# Patient Record
Sex: Female | Born: 1945
Health system: Southern US, Community
[De-identification: ages and names within clinical notes are randomized; demographics above are authoritative.]

## PROBLEM LIST (undated history)

## (undated) DIAGNOSIS — M75120 Complete rotator cuff tear or rupture of unspecified shoulder, not specified as traumatic: Secondary | ICD-10-CM

## (undated) DIAGNOSIS — G589 Mononeuropathy, unspecified: Secondary | ICD-10-CM

## (undated) DIAGNOSIS — G8929 Other chronic pain: Secondary | ICD-10-CM

## (undated) DIAGNOSIS — C801 Malignant (primary) neoplasm, unspecified: Secondary | ICD-10-CM

## (undated) DIAGNOSIS — M5030 Other cervical disc degeneration, unspecified cervical region: Secondary | ICD-10-CM

## (undated) DIAGNOSIS — M48062 Spinal stenosis, lumbar region with neurogenic claudication: Secondary | ICD-10-CM

## (undated) DIAGNOSIS — M533 Sacrococcygeal disorders, not elsewhere classified: Secondary | ICD-10-CM

## (undated) DIAGNOSIS — Z87440 Personal history of urinary (tract) infections: Secondary | ICD-10-CM

## (undated) DIAGNOSIS — J45909 Unspecified asthma, uncomplicated: Secondary | ICD-10-CM

## (undated) DIAGNOSIS — F329 Major depressive disorder, single episode, unspecified: Secondary | ICD-10-CM

## (undated) DIAGNOSIS — M47816 Spondylosis without myelopathy or radiculopathy, lumbar region: Secondary | ICD-10-CM

## (undated) DIAGNOSIS — M5136 Other intervertebral disc degeneration, lumbar region: Secondary | ICD-10-CM

## (undated) DIAGNOSIS — M19019 Primary osteoarthritis, unspecified shoulder: Secondary | ICD-10-CM

## (undated) DIAGNOSIS — R51 Headache: Secondary | ICD-10-CM

## (undated) DIAGNOSIS — F419 Anxiety disorder, unspecified: Secondary | ICD-10-CM

## (undated) DIAGNOSIS — M4810 Ankylosing hyperostosis [Forestier], site unspecified: Secondary | ICD-10-CM

## (undated) DIAGNOSIS — M79605 Pain in left leg: Secondary | ICD-10-CM

## (undated) DIAGNOSIS — M25511 Pain in right shoulder: Secondary | ICD-10-CM

## (undated) DIAGNOSIS — M5126 Other intervertebral disc displacement, lumbar region: Secondary | ICD-10-CM

## (undated) DIAGNOSIS — R0600 Dyspnea, unspecified: Secondary | ICD-10-CM

## (undated) DIAGNOSIS — K219 Gastro-esophageal reflux disease without esophagitis: Secondary | ICD-10-CM

## (undated) DIAGNOSIS — C3490 Malignant neoplasm of unspecified part of unspecified bronchus or lung: Secondary | ICD-10-CM

## (undated) DIAGNOSIS — F32A Depression, unspecified: Secondary | ICD-10-CM

## (undated) DIAGNOSIS — M1990 Unspecified osteoarthritis, unspecified site: Secondary | ICD-10-CM

## (undated) DIAGNOSIS — M79603 Pain in arm, unspecified: Secondary | ICD-10-CM

## (undated) DIAGNOSIS — M542 Cervicalgia: Secondary | ICD-10-CM

## (undated) DIAGNOSIS — M79604 Pain in right leg: Secondary | ICD-10-CM

## (undated) DIAGNOSIS — M19011 Primary osteoarthritis, right shoulder: Secondary | ICD-10-CM

## (undated) DIAGNOSIS — C189 Malignant neoplasm of colon, unspecified: Secondary | ICD-10-CM

## (undated) DIAGNOSIS — I10 Essential (primary) hypertension: Secondary | ICD-10-CM

## (undated) DIAGNOSIS — M4310 Spondylolisthesis, site unspecified: Secondary | ICD-10-CM

## (undated) DIAGNOSIS — R519 Headache, unspecified: Secondary | ICD-10-CM

## (undated) DIAGNOSIS — M4802 Spinal stenosis, cervical region: Secondary | ICD-10-CM

## (undated) HISTORY — PX: APPENDECTOMY: SHX54

## (undated) HISTORY — PX: ABDOMINAL HYSTERECTOMY: SHX81

## (undated) HISTORY — DX: Spinal stenosis, lumbar region with neurogenic claudication: M48.062

## (undated) HISTORY — DX: Pain in left leg: M79.605

## (undated) HISTORY — DX: Other cervical disc degeneration, unspecified cervical region: M50.30

## (undated) HISTORY — DX: Spondylosis without myelopathy or radiculopathy, lumbar region: M47.816

## (undated) HISTORY — DX: Other intervertebral disc displacement, lumbar region: M51.26

## (undated) HISTORY — PX: REDUCTION MAMMAPLASTY: SUR839

## (undated) HISTORY — DX: Spondylolisthesis, site unspecified: M43.10

## (undated) HISTORY — DX: Other intervertebral disc degeneration, lumbar region: M51.36

## (undated) HISTORY — DX: Pain in right leg: M79.604

## (undated) HISTORY — DX: Ankylosing hyperostosis (forestier), site unspecified: M48.10

## (undated) HISTORY — DX: Mononeuropathy, unspecified: G58.9

## (undated) HISTORY — DX: Sacrococcygeal disorders, not elsewhere classified: M53.3

## (undated) HISTORY — DX: Malignant neoplasm of colon, unspecified: C18.9

## (undated) HISTORY — DX: Complete rotator cuff tear or rupture of unspecified shoulder, not specified as traumatic: M75.120

## (undated) HISTORY — DX: Primary osteoarthritis, right shoulder: M19.011

## (undated) HISTORY — DX: Spinal stenosis, cervical region: M48.02

## (undated) HISTORY — DX: Pain in right shoulder: M25.511

## (undated) HISTORY — DX: Primary osteoarthritis, unspecified shoulder: M19.019

## (undated) HISTORY — DX: Pain in arm, unspecified: M79.603

## (undated) HISTORY — DX: Cervicalgia: M54.2

## (undated) HISTORY — PX: COLOSTOMY: SHX63

## (undated) HISTORY — PX: DILATION AND CURETTAGE OF UTERUS: SHX78

## (undated) HISTORY — PX: BREAST REDUCTION SURGERY: SHX8

## (undated) HISTORY — DX: Other chronic pain: G89.29

---
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
## 2000-11-12 ENCOUNTER — Ambulatory Visit (HOSPITAL_BASED_OUTPATIENT_CLINIC_OR_DEPARTMENT_OTHER): Admission: RE | Admit: 2000-11-12 | Discharge: 2000-11-12 | Payer: Self-pay | Admitting: Orthopedic Surgery

## 2001-01-10 ENCOUNTER — Ambulatory Visit (HOSPITAL_BASED_OUTPATIENT_CLINIC_OR_DEPARTMENT_OTHER): Admission: RE | Admit: 2001-01-10 | Discharge: 2001-01-10 | Payer: Self-pay | Admitting: Orthopedic Surgery

## 2003-09-03 ENCOUNTER — Other Ambulatory Visit: Payer: Self-pay

## 2004-10-20 ENCOUNTER — Emergency Department: Payer: Self-pay | Admitting: Emergency Medicine

## 2004-12-21 ENCOUNTER — Ambulatory Visit: Payer: Self-pay | Admitting: Internal Medicine

## 2005-07-14 ENCOUNTER — Other Ambulatory Visit: Payer: Self-pay

## 2005-07-14 ENCOUNTER — Emergency Department: Payer: Self-pay | Admitting: General Practice

## 2005-08-01 ENCOUNTER — Other Ambulatory Visit: Payer: Self-pay

## 2005-08-01 ENCOUNTER — Emergency Department: Payer: Self-pay | Admitting: Emergency Medicine

## 2005-12-07 ENCOUNTER — Ambulatory Visit: Payer: Self-pay | Admitting: Internal Medicine

## 2006-02-28 ENCOUNTER — Ambulatory Visit: Payer: Self-pay | Admitting: Pain Medicine

## 2006-03-26 ENCOUNTER — Other Ambulatory Visit: Payer: Self-pay

## 2006-03-26 ENCOUNTER — Emergency Department: Payer: Self-pay | Admitting: Internal Medicine

## 2006-03-26 ENCOUNTER — Ambulatory Visit: Payer: Self-pay | Admitting: Pain Medicine

## 2006-04-02 ENCOUNTER — Ambulatory Visit: Payer: Self-pay | Admitting: Pain Medicine

## 2006-06-02 ENCOUNTER — Emergency Department: Payer: Self-pay | Admitting: Emergency Medicine

## 2006-11-10 ENCOUNTER — Emergency Department: Payer: Self-pay | Admitting: General Practice

## 2007-02-06 ENCOUNTER — Ambulatory Visit: Payer: Self-pay | Admitting: Internal Medicine

## 2007-03-02 ENCOUNTER — Emergency Department: Payer: Self-pay | Admitting: Emergency Medicine

## 2007-03-02 ENCOUNTER — Other Ambulatory Visit: Payer: Self-pay

## 2007-05-22 ENCOUNTER — Other Ambulatory Visit: Payer: Self-pay

## 2007-05-22 ENCOUNTER — Emergency Department: Payer: Self-pay | Admitting: Emergency Medicine

## 2007-12-09 ENCOUNTER — Emergency Department: Payer: Self-pay | Admitting: Internal Medicine

## 2008-01-18 ENCOUNTER — Other Ambulatory Visit: Payer: Self-pay

## 2008-01-18 ENCOUNTER — Emergency Department: Payer: Self-pay | Admitting: Emergency Medicine

## 2008-02-12 ENCOUNTER — Ambulatory Visit: Payer: Self-pay | Admitting: Internal Medicine

## 2008-04-15 ENCOUNTER — Emergency Department: Payer: Self-pay | Admitting: Unknown Physician Specialty

## 2008-06-13 ENCOUNTER — Emergency Department: Payer: Self-pay | Admitting: Emergency Medicine

## 2008-06-15 ENCOUNTER — Emergency Department: Payer: Self-pay | Admitting: Emergency Medicine

## 2008-10-21 ENCOUNTER — Ambulatory Visit: Payer: Self-pay | Admitting: Internal Medicine

## 2008-11-08 ENCOUNTER — Ambulatory Visit: Payer: Self-pay | Admitting: Gastroenterology

## 2008-11-18 ENCOUNTER — Ambulatory Visit: Payer: Self-pay | Admitting: Gastroenterology

## 2009-02-15 ENCOUNTER — Ambulatory Visit: Payer: Self-pay | Admitting: Internal Medicine

## 2009-03-13 ENCOUNTER — Inpatient Hospital Stay: Payer: Self-pay | Admitting: Internal Medicine

## 2009-04-12 ENCOUNTER — Ambulatory Visit: Payer: Self-pay | Admitting: Surgery

## 2009-04-15 ENCOUNTER — Ambulatory Visit: Payer: Self-pay | Admitting: Surgery

## 2009-12-19 ENCOUNTER — Emergency Department: Payer: Self-pay | Admitting: Emergency Medicine

## 2010-02-27 ENCOUNTER — Ambulatory Visit: Payer: Self-pay | Admitting: Internal Medicine

## 2011-03-01 ENCOUNTER — Ambulatory Visit: Payer: Self-pay | Admitting: Internal Medicine

## 2011-08-04 ENCOUNTER — Emergency Department: Payer: Self-pay | Admitting: *Deleted

## 2012-03-03 ENCOUNTER — Ambulatory Visit: Payer: Self-pay | Admitting: Internal Medicine

## 2013-03-04 ENCOUNTER — Ambulatory Visit: Payer: Self-pay | Admitting: Internal Medicine

## 2014-06-03 ENCOUNTER — Ambulatory Visit: Payer: Self-pay | Admitting: Internal Medicine

## 2015-04-01 DIAGNOSIS — H538 Other visual disturbances: Secondary | ICD-10-CM | POA: Insufficient documentation

## 2015-04-01 DIAGNOSIS — R51 Headache: Secondary | ICD-10-CM | POA: Insufficient documentation

## 2015-04-01 DIAGNOSIS — I10 Essential (primary) hypertension: Secondary | ICD-10-CM | POA: Diagnosis present

## 2015-04-01 NOTE — ED Notes (Signed)
Pt states has blurred vision, headache, hypertension for a month. Pt states has also had intermittent chest pain mid sternally "off and on for awhile".

## 2015-04-02 ENCOUNTER — Other Ambulatory Visit: Payer: Self-pay

## 2015-04-02 ENCOUNTER — Emergency Department
Admission: EM | Admit: 2015-04-02 | Discharge: 2015-04-02 | Disposition: A | Discharge status: Home / Self Care | Payer: Medicare HMO | Attending: Emergency Medicine | Admitting: Emergency Medicine

## 2015-04-02 ENCOUNTER — Emergency Department: Payer: Medicare HMO

## 2015-04-02 DIAGNOSIS — I10 Essential (primary) hypertension: Secondary | ICD-10-CM

## 2015-04-02 LAB — CBC
HCT: 37.4 % (ref 35.0–47.0)
HEMOGLOBIN: 12.1 g/dL (ref 12.0–16.0)
MCH: 28 pg (ref 26.0–34.0)
MCHC: 32.5 g/dL (ref 32.0–36.0)
MCV: 86.2 fL (ref 80.0–100.0)
Platelets: 306 10*3/uL (ref 150–440)
RBC: 4.33 MIL/uL (ref 3.80–5.20)
RDW: 14.2 % (ref 11.5–14.5)
WBC: 10.8 10*3/uL (ref 3.6–11.0)

## 2015-04-02 LAB — BASIC METABOLIC PANEL
Anion gap: 8 (ref 5–15)
BUN: 16 mg/dL (ref 6–20)
CALCIUM: 9.4 mg/dL (ref 8.9–10.3)
CHLORIDE: 104 mmol/L (ref 101–111)
CO2: 28 mmol/L (ref 22–32)
Creatinine, Ser: 0.75 mg/dL (ref 0.44–1.00)
GFR calc Af Amer: >60 mL/min (ref >60)
GFR calc non Af Amer: >60 mL/min (ref >60)
Glucose, Bld: 142 mg/dL — ABNORMAL HIGH (ref 65–99)
POTASSIUM: 4.1 mmol/L (ref 3.5–5.1)
Sodium: 140 mmol/L (ref 135–145)

## 2015-04-02 LAB — TROPONIN I

## 2015-04-02 MED ORDER — CLONIDINE HCL 0.1 MG PO TABS
ORAL_TABLET | ORAL | Status: AC
  Administered 2015-04-02: 0.1 mg via ORAL
  Filled 2015-04-02: qty 1

## 2015-04-02 MED ORDER — CLONIDINE HCL 0.1 MG PO TABS
0.1000 mg | ORAL_TABLET | Freq: Once | ORAL | Status: AC
  Administered 2015-04-02: 0.1 mg via ORAL

## 2015-04-02 MED ORDER — KETOROLAC TROMETHAMINE 10 MG PO TABS
10.0000 mg | ORAL_TABLET | Freq: Once | ORAL | Status: AC
  Administered 2015-04-02: 10 mg via ORAL

## 2015-04-02 MED ORDER — KETOROLAC TROMETHAMINE 10 MG PO TABS
ORAL_TABLET | ORAL | Status: AC
  Administered 2015-04-02: 10 mg via ORAL
  Filled 2015-04-02: qty 1

## 2015-04-02 NOTE — ED Notes (Signed)
Pt in today for B/P check. Pt seen yesterday at PCP for same and no medications given. Pt denies any current symptoms.

## 2015-04-02 NOTE — ED Notes (Signed)
Report to [place], rn.

## 2015-04-02 NOTE — Discharge Instructions (Signed)

## 2015-04-02 NOTE — ED Provider Notes (Signed)
[facility] Emergency Department Provider Note  ____________________________________________  Time seen: 2:45 AM  I have reviewed the triage vital signs and the nursing notes.   HISTORY  Chief Complaint Hypertension      HPI Gloria Allen is a 69 y.o. female presents with blurred vision headache and hypertension for one month area patient states that she saw her primary care physician Dr. Brynda Greathouse yesterday for the same.     Past medical history Hypertension  There are no active problems to display for this patient.      Allergies No known drug allergies  Social History Tobacco use none  Review of Systems  Constitutional: Negative for fever. Eyes: Negative for visual changes. ENT: Negative for sore throat. Cardiovascular: Negative for chest pain. Respiratory: Negative for shortness of breath. Gastrointestinal: Negative for abdominal pain, vomiting and diarrhea. Genitourinary: Negative for dysuria. Musculoskeletal: Negative for back pain. Skin: Negative for rash. Neurological: Negative for headaches, focal weakness or numbness.   10-point ROS otherwise negative.  ____________________________________________   PHYSICAL EXAM:  VITAL SIGNS: ED Triage Vitals  Enc Vitals Group     BP 04/01/15 2355 217/88 mmHg     Pulse Rate 04/01/15 2355 75     Resp 04/01/15 2355 18     Temp 04/01/15 2355 98.6 F (37 C)     Temp Source 04/01/15 2355 Oral     SpO2 04/01/15 2355 96 %     Weight 04/01/15 2355 162 lb (73.483 kg)     Height 04/01/15 2355 '5\' 4"'$  (1.626 m)     Head Cir --      Peak Flow --      Pain Score 04/01/15 2356 8     Pain Loc --      Pain Edu? --      Excl. in [place]? --      Constitutional: Alert and oriented. Well appearing and in no distress. Eyes: Conjunctivae are normal. PERRL. Normal extraocular movements. ENT   Head: Normocephalic and atraumatic.   Nose: No congestion/rhinnorhea.   Mouth/Throat: Mucous  membranes are moist.   Neck: No stridor. Cardiovascular: Normal rate, regular rhythm. Normal and symmetric distal pulses are present in all extremities. No murmurs, rubs, or gallops. Respiratory: Normal respiratory effort without tachypnea nor retractions. Breath sounds are clear and equal bilaterally. No wheezes/rales/rhonchi. Gastrointestinal: Soft and nontender. No distention. There is no CVA tenderness. Genitourinary: deferred Musculoskeletal: Nontender with normal range of motion in all extremities. No joint effusions.  No lower extremity tenderness nor edema. Neurologic:  Normal speech and language. No gross focal neurologic deficits are appreciated. Speech is normal.  Skin:  Skin is warm, dry and intact. No rash noted. Psychiatric: Mood and affect are normal. Speech and behavior are normal. Patient exhibits appropriate insight and judgment.  ____________________________________________    LABS (pertinent positives/negatives)  Labs Reviewed  BASIC METABOLIC PANEL - Abnormal; Notable for the following:    Glucose, Bld 142 (*)    All other components within normal limits  CBC  TROPONIN I     ____________________________________________   EKG   Date: 04/02/2015  Rate: 78  Rhythm: normal sinus rhythm  QRS Axis: normal  Intervals: normal  ST/T Wave abnormalities: normal  Conduction Disutrbances: none  Narrative Interpretation: unremarkable      ____________________________________________    RADIOLOGY  CT scan of the head revealed  IMPRESSION: 1. No acute intracranial process. 2. Mild chronic small vessel ischemic disease.  INITIAL IMPRESSION / ASSESSMENT AND PLAN /  ED COURSE  Pertinent labs & imaging results that were available during my care of the patient were reviewed by me and considered in my medical decision making (see chart for details).  History of physical exam consistent with uncontrolled hypertension as such patient received clonidine 0.1  mg. With improvement in blood pressure patient will be discharged home with outpatient follow-up with Dr. Brynda Greathouse ____________________________________________   FINAL CLINICAL IMPRESSION(S) / ED DIAGNOSES  Final diagnoses:  Uncontrolled hypertension      Gregor Hams, MD 04/02/15 [phone]

## 2015-06-13 ENCOUNTER — Encounter: Payer: Self-pay | Admitting: Emergency Medicine

## 2015-06-13 ENCOUNTER — Emergency Department
Admission: EM | Admit: 2015-06-13 | Discharge: 2015-06-13 | Disposition: A | Discharge status: Home / Self Care | Payer: Medicare HMO | Attending: Emergency Medicine | Admitting: Emergency Medicine

## 2015-06-13 ENCOUNTER — Emergency Department: Payer: Medicare HMO

## 2015-06-13 DIAGNOSIS — W19XXXA Unspecified fall, initial encounter: Secondary | ICD-10-CM

## 2015-06-13 DIAGNOSIS — T148XXA Other injury of unspecified body region, initial encounter: Secondary | ICD-10-CM

## 2015-06-13 DIAGNOSIS — S6992XA Unspecified injury of left wrist, hand and finger(s), initial encounter: Secondary | ICD-10-CM | POA: Diagnosis present

## 2015-06-13 DIAGNOSIS — S8992XA Unspecified injury of left lower leg, initial encounter: Secondary | ICD-10-CM | POA: Insufficient documentation

## 2015-06-13 DIAGNOSIS — Y9389 Activity, other specified: Secondary | ICD-10-CM | POA: Diagnosis not present

## 2015-06-13 DIAGNOSIS — Y92009 Unspecified place in unspecified non-institutional (private) residence as the place of occurrence of the external cause: Secondary | ICD-10-CM | POA: Diagnosis not present

## 2015-06-13 DIAGNOSIS — W010XXA Fall on same level from slipping, tripping and stumbling without subsequent striking against object, initial encounter: Secondary | ICD-10-CM | POA: Insufficient documentation

## 2015-06-13 DIAGNOSIS — S3992XA Unspecified injury of lower back, initial encounter: Secondary | ICD-10-CM | POA: Diagnosis not present

## 2015-06-13 DIAGNOSIS — S59912A Unspecified injury of left forearm, initial encounter: Secondary | ICD-10-CM | POA: Insufficient documentation

## 2015-06-13 DIAGNOSIS — T148 Other injury of unspecified body region: Secondary | ICD-10-CM | POA: Diagnosis not present

## 2015-06-13 DIAGNOSIS — Y998 Other external cause status: Secondary | ICD-10-CM | POA: Diagnosis not present

## 2015-06-13 HISTORY — DX: Essential (primary) hypertension: I10

## 2015-06-13 MED ORDER — TRAMADOL HCL 50 MG PO TABS: 50.0000 mg | ORAL_TABLET | Freq: Four times a day (QID) | ORAL | Status: DC | PRN

## 2015-06-13 MED ORDER — ACETAMINOPHEN 500 MG PO TABS
1000.0000 mg | ORAL_TABLET | Freq: Once | ORAL | Status: DC
Start: 2015-06-13 — End: 2015-06-13

## 2015-06-13 NOTE — ED Provider Notes (Signed)
[facility] Emergency Department Provider Note     Time seen: ----------------------------------------- 1:59 PM on 06/13/2015 -----------------------------------------    I have reviewed the triage vital signs and the nursing notes.   HISTORY  Chief Complaint Fall    HPI Gloria Allen is a 69 y.o. female who presents ER after she fell at home. Patient complaining of pain in the left wrist, low back and left knee. Patient is able to ambulate, states she just tripped and fell. Fall happened just prior to arrival.   No past medical history on file.  There are no active problems to display for this patient.   No past surgical history on file.  Allergies Review of patient's allergies indicates no known allergies.  Social History Social History  Substance Use Topics  . Smoking status: Not on file  . Smokeless tobacco: Not on file  . Alcohol Use: Not on file    Review of Systems Musculoskeletal: Positive for left knee pain, low back pain, left wrist pain Skin: Negative for rash. Neurological: Negative for headaches, focal weakness or numbness.   ____________________________________________   PHYSICAL EXAM:  VITAL SIGNS: ED Triage Vitals  Enc Vitals Group     BP 06/13/15 1358 162/70 mmHg     Pulse Rate 06/13/15 1358 65     Resp 06/13/15 1358 18     Temp 06/13/15 1358 98.5 F (36.9 C)     Temp Source 06/13/15 1358 Oral     SpO2 06/13/15 1358 98 %     Weight 06/13/15 1358 162 lb (73.483 kg)     Height 06/13/15 1358 '5\' 4"'$  (1.626 m)     Head Cir --      Peak Flow --      Pain Score 06/13/15 1358 8     Pain Loc --      Pain Edu? --      Excl. in [place]? --     Constitutional: Alert and oriented. Well appearing and in no distress. Eyes: Conjunctivae are normal. PERRL. Normal extraocular movements. ENT   Head: Normocephalic and atraumatic.   Nose: No congestion/rhinnorhea.   Mouth/Throat: Mucous membranes are moist.    Neck: No stridor. Cardiovascular: Normal rate, regular rhythm. Normal and symmetric distal pulses are present in all extremities. No murmurs, rubs, or gallops. Respiratory: Normal respiratory effort without tachypnea nor retractions. Breath sounds are clear and equal bilaterally. No wheezes/rales/rhonchi. Gastrointestinal: Soft and nontender. No distention. No abdominal bruits.  Musculoskeletal: Mild pain with range of motion left wrist and forearm, left knee. There is focal tenderness in the lumbar sacral spine. Neurologic:  Normal speech and language. No gross focal neurologic deficits are appreciated. Speech is normal. No gait instability. Skin:  Skin is warm, dry and intact. No rash noted. Psychiatric: Mood and affect are normal. Speech and behavior are normal. Patient exhibits appropriate insight and judgment.  ____________________________________________  ED COURSE:  Pertinent labs & imaging results that were available during my care of the patient were reviewed by me and considered in my medical decision making (see chart for details). Patient is in no acute distress, will check basic x-rays. Fall appears to be mechanical ____________________________________________   RADIOLOGY Images were viewed by me  Left knee, left forearm, lumbosacral spine. IMPRESSION: 1. No fracture or acute finding. 2. Degenerative changes as detailed.  IMPRESSION: Normal plain film examination of the left knee. No fracture or Dislocation.  IMPRESSION:  negative ____________________________________________  FINAL ASSESSMENT AND PLAN  Fall, contusion, muscle  strain  Plan: Patient with labs and imaging as dictated above. Patient without acute bony fracture. He is stable for outpatient follow-up with Motrin or Tylenol as needed for pain   Earleen Newport, MD   Earleen Newport, MD 06/13/15 [phone]

## 2015-06-13 NOTE — Discharge Instructions (Signed)
Contusion A contusion is a deep bruise. Contusions are the result of an injury that caused bleeding under the skin. The contusion may turn blue, purple, or yellow. Minor injuries will give you a painless contusion, but more severe contusions may stay painful and swollen for a few weeks.  CAUSES  A contusion is usually caused by a blow, trauma, or direct force to an area of the body. SYMPTOMS   Swelling and redness of the injured area.  Bruising of the injured area.  Tenderness and soreness of the injured area.  Pain. DIAGNOSIS  The diagnosis can be made by taking a history and physical exam. An X-ray, CT scan, or MRI may be needed to determine if there were any associated injuries, such as fractures. TREATMENT  Specific treatment will depend on what area of the body was injured. In general, the best treatment for a contusion is resting, icing, elevating, and applying cold compresses to the injured area. Over-the-counter medicines may also be recommended for pain control. Ask your caregiver what the best treatment is for your contusion. HOME CARE INSTRUCTIONS   Put ice on the injured area.  Put ice in a plastic bag.  Place a towel between your skin and the bag.  Leave the ice on for 15-20 minutes, 3-4 times a day, or as directed by your health care provider.  Only take over-the-counter or prescription medicines for pain, discomfort, or fever as directed by your caregiver. Your caregiver may recommend avoiding anti-inflammatory medicines (aspirin, ibuprofen, and naproxen) for 48 hours because these medicines may increase bruising.  Rest the injured area.  If possible, elevate the injured area to reduce swelling. SEEK IMMEDIATE MEDICAL CARE IF:   You have increased bruising or swelling.  You have pain that is getting worse.  Your swelling or pain is not relieved with medicines. MAKE SURE YOU:   Understand these instructions.  Will watch your condition.  Will get help right  away if you are not doing well or get worse. Document Released: 06/27/2005 Document Revised: 09/22/2013 Document Reviewed: 07/23/2011 [facility] Patient Information 2015 [place], [place]. This information is not intended to replace advice given to you by your health care provider. Make sure you discuss any questions you have with your health care provider.  Fall Prevention and Home Safety Falls cause injuries and can affect all age groups. It is possible to use preventive measures to significantly decrease the likelihood of falls. There are many simple measures which can make your home safer and prevent falls. OUTDOORS  Repair cracks and edges of walkways and driveways.  Remove high doorway thresholds.  Trim shrubbery on the main path into your home.  Have good outside lighting.  Clear walkways of tools, rocks, debris, and clutter.  Check that handrails are not broken and are securely fastened. Both sides of steps should have handrails.  Have leaves, snow, and ice cleared regularly.  Use sand or salt on walkways during winter months.  In the garage, clean up grease or oil spills. BATHROOM  Install night lights.  Install grab bars by the toilet and in the tub and shower.  Use non-skid mats or decals in the tub or shower.  Place a plastic non-slip stool in the shower to sit on, if needed.  Keep floors dry and clean up all water on the floor immediately.  Remove soap buildup in the tub or shower on a regular basis.  Secure bath mats with non-slip, double-sided rug tape.  Remove throw rugs and tripping  hazards from the floors. BEDROOMS  Install night lights.  Make sure a bedside light is easy to reach.  Do not use oversized bedding.  Keep a telephone by your bedside.  Have a firm chair with side arms to use for getting dressed.  Remove throw rugs and tripping hazards from the floor. KITCHEN  Keep handles on pots and pans turned toward the center of the stove. Use  back burners when possible.  Clean up spills quickly and allow time for drying.  Avoid walking on wet floors.  Avoid hot utensils and knives.  Position shelves so they are not too high or low.  Place commonly used objects within easy reach.  If necessary, use a sturdy step stool with a grab bar when reaching.  Keep electrical cables out of the way.  Do not use floor polish or wax that makes floors slippery. If you must use wax, use non-skid floor wax.  Remove throw rugs and tripping hazards from the floor. STAIRWAYS  Never leave objects on stairs.  Place handrails on both sides of stairways and use them. Fix any loose handrails. Make sure handrails on both sides of the stairways are as long as the stairs.  Check carpeting to make sure it is firmly attached along stairs. Make repairs to worn or loose carpet promptly.  Avoid placing throw rugs at the top or bottom of stairways, or properly secure the rug with carpet tape to prevent slippage. Get rid of throw rugs, if possible.  Have an electrician put in a light switch at the top and bottom of the stairs. OTHER FALL PREVENTION TIPS  Wear low-heel or rubber-soled shoes that are supportive and fit well. Wear closed toe shoes.  When using a stepladder, make sure it is fully opened and both spreaders are firmly locked. Do not climb a closed stepladder.  Add color or contrast paint or tape to grab bars and handrails in your home. Place contrasting color strips on first and last steps.  Learn and use mobility aids as needed. Install an electrical emergency response system.  Turn on lights to avoid dark areas. Replace light bulbs that burn out immediately. Get light switches that glow.  Arrange furniture to create clear pathways. Keep furniture in the same place.  Firmly attach carpet with non-skid or double-sided tape.  Eliminate uneven floor surfaces.  Select a carpet pattern that does not visually hide the edge of  steps.  Be aware of all pets. OTHER HOME SAFETY TIPS  Set the water temperature for 120 F (48.8 C).  Keep emergency numbers on or near the telephone.  Keep smoke detectors on every level of the home and near sleeping areas. Document Released: 09/07/2002 Document Revised: 03/18/2012 Document Reviewed: 12/07/2011 [facility] Patient Information 2015 [place], [place]. This information is not intended to replace advice given to you by your health care provider. Make sure you discuss any questions you have with your health care provider.  Muscle Strain A muscle strain (pulled muscle) happens when a muscle is stretched beyond normal length. It happens when a sudden, violent force stretches your muscle too far. Usually, a few of the fibers in your muscle are torn. Muscle strain is common in athletes. Recovery usually takes 1-2 weeks. Complete healing takes 5-6 weeks.  HOME CARE   Follow the PRICE method of treatment to help your injury get better. Do this the first 2-3 days after the injury:  Protect. Protect the muscle to keep it from getting injured again.  Rest. Limit your activity and rest the injured body part.  Ice. Put ice in a plastic bag. Place a towel between your skin and the bag. Then, apply the ice and leave it on from 15-20 minutes each hour. After the third day, switch to moist heat packs.  Compression. Use a splint or elastic bandage on the injured area for comfort. Do not put it on too tightly.  Elevate. Keep the injured body part above the level of your heart.  Only take medicine as told by your doctor.  Warm up before doing exercise to prevent future muscle strains. GET HELP IF:   You have more pain or puffiness (swelling) in the injured area.  You feel numbness, tingling, or notice a loss of strength in the injured area. MAKE SURE YOU:   Understand these instructions.  Will watch your condition.  Will get help right away if you are not doing well or get  worse. Document Released: 06/26/2008 Document Revised: 07/08/2013 Document Reviewed: 04/16/2013 [facility] Patient Information 2015 [place], [place]. This information is not intended to replace advice given to you by your health care provider. Make sure you discuss any questions you have with your health care provider.
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
## 2015-07-19 ENCOUNTER — Emergency Department
Admission: EM | Admit: 2015-07-19 | Discharge: 2015-07-19 | Disposition: A | Discharge status: Home / Self Care | Payer: Medicare HMO | Attending: Emergency Medicine | Admitting: Emergency Medicine

## 2015-07-19 ENCOUNTER — Emergency Department: Payer: Medicare HMO

## 2015-07-19 DIAGNOSIS — S79911A Unspecified injury of right hip, initial encounter: Secondary | ICD-10-CM | POA: Insufficient documentation

## 2015-07-19 DIAGNOSIS — Y998 Other external cause status: Secondary | ICD-10-CM | POA: Diagnosis not present

## 2015-07-19 DIAGNOSIS — S39012A Strain of muscle, fascia and tendon of lower back, initial encounter: Secondary | ICD-10-CM | POA: Insufficient documentation

## 2015-07-19 DIAGNOSIS — Y9389 Activity, other specified: Secondary | ICD-10-CM | POA: Diagnosis not present

## 2015-07-19 DIAGNOSIS — S161XXA Strain of muscle, fascia and tendon at neck level, initial encounter: Secondary | ICD-10-CM | POA: Insufficient documentation

## 2015-07-19 DIAGNOSIS — S199XXA Unspecified injury of neck, initial encounter: Secondary | ICD-10-CM | POA: Diagnosis present

## 2015-07-19 DIAGNOSIS — I10 Essential (primary) hypertension: Secondary | ICD-10-CM | POA: Insufficient documentation

## 2015-07-19 DIAGNOSIS — Y92410 Unspecified street and highway as the place of occurrence of the external cause: Secondary | ICD-10-CM | POA: Insufficient documentation

## 2015-07-19 DIAGNOSIS — Z87891 Personal history of nicotine dependence: Secondary | ICD-10-CM | POA: Insufficient documentation

## 2015-07-19 DIAGNOSIS — Z79899 Other long term (current) drug therapy: Secondary | ICD-10-CM | POA: Insufficient documentation

## 2015-07-19 HISTORY — DX: Anxiety disorder, unspecified: F41.9

## 2015-07-19 MED ORDER — DIAZEPAM 5 MG PO TABS
5.0000 mg | ORAL_TABLET | Freq: Once | ORAL | Status: AC
  Administered 2015-07-19: 5 mg via ORAL
  Filled 2015-07-19: qty 1

## 2015-07-19 MED ORDER — TRAMADOL HCL 50 MG PO TABS: 50.0000 mg | ORAL_TABLET | Freq: Four times a day (QID) | ORAL | Status: AC | PRN

## 2015-07-19 MED ORDER — KETOROLAC TROMETHAMINE 60 MG/2ML IM SOLN
30.0000 mg | Freq: Once | INTRAMUSCULAR | Status: AC
  Administered 2015-07-19: 30 mg via INTRAMUSCULAR
  Filled 2015-07-19: qty 2

## 2015-07-19 NOTE — Discharge Instructions (Signed)
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
## 2015-07-19 NOTE — ED Notes (Signed)
AAOx3.  Skin warm and dry.  Moving all extremities equally and strong. Ambulates with easy and steady gait.
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
## 2016-02-16 ENCOUNTER — Emergency Department
Admission: EM | Admit: 2016-02-16 | Discharge: 2016-02-16 | Disposition: A | Discharge status: Home / Self Care | Payer: Medicare HMO | Attending: Emergency Medicine | Admitting: Emergency Medicine

## 2016-02-16 ENCOUNTER — Emergency Department: Payer: Medicare HMO

## 2016-02-16 DIAGNOSIS — M25511 Pain in right shoulder: Secondary | ICD-10-CM | POA: Diagnosis not present

## 2016-02-16 DIAGNOSIS — F419 Anxiety disorder, unspecified: Secondary | ICD-10-CM | POA: Diagnosis present

## 2016-02-16 DIAGNOSIS — Z87891 Personal history of nicotine dependence: Secondary | ICD-10-CM | POA: Diagnosis not present

## 2016-02-16 DIAGNOSIS — Z79899 Other long term (current) drug therapy: Secondary | ICD-10-CM | POA: Diagnosis not present

## 2016-02-16 DIAGNOSIS — I10 Essential (primary) hypertension: Secondary | ICD-10-CM | POA: Diagnosis not present

## 2016-02-16 MED ORDER — LORAZEPAM 1 MG PO TABS
1.0000 mg | ORAL_TABLET | Freq: Once | ORAL | Status: AC
  Administered 2016-02-16: 1 mg via ORAL
  Filled 2016-02-16: qty 1

## 2016-02-16 MED ORDER — OXYCODONE-ACETAMINOPHEN 5-325 MG PO TABS
1.0000 | ORAL_TABLET | Freq: Once | ORAL | Status: AC
  Administered 2016-02-16: 1 via ORAL
  Filled 2016-02-16: qty 1

## 2016-02-16 NOTE — ED Notes (Signed)
BEHAVIORAL HEALTH ROUNDING Patient sleeping: No. Patient alert and oriented: yes Behavior appropriate: Yes.  ; If no, describe:  Nutrition and fluids offered: yes Toileting and hygiene offered: Yes  Sitter present: q15 minute observations and security  monitoring Law enforcement present: Yes  ODS  

## 2016-02-16 NOTE — Discharge Instructions (Signed)
Joint Pain Joint pain, which is also called arthralgia, can be caused by many things. Joint pain often goes away when you follow your health care provider's instructions for relieving pain at home. However, joint pain can also be caused by conditions that require further treatment. Common causes of joint pain include:  Bruising in the area of the joint.  Overuse of the joint.  Wear and tear on the joints that occur with aging (osteoarthritis).  Various other forms of arthritis.  A buildup of a crystal form of uric acid in the joint (gout).  Infections of the joint (septic arthritis) or of the bone (osteomyelitis). Your health care provider may recommend medicine to help with the pain. If your joint pain continues, additional tests may be needed to diagnose your condition. HOME CARE INSTRUCTIONS Watch your condition for any changes. Follow these instructions as directed to lessen the pain that you are feeling.  Take medicines only as directed by your health care provider.  Rest the affected area for as long as your health care provider says that you should. If directed to do so, raise the painful joint above the level of your heart while you are sitting or lying down.  Do not do things that cause or worsen pain.  If directed, apply ice to the painful area:  Put ice in a plastic bag.  Place a towel between your skin and the bag.  Leave the ice on for 20 minutes, 2-3 times per day.  Wear an elastic bandage, splint, or sling as directed by your health care provider. Loosen the elastic bandage or splint if your fingers or toes become numb and tingle, or if they turn cold and blue.  Begin exercising or stretching the affected area as directed by your health care provider. Ask your health care provider what types of exercise are safe for you.  Keep all follow-up visits as directed by your health care provider. This is important. SEEK MEDICAL CARE IF:  Your pain increases, and medicine  does not help.  Your joint pain does not improve within 3 days.  You have increased bruising or swelling.  You have a fever.  You lose 10 lb (4.5 kg) or more without trying. SEEK IMMEDIATE MEDICAL CARE IF:  You are not able to move the joint.  Your fingers or toes become numb or they turn cold and blue.   This information is not intended to replace advice given to you by your health care provider. Make sure you discuss any questions you have with your health care provider.   Document Released: 09/17/2005 Document Revised: 10/08/2014 Document Reviewed: 06/29/2014 Elsevier Interactive Patient Education 2016 [employer]  Panic Attacks Panic attacks are sudden, short feelings of great fear or discomfort. You may have them for no reason when you are relaxed, when you are uneasy (anxious), or when you are sleeping.  HOME CARE  Take all your medicines as told.  Check with your doctor before starting new medicines.  Keep all doctor visits. GET HELP IF:  You are not able to take your medicines as told.  Your symptoms do not get better.  Your symptoms get worse. GET HELP RIGHT AWAY IF:  Your attacks seem different than your normal attacks.  You have thoughts about hurting yourself or others.  You take panic attack medicine and you have a side effect. MAKE SURE YOU:  Understand these instructions.  Will watch your condition.  Will get help right away if you are not  doing well or get worse.   This information is not intended to replace advice given to you by your health care provider. Make sure you discuss any questions you have with your health care provider.   Document Released: 10/20/2010 Document Revised: 07/08/2013 Document Reviewed: 05/01/2013 Elsevier Interactive Patient Education [employer].

## 2016-02-16 NOTE — ED Notes (Signed)
Pt arrives here from home with reports of a panic attack  EMS reports that she was hyperventilating with respirations around 60 when they arrived  Pt alert and oriented upon arrival here  She has calmed down and is talking in complete sentences

## 2016-02-16 NOTE — ED Notes (Signed)
Pt to be discharged to home - awaiting family to return to lobby so that they can drive her home

## 2016-02-16 NOTE — ED Notes (Signed)

## 2016-02-16 NOTE — ED Notes (Signed)
Lunch provided  - she has been talking with the pt in room 23  NAD observed  Anxiety level decreased

## 2016-02-16 NOTE — ED Notes (Signed)
Pt given warm blanket. Pt stated she does not want to hear security talking they are getting on her nerves. She wants her daughter to come back and I explained that the daughter would not be allowed back at this time. Pt could speak with RN about that.

## 2016-02-16 NOTE — ED Provider Notes (Signed)
[facility] Emergency Department Provider Note  Time seen: 10:25 AM  I have reviewed the triage vital signs and the nursing notes.   HISTORY  Chief Complaint Panic Attack    HPI Gloria Allen is a 70 y.o. female with a past medical history of hypertension, anxiety presents to the emergency department after a panic attack. According to the patient she had a bad dream that one of her friends had passed away. She awoke with a very panic sensation, hyperventilating, took her Klonopin but was unable to control her anxiety so she called EMS to be brought to the emergency department. Upon arrival the patient states she is feeling much better but still feels anxious. Patient's only medical complaint is right shoulder pain which is been ongoing for months but she has been unable to get in with her doctor for x-rays. Patient denies any recent falls or trauma. Patient denies any chest pain at any time. States the shortness of breath has resolved. States she gets panic attacks approximately once per week, but they are usually less severe and do not last as long. Patient sees her doctor who prescribes her Klonopin for anxiety, once a month.     Past Medical History  Diagnosis Date  . Hypertension   . Anxiety     There are no active problems to display for this patient.   Past Surgical History  Procedure Laterality Date  . Abdominal hysterectomy      Current Outpatient Rx  Name  Route  Sig  Dispense  Refill  . cloNIDine (CATAPRES) 0.2 MG tablet   Oral   Take 0.2 mg by mouth 2 (two) times daily.         . cyclobenzaprine (FLEXERIL) 10 MG tablet   Oral   Take 10 mg by mouth 3 (three) times daily as needed for muscle spasms.         . hydrALAZINE (APRESOLINE) 10 MG tablet   Oral   Take 10 mg by mouth 3 (three) times daily.         . magnesium oxide (MAG-OX) 400 MG tablet   Oral   Take 400 mg by mouth daily.         . mirtazapine (REMERON) 15 MG  tablet   Oral   Take 15 mg by mouth at bedtime.         . montelukast (SINGULAIR) 10 MG tablet   Oral   Take 10 mg by mouth at bedtime.         . nabumetone (RELAFEN) 750 MG tablet   Oral   Take 750 mg by mouth daily.         Marland Kitchen omeprazole (PRILOSEC OTC) 20 MG tablet   Oral   Take 20 mg by mouth daily.         . sertraline (ZOLOFT) 50 MG tablet   Oral   Take 50 mg by mouth daily.         Marland Kitchen terazosin (HYTRIN) 5 MG capsule   Oral   Take 5 mg by mouth at bedtime.         . traMADol (ULTRAM) 50 MG tablet   Oral   Take 1 tablet (50 mg total) by mouth every 6 (six) hours as needed.   12 tablet   0     Allergies Review of patient's allergies indicates no known allergies.  No family history on file.  Social History Social History  Substance Use Topics  .  Smoking status: Former Research scientist (life sciences)  . Smokeless tobacco: Not on file  . Alcohol Use: No    Review of Systems Constitutional: Negative for fever.Positive for anxiety which is improving per patient. Cardiovascular: Negative for chest pain. RespiratoryPositive for shortness of breath which is now resolved. Gastrointestinal: Negative for abdominal pain Musculoskeletal: Negative for back pain. Skin: Negative for rash. Neurological: Negative for headache 10-point ROS otherwise negative.  ____________________________________________   PHYSICAL EXAM:  VITAL SIGNS: ED Triage Vitals  Enc Vitals Group     BP 02/16/16 1005 190/91 mmHg     Pulse Rate 02/16/16 1005 90     Resp 02/16/16 1005 16     Temp 02/16/16 1005 98 F (36.7 C)     Temp Source 02/16/16 1005 Oral     SpO2 02/16/16 1005 98 %     Weight --      Height --      Head Cir --      Peak Flow --      Pain Score 02/16/16 1019 8     Pain Loc --      Pain Edu? --      Excl. in [place]? --    Constitutional: Alert and oriented. Well appearing and in no distress. Eyes: Normal exam ENT   Head: Normocephalic and atraumatic.   Mouth/Throat: Mucous  membranes are moist. Cardiovascular: Normal rate, regular rhythm. No murmur Respiratory: Normal respiratory effort without tachypnea nor retractions. Breath sounds are clear  Gastrointestinal: Soft and nontender. No distention.   Musculoskeletal: Mild to moderate tenderness palpation of the right shoulder with mild pain elicited during passive and active range of motion. Neurovascularly intact distally. Neurologic:  Normal speech and language. No gross focal neurologic deficits  Skin:  Skin is warm, dry and intact.  Psychiatric: Mildly anxious appearing. Patient is cooperative.  ____________________________________________   RADIOLOGY  X-ray shows no acute abnormality  ____________________________________________   INITIAL IMPRESSION / ASSESSMENT AND PLAN / ED COURSE  Pertinent labs & imaging results that were available during my care of the patient were reviewed by me and considered in my medical decision making (see chart for details).  The patient presents to the emergency department after likely panic attack. Patient states she is feeling better but still feels anxious. We'll dose 1 mg of Ativan orally. Patient does have moderate right shoulder tenderness palpation, good range of motion but with pain. Neurovascular intact distally. We will obtain x-rays in the emergency department. Patient admits she is feeling much better. We will monitor closely in the emergency department. I have offered the patient to speak to psychiatry, she states she strongly wishes not to speak to psychiatry and will follow up with her doctor.  X-ray negative. I have refer the patient orthopedics for further workup. I dosed the patient pain medication 1 in the emergency department. She states she is out of her home pain medications, I told the patient she would need to discuss this with the prescribing physician, she is agreeable.  ____________________________________________   FINAL CLINICAL IMPRESSION(S)  / ED DIAGNOSES  Right shoulder pain Anxiety   Harvest Dark, MD 02/16/16 1247

## 2016-04-07 ENCOUNTER — Emergency Department: Payer: No Typology Code available for payment source

## 2016-04-07 ENCOUNTER — Emergency Department
Admission: EM | Admit: 2016-04-07 | Discharge: 2016-04-07 | Disposition: A | Discharge status: Home / Self Care | Payer: No Typology Code available for payment source | Attending: Student | Admitting: Student

## 2016-04-07 ENCOUNTER — Encounter: Payer: Self-pay | Admitting: Emergency Medicine

## 2016-04-07 DIAGNOSIS — I10 Essential (primary) hypertension: Secondary | ICD-10-CM | POA: Diagnosis not present

## 2016-04-07 DIAGNOSIS — S20219A Contusion of unspecified front wall of thorax, initial encounter: Secondary | ICD-10-CM | POA: Diagnosis not present

## 2016-04-07 DIAGNOSIS — R51 Headache: Secondary | ICD-10-CM | POA: Insufficient documentation

## 2016-04-07 DIAGNOSIS — Z87891 Personal history of nicotine dependence: Secondary | ICD-10-CM | POA: Diagnosis not present

## 2016-04-07 DIAGNOSIS — Y999 Unspecified external cause status: Secondary | ICD-10-CM | POA: Insufficient documentation

## 2016-04-07 DIAGNOSIS — Y92410 Unspecified street and highway as the place of occurrence of the external cause: Secondary | ICD-10-CM | POA: Insufficient documentation

## 2016-04-07 DIAGNOSIS — Y9389 Activity, other specified: Secondary | ICD-10-CM | POA: Diagnosis not present

## 2016-04-07 DIAGNOSIS — Z79899 Other long term (current) drug therapy: Secondary | ICD-10-CM | POA: Insufficient documentation

## 2016-04-07 DIAGNOSIS — S63602A Unspecified sprain of left thumb, initial encounter: Secondary | ICD-10-CM | POA: Diagnosis not present

## 2016-04-07 DIAGNOSIS — S299XXA Unspecified injury of thorax, initial encounter: Secondary | ICD-10-CM | POA: Diagnosis present

## 2016-04-07 MED ORDER — TRAMADOL HCL 50 MG PO TABS: 50.0000 mg | ORAL_TABLET | Freq: Two times a day (BID) | ORAL | Status: DC | PRN

## 2016-04-07 MED ORDER — OXYCODONE-ACETAMINOPHEN 5-325 MG PO TABS
1.0000 | ORAL_TABLET | Freq: Once | ORAL | Status: AC
  Administered 2016-04-07: 1 via ORAL
  Filled 2016-04-07: qty 1

## 2016-04-07 MED ORDER — METHOCARBAMOL 500 MG PO TABS
500.0000 mg | ORAL_TABLET | Freq: Once | ORAL | Status: AC
  Administered 2016-04-07: 500 mg via ORAL
  Filled 2016-04-07: qty 1
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
## 2016-04-07 NOTE — Discharge Instructions (Signed)
Chest Contusion A contusion is a deep bruise. Bruises happen when an injury causes bleeding under the skin. Signs of bruising include pain, puffiness (swelling), and discolored skin. The bruise may turn blue, purple, or yellow.  HOME CARE  Put ice on the injured area.  Put ice in a plastic bag.  Place a towel between the skin and the bag.  Leave the ice on for 15-20 minutes at a time, 03-04 times a day for the first 48 hours.  Only take medicine as told by your doctor.  Rest.  Take deep breaths (deep-breathing exercises) as told by your doctor.  Stop smoking if you smoke.  Do not lift objects over 5 pounds (2.3 kilograms) for 3 days or longer if told by your doctor. GET HELP RIGHT AWAY IF:   You have more bruising or puffiness.  You have pain that gets worse.  You have trouble breathing.  You are dizzy, weak, or pass out (faint).  You have blood in your pee (urine) or poop (stool).  You cough up or throw up (vomit) blood.  Your puffiness or pain is not helped with medicines. MAKE SURE YOU:   Understand these instructions.  Will watch your condition.  Will get help right away if you are not doing well or get worse.   This information is not intended to replace advice given to you by your health care provider. Make sure you discuss any questions you have with your health care provider.   Document Released: 03/05/2008 Document Revised: 06/11/2012 Document Reviewed: 03/10/2012 Elsevier Interactive Patient Education [employer].
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
## 2016-04-07 NOTE — ED Notes (Signed)
Patient transported to CT 

## 2016-07-26 ENCOUNTER — Emergency Department
Admission: EM | Admit: 2016-07-26 | Discharge: 2016-07-26 | Disposition: A | Discharge status: Home / Self Care | Payer: Medicare HMO | Attending: Emergency Medicine | Admitting: Emergency Medicine

## 2016-07-26 ENCOUNTER — Emergency Department: Payer: Medicare HMO

## 2016-07-26 DIAGNOSIS — Z79899 Other long term (current) drug therapy: Secondary | ICD-10-CM | POA: Insufficient documentation

## 2016-07-26 DIAGNOSIS — I10 Essential (primary) hypertension: Secondary | ICD-10-CM | POA: Insufficient documentation

## 2016-07-26 DIAGNOSIS — Z87891 Personal history of nicotine dependence: Secondary | ICD-10-CM | POA: Diagnosis not present

## 2016-07-26 DIAGNOSIS — R0602 Shortness of breath: Secondary | ICD-10-CM | POA: Diagnosis not present

## 2016-07-26 LAB — CBC
HCT: 37.1 % (ref 35.0–47.0)
Hemoglobin: 12.3 g/dL (ref 12.0–16.0)
MCH: 28.3 pg (ref 26.0–34.0)
MCHC: 33.3 g/dL (ref 32.0–36.0)
MCV: 84.9 fL (ref 80.0–100.0)
Platelets: 250 10*3/uL (ref 150–440)
RBC: 4.36 MIL/uL (ref 3.80–5.20)
RDW: 14 % (ref 11.5–14.5)
WBC: 7.1 10*3/uL (ref 3.6–11.0)

## 2016-07-26 LAB — BASIC METABOLIC PANEL
Anion gap: 9 (ref 5–15)
BUN: 13 mg/dL (ref 6–20)
CALCIUM: 9.2 mg/dL (ref 8.9–10.3)
CO2: 26 mmol/L (ref 22–32)
CREATININE: 0.7 mg/dL (ref 0.44–1.00)
Chloride: 104 mmol/L (ref 101–111)
GFR calc Af Amer: >60 mL/min (ref >60)
GLUCOSE: 178 mg/dL — AB (ref 65–99)
Potassium: 3.9 mmol/L (ref 3.5–5.1)
Sodium: 139 mmol/L (ref 135–145)

## 2016-07-26 LAB — TROPONIN I

## 2016-07-26 MED ORDER — CLONIDINE HCL 0.1 MG PO TABS
0.2000 mg | ORAL_TABLET | Freq: Once | ORAL | Status: AC
  Administered 2016-07-26: 0.2 mg via ORAL

## 2016-07-26 MED ORDER — CLONIDINE HCL 0.1 MG PO TABS
ORAL_TABLET | ORAL | Status: AC
  Filled 2016-07-26: qty 2

## 2016-07-26 MED ORDER — ALBUTEROL SULFATE HFA 108 (90 BASE) MCG/ACT IN AERS: 2.0000 | INHALATION_SPRAY | Freq: Four times a day (QID) | RESPIRATORY_TRACT | 2 refills | Status: DC | PRN

## 2016-07-26 MED ORDER — PREDNISONE 20 MG PO TABS: 40.0000 mg | ORAL_TABLET | Freq: Every day | ORAL | 0 refills | Status: DC

## 2016-07-26 NOTE — ED Provider Notes (Signed)
Time Seen: Approximately1501 I have reviewed the triage notes  Chief Complaint: Shortness of Breath and Toxic Inhalation (mold?)   History of Present Illness: Gloria Allen is a 70 y.o. female who presents with symptoms over the past month of some occasional shortness of breath and some burning in her chest mostly at night. She states that she's been exposed to mold isn't concerned that she's had some "" lung damage "". She also presents bradycardic without any near syncope or lightheadedness. Third complaint is some low back pain which also apparently is been occurring for a significant time. Patient has not been in contact with her primary physician. She does not actively smoke. She denies any chest pain at this time and has no cardiovascular history other than hypertension. Pain or swelling or any pulmonary emboli risk factors.   Past Medical History:  Diagnosis Date  . Anxiety   . Hypertension     There are no active problems to display for this patient.   Past Surgical History:  Procedure Laterality Date  . ABDOMINAL HYSTERECTOMY      Past Surgical History:  Procedure Laterality Date  . ABDOMINAL HYSTERECTOMY      Current Outpatient Rx  . Order #: [id number] Class: Print  . Order #: [id number] Class: Historical Med  . Order #: [id number] Class: Historical Med  . Order #: [id number] Class: Historical Med  . Order #: [id number] Class: Historical Med  . Order #: [id number] Class: Historical Med  . Order #: [id number] Class: Historical Med  . Order #: [id number] Class: Historical Med  . Order #: [id number] Class: Historical Med  . Order #: [id number] Class: Print  . Order #: [id number] Class: Historical Med  . Order #: [id number] Class: Historical Med  . Order #: [id number] Class: Print    Allergies:  Review of patient's allergies indicates no known allergies.  Family History: No family history on file.  Social History: Social History  Substance Use Topics  . Smoking status: Former  Research scientist (life sciences)  . Smokeless tobacco: Never Used  . Alcohol use No     Review of Systems:   10 point review of systems was performed and was otherwise negative:  Constitutional: No fever Eyes: No visual disturbances ENT: No sore throat, ear pain Cardiac: No chest pain Respiratory: No shortness of breath, wheezing, or stridor Abdomen: No abdominal pain, no vomiting, No diarrhea Endocrine: No weight loss, No night sweats Extremities: No peripheral edema, cyanosis Skin: No rashes, easy bruising Neurologic: No focal weakness, trouble with speech or swollowing Urologic: No dysuria, Hematuria, or urinary frequency   Physical Exam:  ED Triage Vitals  Enc Vitals Group     BP 07/26/16 1136 (!) 159/52     Pulse Rate 07/26/16 1136 (!) 50     Resp 07/26/16 1136 20     Temp 07/26/16 1136 97.9 F (36.6 C)     Temp Source 07/26/16 1136 Oral     SpO2 07/26/16 1136 100 %     Weight 07/26/16 1137 153 lb (69.4 kg)     Height 07/26/16 1137 '5\' 4"'$  (1.626 m)     Head Circumference --      Peak Flow --      Pain Score 07/26/16 1146 8     Pain Loc --      Pain Edu? --      Excl. in [place]? --     General: Awake , Alert , and Oriented times 3; GCS 15 Respiratorydistress Head: Normal cephalic , atraumatic Eyes: Pupils equal , round, reactive to light Nose/Throat:  No nasal drainage, patent upper airway without erythema or exudate.  Neck: Supple, Full range of motion, No anterior adenopathy or palpable thyroid masses Lungs: Clear to ascultation without wheezes , rhonchi, or rales Heart: Bradycardic with a regular rhythm without murmurs , gallops , or rubs Abdomen: Soft, non tender without rebound, guarding , or rigidity; bowel sounds positive and symmetric in all 4 quadrants. No organomegaly .        Extremities: 2 plus symmetric pulses. No edema, clubbing or cyanosis Neurologic: normal ambulation, Motor symmetric without deficits, sensory intact Skin: warm, dry, no rashes   Labs:   All laboratory  work was reviewed including any pertinent negatives or positives listed below:  Labs Reviewed  BASIC METABOLIC PANEL - Abnormal; Notable for the following:       Result Value   Glucose, Bld 178 (*)    All other components within normal limits  CBC  TROPONIN I  Laboratory work was reviewed and showed no clinically significant abnormalities. Glucose is slightly elevated.  EKG:  ED ECG REPORT I, Daymon Larsen, the attending physician, personally viewed and interpreted this ECG.  Date: 07/26/2016 EKG Time: 1136 Rate: *49 Rhythm: normal sinus rhythm QRS Axis: normal Intervals: normal ST/T Wave abnormalities: normal Conduction Disturbances: none Narrative Interpretation: unremarkable Poor R-wave progression in the anterior leads without any acute ischemic changes   Radiology: * "Dg Chest 2 View  Result Date: 07/26/2016 CLINICAL DATA:  Dizziness.  Slow heart rate. EXAM: CHEST  2 VIEW COMPARISON:  04/07/2016 . FINDINGS: Mediastinum and hilar structures are normal. Stable cardiomegaly. Normal pulmonary vascularity. Mild left base pleural parenchymal thickening consistent with scarring. No acute infiltrate. No pleural effusion or pneumothorax. IMPRESSION: 1.  Mild cardiomegaly, no change.  No pulmonary venous congestion. 2. Mild right base pleural parenchymal thickening no acute abnormality identified. Consistent scarring. Electronically Signed   By: Marcello Moores  Register   On: 07/26/2016 12:47  "  I personally reviewed the radiologic studies     ED Course:  Patient's stay here was uneventful she does have some persistent bradycardic she seems to be asymptomatic at this time. Review of her medications does not show any beta blockers for rate reducing medication she'll need to follow-up with her primary physician with possible cardiology referral. Her to have a block or any signs of ischemia that's acute or has had elevated troponin with these symptoms occurring over the past month.  Symptoms seem respiratory in nature without any indications of a pulmonary embolism. Chest x-ray does not show any community-acquired pneumonia. I felt I would treat her for her main concern which seemed to be some mold exposure. She states she is working on getting a new place to live. Contact her primary physician as soon as possible for further outpatient treatment and evaluation. Felt there was no reason to hospitalize her at this point and advised her to follow up as directed. Her respiratory symptoms I prescribed her beta agonist inhaler which I felt would help with her bradycardia and also prednisone to help with the allergic exposure. Clinical Course     Assessment:  Shortness of breath   Final Clinical Impression:   Final diagnoses:  SOB (shortness of breath)     Plan: * Outpatient " New Prescriptions   ALBUTEROL (PROVENTIL HFA;VENTOLIN HFA) 108 (90 BASE) MCG/ACT INHALER    Inhale 2 puffs into the lungs every 6 (six) hours as needed for wheezing or shortness of breath.   PREDNISONE (DELTASONE) 20 MG TABLET  Take 2 tablets (40 mg total) by mouth daily.  " Patient was advised to return immediately if condition worsens. Patient was advised to follow up with their primary care physician or other specialized physicians involved in their outpatient care. The patient and/or family member/power of attorney had laboratory results reviewed at the bedside. All questions and concerns were addressed and appropriate discharge instructions were distributed by the nursing staff.             Daymon Larsen, MD 07/26/16 [phone]

## 2016-07-26 NOTE — Discharge Instructions (Signed)
Please return immediately if condition worsens. Please contact her primary physician or the physician you were given for referral. If you have any specialist physicians involved in her treatment and plan please also contact them. Thank you for using [facility]. ° °

## 2016-07-26 NOTE — ED Notes (Signed)
MD notified of HR.

## 2016-07-26 NOTE — ED Triage Notes (Signed)
Pt states she has been exposed to mold in her home.. Pt c/o SOB, burning in her chest, cough, congestion, "sick at night".Marland Kitchen

## 2016-10-19 ENCOUNTER — Encounter: Payer: Self-pay | Admitting: Emergency Medicine

## 2016-10-19 ENCOUNTER — Emergency Department: Payer: Medicare HMO

## 2016-10-19 ENCOUNTER — Emergency Department
Admission: EM | Admit: 2016-10-19 | Discharge: 2016-10-19 | Disposition: A | Discharge status: Home / Self Care | Payer: Medicare HMO | Attending: Emergency Medicine | Admitting: Emergency Medicine

## 2016-10-19 DIAGNOSIS — Z79899 Other long term (current) drug therapy: Secondary | ICD-10-CM | POA: Insufficient documentation

## 2016-10-19 DIAGNOSIS — I10 Essential (primary) hypertension: Secondary | ICD-10-CM | POA: Insufficient documentation

## 2016-10-19 DIAGNOSIS — R05 Cough: Secondary | ICD-10-CM | POA: Insufficient documentation

## 2016-10-19 DIAGNOSIS — Z87891 Personal history of nicotine dependence: Secondary | ICD-10-CM | POA: Diagnosis not present

## 2016-10-19 DIAGNOSIS — R059 Cough, unspecified: Secondary | ICD-10-CM

## 2016-10-19 LAB — URINALYSIS, ROUTINE W REFLEX MICROSCOPIC
BILIRUBIN URINE: NEGATIVE
Glucose, UA: NEGATIVE mg/dL
Hgb urine dipstick: NEGATIVE
KETONES UR: NEGATIVE mg/dL
Nitrite: NEGATIVE
Protein, ur: NEGATIVE mg/dL
SPECIFIC GRAVITY, URINE: 1.017 (ref 1.005–1.030)
pH: 5 (ref 5.0–8.0)

## 2016-10-19 MED ORDER — CLONAZEPAM 0.5 MG PO TABS
0.5000 mg | ORAL_TABLET | Freq: Once | ORAL | Status: AC
  Administered 2016-10-19: 0.5 mg via ORAL
  Filled 2016-10-19: qty 1

## 2016-10-19 MED ORDER — IPRATROPIUM-ALBUTEROL 0.5-2.5 (3) MG/3ML IN SOLN
3.0000 mL | Freq: Once | RESPIRATORY_TRACT | Status: AC
  Administered 2016-10-19: 3 mL via RESPIRATORY_TRACT
  Filled 2016-10-19: qty 3

## 2016-10-19 MED ORDER — BENZONATATE 100 MG PO CAPS: 100.0000 mg | ORAL_CAPSULE | Freq: Three times a day (TID) | ORAL | 0 refills | Status: DC | PRN

## 2016-10-19 MED ORDER — SULFAMETHOXAZOLE-TRIMETHOPRIM 800-160 MG PO TABS: 1.0000 | ORAL_TABLET | Freq: Two times a day (BID) | ORAL | 0 refills | Status: DC

## 2016-10-19 NOTE — ED Provider Notes (Signed)
[facility] Emergency Department Provider Note ____________________________________________  Time seen: 1857  I have reviewed the triage vital signs and the nursing notes.  HISTORY  Chief Complaint  Cough  HPI Gloria Allen is a 71 y.o. female presents to the ED for evaluation of productive cough for the last 2 days. Patient has been seen here several times for similar complaints of exposure in her home to black mold. She has been in the same house for 8 years. She reports today a black speckled sputum today. She denies fevers, chills, sweats, or nausea. She has been seen by her PCP, Dr. Brynda Greathouse for the same. She has not seen pulmonology or had her landlord test the air quality in the home.   Past Medical History:  Diagnosis Date  . Anxiety   . Hypertension     There are no active problems to display for this patient.  Past Surgical History:  Procedure Laterality Date  . ABDOMINAL HYSTERECTOMY      Prior to Admission medications   Medication Sig Start Date End Date Taking? Authorizing Provider  albuterol (PROVENTIL HFA;VENTOLIN HFA) 108 (90 Base) MCG/ACT inhaler Inhale 2 puffs into the lungs every 6 (six) hours as needed for wheezing or shortness of breath. 07/26/16   Daymon Larsen, MD  benzonatate (TESSALON PERLES) 100 MG capsule Take 1 capsule (100 mg total) by mouth 3 (three) times daily as needed for cough (Take 1-2 per dose). 10/19/16   Flordia Kassem V Bacon Arneta Mahmood, PA-C  cloNIDine (CATAPRES) 0.2 MG tablet Take 0.2 mg by mouth 2 (two) times daily.    Historical Provider, MD  cyclobenzaprine (FLEXERIL) 10 MG tablet Take 10 mg by mouth 3 (three) times daily as needed for muscle spasms.    Historical Provider, MD  hydrALAZINE (APRESOLINE) 10 MG tablet Take 10 mg by mouth 3 (three) times daily.    Historical Provider, MD  magnesium oxide (MAG-OX) 400 MG tablet Take 400 mg by mouth daily.    Historical Provider, MD  mirtazapine (REMERON) 15 MG tablet Take 15 mg by  mouth at bedtime.    Historical Provider, MD  montelukast (SINGULAIR) 10 MG tablet Take 10 mg by mouth at bedtime.    Historical Provider, MD  nabumetone (RELAFEN) 750 MG tablet Take 750 mg by mouth daily.    Historical Provider, MD  omeprazole (PRILOSEC OTC) 20 MG tablet Take 20 mg by mouth daily.    Historical Provider, MD  predniSONE (DELTASONE) 20 MG tablet Take 2 tablets (40 mg total) by mouth daily. 07/26/16   Daymon Larsen, MD  sertraline (ZOLOFT) 50 MG tablet Take 50 mg by mouth daily.    Historical Provider, MD  terazosin (HYTRIN) 5 MG capsule Take 5 mg by mouth at bedtime.    Historical Provider, MD  traMADol (ULTRAM) 50 MG tablet Take 1 tablet (50 mg total) by mouth every 12 (twelve) hours as needed for moderate pain. 04/07/16   Sable Feil, PA-C    Allergies Patient has no known allergies.  No family history on file.  Social History Social History  Substance Use Topics  . Smoking status: Former Research scientist (life sciences)  . Smokeless tobacco: Never Used  . Alcohol use No    Review of Systems  Constitutional: Negative for fever. Eyes: Negative for visual changes. ENT: Negative for sore throat. Cardiovascular: Negative for chest pain. Respiratory: Negative for shortness of breath. Reports cough as above.  Gastrointestinal: Negative for abdominal pain, vomiting and diarrhea. Musculoskeletal: Negative for back  pain. Skin: Negative for rash. Neurological: Negative for headaches, focal weakness or numbness. ____________________________________________  PHYSICAL EXAM:  VITAL SIGNS: ED Triage Vitals  Enc Vitals Group     BP 10/19/16 1721 (!) 177/88     Pulse Rate 10/19/16 1721 (!) 104     Resp 10/19/16 1721 16     Temp 10/19/16 1721 98.3 F (36.8 C)     Temp src --      SpO2 10/19/16 1721 100 %     Weight 10/19/16 1720 150 lb (68 kg)     Height 10/19/16 1720 '5\' 5"'$  (1.651 m)     Head Circumference --      Peak Flow --      Pain Score 10/19/16 1720 0     Pain Loc --      Pain  Edu? --      Excl. in [place]? --    Constitutional: Alert and oriented. Well appearing and in no distress. Head: Normocephalic and atraumatic. Eyes: Conjunctivae are normal. PERRL. Normal extraocular movements Ears: Canals clear. TMs intact bilaterally. Nose: No congestion/rhinorrhea/epistaxis. Mouth/Throat: Mucous membranes are moist. Upper full denture in place. Uvula midline, no oropharyngeal lesions.  Neck: Supple. No thyromegaly. Hematological/Lymphatic/Immunological: No cervical lymphadenopathy. Cardiovascular: Normal rate, regular rhythm. Normal distal pulses. Respiratory: Normal respiratory effort. No wheezes/rales/rhonchi. Gastrointestinal: Soft and nontender. No distention. Musculoskeletal: Nontender with normal range of motion in all extremities.  Neurologic:  Normal gait without ataxia. Normal speech and language. No gross focal neurologic deficits are appreciated. Skin:  Skin is warm, dry and intact. No rash noted. Psychiatric: Mood and affect are normal. Patient exhibits appropriate insight and judgment. ____________________________________________   LABS (pertinent positives/negatives) Labs Reviewed  URINALYSIS, ROUTINE W REFLEX MICROSCOPIC - Abnormal; Notable for the following:       Result Value   Color, Urine YELLOW (*)    APPearance CLEAR (*)    Leukocytes, UA MODERATE (*)    Bacteria, UA RARE (*)    Squamous Epithelial / LPF 0-5 (*)    All other components within normal limits  ___________________________________________   RADIOLOGY  CXR  IMPRESSION: 1. Small linear atelectasis at the left lateral lung base 2. No acute infiltrate 3. Stable mild cardiomegaly without overt failure ____________________________________________  PROCEDURES  DuoNeb x 1 Clonazepam 0.5 mg PO ____________________________________________  INITIAL IMPRESSION / ASSESSMENT AND PLAN / ED COURSE  Patient reports improvement in symptoms following and reassured by her normal CXR.  Patient with multiple visits for complaints of exposure black mold in the residence. She is found to have a normal chest x-ray and otherwise normal exam. Patient was discharged with a prescription for an albuterol inhaler for bronchospasm. She will follow with Dr. Brynda Greathouse as scheduled for routine of pain management and further follow-up care. She should consider evaluation by pulmonology at Dr. Jerene Dilling referral.  ____________________________________________  FINAL CLINICAL IMPRESSION(S) / ED DIAGNOSES  Final diagnoses:  Cough      Melvenia Needles, PA-C 10/19/16 2150    Dannielle Karvonen [place], PA-C 10/19/16 2152    Nena Polio, MD 10/22/16 0126

## 2016-10-19 NOTE — ED Notes (Signed)
Patient reports she was exposed to black mold at her home. Pt c/o productive cough, SOB, anorexia, fatigue, N/V. Patient reports her emesis appeared to be black mold, as does the sputum from her cough. Patient states: "I can't even stand to smell myself. I smell like black mold. Even my urine smells like black mold".   Patient reports similar occurrence last year that was treated with steroids.

## 2016-10-19 NOTE — Discharge Instructions (Signed)
Take the cough medicine as directed. Use the albuterol inhaler as needed. Follow-up with Dr. Brynda Greathouse next week for routine care.

## 2016-10-19 NOTE — ED Notes (Signed)
Reviewed prescription with pt. Pt verbalized understanding

## 2016-10-19 NOTE — ED Triage Notes (Signed)
Patient states she has been exposed to mold and for two days has been coughing productively, black sputum.  Seen through ED in the past for same.

## 2016-10-19 NOTE — ED Notes (Signed)
ED Provider at bedside. ED provider informed patient and RN that she will right patient RX for UTI.

## 2016-10-21 LAB — URINE CULTURE

## 2016-12-31 ENCOUNTER — Emergency Department
Admission: EM | Admit: 2016-12-31 | Discharge: 2016-12-31 | Disposition: A | Discharge status: Home / Self Care | Payer: Medicare HMO | Attending: Emergency Medicine | Admitting: Emergency Medicine

## 2016-12-31 ENCOUNTER — Encounter: Payer: Self-pay | Admitting: Emergency Medicine

## 2016-12-31 DIAGNOSIS — I10 Essential (primary) hypertension: Secondary | ICD-10-CM | POA: Diagnosis not present

## 2016-12-31 DIAGNOSIS — K0889 Other specified disorders of teeth and supporting structures: Secondary | ICD-10-CM | POA: Insufficient documentation

## 2016-12-31 DIAGNOSIS — R109 Unspecified abdominal pain: Secondary | ICD-10-CM | POA: Insufficient documentation

## 2016-12-31 DIAGNOSIS — Z79899 Other long term (current) drug therapy: Secondary | ICD-10-CM | POA: Diagnosis not present

## 2016-12-31 DIAGNOSIS — R51 Headache: Secondary | ICD-10-CM | POA: Insufficient documentation

## 2016-12-31 DIAGNOSIS — M546 Pain in thoracic spine: Secondary | ICD-10-CM | POA: Diagnosis not present

## 2016-12-31 DIAGNOSIS — M79662 Pain in left lower leg: Secondary | ICD-10-CM | POA: Diagnosis not present

## 2016-12-31 DIAGNOSIS — Z87891 Personal history of nicotine dependence: Secondary | ICD-10-CM | POA: Diagnosis not present

## 2016-12-31 DIAGNOSIS — R519 Headache, unspecified: Secondary | ICD-10-CM

## 2016-12-31 LAB — CBC
HEMATOCRIT: 37.9 % (ref 35.0–47.0)
HEMOGLOBIN: 12.4 g/dL (ref 12.0–16.0)
MCH: 28.3 pg (ref 26.0–34.0)
MCHC: 32.7 g/dL (ref 32.0–36.0)
MCV: 86.3 fL (ref 80.0–100.0)
Platelets: 334 10*3/uL (ref 150–440)
RBC: 4.39 MIL/uL (ref 3.80–5.20)
RDW: 14.8 % — ABNORMAL HIGH (ref 11.5–14.5)
WBC: 10.2 10*3/uL (ref 3.6–11.0)

## 2016-12-31 LAB — COMPREHENSIVE METABOLIC PANEL
ALBUMIN: 4.3 g/dL (ref 3.5–5.0)
ALT: 21 U/L (ref 14–54)
ANION GAP: 9 (ref 5–15)
AST: 30 U/L (ref 15–41)
Alkaline Phosphatase: 124 U/L (ref 38–126)
BILIRUBIN TOTAL: 0.4 mg/dL (ref 0.3–1.2)
BUN: 18 mg/dL (ref 6–20)
CHLORIDE: 106 mmol/L (ref 101–111)
CO2: 24 mmol/L (ref 22–32)
Calcium: 9.3 mg/dL (ref 8.9–10.3)
Creatinine, Ser: 0.51 mg/dL (ref 0.44–1.00)
GFR calc Af Amer: >60 mL/min (ref >60)
GFR calc non Af Amer: >60 mL/min (ref >60)
GLUCOSE: 265 mg/dL — AB (ref 65–99)
POTASSIUM: 3.8 mmol/L (ref 3.5–5.1)
Sodium: 139 mmol/L (ref 135–145)
TOTAL PROTEIN: 8 g/dL (ref 6.5–8.1)

## 2016-12-31 LAB — URINALYSIS, COMPLETE (UACMP) WITH MICROSCOPIC
BACTERIA UA: NONE SEEN
Bilirubin Urine: NEGATIVE
Glucose, UA: 150 mg/dL — AB
HGB URINE DIPSTICK: NEGATIVE
Ketones, ur: NEGATIVE mg/dL
LEUKOCYTES UA: NEGATIVE
NITRITE: NEGATIVE
PROTEIN: NEGATIVE mg/dL
Specific Gravity, Urine: 1.013 (ref 1.005–1.030)
Squamous Epithelial / LPF: NONE SEEN
WBC UA: NONE SEEN WBC/hpf (ref 0–5)
pH: 6 (ref 5.0–8.0)

## 2016-12-31 LAB — LIPASE, BLOOD: LIPASE: 11 U/L (ref 11–51)

## 2016-12-31 MED ORDER — ACETAMINOPHEN 500 MG PO TABS: 1000.0000 mg | ORAL_TABLET | Freq: Once | ORAL | Status: DC

## 2016-12-31 MED ORDER — ACETAMINOPHEN 325 MG PO TABS
650.0000 mg | ORAL_TABLET | Freq: Once | ORAL | Status: AC
  Administered 2016-12-31: 650 mg via ORAL
  Filled 2016-12-31: qty 2
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
## 2016-12-31 NOTE — ED Notes (Signed)
Pt. States "I have been here a couple times for mold".  Pt. States mold in house.  Pt. States "I am scared to stay at house"  Pt. States pain to lt. Side of body.  Pt. States she is taking percocet for pain.  Pt. States pain to tooth.  Pt. States "breathing in mold has caused pain to tooth".

## 2016-12-31 NOTE — ED Notes (Signed)
Spoke with Mel Almond from social work. Reports she will be down to speak with patient as soon as possible. Patient made aware.

## 2016-12-31 NOTE — Clinical Social Work Note (Signed)
Clinical Social Work Assessment  Patient Details  Name: Gloria Allen MRN: [id number] Date of Birth: 1946/09/19  Date of referral:  12/31/16               Reason for consult:  [employer], Housing Concerns/Homelessness                Permission sought to share information with:    Permission granted to share information::     Name::        Agency::     Relationship::     Contact Information:     Housing/Transportation Living arrangements for the past 2 months:  Single Family Home Source of Information:  Patient Patient Interpreter Needed:  None Criminal Activity/Legal Involvement Pertinent to Current Situation/Hospitalization:  No - Comment as needed Significant Relationships:  Adult Children Lives with:  Self Do you feel safe going back to the place where you live?    Need for family participation in patient care:  Yes (Comment)  Care giving concerns:  Patient lives alone in [place] in a house through the [employer].    Social Worker assessment / plan:  Holiday representative (CSW) received consult to provide patient with housing resources. Per RN patient will not be admitted to [facility] and will D/C from the ED today. CSW met with patient to provide resources. Upon CSW entering the room patient appeared angry and stated "what took you so long." CSW provided emotional support. Patient was alert and oriented X4 and was sitting up on the side of the bed. Patient reported that she believes she has mold in her house that is making her sick. Patient reported that she has called the [employer] and they sent an Agricultural consultant out that didn't find anything. Patient reported that she has no friends or family that she can stay with. Patient reported that she does not go to church and does not have a lot of support. Patient reported that she does have adult children however she would never reach out to them for help because she does not approve of their lifestyle. Patient  reported that she drives and is independent with her ADL's. Patient reported that she receives food stamps and has access to food and medication. 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 CSW explained that the only emergency housing would be [facility]. Patient stated that she is not going to the shelter "where crack heads stay." CSW gave patient a list of [facility] resources. CSW explained that patient can call DSS to see if they can assist. CSW also explained that patient can make a complaint about the [employer] to [place]. Patient reported that [facility] Human [facility] has been to the house before and did not follow up on anything. Patient reported that [employer] has gave her a list of other housing options however she stated that can't put a deposit down and pay her rent. CSW continued to provide emotional support. Patient reported that she would drive herself home today. RN aware of above. Please reconsult if future social work needs arise. CSW signing off.   Employment status:  Disabled (Comment on whether or not currently receiving Disability) Insurance information:  Managed Medicare PT Recommendations:  Not assessed at this time Information / Referral to community resources:  Other (Comment Required) [facility] )  Patient/Family's Response to care:  Patient was frustrated with her care at [facility]  and stated that the doctor asked why she came here because this is a place for sick people.   Patient/Family's Understanding of and Emotional Response to Diagnosis, Current Treatment, and Prognosis: Patietn appeared angry and refused to stay at [facility].   Emotional Assessment Appearance:  Appears stated age Attitude/Demeanor/Rapport:  Angry, Hostile Affect (typically observed):  Frustrated Orientation:  Oriented to Self, Oriented to Place, Oriented to  Time,  Oriented to Situation Alcohol / Substance use:  Not Applicable Psych involvement (Current and /or in the community):  No (Comment)  Discharge Needs  Concerns to be addressed:  No discharge needs identified Readmission within the last 30 days:  No Current discharge risk:  None Barriers to Discharge:  No Barriers Identified   Daylan Juhnke, Veronia Beets, LCSW 12/31/2016, 11:08 AM

## 2016-12-31 NOTE — ED Triage Notes (Signed)
Pt presents to triage with c/o mold, abdominal pain and burning upon urination as well as multiple other medical complaints. Pt has clear lung sounds bilaterally. Pt reports that she has lived in the same place for nine years and the mold has taken her bottom teeth, caused her to have headaches, and caused her to possibly have a urinary tract infection. Pt states that "I can't even say when this all started". Pt is in NAD at this time.

## 2016-12-31 NOTE — Discharge Instructions (Signed)
Please seek medical attention for any high fevers, chest pain, shortness of breath, change in behavior, persistent vomiting, bloody stool or any other new or concerning symptoms.  

## 2016-12-31 NOTE — ED Notes (Signed)
Pt states that "when I pee, it smells like that mold, when I poop, it smells like that mold".

## 2017-01-15 DIAGNOSIS — M19011 Primary osteoarthritis, right shoulder: Secondary | ICD-10-CM | POA: Insufficient documentation

## 2017-01-15 DIAGNOSIS — M19019 Primary osteoarthritis, unspecified shoulder: Secondary | ICD-10-CM

## 2017-01-15 HISTORY — DX: Primary osteoarthritis, unspecified shoulder: M19.019

## 2017-01-15 HISTORY — DX: Primary osteoarthritis, right shoulder: M19.011

## 2017-01-23 ENCOUNTER — Other Ambulatory Visit: Payer: Self-pay | Admitting: Physician Assistant

## 2017-01-23 DIAGNOSIS — M19011 Primary osteoarthritis, right shoulder: Secondary | ICD-10-CM

## 2017-01-31 ENCOUNTER — Emergency Department
Admission: EM | Admit: 2017-01-31 | Discharge: 2017-01-31 | Disposition: A | Discharge status: Home / Self Care | Payer: Medicare Other | Attending: Emergency Medicine | Admitting: Emergency Medicine

## 2017-01-31 ENCOUNTER — Emergency Department: Payer: Medicare Other

## 2017-01-31 DIAGNOSIS — I10 Essential (primary) hypertension: Secondary | ICD-10-CM | POA: Insufficient documentation

## 2017-01-31 DIAGNOSIS — Y9389 Activity, other specified: Secondary | ICD-10-CM | POA: Diagnosis not present

## 2017-01-31 DIAGNOSIS — S161XXA Strain of muscle, fascia and tendon at neck level, initial encounter: Secondary | ICD-10-CM | POA: Diagnosis not present

## 2017-01-31 DIAGNOSIS — G9389 Other specified disorders of brain: Secondary | ICD-10-CM | POA: Insufficient documentation

## 2017-01-31 DIAGNOSIS — Y92410 Unspecified street and highway as the place of occurrence of the external cause: Secondary | ICD-10-CM | POA: Diagnosis not present

## 2017-01-31 DIAGNOSIS — Y999 Unspecified external cause status: Secondary | ICD-10-CM | POA: Diagnosis not present

## 2017-01-31 DIAGNOSIS — Z79899 Other long term (current) drug therapy: Secondary | ICD-10-CM | POA: Diagnosis not present

## 2017-01-31 DIAGNOSIS — S3992XA Unspecified injury of lower back, initial encounter: Secondary | ICD-10-CM | POA: Diagnosis present

## 2017-01-31 DIAGNOSIS — S39012A Strain of muscle, fascia and tendon of lower back, initial encounter: Secondary | ICD-10-CM | POA: Diagnosis not present

## 2017-01-31 DIAGNOSIS — Z87891 Personal history of nicotine dependence: Secondary | ICD-10-CM | POA: Insufficient documentation

## 2017-01-31 LAB — URINALYSIS, COMPLETE (UACMP) WITH MICROSCOPIC
BACTERIA UA: NONE SEEN
BILIRUBIN URINE: NEGATIVE
Glucose, UA: 50 mg/dL — AB
Hgb urine dipstick: NEGATIVE
Ketones, ur: NEGATIVE mg/dL
Nitrite: NEGATIVE
PH: 5 (ref 5.0–8.0)
PROTEIN: 30 mg/dL — AB
RBC / HPF: NONE SEEN RBC/hpf (ref 0–5)
Specific Gravity, Urine: 1.013 (ref 1.005–1.030)

## 2017-01-31 MED ORDER — IBUPROFEN 400 MG PO TABS
400.0000 mg | ORAL_TABLET | Freq: Once | ORAL | Status: AC
  Administered 2017-01-31: 400 mg via ORAL

## 2017-01-31 MED ORDER — ACETAMINOPHEN 500 MG PO TABS
1000.0000 mg | ORAL_TABLET | Freq: Once | ORAL | Status: AC
  Administered 2017-01-31: 1000 mg via ORAL
  Filled 2017-01-31: qty 2

## 2017-01-31 MED ORDER — IBUPROFEN 400 MG PO TABS
ORAL_TABLET | ORAL | Status: AC
  Filled 2017-01-31: qty 1

## 2017-01-31 MED ORDER — CYCLOBENZAPRINE HCL 5 MG PO TABS: 5.0000 mg | ORAL_TABLET | Freq: Three times a day (TID) | ORAL | 0 refills | Status: DC | PRN

## 2017-01-31 MED ORDER — OXYCODONE HCL 5 MG PO TABS
5.0000 mg | ORAL_TABLET | Freq: Once | ORAL | Status: AC
  Administered 2017-01-31: 5 mg via ORAL
  Filled 2017-01-31: qty 1

## 2017-01-31 NOTE — ED Notes (Signed)
Removed c-collar per Dr. Alfred Levins verbal order

## 2017-01-31 NOTE — ED Notes (Signed)
Pt in scans via stretcher

## 2017-01-31 NOTE — ED Notes (Signed)
Pt given gown to change into for xray
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
## 2017-01-31 NOTE — ED Notes (Signed)
Pt. Verbalizes understanding of d/c instructions, prescriptions, and follow-up. VS stable.Pt. In NAD at time of d/c and denies further concerns regarding this visit. Pt. Stable at the time of departure from the unit, departing unit by the safest and most appropriate manner per that pt condition and limitations. Pt advised to return to the ED at any time for emergent concerns, or for new/worsening symptoms.   
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
## 2017-02-03 LAB — URINE CULTURE

## 2017-02-04 NOTE — Progress Notes (Signed)
ED Culture report from 01/31/2017 was reviewed by this pharmacist. PCP Dr. Brynda Greathouse was contacted - he will see the patient in his office this week and treat as needed - he asks that the ED take no further action regarding this culture result.   Dylin Breeden A. Hutchins, [place].D., BCPS Clinical Pharmacist 02/04/2017 15:20

## 2017-02-05 ENCOUNTER — Ambulatory Visit
Admission: RE | Admit: 2017-02-05 | Discharge: 2017-02-05 | Disposition: A | Discharge status: Home / Self Care | Payer: Medicare Other | Source: Ambulatory Visit | Attending: Physician Assistant | Admitting: Physician Assistant

## 2017-02-05 DIAGNOSIS — M75121 Complete rotator cuff tear or rupture of right shoulder, not specified as traumatic: Secondary | ICD-10-CM | POA: Diagnosis present

## 2017-02-05 DIAGNOSIS — M19011 Primary osteoarthritis, right shoulder: Secondary | ICD-10-CM | POA: Diagnosis present

## 2017-02-05 DIAGNOSIS — M6250 Muscle wasting and atrophy, not elsewhere classified, unspecified site: Secondary | ICD-10-CM | POA: Diagnosis not present

## 2017-03-04 ENCOUNTER — Other Ambulatory Visit: Payer: Self-pay | Admitting: Orthopedic Surgery

## 2017-03-13 MED ORDER — SILVER NITRATE-POT NITRATE 75-25 % EX MISC
CUTANEOUS | Status: AC
  Filled 2017-03-13: qty 1

## 2017-03-19 ENCOUNTER — Inpatient Hospital Stay
Admission: RE | Admit: 2017-03-19 | Discharge: 2017-03-19 | Disposition: A | Discharge status: Home / Self Care | Payer: Medicare Other | Source: Ambulatory Visit

## 2017-03-19 NOTE — Pre-Procedure Instructions (Signed)
ED EKG note from December 31, 2016  EKG  I, Nance Pear, attending physician, personally viewed and interpreted this EKG  EKG Time: 0345 Rate: 101 Rhythm: sinus tachycardia Axis: left axis deviation Intervals: qtc 453 QRS: narrow, q waves V1 ST changes: no st elevation Impression: abnormal ekg   ____________________________________________    RADIOLOGY  None  ____________________________________________   PROCEDURES  Procedures  ____________________________________________   INITIAL IMPRESSION / ASSESSMENT AND PLAN / ED COURSE  Pertinent labs & imaging results that were available during my care of the patient were reviewed by me and considered in my medical decision making (see chart for details).  Patient presented to the emergency department today with multiple medical complaints of chronic nature. Blood work and urine without any concerning findings. EKG without concerning findings. I do not feel that any other emergent workup is required at this time. Patient is requesting to speak to social work for housing information.

## 2017-03-21 ENCOUNTER — Encounter
Admission: RE | Admit: 2017-03-21 | Discharge: 2017-03-21 | Disposition: A | Discharge status: Home / Self Care | Payer: Medicare Other | Source: Ambulatory Visit | Attending: Orthopedic Surgery | Admitting: Orthopedic Surgery

## 2017-03-21 DIAGNOSIS — Z01818 Encounter for other preprocedural examination: Secondary | ICD-10-CM | POA: Insufficient documentation

## 2017-03-21 DIAGNOSIS — M75121 Complete rotator cuff tear or rupture of right shoulder, not specified as traumatic: Secondary | ICD-10-CM | POA: Diagnosis not present

## 2017-03-21 HISTORY — DX: Headache, unspecified: R51.9

## 2017-03-21 HISTORY — DX: Depression, unspecified: F32.A

## 2017-03-21 HISTORY — DX: Headache: R51

## 2017-03-21 HISTORY — DX: Unspecified osteoarthritis, unspecified site: M19.90

## 2017-03-21 HISTORY — DX: Personal history of urinary (tract) infections: Z87.440

## 2017-03-21 HISTORY — DX: Gastro-esophageal reflux disease without esophagitis: K21.9

## 2017-03-21 HISTORY — DX: Major depressive disorder, single episode, unspecified: F32.9

## 2017-03-21 HISTORY — DX: Dyspnea, unspecified: R06.00

## 2017-03-21 LAB — BASIC METABOLIC PANEL
ANION GAP: 9 (ref 5–15)
BUN: 13 mg/dL (ref 6–20)
CO2: 27 mmol/L (ref 22–32)
CREATININE: 0.68 mg/dL (ref 0.44–1.00)
Calcium: 9.9 mg/dL (ref 8.9–10.3)
Chloride: 104 mmol/L (ref 101–111)
GFR calc Af Amer: >60 mL/min (ref >60)
GLUCOSE: 116 mg/dL — AB (ref 65–99)
Potassium: 3.8 mmol/L (ref 3.5–5.1)
Sodium: 140 mmol/L (ref 135–145)

## 2017-03-21 LAB — CBC WITH DIFFERENTIAL/PLATELET
BASOS ABS: 0 10*3/uL (ref 0–0.1)
BASOS PCT: 0 %
Eosinophils Absolute: 0 10*3/uL (ref 0–0.7)
Eosinophils Relative: 0 %
HCT: 40.7 % (ref 35.0–47.0)
Hemoglobin: 13.4 g/dL (ref 12.0–16.0)
Lymphocytes Relative: 10 %
Lymphs Abs: 1.3 10*3/uL (ref 1.0–3.6)
MCH: 27.8 pg (ref 26.0–34.0)
MCHC: 32.9 g/dL (ref 32.0–36.0)
MCV: 84.7 fL (ref 80.0–100.0)
MONO ABS: 0.3 10*3/uL (ref 0.2–0.9)
MONOS PCT: 2 %
NEUTROS ABS: 11.3 10*3/uL — AB (ref 1.4–6.5)
Neutrophils Relative %: 88 %
PLATELETS: 382 10*3/uL (ref 150–440)
RBC: 4.8 MIL/uL (ref 3.80–5.20)
RDW: 14.4 % (ref 11.5–14.5)
WBC: 13 10*3/uL — ABNORMAL HIGH (ref 3.6–11.0)

## 2017-03-21 LAB — PROTIME-INR
INR: 1.06
Prothrombin Time: 13.8 seconds (ref 11.4–15.2)

## 2017-03-21 LAB — APTT: APTT: 28 s (ref 24–36)

## 2017-03-21 NOTE — Patient Instructions (Signed)
Your procedure is scheduled on: March 26, [address] [facility]) Report to Same Day Surgery 2nd floor medical mall ([place] Entrance-take elevator on left to 2nd floor.  Check in with surgery information desk.) To find out your arrival time please call [phone] between 1PM - 3PM on March 25, 2017 (MONDAY)     Remember: Instructions that are not followed completely may result in serious medical risk, up to and including death, or upon the discretion of your surgeon and anesthesiologist your surgery may need to be rescheduled.    _x___ 1. Do not eat food or drink liquids after midnight. No gum chewing or hard candies                               __x__ 2. No Alcohol for 24 hours before or after surgery.   __x__3. No Smoking for 24 prior to surgery.   ____  4. Bring all medications with you on the day of surgery if instructed.    __x__ 5. Notify your doctor if there is any change in your medical condition     (cold, fever, infections).     Do not wear jewelry, make-up, hairpins, clips or nail polish.  Do not wear lotions, powders, or perfumes.   Do not shave 48 hours prior to surgery. Men may shave face and neck.  Do not bring valuables to the hospital.    [facility] is not responsible for any belongings or valuables.               Contacts, dentures or bridgework may not be worn into surgery.  Leave your suitcase in the car. After surgery it may be brought to your room.  For patients admitted to the hospital, discharge time is determined by your treatment team                     Patients discharged the day of surgery will not be allowed to drive home.  You will need someone to drive you home and stay with you the night of your procedure.    Please read over the following fact sheets that you were given:   [facility] Preparing for Surgery and or MRSA Information   _x___ Take the following medications with a sip of water the morning of surgery :  1.AMLODIPINE  2 CLONAZEPAM  3.  CLONIDINE  4. OMEPRAZOLE (OMEPRAZOLE AT BEDTIME ON JUNE  25 )  6.  ____Fleets enema or Magnesium Citrate as directed.   _x___ Use CHG Soap or sage wipes as directed on instruction sheet   __x__ Use inhalers on the day of surgery and bring to hospital day of surgery (USE ALBUTEROL INHALER THE MORNING OF SURGERY AND BRING WITH YOU TO HOSPITAL)  ____ Stop Metformin and Janumet 2 days prior to surgery.    ____ Take 1/2 of usual insulin dose the night before surgery and none on the morning surgery        _x___ Follow recommendations from Cardiologist, Pulmonologist or PCP regarding          stopping Aspirin, Coumadin, Plavix ,Eliquis, Effient, or Pradaxa, and Pletal.  X____Stop Anti-inflammatories such as Advil, Aleve, Ibuprofen, Motrin, Naproxen, Naprosyn, Goodies powders or aspirin products. OK to take Tylenol    _x___ Stop supplements until after surgery.  But may continue Vitamin D, Vitamin B,and multivitamin       ____ Bring C-Pap to the hospital.

## 2017-03-25 MED ORDER — CEFAZOLIN SODIUM-DEXTROSE 2-4 GM/100ML-% IV SOLN
2.0000 g | INTRAVENOUS | Status: AC
  Administered 2017-03-26: 2 g via INTRAVENOUS

## 2017-03-26 ENCOUNTER — Ambulatory Visit: Payer: Medicare Other | Admitting: Anesthesiology

## 2017-03-26 ENCOUNTER — Encounter
Admission: RE | Disposition: A | Discharge status: Home / Self Care | Payer: Self-pay | Source: Ambulatory Visit | Attending: Orthopedic Surgery

## 2017-03-26 ENCOUNTER — Observation Stay
Admission: RE | Admit: 2017-03-26 | Discharge: 2017-03-27 | Disposition: A | Discharge status: Home / Self Care | Payer: Medicare Other | Source: Ambulatory Visit | Attending: Orthopedic Surgery | Admitting: Orthopedic Surgery

## 2017-03-26 DIAGNOSIS — Z791 Long term (current) use of non-steroidal anti-inflammatories (NSAID): Secondary | ICD-10-CM | POA: Insufficient documentation

## 2017-03-26 DIAGNOSIS — I10 Essential (primary) hypertension: Secondary | ICD-10-CM | POA: Diagnosis not present

## 2017-03-26 DIAGNOSIS — Z9889 Other specified postprocedural states: Secondary | ICD-10-CM

## 2017-03-26 DIAGNOSIS — F329 Major depressive disorder, single episode, unspecified: Secondary | ICD-10-CM | POA: Insufficient documentation

## 2017-03-26 DIAGNOSIS — M75121 Complete rotator cuff tear or rupture of right shoulder, not specified as traumatic: Principal | ICD-10-CM | POA: Insufficient documentation

## 2017-03-26 DIAGNOSIS — Z79891 Long term (current) use of opiate analgesic: Secondary | ICD-10-CM | POA: Diagnosis not present

## 2017-03-26 DIAGNOSIS — Z79899 Other long term (current) drug therapy: Secondary | ICD-10-CM | POA: Insufficient documentation

## 2017-03-26 DIAGNOSIS — K219 Gastro-esophageal reflux disease without esophagitis: Secondary | ICD-10-CM | POA: Insufficient documentation

## 2017-03-26 DIAGNOSIS — F419 Anxiety disorder, unspecified: Secondary | ICD-10-CM | POA: Insufficient documentation

## 2017-03-26 DIAGNOSIS — M19011 Primary osteoarthritis, right shoulder: Secondary | ICD-10-CM | POA: Insufficient documentation

## 2017-03-26 DIAGNOSIS — J45909 Unspecified asthma, uncomplicated: Secondary | ICD-10-CM | POA: Diagnosis not present

## 2017-03-26 DIAGNOSIS — Z87891 Personal history of nicotine dependence: Secondary | ICD-10-CM | POA: Insufficient documentation

## 2017-03-26 DIAGNOSIS — M25811 Other specified joint disorders, right shoulder: Secondary | ICD-10-CM | POA: Diagnosis not present

## 2017-03-26 HISTORY — PX: SHOULDER ARTHROSCOPY WITH OPEN ROTATOR CUFF REPAIR AND DISTAL CLAVICLE ACROMINECTOMY: SHX5683

## 2017-03-26 SURGERY — SHOULDER ARTHROSCOPY WITH OPEN ROTATOR CUFF REPAIR AND DISTAL CLAVICLE ACROMINECTOMY
Anesthesia: General | Laterality: Right

## 2017-03-26 MED ORDER — FENTANYL CITRATE (PF) 100 MCG/2ML IJ SOLN
INTRAMUSCULAR | Status: AC
  Administered 2017-03-26: 50 ug via INTRAVENOUS
  Filled 2017-03-26: qty 2

## 2017-03-26 MED ORDER — ROCURONIUM BROMIDE 100 MG/10ML IV SOLN
INTRAVENOUS | Status: DC | PRN
  Administered 2017-03-26 (×2): 10 mg via INTRAVENOUS
  Administered 2017-03-26: 35 mg via INTRAVENOUS
  Administered 2017-03-26: 5 mg via INTRAVENOUS

## 2017-03-26 MED ORDER — ONDANSETRON HCL 4 MG/2ML IJ SOLN: 4.0000 mg | Freq: Four times a day (QID) | INTRAMUSCULAR | Status: DC | PRN

## 2017-03-26 MED ORDER — SUGAMMADEX SODIUM 200 MG/2ML IV SOLN
INTRAVENOUS | Status: DC | PRN
  Administered 2017-03-26: 150 mg via INTRAVENOUS

## 2017-03-26 MED ORDER — EPHEDRINE SULFATE 50 MG/ML IJ SOLN
INTRAMUSCULAR | Status: AC
Start: 2017-03-26 — End: ?
  Filled 2017-03-26: qty 1

## 2017-03-26 MED ORDER — LIDOCAINE HCL (PF) 1 % IJ SOLN
INTRAMUSCULAR | Status: DC | PRN
  Administered 2017-03-26: 10 mL

## 2017-03-26 MED ORDER — ALBUTEROL SULFATE (2.5 MG/3ML) 0.083% IN NEBU: 2.5000 mg | INHALATION_SOLUTION | Freq: Four times a day (QID) | RESPIRATORY_TRACT | Status: DC | PRN

## 2017-03-26 MED ORDER — NEOMYCIN-POLYMYXIN B GU 40-200000 IR SOLN
Status: AC
  Filled 2017-03-26: qty 2

## 2017-03-26 MED ORDER — HYDROMORPHONE HCL 1 MG/ML IJ SOLN: 0.5000 mg | INTRAMUSCULAR | Status: DC | PRN

## 2017-03-26 MED ORDER — METHOCARBAMOL 500 MG PO TABS: 500.0000 mg | ORAL_TABLET | Freq: Four times a day (QID) | ORAL | Status: DC | PRN

## 2017-03-26 MED ORDER — TRAZODONE HCL 50 MG PO TABS
50.0000 mg | ORAL_TABLET | Freq: Every evening | ORAL | Status: DC | PRN
  Administered 2017-03-27: 50 mg via ORAL
  Filled 2017-03-26: qty 1

## 2017-03-26 MED ORDER — PANTOPRAZOLE SODIUM 40 MG PO TBEC
40.0000 mg | DELAYED_RELEASE_TABLET | Freq: Every day | ORAL | Status: DC
  Administered 2017-03-27: 40 mg via ORAL
  Filled 2017-03-26: qty 1

## 2017-03-26 MED ORDER — DIPHENHYDRAMINE HCL 12.5 MG/5ML PO ELIX: 12.5000 mg | ORAL_SOLUTION | ORAL | Status: DC | PRN

## 2017-03-26 MED ORDER — POLYETHYLENE GLYCOL 3350 17 G PO PACK: 17.0000 g | PACK | Freq: Every day | ORAL | Status: DC | PRN

## 2017-03-26 MED ORDER — ONDANSETRON HCL 4 MG/2ML IJ SOLN
INTRAMUSCULAR | Status: DC | PRN
  Administered 2017-03-26: 4 mg via INTRAVENOUS

## 2017-03-26 MED ORDER — ACETAMINOPHEN 10 MG/ML IV SOLN
INTRAVENOUS | Status: AC
Start: 2017-03-26 — End: ?
  Filled 2017-03-26: qty 100

## 2017-03-26 MED ORDER — BUPIVACAINE HCL (PF) 0.25 % IJ SOLN
INTRAMUSCULAR | Status: AC
  Filled 2017-03-26: qty 30

## 2017-03-26 MED ORDER — LIDOCAINE HCL (PF) 1 % IJ SOLN
INTRAMUSCULAR | Status: AC
  Filled 2017-03-26: qty 5

## 2017-03-26 MED ORDER — SUCCINYLCHOLINE CHLORIDE 20 MG/ML IJ SOLN
INTRAMUSCULAR | Status: DC | PRN
  Administered 2017-03-26: 100 mg via INTRAVENOUS

## 2017-03-26 MED ORDER — EPINEPHRINE 30 MG/30ML IJ SOLN
INTRAMUSCULAR | Status: AC
  Filled 2017-03-26: qty 1

## 2017-03-26 MED ORDER — MIDAZOLAM HCL 2 MG/2ML IJ SOLN
INTRAMUSCULAR | Status: AC
  Administered 2017-03-26: 1 mg via INTRAVENOUS
  Filled 2017-03-26: qty 2

## 2017-03-26 MED ORDER — ALUM & MAG HYDROXIDE-SIMETH 200-200-20 MG/5ML PO SUSP: 30.0000 mL | ORAL | Status: DC | PRN

## 2017-03-26 MED ORDER — CLONAZEPAM 0.5 MG PO TABS
1.0000 mg | ORAL_TABLET | Freq: Two times a day (BID) | ORAL | Status: DC
  Administered 2017-03-26 – 2017-03-27 (×2): 1 mg via ORAL
  Filled 2017-03-26 (×2): qty 2

## 2017-03-26 MED ORDER — ONDANSETRON HCL 4 MG PO TABS: 4.0000 mg | ORAL_TABLET | Freq: Four times a day (QID) | ORAL | Status: DC | PRN

## 2017-03-26 MED ORDER — EPHEDRINE SULFATE 50 MG/ML IJ SOLN
INTRAMUSCULAR | Status: DC | PRN
  Administered 2017-03-26: 5 mg via INTRAVENOUS
  Administered 2017-03-26: 10 mg via INTRAVENOUS
  Administered 2017-03-26 (×3): 5 mg via INTRAVENOUS

## 2017-03-26 MED ORDER — SUGAMMADEX SODIUM 200 MG/2ML IV SOLN
INTRAVENOUS | Status: AC
Start: 2017-03-26 — End: ?
  Filled 2017-03-26: qty 2

## 2017-03-26 MED ORDER — NEOMYCIN-POLYMYXIN B GU 40-200000 IR SOLN
Status: DC | PRN
  Administered 2017-03-26: 2 mL

## 2017-03-26 MED ORDER — OXYCODONE HCL 5 MG PO TABS
5.0000 mg | ORAL_TABLET | ORAL | Status: DC | PRN
  Administered 2017-03-26 (×2): 5 mg via ORAL
  Administered 2017-03-26 – 2017-03-27 (×5): 10 mg via ORAL
  Filled 2017-03-26: qty 2
  Filled 2017-03-26: qty 1
  Filled 2017-03-26 (×2): qty 2
  Filled 2017-03-26: qty 1
  Filled 2017-03-26 (×2): qty 2

## 2017-03-26 MED ORDER — SENNA 8.6 MG PO TABS
1.0000 | ORAL_TABLET | Freq: Two times a day (BID) | ORAL | Status: DC
  Administered 2017-03-26 – 2017-03-27 (×2): 8.6 mg via ORAL
  Filled 2017-03-26 (×2): qty 1

## 2017-03-26 MED ORDER — MIDAZOLAM HCL 2 MG/2ML IJ SOLN
1.0000 mg | Freq: Once | INTRAMUSCULAR | Status: AC
  Administered 2017-03-26: 1 mg via INTRAVENOUS

## 2017-03-26 MED ORDER — DEXAMETHASONE SODIUM PHOSPHATE 10 MG/ML IJ SOLN
INTRAMUSCULAR | Status: AC
  Filled 2017-03-26: qty 1

## 2017-03-26 MED ORDER — ALBUTEROL SULFATE HFA 108 (90 BASE) MCG/ACT IN AERS: 2.0000 | INHALATION_SPRAY | Freq: Four times a day (QID) | RESPIRATORY_TRACT | Status: DC | PRN

## 2017-03-26 MED ORDER — BISACODYL 10 MG RE SUPP: 10.0000 mg | Freq: Every day | RECTAL | Status: DC | PRN

## 2017-03-26 MED ORDER — DOCUSATE SODIUM 100 MG PO CAPS
100.0000 mg | ORAL_CAPSULE | Freq: Two times a day (BID) | ORAL | Status: DC
  Administered 2017-03-26 – 2017-03-27 (×2): 100 mg via ORAL
  Filled 2017-03-26 (×2): qty 1

## 2017-03-26 MED ORDER — EPINEPHRINE PF 1 MG/ML IJ SOLN
INTRAMUSCULAR | Status: DC | PRN
  Administered 2017-03-26: 1 mg

## 2017-03-26 MED ORDER — PHENYLEPHRINE HCL 10 MG/ML IJ SOLN
INTRAMUSCULAR | Status: DC | PRN
  Administered 2017-03-26 (×4): 100 ug via INTRAVENOUS
  Administered 2017-03-26: 50 ug via INTRAVENOUS

## 2017-03-26 MED ORDER — FENTANYL CITRATE (PF) 100 MCG/2ML IJ SOLN
INTRAMUSCULAR | Status: AC
  Administered 2017-03-26: 25 ug via INTRAVENOUS
  Filled 2017-03-26: qty 2

## 2017-03-26 MED ORDER — ROPIVACAINE HCL 5 MG/ML IJ SOLN
INTRAMUSCULAR | Status: AC
  Filled 2017-03-26: qty 30

## 2017-03-26 MED ORDER — ACETAMINOPHEN 650 MG RE SUPP: 650.0000 mg | Freq: Four times a day (QID) | RECTAL | Status: DC | PRN

## 2017-03-26 MED ORDER — CHLORHEXIDINE GLUCONATE CLOTH 2 % EX PADS: 6.0000 | MEDICATED_PAD | Freq: Once | CUTANEOUS | Status: DC

## 2017-03-26 MED ORDER — SUCCINYLCHOLINE CHLORIDE 20 MG/ML IJ SOLN
INTRAMUSCULAR | Status: AC
  Filled 2017-03-26: qty 1

## 2017-03-26 MED ORDER — FENTANYL CITRATE (PF) 100 MCG/2ML IJ SOLN
INTRAMUSCULAR | Status: AC
  Filled 2017-03-26: qty 2

## 2017-03-26 MED ORDER — ROCURONIUM BROMIDE 50 MG/5ML IV SOLN
INTRAVENOUS | Status: AC
  Filled 2017-03-26: qty 1

## 2017-03-26 MED ORDER — SODIUM CHLORIDE 0.9 % IJ SOLN
INTRAMUSCULAR | Status: AC
  Filled 2017-03-26: qty 10

## 2017-03-26 MED ORDER — TERAZOSIN HCL 5 MG PO CAPS
5.0000 mg | ORAL_CAPSULE | Freq: Every day | ORAL | Status: DC
  Administered 2017-03-26: 5 mg via ORAL
  Filled 2017-03-26 (×2): qty 1

## 2017-03-26 MED ORDER — ACETAMINOPHEN 325 MG PO TABS: 650.0000 mg | ORAL_TABLET | Freq: Four times a day (QID) | ORAL | Status: DC | PRN

## 2017-03-26 MED ORDER — PROMETHAZINE HCL 25 MG/ML IJ SOLN
INTRAMUSCULAR | Status: AC
Start: 2017-03-26 — End: 2017-03-26
  Administered 2017-03-26: 6.25 mg via INTRAVENOUS
  Filled 2017-03-26: qty 1

## 2017-03-26 MED ORDER — SODIUM CHLORIDE 0.9 % IV SOLN
INTRAVENOUS | Status: DC
  Administered 2017-03-26 – 2017-03-27 (×2): via INTRAVENOUS

## 2017-03-26 MED ORDER — ROPIVACAINE HCL 5 MG/ML IJ SOLN
INTRAMUSCULAR | Status: DC | PRN
  Administered 2017-03-26: 10 mL via PERINEURAL
  Administered 2017-03-26: 20 mL via PERINEURAL

## 2017-03-26 MED ORDER — CEFAZOLIN SODIUM-DEXTROSE 1-4 GM/50ML-% IV SOLN
1.0000 g | Freq: Four times a day (QID) | INTRAVENOUS | Status: AC
  Administered 2017-03-26 – 2017-03-27 (×3): 1 g via INTRAVENOUS
  Filled 2017-03-26 (×4): qty 50

## 2017-03-26 MED ORDER — MAGNESIUM CITRATE PO SOLN
1.0000 | Freq: Once | ORAL | Status: DC | PRN
  Filled 2017-03-26: qty 296

## 2017-03-26 MED ORDER — PROPOFOL 10 MG/ML IV BOLUS
INTRAVENOUS | Status: DC | PRN
  Administered 2017-03-26: 140 mg via INTRAVENOUS

## 2017-03-26 MED ORDER — PROMETHAZINE HCL 25 MG/ML IJ SOLN
6.2500 mg | Freq: Once | INTRAMUSCULAR | Status: AC
  Administered 2017-03-26: 6.25 mg via INTRAVENOUS

## 2017-03-26 MED ORDER — PHENOL 1.4 % MT LIQD
1.0000 | OROMUCOSAL | Status: DC | PRN
  Administered 2017-03-26: 1 via OROMUCOSAL
  Filled 2017-03-26 (×2): qty 177

## 2017-03-26 MED ORDER — LIDOCAINE HCL (PF) 1 % IJ SOLN
INTRAMUSCULAR | Status: AC
  Filled 2017-03-26: qty 30

## 2017-03-26 MED ORDER — PROPOFOL 10 MG/ML IV BOLUS
INTRAVENOUS | Status: AC
  Filled 2017-03-26: qty 20

## 2017-03-26 MED ORDER — ONDANSETRON HCL 4 MG/2ML IJ SOLN
INTRAMUSCULAR | Status: AC
  Filled 2017-03-26: qty 2

## 2017-03-26 MED ORDER — AMLODIPINE BESYLATE 10 MG PO TABS
10.0000 mg | ORAL_TABLET | Freq: Every day | ORAL | Status: DC
  Administered 2017-03-27: 10 mg via ORAL
  Filled 2017-03-26: qty 1

## 2017-03-26 MED ORDER — LACTATED RINGERS IV SOLN
INTRAVENOUS | Status: DC
  Administered 2017-03-26: 1000 mL via INTRAVENOUS
  Administered 2017-03-26: 12:00:00 via INTRAVENOUS

## 2017-03-26 MED ORDER — METHOCARBAMOL 1000 MG/10ML IJ SOLN
500.0000 mg | Freq: Four times a day (QID) | INTRAVENOUS | Status: DC | PRN
  Filled 2017-03-26: qty 5

## 2017-03-26 MED ORDER — OXYBUTYNIN CHLORIDE 5 MG PO TABS
5.0000 mg | ORAL_TABLET | Freq: Three times a day (TID) | ORAL | Status: DC
  Administered 2017-03-26 – 2017-03-27 (×3): 5 mg via ORAL
  Filled 2017-03-26 (×3): qty 1

## 2017-03-26 MED ORDER — LIDOCAINE HCL (CARDIAC) 20 MG/ML IV SOLN
INTRAVENOUS | Status: DC | PRN
  Administered 2017-03-26: 60 mg via INTRAVENOUS

## 2017-03-26 MED ORDER — BUPIVACAINE HCL 0.25 % IJ SOLN
INTRAMUSCULAR | Status: DC | PRN
  Administered 2017-03-26: 30 mL

## 2017-03-26 MED ORDER — MONTELUKAST SODIUM 10 MG PO TABS: 10.0000 mg | ORAL_TABLET | Freq: Every evening | ORAL | Status: DC | PRN

## 2017-03-26 MED ORDER — OXYCODONE HCL 5 MG/5ML PO SOLN: 5.0000 mg | Freq: Once | ORAL | Status: DC | PRN

## 2017-03-26 MED ORDER — MIDAZOLAM HCL 2 MG/2ML IJ SOLN
INTRAMUSCULAR | Status: AC
  Filled 2017-03-26: qty 2

## 2017-03-26 MED ORDER — LIDOCAINE HCL (PF) 1 % IJ SOLN
INTRAMUSCULAR | Status: DC | PRN
  Administered 2017-03-26: 1 mL via INTRADERMAL

## 2017-03-26 MED ORDER — ACETAMINOPHEN 10 MG/ML IV SOLN
INTRAVENOUS | Status: DC | PRN
  Administered 2017-03-26: 1000 mg via INTRAVENOUS

## 2017-03-26 MED ORDER — FENTANYL CITRATE (PF) 100 MCG/2ML IJ SOLN
INTRAMUSCULAR | Status: DC | PRN
  Administered 2017-03-26: 25 ug via INTRAVENOUS
  Administered 2017-03-26: 50 ug via INTRAVENOUS
  Administered 2017-03-26: 25 ug via INTRAVENOUS

## 2017-03-26 MED ORDER — SODIUM CHLORIDE 0.9 % IV SOLN
INTRAVENOUS | Status: DC | PRN
  Administered 2017-03-26: 20 ug/min via INTRAVENOUS

## 2017-03-26 MED ORDER — DEXAMETHASONE SODIUM PHOSPHATE 10 MG/ML IJ SOLN
INTRAMUSCULAR | Status: DC | PRN
  Administered 2017-03-26: 10 mg via INTRAVENOUS

## 2017-03-26 MED ORDER — FENTANYL CITRATE (PF) 100 MCG/2ML IJ SOLN
25.0000 ug | INTRAMUSCULAR | Status: DC | PRN
  Administered 2017-03-26: 25 ug via INTRAVENOUS

## 2017-03-26 MED ORDER — LIDOCAINE HCL (PF) 2 % IJ SOLN
INTRAMUSCULAR | Status: AC
  Filled 2017-03-26: qty 2

## 2017-03-26 MED ORDER — CLONIDINE HCL 0.1 MG PO TABS
0.2000 mg | ORAL_TABLET | Freq: Two times a day (BID) | ORAL | Status: DC
  Administered 2017-03-26 – 2017-03-27 (×2): 0.2 mg via ORAL
  Filled 2017-03-26 (×2): qty 2

## 2017-03-26 MED ORDER — ZOLPIDEM TARTRATE 5 MG PO TABS: 5.0000 mg | ORAL_TABLET | Freq: Every evening | ORAL | Status: DC | PRN

## 2017-03-26 MED ORDER — MIDAZOLAM HCL 2 MG/2ML IJ SOLN
INTRAMUSCULAR | Status: DC | PRN
  Administered 2017-03-26: 1 mg via INTRAVENOUS

## 2017-03-26 MED ORDER — MENTHOL 3 MG MT LOZG
1.0000 | LOZENGE | OROMUCOSAL | Status: DC | PRN
  Filled 2017-03-26: qty 9

## 2017-03-26 MED ORDER — OXYCODONE HCL 5 MG PO TABS: 5.0000 mg | ORAL_TABLET | Freq: Once | ORAL | Status: DC | PRN

## 2017-03-26 MED ORDER — FENTANYL CITRATE (PF) 100 MCG/2ML IJ SOLN
50.0000 ug | Freq: Once | INTRAMUSCULAR | Status: AC
  Administered 2017-03-26: 50 ug via INTRAVENOUS

## 2017-03-26 MED ORDER — SERTRALINE HCL 50 MG PO TABS
50.0000 mg | ORAL_TABLET | Freq: Every day | ORAL | Status: DC
  Administered 2017-03-26: 50 mg via ORAL
  Filled 2017-03-26: qty 1

## 2017-03-26 SURGICAL SUPPLY — 70 items
ADAPTER IRRIG TUBE 2 SPIKE SOL (ADAPTER) ×6 IMPLANT
ADPR TBG 2 SPK PMP STRL ASCP (ADAPTER) ×2
ANCH SUT Q-FX 2.8 (Anchor) ×2 IMPLANT
ANCHOR ALL-SUT Q-FIX 2.8 (Anchor) ×8 IMPLANT
BUR RADIUS 4.0X18.5 (BURR) ×3 IMPLANT
BUR RADIUS 5.5 (BURR) ×3 IMPLANT
CANISTER SUCT LVC 12 LTR MEDI- (MISCELLANEOUS) ×1 IMPLANT
CANNULA 5.75X7 CRYSTAL CLEAR (CANNULA) ×6 IMPLANT
CANNULA PARTIAL THREAD 2X7 (CANNULA) ×3 IMPLANT
CANNULA TWIST IN 8.25X9CM (CANNULA) ×6 IMPLANT
CLOSURE WOUND 1/2 X4 (GAUZE/BANDAGES/DRESSINGS) ×1
CONNECTOR PERFECT PASSER (CONNECTOR) ×4 IMPLANT
COOLER POLAR GLACIER W/PUMP (MISCELLANEOUS) ×3 IMPLANT
CRADLE LAMINECT ARM (MISCELLANEOUS) ×6 IMPLANT
DEVICE SUCT BLK HOLE OR FLOOR (MISCELLANEOUS) ×4 IMPLANT
DRAPE IMP U-DRAPE 54X76 (DRAPES) ×6 IMPLANT
DRAPE INCISE IOBAN 66X45 STRL (DRAPES) ×3 IMPLANT
DRAPE SHEET LG 3/4 BI-LAMINATE (DRAPES) ×3 IMPLANT
DRAPE U-SHAPE 47X51 STRL (DRAPES) ×3 IMPLANT
DURAPREP 26ML APPLICATOR (WOUND CARE) ×9 IMPLANT
ELECT REM PT RETURN 9FT ADLT (ELECTROSURGICAL) ×3
ELECTRODE REM PT RTRN 9FT ADLT (ELECTROSURGICAL) ×1 IMPLANT
GAUZE PETRO XEROFOAM 1X8 (MISCELLANEOUS) ×3 IMPLANT
GAUZE SPONGE 4X4 12PLY STRL (GAUZE/BANDAGES/DRESSINGS) ×3 IMPLANT
GLOVE BIOGEL PI IND STRL 9 (GLOVE) ×1 IMPLANT
GLOVE BIOGEL PI INDICATOR 9 (GLOVE) ×2
GLOVE SURG 9.0 ORTHO LTXF (GLOVE) ×9 IMPLANT
GOWN STRL REUS TWL 2XL XL LVL4 (GOWN DISPOSABLE) ×3 IMPLANT
GOWN STRL REUS W/ TWL LRG LVL3 (GOWN DISPOSABLE) ×1 IMPLANT
GOWN STRL REUS W/TWL LRG LVL3 (GOWN DISPOSABLE) ×3
IV LACTATED RINGER IRRG 3000ML (IV SOLUTION) ×33
IV LR IRRIG 3000ML ARTHROMATIC (IV SOLUTION) ×8 IMPLANT
KIT RM TURNOVER STRD PROC AR (KITS) ×3 IMPLANT
KIT STABILIZATION SHOULDER (MISCELLANEOUS) ×3 IMPLANT
KIT SUTURE 2.8 Q-FIX DISP (MISCELLANEOUS) ×3 IMPLANT
KIT SUTURETAK 3.0 INSERT PERC (KITS) IMPLANT
MANIFOLD NEPTUNE II (INSTRUMENTS) ×5 IMPLANT
MASK FACE SPIDER DISP (MASK) ×3 IMPLANT
MAT BLUE FLOOR 46X72 FLO (MISCELLANEOUS) ×7 IMPLANT
NDL SAFETY 18GX1.5 (NEEDLE) ×3 IMPLANT
NDL SAFETY 22GX1.5 (NEEDLE) ×3 IMPLANT
NS IRRIG 500ML POUR BTL (IV SOLUTION) ×3 IMPLANT
PACK ARTHROSCOPY SHOULDER (MISCELLANEOUS) ×3 IMPLANT
PAD WRAPON POLAR SHDR XLG (MISCELLANEOUS) ×1 IMPLANT
PASSER SUT CAPTURE FIRST (SUTURE) ×3 IMPLANT
SET TUBE SUCT SHAVER OUTFL 24K (TUBING) ×3 IMPLANT
SET TUBE TIP INTRA-ARTICULAR (MISCELLANEOUS) ×3 IMPLANT
STAPLER SKIN PROX 35W (STAPLE) ×2 IMPLANT
STRIP CLOSURE SKIN 1/2X4 (GAUZE/BANDAGES/DRESSINGS) ×2 IMPLANT
SUT ETHILON 4-0 (SUTURE) ×3
SUT ETHILON 4-0 FS2 18XMFL BLK (SUTURE) ×1
SUT KNTLS 2.8 MAGNUM (Anchor) ×10 IMPLANT
SUT LASSO 90 DEG SD STR (SUTURE) IMPLANT
SUT MNCRL 4-0 (SUTURE) ×3
SUT MNCRL 4-0 27XMFL (SUTURE) ×1
SUT PDS AB 0 CT1 27 (SUTURE) ×3 IMPLANT
SUT PERFECTPASSER WHITE CART (SUTURE) ×6 IMPLANT
SUT SMART STITCH CARTRIDGE (SUTURE) ×8 IMPLANT
SUT VIC AB 0 CT1 36 (SUTURE) ×5 IMPLANT
SUT VIC AB 2-0 CT2 27 (SUTURE) ×3 IMPLANT
SUTURE ETHLN 4-0 FS2 18XMF BLK (SUTURE) ×1 IMPLANT
SUTURE MAGNUM WIRE 2X48 BLK (SUTURE) ×6 IMPLANT
SUTURE MNCRL 4-0 27XMF (SUTURE) ×1 IMPLANT
SYRINGE 10CC LL (SYRINGE) ×3 IMPLANT
TAPE MICROFOAM 4IN (TAPE) ×3 IMPLANT
TUBING ARTHRO INFLOW-ONLY STRL (TUBING) ×3 IMPLANT
TUBING CONNECTING 10 (TUBING) ×2 IMPLANT
TUBING CONNECTING 10' (TUBING) ×1
WAND HAND CNTRL MULTIVAC 90 (MISCELLANEOUS) ×3 IMPLANT
WRAPON POLAR PAD SHDR XLG (MISCELLANEOUS) ×3

## 2017-03-26 NOTE — Op Note (Signed)
03/26/2017  11:13 AM  PATIENT:  Gloria Allen  71 y.o. female  PRE-OPERATIVE DIAGNOSIS:  Full thickness rotator cuff tear of the right shoulder with subacromial impingement and acromioclavicular joint arthrosis  POST-OPERATIVE DIAGNOSIS:  Same  PROCEDURE:  Procedure(s): right shoulder arthroscopy, arthroscopic subacromial decompression, distal clavicle excision, mini open rotator cuff repair (Right)  SURGEON:  Surgeon(s) and Role:    Thornton Park, MD - Primary  ANESTHESIA:   local, general and paracervical block   PREOPERATIVE INDICATIONS:  KYLANI WIRES is a  71 y.o. female with a diagnosis of full thickness tear of the right rotator cuff who failed conservative treatment and elected for surgical management.    The risks benefits and alternatives were discussed with the patient preoperatively including but not limited to the risks of infection, bleeding, nerve injury, persistent pain or weakness, failure of the hardware, re-tear of the rotator cuff and the need for further surgery. Medical risks include DVT and pulmonary embolism, myocardial infarction, stroke, pneumonia, respiratory failure and death. Patient understood these risks and wished to proceed.  OPERATIVE IMPLANTS: ArthroCare Magnum 2 anchors x 3 & Smith and Nephew Q Fix anchors x 2  OPERATIVE FINDINGS: Full-thickness rotator cuff tear involving the supra and infraspinatus. There is extensive fraying of the supraspinatus tendon. Patient had fraying of the labrum but no overt tear.  No focal chondral lesions in the glenohumeral joint. Patient had advanced degenerative changes in the acromioclavicular joint as well as large subacromial spurs.  OPERATIVE PROCEDURE: The patient was met in the preoperative area. The right shoulder was signed with the word yes and my initials according the hospital's correct site of surgery protocol.   History and physical was updated.   Patient underwent an interscalene block by the anesthesia  service in the preoperative area.  Patient was brought to the operating room where she underwent general endotracheal intubation.  The patient was placed in a beachchair position.  A spider arm positioner was used for this case. Examination under anesthesia revealed no limitation of motion or instability with load shift testing. The patient had a negative sulcus sign.  Patient was prepped and draped in a sterile fashion. A timeout was performed to verify the patient's name, date of birth, medical record number, correct site of surgery and correct procedure to be performed there was also used to verify the patient received antibiotics that all appropriate instruments, implants and radiographs studies were available in the room. Once all in attendance were in agreement case began.  Bony landmarks were drawn out with a surgical marker along with proposed arthroscopy incisions. These were pre-injected with 1% lidocaine plain. An 11 blade was used to establish a posterior portal through which the arthroscope was placed in the glenohumeral joint. A full diagnostic examination of the shoulder was performed.  Findings are listed above.  The arthroscope was then placed in the subacromial space.  Extensive bursitis was encountered and debrided using a 4-0 resector shaver blade and a 90 ArthroCare wand from a lateral portal which was established under direct visualization using an 18-gauge spinal needle. A subacromial decompression was also performed using a 5.5 mm resector shaver blade from the lateral portal.  Three Perfect Pass sutures were placed in the rotator cuff tear. An arthroscopic elevator was used to mobilize the rotator cuff. All arthroscopic instruments were then removed and the mini-open portion of the procedure began.  A saber-type incision was made along the lateral border of the acromion. The deltoid muscle  was identified and split in line with its fibers which allowed visualization of the rotator  cuff. The Perfect Pass sutures previously placed in the  rotator cuff were brought out through the deltoid split.  A 5.5 mm resector shaver blade was then used to debride the greater tuberosity of all torn fibers of the rotator cuff.  The infraspinatus tendon was released from the greater tuberosity and 2 additional perfect Pass sutures were placed in the lateral border of the infraspinatus.  Two [employer] anchor was placed into the humeral head under the infraspinatus, one medial and one lateral. The suture limbs of the Q Fix anchor were passed through the infraspinatus using a First Pass suture passer.   Three Perfect Pass sutures in were then anchored to the anterior portion of the greater tuberosity with three Magnum 2 anchors. These anchors were tensioned to allow for advancement of the infraspinatus to the anterior portion of the greater tuberosity. A side-to-side suture was placed medially to eliminate the small gap in the repair. Arthroscopic images of the repair were taken with the arthroscope both externally and from inside the glenohumeral joint.  All incisions were copiously irrigated. The deltoid fascia was repaired using a 0 Vicryl suture.  The subcutaneous tissue of all incisions were closed with a 2-0 Vicryl. Skin closure for the arthroscopic incisions was performed with 4-0 nylon. The skin edges of the saber incision was approximated with a running 4-0 undyed Monocryl.  0.25% Marcaine plain was then injected into the subacromial space for postoperative pain control. A dry sterile dressing was applied.  The patient was placed in an abduction sling and a Polar Care was applied to the shoulder.  All sharp and it instrument counts were correct at the conclusion of the case. I was scrubbed and present for the entire case.

## 2017-03-26 NOTE — Progress Notes (Signed)
Phenergan 6.25 given for nausea

## 2017-03-26 NOTE — Progress Notes (Signed)
[facility] responded to an OR for prayer and met with pt. Pt was lying on bed eating food. Pt talked about her health struggles, complained about treatment she received from a particular dc in this hospital, talked about her disabled son and dysfunctional daughter, her problem with mold, and other personal matters. Pt appeared to be lonely, down, anxious, which is an indicated she does not have a good support system. Pt feels helpless but states she is dependent on God. Pt requested prayers, which [facility] provided with emphatic listening and presence. Note: Chaplain recommends a pastoral care follow up with this pt as needed.

## 2017-03-26 NOTE — Anesthesia Procedure Notes (Signed)
Anesthesia Regional Block: Interscalene brachial plexus block   Pre-Anesthetic Checklist: ,, timeout performed, Correct Patient, Correct Site, Correct Laterality, Correct Procedure, Correct Position, site marked, Risks and benefits discussed,  Surgical consent,  Pre-op evaluation,  At surgeon's request and post-op pain management  Laterality: Upper and Right  Prep: chloraprep       Needles:  Injection technique: Single-shot  Needle Type: Stimiplex     Needle Length: 5cm  Needle Gauge: 22     Additional Needles:   Procedures: ultrasound guided,,,,,,,,  Narrative:  Start time: 03/26/2017 7:18 AM End time: 03/26/2017 7:21 AM Injection made incrementally with aspirations every 5 mL.  Performed by: Personally  Anesthesiologist: Katy Fitch K  Additional Notes: Patient endorses baseline weakness and numbness in right shoulder, arm and hand.  Functioning IV was confirmed and monitors were applied.  A 51mm 22ga Stimuplex needle was used. Sterile prep,hand hygiene and sterile gloves were used.  Minimal sedation used for procedure.  No paresthesia endorsed by patient during the procedure.  Negative aspiration and negative test dose prior to incremental administration of local anesthetic. The patient tolerated the procedure well with no immediate complications.

## 2017-03-26 NOTE — Evaluation (Signed)
Physical Therapy Evaluation Patient Details Name: Gloria Allen MRN: [id number] DOB: 08-20-1946 Today's Date: 03/26/2017   History of Present Illness  71 y/o female s/p R total shoulder replacement 03/26/17.  Clinical Impression  Pt still feeling a little "groggy" from surgery but eager to try and work with PT and do a little activity. Educated pt that she will be limited with what she can do with the R shoulder and that she will very likely need assist with dressing, bathing, etc for a while and pt is not sure how much her grand-daughter will be able to help her.  She also needed assist to get to sitting even with bed rail use, however once up she did well with mobility and was able to do all she needed w/o issue.  She walked ~150 ft w/o AD (slow, steady gait) and negotiated up/down 4 steps w/o direct assist and minimal cuing only.     Follow Up Recommendations DC plan and follow up therapy as arranged by surgeon    Equipment Recommendations       Recommendations for Other Services       Precautions / Restrictions Precautions Precautions: Shoulder Type of Shoulder Precautions: total shoulder Shoulder Interventions: Shoulder sling/immobilizer;At all times Precaution Booklet Issued: No Restrictions Weight Bearing Restrictions: Yes RUE Weight Bearing: Non weight bearing      Mobility  Bed Mobility Overal bed mobility: Needs Assistance Bed Mobility: Supine to Sit     Supine to sit: Min assist     General bed mobility comments: Pt able to scoot hips to EOB and get LEs around, but even with L hand using rail struggles to get to sitting and did need direct assist to get upright  Transfers Overall transfer level: Modified independent Equipment used: None             General transfer comment: Pt able to rise to standing w/o issue, no hesitation/unsteadiness  Ambulation/Gait Ambulation/Gait assistance: Modified independent (Device/Increase time) Ambulation Distance (Feet):  150 Feet Assistive device: None       General Gait Details: Pt with slow but consistent cadence and no LOBs. Pt showed good confidence, she did have some minimal fatigue with the effort.   Stairs Stairs: Yes Stairs assistance: Modified independent (Device/Increase time) Stair Management: One rail Left Number of Stairs: 3 General stair comments: Pt with step-to strategy, needed only minimal cuing.  Good overall safety.  Wheelchair Mobility    Modified Rankin (Stroke Patients Only)       Balance                                             Pertinent Vitals/Pain Pain Assessment: 0-10 Pain Score: 6  Pain Location: R shoulder    Home Living Family/patient expects to be discharged to:: Private residence Living Arrangements: Other relatives (grand daughter) Available Help at Discharge: Family;Friend(s);Available PRN/intermittently Type of Home: House Home Access: Stairs to enter Entrance Stairs-Rails: Left Entrance Stairs-Number of Steps: 3   Home Equipment: None      Prior Function Level of Independence: Independent         Comments: Pt drives, runs errands, etc      Hand Dominance        Extremity/Trunk Assessment   Upper Extremity Assessment Upper Extremity Assessment:  (R shoulder NT, hand still numb, very minimal hand/wrist mvt)    Lower Extremity  Assessment Lower Extremity Assessment: Overall WFL for tasks assessed       Communication   Communication: No difficulties  Cognition Arousal/Alertness: Awake/alert Behavior During Therapy: WFL for tasks assessed/performed Overall Cognitive Status: Within Functional Limits for tasks assessed                                        General Comments      Exercises     Assessment/Plan    PT Assessment Patient needs continued PT services  PT Problem List Decreased strength;Decreased range of motion;Decreased activity tolerance;Decreased balance;Decreased  mobility;Decreased safety awareness;Decreased knowledge of precautions;Pain       PT Treatment Interventions Gait training;Stair training;Functional mobility training;Therapeutic activities;Balance training;Therapeutic exercise;Neuromuscular re-education;Patient/family education    PT Goals (Current goals can be found in the Care Plan section)  Acute Rehab PT Goals Patient Stated Goal: go home PT Goal Formulation: With patient Time For Goal Achievement: 04/09/17 Potential to Achieve Goals: Good    Frequency BID   Barriers to discharge        Co-evaluation               AM-PAC PT "6 Clicks" Daily Activity  Outcome Measure Difficulty turning over in bed (including adjusting bedclothes, sheets and blankets)?: A Lot Difficulty moving from lying on back to sitting on the side of the bed? : Total Difficulty sitting down on and standing up from a chair with arms (e.g., wheelchair, bedside commode, etc,.)?: None Help needed moving to and from a bed to chair (including a wheelchair)?: None Help needed walking in hospital room?: None Help needed climbing 3-5 steps with a railing? : None 6 Click Score: 19    End of Session Equipment Utilized During Treatment: Gait belt Activity Tolerance: Patient tolerated treatment well Patient left: with chair alarm set;with call bell/phone within reach Nurse Communication: Mobility status PT Visit Diagnosis: Muscle weakness (generalized) (M62.81);Difficulty in walking, not elsewhere classified (R26.2)    Time: 1914-7829 PT Time Calculation (min) (ACUTE ONLY): 31 min   Charges:   PT Evaluation $PT Eval Low Complexity: 1 Procedure PT Treatments $Gait Training: 8-22 mins   PT G Codes:        Kreg Shropshire, DPT 03/26/2017, 5:06 PM

## 2017-03-26 NOTE — Anesthesia Post-op Follow-up Note (Cosign Needed)
Anesthesia QCDR form completed.        

## 2017-03-26 NOTE — Care Management Obs Status (Signed)
[facility] NOTIFICATION   Patient Details  Name: VANNIE HILGERT MRN: [id number] Date of Birth: 06-26-1946   Medicare Observation Status Notification Given:  Yes    Jolly Mango, RN 03/26/2017, 3:16 PM

## 2017-03-26 NOTE — Progress Notes (Signed)
Another dose of phenergan given for nausea   No vomiting

## 2017-03-26 NOTE — Progress Notes (Signed)
  Subjective:  POST-OP CHECK:  Status post mini open rotator cuff repair for the right shoulder. Patient reports right shoulder pain as mild to moderate.  Patient is up out of bed to a chair.  Objective:   VITALS:   Vitals:   03/26/17 1312 03/26/17 1520 03/26/17 1610 03/26/17 1731  BP: (!) 133/56 (!) 136/53 (!) 140/56 (!) 137/58  Pulse: 67 69 71 70  Resp: 16 16 18 16   Temp:  97.9 F (36.6 C) 98.3 F (36.8 C) 98.1 F (36.7 C)  TempSrc:   Oral Oral  SpO2: 100% 98% 100% 100%  Weight:      Height:        PHYSICAL EXAM: Right upper extremity: Patient still has diminished sensation and paralysis in the right hand from her interscalene block.  Patient's right arm and is in an abduction sling. Her Polar Care is over the right shoulder. Her dressing is clean dry and intact.   LABS  No results found for this or any previous visit (from the past 24 hour(s)).  No results found.  Assessment/Plan: Day of Surgery   Active Problems:   S/P right rotator cuff repair  Patient remains in the hospital for postop pain control, neurovascular monitoring and IV antibiotics.  Patient evaluated by physical therapy this afternoon. She was able to get up out of bed to a chair. Patient will likely be discharged home tomorrow.  She lives alone and has concerns about being discharged to home by herself. She may require services upon discharge. A consult to care management has been placed.    Thornton Park , MD 03/26/2017, 6:47 PM

## 2017-03-26 NOTE — Anesthesia Postprocedure Evaluation (Signed)
Anesthesia Post Note  Patient: Gloria Allen  Procedure(s) Performed: Procedure(s) (LRB): right shoulder arthroscopy, arthroscopic subacromial decompression, distal clavicle excision, mini open rotator cuff repair (Right)  Patient location during evaluation: PACU Anesthesia Type: General Level of consciousness: awake and alert and oriented Pain management: pain level controlled Vital Signs Assessment: post-procedure vital signs reviewed and stable Respiratory status: spontaneous breathing, nonlabored ventilation and respiratory function stable Cardiovascular status: blood pressure returned to baseline and stable Postop Assessment: no signs of nausea or vomiting Anesthetic complications: no     Last Vitals:  Vitals:   03/26/17 1205 03/26/17 1211  BP:  136/60  Pulse: 71 72  Resp: 16 (!) 21  Temp: 36.4 C     Last Pain:  Vitals:   03/26/17 1224  TempSrc:   PainSc: 8                  Makaiyah Schweiger

## 2017-03-26 NOTE — Anesthesia Procedure Notes (Signed)
Procedure Name: Intubation Date/Time: 03/26/2017 7:46 AM Performed by: Hedda Slade Pre-anesthesia Checklist: Patient identified, Patient being monitored, Timeout performed, Emergency Drugs available and Suction available Patient Re-evaluated:Patient Re-evaluated prior to inductionOxygen Delivery Method: Circle system utilized Preoxygenation: Pre-oxygenation with 100% oxygen Intubation Type: IV induction Ventilation: Mask ventilation without difficulty Laryngoscope Size: Miller and 2 Grade View: Grade II Tube type: Oral Tube size: 7.0 mm Number of attempts: 2 Airway Equipment and Method: Stylet Placement Confirmation: ETT inserted through vocal cords under direct vision,  positive ETCO2 and breath sounds checked- equal and bilateral Secured at: 21 cm Tube secured with: Tape Dental Injury: Teeth and Oropharynx as per pre-operative assessment

## 2017-03-26 NOTE — Transfer of Care (Signed)
Immediate Anesthesia Transfer of Care Note  Patient: Gloria Allen  Procedure(s) Performed: Procedure(s): right shoulder arthroscopy, arthroscopic subacromial decompression, distal clavicle excision, mini open rotator cuff repair (Right)  Patient Location: PACU  Anesthesia Type:General  Level of Consciousness: sedated  Airway & Oxygen Therapy: Patient Spontanous Breathing and Patient connected to face mask oxygen  Post-op Assessment: Report given to RN and Post -op Vital signs reviewed and stable  Post vital signs: Reviewed and stable  Last Vitals:  Vitals:   03/26/17 0726 03/26/17 1101  BP: 124/85 (!) 178/72  Pulse: (!) 55 84  Resp: 14 12  Temp:  36.5 C    Last Pain:  Vitals:   03/26/17 1101  TempSrc: Tympanic  PainSc:          Complications: No apparent anesthesia complications

## 2017-03-26 NOTE — Anesthesia Preprocedure Evaluation (Signed)
Anesthesia Evaluation  Patient identified by MRN, date of birth, ID band Patient awake    Reviewed: Allergy & Precautions, H&P , NPO status , Patient's Chart, lab work & pertinent test results  History of Anesthesia Complications Negative for: history of anesthetic complications  Airway Mallampati: III  TM Distance: >3 FB Neck ROM: limited    Dental  (+) Poor Dentition, Chipped, Missing, Upper Dentures   Pulmonary asthma , former smoker,           Cardiovascular Exercise Tolerance: Good hypertension, (-) angina(-) Past MI      Neuro/Psych  Headaches, PSYCHIATRIC DISORDERS Anxiety Depression    GI/Hepatic Neg liver ROS, GERD  Controlled,  Endo/Other  negative endocrine ROS  Renal/GU      Musculoskeletal  (+) Arthritis ,   Abdominal   Peds  Hematology negative hematology ROS (+)   Anesthesia Other Findings Past Medical History: No date: Anxiety No date: Arthritis No date: Depression No date: Dyspnea     Comment: with exertion No date: GERD (gastroesophageal reflux disease) No date: Headache No date: Hx: UTI (urinary tract infection) No date: Hypertension  Past Surgical History: No date: ABDOMINAL HYSTERECTOMY No date: APPENDECTOMY No date: BREAST REDUCTION SURGERY Bilateral No date: DILATION AND CURETTAGE OF UTERUS  BMI    Body Mass Index:  26.63 kg/m      Reproductive/Obstetrics negative OB ROS                             Anesthesia Physical Anesthesia Plan  ASA: III  Anesthesia Plan: General ETT   Post-op Pain Management:  Regional for Post-op pain   Induction: Intravenous  PONV Risk Score and Plan: 3 and Ondansetron, Dexamethasone, Propofol and Midazolam  Airway Management Planned: Oral ETT  Additional Equipment:   Intra-op Plan:   Post-operative Plan: Extubation in OR  Informed Consent: I have reviewed the patients History and Physical, chart, labs and  discussed the procedure including the risks, benefits and alternatives for the proposed anesthesia with the patient or authorized representative who has indicated his/her understanding and acceptance.   Dental Advisory Given  Plan Discussed with: Anesthesiologist, CRNA and Surgeon  Anesthesia Plan Comments: (Patient consented for risks of anesthesia including but not limited to:  - adverse reactions to medications - damage to teeth, lips or other oral mucosa - sore throat or hoarseness - Damage to heart, brain, lungs or loss of life  Patient voiced understanding.)        Anesthesia Quick Evaluation

## 2017-03-26 NOTE — H&P (Signed)
The patient has been re-examined, and the chart reviewed, and there have been no interval changes to the documented history and physical.    The risks, benefits, and alternatives have been discussed at length, and the patient is willing to proceed.   

## 2017-03-26 NOTE — Progress Notes (Signed)
ADMISSION NOTE:  Pt admitted to room 146 from PACU. Alert and oriented X4. Immobilizer in place. Skin assessment completed with Threasa Beards RN. Pt oriented to room and call bell. Bed in lowest position call bell in reach and bed alarm on.

## 2017-03-27 ENCOUNTER — Encounter: Payer: Self-pay | Admitting: Orthopedic Surgery

## 2017-03-27 DIAGNOSIS — M75121 Complete rotator cuff tear or rupture of right shoulder, not specified as traumatic: Secondary | ICD-10-CM | POA: Diagnosis not present

## 2017-03-27 LAB — CBC
HEMATOCRIT: 35.2 % (ref 35.0–47.0)
Hemoglobin: 11.6 g/dL — ABNORMAL LOW (ref 12.0–16.0)
MCH: 27.6 pg (ref 26.0–34.0)
MCHC: 32.9 g/dL (ref 32.0–36.0)
MCV: 83.7 fL (ref 80.0–100.0)
Platelets: 310 10*3/uL (ref 150–440)
RBC: 4.21 MIL/uL (ref 3.80–5.20)
RDW: 14.2 % (ref 11.5–14.5)
WBC: 17.1 10*3/uL — ABNORMAL HIGH (ref 3.6–11.0)

## 2017-03-27 LAB — BASIC METABOLIC PANEL
Anion gap: 9 (ref 5–15)
BUN: 13 mg/dL (ref 6–20)
CALCIUM: 9.2 mg/dL (ref 8.9–10.3)
CHLORIDE: 107 mmol/L (ref 101–111)
CO2: 23 mmol/L (ref 22–32)
CREATININE: 0.74 mg/dL (ref 0.44–1.00)
GFR calc Af Amer: >60 mL/min (ref >60)
GFR calc non Af Amer: >60 mL/min (ref >60)
Glucose, Bld: 141 mg/dL — ABNORMAL HIGH (ref 65–99)
Potassium: 4.1 mmol/L (ref 3.5–5.1)
SODIUM: 139 mmol/L (ref 135–145)

## 2017-03-27 MED ORDER — OXYCODONE HCL 5 MG PO TABS: 5.0000 mg | ORAL_TABLET | ORAL | 0 refills | Status: DC | PRN

## 2017-03-27 NOTE — Care Management Note (Addendum)
Case Management Note  Patient Details  Name: Gloria Allen MRN: [id number] Date of Birth: [birth year]  Subjective/Objective:  Discharging today. Met with patient at bedside to discuss home health PT. She lives alone. She has a son that assists her with transpiration. She is concerned that she does not have a recliner to sleep in. Explained to her that insurance will not cover a recliner or lift chair. Offered suggestions like the hospice store or good will. Offered to set her up with home health to assist in options for sleeping sitting up and out of bed as MD has recommended. She has no agency preference. Referral to [facility] for [facility]. She declined a [facility]. No DME needed. PCP is  Dr. Brynda Greathouse.                   Action/Plan: [facility] for HHPT.   Expected Discharge Date:                  Expected Discharge Plan:  [facility]  In-House Referral:     Discharge planning Services  CM Consult  Post Acute Care Choice:  Home Health Choice offered to:  Patient  DME Arranged:    DME Agency:     HH Arranged:  PT, OT, SN [place] Agency:  [facility]  Status of Service:  Completed, signed off  If discussed at [facility] of Stay Meetings, dates discussed:    Additional Comments:  Jolly Mango, RN 03/27/2017, 9:43 AM

## 2017-03-27 NOTE — Discharge Summary (Signed)
Physician Discharge Summary  Patient ID: Gloria Allen MRN: [id number] DOB/AGE: 1946-02-24 71 y.o.  Admit date: 03/26/2017 Discharge date: 03/27/2017  Admission Diagnoses:  M75.121 Complete rotatr-cuff tear/ruptr of r shoulder, not trauma <principal problem not specified>  Discharge Diagnoses:  M75.121 Complete rotatr-cuff tear/ruptr of r shoulder, not trauma Active Problems:   S/P right rotator cuff repair   Past Medical History:  Diagnosis Date  . Anxiety   . Arthritis   . Depression   . Dyspnea    with exertion  . GERD (gastroesophageal reflux disease)   . Headache   . Hx: UTI (urinary tract infection)   . Hypertension     Surgeries: Procedure(s): right shoulder arthroscopy, arthroscopic subacromial decompression, distal clavicle excision, mini open rotator cuff repair on 03/26/2017   Consultants (if any):   Discharged Condition: Improved  Hospital Course: Gloria Allen is an 71 y.o. female who was admitted 03/26/2017 with a diagnosis of  M75.121 Complete rotatr-cuff tear/ruptr of r shoulder, not trauma <principal problem not specified> and went to the operating room on 03/26/2017 and underwent An uncomplicated repair to the right rotator cuff.  She was admitted postoperatively for pain control, IV antibiotics and neurovascular monitoring.    She was given perioperative antibiotics:  Anti-infectives    Start     Dose/Rate Route Frequency Ordered Stop   03/26/17 1400  ceFAZolin (ANCEF) IVPB 1 g/50 mL premix     1 g 100 mL/hr over 30 Minutes Intravenous Every 6 hours 03/26/17 1223 03/27/17 0134   03/26/17 0600  ceFAZolin (ANCEF) IVPB 2g/100 mL premix     2 g 200 mL/hr over 30 Minutes Intravenous On call to O.R. 03/25/17 2138 03/26/17 0805    .  She was given sequential compression devices, early ambulation forDVT prophylaxis.  She benefited maximally from the hospital stay and there were no complications.    Recent vital signs:  Vitals:   03/27/17 0425 03/27/17  0729  BP: (!) 156/58 (!) 164/58  Pulse:  70  Resp:  16  Temp:  97.7 F (36.5 C)    Recent laboratory studies:  Lab Results  Component Value Date   HGB 11.6 (L) 03/27/2017   HGB 13.4 03/21/2017   HGB 12.4 12/31/2016   Lab Results  Component Value Date   WBC 17.1 (H) 03/27/2017   PLT 310 03/27/2017   Lab Results  Component Value Date   INR 1.06 03/21/2017   Lab Results  Component Value Date   NA 139 03/27/2017   K 4.1 03/27/2017   CL 107 03/27/2017   CO2 23 03/27/2017   BUN 13 03/27/2017   CREATININE 0.74 03/27/2017   GLUCOSE 141 (H) 03/27/2017    Discharge Medications:   Allergies as of 03/27/2017   No Known Allergies     Medication List    STOP taking these medications   traMADol 50 MG tablet Commonly known as:  ULTRAM     TAKE these medications   albuterol 108 (90 Base) MCG/ACT inhaler Commonly known as:  PROVENTIL HFA;VENTOLIN HFA Inhale 2 puffs into the lungs every 6 (six) hours as needed for wheezing or shortness of breath.   amLODipine 10 MG tablet Commonly known as:  NORVASC Take 10 mg by mouth daily.   clonazePAM 1 MG tablet Commonly known as:  KLONOPIN Take 1 mg by mouth 2 (two) times daily.   cloNIDine 0.2 MG tablet Commonly known as:  CATAPRES Take 0.2 mg by mouth 2 (two) times daily.  montelukast 10 MG tablet Commonly known as:  SINGULAIR Take 10 mg by mouth at bedtime as needed.   omeprazole 40 MG capsule Commonly known as:  PRILOSEC Take 40 mg by mouth daily.   oxybutynin 5 MG tablet Commonly known as:  DITROPAN Take 5 mg by mouth 3 (three) times daily.   oxyCODONE 5 MG immediate release tablet Commonly known as:  Oxy IR/ROXICODONE Take 1-2 tablets (5-10 mg total) by mouth every 4 (four) hours as needed for breakthrough pain.   sertraline 50 MG tablet Commonly known as:  ZOLOFT Take 50 mg by mouth at bedtime.   terazosin 5 MG capsule Commonly known as:  HYTRIN Take 5 mg by mouth at bedtime.   traZODone 50 MG  tablet Commonly known as:  DESYREL Take 50 mg by mouth at bedtime as needed for sleep.   triamcinolone cream 0.1 % Commonly known as:  KENALOG Apply 1 application topically 2 (two) times daily as needed (itching).       Diagnostic Studies: No results found.  Disposition: 01-Home or Self Care  Discharge Instructions    Call MD / Call 911    Complete by:  As directed    If you experience chest pain or shortness of breath, CALL 911 and be transported to the hospital emergency room.  If you develope a fever above 101 F, pus (white drainage) or increased drainage or redness at the wound, or calf pain, call your surgeon's office.   Constipation Prevention    Complete by:  As directed    Drink plenty of fluids.  Prune juice may be helpful.  You may use a stool softener, such as Colace (over the counter) 100 mg twice a day.  Use MiraLax (over the counter) for constipation as needed.   Diet - low sodium heart healthy    Complete by:  As directed    Discharge instructions    Complete by:  As directed    Patient will be set up for home health PT. Patient will be given oxycodone for pain control. She is to continue wearing the sling at all times. She may discontinue her dressing in 3 days and may begin to shower.   Patient must not lift up her right arm away from her body for any reason.  Patient's follow up appointment with Dr. Phineas Real is July 3 @ 4:15 pm.   Driving restrictions    Complete by:  As directed    No driving for 4 weeks   Increase activity slowly as tolerated    Complete by:  As directed    Lifting restrictions    Complete by:  As directed    No lifting for 12-16 weeks         Signed: [place] ,MD 03/27/2017, 12:51 PM

## 2017-03-27 NOTE — Progress Notes (Signed)
   03/26/17 1600  PT Time Calculation  PT Start Time (ACUTE ONLY) 1613  PT Stop Time (ACUTE ONLY) 1644  PT Time Calculation (min) (ACUTE ONLY) 31 min  PT G-Codes **NOT FOR INPATIENT CLASS**  Functional Assessment Tool Used AM-PAC 6 Clicks Basic Mobility  Functional Limitation Mobility: Walking and moving around  Mobility: Walking and Moving Around Current Status (Y4314) CJ  Mobility: Walking and Moving Around Goal Status (C7670) CI  PT General Charges  $$ ACUTE PT VISIT 1 Procedure  PT Evaluation  $PT Eval Low Complexity 1 Procedure  PT Treatments  $Gait Training 8-22 mins   Late entry g-codes added after review of initial documentation/evaluation by Wayne Both, PT.  Reyes Ivan. Owens Shark, PT, DPT, NCS 03/27/17, 8:10 AM [phone]

## 2017-03-27 NOTE — Progress Notes (Signed)
Pt is alert and oriented. Medicated for pain through the night. Iv infusing without difficulty. Eating and drinking without difficulty. Pt was able to sleep some in between care. UP to bathroom with assistance and voiding without difficulty. Surgical dressing dry and intact. Tolerating polar care without difficulty.

## 2017-03-27 NOTE — Progress Notes (Signed)
Patient is alert and oriented and able to verbalize needs. No complaints of pain at this time. Vital signs stable. PIV removed. Discharge instructions gone over with patient at this time. Printed AVS and rx for Oxycodone given to patient. Patient verbalizes understanding of all discharge instructions and follow up care. No concerns voiced. Patient called family member for ride home. [place] set up per RNCM.   Bethann Punches, RN

## 2017-03-27 NOTE — Evaluation (Signed)
Occupational Therapy Evaluation Patient Details Name: Gloria Allen MRN: [id number] DOB: [birth year] Today's Date: 03/27/2017    History of Present Illness 71 y/o female s/p R total shoulder replacement 03/26/17.   Clinical Impression    Patient was evaluated this date by OT, she lives alone but currently has her 69 year old grand daughter living with her. Patient was independent prior to admission including driving and was retired. She was not using any AD for ambulating at home and does not have any AD at home.  Patient has orders for RUE immobilized and non WB, which is her dominant hand. Patient presents with decreased strength, decreased ability to perform self care tasks and will need further instructions for managing daily self care tasks with use of one hand only. Patient will benefit from skilled OT care to address these limitations and improve independence in daily tasks to return home with family. Patient would benefit from continued OT [facility] services.    Follow Up Recommendations  Home health OT    Equipment Recommendations  Other (comment) (rec reacher, elastic shoe laces and sock aid)    Recommendations for Other Services       Precautions / Restrictions Precautions Precautions: Shoulder Type of Shoulder Precautions: total shoulder Shoulder Interventions: Shoulder sling/immobilizer;At all times Precaution Booklet Issued: No Restrictions Weight Bearing Restrictions: Yes RUE Weight Bearing: Non weight bearing      Mobility Bed Mobility               General bed mobility comments: Received sitting upright at EOB and left sitting at EOB with staff  Transfers Overall transfer level: Modified independent Equipment used: None             General transfer comment: No issues with transfers identified. Good statiblity in standing. She does require slight increase in time to scoot forward to EOB    Balance                                            ADL either performed or assessed with clinical judgement   ADL Overall ADL's : Needs assistance/impaired Eating/Feeding: Set up;Minimal assistance   Grooming: Wash/dry hands;Wash/dry face;Oral care;Applying deodorant;Brushing hair;Set up;Minimal assistance;Standing Grooming Details (indicate cue type and reason): standing at sink Upper Body Bathing: Set up;Minimal assistance;Standing   Lower Body Bathing: Minimal assistance;Set up Lower Body Bathing Details (indicate cue type and reason): sponge bathing standing at sink with CGA assist Upper Body Dressing : Moderate assistance;Set up;Standing Upper Body Dressing Details (indicate cue type and reason): secondary to non WB in RUE which is dominant side and assist for sling and ice pack Lower Body Dressing: Set up;Moderate assistance;Sit to/from stand Lower Body Dressing Details (indicate cue type and reason): assist for donning socks and underwear over feet and able to pull up after this with supervision only             Functional mobility during ADLs: Supervision/safety General ADL Comments: pt able to complete sit to stand and standing at sink for sponge bathing and grooming iwth supervision and assist to hold RUE up with sling unbuckled but attached.     Vision Patient Visual Report: No change from baseline       Perception     Praxis      Pertinent Vitals/Pain Pain Assessment: 0-10 Pain Score: 8  Pain Location: R shoulder Pain Descriptors /  Indicators: Discomfort;Sore Pain Intervention(s): Limited activity within patient's tolerance;Monitored during session;Premedicated before session;Repositioned;Ice applied     Hand Dominance Right   Extremity/Trunk Assessment Upper Extremity Assessment Upper Extremity Assessment: RUE deficits/detail RUE Deficits / Details: In sling with ice pack in place which was not plugged in and was plugged in at end of session after completing ADLs--non WB RUE and full AROm in R  wrist and hand with mild tingling but no numbness RUE: Unable to fully assess due to immobilization RUE Coordination: decreased fine motor;decreased gross motor   Lower Extremity Assessment Lower Extremity Assessment: Defer to PT evaluation       Communication Communication Communication: No difficulties   Cognition Arousal/Alertness: Awake/alert Behavior During Therapy: WFL for tasks assessed/performed Overall Cognitive Status: Within Functional Limits for tasks assessed                                     General Comments       Exercises Exercises: General Upper Extremity;Other exercises General Exercises - Upper Extremity Elbow Flexion: PROM;Right;10 reps;Seated Elbow Extension: PROM;Right;10 reps;Seated Wrist Flexion: AROM;Right;10 reps;Seated Wrist Extension: AROM;Right;10 reps;Seated Other Exercises Other Exercises: Extensive education with patient about how to don/doff her R shoudler immobilizer as well as the polar care attachement. Adjusted the shoulder sling to improve support and pt reports significant increase in comfort after adjusting. Educated about using ball on sling for grip strengthening.    Shoulder Instructions      Home Living Family/patient expects to be discharged to:: Private residence Living Arrangements: Other relatives (grand daughter currently lives with her who is 58) Available Help at Discharge: Family;Friend(s);Available PRN/intermittently Type of Home: House Home Access: Stairs to enter [employer] of Steps: 3 Entrance Stairs-Rails: Left Home Layout: One level     Bathroom Shower/Tub: Tub/shower unit;Curtain   Bathroom Toilet: Handicapped height Bathroom Accessibility: Yes How Accessible: Accessible via walker Home Equipment: Shower seat;Toilet riser          Prior Functioning/Environment Level of Independence: Independent        Comments: Pt drives, runs errands, etc         OT Problem List:  Decreased strength;Decreased range of motion;Decreased activity tolerance;Pain;Impaired UE functional use      OT Treatment/Interventions: Self-care/ADL training;DME and/or AE instruction;Therapeutic activities;Patient/family education    OT Goals(Current goals can be found in the care plan section) Acute Rehab OT Goals Patient Stated Goal: go home OT Goal Formulation: With patient Time For Goal Achievement: 04/10/17 Potential to Achieve Goals: Good ADL Goals Pt Will Perform Upper Body Bathing: with min assist;sitting (following precautions for R shoulder) Pt Will Perform Upper Body Dressing: with min assist;sitting;with set-up (incl sling and ice pack following precautions for R shoulder) Pt Will Perform Lower Body Dressing: with min guard assist;sit to/from stand (using L non dominant hand only)  OT Frequency: Min 1X/week   Barriers to D/C:            Co-evaluation              AM-PAC PT "6 Clicks" Daily Activity     Outcome Measure Help from another person eating meals?: A Little Help from another person taking care of personal grooming?: A Little Help from another person toileting, which includes using toliet, bedpan, or urinal?: A Little Help from another person bathing (including washing, rinsing, drying)?: A Lot Help from another person to put on and taking off regular  upper body clothing?: A Lot Help from another person to put on and taking off regular lower body clothing?: A Little 6 Click Score: 16   End of Session    Activity Tolerance: Patient tolerated treatment well Patient left: in bed;with call bell/phone within reach;Other (comment) (pt refused bed alarm and was sitting EOB with bedside table in front of her and  [place] notified)  OT Visit Diagnosis: Muscle weakness (generalized) (M62.81);History of falling (Z91.81)                Time: 8325-4982 OT Time Calculation (min): 50 min Charges:  OT General Charges $OT Visit: 1 Procedure OT  Evaluation $OT Eval Low Complexity: 1 Procedure OT Treatments $Self Care/Home Management : 38-52 mins G-Codes: OT G-codes **NOT FOR INPATIENT CLASS** Functional Assessment Tool Used: AM-PAC 6 Clicks Daily Activity;Clinical judgement Functional Limitation: Self care Self Care Current Status (M4158): At least 40 percent but less than 60 percent impaired, limited or restricted Self Care Goal Status (X0940): At least 20 percent but less than 40 percent impaired, limited or restricted   Chrys Racer, OTR/L [place] 03/27/17, 1:27 PM

## 2017-03-27 NOTE — Progress Notes (Signed)
  Subjective:  POD #1 Patient reports right shoulder pain as mild to moderate.    Objective:   VITALS:   Vitals:   03/27/17 0100 03/27/17 0410 03/27/17 0425 03/27/17 0729  BP: (!) 160/74 (!) 194/66 (!) 156/58 (!) 164/58  Pulse:  (!) 54  70  Resp:  18  16  Temp:  97.7 F (36.5 C)  97.7 F (36.5 C)  TempSrc:  Oral  Oral  SpO2:  97%  100%  Weight:      Height:        PHYSICAL EXAM: Right upper extremity: Patient is wearing her abduction sling. She has a palpable radial pulse. Her dressing is clean dry and intact. She is a Polar Care overlying the right shoulder. She can flex and extend all 5 digits in the right hand. She has full wrist range of motion. She is intact sensation to light touch throughout all 5 digits and her finger is well-perfused. Patient can demonstrate deltoid contraction but had mild paresthesias over the lateral right arm.   LABS  Results for orders placed or performed during the hospital encounter of 03/26/17 (from the past 24 hour(s))  CBC     Status: Abnormal   Collection Time: 03/27/17  5:39 AM  Result Value Ref Range   WBC 17.1 (H) 3.6 - 11.0 K/uL   RBC 4.21 3.80 - 5.20 MIL/uL   Hemoglobin 11.6 (L) 12.0 - 16.0 g/dL   HCT 35.2 35.0 - 47.0 %   MCV 83.7 80.0 - 100.0 fL   MCH 27.6 26.0 - 34.0 pg   MCHC 32.9 32.0 - 36.0 g/dL   RDW 14.2 11.5 - 14.5 %   Platelets 310 150 - 440 K/uL  Basic metabolic panel     Status: Abnormal   Collection Time: 03/27/17  5:39 AM  Result Value Ref Range   Sodium 139 135 - 145 mmol/L   Potassium 4.1 3.5 - 5.1 mmol/L   Chloride 107 101 - 111 mmol/L   CO2 23 22 - 32 mmol/L   Glucose, Bld 141 (H) 65 - 99 mg/dL   BUN 13 6 - 20 mg/dL   Creatinine, Ser 0.74 0.44 - 1.00 mg/dL   Calcium 9.2 8.9 - 10.3 mg/dL   GFR calc non Af Amer >60 >60 mL/min   GFR calc Af Amer >60 >60 mL/min   Anion gap 9 5 - 15    No results found.  Assessment/Plan: 1 Day Post-Op   Active Problems:   S/P right rotator cuff repair  Patient is  making progress postop. She was evaluated by physical therapy will be set up for home health PT. Patient will be given oxycodone for pain control. She is to continue wearing the sling at all times. She may discontinue her dressing in 3 days and may begin to shower.   Patient must not lift up her right arm away from her body for any reason.  Patient's follow up appointment is   July 3 @ 4:15 pm.    [place] , MD 03/27/2017, 12:41 PM

## 2017-03-27 NOTE — Care Management (Signed)
Case discussed with Dr. Cindi Carbon. He wants patient to have home health PT. She will not need Lovenox. It is anticipated patient will discharge today

## 2017-03-27 NOTE — Progress Notes (Signed)
[place] made follow up with pt. Pt was sitting on the bed at the time of this visit. Pt states she will be discharged today. Pt talked about impersonal relationship, transcendent relationship, talked about about her church, family loss, and others issues that [place] observes might be stressors in pt's life. Pt appeared not to be pleased that she will be discharged this afternoon. Pt requested prayers for her family and all the concerns she mentioned to [facility]. [place] offered prayers and ministry of presence.

## 2017-03-27 NOTE — Progress Notes (Signed)
Physical Therapy Treatment Patient Details Name: Gloria Allen MRN: [id number] DOB: 1945/11/19 Today's Date: 03/27/2017    History of Present Illness 72 y/o female s/p R total shoulder replacement 03/26/17.    PT Comments    Pt continues to demonstrates safe transfers and ambulation. She is able to complete stair training again with good safety/stability using single rail and step-to pattern. Pt educated and performed PROM of R elbow, wrist, and hand. Educated pt how to don/doff sling and polar care attachment. She is able to ambulate a full lap around RN station but does demonstrate mild stagger with horizontal head turns. She is easily able to self correct. VSS throughout ambulation and pt denies DOE. Progressed to single point cane and pt reports added stability and security with cane. Encouraged use at home in LUE with ambulation for added safety and security especially with head turns when walking. Pt is safe to discharge home when medically stable. Home health vs outpatient PT per surgeon recommendation/protocol. Pt will benefit from skilled PT services to address deficits in strength, balance, and mobility in order to return to full function at home.     Follow Up Recommendations  DC plan and follow up therapy as arranged by surgeon     Equipment Recommendations  Other (comment) (Encouraged pt to get a single point cane at discharge)    Recommendations for Other Services       Precautions / Restrictions Precautions Precautions: Shoulder Type of Shoulder Precautions: total shoulder Shoulder Interventions: Shoulder sling/immobilizer;At all times Precaution Booklet Issued: No Restrictions Weight Bearing Restrictions: Yes RUE Weight Bearing: Non weight bearing    Mobility  Bed Mobility               General bed mobility comments: Received sitting upright at EOB and left sitting at EOB with staff  Transfers Overall transfer level: Modified independent Equipment used:  None             General transfer comment: No issues with transfers identified. Good statiblity in standing. She does require slight increase in time to scoot forward to EOB  Ambulation/Gait Ambulation/Gait assistance: Modified independent (Device/Increase time) Ambulation Distance (Feet): 250 Feet Assistive device: None (progressed to straight cane) Gait Pattern/deviations: WFL(Within Functional Limits) Gait velocity: WFL for limited community mobility   General Gait Details: Gait speed is decreased but consistence cadence. Mild stagger with horizontal head turns but easily able to self correct. VSS throughout ambulation and pt denies DOE. Progressed to single point cane and pt reports added stability and security with cane. Encouraged use at home in LUE with ambulation for added safety and security especially with head turns when walking   Stairs Stairs: Yes   Stair Management: One rail Left Number of Stairs: 4 General stair comments: Pt with step-to strategy, needed only minimal cuing.  Good safety. Educated about how to descend sideways in order to utilized R rail at home during descent.   Wheelchair Mobility    Modified Rankin (Stroke Patients Only)       Balance                                            Cognition Arousal/Alertness: Awake/alert Behavior During Therapy: WFL for tasks assessed/performed Overall Cognitive Status: Within Functional Limits for tasks assessed  Exercises General Exercises - Upper Extremity Elbow Flexion: PROM;Right;10 reps;Seated Elbow Extension: PROM;Right;10 reps;Seated Wrist Flexion: AROM;Right;10 reps;Seated Wrist Extension: AROM;Right;10 reps;Seated Other Exercises Other Exercises: Extensive education with patient about how to don/doff her R shoudler immobilizer as well as the polar care attachement. Adjusted the shoulder sling to improve support and pt  reports significant increase in comfort after adjusting. Educated about using ball on sling for grip strengthening.     General Comments        Pertinent Vitals/Pain Pain Assessment: 0-10 Pain Score: 9  Pain Location: R shoulder Pain Descriptors / Indicators: Sore Pain Intervention(s): Monitored during session;Premedicated before session    Home Living                      Prior Function            PT Goals (current goals can now be found in the care plan section) Acute Rehab PT Goals Patient Stated Goal: go home PT Goal Formulation: With patient Time For Goal Achievement: 04/09/17 Potential to Achieve Goals: Good Progress towards PT goals: Progressing toward goals    Frequency    BID      PT Plan Current plan remains appropriate    Co-evaluation              AM-PAC PT "6 Clicks" Daily Activity  Outcome Measure  Difficulty turning over in bed (including adjusting bedclothes, sheets and blankets)?: A Lot Difficulty moving from lying on back to sitting on the side of the bed? : Total Difficulty sitting down on and standing up from a chair with arms (e.g., wheelchair, bedside commode, etc,.)?: None Help needed moving to and from a bed to chair (including a wheelchair)?: None Help needed walking in hospital room?: None Help needed climbing 3-5 steps with a railing? : None 6 Click Score: 19    End of Session Equipment Utilized During Treatment: Gait belt Activity Tolerance: Patient tolerated treatment well Patient left: with call bell/phone within reach;with bed alarm set;in bed;Other (comment);with nursing/sitter in room (sitting upright at EOB with staff)   PT Visit Diagnosis: Muscle weakness (generalized) (M62.81);Difficulty in walking, not elsewhere classified (R26.2);Pain Pain - Right/Left: Right Pain - part of body: Shoulder     Time: 0831-0900 PT Time Calculation (min) (ACUTE ONLY): 29 min  Charges:  $Gait Training: 8-22  mins $Therapeutic Exercise: 8-22 mins                    G Codes:  Functional Assessment Tool Used: AM-PAC 6 Clicks Basic Mobility Functional Limitation: Mobility: Walking and moving around Mobility: Walking and Moving Around Current Status ([id number]): At least 20 percent but less than 40 percent impaired, limited or restricted Mobility: Walking and Moving Around Goal Status [phone]): At least 1 percent but less than 20 percent impaired, limited or restricted    Gloria Allen PT, DPT     Gloria Allen 03/27/2017, 10:52 AM

## 2017-05-01 DIAGNOSIS — M25611 Stiffness of right shoulder, not elsewhere classified: Secondary | ICD-10-CM | POA: Diagnosis not present

## 2017-05-01 DIAGNOSIS — M25511 Pain in right shoulder: Secondary | ICD-10-CM | POA: Diagnosis not present

## 2017-05-03 DIAGNOSIS — M25611 Stiffness of right shoulder, not elsewhere classified: Secondary | ICD-10-CM | POA: Diagnosis not present

## 2017-05-03 DIAGNOSIS — M25511 Pain in right shoulder: Secondary | ICD-10-CM | POA: Diagnosis not present

## 2017-05-08 DIAGNOSIS — M25611 Stiffness of right shoulder, not elsewhere classified: Secondary | ICD-10-CM | POA: Diagnosis not present

## 2017-05-08 DIAGNOSIS — M25511 Pain in right shoulder: Secondary | ICD-10-CM | POA: Diagnosis not present

## 2017-05-09 DIAGNOSIS — M5416 Radiculopathy, lumbar region: Secondary | ICD-10-CM | POA: Diagnosis not present

## 2017-05-09 DIAGNOSIS — M5412 Radiculopathy, cervical region: Secondary | ICD-10-CM | POA: Diagnosis not present

## 2017-05-09 DIAGNOSIS — M75121 Complete rotator cuff tear or rupture of right shoulder, not specified as traumatic: Secondary | ICD-10-CM | POA: Diagnosis not present

## 2017-05-10 DIAGNOSIS — M25611 Stiffness of right shoulder, not elsewhere classified: Secondary | ICD-10-CM | POA: Diagnosis not present

## 2017-05-10 DIAGNOSIS — M25511 Pain in right shoulder: Secondary | ICD-10-CM | POA: Diagnosis not present

## 2017-05-15 DIAGNOSIS — M25511 Pain in right shoulder: Secondary | ICD-10-CM | POA: Diagnosis not present

## 2017-05-15 DIAGNOSIS — M25611 Stiffness of right shoulder, not elsewhere classified: Secondary | ICD-10-CM | POA: Diagnosis not present

## 2017-05-17 DIAGNOSIS — M25611 Stiffness of right shoulder, not elsewhere classified: Secondary | ICD-10-CM | POA: Diagnosis not present

## 2017-05-17 DIAGNOSIS — M25511 Pain in right shoulder: Secondary | ICD-10-CM | POA: Diagnosis not present

## 2017-05-21 DIAGNOSIS — M25611 Stiffness of right shoulder, not elsewhere classified: Secondary | ICD-10-CM | POA: Diagnosis not present

## 2017-05-21 DIAGNOSIS — M25511 Pain in right shoulder: Secondary | ICD-10-CM | POA: Diagnosis not present

## 2017-05-22 DIAGNOSIS — M545 Low back pain: Secondary | ICD-10-CM | POA: Diagnosis not present

## 2017-05-22 DIAGNOSIS — S39012S Strain of muscle, fascia and tendon of lower back, sequela: Secondary | ICD-10-CM | POA: Diagnosis not present

## 2017-05-23 DIAGNOSIS — M25511 Pain in right shoulder: Secondary | ICD-10-CM | POA: Diagnosis not present

## 2017-05-23 DIAGNOSIS — M545 Low back pain: Secondary | ICD-10-CM | POA: Diagnosis not present

## 2017-05-23 DIAGNOSIS — M25611 Stiffness of right shoulder, not elsewhere classified: Secondary | ICD-10-CM | POA: Diagnosis not present

## 2017-05-30 DIAGNOSIS — M25511 Pain in right shoulder: Secondary | ICD-10-CM | POA: Diagnosis not present

## 2017-05-30 DIAGNOSIS — M25611 Stiffness of right shoulder, not elsewhere classified: Secondary | ICD-10-CM | POA: Diagnosis not present

## 2017-05-30 DIAGNOSIS — M545 Low back pain: Secondary | ICD-10-CM | POA: Diagnosis not present

## 2017-06-04 DIAGNOSIS — M25611 Stiffness of right shoulder, not elsewhere classified: Secondary | ICD-10-CM | POA: Diagnosis not present

## 2017-06-04 DIAGNOSIS — M545 Low back pain: Secondary | ICD-10-CM | POA: Diagnosis not present

## 2017-06-04 DIAGNOSIS — M25511 Pain in right shoulder: Secondary | ICD-10-CM | POA: Diagnosis not present

## 2017-06-06 ENCOUNTER — Encounter: Payer: Self-pay | Admitting: Family Medicine

## 2017-06-06 ENCOUNTER — Ambulatory Visit (INDEPENDENT_AMBULATORY_CARE_PROVIDER_SITE_OTHER): Payer: Medicare Other | Admitting: Family Medicine

## 2017-06-06 VITALS — BP 130/72 | HR 61 | Temp 98.8°F | Ht 65.3 in | Wt 160.0 lb

## 2017-06-06 DIAGNOSIS — K219 Gastro-esophageal reflux disease without esophagitis: Secondary | ICD-10-CM

## 2017-06-06 DIAGNOSIS — F419 Anxiety disorder, unspecified: Secondary | ICD-10-CM

## 2017-06-06 DIAGNOSIS — Z13220 Encounter for screening for lipoid disorders: Secondary | ICD-10-CM

## 2017-06-06 DIAGNOSIS — I10 Essential (primary) hypertension: Secondary | ICD-10-CM

## 2017-06-06 DIAGNOSIS — F339 Major depressive disorder, recurrent, unspecified: Secondary | ICD-10-CM | POA: Diagnosis not present

## 2017-06-06 DIAGNOSIS — I129 Hypertensive chronic kidney disease with stage 1 through stage 4 chronic kidney disease, or unspecified chronic kidney disease: Secondary | ICD-10-CM | POA: Insufficient documentation

## 2017-06-06 DIAGNOSIS — M545 Low back pain, unspecified: Secondary | ICD-10-CM

## 2017-06-06 DIAGNOSIS — G8929 Other chronic pain: Secondary | ICD-10-CM | POA: Insufficient documentation

## 2017-06-06 DIAGNOSIS — N390 Urinary tract infection, site not specified: Secondary | ICD-10-CM | POA: Diagnosis not present

## 2017-06-06 DIAGNOSIS — M5441 Lumbago with sciatica, right side: Secondary | ICD-10-CM

## 2017-06-06 DIAGNOSIS — Z114 Encounter for screening for human immunodeficiency virus [HIV]: Secondary | ICD-10-CM

## 2017-06-06 DIAGNOSIS — M5442 Lumbago with sciatica, left side: Secondary | ICD-10-CM

## 2017-06-06 DIAGNOSIS — Z1159 Encounter for screening for other viral diseases: Secondary | ICD-10-CM | POA: Diagnosis not present

## 2017-06-06 DIAGNOSIS — F32A Depression, unspecified: Secondary | ICD-10-CM | POA: Insufficient documentation

## 2017-06-06 DIAGNOSIS — F329 Major depressive disorder, single episode, unspecified: Secondary | ICD-10-CM | POA: Insufficient documentation

## 2017-06-06 DIAGNOSIS — M25611 Stiffness of right shoulder, not elsewhere classified: Secondary | ICD-10-CM | POA: Diagnosis not present

## 2017-06-06 DIAGNOSIS — M25511 Pain in right shoulder: Secondary | ICD-10-CM | POA: Diagnosis not present

## 2017-06-06 MED ORDER — SERTRALINE HCL 100 MG PO TABS: 100.0000 mg | ORAL_TABLET | Freq: Every day | ORAL | 3 refills | Status: DC

## 2017-06-06 NOTE — Assessment & Plan Note (Signed)
Not under good control. Will increase her zoloft to 100mg  and recheck in 1 month.

## 2017-06-06 NOTE — Assessment & Plan Note (Signed)
Chronic. Will obtain records from previous providers. Referral to pain center made today. Call with any concerns.

## 2017-06-06 NOTE — Assessment & Plan Note (Signed)
Under good control today. Continue current regimen. Continue to monitor. Call with any concerns.  

## 2017-06-06 NOTE — Progress Notes (Signed)
BP 130/72 (BP Location: Left Arm, Patient Position: Sitting, Cuff Size: Normal)   Pulse 61   Temp 98.8 F (37.1 C)   Ht 5' 5.3" (1.659 m)   Wt 160 lb (72.6 kg)   SpO2 98%   BMI 26.38 kg/m    Subjective:    Patient ID: Gloria Allen, female    DOB: 10-Feb-1946, 71 y.o.   MRN: [id number]  HPI: Gloria Allen is a 70 y.o. female, has been following with a disability doctor rather than a regular primary care doctor. Last saw him about 2-3 months ago.  Chief Complaint  Patient presents with  . Establish Care   Has been following for low back pain that has been happening for several years.   HYPERTENSION Hypertension status: stable  Satisfied with current treatment? yes Duration of hypertension: chronic BP monitoring frequency:  not checking BP medication side effects:  no Medication compliance: excellent compliance Previous BP meds: clonidine, terazosin  Aspirin: no Recurrent headaches: no Visual changes: no Palpitations: no Dyspnea: no Chest pain: no Lower extremity edema: no Dizzy/lightheaded: no  DEPRESSION/ANXIETY- has been getting worse. Her son went to prison. Has been on sertraline for about 9 months to a year, she notes that it hasn't been helping. Has been taking it every day.  Mood status: uncontrolled Satisfied with current treatment?: no Symptom severity: severe  Duration of current treatment : months Side effects: no Medication compliance: good compliance Psychotherapy/counseling: yes in the past- [facility], didn't really help Previous psychiatric medications: sertraline, klonopin, remeron, hydroxyzine Depressed mood: yes Anxious mood: yes Anhedonia: yes Significant weight loss or gain: no Insomnia: yes hard to fall asleep Fatigue: yes Feelings of worthlessness or guilt: yes Impaired concentration/indecisiveness: yes Suicidal ideations: no Hopelessness: yes Crying spells: yes Depression screen PHQ 2/9 06/06/2017  Decreased Interest 3  Down,  Depressed, Hopeless 3  PHQ - 2 Score 6  Altered sleeping 3  Tired, decreased energy 3  Change in appetite 3  Feeling bad or failure about yourself  3  Trouble concentrating 3  Moving slowly or fidgety/restless 3  Suicidal thoughts 0  PHQ-9 Score 24     Active Ambulatory Problems    Diagnosis Date Noted  . S/P right rotator cuff repair 03/26/2017  . Anxiety   . Depression   . GERD (gastroesophageal reflux disease)   . Hypertension   . Chronic bilateral low back pain without sciatica 06/06/2017   Resolved Ambulatory Problems    Diagnosis Date Noted  . No Resolved Ambulatory Problems   Past Medical History:  Diagnosis Date  . Anxiety   . Arthritis   . Depression   . Dyspnea   . GERD (gastroesophageal reflux disease)   . Headache   . Hx: UTI (urinary tract infection)   . Hypertension    Past Surgical History:  Procedure Laterality Date  . ABDOMINAL HYSTERECTOMY    . APPENDECTOMY    . BREAST REDUCTION SURGERY Bilateral   . DILATION AND CURETTAGE OF UTERUS    . SHOULDER ARTHROSCOPY WITH OPEN ROTATOR CUFF REPAIR AND DISTAL CLAVICLE ACROMINECTOMY Right 03/26/2017   Procedure: right shoulder arthroscopy, arthroscopic subacromial decompression, distal clavicle excision, mini open rotator cuff repair;  Surgeon: Thornton Park, MD;  Location: [facility] ORS;  Service: Orthopedics;  Laterality: Right;   Outpatient Encounter Prescriptions as of 06/06/2017  Medication Sig  . HYDROcodone-acetaminophen (NORCO/VICODIN) 5-325 MG tablet Take 1 tablet by mouth every 6 (six) hours as needed for moderate pain.  Marland Kitchen albuterol (PROVENTIL  HFA;VENTOLIN HFA) 108 (90 Base) MCG/ACT inhaler Inhale 2 puffs into the lungs every 6 (six) hours as needed for wheezing or shortness of breath.  . cloNIDine (CATAPRES) 0.3 MG tablet   . hydrOXYzine (ATARAX/VISTARIL) 10 MG tablet take 4 tablet by mouth four times a day  . Magnesium Oxide 400 (240 Mg) MG TABS Take 1 tablet by mouth 2 (two) times daily.  .  mirtazapine (REMERON) 15 MG tablet   . montelukast (SINGULAIR) 10 MG tablet Take 10 mg by mouth at bedtime as needed.   Marland Kitchen omeprazole (PRILOSEC) 40 MG capsule Take 40 mg by mouth daily.  Marland Kitchen oxybutynin (DITROPAN) 5 MG tablet Take 5 mg by mouth 3 (three) times daily.  Marland Kitchen oxyCODONE-acetaminophen (PERCOCET/ROXICET) 5-325 MG tablet Take 1 tablet by mouth every 4 (four) hours as needed for severe pain.  . potassium chloride SA (K-DUR,KLOR-CON) 20 MEQ tablet potassium chloride ER 20 mEq tablet,extended release(part/cryst)  . sertraline (ZOLOFT) 100 MG tablet Take 1 tablet (100 mg total) by mouth at bedtime.  Marland Kitchen terazosin (HYTRIN) 5 MG capsule Take 5 mg by mouth at bedtime.  . traMADol (ULTRAM) 50 MG tablet tramadol 50 mg tablet  take 1 tablet by mouth every 6 hours if needed for pain  . triamcinolone cream (KENALOG) 0.1 % Apply 1 application topically 2 (two) times daily as needed (itching).  . [DISCONTINUED] amLODipine (NORVASC) 10 MG tablet Take 10 mg by mouth daily.  . [DISCONTINUED] clonazePAM (KLONOPIN) 1 MG tablet Take 1 mg by mouth 2 (two) times daily.  . [DISCONTINUED] cloNIDine (CATAPRES) 0.2 MG tablet Take 0.2 mg by mouth 2 (two) times daily.  . [DISCONTINUED] oxyCODONE (OXY IR/ROXICODONE) 5 MG immediate release tablet Take 1-2 tablets (5-10 mg total) by mouth every 4 (four) hours as needed for breakthrough pain.  . [DISCONTINUED] sertraline (ZOLOFT) 50 MG tablet Take 50 mg by mouth at bedtime.   . [DISCONTINUED] traZODone (DESYREL) 50 MG tablet Take 50 mg by mouth at bedtime as needed for sleep.   No facility-administered encounter medications on file as of 06/06/2017.    No Known Allergies  Family History  Problem Relation Age of Onset  . Hypertension Mother    Social History   Social History  . Marital status: Divorced    Spouse name: N/A  . Number of children: N/A  . Years of education: N/A   Occupational History  . Not on file.   Social History Main Topics  . Smoking status:  Former Smoker    Packs/day: 1.00    Types: Cigarettes  . Smokeless tobacco: Never Used  . Alcohol use No  . Drug use: No  . Sexual activity: No   Other Topics Concern  . Not on file   Social History Narrative  . No narrative on file   Review of Systems  Constitutional: Positive for fatigue. Negative for activity change, appetite change, chills, diaphoresis, fever and unexpected weight change.  Respiratory: Negative.   Cardiovascular: Negative.   Musculoskeletal: Positive for arthralgias, back pain and myalgias. Negative for gait problem, joint swelling, neck pain and neck stiffness.  Neurological: Negative.   Psychiatric/Behavioral: Negative.     Per HPI unless specifically indicated above     Objective:    BP 130/72 (BP Location: Left Arm, Patient Position: Sitting, Cuff Size: Normal)   Pulse 61   Temp 98.8 F (37.1 C)   Ht 5' 5.3" (1.659 m)   Wt 160 lb (72.6 kg)   SpO2 98%   BMI 26.38  kg/m   Wt Readings from Last 3 Encounters:  06/06/17 160 lb (72.6 kg)  03/26/17 160 lb (72.6 kg)  03/21/17 160 lb (72.6 kg)    Physical Exam  Constitutional: She is oriented to person, place, and time. She appears well-developed and well-nourished. No distress.  HENT:  Head: Normocephalic and atraumatic.  Right Ear: Hearing normal.  Left Ear: Hearing normal.  Nose: Nose normal.  Eyes: Conjunctivae and lids are normal. Right eye exhibits no discharge. Left eye exhibits no discharge. No scleral icterus.  Cardiovascular: Normal rate, regular rhythm, normal heart sounds and intact distal pulses.  Exam reveals no gallop and no friction rub.   No murmur heard. Pulmonary/Chest: Effort normal and breath sounds normal. No respiratory distress. She has no wheezes. She has no rales. She exhibits no tenderness.  Musculoskeletal: Normal range of motion.  Neurological: She is alert and oriented to person, place, and time.  Skin: Skin is warm, dry and intact. No rash noted. She is not  diaphoretic. No erythema. No pallor.  Psychiatric: She has a normal mood and affect. Her speech is normal and behavior is normal. Judgment and thought content normal. Cognition and memory are normal.  Nursing note and vitals reviewed.   Results for orders placed or performed during the hospital encounter of 03/26/17  CBC  Result Value Ref Range   WBC 17.1 (H) 3.6 - 11.0 K/uL   RBC 4.21 3.80 - 5.20 MIL/uL   Hemoglobin 11.6 (L) 12.0 - 16.0 g/dL   HCT 35.2 35.0 - 47.0 %   MCV 83.7 80.0 - 100.0 fL   MCH 27.6 26.0 - 34.0 pg   MCHC 32.9 32.0 - 36.0 g/dL   RDW 14.2 11.5 - 14.5 %   Platelets 310 150 - 440 K/uL  Basic metabolic panel  Result Value Ref Range   Sodium 139 135 - 145 mmol/L   Potassium 4.1 3.5 - 5.1 mmol/L   Chloride 107 101 - 111 mmol/L   CO2 23 22 - 32 mmol/L   Glucose, Bld 141 (H) 65 - 99 mg/dL   BUN 13 6 - 20 mg/dL   Creatinine, Ser 0.74 0.44 - 1.00 mg/dL   Calcium 9.2 8.9 - 10.3 mg/dL   GFR calc non Af Amer >60 >60 mL/min   GFR calc Af Amer >60 >60 mL/min   Anion gap 9 5 - 15      Assessment & Plan:   Problem List Items Addressed This Visit      Cardiovascular and Mediastinum   Hypertension    Under good control today. Continue current regimen. Continue to monitor. Call with any concerns.       Relevant Medications   cloNIDine (CATAPRES) 0.3 MG tablet   Other Relevant Orders   Comprehensive metabolic panel   Microalbumin, Urine Waived   TSH   UA/M w/rflx Culture, Routine     Digestive   GERD (gastroesophageal reflux disease)    Under good control today. Continue current regimen. Continue to monitor. Call with any concerns.       Relevant Orders   CBC with Differential/Platelet   Comprehensive metabolic panel   UA/M w/rflx Culture, Routine     Other   Anxiety    Not under good control. Will increase her zoloft to 100mg  and recheck in 1 month.       Relevant Medications   hydrOXYzine (ATARAX/VISTARIL) 10 MG tablet   mirtazapine (REMERON) 15 MG  tablet   sertraline (ZOLOFT) 100 MG tablet  Other Relevant Orders   Comprehensive metabolic panel   TSH   UA/M w/rflx Culture, Routine   Depression - Primary    Not under good control. Will increase her zoloft to 100mg  and recheck in 1 month.       Relevant Medications   hydrOXYzine (ATARAX/VISTARIL) 10 MG tablet   mirtazapine (REMERON) 15 MG tablet   sertraline (ZOLOFT) 100 MG tablet   Other Relevant Orders   Comprehensive metabolic panel   TSH   UA/M w/rflx Culture, Routine   Chronic bilateral low back pain without sciatica    Chronic. Will obtain records from previous providers. Referral to pain center made today. Call with any concerns.       Relevant Medications   traMADol (ULTRAM) 50 MG tablet   oxyCODONE-acetaminophen (PERCOCET/ROXICET) 5-325 MG tablet   HYDROcodone-acetaminophen (NORCO/VICODIN) 5-325 MG tablet   Other Relevant Orders   Ambulatory referral to Pain Clinic   Comprehensive metabolic panel   UA/M w/rflx Culture, Routine    Other Visit Diagnoses    Screening for cholesterol level       Labs drawn today. Await results.    Relevant Orders   Lipid Panel w/o Chol/HDL Ratio   Screening for HIV without presence of risk factors       Labs drawn today. Await results.    Relevant Orders   HIV antibody   Encounter for hepatitis C screening test for low risk patient       Labs drawn today. Await results.    Relevant Orders   Hepatitis C Antibody       Follow up plan: Return 2-3 weeks, for Records release please.

## 2017-06-07 ENCOUNTER — Encounter: Payer: Self-pay | Admitting: Family Medicine

## 2017-06-07 LAB — CBC WITH DIFFERENTIAL/PLATELET
BASOS: 1 %
Basophils Absolute: 0.1 10*3/uL (ref 0.0–0.2)
EOS (ABSOLUTE): 0.3 10*3/uL (ref 0.0–0.4)
Eos: 4 %
HEMATOCRIT: 36.5 % (ref 34.0–46.6)
Hemoglobin: 12.1 g/dL (ref 11.1–15.9)
IMMATURE GRANULOCYTES: 0 %
Immature Grans (Abs): 0 10*3/uL (ref 0.0–0.1)
LYMPHS ABS: 2.6 10*3/uL (ref 0.7–3.1)
Lymphs: 30 %
MCH: 27.6 pg (ref 26.6–33.0)
MCHC: 33.2 g/dL (ref 31.5–35.7)
MCV: 83 fL (ref 79–97)
MONOS ABS: 0.4 10*3/uL (ref 0.1–0.9)
Monocytes: 5 %
NEUTROS PCT: 60 %
Neutrophils Absolute: 5.3 10*3/uL (ref 1.4–7.0)
PLATELETS: 329 10*3/uL (ref 150–379)
RBC: 4.39 x10E6/uL (ref 3.77–5.28)
RDW: 14.1 % (ref 12.3–15.4)
WBC: 8.7 10*3/uL (ref 3.4–10.8)

## 2017-06-07 LAB — COMPREHENSIVE METABOLIC PANEL
A/G RATIO: 1.6 (ref 1.2–2.2)
ALK PHOS: 147 IU/L — AB (ref 39–117)
ALT: 8 IU/L (ref 0–32)
AST: 16 IU/L (ref 0–40)
Albumin: 4.2 g/dL (ref 3.6–4.8)
BUN/Creatinine Ratio: 15 (ref 12–28)
BUN: 12 mg/dL (ref 8–27)
CALCIUM: 9.6 mg/dL (ref 8.7–10.3)
CHLORIDE: 98 mmol/L (ref 96–106)
CO2: 23 mmol/L (ref 20–29)
Creatinine, Ser: 0.81 mg/dL (ref 0.57–1.00)
GFR calc Af Amer: 86 mL/min/{1.73_m2} (ref >59)
GFR, EST NON AFRICAN AMERICAN: 74 mL/min/{1.73_m2} (ref >59)
GLOBULIN, TOTAL: 2.7 g/dL (ref 1.5–4.5)
Glucose: 98 mg/dL (ref 65–99)
POTASSIUM: 3.8 mmol/L (ref 3.5–5.2)
SODIUM: 139 mmol/L (ref 134–144)
Total Protein: 6.9 g/dL (ref 6.0–8.5)

## 2017-06-07 LAB — LIPID PANEL W/O CHOL/HDL RATIO
CHOLESTEROL TOTAL: 169 mg/dL (ref 100–199)
HDL: 49 mg/dL (ref >39)
LDL Calculated: 84 mg/dL (ref 0–99)
TRIGLYCERIDES: 182 mg/dL — AB (ref 0–149)
VLDL Cholesterol Cal: 36 mg/dL (ref 5–40)

## 2017-06-07 LAB — TSH: TSH: 0.917 u[IU]/mL (ref 0.450–4.500)

## 2017-06-07 LAB — HEPATITIS C ANTIBODY: Hep C Virus Ab: <0.1 s/co ratio (ref 0.0–0.9)

## 2017-06-07 LAB — HIV ANTIBODY (ROUTINE TESTING W REFLEX): HIV Screen 4th Generation wRfx: NONREACTIVE

## 2017-06-09 LAB — UA/M W/RFLX CULTURE, ROUTINE
BILIRUBIN UA: NEGATIVE
Glucose, UA: NEGATIVE
KETONES UA: NEGATIVE
NITRITE UA: NEGATIVE
Protein, UA: NEGATIVE
RBC UA: NEGATIVE
Specific Gravity, UA: <1.005 — ABNORMAL LOW (ref 1.005–1.030)
UUROB: 0.2 mg/dL (ref 0.2–1.0)
pH, UA: 5.5 (ref 5.0–7.5)

## 2017-06-09 LAB — URINE CULTURE, REFLEX

## 2017-06-09 LAB — MICROALBUMIN, URINE WAIVED
CREATININE, URINE WAIVED: 100 mg/dL (ref 10–300)
Microalb, Ur Waived: 10 mg/L (ref 0–19)
Microalb/Creat Ratio: <30 mg/g (ref <30)

## 2017-06-09 LAB — MICROSCOPIC EXAMINATION
Bacteria, UA: NONE SEEN
RBC, UA: NONE SEEN /hpf (ref 0–?)

## 2017-06-10 ENCOUNTER — Telehealth: Payer: Self-pay | Admitting: Family Medicine

## 2017-06-10 ENCOUNTER — Telehealth: Payer: Self-pay

## 2017-06-10 MED ORDER — NITROFURANTOIN MONOHYD MACRO 100 MG PO CAPS: 100.0000 mg | ORAL_CAPSULE | Freq: Two times a day (BID) | ORAL | 0 refills | Status: DC

## 2017-06-10 NOTE — Telephone Encounter (Signed)
Patient notified, she does not want to go to the allergy doctor right now.

## 2017-06-10 NOTE — Telephone Encounter (Signed)
Patient would like to know if there is a way that you can check for mold in her body.  She states that the pharmacy can smell it when she goes to pick up her medication.

## 2017-06-10 NOTE — Telephone Encounter (Signed)
Please let her know that her labs came back nice and normal except her urine. It looks like she has a UTI! So I've sent some medicine to her pharmacy and this should get her feeling better. Thanks!

## 2017-06-10 NOTE — Telephone Encounter (Signed)
Patient notified

## 2017-06-10 NOTE — Telephone Encounter (Signed)
There is no way to check for mold in her body- but we can send her to the allergist to see if she is allergic to mold. Let me know if she'd like me to put in the referral.

## 2017-06-11 ENCOUNTER — Other Ambulatory Visit: Payer: Self-pay | Admitting: Nurse Practitioner

## 2017-06-11 ENCOUNTER — Ambulatory Visit
Admission: RE | Admit: 2017-06-11 | Discharge: 2017-06-11 | Disposition: A | Discharge status: Home / Self Care | Payer: Medicare Other | Source: Ambulatory Visit | Attending: Nurse Practitioner | Admitting: Nurse Practitioner

## 2017-06-11 ENCOUNTER — Encounter: Payer: Self-pay | Admitting: Nurse Practitioner

## 2017-06-11 ENCOUNTER — Ambulatory Visit (HOSPITAL_BASED_OUTPATIENT_CLINIC_OR_DEPARTMENT_OTHER): Payer: Medicare Other | Admitting: Nurse Practitioner

## 2017-06-11 VITALS — BP 151/74 | HR 55 | Temp 98.2°F | Ht 65.0 in | Wt 160.0 lb

## 2017-06-11 DIAGNOSIS — Z79899 Other long term (current) drug therapy: Secondary | ICD-10-CM | POA: Diagnosis not present

## 2017-06-11 DIAGNOSIS — M545 Low back pain, unspecified: Secondary | ICD-10-CM

## 2017-06-11 DIAGNOSIS — M47816 Spondylosis without myelopathy or radiculopathy, lumbar region: Secondary | ICD-10-CM | POA: Diagnosis not present

## 2017-06-11 DIAGNOSIS — M25562 Pain in left knee: Secondary | ICD-10-CM | POA: Insufficient documentation

## 2017-06-11 DIAGNOSIS — G894 Chronic pain syndrome: Secondary | ICD-10-CM | POA: Insufficient documentation

## 2017-06-11 DIAGNOSIS — M533 Sacrococcygeal disorders, not elsewhere classified: Secondary | ICD-10-CM | POA: Insufficient documentation

## 2017-06-11 DIAGNOSIS — G8929 Other chronic pain: Secondary | ICD-10-CM

## 2017-06-11 DIAGNOSIS — Z7982 Long term (current) use of aspirin: Secondary | ICD-10-CM | POA: Insufficient documentation

## 2017-06-11 DIAGNOSIS — Z9889 Other specified postprocedural states: Secondary | ICD-10-CM | POA: Insufficient documentation

## 2017-06-11 DIAGNOSIS — M79604 Pain in right leg: Secondary | ICD-10-CM

## 2017-06-11 DIAGNOSIS — Z79891 Long term (current) use of opiate analgesic: Secondary | ICD-10-CM | POA: Insufficient documentation

## 2017-06-11 DIAGNOSIS — M79602 Pain in left arm: Secondary | ICD-10-CM

## 2017-06-11 DIAGNOSIS — Z792 Long term (current) use of antibiotics: Secondary | ICD-10-CM | POA: Diagnosis not present

## 2017-06-11 DIAGNOSIS — M4316 Spondylolisthesis, lumbar region: Secondary | ICD-10-CM | POA: Diagnosis not present

## 2017-06-11 DIAGNOSIS — M79605 Pain in left leg: Secondary | ICD-10-CM

## 2017-06-11 DIAGNOSIS — Z992 Dependence on renal dialysis: Secondary | ICD-10-CM | POA: Insufficient documentation

## 2017-06-11 DIAGNOSIS — M79603 Pain in arm, unspecified: Secondary | ICD-10-CM

## 2017-06-11 DIAGNOSIS — Z90710 Acquired absence of both cervix and uterus: Secondary | ICD-10-CM | POA: Diagnosis not present

## 2017-06-11 DIAGNOSIS — M542 Cervicalgia: Secondary | ICD-10-CM

## 2017-06-11 DIAGNOSIS — K219 Gastro-esophageal reflux disease without esophagitis: Secondary | ICD-10-CM | POA: Diagnosis not present

## 2017-06-11 DIAGNOSIS — M79601 Pain in right arm: Secondary | ICD-10-CM | POA: Insufficient documentation

## 2017-06-11 DIAGNOSIS — M25511 Pain in right shoulder: Secondary | ICD-10-CM

## 2017-06-11 DIAGNOSIS — F329 Major depressive disorder, single episode, unspecified: Secondary | ICD-10-CM | POA: Diagnosis not present

## 2017-06-11 DIAGNOSIS — Z87891 Personal history of nicotine dependence: Secondary | ICD-10-CM | POA: Diagnosis not present

## 2017-06-11 DIAGNOSIS — F1190 Opioid use, unspecified, uncomplicated: Secondary | ICD-10-CM | POA: Insufficient documentation

## 2017-06-11 DIAGNOSIS — I10 Essential (primary) hypertension: Secondary | ICD-10-CM | POA: Diagnosis not present

## 2017-06-11 HISTORY — DX: Cervicalgia: M54.2

## 2017-06-11 HISTORY — DX: Pain in arm, unspecified: M79.603

## 2017-06-11 HISTORY — DX: Pain in right leg: M79.604

## 2017-06-11 HISTORY — DX: Other chronic pain: G89.29

## 2017-06-11 HISTORY — DX: Pain in right shoulder: M25.511

## 2017-06-11 NOTE — Patient Instructions (Addendum)
____________________________________________________________________________________________  Appointment Policy Summary  It is our goal and responsibility to provide the medical community with assistance in the evaluation and management of patients with chronic pain. Unfortunately our resources are limited. Because we do not have an unlimited amount of time, or available appointments, we are required to closely monitor and manage their use. The following rules exist to maximize their use:  Patient's responsibilities: 1. Punctuality:  At what time should I arrive? You should be physically present in our office 30 minutes before your scheduled appointment. Your scheduled appointment is with your assigned healthcare provider. However, it takes 5-10 minutes to be "checked-in", and another 15 minutes for the nurses to do the admission. If you arrive to our office at the time you were given for your appointment, you will end up being at least 20-25 minutes late to your appointment with the provider. 2. Tardiness:  What happens if I arrive only a few minutes after my scheduled appointment time? You will need to reschedule your appointment. The cutoff is your appointment time. This is why it is so important that you arrive at least 30 minutes before that appointment. If you have an appointment scheduled for 10:00 AM and you arrive at 10:01, you will be required to reschedule your appointment.  3. Plan ahead:  Always assume that you will encounter traffic on your way in. Plan for it. If you are dependent on a driver, make sure they understand these rules and the need to arrive early. 4. Other appointments and responsibilities:  Avoid scheduling any other appointments before or after your pain clinic appointments.  5. Be prepared:  Write down everything that you need to discuss with your healthcare provider and give this information to the admitting nurse. Write down the medications that you will need  refilled. Bring your pills and bottles (even the empty ones), to all of your appointments, except for those where a procedure is scheduled. 6. No children or pets:  Find someone to take care of them. It is not appropriate to bring them in. 7. Scheduling changes:  We request "advanced notification" of any changes or cancellations. 8. Advanced notification:  Defined as a time period of more than 24 hours prior to the originally scheduled appointment. This allows for the appointment to be offered to other patients. 9. Rescheduling:  When a visit is rescheduled, it will require the cancellation of the original appointment. For this reason they both fall within the category of "Cancellations".  10. Cancellations:  They require advanced notification. Any cancellation less than 24 hours before the  appointment will be recorded as a "No Show". 11. No Show:  Defined as an unkept appointment where the patient failed to notify or declare to the practice their intention or inability to keep the appointment.  Corrective process for repeat offenders:  1. Tardiness: Three (3) episodes of rescheduling due to late arrivals will be recorded as one (1) "No Show". 2. Cancellation or reschedule: Three (3) cancellations or rescheduling will be recorded as one (1) "No Show". 3. "No Shows": Three (3) "No Shows" within a 12 month period will result in discharge from the practice.  ____________________________________________________________________________________________  ____________________________________________________________________________________________  Pain Scale  Introduction: The pain score used by this practice is the Verbal Numerical Rating Scale (VNRS-11). This is an 11-point scale. It is for adults and children 10 years or older. There are significant differences in how the pain score is reported, used, and applied. Forget everything you learned in the past and  learn this scoring  system.  General Information: The scale should reflect your current level of pain. Unless you are specifically asked for the level of your worst pain, or your average pain. If you are asked for one of these two, then it should be understood that it is over the past 24 hours.  Basic Activities of Daily Living (ADL): Personal hygiene, dressing, eating, transferring, and using restroom.  Instructions: Most patients tend to report their level of pain as a combination of two factors, their physical pain and their psychosocial pain. This last one is also known as "suffering" and it is reflection of how physical pain affects you socially and psychologically. From now on, report them separately. From this point on, when asked to report your pain level, report only your physical pain. Use the following table for reference.  Pain Clinic Pain Levels (0-5/10)  Pain Level Score  Description  No Pain 0   Mild pain 1 Nagging, annoying, but does not interfere with basic activities of daily living (ADL). Patients are able to eat, bathe, get dressed, toileting (being able to get on and off the toilet and perform personal hygiene functions), transfer (move in and out of bed or a chair without assistance), and maintain continence (able to control bladder and bowel functions). Blood pressure and heart rate are unaffected. A normal heart rate for a healthy adult ranges from 60 to 100 bpm (beats per minute).   Mild to moderate pain 2 Noticeable and distracting. Impossible to hide from other people. More frequent flare-ups. Still possible to adapt and function close to normal. It can be very annoying and may have occasional stronger flare-ups. With discipline, patients may get used to it and adapt.   Moderate pain 3 Interferes significantly with activities of daily living (ADL). It becomes difficult to feed, bathe, get dressed, get on and off the toilet or to perform personal hygiene functions. Difficult to get in and out of  bed or a chair without assistance. Very distracting. With effort, it can be ignored when deeply involved in activities.   Moderately severe pain 4 Impossible to ignore for more than a few minutes. With effort, patients may still be able to manage work or participate in some social activities. Very difficult to concentrate. Signs of autonomic nervous system discharge are evident: dilated pupils (mydriasis); mild sweating (diaphoresis); sleep interference. Heart rate becomes elevated (>115 bpm). Diastolic blood pressure (lower number) rises above 100 mmHg. Patients find relief in laying down and not moving.   Severe pain 5 Intense and extremely unpleasant. Associated with frowning face and frequent crying. Pain overwhelms the senses.  Ability to do any activity or maintain social relationships becomes significantly limited. Conversation becomes difficult. Pacing back and forth is common, as getting into a comfortable position is nearly impossible. Pain wakes you up from deep sleep. Physical signs will be obvious: pupillary dilation; increased sweating; goosebumps; brisk reflexes; cold, clammy hands and feet; nausea, vomiting or dry heaves; loss of appetite; significant sleep disturbance with inability to fall asleep or to remain asleep. When persistent, significant weight loss is observed due to the complete loss of appetite and sleep deprivation.  Blood pressure and heart rate becomes significantly elevated. Caution: If elevated blood pressure triggers a pounding headache associated with blurred vision, then the patient should immediately seek attention at an urgent or emergency care unit, as these may be signs of an impending stroke.    Emergency Department Pain Levels (6-10/10)  Emergency Room Pain 6  Severely limiting. Requires emergency care and should not be seen or managed at an outpatient pain management facility. Communication becomes difficult and requires great effort. Assistance to reach the  emergency department may be required. Facial flushing and profuse sweating along with potentially dangerous increases in heart rate and blood pressure will be evident.   Distressing pain 7 Self-care is very difficult. Assistance is required to transport, or use restroom. Assistance to reach the emergency department will be required. Tasks requiring coordination, such as bathing and getting dressed become very difficult.   Disabling pain 8 Self-care is no longer possible. At this level, pain is disabling. The individual is unable to do even the most "basic" activities such as walking, eating, bathing, dressing, transferring to a bed, or toileting. Fine motor skills are lost. It is difficult to think clearly.   Incapacitating pain 9 Pain becomes incapacitating. Thought processing is no longer possible. Difficult to remember your own name. Control of movement and coordination are lost.   The worst pain imaginable 10 At this level, most patients pass out from pain. When this level is reached, collapse of the autonomic nervous system occurs, leading to a sudden drop in blood pressure and heart rate. This in turn results in a temporary and dramatic drop in blood flow to the brain, leading to a loss of consciousness. Fainting is one of the body's self defense mechanisms. Passing out puts the brain in a calmed state and causes it to shut down for a while, in order to begin the healing process.    Summary: 1. Refer to this scale when providing [place] with your pain level. 2. Be accurate and careful when reporting your pain level. This will help with your care. 3. Over-reporting your pain level will lead to loss of credibility. 4. Even a level of 1/10 means that there is pain and will be treated at our facility. 5. High, inaccurate reporting will be documented as "Symptom Exaggeration", leading to loss of credibility and suspicions of possible secondary gains such as obtaining more narcotics, or wanting to appear  disabled, for fraudulent reasons. 6. Only pain levels of 5 or below will be seen at our facility. 7. Pain levels of 6 and above will be sent to the Emergency Department and the appointment cancelled. ____________________________________________________________________________________________   BMI Assessment: Estimated body mass index is 26.63 kg/m as calculated from the following:   Height as of this encounter: 5\' 5"  (1.651 m).   Weight as of this encounter: 160 lb (72.6 kg).  BMI interpretation table: BMI level Category Range association with higher incidence of chronic pain  <18 kg/m2 Underweight   18.5-24.9 kg/m2 Ideal body weight   25-29.9 kg/m2 Overweight Increased incidence by 20%  30-34.9 kg/m2 Obese (Class I) Increased incidence by 68%  35-39.9 kg/m2 Severe obesity (Class II) Increased incidence by 136%  >40 kg/m2 Extreme obesity (Class III) Increased incidence by 254%   BMI Readings from Last 4 Encounters:  06/11/17 26.63 kg/m  06/06/17 26.38 kg/m  03/26/17 26.63 kg/m  03/21/17 26.63 kg/m   Wt Readings from Last 4 Encounters:  06/11/17 160 lb (72.6 kg)  06/06/17 160 lb (72.6 kg)  03/26/17 160 lb (72.6 kg)  03/21/17 160 lb (72.6 kg)

## 2017-06-11 NOTE — Progress Notes (Signed)
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 [id number] Bone Pathology Markers Lab Results  Component Value Date   ALKPHOS 147 (H) 06/06/2017   CALCIUM 9.6 06/06/2017   Coagulation Parameters Lab Results  Component Value Date   INR 1.06 03/21/2017   LABPROT 13.8 03/21/2017   APTT 28 03/21/2017   PLT 329 06/06/2017   Cardiovascular Markers Lab Results  Component Value Date   HGB 12.1 06/06/2017   HCT 36.5 06/06/2017   Note: Lab results reviewed.  PFSH  Drug: Ms. Lininger  reports that she does not use drugs. Alcohol:  reports that she does not drink alcohol. Tobacco:  reports that she has quit smoking. Her smoking use included Cigarettes. She smoked 1.00 pack per day. She has never used smokeless tobacco. Medical:  has a past medical history of Anxiety; Arthritis; Depression; Dyspnea; GERD (gastroesophageal reflux disease); Headache; UTI (urinary tract infection); and  Hypertension. Family: family history includes Hypertension in her mother.  Past Surgical History:  Procedure Laterality Date  . ABDOMINAL HYSTERECTOMY    . APPENDECTOMY    . BREAST REDUCTION SURGERY Bilateral   . DILATION AND CURETTAGE OF UTERUS    . SHOULDER ARTHROSCOPY WITH OPEN ROTATOR CUFF REPAIR AND DISTAL CLAVICLE ACROMINECTOMY Right 03/26/2017   Procedure: right shoulder arthroscopy, arthroscopic subacromial decompression, distal clavicle excision, mini open rotator cuff repair;  Surgeon: Thornton Park, MD;  Location: [facility];  Service: Orthopedics;  Laterality: Right;   Active Ambulatory Problems    Diagnosis Date Noted  . S/P right rotator cuff repair 03/26/2017  . Anxiety   . Depression   . GERD (gastroesophageal reflux disease)   . Hypertension   . Chronic bilateral low back pain without sciatica  (Secondary Area of Pain) (R>L) 06/06/2017  . Inflammation of joint of shoulder region 01/15/2017  . Long term current use of opiate analgesic 06/11/2017  . Long term prescription opiate use 06/11/2017  . Opiate use 06/11/2017  .  Chronic pain syndrome 06/11/2017  . Chronic neck pain (Primary Area of Pain) (R>L) 06/11/2017  . Chronic right shoulder pain (Tertiary Area of Pain) 06/11/2017  . Chronic pain of lower extremity (Fourth Area of Pain) (R>L) 06/11/2017  . Chronic upper extremity pain  (Bilateral)(R>L) 06/11/2017  . Chronic sacroiliac joint pain 06/11/2017   Resolved Ambulatory Problems    Diagnosis Date Noted  . No Resolved Ambulatory Problems   Past Medical History:  Diagnosis Date  . Anxiety   . Arthritis   . Depression   . Dyspnea   . GERD (gastroesophageal reflux disease)   . Headache   . Hx: UTI (urinary tract infection)   . Hypertension    Constitutional Exam  General appearance: Well nourished, well developed, and well hydrated. In no apparent acute distress Vitals:   06/11/17 1021  BP: (!) 151/74  Pulse: (!) 55  Temp: 98.2 F (36.8 C)  SpO2:  98%  Weight: 160 lb (72.6 kg)  Height: 5' 5" (1.651 m)   BMI Assessment: Estimated body mass index is 26.63 kg/m as calculated from the following:   Height as of this encounter: 5' 5" (1.651 m).   Weight as of this encounter: 160 lb (72.6 kg).  BMI interpretation table: BMI level Category Range association with higher incidence of chronic pain  <18 kg/m2 Underweight   18.5-24.9 kg/m2 Ideal body weight   25-29.9 kg/m2 Overweight Increased incidence by 20%  30-34.9 kg/m2 Obese (Class I) Increased incidence by 68%  35-39.9 kg/m2 Severe obesity (Class II) Increased incidence by 136%  >40 kg/m2 Extreme obesity (Class III) Increased incidence by 254%   BMI Readings from Last 4 Encounters:  06/11/17 26.63 kg/m  06/06/17 26.38 kg/m  03/26/17 26.63 kg/m  03/21/17 26.63 kg/m   Wt Readings from Last 4 Encounters:  06/11/17 160 lb (72.6 kg)  06/06/17 160 lb (72.6 kg)  03/26/17 160 lb (72.6 kg)  03/21/17 160 lb (72.6 kg)  Psych/Mental status: Alert, oriented x 3 (person, place, & time)       Eyes: PERLA Respiratory: No evidence of acute respiratory distress  Cervical Spine Exam  Inspection: No masses, redness, or swelling Alignment: Symmetrical Functional ROM: Unrestricted ROM      Stability: No instability detected Muscle strength & Tone: Functionally intact Sensory: Unimpaired Palpation: Complains of area being tender to palpation Cervical compression test for cervical facet disease  Upper Extremity (UE) Exam    Side: Right upper extremity  Side: Left upper extremity  Inspection: well-healed scar from previous surgery  Inspection: No masses, redness, swelling, or asymmetry. No contractures  Functional ROM: Pain restricted ROM          Functional ROM: Unrestricted ROM          Muscle strength & Tone: Normal strength (5/5)  Muscle strength & Tone: Normal strength (5/5)  Sensory: Unimpaired  Sensory: Unimpaired  Palpation: No palpable anomalies              Palpation: No  palpable anomalies              Specialized Test(s): Deferred         Specialized Test(s): Deferred          Thoracic Spine Exam  Inspection: No masses, redness, or swelling Alignment: Symmetrical Functional ROM: Unrestricted ROM Stability: No instability detected Sensory: Unimpaired Muscle strength & Tone: Non-tender  Lumbar Spine Exam  Inspection: No masses, redness, or swelling Alignment: Symmetrical Functional ROM: Unrestricted ROM  Stability: No instability detected Muscle strength & Tone: Functionally intact Sensory: Unimpaired Palpation: Complains of area being tender to palpation       Provocative Tests: Lumbar Hyperextension and rotation test: Positive bilaterally for facet joint pain. Patrick's Maneuver: Positive for bilateral S-I arthralgia              Gait & Posture Assessment  Ambulation: Unassisted Gait: Relatively normal for age and body habitus Posture: WNL   Lower Extremity Exam    Side: Right lower extremity  Side: Left lower extremity  Inspection: No masses, redness, swelling, or asymmetry. No contractures  Inspection: No masses, redness, swelling, or asymmetry. No contractures  Functional ROM: leg lifts positive less than 45          Functional ROM: Unrestricted ROM          Muscle strength & Tone: Able to Toe-walk & Heel-walk without problems  Muscle strength & Tone: Able to Toe-walk & Heel-walk without problems  Sensory: Unimpaired  Sensory: Unimpaired  Palpation: No palpable anomalies  Palpation: No palpable anomalies   Assessment  Primary Diagnosis & Pertinent Problem List: The primary encounter diagnosis was Chronic neck pain. Diagnoses of Chronic bilateral low back pain without sciatica  (Secondary Area of Pain) (R>L), Chronic right shoulder pain, Chronic pain of both lower extremities, Chronic pain of both upper extremities, Chronic pain syndrome, Long term current use of opiate analgesic, and Chronic sacroiliac joint pain were also pertinent to  this visit.  Visit Diagnosis: 1. Chronic neck pain   2. Chronic bilateral low back pain without sciatica  (Secondary Area of Pain) (R>L)   3. Chronic right shoulder pain   4. Chronic pain of both lower extremities   5. Chronic pain of both upper extremities   6. Chronic pain syndrome   7. Long term current use of opiate analgesic   8. Chronic sacroiliac joint pain    Plan of Care  Initial treatment plan:  Please be advised that as per protocol, today's visit has been an evaluation only. We have not taken over the patient's controlled substance management.  Problem-specific plan: No problem-specific Assessment & Plan notes found for this encounter.  Ordered Lab-work, Procedure(s), Referral(s), & Consult(s): Orders Placed This Encounter  Procedures  . DG Lumb Spine Flex&Ext Only  . DG Si Joints  . Compliance Drug Analysis, Ur  . Comprehensive metabolic panel  . C-reactive protein  . Sedimentation rate  . Magnesium  . 25-Hydroxyvitamin D Lcms D2+D3  . Vitamin B12  . Ambulatory referral to Psychology   Pharmacotherapy: Medications ordered:  No orders of the defined types were placed in this encounter.  Medications administered during this visit: Ms. Camposano had no medications administered during this visit.   Pharmacotherapy under consideration:  Opioid Analgesics: The patient was informed that there is no guarantee that she would be a candidate for opioid analgesics. The decision will be made following CDC guidelines. This decision will be based on the results of diagnostic studies, as well as Ms. Alberg's risk profile.  Membrane stabilizer: To be determined at a later time Muscle relaxant: To be determined at a later time NSAID: To be determined at a later time Other analgesic(s): To be determined at a later time   Interventional therapies under consideration: Ms. Lesmeister was informed that there is no guarantee that she would be a candidate for interventional therapies. The  decision will be based on the results of diagnostic studies, as well as Ms. Hardigree's risk profile.  Possible procedure(s): Possible diagnostic bilateral cervical epidural steroid injection Possible diagnostic bilateral cervical facet block Bilateral cervical radiofrequency ablation Possible diagnostic Right suprascapular nerve block Right suprascapular radiofrequency ablation Possible diagnostic bilateral lumbar epidural steroid injection Possible diagnostic bilateral lumbar facet block Bilateral lumbar radiofrequency ablation   Provider-requested follow-up: Return for 2nd Visit, w/ Dr. Dossie Arbour, after [facility] eval.  Future Appointments Date Time Provider [place]  06/20/2017 10:45 AM Gloria Roys, DO [facility] None    Primary Care Physician: Gloria Roys, DO Location: [facility] Note by:  Date: 06/11/2017; Time: 3:51 PM  Pain Score Disclaimer: We use the NRS-11 scale. This is a self-reported, subjective measurement of pain severity with only modest accuracy. It is used primarily to identify changes within a particular patient. It must be understood that outpatient pain scales are significantly less accurate that those used for research, where they can be applied under ideal controlled circumstances with minimal exposure to variables. In reality, the score is likely to be a combination of pain intensity and pain affect, where pain affect describes the degree of emotional arousal or changes in action readiness caused by the sensory experience of pain. Factors such as social and work situation, setting, emotional state, anxiety levels, expectation, and prior pain experience may influence pain perception and show large inter-individual differences that may also be affected by time variables.  Patient instructions provided during this appointment: Patient Instructions     ____________________________________________________________________________________________  Appointment Policy Summary  It is our goal and responsibility to provide the medical community with assistance in the evaluation and management of patients with chronic pain. Unfortunately our resources are limited. Because we do not have an unlimited amount of time, or available appointments, we are required to closely monitor and manage their use. The following rules exist to maximize their use:  Patient's responsibilities: 1. Punctuality:  At what time should I arrive? You should be physically present in our office 30 minutes before your scheduled appointment. Your scheduled appointment is with your assigned healthcare provider. However, it takes 5-10 minutes to be "checked-in", and another 15 minutes for the nurses to do the admission. If you arrive to our office at the time you were given for your appointment, you will end up being at least 20-25 minutes late to your appointment with the provider. 2. Tardiness:  What happens if I arrive only a few minutes after my scheduled appointment time? You will need to reschedule your appointment. The cutoff is your appointment time. This is why it is so important that you arrive at least 30 minutes before that appointment. If you have an appointment scheduled for 10:00 AM and you arrive at 10:01, you will be required to reschedule your appointment.  3. Plan ahead:  Always assume that you will encounter traffic on your way in. Plan for it. If you are dependent on a driver, make sure they understand these rules and the need to arrive early. 4. Other appointments and responsibilities:  Avoid scheduling any other appointments before or after your pain clinic appointments.  5. Be prepared:  Write down everything that you need to discuss with your healthcare provider and give this information to the admitting nurse. Write down the medications that you will need  refilled. Bring your pills and bottles (even the empty ones), to all of your appointments, except for those where a procedure is scheduled. 6. No children or pets:  Find someone to take care of them. It is not appropriate to  bring them in. 7. Scheduling changes:  We request "advanced notification" of any changes or cancellations. 8. Advanced notification:  Defined as a time period of more than 24 hours prior to the originally scheduled appointment. This allows for the appointment to be offered to other patients. 9. Rescheduling:  When a visit is rescheduled, it will require the cancellation of the original appointment. For this reason they both fall within the category of "Cancellations".  10. Cancellations:  They require advanced notification. Any cancellation less than 24 hours before the  appointment will be recorded as a "No Show". 11. No Show:  Defined as an unkept appointment where the patient failed to notify or declare to the practice their intention or inability to keep the appointment.  Corrective process for repeat offenders:  1. Tardiness: Three (3) episodes of rescheduling due to late arrivals will be recorded as one (1) "No Show". 2. Cancellation or reschedule: Three (3) cancellations or rescheduling will be recorded as one (1) "No Show". 3. "No Shows": Three (3) "No Shows" within a 12 month period will result in discharge from the practice.  ____________________________________________________________________________________________  ____________________________________________________________________________________________  Pain Scale  Introduction: The pain score used by this practice is the Verbal Numerical Rating Scale (VNRS-11). This is an 11-point scale. It is for adults and children 10 years or older. There are significant differences in how the pain score is reported, used, and applied. Forget everything you learned in the past and learn this scoring  system.  General Information: The scale should reflect your current level of pain. Unless you are specifically asked for the level of your worst pain, or your average pain. If you are asked for one of these two, then it should be understood that it is over the past 24 hours.  Basic Activities of Daily Living (ADL): Personal hygiene, dressing, eating, transferring, and using restroom.  Instructions: Most patients tend to report their level of pain as a combination of two factors, their physical pain and their psychosocial pain. This last one is also known as "suffering" and it is reflection of how physical pain affects you socially and psychologically. From now on, report them separately. From this point on, when asked to report your pain level, report only your physical pain. Use the following table for reference.  Pain Clinic Pain Levels (0-5/10)  Pain Level Score  Description  No Pain 0   Mild pain 1 Nagging, annoying, but does not interfere with basic activities of daily living (ADL). Patients are able to eat, bathe, get dressed, toileting (being able to get on and off the toilet and perform personal hygiene functions), transfer (move in and out of bed or a chair without assistance), and maintain continence (able to control bladder and bowel functions). Blood pressure and heart rate are unaffected. A normal heart rate for a healthy adult ranges from 60 to 100 bpm (beats per minute).   Mild to moderate pain 2 Noticeable and distracting. Impossible to hide from other people. More frequent flare-ups. Still possible to adapt and function close to normal. It can be very annoying and may have occasional stronger flare-ups. With discipline, patients may get used to it and adapt.   Moderate pain 3 Interferes significantly with activities of daily living (ADL). It becomes difficult to feed, bathe, get dressed, get on and off the toilet or to perform personal hygiene functions. Difficult to get in and out of  bed or a chair without assistance. Very distracting. With effort, it can be ignored  when deeply involved in activities.   Moderately severe pain 4 Impossible to ignore for more than a few minutes. With effort, patients may still be able to manage work or participate in some social activities. Very difficult to concentrate. Signs of autonomic nervous system discharge are evident: dilated pupils (mydriasis); mild sweating (diaphoresis); sleep interference. Heart rate becomes elevated (>115 bpm). Diastolic blood pressure (lower number) rises above 100 mmHg. Patients find relief in laying down and not moving.   Severe pain 5 Intense and extremely unpleasant. Associated with frowning face and frequent crying. Pain overwhelms the senses.  Ability to do any activity or maintain social relationships becomes significantly limited. Conversation becomes difficult. Pacing back and forth is common, as getting into a comfortable position is nearly impossible. Pain wakes you up from deep sleep. Physical signs will be obvious: pupillary dilation; increased sweating; goosebumps; brisk reflexes; cold, clammy hands and feet; nausea, vomiting or dry heaves; loss of appetite; significant sleep disturbance with inability to fall asleep or to remain asleep. When persistent, significant weight loss is observed due to the complete loss of appetite and sleep deprivation.  Blood pressure and heart rate becomes significantly elevated. Caution: If elevated blood pressure triggers a pounding headache associated with blurred vision, then the patient should immediately seek attention at an urgent or emergency care unit, as these may be signs of an impending stroke.    Emergency Department Pain Levels (6-10/10)  Emergency Room Pain 6 Severely limiting. Requires emergency care and should not be seen or managed at an outpatient pain management facility. Communication becomes difficult and requires great effort. Assistance to reach the  emergency department may be required. Facial flushing and profuse sweating along with potentially dangerous increases in heart rate and blood pressure will be evident.   Distressing pain 7 Self-care is very difficult. Assistance is required to transport, or use restroom. Assistance to reach the emergency department will be required. Tasks requiring coordination, such as bathing and getting dressed become very difficult.   Disabling pain 8 Self-care is no longer possible. At this level, pain is disabling. The individual is unable to do even the most "basic" activities such as walking, eating, bathing, dressing, transferring to a bed, or toileting. Fine motor skills are lost. It is difficult to think clearly.   Incapacitating pain 9 Pain becomes incapacitating. Thought processing is no longer possible. Difficult to remember your own name. Control of movement and coordination are lost.   The worst pain imaginable 10 At this level, most patients pass out from pain. When this level is reached, collapse of the autonomic nervous system occurs, leading to a sudden drop in blood pressure and heart rate. This in turn results in a temporary and dramatic drop in blood flow to the brain, leading to a loss of consciousness. Fainting is one of the body's self defense mechanisms. Passing out puts the brain in a calmed state and causes it to shut down for a while, in order to begin the healing process.    Summary: 1. Refer to this scale when providing [place] with your pain level. 2. Be accurate and careful when reporting your pain level. This will help with your care. 3. Over-reporting your pain level will lead to loss of credibility. 4. Even a level of 1/10 means that there is pain and will be treated at our facility. 5. High, inaccurate reporting will be documented as "Symptom Exaggeration", leading to loss of credibility and suspicions of possible secondary gains such as obtaining more  narcotics, or wanting to appear  disabled, for fraudulent reasons. 6. Only pain levels of 5 or below will be seen at our facility. 7. Pain levels of 6 and above will be sent to the Emergency Department and the appointment cancelled. ____________________________________________________________________________________________   BMI Assessment: Estimated body mass index is 26.63 kg/m as calculated from the following:   Height as of this encounter: 5' 5" (1.651 m).   Weight as of this encounter: 160 lb (72.6 kg).  BMI interpretation table: BMI level Category Range association with higher incidence of chronic pain  <18 kg/m2 Underweight   18.5-24.9 kg/m2 Ideal body weight   25-29.9 kg/m2 Overweight Increased incidence by 20%  30-34.9 kg/m2 Obese (Class I) Increased incidence by 68%  35-39.9 kg/m2 Severe obesity (Class II) Increased incidence by 136%  >40 kg/m2 Extreme obesity (Class III) Increased incidence by 254%   BMI Readings from Last 4 Encounters:  06/11/17 26.63 kg/m  06/06/17 26.38 kg/m  03/26/17 26.63 kg/m  03/21/17 26.63 kg/m   Wt Readings from Last 4 Encounters:  06/11/17 160 lb (72.6 kg)  06/06/17 160 lb (72.6 kg)  03/26/17 160 lb (72.6 kg)  03/21/17 160 lb (72.6 kg)

## 2017-06-11 NOTE — Progress Notes (Signed)
Safety precautions to be maintained throughout the outpatient stay will include: orient to surroundings, keep bed in low position, maintain call bell within reach at all times, provide assistance with transfer out of bed and ambulation.  

## 2017-06-12 DIAGNOSIS — M75120 Complete rotator cuff tear or rupture of unspecified shoulder, not specified as traumatic: Secondary | ICD-10-CM

## 2017-06-12 HISTORY — DX: Complete rotator cuff tear or rupture of unspecified shoulder, not specified as traumatic: M75.120

## 2017-06-12 LAB — VITAMIN B12: Vitamin B-12: 751 pg/mL (ref 232–1245)

## 2017-06-12 NOTE — Progress Notes (Signed)
Results were reviewed and found to be: significantly abnormal  No acute injury or pathology identified  Review would suggest interventional pain management techniques may be of benefit

## 2017-06-13 DIAGNOSIS — M450 Ankylosing spondylitis of multiple sites in spine: Secondary | ICD-10-CM | POA: Diagnosis not present

## 2017-06-15 LAB — COMPREHENSIVE METABOLIC PANEL
ALT: 9 IU/L (ref 0–32)
AST: 18 IU/L (ref 0–40)
Albumin/Globulin Ratio: 1.7 (ref 1.2–2.2)
Albumin: 4.3 g/dL (ref 3.6–4.8)
Alkaline Phosphatase: 147 IU/L — ABNORMAL HIGH (ref 39–117)
BUN/Creatinine Ratio: 13 (ref 12–28)
BUN: 11 mg/dL (ref 8–27)
Bilirubin Total: <0.2 mg/dL (ref 0.0–1.2)
CALCIUM: 9.6 mg/dL (ref 8.7–10.3)
CO2: 25 mmol/L (ref 20–29)
CREATININE: 0.82 mg/dL (ref 0.57–1.00)
Chloride: 102 mmol/L (ref 96–106)
GFR calc Af Amer: 84 mL/min/{1.73_m2} (ref >59)
GFR, EST NON AFRICAN AMERICAN: 73 mL/min/{1.73_m2} (ref >59)
GLOBULIN, TOTAL: 2.5 g/dL (ref 1.5–4.5)
Glucose: 74 mg/dL (ref 65–99)
Potassium: 4.3 mmol/L (ref 3.5–5.2)
Sodium: 144 mmol/L (ref 134–144)
Total Protein: 6.8 g/dL (ref 6.0–8.5)

## 2017-06-15 LAB — C-REACTIVE PROTEIN: CRP: 8.5 mg/L — ABNORMAL HIGH (ref 0.0–4.9)

## 2017-06-15 LAB — 25-HYDROXYVITAMIN D LCMS D2+D3
25-HYDROXY, VITAMIN D-3: 24 ng/mL
25-HYDROXY, VITAMIN D: 24 ng/mL — AB

## 2017-06-15 LAB — COMPLIANCE DRUG ANALYSIS, UR

## 2017-06-15 LAB — SEDIMENTATION RATE: SED RATE: 22 mm/h (ref 0–40)

## 2017-06-15 LAB — MAGNESIUM: Magnesium: 2 mg/dL (ref 1.6–2.3)

## 2017-06-17 NOTE — Progress Notes (Signed)
BNZO-UNLISTED NOTE: This forensic urine drug screen (UDS) test was conducted using a state-of-the-art ultra high performance liquid chromatography and mass spectrometry system (UPLC/MS-MS), the most sophisticated and accurate method available. UPLC/MS-MS is 1,000 times more precise and accurate than standard gas chromatography and mass spectrometry (GC/MS). This system can analyze 26 drug categories and 180 drug compounds.  An unreported benzodiazepine was detected in the sample. CDC reports have identified the combination of opioids and benzodiazepines (Valium, Ativan, Xanax, Librium, Tranxene, Klonopin, Dalmane, Halcion,Restoril, etc.) to be associated in a significant number of reported drug-to-drug interactions leading to accidental overdosing leading to respiratory failure and death.

## 2017-06-18 DIAGNOSIS — M25611 Stiffness of right shoulder, not elsewhere classified: Secondary | ICD-10-CM | POA: Diagnosis not present

## 2017-06-18 DIAGNOSIS — M25511 Pain in right shoulder: Secondary | ICD-10-CM | POA: Diagnosis not present

## 2017-06-20 ENCOUNTER — Ambulatory Visit: Payer: Medicare Other | Admitting: Family Medicine

## 2017-06-20 ENCOUNTER — Encounter: Payer: Self-pay | Admitting: Family Medicine

## 2017-06-20 ENCOUNTER — Telehealth: Payer: Self-pay | Admitting: Family Medicine

## 2017-06-20 ENCOUNTER — Ambulatory Visit (INDEPENDENT_AMBULATORY_CARE_PROVIDER_SITE_OTHER): Payer: Medicare Other

## 2017-06-20 VITALS — BP 228/86 | HR 65 | Temp 98.7°F | Resp 16 | Ht 65.0 in | Wt 164.9 lb

## 2017-06-20 VITALS — BP 210/104 | HR 65 | Temp 98.7°F | Wt 164.6 lb

## 2017-06-20 DIAGNOSIS — Z1231 Encounter for screening mammogram for malignant neoplasm of breast: Secondary | ICD-10-CM

## 2017-06-20 DIAGNOSIS — J45909 Unspecified asthma, uncomplicated: Secondary | ICD-10-CM | POA: Insufficient documentation

## 2017-06-20 DIAGNOSIS — I10 Essential (primary) hypertension: Secondary | ICD-10-CM | POA: Diagnosis not present

## 2017-06-20 DIAGNOSIS — Z77120 Contact with and (suspected) exposure to mold (toxic): Secondary | ICD-10-CM

## 2017-06-20 DIAGNOSIS — G589 Mononeuropathy, unspecified: Secondary | ICD-10-CM

## 2017-06-20 DIAGNOSIS — F339 Major depressive disorder, recurrent, unspecified: Secondary | ICD-10-CM | POA: Diagnosis not present

## 2017-06-20 DIAGNOSIS — F4310 Post-traumatic stress disorder, unspecified: Secondary | ICD-10-CM | POA: Insufficient documentation

## 2017-06-20 DIAGNOSIS — M47812 Spondylosis without myelopathy or radiculopathy, cervical region: Secondary | ICD-10-CM

## 2017-06-20 DIAGNOSIS — Z1211 Encounter for screening for malignant neoplasm of colon: Secondary | ICD-10-CM | POA: Diagnosis not present

## 2017-06-20 DIAGNOSIS — J4540 Moderate persistent asthma, uncomplicated: Secondary | ICD-10-CM

## 2017-06-20 DIAGNOSIS — Z0000 Encounter for general adult medical examination without abnormal findings: Secondary | ICD-10-CM | POA: Diagnosis not present

## 2017-06-20 DIAGNOSIS — G894 Chronic pain syndrome: Secondary | ICD-10-CM

## 2017-06-20 DIAGNOSIS — Z13820 Encounter for screening for osteoporosis: Secondary | ICD-10-CM | POA: Diagnosis not present

## 2017-06-20 HISTORY — DX: Mononeuropathy, unspecified: G58.9

## 2017-06-20 LAB — COLOGUARD

## 2017-06-20 MED ORDER — AMLODIPINE BESYLATE 10 MG PO TABS: 10.0000 mg | ORAL_TABLET | Freq: Every day | ORAL | 3 refills | Status: DC

## 2017-06-20 MED ORDER — LOSARTAN POTASSIUM 100 MG PO TABS: 100.0000 mg | ORAL_TABLET | Freq: Every day | ORAL | 3 refills | Status: DC

## 2017-06-20 NOTE — Assessment & Plan Note (Signed)
Has seen pain management. Waiting on psychology appointment. They have not taken over any pain medication at this time.

## 2017-06-20 NOTE — Assessment & Plan Note (Signed)
No better. Still crying a lot. Has only been on the higher dose of sertaline for 2 weeks- will recheck 2 weeks and see how she's doing.

## 2017-06-20 NOTE — Telephone Encounter (Signed)
Records reviewed from previous provider. These records have been reviewed and abstracted and to be scanned into her chart.   04/02/17- patient was confronted by previous PCP for obtaining narcotics from several providers, including her dentist. This had been discussed previously. She was informed that her previous provider would not be providing any more narcotics. She was given tramadol #124 with 3 refills and Klonopin 180 with 1 refill at that appointment.    Given her history of violating a controlled substance agreement, we will not provide any chronic pain medication. She has already seen pain management and is awaiting an appointment with the pain psychologist.    On review of records- PATIENT HAS ANGIOEDEMA FROM ACE-inhibitors and ARBs, she did not remember this and stated that she had no allergies. Pharmacy to be called and losartan cancelled. Will send through amlodipine 10 instead. Attempted to call patient to tell her not to take the losartan. Her voicemail box was not set up.   Caryl-Lyn, would you please call [facility] and cancel her losartan and make sure they know she gets angio-edema from ACEs and ARBs? Thanks!

## 2017-06-20 NOTE — Telephone Encounter (Signed)
Spoke with Wells Guiles the pharmacists at [facility] and let her know to cancel the Losartan for the patient. I let her know the patient gets angio edema from ACE inhibitors and from Angiotension Receptor Blockers. She is aware and will update the patient's record and cancel the medication.

## 2017-06-20 NOTE — Patient Instructions (Addendum)
Gloria Allen , Thank you for taking time to come for your Medicare Wellness Visit. I appreciate your ongoing commitment to your health goals. Please review the following plan we discussed and let me know if I can assist you in the future.   Screening recommendations/referrals: Colonoscopy: due- cologuard ordered Mammogram: due- Please call [phone] to schedule your mammogram.  Bone Density: due - Please call [phone] to schedule Recommended yearly ophthalmology/optometry visit for glaucoma screening and checkup Recommended yearly dental visit for hygiene and checkup  Vaccinations: Influenza vaccine: due now-declined Pneumococcal vaccine: up to date Tdap vaccine: due now, check with your insurance company for coverage Shingles vaccine: due now, check with your insurance company for coverage  Advanced directives: Advance directive discussed with you today. I have provided a copy for you to complete at home and have notarized. Once this is complete please bring a copy in to our office so we can scan it into your chart.  Conditions/risks identified: none  Next appointment: Follow up in one year for your annual wellness exam.    Preventive Care 65 Years and Older, Female Preventive care refers to lifestyle choices and visits with your health care provider that can promote health and wellness. What does preventive care include?  A yearly physical exam. This is also called an annual well check.  Dental exams once or twice a year.  Routine eye exams. Ask your health care provider how often you should have your eyes checked.  Personal lifestyle choices, including:  Daily care of your teeth and gums.  Regular physical activity.  Eating a healthy diet.  Avoiding tobacco and drug use.  Limiting alcohol use.  Practicing safe sex.  Taking low-dose aspirin every day.  Taking vitamin and mineral supplements as recommended by your health care provider. What happens during an  annual well check? The services and screenings done by your health care provider during your annual well check will depend on your age, overall health, lifestyle risk factors, and family history of disease. Counseling  Your health care provider may ask you questions about your:  Alcohol use.  Tobacco use.  Drug use.  Emotional well-being.  Home and relationship well-being.  Sexual activity.  Eating habits.  History of falls.  Memory and ability to understand (cognition).  Work and work Statistician.  Reproductive health. Screening  You may have the following tests or measurements:  Height, weight, and BMI.  Blood pressure.  Lipid and cholesterol levels. These may be checked every 5 years, or more frequently if you are over 68 years old.  Skin check.  Lung cancer screening. You may have this screening every year starting at age 42 if you have a 30-pack-year history of smoking and currently smoke or have quit within the past 15 years.  Fecal occult blood test (FOBT) of the stool. You may have this test every year starting at age 74.  Flexible sigmoidoscopy or colonoscopy. You may have a sigmoidoscopy every 5 years or a colonoscopy every 10 years starting at age 9.  Hepatitis C blood test.  Hepatitis B blood test.  Sexually transmitted disease (STD) testing.  Diabetes screening. This is done by checking your blood sugar (glucose) after you have not eaten for a while (fasting). You may have this done every 1-3 years.  Bone density scan. This is done to screen for osteoporosis. You may have this done starting at age 42.  Mammogram. This may be done every 1-2 years. Talk to your health care provider about  how often you should have regular mammograms. Talk with your health care provider about your test results, treatment options, and if necessary, the need for more tests. Vaccines  Your health care provider may recommend certain vaccines, such as:  Influenza  vaccine. This is recommended every year.  Tetanus, diphtheria, and acellular pertussis (Tdap, Td) vaccine. You may need a Td booster every 10 years.  Zoster vaccine. You may need this after age 45.  Pneumococcal 13-valent conjugate (PCV13) vaccine. One dose is recommended after age 72.  Pneumococcal polysaccharide (PPSV23) vaccine. One dose is recommended after age 62. Talk to your health care provider about which screenings and vaccines you need and how often you need them. This information is not intended to replace advice given to you by your health care provider. Make sure you discuss any questions you have with your health care provider. Document Released: 10/14/2015 Document Revised: 06/06/2016 Document Reviewed: 07/19/2015 Elsevier Interactive Patient Education  2017 [place] Prevention in the Home Falls can cause injuries. They can happen to people of all ages. There are many things you can do to make your home safe and to help prevent falls. What can I do on the outside of my home?  Regularly fix the edges of walkways and driveways and fix any cracks.  Remove anything that might make you trip as you walk through a door, such as a raised step or threshold.  Trim any bushes or trees on the path to your home.  Use bright outdoor lighting.  Clear any walking paths of anything that might make someone trip, such as rocks or tools.  Regularly check to see if handrails are loose or broken. Make sure that both sides of any steps have handrails.  Any raised decks and porches should have guardrails on the edges.  Have any leaves, snow, or ice cleared regularly.  Use sand or salt on walking paths during winter.  Clean up any spills in your garage right away. This includes oil or grease spills. What can I do in the bathroom?  Use night lights.  Install grab bars by the toilet and in the tub and shower. Do not use towel bars as grab bars.  Use non-skid mats or decals  in the tub or shower.  If you need to sit down in the shower, use a plastic, non-slip stool.  Keep the floor dry. Clean up any water that spills on the floor as soon as it happens.  Remove soap buildup in the tub or shower regularly.  Attach bath mats securely with double-sided non-slip rug tape.  Do not have throw rugs and other things on the floor that can make you trip. What can I do in the bedroom?  Use night lights.  Make sure that you have a light by your bed that is easy to reach.  Do not use any sheets or blankets that are too big for your bed. They should not hang down onto the floor.  Have a firm chair that has side arms. You can use this for support while you get dressed.  Do not have throw rugs and other things on the floor that can make you trip. What can I do in the kitchen?  Clean up any spills right away.  Avoid walking on wet floors.  Keep items that you use a lot in easy-to-reach places.  If you need to reach something above you, use a strong step stool that has a grab bar.  Keep  electrical cords out of the way.  Do not use floor polish or wax that makes floors slippery. If you must use wax, use non-skid floor wax.  Do not have throw rugs and other things on the floor that can make you trip. What can I do with my stairs?  Do not leave any items on the stairs.  Make sure that there are handrails on both sides of the stairs and use them. Fix handrails that are broken or loose. Make sure that handrails are as long as the stairways.  Check any carpeting to make sure that it is firmly attached to the stairs. Fix any carpet that is loose or worn.  Avoid having throw rugs at the top or bottom of the stairs. If you do have throw rugs, attach them to the floor with carpet tape.  Make sure that you have a light switch at the top of the stairs and the bottom of the stairs. If you do not have them, ask someone to add them for you. What else can I do to help  prevent falls?  Wear shoes that:  Do not have high heels.  Have rubber bottoms.  Are comfortable and fit you well.  Are closed at the toe. Do not wear sandals.  If you use a stepladder:  Make sure that it is fully opened. Do not climb a closed stepladder.  Make sure that both sides of the stepladder are locked into place.  Ask someone to hold it for you, if possible.  Clearly mark and make sure that you can see:  Any grab bars or handrails.  First and last steps.  Where the edge of each step is.  Use tools that help you move around (mobility aids) if they are needed. These include:  Canes.  Walkers.  Scooters.  Crutches.  Turn on the lights when you go into a dark area. Replace any light bulbs as soon as they burn out.  Set up your furniture so you have a clear path. Avoid moving your furniture around.  If any of your floors are uneven, fix them.  If there are any pets around you, be aware of where they are.  Review your medicines with your doctor. Some medicines can make you feel dizzy. This can increase your chance of falling. Ask your doctor what other things that you can do to help prevent falls. This information is not intended to replace advice given to you by your health care provider. Make sure you discuss any questions you have with your health care provider. Document Released: 07/14/2009 Document Revised: 02/23/2016 Document Reviewed: 10/22/2014 Elsevier Interactive Patient Education  2017 [employer].

## 2017-06-20 NOTE — Progress Notes (Signed)
BP (!) 210/104   Pulse 65   Temp 98.7 F (37.1 C)   Wt 164 lb 9.6 oz (74.7 kg)   SpO2 97%   BMI 27.39 kg/m    Subjective:    Patient ID: Gloria Allen, female    DOB: [birth year], 71 y.o.   MRN: [id number]  HPI: Gloria Allen is a 71 y.o. female  Chief Complaint  Patient presents with  . Follow-up   HYPERTENSION Hypertension status: uncontrolled  Satisfied with current treatment? no Duration of hypertension: chronic BP monitoring frequency:  not checking BP range:  BP medication side effects:  no Medication compliance: good compliance Previous BP meds: clonidine, metoprolol Aspirin: no Recurrent headaches: yes Visual changes: no Palpitations: no Dyspnea: no Chest pain: no Lower extremity edema: no Dizzy/lightheaded: no   DEPRESSION Mood status: uncontrolled Satisfied with current treatment?: no Symptom severity: moderate  Duration of current treatment : 2 weeks Side effects: no Medication compliance: good compliance Psychotherapy/counseling: no  Previous psychiatric medications: zoloft, klonopin, hydroxyzine Depressed mood: yes Anxious mood: yes Anhedonia: no Significant weight loss or gain: no Insomnia: no  Fatigue: yes Feelings of worthlessness or guilt: yes Impaired concentration/indecisiveness: yes Suicidal ideations: no Hopelessness: yes Crying spells: yes Depression screen [facility] 2/9 06/20/2017 06/11/2017 06/06/2017  Decreased Interest 0 3 3  Down, Depressed, Hopeless 3 3 3   PHQ - 2 Score 3 6 6   Altered sleeping 2 3 3   Tired, decreased energy 3 3 3   Change in appetite 3 3 3   Feeling bad or failure about yourself  1 3 3   Trouble concentrating 0 3 3  Moving slowly or fidgety/restless 0 2 3  Suicidal thoughts 0 0 0  PHQ-9 Score 12 23 24   Difficult doing work/chores Somewhat difficult Not difficult at all -     Relevant past medical, surgical, family and social history reviewed and updated as indicated. Interim medical history since our last visit  reviewed. Allergies and medications reviewed and updated.  Review of Systems  Constitutional: Negative.   Respiratory: Negative.   Cardiovascular: Negative.   Psychiatric/Behavioral: Positive for dysphoric mood. Negative for agitation, behavioral problems, confusion, decreased concentration, hallucinations, self-injury, sleep disturbance and suicidal ideas. The patient is nervous/anxious. The patient is not hyperactive.     Per HPI unless specifically indicated above     Objective:    BP (!) 210/104   Pulse 65   Temp 98.7 F (37.1 C)   Wt 164 lb 9.6 oz (74.7 kg)   SpO2 97%   BMI 27.39 kg/m   Wt Readings from Last 3 Encounters:  06/20/17 164 lb 9.6 oz (74.7 kg)  06/20/17 164 lb 14.4 oz (74.8 kg)  06/11/17 160 lb (72.6 kg)    Physical Exam  Constitutional: She is oriented to person, place, and time. She appears well-developed and well-nourished. No distress.  HENT:  Head: Normocephalic and atraumatic.  Right Ear: Hearing normal.  Left Ear: Hearing normal.  Nose: Nose normal.  Eyes: Conjunctivae and lids are normal. Right eye exhibits no discharge. Left eye exhibits no discharge. No scleral icterus.  Cardiovascular: Normal rate, regular rhythm, normal heart sounds and intact distal pulses.  Exam reveals no gallop and no friction rub.   No murmur heard. Pulmonary/Chest: Effort normal and breath sounds normal. No respiratory distress. She has no wheezes. She has no rales. She exhibits no tenderness.  Musculoskeletal: Normal range of motion.  Neurological: She is alert and oriented to person, place, and time.  Skin: Skin is  warm, dry and intact. No rash noted. She is not diaphoretic. No erythema. No pallor.  Psychiatric: She has a normal mood and affect. Her speech is normal and behavior is normal. Judgment and thought content normal. Cognition and memory are normal.  Nursing note and vitals reviewed.   Results for orders placed or performed in visit on 06/11/17  Vitamin B12    Result Value Ref Range   Vitamin B-12 751 232 - 1,245 pg/mL      Assessment & Plan:   Problem List Items Addressed This Visit      Cardiovascular and Mediastinum   Hypertension - Primary    Under poor control. Will add 100mg  of losartan and recheck 2 weeks.       Relevant Medications   losartan (COZAAR) 100 MG tablet     Other   Depression    No better. Still crying a lot. Has only been on the higher dose of sertaline for 2 weeks- will recheck 2 weeks and see how she's doing.       Chronic pain syndrome    Has seen pain management. Waiting on psychology appointment. They have not taken over any pain medication at this time.           Follow up plan: Return 2 weeks, for follow up mood and BP.

## 2017-06-20 NOTE — Addendum Note (Signed)
Addended by: Valerie Roys on: 06/20/2017 02:52 PM   Modules accepted: Orders

## 2017-06-20 NOTE — Assessment & Plan Note (Signed)
Under poor control. Will add 100mg  of losartan and recheck 2 weeks.

## 2017-06-20 NOTE — Progress Notes (Signed)
Subjective:   Gloria Allen is a 71 y.o. female who presents for Medicare Annual (Subsequent) preventive examination.  Review of Systems:  Cardiac Risk Factors include: hypertension;advanced age (>37mn, >>69women);smoking/ tobacco exposure;sedentary lifestyle     Objective:     Vitals: BP (!) 228/86 (BP Location: Right Arm, Cuff Size: Normal)   Pulse 65   Temp 98.7 F (37.1 C)   Resp 16   Ht '5\' 5"'$  (1.651 m)   Wt 164 lb 14.4 oz (74.8 kg)   BMI 27.44 kg/m   Body mass index is 27.44 kg/m.   Tobacco History  Smoking Status  . Former Smoker  . Packs/day: 1.00  . Types: Cigarettes  Smokeless Tobacco  . Never Used    Comment: "quit years ago"     Counseling given: Not Answered   Past Medical History:  Diagnosis Date  . Anxiety   . Arthritis   . Depression   . Dyspnea    with exertion  . GERD (gastroesophageal reflux disease)   . Headache   . Hx: UTI (urinary tract infection)   . Hypertension    Past Surgical History:  Procedure Laterality Date  . ABDOMINAL HYSTERECTOMY    . APPENDECTOMY    . BREAST REDUCTION SURGERY Bilateral   . DILATION AND CURETTAGE OF UTERUS    . SHOULDER ARTHROSCOPY WITH OPEN ROTATOR CUFF REPAIR AND DISTAL CLAVICLE ACROMINECTOMY Right 03/26/2017   Procedure: right shoulder arthroscopy, arthroscopic subacromial decompression, distal clavicle excision, mini open rotator cuff repair;  Surgeon: KThornton Park MD;  Location: [facility] ORS;  Service: Orthopedics;  Laterality: Right;   Family History  Problem Relation Age of Onset  . Hypertension Mother    History  Sexual Activity  . Sexual activity: No    Outpatient Encounter Prescriptions as of 06/20/2017  Medication Sig  . albuterol (PROVENTIL HFA;VENTOLIN HFA) 108 (90 Base) MCG/ACT inhaler Inhale 2 puffs into the lungs every 6 (six) hours as needed for wheezing or shortness of breath.  . clonazePAM (KLONOPIN) 0.5 MG tablet Take 0.5 mg by mouth 2 (two) times daily as needed for anxiety.    . cloNIDine (CATAPRES) 0.3 MG tablet   . cyclobenzaprine (FLEXERIL) 5 MG tablet Take 5 mg by mouth 3 (three) times daily as needed for muscle spasms.  . hydrOXYzine (ATARAX/VISTARIL) 10 MG tablet take 4 tablet by mouth four times a day  . losartan (COZAAR) 100 MG tablet Take 1 tablet (100 mg total) by mouth daily.  . Magnesium Oxide 400 (240 Mg) MG TABS Take 1 tablet by mouth 2 (two) times daily.  . metoprolol succinate (TOPROL-XL) 100 MG 24 hr tablet Take 100 mg by mouth daily. Take with or immediately following a meal.  . mirtazapine (REMERON) 15 MG tablet   . montelukast (SINGULAIR) 10 MG tablet Take 10 mg by mouth at bedtime as needed.   . montelukast (SINGULAIR) 10 MG tablet Take 10 mg by mouth at bedtime.  .Marland Kitchenomeprazole (PRILOSEC) 40 MG capsule Take 40 mg by mouth daily.  . orphenadrine (NORFLEX) 100 MG tablet Take 100 mg by mouth as needed for muscle spasms.  .Marland Kitchenoxybutynin (DITROPAN) 5 MG tablet Take 5 mg by mouth 3 (three) times daily.  .Marland KitchenoxyCODONE-acetaminophen (PERCOCET/ROXICET) 5-325 MG tablet Take 1 tablet by mouth every 4 (four) hours as needed for severe pain.  . potassium chloride SA (K-DUR,KLOR-CON) 20 MEQ tablet potassium chloride ER 20 mEq tablet,extended release(part/cryst)  . sertraline (ZOLOFT) 100 MG tablet Take 1 tablet (100  mg total) by mouth at bedtime.  Marland Kitchen terazosin (HYTRIN) 5 MG capsule Take 5 mg by mouth at bedtime.  . traMADol (ULTRAM) 50 MG tablet tramadol 50 mg tablet  take 1 tablet by mouth every 6 hours if needed for pain  . triamcinolone cream (KENALOG) 0.1 % Apply 1 application topically 2 (two) times daily as needed (itching).   No facility-administered encounter medications on file as of 06/20/2017.     Activities of Daily Living In your present state of health, do you have any difficulty performing the following activities: 06/20/2017 03/26/2017  Hearing? N N  Vision? N N  Difficulty concentrating or making decisions? N N  Walking or climbing stairs? Y Y   Dressing or bathing? N N  Doing errands, shopping? N N  Preparing Food and eating ? N -  Using the Toilet? N -  In the past six months, have you accidently leaked urine? Y -  Comment pads -  Do you have problems with loss of bowel control? N -  Managing your Medications? N -  Managing your Finances? N -  Housekeeping or managing your Housekeeping? N -  Some recent data might be hidden    Patient Care Team: Valerie Roys, DO as PCP - General (Family Medicine)    Assessment:     Exercise Activities and Dietary recommendations Current Exercise Habits: Home exercise routine (PT), Time (Minutes): 60, Frequency (Times/Week): 2, Weekly Exercise (Minutes/Week): 120, Intensity: Mild, Exercise limited by: None identified  Goals    None     Fall Risk Fall Risk  06/20/2017 06/11/2017 06/06/2017  Falls in the past year? No Yes Yes  Number falls in past yr: 2 or more 2 or more 2 or more  Injury with Fall? Yes Yes Yes  Risk Factor Category  High Fall Risk High Fall Risk -  Risk for fall due to : Impaired balance/gait;History of fall(s) Impaired balance/gait -  Follow up Falls prevention discussed Education provided;Falls prevention discussed -   Depression Screen PHQ 2/9 Scores 06/20/2017 06/11/2017 06/06/2017  PHQ - 2 Score '3 6 6  '$ PHQ- 9 Score '12 23 24  '$ Exception Documentation - Medical reason -     Cognitive Function     6CIT Screen 06/20/2017  What Year? 0 points  What month? 0 points  What time? 0 points  Count back from 20 0 points  Months in reverse 0 points  Repeat phrase 0 points  Total Score 0    Immunization History  Administered Date(s) Administered  . MMR 05/20/2000  . Pneumococcal Conjugate-13 03/29/2015  . Pneumococcal Polysaccharide-23 03/30/2016   Screening Tests Health Maintenance  Topic Date Due  . MAMMOGRAM  08/01/2017 (Originally 06/01/1998)  . DEXA SCAN  08/01/2017 (Originally 06/01/2013)  . COLONOSCOPY  08/01/2017 (Originally 06/01/1998)  . INFLUENZA  VACCINE  12/29/2017 (Originally 05/01/2017)  . TETANUS/TDAP  06/20/2018 (Originally 06/02/1967)  . Hepatitis C Screening  Completed  . PNA vac Low Risk Adult  Completed      Plan:     I have personally reviewed and addressed the Medicare Annual Wellness questionnaire and have noted the following in the patient's chart:  A. Medical and social history B. Use of alcohol, tobacco or illicit drugs  C. Current medications and supplements D. Functional ability and status E.  Nutritional status F.  Physical activity G. Advance directives H. List of other physicians I.  Hospitalizations, surgeries, and ER visits in previous 12 months J.  Vitals K. Screenings such  as hearing and vision if needed, cognitive and depression L. Referrals and appointments   In addition, I have reviewed and discussed with patient certain preventive protocols, quality metrics, and best practice recommendations. A written personalized care plan for preventive services as well as general preventive health recommendations were provided to patient.   Signed,  Tyler Aas, LPN Nurse Health Advisor   MD Recommendations: BP rechecked and discussed with Dr.Johnson before patient was sent home.

## 2017-06-21 ENCOUNTER — Other Ambulatory Visit: Payer: Self-pay | Admitting: Orthopedic Surgery

## 2017-06-21 DIAGNOSIS — M450 Ankylosing spondylitis of multiple sites in spine: Secondary | ICD-10-CM

## 2017-06-26 DIAGNOSIS — M545 Low back pain: Secondary | ICD-10-CM | POA: Diagnosis not present

## 2017-06-26 DIAGNOSIS — M25511 Pain in right shoulder: Secondary | ICD-10-CM | POA: Diagnosis not present

## 2017-06-26 DIAGNOSIS — M25611 Stiffness of right shoulder, not elsewhere classified: Secondary | ICD-10-CM | POA: Diagnosis not present

## 2017-06-27 DIAGNOSIS — M25511 Pain in right shoulder: Secondary | ICD-10-CM | POA: Diagnosis not present

## 2017-06-27 DIAGNOSIS — M75121 Complete rotator cuff tear or rupture of right shoulder, not specified as traumatic: Secondary | ICD-10-CM | POA: Diagnosis not present

## 2017-06-27 DIAGNOSIS — M7551 Bursitis of right shoulder: Secondary | ICD-10-CM | POA: Diagnosis not present

## 2017-06-28 ENCOUNTER — Ambulatory Visit
Admission: RE | Admit: 2017-06-28 | Discharge: 2017-06-28 | Disposition: A | Discharge status: Home / Self Care | Payer: Medicare Other | Source: Ambulatory Visit | Attending: Orthopedic Surgery | Admitting: Orthopedic Surgery

## 2017-06-28 DIAGNOSIS — M4802 Spinal stenosis, cervical region: Secondary | ICD-10-CM | POA: Insufficient documentation

## 2017-06-28 DIAGNOSIS — M48061 Spinal stenosis, lumbar region without neurogenic claudication: Secondary | ICD-10-CM | POA: Diagnosis not present

## 2017-06-28 DIAGNOSIS — M450 Ankylosing spondylitis of multiple sites in spine: Secondary | ICD-10-CM | POA: Insufficient documentation

## 2017-06-28 DIAGNOSIS — M2578 Osteophyte, vertebrae: Secondary | ICD-10-CM | POA: Diagnosis not present

## 2017-07-02 DIAGNOSIS — M25511 Pain in right shoulder: Secondary | ICD-10-CM | POA: Diagnosis not present

## 2017-07-02 DIAGNOSIS — M545 Low back pain: Secondary | ICD-10-CM | POA: Diagnosis not present

## 2017-07-05 ENCOUNTER — Ambulatory Visit
Admission: RE | Admit: 2017-07-05 | Discharge: 2017-07-05 | Disposition: A | Discharge status: Home / Self Care | Payer: Medicare Other | Source: Ambulatory Visit | Attending: Nurse Practitioner | Admitting: Nurse Practitioner

## 2017-07-05 ENCOUNTER — Encounter: Payer: Self-pay | Admitting: Family Medicine

## 2017-07-05 ENCOUNTER — Ambulatory Visit (INDEPENDENT_AMBULATORY_CARE_PROVIDER_SITE_OTHER): Payer: Medicare Other | Admitting: Family Medicine

## 2017-07-05 VITALS — BP 184/79 | HR 67 | Temp 98.9°F | Wt 162.5 lb

## 2017-07-05 DIAGNOSIS — M8588 Other specified disorders of bone density and structure, other site: Secondary | ICD-10-CM | POA: Diagnosis not present

## 2017-07-05 DIAGNOSIS — F339 Major depressive disorder, recurrent, unspecified: Secondary | ICD-10-CM

## 2017-07-05 DIAGNOSIS — G8929 Other chronic pain: Secondary | ICD-10-CM | POA: Diagnosis not present

## 2017-07-05 DIAGNOSIS — M533 Sacrococcygeal disorders, not elsewhere classified: Principal | ICD-10-CM

## 2017-07-05 DIAGNOSIS — M545 Low back pain: Secondary | ICD-10-CM | POA: Diagnosis not present

## 2017-07-05 DIAGNOSIS — I10 Essential (primary) hypertension: Secondary | ICD-10-CM

## 2017-07-05 DIAGNOSIS — M25511 Pain in right shoulder: Secondary | ICD-10-CM | POA: Diagnosis not present

## 2017-07-05 DIAGNOSIS — G894 Chronic pain syndrome: Secondary | ICD-10-CM | POA: Diagnosis not present

## 2017-07-05 MED ORDER — TRAZODONE HCL 50 MG PO TABS: 25.0000 mg | ORAL_TABLET | Freq: Every evening | ORAL | 3 refills | Status: DC | PRN

## 2017-07-05 MED ORDER — SERTRALINE HCL 100 MG PO TABS: 200.0000 mg | ORAL_TABLET | Freq: Every day | ORAL | 3 refills | Status: DC

## 2017-07-05 MED ORDER — OMEPRAZOLE 40 MG PO CPDR: 40.0000 mg | DELAYED_RELEASE_CAPSULE | Freq: Every day | ORAL | 1 refills | Status: DC

## 2017-07-05 MED ORDER — HYDRALAZINE HCL 25 MG PO TABS: 25.0000 mg | ORAL_TABLET | Freq: Three times a day (TID) | ORAL | 3 refills | Status: DC

## 2017-07-05 NOTE — Patient Instructions (Addendum)
Go to [facility] do not need an appointment- just walk in.  [address] [place], [place], [address] [phone]  Go to [facility] at the hospital Get your x-ray  Then you can see pain management to discuss medicine/treatment [place] Address: [address] [place], [place], [place] [address]  Phone: [phone]

## 2017-07-05 NOTE — Assessment & Plan Note (Signed)
Not under good control. Will keep metoprolol, clonidine and amlodipine. Will add hydralazine. Recheck 2 weeks.

## 2017-07-05 NOTE — Progress Notes (Signed)
BP (!) 184/79 (BP Location: Left Arm, Patient Position: Sitting, Cuff Size: Normal)   Pulse 67   Temp 98.9 F (37.2 C)   Wt 162 lb 8 oz (73.7 kg)   SpO2 97%   BMI 27.04 kg/m    Subjective:    Patient ID: Gloria Allen, female    DOB: 1946-02-21, 71 y.o.   MRN: [id number]  HPI: JAYDY FITZHENRY is a 71 y.o. female  Chief Complaint  Patient presents with  . Hypertension  . Depression   HYPERTENSION Hypertension status: uncontrolled  Satisfied with current treatment? yes Duration of hypertension: chronic BP monitoring frequency:  not checking BP range:  BP medication side effects:  no Medication compliance: good compliance Previous BP meds: metoprolol, amlodipine, clonidine Aspirin: no Recurrent headaches: no Visual changes: no Palpitations: no Dyspnea: no Chest pain: no Lower extremity edema: no Dizzy/lightheaded: no  ANXIETY/STRESS Duration:better Anxious mood: yes  Excessive worrying: yes Irritability: yes  Sweating: no Nausea: no Palpitations:no Hyperventilation: no Panic attacks: no Agoraphobia: no  Obscessions/compulsions: no Depressed mood: yes Depression screen [facility] 2/9 07/05/2017 06/20/2017 06/11/2017 06/06/2017  Decreased Interest 0 0 3 3  Down, Depressed, Hopeless 1 3 3 3   PHQ - 2 Score 1 3 6 6   Altered sleeping 2 2 3 3   Tired, decreased energy 3 3 3 3   Change in appetite 2 3 3 3   Feeling bad or failure about yourself  0 1 3 3   Trouble concentrating 0 0 3 3  Moving slowly or fidgety/restless 1 0 2 3  Suicidal thoughts 0 0 0 0  PHQ-9 Score 9 12 23 24   Difficult doing work/chores Somewhat difficult Somewhat difficult Not difficult at all -   Anhedonia: no Weight changes: no Insomnia: yes hard to fall asleep  Hypersomnia: no Fatigue/loss of energy: yes Feelings of worthlessness: yes Feelings of guilt: yes Impaired concentration/indecisiveness: yes Suicidal ideations: no  Crying spells: yes Recent Stressors/Life Changes: yes  Still in a lot of  pain. Hasn't been back to the pain clinic yet. Waiting to hear from them. Saw them 1x, has not gone to see the psychologist. Has not had her x-ray done. Otherwise doing OK. Very sore today from PT.  Relevant past medical, surgical, family and social history reviewed and updated as indicated. Interim medical history since our last visit reviewed. Allergies and medications reviewed and updated.  Review of Systems  Constitutional: Negative.   Respiratory: Negative.   Cardiovascular: Negative.   Musculoskeletal: Positive for arthralgias, back pain and myalgias. Negative for gait problem, joint swelling, neck pain and neck stiffness.  Neurological: Negative.   Psychiatric/Behavioral: Negative.     Per HPI unless specifically indicated above     Objective:    BP (!) 184/79 (BP Location: Left Arm, Patient Position: Sitting, Cuff Size: Normal)   Pulse 67   Temp 98.9 F (37.2 C)   Wt 162 lb 8 oz (73.7 kg)   SpO2 97%   BMI 27.04 kg/m   Wt Readings from Last 3 Encounters:  07/05/17 162 lb 8 oz (73.7 kg)  06/20/17 164 lb 9.6 oz (74.7 kg)  06/20/17 164 lb 14.4 oz (74.8 kg)    Physical Exam  Constitutional: She is oriented to person, place, and time. She appears well-developed and well-nourished. No distress.  HENT:  Head: Normocephalic and atraumatic.  Right Ear: Hearing normal.  Left Ear: Hearing normal.  Nose: Nose normal.  Eyes: Conjunctivae and lids are normal. Right eye exhibits no discharge. Left eye  exhibits no discharge. No scleral icterus.  Cardiovascular: Normal rate, regular rhythm, normal heart sounds and intact distal pulses.  Exam reveals no gallop and no friction rub.   No murmur heard. Pulmonary/Chest: Effort normal and breath sounds normal. No respiratory distress. She has no wheezes. She has no rales. She exhibits no tenderness.  Musculoskeletal: Normal range of motion.  Neurological: She is alert and oriented to person, place, and time.  Skin: Skin is warm, dry and  intact. No rash noted. She is not diaphoretic. No erythema. No pallor.  Psychiatric: She has a normal mood and affect. Her speech is normal and behavior is normal. Judgment and thought content normal. Cognition and memory are normal.  Nursing note and vitals reviewed.   Results for orders placed or performed in visit on 06/11/17  Vitamin B12  Result Value Ref Range   Vitamin B-12 751 232 - 1,245 pg/mL      Assessment & Plan:   Problem List Items Addressed This Visit      Cardiovascular and Mediastinum   Hypertension - Primary    Not under good control. Will keep metoprolol, clonidine and amlodipine. Will add hydralazine. Recheck 2 weeks.       Relevant Medications   hydrALAZINE (APRESOLINE) 25 MG tablet     Other   Depression    Slightly better on PHQ9s, will increase zoloft to 200mg  daily and recheck 1 month.       Relevant Medications   sertraline (ZOLOFT) 100 MG tablet   traZODone (DESYREL) 50 MG tablet   Chronic pain syndrome    Information about the pain psychologist given today. Needs to see them and get x-ray then can get back into pain clinic. Continue to follow with orthopedics for her shoulder. Continue PT. Call with any concerns.           Follow up plan: Return in about 2 weeks (around 07/19/2017) for follow up blood pressure.

## 2017-07-05 NOTE — Assessment & Plan Note (Signed)
Information about the pain psychologist given today. Needs to see them and get x-ray then can get back into pain clinic. Continue to follow with orthopedics for her shoulder. Continue PT. Call with any concerns.

## 2017-07-05 NOTE — Assessment & Plan Note (Signed)
Slightly better on PHQ9s, will increase zoloft to 200mg  daily and recheck 1 month.

## 2017-07-08 NOTE — Progress Notes (Signed)
Results were reviewed and found to be: Osteopenia  No acute injury or pathology identified

## 2017-07-09 ENCOUNTER — Telehealth: Payer: Self-pay | Admitting: Family Medicine

## 2017-07-10 ENCOUNTER — Ambulatory Visit: Payer: Medicare Other | Admitting: Licensed Clinical Social Worker

## 2017-07-10 NOTE — Telephone Encounter (Signed)
ERROR

## 2017-07-12 ENCOUNTER — Ambulatory Visit (INDEPENDENT_AMBULATORY_CARE_PROVIDER_SITE_OTHER): Payer: Medicare Other | Admitting: Licensed Clinical Social Worker

## 2017-07-12 DIAGNOSIS — F331 Major depressive disorder, recurrent, moderate: Secondary | ICD-10-CM | POA: Diagnosis not present
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
## 2017-07-17 ENCOUNTER — Telehealth: Payer: Self-pay | Admitting: Family Medicine

## 2017-07-17 NOTE — Telephone Encounter (Signed)
[facility] requesting a fax back or verbal orders to refill prescription of patient's medication for omega 3; lidocaine ointment; and mometasone nasal spray.  Please Advise.  Thank you

## 2017-07-18 NOTE — Telephone Encounter (Signed)
Tried to call patient to make sure she requested these, no answer, will try again.

## 2017-07-18 NOTE — Telephone Encounter (Signed)
Dr.Johnson, is these medications something the patient would benefit from? If so I will call and get them to fax another copy to [place].

## 2017-07-18 NOTE — Telephone Encounter (Signed)
If she requested this, OK to give verbal/get new fax

## 2017-07-19 ENCOUNTER — Encounter: Payer: Self-pay | Admitting: Family Medicine

## 2017-07-19 ENCOUNTER — Ambulatory Visit (INDEPENDENT_AMBULATORY_CARE_PROVIDER_SITE_OTHER): Payer: Medicare Other | Admitting: Family Medicine

## 2017-07-19 VITALS — BP 162/70 | HR 60 | Temp 98.5°F | Wt 166.4 lb

## 2017-07-19 DIAGNOSIS — I10 Essential (primary) hypertension: Secondary | ICD-10-CM

## 2017-07-19 MED ORDER — HYDRALAZINE HCL 50 MG PO TABS: 50.0000 mg | ORAL_TABLET | Freq: Three times a day (TID) | ORAL | 3 refills | Status: DC

## 2017-07-19 NOTE — Telephone Encounter (Signed)
Patient seen in for office visit 07/19/2017.

## 2017-07-19 NOTE — Telephone Encounter (Signed)
Will wait to see if we receive another fax since we are unable to reach the patient.

## 2017-07-19 NOTE — Patient Instructions (Signed)
Take 2 of your hydralazine (50mg  3x a day) and I'll see you in 2 weeks to check your blood pressure and your mood  Call the Pain clinic for an appointment.

## 2017-07-19 NOTE — Assessment & Plan Note (Signed)
Not under good control, better on recheck. Will increase hydralazine to 50mg  TID and recheck in 2 weeks. If still not doing better, may need to see nephrology. Continue metoprolol, amlodipine and clonidine. Angioedema to ace inhibitors.

## 2017-07-19 NOTE — Telephone Encounter (Signed)
Tried to call patient, no answer,unable to leave message will try again.

## 2017-07-19 NOTE — Progress Notes (Signed)
   BP (!) 162/70 (BP Location: Left Arm, Cuff Size: Normal)   Pulse 60   Temp 98.5 F (36.9 C)   Wt 166 lb 6 oz (75.5 kg)   SpO2 98%   BMI 27.69 kg/m    Subjective:    Patient ID: Gloria Allen, female    DOB: 1946/05/16, 71 y.o.   MRN: [id number]  HPI: Gloria Allen is a 71 y.o. female  Chief Complaint  Patient presents with  . Hypertension   HYPERTENSION Hypertension status: uncontrolled  Satisfied with current treatment? no Duration of hypertension: chronic BP monitoring frequency:  not checking BP medication side effects:  no Medication compliance: fair compliance Previous BP meds: metoprolol, hydralazine, amlodipine, clonidine Aspirin: no Recurrent headaches: yes Visual changes: no Palpitations: no Dyspnea: yes Chest pain: no Lower extremity edema: no Dizzy/lightheaded: no   Relevant past medical, surgical, family and social history reviewed and updated as indicated. Interim medical history since our last visit reviewed. Allergies and medications reviewed and updated.  Review of Systems  Constitutional: Negative.   Respiratory: Negative.   Cardiovascular: Negative.   Psychiatric/Behavioral: Negative.     Per HPI unless specifically indicated above     Objective:    BP (!) 162/70 (BP Location: Left Arm, Cuff Size: Normal)   Pulse 60   Temp 98.5 F (36.9 C)   Wt 166 lb 6 oz (75.5 kg)   SpO2 98%   BMI 27.69 kg/m   Wt Readings from Last 3 Encounters:  07/19/17 166 lb 6 oz (75.5 kg)  07/05/17 162 lb 8 oz (73.7 kg)  06/20/17 164 lb 9.6 oz (74.7 kg)    Physical Exam  Constitutional: She is oriented to person, place, and time. She appears well-developed and well-nourished. No distress.  HENT:  Head: Normocephalic and atraumatic.  Right Ear: Hearing normal.  Left Ear: Hearing normal.  Nose: Nose normal.  Eyes: Conjunctivae and lids are normal. Right eye exhibits no discharge. Left eye exhibits no discharge. No scleral icterus.  Cardiovascular:  Normal rate, regular rhythm, normal heart sounds and intact distal pulses.  Exam reveals no gallop and no friction rub.   No murmur heard. Pulmonary/Chest: Effort normal and breath sounds normal. No respiratory distress. She has no wheezes. She has no rales. She exhibits no tenderness.  Musculoskeletal: Normal range of motion.  Neurological: She is alert and oriented to person, place, and time.  Skin: Skin is dry and intact. No rash noted. She is not diaphoretic. No erythema. No pallor.  Psychiatric: She has a normal mood and affect. Her speech is normal and behavior is normal. Judgment and thought content normal. Cognition and memory are normal.    Results for orders placed or performed in visit on 06/11/17  Vitamin B12  Result Value Ref Range   Vitamin B-12 751 232 - 1,245 pg/mL      Assessment & Plan:   Problem List Items Addressed This Visit      Cardiovascular and Mediastinum   Hypertension - Primary    Not under good control, better on recheck. Will increase hydralazine to 50mg  TID and recheck in 2 weeks. If still not doing better, may need to see nephrology. Continue metoprolol, amlodipine and clonidine. Angioedema to ace inhibitors.       Relevant Orders   Basic metabolic panel       Follow up plan: Return 2 weeks, for BP and mood.

## 2017-07-20 LAB — BASIC METABOLIC PANEL
BUN/Creatinine Ratio: 23 (ref 12–28)
BUN: 16 mg/dL (ref 8–27)
CALCIUM: 9.1 mg/dL (ref 8.7–10.3)
CO2: 24 mmol/L (ref 20–29)
Chloride: 106 mmol/L (ref 96–106)
Creatinine, Ser: 0.7 mg/dL (ref 0.57–1.00)
GFR calc Af Amer: 102 mL/min/{1.73_m2} (ref >59)
GFR, EST NON AFRICAN AMERICAN: 89 mL/min/{1.73_m2} (ref >59)
GLUCOSE: 95 mg/dL (ref 65–99)
Potassium: 4 mmol/L (ref 3.5–5.2)
SODIUM: 146 mmol/L — AB (ref 134–144)

## 2017-07-22 ENCOUNTER — Encounter: Payer: Self-pay | Admitting: Family Medicine

## 2017-07-24 DIAGNOSIS — M25511 Pain in right shoulder: Secondary | ICD-10-CM | POA: Diagnosis not present

## 2017-07-25 DIAGNOSIS — M5030 Other cervical disc degeneration, unspecified cervical region: Secondary | ICD-10-CM | POA: Diagnosis not present

## 2017-07-30 ENCOUNTER — Telehealth: Payer: Self-pay | Admitting: Family Medicine

## 2017-07-30 DIAGNOSIS — M5136 Other intervertebral disc degeneration, lumbar region: Secondary | ICD-10-CM

## 2017-07-30 DIAGNOSIS — M4310 Spondylolisthesis, site unspecified: Secondary | ICD-10-CM

## 2017-07-30 DIAGNOSIS — Z789 Other specified health status: Secondary | ICD-10-CM | POA: Insufficient documentation

## 2017-07-30 DIAGNOSIS — M51369 Other intervertebral disc degeneration, lumbar region without mention of lumbar back pain or lower extremity pain: Secondary | ICD-10-CM

## 2017-07-30 DIAGNOSIS — M48062 Spinal stenosis, lumbar region with neurogenic claudication: Secondary | ICD-10-CM

## 2017-07-30 DIAGNOSIS — M4712 Other spondylosis with myelopathy, cervical region: Secondary | ICD-10-CM | POA: Insufficient documentation

## 2017-07-30 DIAGNOSIS — M4802 Spinal stenosis, cervical region: Secondary | ICD-10-CM | POA: Insufficient documentation

## 2017-07-30 DIAGNOSIS — G9589 Other specified diseases of spinal cord: Secondary | ICD-10-CM | POA: Insufficient documentation

## 2017-07-30 DIAGNOSIS — M4722 Other spondylosis with radiculopathy, cervical region: Secondary | ICD-10-CM

## 2017-07-30 DIAGNOSIS — M5126 Other intervertebral disc displacement, lumbar region: Secondary | ICD-10-CM

## 2017-07-30 DIAGNOSIS — M8588 Other specified disorders of bone density and structure, other site: Secondary | ICD-10-CM | POA: Insufficient documentation

## 2017-07-30 DIAGNOSIS — M5030 Other cervical disc degeneration, unspecified cervical region: Secondary | ICD-10-CM | POA: Insufficient documentation

## 2017-07-30 DIAGNOSIS — Z79899 Other long term (current) drug therapy: Secondary | ICD-10-CM | POA: Insufficient documentation

## 2017-07-30 DIAGNOSIS — M48061 Spinal stenosis, lumbar region without neurogenic claudication: Secondary | ICD-10-CM | POA: Insufficient documentation

## 2017-07-30 DIAGNOSIS — M4810 Ankylosing hyperostosis [Forestier], site unspecified: Secondary | ICD-10-CM | POA: Insufficient documentation

## 2017-07-30 DIAGNOSIS — M47816 Spondylosis without myelopathy or radiculopathy, lumbar region: Secondary | ICD-10-CM | POA: Insufficient documentation

## 2017-07-30 DIAGNOSIS — M899 Disorder of bone, unspecified: Secondary | ICD-10-CM | POA: Insufficient documentation

## 2017-07-30 HISTORY — DX: Spinal stenosis, lumbar region with neurogenic claudication: M48.062

## 2017-07-30 HISTORY — DX: Spondylolisthesis, site unspecified: M43.10

## 2017-07-30 HISTORY — DX: Other intervertebral disc degeneration, lumbar region without mention of lumbar back pain or lower extremity pain: M51.369

## 2017-07-30 HISTORY — DX: Other intervertebral disc degeneration, lumbar region: M51.36

## 2017-07-30 HISTORY — DX: Other intervertebral disc displacement, lumbar region: M51.26

## 2017-07-30 HISTORY — DX: Other cervical disc degeneration, unspecified cervical region: M50.30

## 2017-07-30 HISTORY — DX: Spinal stenosis, cervical region: M48.02

## 2017-07-30 HISTORY — DX: Ankylosing hyperostosis (forestier), site unspecified: M48.10

## 2017-07-30 HISTORY — DX: Spondylosis without myelopathy or radiculopathy, lumbar region: M47.816
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
## 2017-07-30 NOTE — Telephone Encounter (Signed)
She needs to see the allergist. There isn't anything I can do about mold. She missed her appointment last time with the allergist and I'm pretty sure we got her another one scheduled.

## 2017-07-30 NOTE — Telephone Encounter (Signed)
Copied from [place] #[id number]. Topic: General - Other >> Jul 29, 2017  4:23 PM Patrice Paradise wrote: Reason for CRM: Pt would like for the Dr. Wynetta Emery to give her a call. Pt stated that she spoke to her the other day about having mold and it's causing her hurt.

## 2017-07-30 NOTE — Telephone Encounter (Signed)
Patient notified, allergist appointment tomorrow.

## 2017-07-31 ENCOUNTER — Encounter: Payer: Self-pay | Admitting: Pain Medicine

## 2017-07-31 ENCOUNTER — Ambulatory Visit: Payer: Medicare Other | Attending: Pain Medicine | Admitting: Pain Medicine

## 2017-07-31 VITALS — BP 188/64 | HR 56 | Temp 98.6°F | Resp 16 | Ht 65.0 in | Wt 160.0 lb

## 2017-07-31 DIAGNOSIS — M5137 Other intervertebral disc degeneration, lumbosacral region: Secondary | ICD-10-CM | POA: Insufficient documentation

## 2017-07-31 DIAGNOSIS — M5136 Other intervertebral disc degeneration, lumbar region: Secondary | ICD-10-CM

## 2017-07-31 DIAGNOSIS — M9981 Other biomechanical lesions of cervical region: Secondary | ICD-10-CM | POA: Diagnosis not present

## 2017-07-31 DIAGNOSIS — M75121 Complete rotator cuff tear or rupture of right shoulder, not specified as traumatic: Secondary | ICD-10-CM

## 2017-07-31 DIAGNOSIS — M19011 Primary osteoarthritis, right shoulder: Secondary | ICD-10-CM

## 2017-07-31 DIAGNOSIS — M4810 Ankylosing hyperostosis [Forestier], site unspecified: Secondary | ICD-10-CM

## 2017-07-31 DIAGNOSIS — Z789 Other specified health status: Secondary | ICD-10-CM

## 2017-07-31 DIAGNOSIS — G9589 Other specified diseases of spinal cord: Secondary | ICD-10-CM

## 2017-07-31 DIAGNOSIS — M47816 Spondylosis without myelopathy or radiculopathy, lumbar region: Secondary | ICD-10-CM

## 2017-07-31 DIAGNOSIS — M4316 Spondylolisthesis, lumbar region: Secondary | ICD-10-CM | POA: Diagnosis not present

## 2017-07-31 DIAGNOSIS — M4712 Other spondylosis with myelopathy, cervical region: Secondary | ICD-10-CM | POA: Diagnosis not present

## 2017-07-31 DIAGNOSIS — Z79891 Long term (current) use of opiate analgesic: Secondary | ICD-10-CM | POA: Diagnosis not present

## 2017-07-31 DIAGNOSIS — M79605 Pain in left leg: Secondary | ICD-10-CM

## 2017-07-31 DIAGNOSIS — M25511 Pain in right shoulder: Secondary | ICD-10-CM | POA: Diagnosis not present

## 2017-07-31 DIAGNOSIS — M4722 Other spondylosis with radiculopathy, cervical region: Secondary | ICD-10-CM | POA: Diagnosis not present

## 2017-07-31 DIAGNOSIS — M9983 Other biomechanical lesions of lumbar region: Secondary | ICD-10-CM

## 2017-07-31 DIAGNOSIS — M5126 Other intervertebral disc displacement, lumbar region: Secondary | ICD-10-CM

## 2017-07-31 DIAGNOSIS — M5106 Intervertebral disc disorders with myelopathy, lumbar region: Secondary | ICD-10-CM | POA: Diagnosis not present

## 2017-07-31 DIAGNOSIS — M48062 Spinal stenosis, lumbar region with neurogenic claudication: Secondary | ICD-10-CM

## 2017-07-31 DIAGNOSIS — M533 Sacrococcygeal disorders, not elsewhere classified: Secondary | ICD-10-CM

## 2017-07-31 DIAGNOSIS — M542 Cervicalgia: Secondary | ICD-10-CM | POA: Diagnosis not present

## 2017-07-31 DIAGNOSIS — M4310 Spondylolisthesis, site unspecified: Secondary | ICD-10-CM

## 2017-07-31 DIAGNOSIS — M48061 Spinal stenosis, lumbar region without neurogenic claudication: Secondary | ICD-10-CM

## 2017-07-31 DIAGNOSIS — G589 Mononeuropathy, unspecified: Secondary | ICD-10-CM | POA: Diagnosis not present

## 2017-07-31 DIAGNOSIS — M4802 Spinal stenosis, cervical region: Secondary | ICD-10-CM

## 2017-07-31 DIAGNOSIS — M5441 Lumbago with sciatica, right side: Secondary | ICD-10-CM

## 2017-07-31 DIAGNOSIS — M4807 Spinal stenosis, lumbosacral region: Secondary | ICD-10-CM | POA: Diagnosis not present

## 2017-07-31 DIAGNOSIS — M5442 Lumbago with sciatica, left side: Secondary | ICD-10-CM | POA: Diagnosis not present

## 2017-07-31 DIAGNOSIS — M8588 Other specified disorders of bone density and structure, other site: Secondary | ICD-10-CM

## 2017-07-31 DIAGNOSIS — G894 Chronic pain syndrome: Secondary | ICD-10-CM | POA: Diagnosis not present

## 2017-07-31 DIAGNOSIS — Z5181 Encounter for therapeutic drug level monitoring: Secondary | ICD-10-CM | POA: Diagnosis not present

## 2017-07-31 DIAGNOSIS — M899 Disorder of bone, unspecified: Secondary | ICD-10-CM

## 2017-07-31 DIAGNOSIS — G8929 Other chronic pain: Secondary | ICD-10-CM

## 2017-07-31 DIAGNOSIS — Z79899 Other long term (current) drug therapy: Secondary | ICD-10-CM

## 2017-07-31 DIAGNOSIS — F1190 Opioid use, unspecified, uncomplicated: Secondary | ICD-10-CM

## 2017-07-31 DIAGNOSIS — M79604 Pain in right leg: Secondary | ICD-10-CM | POA: Diagnosis not present

## 2017-07-31 DIAGNOSIS — M51369 Other intervertebral disc degeneration, lumbar region without mention of lumbar back pain or lower extremity pain: Secondary | ICD-10-CM

## 2017-07-31 NOTE — Patient Instructions (Signed)

## 2017-07-31 NOTE — Progress Notes (Signed)
Nursing Pain Medication Assessment:  Safety precautions to be maintained throughout the outpatient stay will include: orient to surroundings, keep bed in low position, maintain call bell within reach at all times, provide assistance with transfer out of bed and ambulation.FYI: gets tramadol from Dr Brynda Greathouse  Medication Inspection Compliance: Pill count conducted under aseptic conditions, in front of the patient. Neither the pills nor the bottle was removed from the patient's sight at any time. Once count was completed pills were immediately returned to the patient in their original bottle.  Medication: Tramadol (Ultram) Pill/Patch Count: 86 of 124 pills remain Pill/Patch Appearance: Markings consistent with prescribed medication Bottle Appearance: Standard pharmacy container. Clearly labeled. Filled Date: 10/13 / 2018 Last Medication intake:  Yesterday

## 2017-08-02 ENCOUNTER — Telehealth: Payer: Self-pay | Admitting: Family Medicine

## 2017-08-02 ENCOUNTER — Telehealth: Payer: Self-pay

## 2017-08-02 NOTE — Telephone Encounter (Signed)
Pt asking if prescription was received for pain medication faxed by the pain clinic provider. Santiago Glad, Animal nutritionist at [employer] contacted and conference cal initiated with the pt so that concerns could be addressed.

## 2017-08-02 NOTE — Telephone Encounter (Signed)
Patient called and left a message requesting to speak to [place] regarding pain medication.

## 2017-08-02 NOTE — Telephone Encounter (Signed)
Called patient, no answer, unable to leave a message.

## 2017-08-02 NOTE — Telephone Encounter (Signed)
Call transferred from [facility] and appt was scheduled per Dr Wynetta Emery.

## 2017-08-02 NOTE — Telephone Encounter (Signed)
Copied from [facility] #[id number]. Topic: Quick Communication - See Telephone Encounter >> Aug 02, 2017  4:04 PM Cleaster Corin, NT wrote: CRM for notification. See Telephone encounter for:  08/02/17. calling back about pain med. Would like to talk with a nurse

## 2017-08-02 NOTE — Telephone Encounter (Signed)
If patient calls back- will need an appointment.

## 2017-08-05 ENCOUNTER — Ambulatory Visit (INDEPENDENT_AMBULATORY_CARE_PROVIDER_SITE_OTHER): Payer: Medicare Other | Admitting: Family Medicine

## 2017-08-05 ENCOUNTER — Encounter: Payer: Self-pay | Admitting: Family Medicine

## 2017-08-05 VITALS — BP 126/50 | HR 85 | Wt 167.0 lb

## 2017-08-05 DIAGNOSIS — F339 Major depressive disorder, recurrent, unspecified: Secondary | ICD-10-CM | POA: Diagnosis not present

## 2017-08-05 DIAGNOSIS — Z789 Other specified health status: Secondary | ICD-10-CM | POA: Diagnosis not present

## 2017-08-05 DIAGNOSIS — I10 Essential (primary) hypertension: Secondary | ICD-10-CM

## 2017-08-05 DIAGNOSIS — G894 Chronic pain syndrome: Secondary | ICD-10-CM | POA: Diagnosis not present

## 2017-08-05 DIAGNOSIS — Z77120 Contact with and (suspected) exposure to mold (toxic): Secondary | ICD-10-CM | POA: Insufficient documentation

## 2017-08-05 DIAGNOSIS — R300 Dysuria: Secondary | ICD-10-CM | POA: Diagnosis not present

## 2017-08-05 DIAGNOSIS — F419 Anxiety disorder, unspecified: Secondary | ICD-10-CM | POA: Diagnosis not present

## 2017-08-05 LAB — UA/M W/RFLX CULTURE, ROUTINE
BILIRUBIN UA: NEGATIVE
Glucose, UA: NEGATIVE
Ketones, UA: NEGATIVE
Leukocytes, UA: NEGATIVE
Nitrite, UA: NEGATIVE
PH UA: 5.5 (ref 5.0–7.5)
Protein, UA: NEGATIVE
RBC UA: NEGATIVE
SPEC GRAV UA: 1.015 (ref 1.005–1.030)
UUROB: 0.2 mg/dL (ref 0.2–1.0)

## 2017-08-05 MED ORDER — ARIPIPRAZOLE 5 MG PO TABS: 5.0000 mg | ORAL_TABLET | Freq: Every day | ORAL | 3 refills | Status: DC

## 2017-08-05 NOTE — Assessment & Plan Note (Addendum)
Still very concerned about the prospect of mold in her home. Advised her again to look for other options in terms of living situations and to speak to her land lord. Reminded patient of her appointment with the allergist on 11/12. Will get home health to send social worker out to see if there is anything they can do. Referral to [facility] also placed to help with her living situation.

## 2017-08-05 NOTE — Progress Notes (Signed)
BP (!) 126/50 (BP Location: Left Arm, Cuff Size: Normal)   Pulse 85   Wt 167 lb (75.8 kg)   SpO2 99%   BMI 27.79 kg/m    Subjective:    Patient ID: Gloria Allen, female    DOB: [birth year], 71 y.o.   MRN: [id number]  HPI: Gloria Allen is a 71 y.o. female history limited today as patient is a very poor historian.   Chief Complaint  Patient presents with  . Follow-up  . Hypertension    Still high.   . Mood   Still very concerned about the prospect of mold in her home. Advised her again to look for other options in terms of living situations and to speak to her land lord. Reminded patient of her appointment with the allergist on 11/12. She is upset, states that no one is helping her with the mold. That it is making her feel sick. She thinks it is also making her urine burn when she pees. This has been going on for months.   HYPERTENSION Hypertension status: better  Satisfied with current treatment? no Duration of hypertension: chronic BP monitoring frequency:  not checking BP medication side effects:  no Medication compliance: good compliance Previous BP meds: metoprolol, hydralazine, amlodipine, clonidine Aspirin: no Recurrent headaches: yes Visual changes: no Palpitations: no Dyspnea: yes Chest pain: no Lower extremity edema: no Dizzy/lightheaded: no  DEPRESSION Mood status: worse Satisfied with current treatment?: yes Symptom severity: moderate  Duration of current treatment : months Side effects: no Medication compliance: fair compliance Psychotherapy/counseling: no  Previous psychiatric medications: sertraline, klonopin Depressed mood: yes Anxious mood: yes Anhedonia: no Significant weight loss or gain: no Insomnia: no  Fatigue: yes Feelings of worthlessness or guilt: yes Impaired concentration/indecisiveness: yes Suicidal ideations: no Hopelessness: no Crying spells: yes Depression screen [facility] 2/9 08/05/2017 07/31/2017 07/05/2017 06/20/2017 06/11/2017    Decreased Interest 0 0 0 0 3  Down, Depressed, Hopeless 2 0 1 3 3   PHQ - 2 Score 2 0 1 3 6   Altered sleeping 3 - 2 2 3   Tired, decreased energy 3 - 3 3 3   Change in appetite 2 - 2 3 3   Feeling bad or failure about yourself  0 - 0 1 3  Trouble concentrating 3 - 0 0 3  Moving slowly or fidgety/restless 2 - 1 0 2  Suicidal thoughts 0 - 0 0 0  PHQ-9 Score 15 - 9 12 23   Difficult doing work/chores - - Somewhat difficult Somewhat difficult Not difficult at all   GAD 7 : Generalized Anxiety Score 08/05/2017  Nervous, Anxious, on Edge 3  Control/stop worrying 2  Worry too much - different things 3  Trouble relaxing 2  Restless 2  Easily annoyed or irritable 1  Afraid - awful might happen 3  Total GAD 7 Score 16  Anxiety Difficulty Somewhat difficult    CHRONIC PAIN- patient does not want any interventional pain management. Pain management declined to take over her opiate pain management. They advised her that she should be off all benzodiazepines prior to taking any opiates  Present dose:  Morphine equivalents Pain control status: uncontrolled Duration: chronic Location: wide spread- especially back, neck and shoulder Quality: throbbing, aching Current Pain Level: 8/10 Previous Pain Level: 8/10 Breakthrough pain: yes Benefit from narcotic medications: yes What Activities task can be accomplished with current medication? Able to get around Interested in weaning off narcotics:no   Stool softners/OTC fiber: no  Previous pain specialty evaluation:  yes Non-narcotic analgesic meds: no Narcotic contract: no   Relevant past medical, surgical, family and social history reviewed and updated as indicated. Interim medical history since our last visit reviewed. Allergies and medications reviewed and updated.  Review of Systems  Constitutional: Negative.   Respiratory: Negative.   Cardiovascular: Negative.   Musculoskeletal: Positive for arthralgias, back pain, myalgias and neck pain.  Negative for gait problem and joint swelling.  Neurological: Negative.   Psychiatric/Behavioral: Positive for dysphoric mood. Negative for agitation, behavioral problems, confusion, decreased concentration, hallucinations and self-injury. The patient is nervous/anxious. The patient is not hyperactive.     Per HPI unless specifically indicated above     Objective:    BP (!) 126/50 (BP Location: Left Arm, Cuff Size: Normal)   Pulse 85   Wt 167 lb (75.8 kg)   SpO2 99%   BMI 27.79 kg/m   Wt Readings from Last 3 Encounters:  08/05/17 167 lb (75.8 kg)  07/31/17 160 lb (72.6 kg)  07/19/17 166 lb 6 oz (75.5 kg)    Physical Exam  Constitutional: She is oriented to person, place, and time. She appears well-developed and well-nourished. No distress.  HENT:  Head: Normocephalic and atraumatic.  Right Ear: Hearing normal.  Left Ear: Hearing normal.  Nose: Nose normal.  Eyes: Conjunctivae and lids are normal. Right eye exhibits no discharge. Left eye exhibits no discharge. No scleral icterus.  Cardiovascular: Normal rate, regular rhythm, normal heart sounds and intact distal pulses. Exam reveals no gallop and no friction rub.  No murmur heard. Pulmonary/Chest: Effort normal and breath sounds normal. No respiratory distress. She has no wheezes. She has no rales. She exhibits no tenderness.  Musculoskeletal: Normal range of motion.  Neurological: She is alert and oriented to person, place, and time.  Skin: Skin is warm, dry and intact. No rash noted. She is not diaphoretic. No erythema. No pallor.  Psychiatric: She has a normal mood and affect. Her speech is normal and behavior is normal. Judgment and thought content normal. Cognition and memory are normal.  Nursing note and vitals reviewed.   Results for orders placed or performed in visit on 85/63/14  Basic metabolic panel  Result Value Ref Range   Glucose 95 65 - 99 mg/dL   BUN 16 8 - 27 mg/dL   Creatinine, Ser 0.70 0.57 - 1.00 mg/dL     GFR calc non Af Amer 89 >59 mL/min/1.73   GFR calc Af Amer 102 >59 mL/min/1.73   BUN/Creatinine Ratio 23 12 - 28   Sodium 146 (H) 134 - 144 mmol/L   Potassium 4.0 3.5 - 5.2 mmol/L   Chloride 106 96 - 106 mmol/L   CO2 24 20 - 29 mmol/L   Calcium 9.1 8.7 - 10.3 mg/dL      Assessment & Plan:   Problem List Items Addressed This Visit      Cardiovascular and Mediastinum   Hypertension    Better on recheck. Unsure what medications she is actually taking- clonidine not in the bag of medicine she brought today. Will get home health in to check on what medications she is actually taking. Await their input.         Other   Chronic pain syndrome - Primary (Chronic)    PMP reviewed. Several doctors prescribing pain medication. Has been getting her tramadol from her former PCP with refills left over. Has been getting oxycodone from her orthopedist. Had been getting clonazepam from her former PCP. No Rx on file  for oxazepam. Patient violated her last controlled substance agreement with her PCP, which is why she can't get her Rxs from them and switched here. Informed patient again that we do not do long term pain management in this office and will refer her to a different pain management doctor. She does not want to see another pain management doctor. Advised her that her other option, should she decide to continue her pain management here, would be to wean off her tramadol. She is aware. Patient left the office prior to finishing this conversation.       Anxiety    Not under good control. Patient left the office prior to discussing her depression. Patient is at max dose of her sertraline. Not comfortable giving her wellbutrin with her labile blood pressure. Called patient after she left as she may be a good candidate for abilify. She agreed to try it. Follow up 1 month.       Depression    Not under good control. Patient left the office prior to discussing her depression. Patient is at max dose of  her sertraline. Not comfortable giving her wellbutrin with her labile blood pressure. Called patient after she left as she may be a good candidate for abilify. She agreed to try it. Follow up 1 month.       Suspected exposure to mold    Still very concerned about the prospect of mold in her home. Advised her again to look for other options in terms of living situations and to speak to her land lord. Reminded patient of her appointment with the allergist on 11/12. Will get home health to send social worker out to see if there is anything they can do. Referral to [facility] also placed to help with her living situation.       Relevant Orders   Ambulatory referral to [facility]   Poor historian    Patient brings in a bag of medicines today- 1/2 of her medications on her list are not in this bag, including her clonidine. Very unclear what medications she is taking and where she is getting them from. Will get home health out to get a list of what medications she is taking on a daily basis and help with med management.       Relevant Orders   Ambulatory referral to [facility]    Other Visit Diagnoses    Dysuria       Concern about her urine burning as coming from mold exposure. UA clear today. See discussion under mold exposure.    Relevant Orders   UA/M w/rflx Culture, Routine       Follow up plan: Return in about 4 weeks (around 09/02/2017) for follow up on mood should patient decide to continue with this practice. Marland Kitchen

## 2017-08-05 NOTE — Assessment & Plan Note (Addendum)
Patient brings in a bag of medicines today- 1/2 of her medications on her list are not in this bag, including her clonidine. Very unclear what medications she is taking and where she is getting them from. Will get home health out to get a list of what medications she is taking on a daily basis and help with med management.

## 2017-08-05 NOTE — Assessment & Plan Note (Addendum)
Not under good control. Patient left the office prior to discussing her depression. Patient is at max dose of her sertraline. Not comfortable giving her wellbutrin with her labile blood pressure. Called patient after she left as she may be a good candidate for abilify. She agreed to try it. Follow up 1 month.

## 2017-08-05 NOTE — Patient Instructions (Addendum)
APPOINTMENT WITH THE ALLERGIST Mon 08/12/17 08:45 AM  Allen, Gloria Lee, [id number]  [place], [place] [id number]   Stop the cyclobenzaprine and just continue on the orphenadrine for your muscle spasms.

## 2017-08-05 NOTE — Assessment & Plan Note (Addendum)
PMP reviewed. Several doctors prescribing pain medication. Has been getting her tramadol from her former PCP with refills left over. Has been getting oxycodone from her orthopedist. Had been getting clonazepam from her former PCP. No Rx on file for oxazepam. Patient violated her last controlled substance agreement with her PCP, which is why she can't get her Rxs from them and switched here. Informed patient again that we do not do long term pain management in this office and will refer her to a different pain management doctor. She does not want to see another pain management doctor. Advised her that her other option, should she decide to continue her pain management here, would be to wean off her tramadol. She is aware. Patient left the office prior to finishing this conversation.

## 2017-08-05 NOTE — Assessment & Plan Note (Signed)
Better on recheck. Unsure what medications she is actually taking- clonidine not in the bag of medicine she brought today. Will get home health in to check on what medications she is actually taking. Await their input.

## 2017-08-08 ENCOUNTER — Other Ambulatory Visit: Payer: Self-pay | Admitting: Family Medicine

## 2017-08-08 ENCOUNTER — Telehealth: Payer: Self-pay

## 2017-08-08 DIAGNOSIS — I10 Essential (primary) hypertension: Secondary | ICD-10-CM | POA: Diagnosis not present

## 2017-08-08 DIAGNOSIS — G894 Chronic pain syndrome: Secondary | ICD-10-CM | POA: Diagnosis not present

## 2017-08-08 NOTE — Telephone Encounter (Signed)
Pt   Is  Requesting   A  Refill of  Ditropan     Please  Review

## 2017-08-08 NOTE — Telephone Encounter (Signed)
Noted  

## 2017-08-08 NOTE — Telephone Encounter (Signed)
Received a voicemail form Pamala Hurry at [facility]  She went out to see the patient, her BP was 168 80 today.  She states that the patient does not have 3 of her blood pressure medications, she spoke with the pharmacy and they are going to refill 2 of the 3 but the Metoprolol was filled in September for a 90 day supply, so they can not refill it at this time. Nurse states that she honestly does not know what the patient is actually taking.   Nurse spoke with patient about medication adherence, patient states that she  Does not have trouble remembering to take the medication, she just doesn't  take it because she feels like it is too much medication to be taking.   They are scheduled to see the patient 3 times next week, and are planning on getting her a pill box to help with medication adherence.

## 2017-08-09 DIAGNOSIS — I10 Essential (primary) hypertension: Secondary | ICD-10-CM | POA: Diagnosis not present

## 2017-08-09 DIAGNOSIS — G894 Chronic pain syndrome: Secondary | ICD-10-CM | POA: Diagnosis not present

## 2017-08-09 NOTE — Telephone Encounter (Signed)
ERROR

## 2017-08-12 DIAGNOSIS — K219 Gastro-esophageal reflux disease without esophagitis: Secondary | ICD-10-CM | POA: Diagnosis not present

## 2017-08-12 DIAGNOSIS — J301 Allergic rhinitis due to pollen: Secondary | ICD-10-CM | POA: Diagnosis not present

## 2017-08-12 DIAGNOSIS — R05 Cough: Secondary | ICD-10-CM | POA: Diagnosis not present

## 2017-08-13 DIAGNOSIS — G894 Chronic pain syndrome: Secondary | ICD-10-CM | POA: Diagnosis not present

## 2017-08-13 DIAGNOSIS — I10 Essential (primary) hypertension: Secondary | ICD-10-CM | POA: Diagnosis not present

## 2017-08-14 ENCOUNTER — Telehealth: Payer: Self-pay | Admitting: Family Medicine

## 2017-08-14 DIAGNOSIS — G894 Chronic pain syndrome: Secondary | ICD-10-CM | POA: Diagnosis not present

## 2017-08-14 DIAGNOSIS — I10 Essential (primary) hypertension: Secondary | ICD-10-CM | POA: Diagnosis not present

## 2017-08-14 NOTE — Telephone Encounter (Signed)
Copied from [facility] #[id number]. Topic: Quick Communication - See Telephone Encounter >> Aug 14, 2017  2:23 PM Robina Ade, Helene Kelp D wrote: CRM for notification. See Telephone encounter for: 08/14/17. Tiffany with [facility] called to let Dr. Wynetta Emery know that patient is complaining about general pain and her tramadol is ineffective. She would like to talk to [facility] or provider about this. Her number is [phone]

## 2017-08-15 NOTE — Telephone Encounter (Signed)
Agree with below. Needs appointment.

## 2017-08-15 NOTE — Telephone Encounter (Signed)
Called and spoke to [place]. She states that the patient has been complaining of generalized abdominal and leg pain. Some arm and back pain as well. She states that the patient has tramadol but it is not working. Dr. Wynetta Emery over heard the conversation and said that she referred this patient to pain management. Tiffany stated that the patient went to them but they did not give her anything. Dr. Wynetta Emery said that the patient would need an appointment and Tiffany stated that she would let Ms. Kilpatrick know. I asked for her to have the patient call us to schedule an appointment. Will route to Dr. Wynetta Emery so she can sign off since she spoke verbally.

## 2017-08-16 ENCOUNTER — Ambulatory Visit (INDEPENDENT_AMBULATORY_CARE_PROVIDER_SITE_OTHER): Payer: Medicare Other | Admitting: Family Medicine

## 2017-08-16 ENCOUNTER — Other Ambulatory Visit: Payer: Self-pay | Admitting: Family Medicine

## 2017-08-16 ENCOUNTER — Encounter: Payer: Self-pay | Admitting: Family Medicine

## 2017-08-16 VITALS — BP 172/82 | HR 109 | Temp 98.1°F | Wt 169.0 lb

## 2017-08-16 DIAGNOSIS — F339 Major depressive disorder, recurrent, unspecified: Secondary | ICD-10-CM

## 2017-08-16 DIAGNOSIS — G894 Chronic pain syndrome: Secondary | ICD-10-CM | POA: Diagnosis not present

## 2017-08-16 DIAGNOSIS — Z748 Other problems related to care provider dependency: Secondary | ICD-10-CM | POA: Diagnosis not present

## 2017-08-16 DIAGNOSIS — K219 Gastro-esophageal reflux disease without esophagitis: Secondary | ICD-10-CM | POA: Diagnosis not present

## 2017-08-16 MED ORDER — ORPHENADRINE CITRATE ER 100 MG PO TB12: 100.0000 mg | ORAL_TABLET | Freq: Two times a day (BID) | ORAL | 6 refills | Status: DC | PRN

## 2017-08-16 MED ORDER — MONTELUKAST SODIUM 10 MG PO TABS: 10.0000 mg | ORAL_TABLET | Freq: Every evening | ORAL | 1 refills | Status: DC | PRN

## 2017-08-16 MED ORDER — ALBUTEROL SULFATE HFA 108 (90 BASE) MCG/ACT IN AERS: 2.0000 | INHALATION_SPRAY | Freq: Four times a day (QID) | RESPIRATORY_TRACT | 3 refills | Status: DC | PRN

## 2017-08-16 MED ORDER — OMEPRAZOLE 40 MG PO CPDR: 40.0000 mg | DELAYED_RELEASE_CAPSULE | Freq: Two times a day (BID) | ORAL | 1 refills | Status: DC

## 2017-08-16 NOTE — Patient Instructions (Signed)
orphenadrine (NORFLEX) 100 MG tablet- muscle relaxer Start taking your omeprazole 2x a day for the next month You will be getting a call from the pain doctor and the psychiatrist You will also be getting a call for transportation to help get to those appointmentst

## 2017-08-16 NOTE — Progress Notes (Signed)
BP (!) 172/82 (BP Location: Left Arm, Patient Position: Sitting, Cuff Size: Normal)   Pulse (!) 109   Temp 98.1 F (36.7 C)   Wt 169 lb (76.7 kg)   SpO2 97%   BMI 28.12 kg/m    Subjective:    Patient ID: Gloria Allen, female    DOB: [birth year], 71 y.o.   MRN: [id number]  HPI: Gloria Allen is a 71 y.o. female  Chief Complaint  Patient presents with  . Back Pain  . Neck Pain   CHRONIC PAIN- patient does not want any interventional pain management. Pain management declined to take over her opiate pain management. They advised her that she should be off all benzodiazepines prior to taking any opiates. Discussed last visit that we will not do chronic pain management with her, and can wean her off her chronic opiates or refer her to another pain management- at that point she did not want to see pain manangement, but left before the conversation could be completed. Today, this was discussed further and she does not want to come off her medicine and would like to see another pain doctor, but will need some help with transportation.  Pain control status: uncontrolled Duration: chronic Location: widespread, especially back, neck and shoulder Quality: throbbing and aching Current Pain Level: 8/10 Previous Pain Level: 8/10 Breakthrough pain: yes Benefit from narcotic medications: yes What Activities task can be accomplished with current medication? Able to get around Interested in weaning off narcotics:no   Stool softners/OTC fiber: no  Previous pain specialty evaluation: yes Non-narcotic analgesic meds: no Narcotic contract: no  ANXIETY/STRESS- no better on the abilify, now having a lot of panic attacks, feeling terrible Duration:worse Anxious mood: yes  Excessive worrying: yes Irritability: yes  Sweating: no Nausea: no Palpitations:no Hyperventilation: no Panic attacks: yes Agoraphobia: no  Obscessions/compulsions: no Depressed mood: yes Depression screen [facility] 2/9 08/05/2017  07/31/2017 07/05/2017 06/20/2017 06/11/2017  Decreased Interest 0 0 0 0 3  Down, Depressed, Hopeless 2 0 1 3 3   PHQ - 2 Score 2 0 1 3 6   Altered sleeping 3 - 2 2 3   Tired, decreased energy 3 - 3 3 3   Change in appetite 2 - 2 3 3   Feeling bad or failure about yourself  0 - 0 1 3  Trouble concentrating 3 - 0 0 3  Moving slowly or fidgety/restless 2 - 1 0 2  Suicidal thoughts 0 - 0 0 0  PHQ-9 Score 15 - 9 12 23   Difficult doing work/chores - - Somewhat difficult Somewhat difficult Not difficult at all   Anhedonia: no Weight changes: no Insomnia: yes hard to fall asleep  Hypersomnia: no Fatigue/loss of energy: yes Feelings of worthlessness: yes Feelings of guilt: yes Impaired concentration/indecisiveness: yes Suicidal ideations: no  Crying spells: yes Recent Stressors/Life Changes: no  Note from ENT reviewed, they think that much of her cough may be related to LPR. She denies any reflux symptoms  Relevant past medical, surgical, family and social history reviewed and updated as indicated. Interim medical history since our last visit reviewed. Allergies and medications reviewed and updated.  Review of Systems  Constitutional: Negative.   Respiratory: Negative.   Cardiovascular: Negative.   Musculoskeletal: Positive for arthralgias, back pain, myalgias, neck pain and neck stiffness. Negative for gait problem and joint swelling.  Psychiatric/Behavioral: Negative.     Per HPI unless specifically indicated above     Objective:    BP (!) 172/82 (BP Location: Left  Arm, Patient Position: Sitting, Cuff Size: Normal)   Pulse (!) 109   Temp 98.1 F (36.7 C)   Wt 169 lb (76.7 kg)   SpO2 97%   BMI 28.12 kg/m   Wt Readings from Last 3 Encounters:  08/16/17 169 lb (76.7 kg)  08/05/17 167 lb (75.8 kg)  07/31/17 160 lb (72.6 kg)    Physical Exam  Constitutional: She is oriented to person, place, and time. She appears well-developed and well-nourished. No distress.  HENT:  Head:  Normocephalic and atraumatic.  Right Ear: Hearing normal.  Left Ear: Hearing normal.  Nose: Nose normal.  Eyes: Conjunctivae and lids are normal. Right eye exhibits no discharge. Left eye exhibits no discharge. No scleral icterus.  Cardiovascular: Normal rate, regular rhythm, normal heart sounds and intact distal pulses. Exam reveals no gallop and no friction rub.  No murmur heard. Pulmonary/Chest: Effort normal and breath sounds normal. No respiratory distress. She has no wheezes. She has no rales. She exhibits no tenderness.  Musculoskeletal: She exhibits tenderness. She exhibits no edema or deformity.  Neurological: She is alert and oriented to person, place, and time.  Skin: Skin is warm, dry and intact. No rash noted. She is not diaphoretic. No erythema. No pallor.  Psychiatric: She has a normal mood and affect. Her speech is normal and behavior is normal. Judgment and thought content normal. Cognition and memory are normal.  Nursing note and vitals reviewed.   Results for orders placed or performed in visit on 08/05/17  UA/M w/rflx Culture, Routine  Result Value Ref Range   Specific Gravity, UA 1.015 1.005 - 1.030   pH, UA 5.5 5.0 - 7.5   Color, UA Yellow Yellow   Appearance Ur Turbid (A) Clear   Leukocytes, UA Negative Negative   Protein, UA Negative Negative/Trace   Glucose, UA Negative Negative   Ketones, UA Negative Negative   RBC, UA Negative Negative   Bilirubin, UA Negative Negative   Urobilinogen, Ur 0.2 0.2 - 1.0 mg/dL   Nitrite, UA Negative Negative      Assessment & Plan:   Problem List Items Addressed This Visit      Respiratory   Laryngopharyngeal reflux (LPR) - Primary    Will increase her omeprazole to BID x 1 month, if no better, will refer to GI.        Other   Chronic pain syndrome (Chronic)    PMP reviewed last visit. Several doctors prescribing pain medication. Has been getting her tramadol from her former PCP with refills left over. Has been  getting oxycodone from her orthopedist. Had been getting clonazepam from her former PCP. No Rx on file for oxazepam. Patient violated her last controlled substance agreement with her PCP, which is why she can't get her Rxs from them and switched here. Informed patient again that we do not do long term pain management in this office and will refer her to a different pain management doctor. Today, she decided that she would like to see pain management. Referral to [facility] made today.       Relevant Orders   Ambulatory referral to Pain Clinic   Depression    Worse on the abilify. Referral to psychiatry made today.      Relevant Orders   Ambulatory referral to Psychiatry    Other Visit Diagnoses    Assistance needed with transportation       REferral to [facility] made today.   Relevant Orders   Ambulatory referral to [facility]       Follow up plan: Return in about 4 weeks (around 09/13/2017) for Follow up belly pain.

## 2017-08-16 NOTE — Assessment & Plan Note (Signed)
Will increase her omeprazole to BID x 1 month, if no better, will refer to GI.

## 2017-08-16 NOTE — Assessment & Plan Note (Signed)
PMP reviewed last visit. Several doctors prescribing pain medication. Has been getting her tramadol from her former PCP with refills left over. Has been getting oxycodone from her orthopedist. Had been getting clonazepam from her former PCP. No Rx on file for oxazepam. Patient violated her last controlled substance agreement with her PCP, which is why she can't get her Rxs from them and switched here. Informed patient again that we do not do long term pain management in this office and will refer her to a different pain management doctor. Today, she decided that she would like to see pain management. Referral to [facility] made today.

## 2017-08-16 NOTE — Assessment & Plan Note (Signed)
Worse on the abilify. Referral to psychiatry made today.

## 2017-08-20 DIAGNOSIS — G894 Chronic pain syndrome: Secondary | ICD-10-CM | POA: Diagnosis not present

## 2017-08-20 DIAGNOSIS — I10 Essential (primary) hypertension: Secondary | ICD-10-CM | POA: Diagnosis not present

## 2017-08-21 DIAGNOSIS — G894 Chronic pain syndrome: Secondary | ICD-10-CM | POA: Diagnosis not present

## 2017-08-21 DIAGNOSIS — I10 Essential (primary) hypertension: Secondary | ICD-10-CM | POA: Diagnosis not present

## 2017-08-23 DIAGNOSIS — G894 Chronic pain syndrome: Secondary | ICD-10-CM | POA: Diagnosis not present

## 2017-08-23 DIAGNOSIS — I10 Essential (primary) hypertension: Secondary | ICD-10-CM | POA: Diagnosis not present

## 2017-08-27 ENCOUNTER — Other Ambulatory Visit: Payer: Self-pay | Admitting: Family Medicine

## 2017-08-27 DIAGNOSIS — I10 Essential (primary) hypertension: Secondary | ICD-10-CM | POA: Diagnosis not present

## 2017-08-27 DIAGNOSIS — G894 Chronic pain syndrome: Secondary | ICD-10-CM | POA: Diagnosis not present

## 2017-08-27 MED ORDER — CYCLOBENZAPRINE HCL 5 MG PO TABS: 5.0000 mg | ORAL_TABLET | Freq: Every evening | ORAL | 1 refills | Status: DC | PRN

## 2017-08-29 ENCOUNTER — Telehealth: Payer: Self-pay | Admitting: Family Medicine

## 2017-08-29 DIAGNOSIS — G894 Chronic pain syndrome: Secondary | ICD-10-CM | POA: Diagnosis not present

## 2017-08-29 DIAGNOSIS — I10 Essential (primary) hypertension: Secondary | ICD-10-CM | POA: Diagnosis not present

## 2017-08-29 NOTE — Telephone Encounter (Signed)
I would recommend that he call her orthopedist. She sees Dr. Joselyn Arrow for djd of her neck- which may be causing her shoulder pain or Dr. Mack Guise who did her shoulder surgery. If she can't get in with either of those, I would strongly recommend she be seen.

## 2017-08-30 NOTE — Telephone Encounter (Signed)
Patient states that she has went and see those providers, and they all tell her to follow up with her primary care doctor.  I explained that you would need her to see a specialist, that I didn't think that you could increase her Tramadol, but she was insisting that I let you know.

## 2017-08-30 NOTE — Telephone Encounter (Signed)
Patient notified

## 2017-08-30 NOTE — Telephone Encounter (Signed)
Copied from [place] [phone]. Topic: Inquiry >> Aug 30, 2017  9:57 AM Conception Chancy, NT wrote: Reason for CRM: Gloria Allen is calling from [facility] and states Dr. Trula Ore said based on history the pt is not a candidate.  [place] did not leave number just wanted me to pass this message on.

## 2017-08-30 NOTE — Telephone Encounter (Signed)
Just got a message that [facility] will not prescribe her chronic pain medicine. We can refer to her elsewhere, she can come in and we can wean her off her chronic pain medication or she is welcome to try to find a different PCP who will prescribe her chronic pain medication. That is up to her.

## 2017-08-30 NOTE — Telephone Encounter (Signed)
She has a referral to the pain clinic in [place]. If this has become a chronic pain issue, she will need to follow up with them. I have told her already that I'm not going to prescribe her chronic pain medication.

## 2017-09-02 ENCOUNTER — Telehealth: Payer: Self-pay | Admitting: Family Medicine

## 2017-09-02 NOTE — Telephone Encounter (Signed)
Copied from [place] [id number]. Topic: Inquiry >> Sep 02, 2017  9:57 AM Oliver Pila B wrote: Reason for CRM: Fraser Din from [employer] called asked about the fax document of current medications for the Pt, they will be her main pharmacy and are making sure that you received the request for her currents meds, contact Pat @ [phone] or [phone], then the fax is [phone]  >> Sep 02, 2017  3:25 PM Reel, Rexene Edison, CMA wrote: Dr. Wynetta Emery, I think this is in your paperwork.

## 2017-09-02 NOTE — Telephone Encounter (Signed)
Tiff- I did this Tuesday. It's not in my papers now.

## 2017-09-03 ENCOUNTER — Other Ambulatory Visit: Payer: Self-pay | Admitting: Family Medicine

## 2017-09-03 DIAGNOSIS — I10 Essential (primary) hypertension: Secondary | ICD-10-CM | POA: Diagnosis not present

## 2017-09-03 DIAGNOSIS — G894 Chronic pain syndrome: Secondary | ICD-10-CM | POA: Diagnosis not present

## 2017-09-03 NOTE — Telephone Encounter (Signed)
Gloria Allen from [facility] called in to follow up on medication Rx request form. Judson Roch says that form or copy/list of medications, dosage and directions for medication is needed. Pt is new to their pharmacy so they need information to update system.    CB: [id number]

## 2017-09-03 NOTE — Telephone Encounter (Signed)
All set- bringing it to you now.

## 2017-09-04 ENCOUNTER — Ambulatory Visit: Payer: Self-pay | Admitting: Family Medicine

## 2017-09-06 ENCOUNTER — Ambulatory Visit: Payer: Self-pay | Admitting: Family Medicine

## 2017-09-06 DIAGNOSIS — G894 Chronic pain syndrome: Secondary | ICD-10-CM | POA: Diagnosis not present

## 2017-09-06 DIAGNOSIS — I10 Essential (primary) hypertension: Secondary | ICD-10-CM | POA: Diagnosis not present

## 2017-09-12 ENCOUNTER — Telehealth: Payer: Self-pay

## 2017-09-12 DIAGNOSIS — G894 Chronic pain syndrome: Secondary | ICD-10-CM | POA: Diagnosis not present

## 2017-09-12 DIAGNOSIS — I10 Essential (primary) hypertension: Secondary | ICD-10-CM | POA: Diagnosis not present

## 2017-09-12 NOTE — Telephone Encounter (Signed)
Copied from [place]. Topic: Referral - Request >> Sep 12, 2017  2:18 PM Scherrie Gerlach wrote: Reason for CRM: tiffany with [facility] home health is with the pt. No one has concerning pain management referral. Tiffany states she has spoke with [facility] advised they could see the pt. Would like you to send new referral to them. Their phone number is [phone]  >> Sep 12, 2017  3:07 PM Synthia Innocent wrote: Jonelle Sidle returning call

## 2017-09-12 NOTE — Telephone Encounter (Signed)
Spoke with Gloria Allen, let her know that the patient has already been seen by [facility] pain clinic.

## 2017-09-12 NOTE — Telephone Encounter (Signed)
I think this should go to you, she is not a [employer] patient.

## 2017-09-17 ENCOUNTER — Telehealth: Payer: Self-pay | Admitting: Family Medicine

## 2017-09-17 DIAGNOSIS — I10 Essential (primary) hypertension: Secondary | ICD-10-CM | POA: Diagnosis not present

## 2017-09-17 DIAGNOSIS — G894 Chronic pain syndrome: Secondary | ICD-10-CM | POA: Diagnosis not present

## 2017-09-17 NOTE — Telephone Encounter (Signed)
If patient wants these, not pharmacy, I can send them in.

## 2017-09-17 NOTE — Telephone Encounter (Signed)
Could not find on medication list. Please advise.

## 2017-09-17 NOTE — Telephone Encounter (Signed)
Copied from [place]. Topic: Quick Communication - Rx Refill/Question >> Sep 17, 2017  9:46 AM Arletha Grippe wrote: Has the patient contacted their pharmacy? Yes.     (Agent: If no, request that the patient contact the pharmacy for the refill.)   Preferred Pharmacy (with phone number or street name): [facility] called - folika-t , and hydrocortison valeratee cream - pharmacy would like to know we received that faxes.  Advised of turn around time of 48-72 hrs  ] Cb number is [phone]   Agent: Please be advised that RX refills may take up to 3 business days. We ask that you follow-up with your pharmacy.

## 2017-09-17 NOTE — Telephone Encounter (Signed)
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

## 2017-09-20 ENCOUNTER — Ambulatory Visit: Payer: Medicare Other | Admitting: Family Medicine

## 2017-09-26 DIAGNOSIS — G894 Chronic pain syndrome: Secondary | ICD-10-CM | POA: Diagnosis not present

## 2017-09-26 DIAGNOSIS — I10 Essential (primary) hypertension: Secondary | ICD-10-CM | POA: Diagnosis not present

## 2017-09-27 ENCOUNTER — Telehealth: Payer: Self-pay | Admitting: Family Medicine

## 2017-09-27 ENCOUNTER — Telehealth: Payer: Self-pay

## 2017-09-27 NOTE — Telephone Encounter (Signed)
This has already been signed on paper and faxed in.

## 2017-09-27 NOTE — Telephone Encounter (Signed)
Verbal given 

## 2017-09-27 NOTE — Telephone Encounter (Signed)
I have already sent a message in regards to a fax to Dr. Wynetta Emery. Routing to her as FYI. One of the telephone encounters can be closed.

## 2017-09-27 NOTE — Telephone Encounter (Signed)
Copied from [place] [id number]. Topic: Quick Communication - See Telephone Encounter >> Sep 27, 2017  9:18 AM Ether Griffins B wrote: CRM for notification. See Telephone encounter for:  Wanting a verbal order or to fax back other order for hydrocortisone cream 0.2% call back number [phone] 09/27/17.

## 2017-09-27 NOTE — Telephone Encounter (Signed)
Received a fax from [place]. Request for Hydrocortisone Valerate Cream 0.2%. Quanity: 180grams Days Supply: 90 Sig: Appy a thin film 2 to 3 times daily to affected areas.   This medication is not in patient's current medication list.

## 2017-09-27 NOTE — Telephone Encounter (Signed)
I have already signed a paper and we faxed this back in. OK to give verbal order if need be

## 2017-10-02 DIAGNOSIS — M5030 Other cervical disc degeneration, unspecified cervical region: Secondary | ICD-10-CM | POA: Diagnosis not present

## 2017-10-07 ENCOUNTER — Other Ambulatory Visit: Payer: Self-pay | Admitting: Family Medicine

## 2017-10-09 DIAGNOSIS — M5030 Other cervical disc degeneration, unspecified cervical region: Secondary | ICD-10-CM | POA: Diagnosis not present

## 2017-10-09 DIAGNOSIS — Z79891 Long term (current) use of opiate analgesic: Secondary | ICD-10-CM | POA: Diagnosis not present

## 2017-10-18 ENCOUNTER — Other Ambulatory Visit: Payer: Self-pay | Admitting: Family Medicine

## 2017-10-31 ENCOUNTER — Other Ambulatory Visit: Payer: Self-pay | Admitting: Family Medicine

## 2017-11-03 ENCOUNTER — Other Ambulatory Visit: Payer: Self-pay | Admitting: Family Medicine

## 2017-11-04 ENCOUNTER — Other Ambulatory Visit: Payer: Self-pay | Admitting: Family Medicine

## 2017-11-05 NOTE — Telephone Encounter (Signed)
*  clnazepam refill Last OV: 08/16/17 Last Refill:08/25/17 Pharmacy: [facility]

## 2017-11-05 NOTE — Telephone Encounter (Signed)
I have never filled this medication.

## 2017-11-06 DIAGNOSIS — M47892 Other spondylosis, cervical region: Secondary | ICD-10-CM | POA: Diagnosis not present

## 2017-11-06 DIAGNOSIS — M5030 Other cervical disc degeneration, unspecified cervical region: Secondary | ICD-10-CM | POA: Diagnosis not present

## 2017-11-11 ENCOUNTER — Other Ambulatory Visit: Payer: Self-pay | Admitting: Family Medicine

## 2017-11-12 ENCOUNTER — Other Ambulatory Visit: Payer: Self-pay | Admitting: Family Medicine

## 2017-11-12 DIAGNOSIS — I10 Essential (primary) hypertension: Secondary | ICD-10-CM | POA: Diagnosis not present

## 2017-11-12 NOTE — Telephone Encounter (Signed)
Flexeril refill request  Controlled substance Last OV 08/16/17 [place] [place] at [address]

## 2017-11-28 ENCOUNTER — Telehealth: Payer: Self-pay | Admitting: Family Medicine

## 2017-11-28 NOTE — Telephone Encounter (Signed)
I have never written that Rx.

## 2017-11-28 NOTE — Telephone Encounter (Signed)
Copied from [place]. Topic: Quick Communication - See Telephone Encounter >> Nov 28, 2017  9:25 AM Ivar Drape wrote: CRM for notification. See Telephone encounter for:  11/28/17. Patty w/[facility] [phone] expressed that a fax was sent on 11/21/17 for Desoximetasone .05% for skin care prescription. She expressed that they can take a verbal order or Fax it to: [phone]

## 2017-11-28 NOTE — Telephone Encounter (Signed)
Patient notified

## 2017-12-04 DIAGNOSIS — H35013 Changes in retinal vascular appearance, bilateral: Secondary | ICD-10-CM | POA: Diagnosis not present

## 2017-12-04 DIAGNOSIS — H04123 Dry eye syndrome of bilateral lacrimal glands: Secondary | ICD-10-CM | POA: Diagnosis not present

## 2017-12-04 DIAGNOSIS — H26069 Combined forms of infantile and juvenile cataract, unspecified eye: Secondary | ICD-10-CM | POA: Diagnosis not present

## 2017-12-06 ENCOUNTER — Other Ambulatory Visit: Payer: Self-pay | Admitting: Family Medicine

## 2017-12-09 NOTE — Telephone Encounter (Signed)
Needs a follow up

## 2017-12-19 ENCOUNTER — Encounter: Payer: Self-pay | Admitting: Family Medicine

## 2018-01-06 ENCOUNTER — Other Ambulatory Visit: Payer: Self-pay | Admitting: Family Medicine

## 2018-01-06 NOTE — Telephone Encounter (Signed)
Spoke with pt and she scheduled for tomorrow. She stated that her nerves were getting the best of her and she could barely stand herself. I explained that she hadn't been seen in 6 months and she would have to be seen since she no showed her last appts. She said she would be here tomorrow unless something came up. And I just let her know again that unless she kept her appt she would not be able to get a refill.

## 2018-01-06 NOTE — Telephone Encounter (Signed)
Needs appointment

## 2018-01-07 ENCOUNTER — Ambulatory Visit: Payer: Medicare Other | Admitting: Family Medicine

## 2018-01-14 ENCOUNTER — Encounter: Payer: Self-pay | Admitting: Family Medicine

## 2018-01-14 DIAGNOSIS — R5383 Other fatigue: Secondary | ICD-10-CM | POA: Diagnosis not present

## 2018-01-14 DIAGNOSIS — R079 Chest pain, unspecified: Secondary | ICD-10-CM | POA: Diagnosis not present

## 2018-01-14 DIAGNOSIS — I10 Essential (primary) hypertension: Secondary | ICD-10-CM | POA: Diagnosis not present

## 2018-01-14 DIAGNOSIS — Z7689 Persons encountering health services in other specified circumstances: Secondary | ICD-10-CM | POA: Diagnosis not present

## 2018-01-14 DIAGNOSIS — R609 Edema, unspecified: Secondary | ICD-10-CM | POA: Diagnosis not present

## 2018-01-14 DIAGNOSIS — R05 Cough: Secondary | ICD-10-CM | POA: Diagnosis not present

## 2018-01-15 ENCOUNTER — Other Ambulatory Visit: Payer: Self-pay

## 2018-01-15 ENCOUNTER — Emergency Department
Admission: EM | Admit: 2018-01-15 | Discharge: 2018-01-15 | Disposition: A | Discharge status: Home / Self Care | Payer: Medicare Other | Attending: Student in an Organized Health Care Education/Training Program | Admitting: Student in an Organized Health Care Education/Training Program

## 2018-01-15 ENCOUNTER — Encounter: Payer: Self-pay | Admitting: *Deleted

## 2018-01-15 ENCOUNTER — Emergency Department: Payer: Medicare Other

## 2018-01-15 DIAGNOSIS — R600 Localized edema: Secondary | ICD-10-CM | POA: Diagnosis not present

## 2018-01-15 DIAGNOSIS — R05 Cough: Secondary | ICD-10-CM | POA: Diagnosis not present

## 2018-01-15 DIAGNOSIS — R0602 Shortness of breath: Secondary | ICD-10-CM | POA: Diagnosis not present

## 2018-01-15 DIAGNOSIS — Z87891 Personal history of nicotine dependence: Secondary | ICD-10-CM | POA: Insufficient documentation

## 2018-01-15 DIAGNOSIS — R0789 Other chest pain: Secondary | ICD-10-CM | POA: Diagnosis not present

## 2018-01-15 DIAGNOSIS — I5020 Unspecified systolic (congestive) heart failure: Secondary | ICD-10-CM | POA: Diagnosis not present

## 2018-01-15 DIAGNOSIS — R079 Chest pain, unspecified: Secondary | ICD-10-CM | POA: Diagnosis not present

## 2018-01-15 DIAGNOSIS — Z79899 Other long term (current) drug therapy: Secondary | ICD-10-CM | POA: Insufficient documentation

## 2018-01-15 DIAGNOSIS — I10 Essential (primary) hypertension: Secondary | ICD-10-CM

## 2018-01-15 DIAGNOSIS — R609 Edema, unspecified: Secondary | ICD-10-CM | POA: Diagnosis not present

## 2018-01-15 LAB — CBC WITH DIFFERENTIAL/PLATELET
BASOS ABS: 0.1 10*3/uL (ref 0–0.1)
Basophils Relative: 1 %
Eosinophils Absolute: 0.3 10*3/uL (ref 0–0.7)
Eosinophils Relative: 3 %
HEMATOCRIT: 36.2 % (ref 35.0–47.0)
Hemoglobin: 12 g/dL (ref 12.0–16.0)
Lymphocytes Relative: 23 %
Lymphs Abs: 2.1 10*3/uL (ref 1.0–3.6)
MCH: 28.2 pg (ref 26.0–34.0)
MCHC: 33.1 g/dL (ref 32.0–36.0)
MCV: 85.1 fL (ref 80.0–100.0)
MONO ABS: 0.5 10*3/uL (ref 0.2–0.9)
Monocytes Relative: 5 %
NEUTROS ABS: 6.4 10*3/uL (ref 1.4–6.5)
Neutrophils Relative %: 68 %
Platelets: 310 10*3/uL (ref 150–440)
RBC: 4.25 MIL/uL (ref 3.80–5.20)
RDW: 14.6 % — AB (ref 11.5–14.5)
WBC: 9.4 10*3/uL (ref 3.6–11.0)

## 2018-01-15 LAB — COMPREHENSIVE METABOLIC PANEL
ALT: 12 U/L — ABNORMAL LOW (ref 14–54)
ANION GAP: 7 (ref 5–15)
AST: 22 U/L (ref 15–41)
Albumin: 4 g/dL (ref 3.5–5.0)
Alkaline Phosphatase: 116 U/L (ref 38–126)
BILIRUBIN TOTAL: 0.5 mg/dL (ref 0.3–1.2)
BUN: 8 mg/dL (ref 6–20)
CO2: 26 mmol/L (ref 22–32)
Calcium: 8.8 mg/dL — ABNORMAL LOW (ref 8.9–10.3)
Chloride: 109 mmol/L (ref 101–111)
Creatinine, Ser: 0.67 mg/dL (ref 0.44–1.00)
GFR calc Af Amer: >60 mL/min (ref >60)
GFR calc non Af Amer: >60 mL/min (ref >60)
GLUCOSE: 117 mg/dL — AB (ref 65–99)
POTASSIUM: 3 mmol/L — AB (ref 3.5–5.1)
SODIUM: 142 mmol/L (ref 135–145)
Total Protein: 7.3 g/dL (ref 6.5–8.1)

## 2018-01-15 LAB — TROPONIN I: Troponin I: <0.03 ng/mL (ref <0.03)

## 2018-01-15 LAB — BRAIN NATRIURETIC PEPTIDE: B Natriuretic Peptide: 158 pg/mL — ABNORMAL HIGH (ref 0.0–100.0)

## 2018-01-15 MED ORDER — ONDANSETRON HCL 4 MG/2ML IJ SOLN
4.0000 mg | Freq: Once | INTRAMUSCULAR | Status: AC
  Administered 2018-01-15: 4 mg via INTRAVENOUS
  Filled 2018-01-15: qty 2

## 2018-01-15 MED ORDER — AMLODIPINE BESYLATE 5 MG PO TABS
10.0000 mg | ORAL_TABLET | Freq: Once | ORAL | Status: AC
  Administered 2018-01-15: 10 mg via ORAL
  Filled 2018-01-15: qty 2

## 2018-01-15 MED ORDER — HYDRALAZINE HCL 50 MG PO TABS
50.0000 mg | ORAL_TABLET | Freq: Once | ORAL | Status: AC
  Administered 2018-01-15: 50 mg via ORAL
  Filled 2018-01-15: qty 1

## 2018-01-15 MED ORDER — ACETAMINOPHEN 500 MG PO TABS
ORAL_TABLET | ORAL | Status: AC
  Administered 2018-01-15: 1000 mg via ORAL
  Filled 2018-01-15: qty 2

## 2018-01-15 MED ORDER — CLONIDINE HCL 0.1 MG PO TABS
0.3000 mg | ORAL_TABLET | Freq: Once | ORAL | Status: AC
  Administered 2018-01-15: 0.3 mg via ORAL
  Filled 2018-01-15: qty 3

## 2018-01-15 MED ORDER — FUROSEMIDE 10 MG/ML IJ SOLN
60.0000 mg | Freq: Once | INTRAMUSCULAR | Status: AC
  Administered 2018-01-15: 60 mg via INTRAVENOUS
  Filled 2018-01-15: qty 8

## 2018-01-15 MED ORDER — MORPHINE SULFATE (PF) 4 MG/ML IV SOLN
4.0000 mg | INTRAVENOUS | Status: DC | PRN
  Administered 2018-01-15: 4 mg via INTRAVENOUS
  Filled 2018-01-15: qty 1

## 2018-01-15 MED ORDER — ACETAMINOPHEN 500 MG PO TABS
1000.0000 mg | ORAL_TABLET | Freq: Once | ORAL | Status: AC
  Administered 2018-01-15: 1000 mg via ORAL

## 2018-01-15 NOTE — ED Provider Notes (Signed)
[facility] Emergency Department Provider Note    First MD Initiated Contact with Patient 01/15/18 1514     (approximate)  I have reviewed the triage vital signs and the nursing notes.   HISTORY  Chief Complaint Chest Pain and Leg Swelling    HPI Gloria Allen is a 72 y.o. female history of anxiety hypertension any dyspnea with exertion presents to the ER from [facility] due to chest tightness and leg swelling.  States these symptoms have been ongoing for the past couple of days but started getting worse this morning.  Denies any cough.  No pain radiating through to her back.  Does describe chest pressure.  Lately not on any home oxygen.  Denies any previous heart disease.  No nausea or vomiting.  Past Medical History:  Diagnosis Date  . Anxiety   . Arthritis   . Depression   . Dyspnea    with exertion  . Full thickness rotator cuff tear 06/12/2017  . GERD (gastroesophageal reflux disease)   . Headache   . Hx: UTI (urinary tract infection)   . Hypertension    Family History  Problem Relation Age of Onset  . Hypertension Mother    Past Surgical History:  Procedure Laterality Date  . ABDOMINAL HYSTERECTOMY    . APPENDECTOMY    . BREAST REDUCTION SURGERY Bilateral   . DILATION AND CURETTAGE OF UTERUS    . SHOULDER ARTHROSCOPY WITH OPEN ROTATOR CUFF REPAIR AND DISTAL CLAVICLE ACROMINECTOMY Right 03/26/2017   Procedure: right shoulder arthroscopy, arthroscopic subacromial decompression, distal clavicle excision, mini open rotator cuff repair;  Surgeon: Thornton Park, MD;  Location: [facility] ORS;  Service: Orthopedics;  Laterality: Right;   Patient Active Problem List   Diagnosis Date Noted  . Laryngopharyngeal reflux (LPR) 08/16/2017  . Suspected exposure to mold 08/05/2017  . Poor historian 08/05/2017  . DISH (diffuse idiopathic skeletal hyperostosis) 07/30/2017  . Cervical central spinal stenosis (C3-C7) (worse at C4-5) 07/30/2017  .  Cervical cord myelomalacia ([place]) 07/30/2017  . Cervical foraminal stenosis (C4-5 and C5-6) (Bilateral) (L>R) 07/30/2017  . DDD (degenerative disc disease), cervical 07/30/2017  . Cervical spondylosis with myelopathy and radiculopathy 07/30/2017  . DDD (degenerative disc disease), lumbar 07/30/2017  . Lumbar facet arthropathy (Bilateral) 07/30/2017  . Lumbar facet syndrome (Bilateral) (R>L) 07/30/2017  . Lumbar central spinal stenosis (L4-5) 07/30/2017  . Lumbar foraminal stenosis (Bilateral: L4-5) (Left: L5-S1) 07/30/2017  . Lumbar disc protrusion (Left: L5-S1) (Right: L1-2) 07/30/2017  . Disorder of skeletal system 07/30/2017  . Grade 1 Anterolisthesis of L4 over L5 07/30/2017  . Osteopenia of spine 07/30/2017  . Entrapment syndrome 06/20/2017  . PTSD (post-traumatic stress disorder) 06/20/2017  . Cervical arthritis 06/20/2017  . Bronchial asthma 06/20/2017  . Long term current use of opiate analgesic 06/11/2017  . Chronic pain syndrome 06/11/2017  . Chronic neck pain (Primary Area of Pain) (Bilateral) (R>L) 06/11/2017  . Chronic shoulder pain [facility] Area of Pain) (Right) 06/11/2017  . Chronic lower extremity pain (Fourth Area of Pain) (Bilateral) (R>L) 06/11/2017  . Chronic upper extremity pain (Fifth Area of Pain) (Bilateral) (R>L) 06/11/2017  . Chronic sacroiliac joint pain (Bilateral) (R>L) 06/11/2017  . Chronic low back pain (Secondary Area of Pain) (Bilateral) (R>L) 06/06/2017  . Anxiety   . Depression   . GERD (gastroesophageal reflux disease)   . Hypertension   . History of rotator cuff repair (Right) 03/26/2017  . Inflammation of joint of shoulder region 01/15/2017  . Osteoarthritis  of shoulder (Right) 01/15/2017      Prior to Admission medications   Medication Sig Start Date End Date Taking? Authorizing Provider  albuterol (PROVENTIL HFA;VENTOLIN HFA) 108 (90 Base) MCG/ACT inhaler Inhale 2 puffs every 6 (six) hours as needed into the lungs for wheezing or shortness  of breath. 08/16/17  Yes Johnson, Megan P, DO  amLODipine (NORVASC) 10 MG tablet Take 1 tablet (10 mg total) by mouth daily. 06/20/17  Yes Johnson, Megan P, DO  ANORO ELLIPTA 62.5-25 MCG/INH AEPB Inhale 1 puff into the lungs daily. 12/02/17  Yes [provider]  ARIPiprazole (ABILIFY) 5 MG tablet Take 1 tablet (5 mg total) daily by mouth. 08/05/17  Yes Johnson, Megan P, DO  clonazePAM (KLONOPIN) 0.5 MG tablet Take 0.5 mg by mouth 3 (three) times daily as needed for anxiety.    Yes [provider]  cloNIDine (CATAPRES) 0.3 MG tablet take 1 tablet by mouth twice a day for high blood pressure Patient taking differently: Take 1 tablet (0.3MG ) twice daily for blood pressure 11/04/17  Yes Johnson, Megan P, DO  cyclobenzaprine (FLEXERIL) 5 MG tablet TAKE 1 TABLET BY MOUTH AT BEDTIME AS NEEDED 11/12/17  Yes Johnson, Megan P, DO  furosemide (LASIX) 20 MG tablet Take 1 tablet by mouth daily. 01/14/18  Yes [provider]  hydrALAZINE (APRESOLINE) 50 MG tablet Take 1 tablet (50 mg total) by mouth 3 (three) times daily. 07/19/17  Yes Johnson, Megan P, DO  lidocaine (XYLOCAINE) 5 % ointment Apply 1 application topically as directed. 12/14/17  Yes [provider]  lovastatin (MEVACOR) 40 MG tablet Take 1 tablet by mouth at bedtime. 12/28/17  Yes [provider]  metoprolol succinate (TOPROL-XL) 100 MG 24 hr tablet Take 100 mg by mouth daily. Take with or immediately following a meal.   Yes [provider]  mirtazapine (REMERON) 15 MG tablet take 1 tablet by mouth at bedtime if needed for APPETITIE 10/18/17  Yes Johnson, Megan P, DO  mometasone (NASONEX) 50 MCG/ACT nasal spray Place 1 spray into the nose daily. 12/20/17  Yes [provider]  montelukast (SINGULAIR) 10 MG tablet Take 1 tablet (10 mg total) at bedtime as needed by mouth. 08/16/17  Yes Johnson, Megan P, DO  omega-3 acid ethyl esters (LOVAZA) 1 g capsule Take 1 capsule by mouth 3 (three) times daily.  12/23/17  Yes [provider]  omeprazole (PRILOSEC) 40 MG capsule Take 1 capsule (40 mg total) 2 (two) times daily by mouth. 08/16/17  Yes Johnson, Megan P, DO  sertraline (ZOLOFT) 100 MG tablet Take 2 tablets (200 mg total) by mouth at bedtime. 07/05/17  Yes Johnson, Megan P, DO  SYMBICORT 160-4.5 MCG/ACT inhaler Inhale 2 puffs into the lungs 2 (two) times daily. 01/14/18  Yes [provider]  terazosin (HYTRIN) 5 MG capsule Take 5 mg by mouth at bedtime.   Yes [provider]  traZODone (DESYREL) 50 MG tablet Take 0.5-1 tablets (25-50 mg total) by mouth at bedtime as needed for sleep. 07/05/17  Yes Johnson, Megan P, DO  oxybutynin (DITROPAN) 5 MG tablet take 1 tablet by mouth twice a day for bladder LEAKAGE Patient not taking: Reported on 01/15/2018 08/08/17   [place] Liter P, DO    Allergies Ace inhibitors and Angiotensin receptor blockers    Social History Social History   Tobacco Use  . Smoking status: Former Smoker    Packs/day: 1.00    Types: Cigarettes  . Smokeless tobacco: Never Used  .  Tobacco comment: "quit years ago"  Substance Use Topics  . Alcohol use: No  . Drug use: No    Review of Systems Patient denies headaches, rhinorrhea, blurry vision, numbness, shortness of breath, chest pain, edema, cough, abdominal pain, nausea, vomiting, diarrhea, dysuria, fevers, rashes or hallucinations unless otherwise stated above in HPI. ____________________________________________   PHYSICAL EXAM:  VITAL SIGNS: Vitals:   01/15/18 2000 01/15/18 2030  BP: (!) 192/65 (!) 176/66  Pulse:  70  Resp:  16  Temp:    SpO2:  99%    Constitutional: Alert and oriented. Well appearing and in no acute distress. Eyes: Conjunctivae are normal.  Head: Atraumatic. Nose: No congestion/rhinnorhea. Mouth/Throat: Mucous membranes are moist.   Neck: No stridor. Painless ROM.  Cardiovascular: Normal rate, regular rhythm. Grossly normal heart sounds.  Good peripheral  circulation. Respiratory: Normal respiratory effort.  No retractions. Lungs CTAB. Gastrointestinal: Soft and nontender. No distention. No abdominal bruits. No CVA tenderness. Genitourinary:  Musculoskeletal: No lower extremity tenderness.  1+ BLE.  No joint effusions. Neurologic:  Normal speech and language. No gross focal neurologic deficits are appreciated. No facial droop Skin:  Skin is warm, dry and intact. No rash noted. Psychiatric: Mood and affect are normal. Speech and behavior are normal.  ____________________________________________   LABS (all labs ordered are listed, but only abnormal results are displayed)  Results for orders placed or performed during the hospital encounter of 01/15/18 (from the past 24 hour(s))  CBC with Differential/Platelet     Status: Abnormal   Collection Time: 01/15/18  3:32 PM  Result Value Ref Range   WBC 9.4 3.6 - 11.0 K/uL   RBC 4.25 3.80 - 5.20 MIL/uL   Hemoglobin 12.0 12.0 - 16.0 g/dL   HCT 36.2 35.0 - 47.0 %   MCV 85.1 80.0 - 100.0 fL   MCH 28.2 26.0 - 34.0 pg   MCHC 33.1 32.0 - 36.0 g/dL   RDW 14.6 (H) 11.5 - 14.5 %   Platelets 310 150 - 440 K/uL   Neutrophils Relative % 68 %   Neutro Abs 6.4 1.4 - 6.5 K/uL   Lymphocytes Relative 23 %   Lymphs Abs 2.1 1.0 - 3.6 K/uL   Monocytes Relative 5 %   Monocytes Absolute 0.5 0.2 - 0.9 K/uL   Eosinophils Relative 3 %   Eosinophils Absolute 0.3 0 - 0.7 K/uL   Basophils Relative 1 %   Basophils Absolute 0.1 0 - 0.1 K/uL  Comprehensive metabolic panel     Status: Abnormal   Collection Time: 01/15/18  3:32 PM  Result Value Ref Range   Sodium 142 135 - 145 mmol/L   Potassium 3.0 (L) 3.5 - 5.1 mmol/L   Chloride 109 101 - 111 mmol/L   CO2 26 22 - 32 mmol/L   Glucose, Bld 117 (H) 65 - 99 mg/dL   BUN 8 6 - 20 mg/dL   Creatinine, Ser 0.67 0.44 - 1.00 mg/dL   Calcium 8.8 (L) 8.9 - 10.3 mg/dL   Total Protein 7.3 6.5 - 8.1 g/dL   Albumin 4.0 3.5 - 5.0 g/dL   AST 22 15 - 41 U/L   ALT 12 (L) 14 -  54 U/L   Alkaline Phosphatase 116 38 - 126 U/L   Total Bilirubin 0.5 0.3 - 1.2 mg/dL   GFR calc non Af Amer >60 >60 mL/min   GFR calc Af Amer >60 >60 mL/min   Anion gap 7 5 - 15  Troponin I  Status: None   Collection Time: 01/15/18  3:32 PM  Result Value Ref Range   Troponin I <0.03 <0.03 ng/mL  Brain natriuretic peptide     Status: Abnormal   Collection Time: 01/15/18  3:32 PM  Result Value Ref Range   B Natriuretic Peptide 158.0 (H) 0.0 - 100.0 pg/mL  Troponin I     Status: None   Collection Time: 01/15/18  5:43 PM  Result Value Ref Range   Troponin I <0.03 <0.03 ng/mL   ____________________________________________  EKG My review and personal interpretation at Time: 15:19   Indication: chest pain  Rate: 50  Rhythm: sinus Axis: left  Other: *normal intervals, no stemi, nonspecific st abn____________________________________________  RADIOLOGY  I personally reviewed all radiographic images ordered to evaluate for the above acute complaints and reviewed radiology reports and findings.  These findings were personally discussed with the patient.  Please see medical record for radiology report.  ____________________________________________   PROCEDURES  Procedure(s) performed:  Procedures    Critical Care performed: no ____________________________________________   INITIAL IMPRESSION / ASSESSMENT AND PLAN / ED COURSE  Pertinent labs & imaging results that were available during my care of the patient were reviewed by me and considered in my medical decision making (see chart for details).  DDX: chf, copd, acs, dissection, pna  Gloria Allen is a 72 y.o. who presents to the ED with symptoms as described above.  Patient with markedly elevated blood pressure but no hypoxia.  Will control blood pressure and keep patient on cardiac monitor.  Blood work will be sent for the above differential.  Abdominal exam soft and benign.  No evidence of anasarca.  EKG shows no evidence  of acute ischemia and initial troponin is negative both will further risk stratify with repeat enzyme.  Clinical Course as of Jan 15 2049  Wed Jan 15, 2018  1945 Patient reassessed.  She is able to ambulate with no hypoxia.  Blood pressure improving with restarting her home medications.  And I did recommend hospitalization for further blood pressure control and echo.  Patient is having adequate diuresis and states she would prefer to have this worked up as an outpatient.  She has no hypoxia or evidence of flash pulmonary edema no evidence of ACS do believe it is reasonable to have her follow-up with her PCP and cardiology.  Have discussed with the patient and available family all diagnostics and treatments performed thus far and all questions were answered to the best of my ability. The patient demonstrates understanding and agreement with plan.    [PR]  2025 Spoke with Dr. Ubaldo Glassing who kindly agrees to see patient tomorrow morning at 10:00.  Patient again able to ambulate with no distress.  No chest pain and no hypoxia.  Have discussed with the patient and available family all diagnostics and treatments performed thus far and all questions were answered to the best of my ability. The patient demonstrates understanding and agreement with plan.    [PR]    Clinical Course User Index [PR] Merlyn Lot, MD     As part of my medical decision making, I reviewed the following data within the [place] notes reviewed and incorporated, Labs reviewed, notes from prior ED visits and [place] Controlled Substance Database   ____________________________________________   FINAL CLINICAL IMPRESSION(S) / ED DIAGNOSES  Final diagnoses:  Peripheral edema  Hypertension, unspecified type      NEW MEDICATIONS STARTED DURING THIS VISIT:  New Prescriptions  No medications on file     Note:  This document was prepared using Dragon voice recognition software and may include  unintentional dictation errors.    Merlyn Lot, MD 01/15/18 2050

## 2018-01-15 NOTE — ED Notes (Addendum)
Pt able to ambulate without difficulty. Pt reports increased SOB while ambulating but states it has improved since medication was given. Oxygenation remained in the high to mid 90s. Decreased edema in feet.

## 2018-01-15 NOTE — ED Triage Notes (Signed)
Pt to ED from [facility] Urgent care rep[orting recurrent chest tightness with bilateral leg swelling and a reportedly enlarged heart after having had an xray performed. PT has hx of HTN but denies CHF. Intermittent SOB. PT is normally on RA but reports the 2L [place] with EMS helped to decrease her SOB.   Urgent care administered 2 nitro without relief of CP.

## 2018-01-16 DIAGNOSIS — R0602 Shortness of breath: Secondary | ICD-10-CM | POA: Diagnosis not present

## 2018-01-20 ENCOUNTER — Other Ambulatory Visit: Payer: Self-pay | Admitting: Family Medicine

## 2018-01-26 DIAGNOSIS — R0602 Shortness of breath: Secondary | ICD-10-CM | POA: Insufficient documentation

## 2018-02-05 DIAGNOSIS — R0602 Shortness of breath: Secondary | ICD-10-CM | POA: Diagnosis not present

## 2018-02-11 ENCOUNTER — Other Ambulatory Visit: Payer: Self-pay | Admitting: Family Medicine

## 2018-02-14 DIAGNOSIS — R0789 Other chest pain: Secondary | ICD-10-CM | POA: Diagnosis not present

## 2018-02-14 DIAGNOSIS — R0602 Shortness of breath: Secondary | ICD-10-CM | POA: Diagnosis not present

## 2018-02-27 DIAGNOSIS — R0789 Other chest pain: Secondary | ICD-10-CM | POA: Diagnosis not present

## 2018-03-04 ENCOUNTER — Other Ambulatory Visit: Payer: Self-pay | Admitting: Cardiology

## 2018-03-04 DIAGNOSIS — R0789 Other chest pain: Secondary | ICD-10-CM | POA: Insufficient documentation

## 2018-03-04 DIAGNOSIS — R270 Ataxia, unspecified: Secondary | ICD-10-CM | POA: Insufficient documentation

## 2018-03-04 DIAGNOSIS — R0602 Shortness of breath: Secondary | ICD-10-CM | POA: Diagnosis not present

## 2018-03-12 ENCOUNTER — Other Ambulatory Visit: Payer: Self-pay | Admitting: Family Medicine

## 2018-03-13 NOTE — Telephone Encounter (Signed)
hydralazine refill Last Refill:07/19/17 # 90 tab 3 RF Last OV: 07/19/17 PCP: Park Liter DO Pharmacy:[facility] [address] [place]  sertraline refill Last Refill:07/05/17 # 60 tab 3 RF Last OV: 08/05/17    trazodone refill Last Refill:07/05/17 # 30 3 RF Last OV: 07/05/17   Omeprazole refill Last Refill:08/16/17 # 60 cap 1 RF Last OV: 08/16/17   Aripiprazole refill Last Refill:08/05/17 # 30 tab 3 RF Last OV: 08/16/17   Proair refill Last Refill:08/16/17 # 1 inhaler 3 RF Last OV: no OV found   mirtazapine refill Last Refill:10/18/17 # 180 tab No RF Last OV: 06/06/17    hydroxyzine refill Last Refill:start date 05/06/17 end date 01/15/18 - not on med list Last OV: No OV found that addresses med   Metoprolol refill Last Refill:No start date / not and active med Last OV: 07/19/17  Triamcinolone cream refill Last Refill:completed course 01/15/18 Last OV: Historical provider   Anoro Ellipta refill Last Refill:12/02/17- No refill listed, no end date  Last OV: Historical med   terazosin refill Last Refill:no start date/ end date  Last OV: historical med

## 2018-03-17 ENCOUNTER — Encounter: Payer: Self-pay | Admitting: Family Medicine

## 2018-03-17 NOTE — Telephone Encounter (Signed)
Letter printed to be mailed.

## 2018-03-31 ENCOUNTER — Ambulatory Visit: Payer: Medicare Other

## 2018-04-02 ENCOUNTER — Telehealth: Payer: Self-pay | Admitting: Family Medicine

## 2018-04-02 MED ORDER — AMLODIPINE BESYLATE 10 MG PO TABS: 10.0000 mg | ORAL_TABLET | Freq: Every day | ORAL | 3 refills | Status: DC

## 2018-04-02 MED ORDER — MIRTAZAPINE 15 MG PO TABS: ORAL_TABLET | ORAL | 0 refills | Status: DC

## 2018-04-02 MED ORDER — ALBUTEROL SULFATE HFA 108 (90 BASE) MCG/ACT IN AERS: 2.0000 | INHALATION_SPRAY | Freq: Four times a day (QID) | RESPIRATORY_TRACT | 3 refills | Status: DC | PRN

## 2018-04-02 MED ORDER — OMEPRAZOLE 40 MG PO CPDR: 40.0000 mg | DELAYED_RELEASE_CAPSULE | Freq: Two times a day (BID) | ORAL | 1 refills | Status: DC

## 2018-04-02 MED ORDER — TRAZODONE HCL 50 MG PO TABS: 25.0000 mg | ORAL_TABLET | Freq: Every evening | ORAL | 3 refills | Status: DC | PRN

## 2018-04-02 MED ORDER — SERTRALINE HCL 100 MG PO TABS: 200.0000 mg | ORAL_TABLET | Freq: Every day | ORAL | 3 refills | Status: DC

## 2018-04-02 MED ORDER — MONTELUKAST SODIUM 10 MG PO TABS: 10.0000 mg | ORAL_TABLET | Freq: Every evening | ORAL | 1 refills | Status: DC | PRN

## 2018-04-02 NOTE — Telephone Encounter (Signed)
I am filling the ones' Dr. Wynetta Emery has filled in the past, but not controlled substances or those by a historical provider.

## 2018-04-02 NOTE — Telephone Encounter (Signed)
Attempted to call patient to discussed needed medications- voice mail is not set up. Call sent for provider review- not sure what medications she would want to extend. Rx refill request: medications prescribed by provider?  LOV: 08/16/17  PCP: [place]: verified

## 2018-04-02 NOTE — Telephone Encounter (Signed)
Copied from [place] [id number]. Topic: Quick Communication - See Telephone Encounter >> Apr 02, 2018  9:29 AM Ivar Drape wrote: CRM for notification. See Telephone encounter for: 04/02/18. The first available appointment for the patient to come into the office for Meds Refill visit is on 04/22/18.  She stated she will run out of her medication before that date.  She would like refills until her appt.  She doesn't know which medication she needs refills on.

## 2018-04-22 ENCOUNTER — Encounter: Payer: Self-pay | Admitting: Family Medicine

## 2018-04-22 ENCOUNTER — Ambulatory Visit (INDEPENDENT_AMBULATORY_CARE_PROVIDER_SITE_OTHER): Payer: Medicare Other | Admitting: Family Medicine

## 2018-04-22 ENCOUNTER — Other Ambulatory Visit: Payer: Self-pay

## 2018-04-22 VITALS — BP 216/81 | HR 60 | Temp 98.6°F | Ht 65.0 in | Wt 162.0 lb

## 2018-04-22 DIAGNOSIS — K219 Gastro-esophageal reflux disease without esophagitis: Secondary | ICD-10-CM | POA: Diagnosis not present

## 2018-04-22 DIAGNOSIS — N3000 Acute cystitis without hematuria: Secondary | ICD-10-CM

## 2018-04-22 DIAGNOSIS — R8281 Pyuria: Secondary | ICD-10-CM

## 2018-04-22 DIAGNOSIS — R7309 Other abnormal glucose: Secondary | ICD-10-CM | POA: Diagnosis not present

## 2018-04-22 DIAGNOSIS — N390 Urinary tract infection, site not specified: Secondary | ICD-10-CM | POA: Diagnosis not present

## 2018-04-22 DIAGNOSIS — R7301 Impaired fasting glucose: Secondary | ICD-10-CM | POA: Insufficient documentation

## 2018-04-22 DIAGNOSIS — L239 Allergic contact dermatitis, unspecified cause: Secondary | ICD-10-CM

## 2018-04-22 DIAGNOSIS — I10 Essential (primary) hypertension: Secondary | ICD-10-CM

## 2018-04-22 DIAGNOSIS — M47812 Spondylosis without myelopathy or radiculopathy, cervical region: Secondary | ICD-10-CM

## 2018-04-22 DIAGNOSIS — J4540 Moderate persistent asthma, uncomplicated: Secondary | ICD-10-CM | POA: Diagnosis not present

## 2018-04-22 DIAGNOSIS — E559 Vitamin D deficiency, unspecified: Secondary | ICD-10-CM | POA: Diagnosis not present

## 2018-04-22 DIAGNOSIS — F332 Major depressive disorder, recurrent severe without psychotic features: Secondary | ICD-10-CM

## 2018-04-22 DIAGNOSIS — F339 Major depressive disorder, recurrent, unspecified: Secondary | ICD-10-CM | POA: Diagnosis not present

## 2018-04-22 DIAGNOSIS — G894 Chronic pain syndrome: Secondary | ICD-10-CM

## 2018-04-22 DIAGNOSIS — M48061 Spinal stenosis, lumbar region without neurogenic claudication: Secondary | ICD-10-CM

## 2018-04-22 DIAGNOSIS — R270 Ataxia, unspecified: Secondary | ICD-10-CM

## 2018-04-22 DIAGNOSIS — F419 Anxiety disorder, unspecified: Secondary | ICD-10-CM

## 2018-04-22 DIAGNOSIS — M9983 Other biomechanical lesions of lumbar region: Secondary | ICD-10-CM

## 2018-04-22 LAB — UA/M W/RFLX CULTURE, ROUTINE
BILIRUBIN UA: NEGATIVE
Glucose, UA: NEGATIVE
Ketones, UA: NEGATIVE
NITRITE UA: NEGATIVE
RBC, UA: NEGATIVE
SPEC GRAV UA: 1.025 (ref 1.005–1.030)
Urobilinogen, Ur: 1 mg/dL (ref 0.2–1.0)
pH, UA: 5.5 (ref 5.0–7.5)

## 2018-04-22 LAB — MICROALBUMIN, URINE WAIVED
Creatinine, Urine Waived: 200 mg/dL (ref 10–300)
MICROALB, UR WAIVED: 80 mg/L — AB (ref 0–19)

## 2018-04-22 LAB — MICROSCOPIC EXAMINATION: WBC, UA: >30 /hpf — AB (ref 0–5)

## 2018-04-22 LAB — BAYER DCA HB A1C WAIVED: HB A1C (BAYER DCA - WAIVED): 6.4 % (ref <7.0)

## 2018-04-22 MED ORDER — TRIAMCINOLONE ACETONIDE 0.1 % EX CREA: TOPICAL_CREAM | Freq: Two times a day (BID) | CUTANEOUS | 3 refills | Status: DC

## 2018-04-22 MED ORDER — NITROFURANTOIN MONOHYD MACRO 100 MG PO CAPS: 100.0000 mg | ORAL_CAPSULE | Freq: Two times a day (BID) | ORAL | 0 refills | Status: DC

## 2018-04-22 MED ORDER — HYDRALAZINE HCL 100 MG PO TABS: 100.0000 mg | ORAL_TABLET | Freq: Three times a day (TID) | ORAL | 1 refills | Status: DC

## 2018-04-22 MED ORDER — TRAZODONE HCL 50 MG PO TABS: 25.0000 mg | ORAL_TABLET | Freq: Every evening | ORAL | 3 refills | Status: DC | PRN

## 2018-04-22 NOTE — Progress Notes (Signed)
BP (!) 216/81   Pulse 60   Temp 98.6 F (37 C) (Oral)   Ht 5\' 5"  (1.651 m)   Wt 162 lb (73.5 kg)   SpO2 98%   BMI 26.96 kg/m    Subjective:    Patient ID: Gloria Allen, female    DOB: 04-Jun-1946, 72 y.o.   MRN: [id number]  HPI: Gloria Allen is a 72 y.o. female  Chief Complaint  Patient presents with  . Hypertension    pt states she is concerned about 2 medications are making her sick but pt would not remember med names  . Medication Refill    trazodone  . Back Pain    pt states she needs something for back and legs pain  . Rash    right arm   Patient is a very poor historian. Has not been seen in 8 months. Has not been seeing another provider. Has been seeing her cardiologist.   Patient presents today for follow up after being lost to follow up for 8 months. She has been following with cardiology for CP and SOB and had a myoview that was concerning for inferior ischemia. She was scheduled for a cardiac catherization and no-showed for it. She was also supposed to have an MRI of her brain for ataxia. She also no-showed for it.   Started on chlorthalidone and got a rash on it, so she stopped it. Had swelling in her legs on amlodipine, so stopped it.   HYPERTENSION- has been taking her blood pressure medicine.  Hypertension status: uncontrolled  Satisfied with current treatment? no Duration of hypertension: chronic BP monitoring frequency:  not checking BP medication side effects:  yes Medication compliance: poor compliance Aspirin: unknown Recurrent headaches: yes Visual changes: yes Palpitations: no Dyspnea: yes Chest pain: yes Lower extremity edema: yes Dizzy/lightheaded: yes  HYPERLIPIDEMIA Hyperlipidemia status: poor compliance Satisfied with current treatment?  yes Side effects:  no Medication compliance: poor compliance Past cholesterol meds: lovastatin Supplements: none Aspirin:  no The ASCVD Risk score Mikey Bussing DC Jr., et al., 2013) failed to calculate  for the following reasons:   The valid systolic blood pressure range is 90 to 200 mmHg Chest pain:  no Coronary artery disease:  yes  PMP reviewed- has been getting Klonopin monthly from a provider in [place]. Call made to Olen Pel, NP- she was working with Dr. Mauricia Area prior to his retiring. She has not seen Izora Gala in several months. Has refills on the klonopin, but has not written new Rxs.   Relevant past medical, surgical, family and social history reviewed and updated as indicated. Interim medical history since our last visit reviewed. Allergies and medications reviewed and updated.  Review of Systems  Constitutional: Positive for fatigue and fever. Negative for activity change, appetite change, chills, diaphoresis and unexpected weight change.  HENT: Negative.   Eyes: Negative.   Respiratory: Negative.   Cardiovascular: Negative.   Musculoskeletal: Positive for back pain and myalgias. Negative for arthralgias, gait problem, joint swelling, neck pain and neck stiffness.  Skin: Negative.   Neurological: Negative.   Psychiatric/Behavioral: Positive for agitation and confusion. Negative for behavioral problems, decreased concentration, dysphoric mood, hallucinations, self-injury, sleep disturbance and suicidal ideas. The patient is not nervous/anxious and is not hyperactive.     Per HPI unless specifically indicated above     Objective:    BP (!) 216/81   Pulse 60   Temp 98.6 F (37 C) (Oral)   Ht 5\' 5"  (1.651 m)  Wt 162 lb (73.5 kg)   SpO2 98%   BMI 26.96 kg/m   Wt Readings from Last 3 Encounters:  04/22/18 162 lb (73.5 kg)  01/15/18 167 lb (75.8 kg)  08/16/17 169 lb (76.7 kg)    Physical Exam  Constitutional: She is oriented to person, place, and time. She appears well-developed and well-nourished. No distress.  HENT:  Head: Normocephalic and atraumatic.  Right Ear: Hearing normal.  Left Ear: Hearing normal.  Nose: Nose normal.  Eyes: Conjunctivae and lids are  normal. Right eye exhibits no discharge. Left eye exhibits no discharge. No scleral icterus.  Cardiovascular: Normal rate, regular rhythm, normal heart sounds and intact distal pulses. Exam reveals no gallop and no friction rub.  No murmur heard. Pulmonary/Chest: Effort normal and breath sounds normal. No stridor. No respiratory distress. She has no wheezes. She has no rales. She exhibits no tenderness.  Musculoskeletal: Normal range of motion.  Neurological: She is alert and oriented to person, place, and time.  Skin: Skin is warm, dry and intact. Capillary refill takes less than 2 seconds. No rash noted. She is not diaphoretic. No erythema. No pallor.  Psychiatric: She has a normal mood and affect. Her speech is normal and behavior is normal. Judgment and thought content normal. Cognition and memory are normal.  Nursing note and vitals reviewed.   Results for orders placed or performed in visit on 04/22/18  Urine Culture  Result Value Ref Range   Urine Culture, Routine Final report (A)    Organism ID, Bacteria Escherichia coli (A)    Antimicrobial Susceptibility Comment   Microscopic Examination  Result Value Ref Range   WBC, UA >30 (A) 0 - 5 /hpf   RBC, UA 0-2 0 - 2 /hpf   Epithelial Cells (non renal) 0-10 0 - 10 /hpf   Renal Epithel, UA 0-10 (A) None seen /hpf   Bacteria, UA Moderate (A) None seen/Few  Bayer DCA Hb A1c Waived  Result Value Ref Range   HB A1C (BAYER DCA - WAIVED) 6.4 <7.0 %  CBC with Differential/Platelet  Result Value Ref Range   WBC 10.1 3.4 - 10.8 x10E3/uL   RBC 5.14 3.77 - 5.28 x10E6/uL   Hemoglobin 13.4 11.1 - 15.9 g/dL   Hematocrit 42.9 34.0 - 46.6 %   MCV 84 79 - 97 fL   MCH 26.1 (L) 26.6 - 33.0 pg   MCHC 31.2 (L) 31.5 - 35.7 g/dL   RDW 13.5 12.3 - 15.4 %   Platelets 352 150 - 450 x10E3/uL   Neutrophils 71 Not Estab. %   Lymphs 22 Not Estab. %   Monocytes 4 Not Estab. %   Eos 2 Not Estab. %   Basos 1 Not Estab. %   Neutrophils Absolute 7.1 (H) 1.4  - 7.0 x10E3/uL   Lymphocytes Absolute 2.2 0.7 - 3.1 x10E3/uL   Monocytes Absolute 0.4 0.1 - 0.9 x10E3/uL   EOS (ABSOLUTE) 0.2 0.0 - 0.4 x10E3/uL   Basophils Absolute 0.1 0.0 - 0.2 x10E3/uL   Immature Granulocytes 0 Not Estab. %   Immature Grans (Abs) 0.0 0.0 - 0.1 x10E3/uL  Comprehensive metabolic panel  Result Value Ref Range   Glucose 135 (H) 65 - 99 mg/dL   BUN 11 8 - 27 mg/dL   Creatinine, Ser 0.94 0.57 - 1.00 mg/dL   GFR calc non Af Amer 62 >59 mL/min/1.73   GFR calc Af Amer 72 >59 mL/min/1.73   BUN/Creatinine Ratio 12 12 - 28   Sodium  143 134 - 144 mmol/L   Potassium 3.2 (L) 3.5 - 5.2 mmol/L   Chloride 97 96 - 106 mmol/L   CO2 29 20 - 29 mmol/L   Calcium 10.5 (H) 8.7 - 10.3 mg/dL   Total Protein 7.5 6.0 - 8.5 g/dL   Albumin 4.4 3.6 - 4.8 g/dL   Globulin, Total 3.1 1.5 - 4.5 g/dL   Albumin/Globulin Ratio 1.4 1.2 - 2.2   Bilirubin Total <0.2 0.0 - 1.2 mg/dL   Alkaline Phosphatase 149 (H) 39 - 117 IU/L   AST 21 0 - 40 IU/L   ALT 12 0 - 32 IU/L  Lipid Panel w/o Chol/HDL Ratio  Result Value Ref Range   Cholesterol, Total 159 100 - 199 mg/dL   Triglycerides 277 (H) 0 - 149 mg/dL   HDL 58 >39 mg/dL   VLDL Cholesterol Cal 55 (H) 5 - 40 mg/dL   LDL Calculated 46 0 - 99 mg/dL  Microalbumin, Urine Waived  Result Value Ref Range   Microalb, Ur Waived 80 (H) 0 - 19 mg/L   Creatinine, Urine Waived 200 10 - 300 mg/dL   Microalb/Creat Ratio 30-300 (H) <30 mg/g  TSH  Result Value Ref Range   TSH 0.865 0.450 - 4.500 uIU/mL  UA/M w/rflx Culture, Routine  Result Value Ref Range   Specific Gravity, UA 1.025 1.005 - 1.030   pH, UA 5.5 5.0 - 7.5   Color, UA Orange Yellow   Appearance Ur Turbid (A) Clear   Leukocytes, UA 1+ (A) Negative   Protein, UA 1+ (A) Negative/Trace   Glucose, UA Negative Negative   Ketones, UA Negative Negative   RBC, UA Negative Negative   Bilirubin, UA Negative Negative   Urobilinogen, Ur 1.0 0.2 - 1.0 mg/dL   Nitrite, UA Negative Negative    Microscopic Examination See below:   VITAMIN D 25 Hydroxy (Vit-D Deficiency, Fractures)  Result Value Ref Range   Vit D, 25-Hydroxy 24.9 (L) 30.0 - 100.0 ng/mL      Assessment & Plan:   Problem List Items Addressed This Visit      Respiratory   Bronchial asthma    Stable on current regimen. Continue current regimen. Continue to monitor.       Relevant Medications   SYMBICORT 160-4.5 MCG/ACT inhaler   Other Relevant Orders   Bayer DCA Hb A1c Waived (Completed)   CBC with Differential/Platelet (Completed)   Comprehensive metabolic panel (Completed)   Lipid Panel w/o Chol/HDL Ratio (Completed)   Microalbumin, Urine Waived (Completed)   TSH (Completed)   UA/M w/rflx Culture, Routine (Completed)   VITAMIN D 25 Hydroxy (Vit-D Deficiency, Fractures) (Completed)   Laryngopharyngeal reflux (LPR)    Unclear if she has been taking her medicine. Restart it and recheck in 1 week.       Relevant Orders   Bayer DCA Hb A1c Waived (Completed)   CBC with Differential/Platelet (Completed)   Comprehensive metabolic panel (Completed)   Lipid Panel w/o Chol/HDL Ratio (Completed)   Microalbumin, Urine Waived (Completed)   TSH (Completed)   UA/M w/rflx Culture, Routine (Completed)   VITAMIN D 25 Hydroxy (Vit-D Deficiency, Fractures) (Completed)     Digestive   GERD (gastroesophageal reflux disease)    Unclear if she has been taking her medicine. Restart it and recheck in 1 week.        Relevant Medications   omeprazole (PRILOSEC) 40 MG capsule     Endocrine   IFG (impaired fasting glucose)    A1c  6.4. Continue diet and exercise. Recheck 6 months.         Musculoskeletal and Integument   Lumbar foraminal stenosis (Bilateral: L4-5) (Left: L5-S1) (Chronic)    Discussed with patient again that we cannot treat her chronic pain. Offered referral to pain management again. She is not sure if she wants to do it or not. Will monitor.       Cervical arthritis    Discussed with patient again  that we cannot treat her chronic pain. Offered referral to pain management again. She is not sure if she wants to do it or not. Will monitor.         Genitourinary   Benign hypertensive renal disease - Primary    Under very poor control. Will increase her hydralazine to 100mg  and recheck 1 week. Call with any concerns.         Other   Chronic pain syndrome (Chronic)    Discussed with patient again that we cannot treat her chronic pain. Offered referral to pain management again. She is not sure if she wants to do it or not. Will monitor.       Relevant Orders   Bayer DCA Hb A1c Waived (Completed)   CBC with Differential/Platelet (Completed)   Comprehensive metabolic panel (Completed)   Lipid Panel w/o Chol/HDL Ratio (Completed)   Microalbumin, Urine Waived (Completed)   TSH (Completed)   UA/M w/rflx Culture, Routine (Completed)   VITAMIN D 25 Hydroxy (Vit-D Deficiency, Fractures) (Completed)   Anxiety    Has been getting klonpin from previous provider. We discussed that we will not be able to give her klonopin. She can follow up with psychiatry if she feels like she needs it. Discussion with previous provider, who notes that she has not seen her in several months as well.       Relevant Medications   hydrOXYzine (ATARAX/VISTARIL) 10 MG tablet   mirtazapine (REMERON) 15 MG tablet   sertraline (ZOLOFT) 100 MG tablet   traZODone (DESYREL) 50 MG tablet   Other Relevant Orders   Bayer DCA Hb A1c Waived (Completed)   CBC with Differential/Platelet (Completed)   Comprehensive metabolic panel (Completed)   Lipid Panel w/o Chol/HDL Ratio (Completed)   Microalbumin, Urine Waived (Completed)   TSH (Completed)   UA/M w/rflx Culture, Routine (Completed)   VITAMIN D 25 Hydroxy (Vit-D Deficiency, Fractures) (Completed)   Ambulatory referral to Psychiatry   Depression    Stable. Continue current regimen. Continue to monitor. Call with any concerns.      Relevant Medications    hydrOXYzine (ATARAX/VISTARIL) 10 MG tablet   mirtazapine (REMERON) 15 MG tablet   sertraline (ZOLOFT) 100 MG tablet   traZODone (DESYREL) 50 MG tablet   Other Relevant Orders   Bayer DCA Hb A1c Waived (Completed)   CBC with Differential/Platelet (Completed)   Comprehensive metabolic panel (Completed)   Lipid Panel w/o Chol/HDL Ratio (Completed)   Microalbumin, Urine Waived (Completed)   TSH (Completed)   UA/M w/rflx Culture, Routine (Completed)   VITAMIN D 25 Hydroxy (Vit-D Deficiency, Fractures) (Completed)   Ambulatory referral to Psychiatry   Ataxia   Relevant Orders   Bayer DCA Hb A1c Waived (Completed)   CBC with Differential/Platelet (Completed)   Comprehensive metabolic panel (Completed)   Lipid Panel w/o Chol/HDL Ratio (Completed)   Microalbumin, Urine Waived (Completed)   TSH (Completed)   UA/M w/rflx Culture, Routine (Completed)   VITAMIN D 25 Hydroxy (Vit-D Deficiency, Fractures) (Completed)    Other Visit Diagnoses  Pyuria       + UTI   Relevant Orders   Urine Culture (Completed)   Acute cystitis without hematuria       Will treat with nitrofurantoin. Call with any concerns.        Follow up plan: Return in about 1 week (around 04/29/2018) for follow up BP.   Greater than 1 hour spent in coordination of care and counseling today.

## 2018-04-23 ENCOUNTER — Telehealth: Payer: Self-pay | Admitting: Family Medicine

## 2018-04-23 LAB — COMPREHENSIVE METABOLIC PANEL
ALT: 12 IU/L (ref 0–32)
AST: 21 IU/L (ref 0–40)
Albumin/Globulin Ratio: 1.4 (ref 1.2–2.2)
Albumin: 4.4 g/dL (ref 3.6–4.8)
Alkaline Phosphatase: 149 IU/L — ABNORMAL HIGH (ref 39–117)
BUN/Creatinine Ratio: 12 (ref 12–28)
BUN: 11 mg/dL (ref 8–27)
Bilirubin Total: <0.2 mg/dL (ref 0.0–1.2)
CO2: 29 mmol/L (ref 20–29)
Calcium: 10.5 mg/dL — ABNORMAL HIGH (ref 8.7–10.3)
Chloride: 97 mmol/L (ref 96–106)
Creatinine, Ser: 0.94 mg/dL (ref 0.57–1.00)
GFR calc non Af Amer: 62 mL/min/{1.73_m2} (ref >59)
GFR, EST AFRICAN AMERICAN: 72 mL/min/{1.73_m2} (ref >59)
Globulin, Total: 3.1 g/dL (ref 1.5–4.5)
Glucose: 135 mg/dL — ABNORMAL HIGH (ref 65–99)
POTASSIUM: 3.2 mmol/L — AB (ref 3.5–5.2)
Sodium: 143 mmol/L (ref 134–144)
Total Protein: 7.5 g/dL (ref 6.0–8.5)

## 2018-04-23 LAB — CBC WITH DIFFERENTIAL/PLATELET
Basophils Absolute: 0.1 10*3/uL (ref 0.0–0.2)
Basos: 1 %
EOS (ABSOLUTE): 0.2 10*3/uL (ref 0.0–0.4)
EOS: 2 %
HEMATOCRIT: 42.9 % (ref 34.0–46.6)
Hemoglobin: 13.4 g/dL (ref 11.1–15.9)
IMMATURE GRANULOCYTES: 0 %
Immature Grans (Abs): 0 10*3/uL (ref 0.0–0.1)
LYMPHS: 22 %
Lymphocytes Absolute: 2.2 10*3/uL (ref 0.7–3.1)
MCH: 26.1 pg — ABNORMAL LOW (ref 26.6–33.0)
MCHC: 31.2 g/dL — ABNORMAL LOW (ref 31.5–35.7)
MCV: 84 fL (ref 79–97)
Monocytes Absolute: 0.4 10*3/uL (ref 0.1–0.9)
Monocytes: 4 %
NEUTROS PCT: 71 %
Neutrophils Absolute: 7.1 10*3/uL — ABNORMAL HIGH (ref 1.4–7.0)
Platelets: 352 10*3/uL (ref 150–450)
RBC: 5.14 x10E6/uL (ref 3.77–5.28)
RDW: 13.5 % (ref 12.3–15.4)
WBC: 10.1 10*3/uL (ref 3.4–10.8)

## 2018-04-23 LAB — LIPID PANEL W/O CHOL/HDL RATIO
Cholesterol, Total: 159 mg/dL (ref 100–199)
HDL: 58 mg/dL (ref >39)
LDL CALC: 46 mg/dL (ref 0–99)
Triglycerides: 277 mg/dL — ABNORMAL HIGH (ref 0–149)
VLDL CHOLESTEROL CAL: 55 mg/dL — AB (ref 5–40)

## 2018-04-23 LAB — TSH: TSH: 0.865 u[IU]/mL (ref 0.450–4.500)

## 2018-04-23 LAB — VITAMIN D 25 HYDROXY (VIT D DEFICIENCY, FRACTURES): Vit D, 25-Hydroxy: 24.9 ng/mL — ABNORMAL LOW (ref 30.0–100.0)

## 2018-04-23 NOTE — Telephone Encounter (Signed)
Please let her know that her labs came back normal. Thanks!

## 2018-04-24 ENCOUNTER — Other Ambulatory Visit: Payer: Self-pay

## 2018-04-24 ENCOUNTER — Encounter: Payer: Self-pay | Admitting: Family Medicine

## 2018-04-24 ENCOUNTER — Other Ambulatory Visit: Payer: Self-pay | Admitting: Family Medicine

## 2018-04-24 LAB — URINE CULTURE

## 2018-04-24 MED ORDER — METOPROLOL SUCCINATE ER 100 MG PO TB24: 100.0000 mg | ORAL_TABLET | Freq: Every day | ORAL | 0 refills | Status: DC

## 2018-04-24 MED ORDER — TERAZOSIN HCL 5 MG PO CAPS: 5.0000 mg | ORAL_CAPSULE | Freq: Every day | ORAL | 1 refills | Status: DC

## 2018-04-24 MED ORDER — HYDROXYZINE HCL 10 MG PO TABS: 10.0000 mg | ORAL_TABLET | Freq: Three times a day (TID) | ORAL | 3 refills | Status: DC | PRN

## 2018-04-24 MED ORDER — LOVASTATIN 40 MG PO TABS: 40.0000 mg | ORAL_TABLET | Freq: Every day | ORAL | 1 refills | Status: DC

## 2018-04-24 MED ORDER — SYMBICORT 160-4.5 MCG/ACT IN AERO: 2.0000 | INHALATION_SPRAY | Freq: Two times a day (BID) | RESPIRATORY_TRACT | 1 refills | Status: DC

## 2018-04-24 MED ORDER — TRAZODONE HCL 50 MG PO TABS: 25.0000 mg | ORAL_TABLET | Freq: Every evening | ORAL | 3 refills | Status: DC | PRN

## 2018-04-24 MED ORDER — MIRTAZAPINE 15 MG PO TABS: ORAL_TABLET | ORAL | 0 refills | Status: DC

## 2018-04-24 MED ORDER — CLONIDINE HCL 0.3 MG PO TABS: 0.3000 mg | ORAL_TABLET | Freq: Three times a day (TID) | ORAL | 0 refills | Status: DC

## 2018-04-24 MED ORDER — ARIPIPRAZOLE 5 MG PO TABS: 5.0000 mg | ORAL_TABLET | Freq: Every day | ORAL | 3 refills | Status: DC

## 2018-04-24 MED ORDER — OMEPRAZOLE 40 MG PO CPDR: 40.0000 mg | DELAYED_RELEASE_CAPSULE | Freq: Two times a day (BID) | ORAL | 2 refills | Status: DC

## 2018-04-24 MED ORDER — SERTRALINE HCL 100 MG PO TABS: 200.0000 mg | ORAL_TABLET | Freq: Every day | ORAL | 3 refills | Status: DC

## 2018-04-24 NOTE — Assessment & Plan Note (Signed)
Discussed with patient again that we cannot treat her chronic pain. Offered referral to pain management again. She is not sure if she wants to do it or not. Will monitor.

## 2018-04-24 NOTE — Telephone Encounter (Signed)
Patient notified

## 2018-04-24 NOTE — Assessment & Plan Note (Signed)
Unclear if she has been taking her medicine. Restart it and recheck in 1 week.

## 2018-04-24 NOTE — Assessment & Plan Note (Signed)
Stable on current regimen. Continue current regimen. Continue to monitor.

## 2018-04-24 NOTE — Assessment & Plan Note (Signed)
A1c 6.4. Continue diet and exercise. Recheck 6 months.

## 2018-04-24 NOTE — Assessment & Plan Note (Addendum)
Unclear if she has been taking her medicine. Restart it and recheck in 1 week.

## 2018-04-24 NOTE — Patient Outreach (Signed)
[facility]) Care Management  04/24/2018  Gloria Allen 10-09-1945 [id number]   Medication Adherence call to Gloria Allen patient did not answer. [facility] said patient pick up Lovastatin 40 mg on 04/24/18. Gloria Allen is showing past due  Under [place].   [facility] Direct Dial [phone]  Fax [phone] [email or web address]

## 2018-04-24 NOTE — Assessment & Plan Note (Signed)
Stable. Continue current regimen. Continue to monitor. Call with any concerns.  

## 2018-04-24 NOTE — Assessment & Plan Note (Signed)
Under very poor control. Will increase her hydralazine to 100mg  and recheck 1 week. Call with any concerns.

## 2018-04-24 NOTE — Assessment & Plan Note (Signed)
Has been getting klonpin from previous provider. We discussed that we will not be able to give her klonopin. She can follow up with psychiatry if she feels like she needs it. Discussion with previous provider, who notes that she has not seen her in several months as well.

## 2018-04-25 ENCOUNTER — Other Ambulatory Visit: Payer: Self-pay | Admitting: Family Medicine

## 2018-05-01 ENCOUNTER — Ambulatory Visit: Payer: Medicare Other | Admitting: Family Medicine

## 2018-05-05 ENCOUNTER — Encounter: Payer: Self-pay | Admitting: Family Medicine

## 2018-05-05 ENCOUNTER — Ambulatory Visit (INDEPENDENT_AMBULATORY_CARE_PROVIDER_SITE_OTHER): Payer: Medicare Other | Admitting: Family Medicine

## 2018-05-05 VITALS — BP 170/80 | HR 64 | Temp 98.4°F | Ht 65.0 in | Wt 172.1 lb

## 2018-05-05 DIAGNOSIS — R600 Localized edema: Secondary | ICD-10-CM | POA: Diagnosis not present

## 2018-05-05 DIAGNOSIS — M79605 Pain in left leg: Secondary | ICD-10-CM

## 2018-05-05 DIAGNOSIS — R42 Dizziness and giddiness: Secondary | ICD-10-CM

## 2018-05-05 DIAGNOSIS — F419 Anxiety disorder, unspecified: Secondary | ICD-10-CM

## 2018-05-05 DIAGNOSIS — F332 Major depressive disorder, recurrent severe without psychotic features: Secondary | ICD-10-CM

## 2018-05-05 DIAGNOSIS — M79604 Pain in right leg: Secondary | ICD-10-CM

## 2018-05-05 DIAGNOSIS — I129 Hypertensive chronic kidney disease with stage 1 through stage 4 chronic kidney disease, or unspecified chronic kidney disease: Secondary | ICD-10-CM

## 2018-05-05 MED ORDER — GABAPENTIN 300 MG PO CAPS: ORAL_CAPSULE | ORAL | 0 refills | Status: DC

## 2018-05-05 MED ORDER — MECLIZINE HCL 12.5 MG PO TABS: 12.5000 mg | ORAL_TABLET | Freq: Three times a day (TID) | ORAL | 0 refills | Status: DC | PRN

## 2018-05-05 MED ORDER — ARIPIPRAZOLE 10 MG PO TABS: 10.0000 mg | ORAL_TABLET | Freq: Every day | ORAL | 1 refills | Status: DC

## 2018-05-05 MED ORDER — FLUTICASONE FUROATE-VILANTEROL 200-25 MCG/INH IN AEPB: 1.0000 | INHALATION_SPRAY | Freq: Every day | RESPIRATORY_TRACT | 6 refills | Status: DC

## 2018-05-05 NOTE — Patient Instructions (Addendum)
INCREASE ABILIFY TO 10 MG DAILY  START MECLIZINE AS NEEDED FOR DIZZINESS  THE REFERRAL FOLKS WILL BE IN TOUCH ABOUT A PSYCHIATRY APPOINTMENT  AVOID SALT IN YOUR DIET AS MUCH AS POSSIBLE, INCLUDING BEVERAGES  COMPRESSION STOCKINGS EVERY DAY  HOLD MELOXICAM FOR NOW DUE TO BLOOD PRESSURES  START GABAPENTIN FOR LEG PAINS

## 2018-05-05 NOTE — Progress Notes (Signed)
BP (!) 170/80   Pulse 64   Temp 98.4 F (36.9 C) (Oral)   Ht 5\' 5"  (1.651 m)   Wt 172 lb 1.6 oz (78.1 kg)   SpO2 100%   BMI 28.64 kg/m    Subjective:    Patient ID: Gloria Allen, female    DOB: 1946-09-23, 72 y.o.   MRN: [id number]  HPI: Gloria Allen is a 72 y.o. female  Chief Complaint  Patient presents with  . Hypertension  . Depression  . Medication Management    pt states she is unsure of what medications she is supposed to be taking   Pt here today for BP follow up as well as some anxiety concerns. She also states she has no idea what's what with her medications and needs help reconciling them and learning what she needs and doesn't need.   Unsure if she's taking the correct medications for her BP or what she's currently taking in general. Readings still quite high. Pt denies visual changes, headaches, syncope.   States her anxiety is worse than ever and she really needs a refill on her klonopin. Currently on trazodone, zoloft, remeron, and abilify for moods/anxiety/sleep but states it's all poorly controlled. Unsure if she's taking them all daily. Had klonopin from a previous provider which worked well for her. Has been told by her PCP Dr. Wynetta Emery that she would need to see Psychiatry but pt declined referral in the past.   Having recurring dizzy spells that have been identified in the past as vertigo episodes. Did well on meclizine in the past. No syncope or recent falls.   Continues to have some mild LE swelling in both legs. Not painful. Does not try anything OTC for this. No orthopnea, DOE, CP.   Having continuous discomfort from her arthritis. Takes meloxicam daily with some relief.   Depression screen [facility] 2/9 05/05/2018 04/22/2018 08/05/2017  Decreased Interest 3 3 0  Down, Depressed, Hopeless 3 3 2   PHQ - 2 Score 6 6 2   Altered sleeping - 3 3  Tired, decreased energy 3 2 3   Change in appetite 2 3 2   Feeling bad or failure about yourself  3 2 0  Trouble  concentrating 3 1 3   Moving slowly or fidgety/restless 3 2 2   Suicidal thoughts 1 0 0  PHQ-9 Score - 19 15  Difficult doing work/chores - Very difficult -   GAD 7 : Generalized Anxiety Score 05/05/2018 04/22/2018 08/05/2017  Nervous, Anxious, on Edge 3 3 3   Control/stop worrying 3 3 2   Worry too much - different things 3 3 3   Trouble relaxing 2 3 2   Restless 3 2 2   Easily annoyed or irritable 2 3 1   Afraid - awful might happen 3 3 3   Total GAD 7 Score 19 20 16   Anxiety Difficulty Somewhat difficult Somewhat difficult Somewhat difficult     Past Medical History:  Diagnosis Date  . Anxiety   . Arthritis   . Cervical central spinal stenosis (C3-C7) (worse at C4-5) 07/30/2017  . Cervical foraminal stenosis (C4-5 and C5-6) (Bilateral) (L>R) 07/30/2017  . Chronic lower extremity pain (Fourth Area of Pain) (Bilateral) (R>L) 06/11/2017  . Chronic neck pain (Primary Area of Pain) (Bilateral) (R>L) 06/11/2017  . Chronic sacroiliac joint pain (Bilateral) (R>L) 06/11/2017  . Chronic shoulder pain [facility] Area of Pain) (Right) 06/11/2017  . Chronic upper extremity pain (Fifth Area of Pain) (Bilateral) (R>L) 06/11/2017  . DDD (degenerative disc disease), cervical 07/30/2017  .  DDD (degenerative disc disease), lumbar 07/30/2017  . Depression   . DISH (diffuse idiopathic skeletal hyperostosis) 07/30/2017  . Dyspnea    with exertion  . Entrapment syndrome 06/20/2017   2002 on R and 2010 on L  . Full thickness rotator cuff tear 06/12/2017  . GERD (gastroesophageal reflux disease)   . Grade 1 Anterolisthesis of L4 over L5 07/30/2017  . Headache   . Hx: UTI (urinary tract infection)   . Hypertension   . Inflammation of joint of shoulder region 01/15/2017  . Lumbar central spinal stenosis (L4-5) 07/30/2017  . Lumbar disc protrusion (Left: L5-S1) (Right: L1-2) 07/30/2017   L5-S1 left foraminal protrusion with L5 impingement. L1-2 right paracentral protrusion without impingement.  . Lumbar facet  arthropathy (Bilateral) 07/30/2017  . Lumbar facet syndrome (Bilateral) (R>L) 07/30/2017  . Osteoarthritis of shoulder (Right) 01/15/2017   Social History   Socioeconomic History  . Marital status: Divorced    Spouse name: Not on file  . Number of children: Not on file  . Years of education: Not on file  . Highest education level: Not on file  Occupational History  . Not on file  Social Needs  . Financial resource strain: Not on file  . Food insecurity:    Worry: Not on file    Inability: Not on file  . Transportation needs:    Medical: Not on file    Non-medical: Not on file  Tobacco Use  . Smoking status: Former Smoker    Packs/day: 1.00    Types: Cigarettes  . Smokeless tobacco: Never Used  . Tobacco comment: "quit years ago"  Substance and Sexual Activity  . Alcohol use: No  . Drug use: No  . Sexual activity: Never  Lifestyle  . Physical activity:    Days per week: Not on file    Minutes per session: Not on file  . Stress: Not on file  Relationships  . Social connections:    Talks on phone: Not on file    Gets together: Not on file    Attends religious service: Not on file    Active member of club or organization: Not on file    Attends meetings of clubs or organizations: Not on file    Relationship status: Not on file  . Intimate partner violence:    Fear of current or ex partner: Not on file    Emotionally abused: Not on file    Physically abused: Not on file    Forced sexual activity: Not on file  Other Topics Concern  . Not on file  Social History Narrative  . Not on file    Relevant past medical, surgical, family and social history reviewed and updated as indicated. Interim medical history since our last visit reviewed. Allergies and medications reviewed and updated.  Review of Systems  Per HPI unless specifically indicated above     Objective:    BP (!) 170/80   Pulse 64   Temp 98.4 F (36.9 C) (Oral)   Ht 5\' 5"  (1.651 m)   Wt 172 lb 1.6  oz (78.1 kg)   SpO2 100%   BMI 28.64 kg/m   Wt Readings from Last 3 Encounters:  05/05/18 172 lb 1.6 oz (78.1 kg)  04/22/18 162 lb (73.5 kg)  01/15/18 167 lb (75.8 kg)    Physical Exam  Constitutional: She appears well-developed and well-nourished. No distress.  HENT:  Head: Atraumatic.  Eyes: Conjunctivae and EOM are normal.  Neck: Normal range  of motion. Neck supple.  Cardiovascular: Normal rate and regular rhythm.  Pulmonary/Chest: Effort normal and breath sounds normal.  Musculoskeletal: Normal range of motion. She exhibits edema (trace edema, symmetric b/l). She exhibits no tenderness.  Neurological: She is alert.  Skin: Skin is warm and dry.  Psychiatric: She has a normal mood and affect.  Nursing note and vitals reviewed.   Results for orders placed or performed in visit on 04/22/18  Urine Culture  Result Value Ref Range   Urine Culture, Routine Final report (A)    Organism ID, Bacteria Escherichia coli (A)    Antimicrobial Susceptibility Comment   Microscopic Examination  Result Value Ref Range   WBC, UA >30 (A) 0 - 5 /hpf   RBC, UA 0-2 0 - 2 /hpf   Epithelial Cells (non renal) 0-10 0 - 10 /hpf   Renal Epithel, UA 0-10 (A) None seen /hpf   Bacteria, UA Moderate (A) None seen/Few  Bayer DCA Hb A1c Waived  Result Value Ref Range   HB A1C (BAYER DCA - WAIVED) 6.4 <7.0 %  CBC with Differential/Platelet  Result Value Ref Range   WBC 10.1 3.4 - 10.8 x10E3/uL   RBC 5.14 3.77 - 5.28 x10E6/uL   Hemoglobin 13.4 11.1 - 15.9 g/dL   Hematocrit 42.9 34.0 - 46.6 %   MCV 84 79 - 97 fL   MCH 26.1 (L) 26.6 - 33.0 pg   MCHC 31.2 (L) 31.5 - 35.7 g/dL   RDW 13.5 12.3 - 15.4 %   Platelets 352 150 - 450 x10E3/uL   Neutrophils 71 Not Estab. %   Lymphs 22 Not Estab. %   Monocytes 4 Not Estab. %   Eos 2 Not Estab. %   Basos 1 Not Estab. %   Neutrophils Absolute 7.1 (H) 1.4 - 7.0 x10E3/uL   Lymphocytes Absolute 2.2 0.7 - 3.1 x10E3/uL   Monocytes Absolute 0.4 0.1 - 0.9 x10E3/uL     EOS (ABSOLUTE) 0.2 0.0 - 0.4 x10E3/uL   Basophils Absolute 0.1 0.0 - 0.2 x10E3/uL   Immature Granulocytes 0 Not Estab. %   Immature Grans (Abs) 0.0 0.0 - 0.1 x10E3/uL  Comprehensive metabolic panel  Result Value Ref Range   Glucose 135 (H) 65 - 99 mg/dL   BUN 11 8 - 27 mg/dL   Creatinine, Ser 0.94 0.57 - 1.00 mg/dL   GFR calc non Af Amer 62 >59 mL/min/1.73   GFR calc Af Amer 72 >59 mL/min/1.73   BUN/Creatinine Ratio 12 12 - 28   Sodium 143 134 - 144 mmol/L   Potassium 3.2 (L) 3.5 - 5.2 mmol/L   Chloride 97 96 - 106 mmol/L   CO2 29 20 - 29 mmol/L   Calcium 10.5 (H) 8.7 - 10.3 mg/dL   Total Protein 7.5 6.0 - 8.5 g/dL   Albumin 4.4 3.6 - 4.8 g/dL   Globulin, Total 3.1 1.5 - 4.5 g/dL   Albumin/Globulin Ratio 1.4 1.2 - 2.2   Bilirubin Total <0.2 0.0 - 1.2 mg/dL   Alkaline Phosphatase 149 (H) 39 - 117 IU/L   AST 21 0 - 40 IU/L   ALT 12 0 - 32 IU/L  Lipid Panel w/o Chol/HDL Ratio  Result Value Ref Range   Cholesterol, Total 159 100 - 199 mg/dL   Triglycerides 277 (H) 0 - 149 mg/dL   HDL 58 >39 mg/dL   VLDL Cholesterol Cal 55 (H) 5 - 40 mg/dL   LDL Calculated 46 0 - 99 mg/dL  Microalbumin, Urine  Waived  Result Value Ref Range   Microalb, Ur Waived 80 (H) 0 - 19 mg/L   Creatinine, Urine Waived 200 10 - 300 mg/dL   Microalb/Creat Ratio 30-300 (H) <30 mg/g  TSH  Result Value Ref Range   TSH 0.865 0.450 - 4.500 uIU/mL  UA/M w/rflx Culture, Routine  Result Value Ref Range   Specific Gravity, UA 1.025 1.005 - 1.030   pH, UA 5.5 5.0 - 7.5   Color, UA Orange Yellow   Appearance Ur Turbid (A) Clear   Leukocytes, UA 1+ (A) Negative   Protein, UA 1+ (A) Negative/Trace   Glucose, UA Negative Negative   Ketones, UA Negative Negative   RBC, UA Negative Negative   Bilirubin, UA Negative Negative   Urobilinogen, Ur 1.0 0.2 - 1.0 mg/dL   Nitrite, UA Negative Negative   Microscopic Examination See below:   VITAMIN D 25 Hydroxy (Vit-D Deficiency, Fractures)  Result Value Ref Range    Vit D, 25-Hydroxy 24.9 (L) 30.0 - 100.0 ng/mL      Assessment & Plan:   Problem List Items Addressed This Visit      Genitourinary   Benign hypertensive renal disease    Potential compliance barrier causing continued uncontrolled BPs. More than 25 min spent today marking bottles and educating about what her medicines are for and how/when to take them. Pt acknowleges understanding about what her BP medications are at this time and will work on taking appropriately. DASH diet, exercise. Will also d/c meloxicam for now until under better control. Recheck in 2 weeks.         Other   Anxiety - Primary    Pt agreeable to psychiatry referral today for management of her uncontrolled anxiety. Discussed per PCP that we will not manage a BZD for her. Continue current regimen.       Relevant Orders   Ambulatory referral to Psychiatry   Depression    Not under good control. Will increase abilify to 10 mg and continue other medications. Psychiatry referral pending for further management.        Other Visit Diagnoses    Dizziness       Probable vertigo, will restart meclizine and monitor closely for benefit. Unclear if significantly elevated BPs are playing a role at this time. Work on control   Leg edema       Hx of allergy to loop diuretic, will hold off trying another type. Salt restriction, compression hose, elevation, increased exercise recommended   Pain in both lower extremities       Temporary hold of meloxicam until BPs improve. Will start gabapentin and monitor closely for relief. Sedation precautions reviewed    Greater than 1 hour spent today in person counseling patient on medication education, compliance, and complaints.    Follow up plan: Return in about 2 weeks (around 05/19/2018) for BP, leg pains, mood f/u.

## 2018-05-15 NOTE — Assessment & Plan Note (Signed)
Not under good control. Will increase abilify to 10 mg and continue other medications. Psychiatry referral pending for further management.

## 2018-05-15 NOTE — Assessment & Plan Note (Signed)
Pt agreeable to psychiatry referral today for management of her uncontrolled anxiety. Discussed per PCP that we will not manage a BZD for her. Continue current regimen.

## 2018-05-15 NOTE — Assessment & Plan Note (Addendum)
Potential compliance barrier causing continued uncontrolled BPs. More than 25 min spent today marking bottles and educating about what her medicines are for and how/when to take them. Pt acknowleges understanding about what her BP medications are at this time and will work on taking appropriately. DASH diet, exercise. Will also d/c meloxicam for now until under better control. Recheck in 2 weeks.

## 2018-05-19 ENCOUNTER — Encounter: Payer: Self-pay | Admitting: Family Medicine

## 2018-05-19 ENCOUNTER — Ambulatory Visit (INDEPENDENT_AMBULATORY_CARE_PROVIDER_SITE_OTHER): Payer: Medicare Other | Admitting: Family Medicine

## 2018-05-19 ENCOUNTER — Other Ambulatory Visit: Payer: Self-pay

## 2018-05-19 VITALS — BP 213/81 | HR 52 | Temp 98.1°F | Ht 65.0 in | Wt 170.1 lb

## 2018-05-19 DIAGNOSIS — Z77120 Contact with and (suspected) exposure to mold (toxic): Secondary | ICD-10-CM | POA: Diagnosis not present

## 2018-05-19 DIAGNOSIS — F419 Anxiety disorder, unspecified: Secondary | ICD-10-CM

## 2018-05-19 DIAGNOSIS — R300 Dysuria: Secondary | ICD-10-CM | POA: Diagnosis not present

## 2018-05-19 DIAGNOSIS — I129 Hypertensive chronic kidney disease with stage 1 through stage 4 chronic kidney disease, or unspecified chronic kidney disease: Secondary | ICD-10-CM

## 2018-05-19 LAB — UA/M W/RFLX CULTURE, ROUTINE
Bilirubin, UA: NEGATIVE
GLUCOSE, UA: NEGATIVE
Ketones, UA: NEGATIVE
LEUKOCYTES UA: NEGATIVE
Nitrite, UA: NEGATIVE
PROTEIN UA: NEGATIVE
RBC, UA: NEGATIVE
SPEC GRAV UA: 1.015 (ref 1.005–1.030)
Urobilinogen, Ur: 0.2 mg/dL (ref 0.2–1.0)
pH, UA: 5.5 (ref 5.0–7.5)

## 2018-05-19 MED ORDER — METHOCARBAMOL 500 MG PO TABS: 500.0000 mg | ORAL_TABLET | Freq: Every evening | ORAL | 0 refills | Status: DC | PRN

## 2018-05-19 MED ORDER — CLONIDINE HCL 0.3 MG PO TABS: 0.6000 mg | ORAL_TABLET | Freq: Two times a day (BID) | ORAL | 2 refills | Status: DC

## 2018-05-19 NOTE — Patient Instructions (Signed)
To reschedule your MRI brain - [facility] [phone]    [place] to schedule mammogram [phone]

## 2018-05-19 NOTE — Progress Notes (Signed)
BP (!) 213/81 (BP Location: Left Arm, Cuff Size: Large)   Pulse (!) 52   Temp 98.1 F (36.7 C) (Oral)   Ht 5\' 5"  (1.651 m)   Wt 170 lb 1.6 oz (77.2 kg)   SpO2 100%   BMI 28.31 kg/m    Subjective:    Patient ID: [name], female    DOB: [birth year], 72 y.o.   MRN: [id number]  HPI: NYESHIA MYSLIWIEC is a 72 y.o. female  Chief Complaint  Patient presents with  . Mold exposure  . Hypertension   Here today to discuss ongoing chest tightness and productive cough since living in a home recently that she suspects mold exposure from. Has been referred to Allergist in the past year but declined the appointment. Denies fevers, chills, SOB. Taking singulair and breo inhaler.   BPs still significantly elevated. Questionable medication compliance. Does have HAs chronically with occasional dizziness, but no visual changes. Missed an MRI brain that was scheduled recently, plans to reschedule.   Still feeling like her anxiety is poorly controlled and wanting to get back on klonopin. Abilify was increased two weeks ago, has not noticed much of a difference there.  Past Medical History:  Diagnosis Date  . Anxiety   . Arthritis   . Cervical central spinal stenosis (C3-C7) (worse at C4-5) 07/30/2017  . Cervical foraminal stenosis (C4-5 and C5-6) (Bilateral) (L>R) 07/30/2017  . Chronic lower extremity pain (Fourth Area of Pain) (Bilateral) (R>L) 06/11/2017  . Chronic neck pain (Primary Area of Pain) (Bilateral) (R>L) 06/11/2017  . Chronic sacroiliac joint pain (Bilateral) (R>L) 06/11/2017  . Chronic shoulder pain [facility] Area of Pain) (Right) 06/11/2017  . Chronic upper extremity pain (Fifth Area of Pain) (Bilateral) (R>L) 06/11/2017  . DDD (degenerative disc disease), cervical 07/30/2017  . DDD (degenerative disc disease), lumbar 07/30/2017  . Depression   . DISH (diffuse idiopathic skeletal hyperostosis) 07/30/2017  . Dyspnea    with exertion  . Entrapment syndrome 06/20/2017   2002 on R and  2010 on L  . Full thickness rotator cuff tear 06/12/2017  . GERD (gastroesophageal reflux disease)   . Grade 1 Anterolisthesis of L4 over L5 07/30/2017  . Headache   . Hx: UTI (urinary tract infection)   . Hypertension   . Inflammation of joint of shoulder region 01/15/2017  . Lumbar central spinal stenosis (L4-5) 07/30/2017  . Lumbar disc protrusion (Left: L5-S1) (Right: L1-2) 07/30/2017   L5-S1 left foraminal protrusion with L5 impingement. L1-2 right paracentral protrusion without impingement.  . Lumbar facet arthropathy (Bilateral) 07/30/2017  . Lumbar facet syndrome (Bilateral) (R>L) 07/30/2017  . Osteoarthritis of shoulder (Right) 01/15/2017   Social History   Socioeconomic History  . Marital status: Divorced    Spouse name: Not on file  . Number of children: Not on file  . Years of education: Not on file  . Highest education level: Not on file  Occupational History  . Not on file  Social Needs  . Financial resource strain: Not on file  . Food insecurity:    Worry: Not on file    Inability: Not on file  . Transportation needs:    Medical: Not on file    Non-medical: Not on file  Tobacco Use  . Smoking status: Former Smoker    Packs/day: 1.00    Types: Cigarettes  . Smokeless tobacco: Never Used  . Tobacco comment: "quit years ago"  Substance and Sexual Activity  . Alcohol use: No  .  Drug use: No  . Sexual activity: Never  Lifestyle  . Physical activity:    Days per week: Not on file    Minutes per session: Not on file  . Stress: Not on file  Relationships  . Social connections:    Talks on phone: Not on file    Gets together: Not on file    Attends religious service: Not on file    Active member of club or organization: Not on file    Attends meetings of clubs or organizations: Not on file    Relationship status: Not on file  . Intimate partner violence:    Fear of current or ex partner: Not on file    Emotionally abused: Not on file    Physically abused:  Not on file    Forced sexual activity: Not on file  Other Topics Concern  . Not on file  Social History Narrative  . Not on file    Relevant past medical, surgical, family and social history reviewed and updated as indicated. Interim medical history since our last visit reviewed. Allergies and medications reviewed and updated.  Review of Systems  Per HPI unless specifically indicated above     Objective:    BP (!) 213/81 (BP Location: Left Arm, Cuff Size: Large)   Pulse (!) 52   Temp 98.1 F (36.7 C) (Oral)   Ht 5\' 5"  (1.651 m)   Wt 170 lb 1.6 oz (77.2 kg)   SpO2 100%   BMI 28.31 kg/m   Wt Readings from Last 3 Encounters:  05/19/18 170 lb 1.6 oz (77.2 kg)  05/05/18 172 lb 1.6 oz (78.1 kg)  04/22/18 162 lb (73.5 kg)    Physical Exam  Constitutional: She is oriented to person, place, and time. She appears well-developed and well-nourished. No distress.  HENT:  Head: Atraumatic.  Eyes: Conjunctivae and EOM are normal.  Neck: Normal range of motion. Neck supple.  Cardiovascular: Normal rate and regular rhythm.  Pulmonary/Chest: Effort normal and breath sounds normal. No respiratory distress. She has no wheezes.  Musculoskeletal: Normal range of motion.  Neurological: She is alert and oriented to person, place, and time.  Skin: Skin is warm and dry.  Psychiatric: She has a normal mood and affect. Her behavior is normal.  Nursing note and vitals reviewed.   Results for orders placed or performed in visit on 05/19/18  UA/M w/rflx Culture, Routine  Result Value Ref Range   Specific Gravity, UA 1.015 1.005 - 1.030   pH, UA 5.5 5.0 - 7.5   Color, UA Yellow Yellow   Appearance Ur Hazy (A) Clear   Leukocytes, UA Negative Negative   Protein, UA Negative Negative/Trace   Glucose, UA Negative Negative   Ketones, UA Negative Negative   RBC, UA Negative Negative   Bilirubin, UA Negative Negative   Urobilinogen, Ur 0.2 0.2 - 1.0 mg/dL   Nitrite, UA Negative Negative        Assessment & Plan:   Problem List Items Addressed This Visit      Genitourinary   Benign hypertensive renal disease    Still quite elevated, reviewed her medication regimen again and discussed importance of compliance. Will increase clonidine to 0.6 mg BID and continue other medications as before. DASH diet, exercise. Follow up in 2 weeks        Other   Anxiety    Psychiatry referral pending to discuss restarting klonopin. Continue current regimen otherwise       Other  Visit Diagnoses    Mold exposure    -  Primary   Allergist referral re-placed. Continue singulair and breo. Exam benign today. May add flonase BID and zyrtec.    Relevant Orders   Ambulatory referral to ENT   Dysuria       Noted toward end of visit. U/A WNL, benign exam. Push fluids, cranberry tablets. F/u if worsening or not improving   Relevant Orders   UA/M w/rflx Culture, Routine (Completed)       Follow up plan: Return in about 2 weeks (around 06/02/2018) for BP with Dr. Wynetta Emery.

## 2018-05-21 NOTE — Assessment & Plan Note (Signed)
Psychiatry referral pending to discuss restarting klonopin. Continue current regimen otherwise

## 2018-05-21 NOTE — Assessment & Plan Note (Signed)
Still quite elevated, reviewed her medication regimen again and discussed importance of compliance. Will increase clonidine to 0.6 mg BID and continue other medications as before. DASH diet, exercise. Follow up in 2 weeks

## 2018-05-27 ENCOUNTER — Other Ambulatory Visit: Payer: Self-pay

## 2018-05-27 NOTE — Patient Outreach (Signed)
[facility]) Care Management  05/27/2018  Gloria Allen 1946-08-20 [id number]   Medication Adherence call to Gloria Allen left a message for patient to call back patient is due on Lovastatin 40 mg. Gloria Allen is showing past due under [facility] ins.   [place] Management Direct Dial [phone]  Fax [phone] [email or web address]

## 2018-06-16 ENCOUNTER — Ambulatory Visit: Payer: Medicare Other | Admitting: Family Medicine

## 2018-06-16 DIAGNOSIS — Z1159 Encounter for screening for other viral diseases: Secondary | ICD-10-CM | POA: Diagnosis not present

## 2018-06-16 DIAGNOSIS — M545 Low back pain: Secondary | ICD-10-CM | POA: Diagnosis not present

## 2018-06-16 DIAGNOSIS — R748 Abnormal levels of other serum enzymes: Secondary | ICD-10-CM | POA: Diagnosis not present

## 2018-06-16 DIAGNOSIS — Z803 Family history of malignant neoplasm of breast: Secondary | ICD-10-CM | POA: Diagnosis not present

## 2018-06-16 DIAGNOSIS — R7309 Other abnormal glucose: Secondary | ICD-10-CM | POA: Diagnosis not present

## 2018-06-16 DIAGNOSIS — I10 Essential (primary) hypertension: Secondary | ICD-10-CM | POA: Diagnosis not present

## 2018-06-17 ENCOUNTER — Ambulatory Visit: Payer: Medicare Other | Admitting: Family Medicine

## 2018-06-19 ENCOUNTER — Encounter: Payer: Self-pay | Admitting: Family Medicine

## 2018-06-20 ENCOUNTER — Other Ambulatory Visit: Payer: Self-pay | Admitting: Family Medicine

## 2018-06-23 ENCOUNTER — Ambulatory Visit: Payer: Medicare Other

## 2018-06-26 ENCOUNTER — Ambulatory Visit (INDEPENDENT_AMBULATORY_CARE_PROVIDER_SITE_OTHER): Payer: Medicare Other

## 2018-06-26 VITALS — BP 188/79 | HR 52 | Temp 98.1°F | Resp 16 | Ht 64.5 in | Wt 171.0 lb

## 2018-06-26 DIAGNOSIS — Z1211 Encounter for screening for malignant neoplasm of colon: Secondary | ICD-10-CM | POA: Diagnosis not present

## 2018-06-26 DIAGNOSIS — Z0000 Encounter for general adult medical examination without abnormal findings: Secondary | ICD-10-CM | POA: Diagnosis not present

## 2018-06-26 NOTE — Patient Instructions (Signed)
Gloria Allen , Thank you for taking time to come for your Medicare Wellness Visit. I appreciate your ongoing commitment to your health goals. Please review the following plan we discussed and let me know if I can assist you in the future.   Screening recommendations/referrals: Colonoscopy: someone will call you to schedule this. Mammogram: Please call [phone] to schedule your mammogram.  Bone Density: Please call [phone] to schedule same time as mammogram Recommended yearly ophthalmology/optometry visit for glaucoma screening and checkup Recommended yearly dental visit for hygiene and checkup  Vaccinations: Influenza vaccine: declined  Pneumococcal vaccine: completed series Tdap vaccine: due, check with your insurance company for coverage  Shingles vaccine: shingrix eligible, check with your insurance company for coverage   Advanced directives: Advance directive discussed with you today. Even though you declined this today please call our office should you change your mind and we can give you the proper paperwork for you to fill out.  Conditions/risks identified: Recommend drinking at least 6-8 glasses of water a day   Next appointment: Follow up with Dr.Johnson in 1-2 weeks, follow up in one year for your annual wellness exam.    Preventive Care 65 Years and Older, Female Preventive care refers to lifestyle choices and visits with your health care provider that can promote health and wellness. What does preventive care include?  A yearly physical exam. This is also called an annual well check.  Dental exams once or twice a year.  Routine eye exams. Ask your health care provider how often you should have your eyes checked.  Personal lifestyle choices, including:  Daily care of your teeth and gums.  Regular physical activity.  Eating a healthy diet.  Avoiding tobacco and drug use.  Limiting alcohol use.  Practicing safe sex.  Taking low-dose aspirin every  day.  Taking vitamin and mineral supplements as recommended by your health care provider. What happens during an annual well check? The services and screenings done by your health care provider during your annual well check will depend on your age, overall health, lifestyle risk factors, and family history of disease. Counseling  Your health care provider may ask you questions about your:  Alcohol use.  Tobacco use.  Drug use.  Emotional well-being.  Home and relationship well-being.  Sexual activity.  Eating habits.  History of falls.  Memory and ability to understand (cognition).  Work and work Statistician.  Reproductive health. Screening  You may have the following tests or measurements:  Height, weight, and BMI.  Blood pressure.  Lipid and cholesterol levels. These may be checked every 5 years, or more frequently if you are over 67 years old.  Skin check.  Lung cancer screening. You may have this screening every year starting at age 58 if you have a 30-pack-year history of smoking and currently smoke or have quit within the past 15 years.  Fecal occult blood test (FOBT) of the stool. You may have this test every year starting at age 25.  Flexible sigmoidoscopy or colonoscopy. You may have a sigmoidoscopy every 5 years or a colonoscopy every 10 years starting at age 58.  Hepatitis C blood test.  Hepatitis B blood test.  Sexually transmitted disease (STD) testing.  Diabetes screening. This is done by checking your blood sugar (glucose) after you have not eaten for a while (fasting). You may have this done every 1-3 years.  Bone density scan. This is done to screen for osteoporosis. You may have this done starting at age 30.  Mammogram. This may be done every 1-2 years. Talk to your health care provider about how often you should have regular mammograms. Talk with your health care provider about your test results, treatment options, and if necessary, the need  for more tests. Vaccines  Your health care provider may recommend certain vaccines, such as:  Influenza vaccine. This is recommended every year.  Tetanus, diphtheria, and acellular pertussis (Tdap, Td) vaccine. You may need a Td booster every 10 years.  Zoster vaccine. You may need this after age 6.  Pneumococcal 13-valent conjugate (PCV13) vaccine. One dose is recommended after age 43.  Pneumococcal polysaccharide (PPSV23) vaccine. One dose is recommended after age 41. Talk to your health care provider about which screenings and vaccines you need and how often you need them. This information is not intended to replace advice given to you by your health care provider. Make sure you discuss any questions you have with your health care provider. Document Released: 10/14/2015 Document Revised: 06/06/2016 Document Reviewed: 07/19/2015 Elsevier Interactive Patient Education  2017 [employer] Prevention in the Home Falls can cause injuries. They can happen to people of all ages. There are many things you can do to make your home safe and to help prevent falls. What can I do on the outside of my home?  Regularly fix the edges of walkways and driveways and fix any cracks.  Remove anything that might make you trip as you walk through a door, such as a raised step or threshold.  Trim any bushes or trees on the path to your home.  Use bright outdoor lighting.  Clear any walking paths of anything that might make someone trip, such as rocks or tools.  Regularly check to see if handrails are loose or broken. Make sure that both sides of any steps have handrails.  Any raised decks and porches should have guardrails on the edges.  Have any leaves, snow, or ice cleared regularly.  Use sand or salt on walking paths during winter.  Clean up any spills in your garage right away. This includes oil or grease spills. What can I do in the bathroom?  Use night lights.  Install grab bars  by the toilet and in the tub and shower. Do not use towel bars as grab bars.  Use non-skid mats or decals in the tub or shower.  If you need to sit down in the shower, use a plastic, non-slip stool.  Keep the floor dry. Clean up any water that spills on the floor as soon as it happens.  Remove soap buildup in the tub or shower regularly.  Attach bath mats securely with double-sided non-slip rug tape.  Do not have throw rugs and other things on the floor that can make you trip. What can I do in the bedroom?  Use night lights.  Make sure that you have a light by your bed that is easy to reach.  Do not use any sheets or blankets that are too big for your bed. They should not hang down onto the floor.  Have a firm chair that has side arms. You can use this for support while you get dressed.  Do not have throw rugs and other things on the floor that can make you trip. What can I do in the kitchen?  Clean up any spills right away.  Avoid walking on wet floors.  Keep items that you use a lot in easy-to-reach places.  If you need to reach  something above you, use a strong step stool that has a grab bar.  Keep electrical cords out of the way.  Do not use floor polish or wax that makes floors slippery. If you must use wax, use non-skid floor wax.  Do not have throw rugs and other things on the floor that can make you trip. What can I do with my stairs?  Do not leave any items on the stairs.  Make sure that there are handrails on both sides of the stairs and use them. Fix handrails that are broken or loose. Make sure that handrails are as long as the stairways.  Check any carpeting to make sure that it is firmly attached to the stairs. Fix any carpet that is loose or worn.  Avoid having throw rugs at the top or bottom of the stairs. If you do have throw rugs, attach them to the floor with carpet tape.  Make sure that you have a light switch at the top of the stairs and the  bottom of the stairs. If you do not have them, ask someone to add them for you. What else can I do to help prevent falls?  Wear shoes that:  Do not have high heels.  Have rubber bottoms.  Are comfortable and fit you well.  Are closed at the toe. Do not wear sandals.  If you use a stepladder:  Make sure that it is fully opened. Do not climb a closed stepladder.  Make sure that both sides of the stepladder are locked into place.  Ask someone to hold it for you, if possible.  Clearly mark and make sure that you can see:  Any grab bars or handrails.  First and last steps.  Where the edge of each step is.  Use tools that help you move around (mobility aids) if they are needed. These include:  Canes.  Walkers.  Scooters.  Crutches.  Turn on the lights when you go into a dark area. Replace any light bulbs as soon as they burn out.  Set up your furniture so you have a clear path. Avoid moving your furniture around.  If any of your floors are uneven, fix them.  If there are any pets around you, be aware of where they are.  Review your medicines with your doctor. Some medicines can make you feel dizzy. This can increase your chance of falling. Ask your doctor what other things that you can do to help prevent falls. This information is not intended to replace advice given to you by your health care provider. Make sure you discuss any questions you have with your health care provider. Document Released: 07/14/2009 Document Revised: 02/23/2016 Document Reviewed: 10/22/2014 Elsevier Interactive Patient Education  2017 [employer].

## 2018-06-26 NOTE — Progress Notes (Signed)
Subjective:   Gloria Allen is a 72 y.o. female who presents for Medicare Annual (Subsequent) preventive examination.  Review of Systems:  Cardiac Risk Factors include: hypertension;advanced age (>48mn, >>12women);dyslipidemia;obesity (BMI >30kg/m2)     Objective:     Vitals: BP (!) 188/79 (BP Location: Left Arm, Patient Position: Sitting)   Pulse (!) 52   Temp 98.1 F (36.7 C) (Temporal)   Resp 16   Ht 5' 4.5" (1.638 m)   Wt 171 lb (77.6 kg)   BMI 28.90 kg/m   Body mass index is 28.9 kg/m.  Advanced Directives 06/26/2018 07/31/2017 06/20/2017 06/11/2017 03/26/2017 03/26/2017 03/21/2017  Does Patient Have a Medical Advance Directive? _0  No No  Would patient like information on creating a medical advance directive? No - Patient declined - Yes (MAU/Ambulatory/Procedural Areas - Information given) Yes (Inpatient - patient defers creating a medical advance directive at this time) No - Patient declined - No - Patient declined    Tobacco Social History   Tobacco Use  Smoking Status Former Smoker  . Packs/day: 1.00  . Types: Cigarettes  Smokeless Tobacco Never Used  Tobacco Comment   quit over 30 years ago      Counseling given: Not Answered Comment: quit over 30 years ago    Clinical Intake:  Pre-visit preparation completed: Yes  Pain : 0-10 Pain Score: 8  Pain Type: Chronic pain Pain Location: Back Pain Orientation: Lower Pain Descriptors / Indicators: Aching Pain Onset: More than a month ago Pain Frequency: Constant Pain Relieving Factors: BC powder  Pain Relieving Factors: BC powder  Nutritional Status: BMI 25 -29 Overweight Nutritional Risks: None Diabetes: No  How often do you need to have someone help you when you read instructions, pamphlets, or other written materials from your doctor or pharmacy?: 1 - Never What is the last grade level you completed in school?: 11th grade   Interpreter Needed?: No  Information entered by :: Tuwana Kapaun,LPN   Past Medical History:  Diagnosis Date  . Anxiety   . Arthritis   . Cervical central spinal stenosis (C3-C7) (worse at C4-5) 07/30/2017  . Cervical foraminal stenosis (C4-5 and C5-6) (Bilateral) (L>R) 07/30/2017  . Chronic lower extremity pain (Fourth Area of Pain) (Bilateral) (R>L) 06/11/2017  . Chronic neck pain (Primary Area of Pain) (Bilateral) (R>L) 06/11/2017  . Chronic sacroiliac joint pain (Bilateral) (R>L) 06/11/2017  . [facility] (Right) 06/11/2017  . Chronic upper extremity pain (Fifth Area of Pain) (Bilateral) (R>L) 06/11/2017  . DDD (degenerative disc disease), cervical 07/30/2017  . DDD (degenerative disc disease), lumbar 07/30/2017  . Depression   . DISH (diffuse idiopathic skeletal hyperostosis) 07/30/2017  . Dyspnea    with exertion  . Entrapment syndrome 06/20/2017   2002 on R and 2010 on L  . Full thickness rotator cuff tear 06/12/2017  . GERD (gastroesophageal reflux disease)   . Grade 1 Anterolisthesis of L4 over L5 07/30/2017  . Headache   . Hx: UTI (urinary tract infection)   . Hypertension   . Inflammation of joint of shoulder region 01/15/2017  . Lumbar central spinal stenosis (L4-5) 07/30/2017  . Lumbar disc protrusion (Left: L5-S1) (Right: L1-2) 07/30/2017   L5-S1 left foraminal protrusion with L5 impingement. L1-2 right paracentral protrusion without impingement.  . Lumbar facet arthropathy (Bilateral) 07/30/2017  . Lumbar facet syndrome (Bilateral) (R>L) 07/30/2017  . Osteoarthritis of shoulder (Right) 01/15/2017   Past Surgical History:  Procedure Laterality  Date  . ABDOMINAL HYSTERECTOMY    . APPENDECTOMY    . BREAST REDUCTION SURGERY Bilateral   . DILATION AND CURETTAGE OF UTERUS    . SHOULDER ARTHROSCOPY WITH OPEN ROTATOR CUFF REPAIR AND DISTAL CLAVICLE ACROMINECTOMY Right 03/26/2017   Procedure: right shoulder arthroscopy, arthroscopic subacromial decompression, distal clavicle excision, mini open rotator  cuff repair;  Surgeon: Thornton Park, MD;  Location: [facility] ORS;  Service: Orthopedics;  Laterality: Right;   Family History  Problem Relation Age of Onset  . Hypertension Mother    Social History   Socioeconomic History  . Marital status: Divorced    Spouse name: Not on file  . Number of children: Not on file  . Years of education: Not on file  . Highest education level: 11th grade  Occupational History  . Not on file  Social Needs  . Financial resource strain: Somewhat hard  . Food insecurity:    Worry: Sometimes true    Inability: Never true  . Transportation needs:    Medical: Yes    Non-medical: No  Tobacco Use  . Smoking status: Former Smoker    Packs/day: 1.00    Types: Cigarettes  . Smokeless tobacco: Never Used  . Tobacco comment: quit over 30 years ago   Substance and Sexual Activity  . Alcohol use: No  . Drug use: No  . Sexual activity: Never  Lifestyle  . Physical activity:    Days per week: 0 days    Minutes per session: 0 min  . Stress: Not at all  Relationships  . Social connections:    Talks on phone: Never    Gets together: Never    Attends religious service: More than 4 times per year    Active member of club or organization: No    Attends meetings of clubs or organizations: Never    Relationship status: Divorced  Other Topics Concern  . Not on file  Social History Narrative  . Not on file    Outpatient Encounter Medications as of 06/26/2018  Medication Sig  . albuterol (PROVENTIL HFA;VENTOLIN HFA) 108 (90 Base) MCG/ACT inhaler Inhale 2 puffs into the lungs every 6 (six) hours as needed for wheezing or shortness of breath.  Jearl Klinefelter ELLIPTA 62.5-25 MCG/INH AEPB INHALE 1 PUFF BY MOUTH DAILY  . ARIPiprazole (ABILIFY) 5 MG tablet TAKE 1 TABLET BY MOUTH DAILY  . cloNIDine (CATAPRES) 0.3 MG tablet Take 2 tablets (0.6 mg total) by mouth 2 (two) times daily.  . hydrALAZINE (APRESOLINE) 100 MG tablet Take 1 tablet (100 mg total) by mouth 3 (three)  times daily.  . hydrOXYzine (ATARAX/VISTARIL) 10 MG tablet TAKE 1 TABLET BY MOUTH THREE TIMES A DAY AS NEEDED  . lovastatin (MEVACOR) 40 MG tablet Take 1 tablet (40 mg total) by mouth at bedtime.  . methocarbamol (ROBAXIN) 500 MG tablet Take 1 tablet (500 mg total) by mouth at bedtime as needed for muscle spasms.  . metoprolol succinate (TOPROL-XL) 100 MG 24 hr tablet TAKE 1 TABLET BY MOUTH DAILY  . mirtazapine (REMERON) 15 MG tablet take 1 tablet by mouth at bedtime for APPETITIE  . montelukast (SINGULAIR) 10 MG tablet Take 1 tablet (10 mg total) by mouth at bedtime as needed.  Marland Kitchen omeprazole (PRILOSEC) 40 MG capsule Take 1 capsule (40 mg total) by mouth 2 (two) times daily.  Marland Kitchen oxybutynin (DITROPAN) 5 MG tablet take 1 tablet by mouth twice a day for bladder LEAKAGE  . sertraline (ZOLOFT) 100 MG tablet Take  2 tablets (200 mg total) by mouth at bedtime.  Marland Kitchen terazosin (HYTRIN) 5 MG capsule TAKE 1 CAPSULE BY MOUTH ONCE DAILY AT BEDTIME  . traZODone (DESYREL) 50 MG tablet Take 0.5-1 tablets (25-50 mg total) by mouth at bedtime as needed for sleep.  Marland Kitchen triamcinolone cream (KENALOG) 0.1 % APPLY TOPICALLY TWICE DAILY AS NEEDED FOR ITCHING  . ARIPiprazole (ABILIFY) 10 MG tablet Take 1 tablet (10 mg total) by mouth daily. (Patient not taking: Reported on 06/26/2018)  . clonazePAM (KLONOPIN) 0.5 MG tablet Take 0.5 mg by mouth 3 (three) times daily as needed for anxiety.   . hydrALAZINE (APRESOLINE) 50 MG tablet TAKE 1 TABLET BY MOUTH THREE TIMES DAILY (Patient not taking: Reported on 06/26/2018)  . nitrofurantoin (MACRODANTIN) 100 MG capsule Take 100 mg by mouth 2 (two) times daily.   No facility-administered encounter medications on file as of 06/26/2018.     Activities of Daily Living In your present state of health, do you have any difficulty performing the following activities: 06/26/2018 04/22/2018  Hearing? Y N  Comment no hearing aids  -  Vision? Y Y  Difficulty concentrating or making decisions? Tempie Donning    Comment comes back to her when she forgets -  Walking or climbing stairs? Y Y  Comment due to pain  -  Dressing or bathing? N Y  Doing errands, shopping? Y Y  Comment when dizzy  -  Preparing Food and eating ? N -  Using the Toilet? N -  In the past six months, have you accidently leaked urine? Y -  Comment wears pads for protection  -  Do you have problems with loss of bowel control? N -  Managing your Medications? N -  Managing your Finances? N -  Housekeeping or managing your Housekeeping? N -  Some recent data might be hidden    Patient Care Team: Valerie Roys, DO as PCP - General (Family Medicine)    Assessment:   This is a routine wellness examination for Viola.  Exercise Activities and Dietary recommendations Current Exercise Habits: The patient does not participate in regular exercise at present, Exercise limited by: None identified  Goals    . DIET - INCREASE WATER INTAKE     Recommend drinking at least 6-8 glasses of water a day        Fall Risk Fall Risk  06/26/2018 05/05/2018 08/05/2017 07/31/2017 06/20/2017  Falls in the past year? Yes Yes Yes Yes No  Number falls in past yr: 1 2 or more 2 or more 2 or more 2 or more  Injury with Fall? No No Yes Yes Yes  Risk Factor Category  - - High Fall Risk High Fall Risk High Fall Risk  Risk for fall due to : Impaired balance/gait - - History of fall(s) Impaired balance/gait;History of fall(s)  Risk for fall due to: Comment - - - went to ER -  Follow up Falls evaluation completed;Falls prevention discussed - - Falls evaluation completed Falls prevention discussed   Is the patient's home free of loose throw rugs in walkways, pet beds, electrical cords, etc?   yes      Grab bars in the bathroom? yes      Handrails on the stairs?   no stairs      Adequate lighting?   yes  Timed Get Up and Go performed: Completed in 8 seconds with no use of assistive devices, steady gait. No intervention needed at this time.  Depression Screen PHQ 2/9 Scores 06/26/2018 05/05/2018 04/22/2018 08/05/2017  PHQ - 2 Score _0 PHQ- 9 Score 11 - 19 15  Exception Documentation - - - -     Cognitive Function     6CIT Screen 06/26/2018 06/20/2017  What Year? 0 points 0 points  What month? 0 points 0 points  What time? 0 points 0 points  Count back from 20 0 points 0 points  Months in reverse 0 points 0 points  Repeat phrase 0 points 0 points  Total Score 0 0    Immunization History  Administered Date(s) Administered  . MMR 05/20/2000  . Pneumococcal Conjugate-13 03/29/2015  . Pneumococcal Polysaccharide-23 03/30/2016    Influenza: declined Tetanus: Discussed need for TD/TDAP vaccine, patient verbalized understanding that this is not covered as a preventative with there insurance and to call the office if she develops any new skin injuries, ie: cuts, scrapes, bug bites, or open wounds. Pneumococcal: completed series   Qualifies for Shingles Vaccine?  Yes, discussed shingrix vaccine   Cancer Screenings: Lung: Low Dose CT Chest recommended if Age 23-80 years, 30 pack-year currently smoking OR have quit w/in 15years. Patient does not qualify. Breast: Up to date on Mammogram? No  ordered Up to date of Bone Density/Dexa? No ordered Colorectal: referral generated to GI   Additional Screenings:  Hepatitis C Screening:  Completed 06/06/2017     Plan:    I have personally reviewed and addressed the Medicare Annual Wellness questionnaire and have noted the following in the patient's chart:  A. Medical and social history B. Use of alcohol, tobacco or illicit drugs  C. Current medications and supplements D. Functional ability and status E.  Nutritional status F.  Physical activity G. Advance directives H. List of other physicians I.  Hospitalizations, surgeries, and ER visits in previous 12 months J.  [place] such as hearing and vision if needed, cognitive and depression L. Referrals and  appointments   In addition, I have reviewed and discussed with patient certain preventive protocols, quality metrics, and best practice recommendations. A written personalized care plan for preventive services as well as general preventive health recommendations were provided to patient.   Signed,  Tyler Aas, LPN Nurse Health Advisor   Nurse Notes: PHQ-9 score is 11, advised patient a referral was generated for her to go to psychiatry. pt states she doesn't feel she needs to go to psychiatrist but is still adamant about having klonopin refilled, advised to make a follow up appt with Dr.Johnson to discuss phq-9 and her anxiety concerns.  Had lengthy conversation with patient discussing importance of medication compliance and coming to her follow up appointments.  Transportation discussion: patient states she does have a car and can drive but sometimes feels she is too dizzy to drive herself.  Discussed with patient if she needs transportation assistance to contact office within 48 hours prior to try and arrange assistance. A referral was also generated to connected care to inform her of possible transportation arrangements when needed.  Patient verbalized all understanding and denies any further questions or concerns at this time.   Patient left without scheduling follow up appt as discussed, front office will call patient to schedule a follow up with Dr.Johnson.

## 2018-07-17 DIAGNOSIS — Z1239 Encounter for other screening for malignant neoplasm of breast: Secondary | ICD-10-CM | POA: Diagnosis not present

## 2018-07-17 DIAGNOSIS — R7309 Other abnormal glucose: Secondary | ICD-10-CM | POA: Diagnosis not present

## 2018-07-17 DIAGNOSIS — R748 Abnormal levels of other serum enzymes: Secondary | ICD-10-CM | POA: Diagnosis not present

## 2018-07-17 DIAGNOSIS — Z1211 Encounter for screening for malignant neoplasm of colon: Secondary | ICD-10-CM | POA: Diagnosis not present

## 2018-07-18 ENCOUNTER — Other Ambulatory Visit: Payer: Self-pay | Admitting: Obstetrics and Gynecology

## 2018-07-18 DIAGNOSIS — Z1231 Encounter for screening mammogram for malignant neoplasm of breast: Secondary | ICD-10-CM

## 2018-08-13 ENCOUNTER — Ambulatory Visit
Admission: RE | Admit: 2018-08-13 | Discharge: 2018-08-13 | Disposition: A | Discharge status: Home / Self Care | Payer: Medicare Other | Source: Ambulatory Visit | Attending: Obstetrics and Gynecology | Admitting: Obstetrics and Gynecology

## 2018-08-13 DIAGNOSIS — Z1231 Encounter for screening mammogram for malignant neoplasm of breast: Secondary | ICD-10-CM | POA: Insufficient documentation

## 2018-08-15 ENCOUNTER — Telehealth: Payer: Self-pay | Admitting: Family Medicine

## 2018-08-15 NOTE — Telephone Encounter (Signed)
I have not seen patient since July- was supposed to have close follow up. Has not been seen. Needs follow up so we can figure out what's going on with her.

## 2018-08-15 NOTE — Telephone Encounter (Signed)
Copied from [place] [id number]. Topic: Quick Communication - See Telephone Encounter >> Aug 15, 2018  9:05 AM Conception Chancy, NT wrote: CRM for notification. See Telephone encounter for: 08/15/18.  [facility] is calling and is needing to clarify which inhaler the patient is supposed to be on. They states they have prescriptions for Symbicort, Anoro, and Brio inhalers. Please contact.   Cb# [phone]

## 2018-08-18 ENCOUNTER — Encounter: Payer: Self-pay | Admitting: Family Medicine

## 2018-08-18 DIAGNOSIS — R7309 Other abnormal glucose: Secondary | ICD-10-CM | POA: Diagnosis not present

## 2018-08-18 DIAGNOSIS — R0600 Dyspnea, unspecified: Secondary | ICD-10-CM | POA: Diagnosis not present

## 2018-08-18 DIAGNOSIS — I10 Essential (primary) hypertension: Secondary | ICD-10-CM | POA: Diagnosis not present

## 2018-08-18 DIAGNOSIS — Z1211 Encounter for screening for malignant neoplasm of colon: Secondary | ICD-10-CM | POA: Diagnosis not present

## 2018-08-18 NOTE — Telephone Encounter (Signed)
Called pt, no answer. Will mail letter to send to pt.

## 2018-08-20 DIAGNOSIS — R0789 Other chest pain: Secondary | ICD-10-CM | POA: Diagnosis not present

## 2018-08-20 DIAGNOSIS — R0602 Shortness of breath: Secondary | ICD-10-CM | POA: Diagnosis not present

## 2018-08-20 DIAGNOSIS — R270 Ataxia, unspecified: Secondary | ICD-10-CM | POA: Diagnosis not present

## 2018-08-20 NOTE — Telephone Encounter (Signed)
Called Pt and pharmacy advised per Dr. Wynetta Emery pt needs to been seen. Pt states that she would have to call back and make an appointment.

## 2018-08-20 NOTE — Telephone Encounter (Signed)
LVM for pt to call back.

## 2018-08-20 NOTE — Telephone Encounter (Signed)
Terry with [facility] called back and would like to know exactly which inhalers the patient needs to be using. She said that they call patient's once a month to see what they need filled. She said that the patient approved for her to fill all three of the inhalers. She is just trying to clarify which ones exactly is she using. She will be faxing something over as well

## 2018-08-21 ENCOUNTER — Encounter: Payer: Self-pay | Admitting: Gastroenterology

## 2018-08-21 ENCOUNTER — Ambulatory Visit (INDEPENDENT_AMBULATORY_CARE_PROVIDER_SITE_OTHER): Payer: Medicare Other | Admitting: Gastroenterology

## 2018-08-21 VITALS — BP 136/58 | HR 47 | Ht 65.0 in | Wt 163.8 lb

## 2018-08-21 DIAGNOSIS — R748 Abnormal levels of other serum enzymes: Secondary | ICD-10-CM

## 2018-08-22 ENCOUNTER — Other Ambulatory Visit: Payer: Self-pay

## 2018-08-22 ENCOUNTER — Other Ambulatory Visit: Payer: Self-pay | Admitting: Family Medicine

## 2018-08-22 DIAGNOSIS — Z1211 Encounter for screening for malignant neoplasm of colon: Secondary | ICD-10-CM

## 2018-08-22 DIAGNOSIS — K921 Melena: Secondary | ICD-10-CM

## 2018-08-22 NOTE — Progress Notes (Addendum)
Gloria Allen [address]  [place]  [place], [place] [address]  Main: [phone]  Fax: [phone]   Gastroenterology Consultation  Referring Provider:     Gwynne Edinger, MD Primary Care Physician:  Valerie Roys, DO Primary Gastroenterologist:  Dr. Vonda Allen Reason for Consultation:     Colorectal cancer screening, elevated alk phos        HPI:    Chief Complaint  Patient presents with  . New Patient (Initial Visit)    referred by N. Wouk, MD for colon cancer screening (pt preferred colonguard test); elevated hgb; and increase alkaline phosphates    Gloria Allen is a 72 y.o. y/o female referred for consultation & management  by Dr. Wynetta Emery, Megan P, DO.  Patient denies any blood in stool.  Denies any family history of colon cancer.  Reports a history of colonoscopy over 10 years ago that we do not have the results of.  Patient does not remember the results either.  No blood in stool.  No nausea or vomiting.  No abdominal pain.  No weight loss.  CBC shows normal hemoglobin at 13.4 in July 2019.  MCV normal at 84. CMP shows elevated alk phos to 149 in July 2019 labs and it was normal in April 2019.  Patient denies any pruritus.  No fatigue.  Patient also reports taking BC powder frequently.  Reports noting dark brown stool intermittently.  Denies any black stool.  Past Medical History:  Diagnosis Date  . Anxiety   . Arthritis   . Cervical central spinal stenosis (C3-C7) (worse at C4-5) 07/30/2017  . Cervical foraminal stenosis (C4-5 and C5-6) (Bilateral) (L>R) 07/30/2017  . Chronic lower extremity pain (Fourth Area of Pain) (Bilateral) (R>L) 06/11/2017  . Chronic neck pain (Primary Area of Pain) (Bilateral) (R>L) 06/11/2017  . Chronic sacroiliac joint pain (Bilateral) (R>L) 06/11/2017  . Chronic shoulder pain [facility] Area [facility] Pain) (Right) 06/11/2017  . Chronic upper extremity pain (Fifth Area of Pain) (Bilateral) (R>L) 06/11/2017  . DDD  (degenerative disc disease), cervical 07/30/2017  . DDD (degenerative disc disease), lumbar 07/30/2017  . Depression   . DISH (diffuse idiopathic skeletal hyperostosis) 07/30/2017  . Dyspnea    with exertion  . Entrapment syndrome 06/20/2017   2002 on R and 2010 on L  . Full thickness rotator cuff tear 06/12/2017  . GERD (gastroesophageal reflux disease)   . Grade 1 Anterolisthesis of L4 over L5 07/30/2017  . Headache   . Hx: UTI (urinary tract infection)   . Hypertension   . Inflammation of joint of shoulder region 01/15/2017  . Lumbar central spinal stenosis (L4-5) 07/30/2017  . Lumbar disc protrusion (Left: L5-S1) (Right: L1-2) 07/30/2017   L5-S1 left foraminal protrusion with L5 impingement. L1-2 right paracentral protrusion without impingement.  . Lumbar facet arthropathy (Bilateral) 07/30/2017  . Lumbar facet syndrome (Bilateral) (R>L) 07/30/2017  . Osteoarthritis of shoulder (Right) 01/15/2017    Past Surgical History:  Procedure Laterality Date  . ABDOMINAL HYSTERECTOMY    . APPENDECTOMY    . BREAST REDUCTION SURGERY Bilateral   . DILATION AND CURETTAGE OF UTERUS    . REDUCTION MAMMAPLASTY    . SHOULDER ARTHROSCOPY WITH OPEN ROTATOR CUFF REPAIR AND DISTAL CLAVICLE ACROMINECTOMY Right 03/26/2017   Procedure: right shoulder arthroscopy, arthroscopic subacromial decompression, distal clavicle excision, mini open rotator cuff repair;  Surgeon: Thornton Park, MD;  Location: [facility] ORS;  Service: Orthopedics;  Laterality: Right;    Prior to Admission medications  Medication Sig Start Date End Date Taking? Authorizing Provider  albuterol (PROVENTIL HFA;VENTOLIN HFA) 108 (90 Base) MCG/ACT inhaler Inhale 2 puffs into the lungs every 6 (six) hours as needed for wheezing or shortness of breath. 04/02/18  Yes Kathrine Haddock, NP  ANORO ELLIPTA 62.5-25 MCG/INH AEPB INHALE 1 PUFF BY MOUTH DAILY 06/22/18  Yes Johnson, Megan P, DO  clonazePAM (KLONOPIN) 0.5 MG tablet Take 0.5 mg by mouth 3  (three) times daily as needed for anxiety.    Yes [provider]  hydrALAZINE (APRESOLINE) 100 MG tablet Take 1 tablet (100 mg total) by mouth 3 (three) times daily. 04/22/18  Yes Johnson, Megan P, DO  hydrOXYzine (ATARAX/VISTARIL) 10 MG tablet TAKE 1 TABLET BY MOUTH THREE TIMES A DAY AS NEEDED 06/22/18  Yes Johnson, Megan P, DO  lovastatin (MEVACOR) 40 MG tablet Take 1 tablet (40 mg total) by mouth at bedtime. 04/24/18  Yes Johnson, Megan P, DO  metoprolol succinate (TOPROL-XL) 100 MG 24 hr tablet TAKE 1 TABLET BY MOUTH DAILY 06/22/18  Yes Johnson, Megan P, DO  mirtazapine (REMERON) 15 MG tablet take 1 tablet by mouth at bedtime for APPETITIE 04/24/18  Yes Johnson, Megan P, DO  montelukast (SINGULAIR) 10 MG tablet Take 1 tablet (10 mg total) by mouth at bedtime as needed. 04/02/18  Yes Kathrine Haddock, NP  nitrofurantoin (MACRODANTIN) 100 MG capsule Take 100 mg by mouth 2 (two) times daily.   Yes [provider]  omeprazole (PRILOSEC) 40 MG capsule Take 1 capsule (40 mg total) by mouth 2 (two) times daily. 04/24/18  Yes Johnson, Megan P, DO  oxybutynin (DITROPAN) 5 MG tablet take 1 tablet by mouth twice a day for bladder LEAKAGE 08/08/17  Yes Johnson, Megan P, DO  sertraline (ZOLOFT) 100 MG tablet Take 2 tablets (200 mg total) by mouth at bedtime. 04/24/18  Yes Johnson, Megan P, DO  traZODone (DESYREL) 50 MG tablet Take 0.5-1 tablets (25-50 mg total) by mouth at bedtime as needed for sleep. 04/24/18  Yes Johnson, Megan P, DO  triamcinolone cream (KENALOG) 0.1 % APPLY TOPICALLY TWICE DAILY AS NEEDED FOR ITCHING 06/22/18  Yes Johnson, Megan P, DO  ARIPiprazole (ABILIFY) 10 MG tablet Take 1 tablet (10 mg total) by mouth daily. Patient not taking: Reported on 06/26/2018 05/05/18   [place], PA-C  ARIPiprazole (ABILIFY) 5 MG tablet TAKE 1 TABLET BY MOUTH DAILY Patient not taking: Reported on 08/21/2018 06/22/18   Park Liter P, DO  cloNIDine (CATAPRES) 0.3 MG tablet Take 2 tablets  (0.6 mg total) by mouth 2 (two) times daily. Patient not taking: Reported on 08/21/2018 05/19/18   Volney American, PA-C  hydrALAZINE (APRESOLINE) 50 MG tablet TAKE 1 TABLET BY MOUTH THREE TIMES DAILY Patient not taking: Reported on 06/26/2018 06/22/18   Park Liter P, DO  methocarbamol (ROBAXIN) 500 MG tablet Take 1 tablet (500 mg total) by mouth at bedtime as needed for muscle spasms. Patient not taking: Reported on 08/21/2018 05/19/18   Volney American, PA-C  terazosin (HYTRIN) 5 MG capsule TAKE 1 CAPSULE BY MOUTH ONCE DAILY AT BEDTIME Patient not taking: Reported on 08/21/2018 06/22/18   Valerie Roys, DO    Family History  Problem Relation Age of Onset  . Hypertension Mother   . Breast cancer Mother 76  . Breast cancer Sister 49     Social History   Tobacco Use  . Smoking status: Former Smoker    Packs/day: 1.00    Types: Cigarettes  .  Smokeless tobacco: Never Used  . Tobacco comment: quit over 30 years ago   Substance Use Topics  . Alcohol use: No  . Drug use: No    Allergies as of 08/21/2018 - Review Complete 08/21/2018  Allergen Reaction Noted  . Ace inhibitors Swelling 06/20/2017  . Angiotensin receptor blockers Swelling 06/20/2017  . Chlorthalidone Rash 04/22/2018  . Flexeril [cyclobenzaprine] Rash 04/22/2018    Review of Systems:    All systems reviewed and negative except where noted in HPI.   Physical Exam:  BP (!) 136/58   Pulse (!) 47   Ht '5\' 5"'$  (1.651 m)   Wt 163 lb 12.8 oz (74.3 kg)   BMI 27.26 kg/m  No LMP recorded. Patient has had a hysterectomy. Psych:  Alert and cooperative. Normal mood and affect. General:   Alert,  Well-developed, well-nourished, pleasant and cooperative in NAD Head:  Normocephalic and atraumatic. Eyes:  Sclera clear, no icterus.   Conjunctiva pink. Ears:  Normal auditory acuity. Nose:  No deformity, discharge, or lesions. Mouth:  No deformity or lesions,oropharynx pink & moist. Neck:  Supple; no masses or  thyromegaly. Abdomen:  Normal bowel sounds.  No bruits.  Soft, non-tender and non-distended without masses, hepatosplenomegaly or hernias noted.  No guarding or rebound tenderness.    Msk:  Symmetrical without gross deformities. Good, equal movement & strength bilaterally. Pulses:  Normal pulses noted. Extremities:  No clubbing or edema.  No cyanosis. Neurologic:  Alert and oriented x3;  grossly normal neurologically. Skin:  Intact without significant lesions or rashes. No jaundice. Lymph Nodes:  No significant cervical adenopathy. Psych:  Alert and cooperative. Normal mood and affect.   Labs: CBC    Component Value Date/Time   WBC 7.6 08/21/2018 1443   WBC 9.4 01/15/2018 1532   RBC 4.50 08/21/2018 1443   RBC 4.25 01/15/2018 1532   HGB 12.0 08/21/2018 1443   HCT 36.5 08/21/2018 1443   PLT 362 08/21/2018 1443   MCV 81 08/21/2018 1443   MCH 26.7 08/21/2018 1443   MCH 28.2 01/15/2018 1532   MCHC 32.9 08/21/2018 1443   MCHC 33.1 01/15/2018 1532   RDW 13.6 08/21/2018 1443   LYMPHSABS 2.2 04/22/2018 1559   MONOABS 0.5 01/15/2018 1532   EOSABS 0.2 04/22/2018 1559   BASOSABS 0.1 04/22/2018 1559   CMP     Component Value Date/Time   NA 141 08/21/2018 1443   K 4.1 08/21/2018 1443   CL 103 08/21/2018 1443   CO2 22 08/21/2018 1443   GLUCOSE 77 08/21/2018 1443   GLUCOSE 117 (H) 01/15/2018 1532   BUN 11 08/21/2018 1443   CREATININE 0.71 08/21/2018 1443   CALCIUM 9.3 08/21/2018 1443   PROT 6.6 08/21/2018 1443   ALBUMIN 4.2 08/21/2018 1443   AST 12 08/21/2018 1443   ALT 10 08/21/2018 1443   ALKPHOS 124 (H) 08/21/2018 1443   BILITOT <0.2 08/21/2018 1443   GFRNONAA 87 08/21/2018 1443   GFRAA 100 08/21/2018 1443    Imaging Studies: Mm 3d Screen Breast Bilateral  Result Date: 08/14/2018 CLINICAL DATA:  Screening. EXAM: DIGITAL SCREENING BILATERAL MAMMOGRAM WITH TOMO AND CAD COMPARISON:  Previous exam(s). ACR Breast Density Category b: There are scattered areas of fibroglandular  density. FINDINGS: There are no findings suspicious for malignancy. Images were processed with CAD. IMPRESSION: No mammographic evidence of malignancy. A result letter of this screening mammogram will be mailed directly to the patient. RECOMMENDATION: Screening mammogram in one year. (Code:SM-B-01Y) BI-RADS CATEGORY  1: Negative. Electronically  Signed   By: Kristopher Oppenheim M.D.   On: 08/14/2018 12:33    Assessment and Plan:   VORA CLOVER is a 72 y.o. y/o female has been referred for colorectal cancer screening and elevated alk phos  Colorectal cancer screening Patient reports history of a colonoscopy over 10 years ago and does not know the results She is agreeable to proceeding with colorectal cancer screening However, she states that she recently did a stool test at her primary care provider office and does not know the results There are no such records about the stool test in her chart. We will try to contact Dr. Park Liter in this regard to see what was done  Elevated alk phos No clinical evidence of liver dysfunction Repeat alk phos done today, shows improvement from 149 to 124.  Rest of her liver enzymes are normal. GGT is completely normal, and therefore elevation in alk phos is not due to the liver.  Primary care provider can look for other causes such as the bone with bone scan Hepatitis A and B and C serology negative Antimitochondrial and smooth muscle antibody pending CBC shows a normal hemoglobin and normal MCV  Frequent BC powder use Patient reports dark brown stool, states sometimes it is darker, but does not appear to be tarry We have discussed the risks of BC powder use and NSAIDs causing gastric ulcers We discussed conservative management versus evaluation with EGD to rule out any underlying gastric ulcers and patient would like to proceed with EGD along with her colonoscopy as well Avoid NSAIDs We will not start PPI as patient is otherwise asymptomatic.  If EGD  indicates PPI can be started at that time.  I have discussed alternative options, risks & benefits,  which include, but are not limited to, bleeding, infection, perforation,respiratory complication & drug reaction.  The patient agrees with this plan & written consent will be obtained.     Dr Gloria Allen  Speech recognition software was used to dictate the above note.

## 2018-08-24 LAB — COMPREHENSIVE METABOLIC PANEL
ALBUMIN: 4.2 g/dL (ref 3.5–4.8)
ALK PHOS: 124 IU/L — AB (ref 39–117)
ALT: 10 IU/L (ref 0–32)
AST: 12 IU/L (ref 0–40)
Albumin/Globulin Ratio: 1.8 (ref 1.2–2.2)
BUN / CREAT RATIO: 15 (ref 12–28)
BUN: 11 mg/dL (ref 8–27)
CO2: 22 mmol/L (ref 20–29)
CREATININE: 0.71 mg/dL (ref 0.57–1.00)
Calcium: 9.3 mg/dL (ref 8.7–10.3)
Chloride: 103 mmol/L (ref 96–106)
GFR calc Af Amer: 100 mL/min/{1.73_m2} (ref >59)
GFR calc non Af Amer: 87 mL/min/{1.73_m2} (ref >59)
GLUCOSE: 77 mg/dL (ref 65–99)
Globulin, Total: 2.4 g/dL (ref 1.5–4.5)
Potassium: 4.1 mmol/L (ref 3.5–5.2)
Sodium: 141 mmol/L (ref 134–144)
Total Protein: 6.6 g/dL (ref 6.0–8.5)

## 2018-08-24 LAB — CBC
HEMOGLOBIN: 12 g/dL (ref 11.1–15.9)
Hematocrit: 36.5 % (ref 34.0–46.6)
MCH: 26.7 pg (ref 26.6–33.0)
MCHC: 32.9 g/dL (ref 31.5–35.7)
MCV: 81 fL (ref 79–97)
PLATELETS: 362 10*3/uL (ref 150–450)
RBC: 4.5 x10E6/uL (ref 3.77–5.28)
RDW: 13.6 % (ref 12.3–15.4)
WBC: 7.6 10*3/uL (ref 3.4–10.8)

## 2018-08-24 LAB — GAMMA GT: GGT: 16 IU/L (ref 0–60)

## 2018-08-24 LAB — MITOCHONDRIAL/SMOOTH MUSCLE AB PNL: SMOOTH MUSCLE AB: 4 U (ref 0–19)

## 2018-08-24 LAB — HEPATITIS B CORE ANTIBODY, TOTAL: Hep B Core Total Ab: NEGATIVE

## 2018-08-24 LAB — HEPATITIS A ANTIBODY, TOTAL: HEP A TOTAL AB: NEGATIVE

## 2018-08-24 LAB — HCV COMMENT:

## 2018-08-24 LAB — HEPATITIS B SURFACE ANTIGEN: Hepatitis B Surface Ag: NEGATIVE

## 2018-08-24 LAB — HEPATITIS C ANTIBODY (REFLEX): HCV Ab: <0.1 s/co ratio (ref 0.0–0.9)

## 2018-08-24 LAB — HEPATITIS B SURFACE ANTIBODY,QUALITATIVE: Hep B Surface Ab, Qual: NONREACTIVE

## 2018-08-24 LAB — HEPATITIS A ANTIBODY, IGM: HEP A IGM: NEGATIVE

## 2018-08-25 ENCOUNTER — Telehealth: Payer: Self-pay | Admitting: Family Medicine

## 2018-08-25 NOTE — Telephone Encounter (Signed)
Please make sure she is not going to send the replacement sample for cologuard back in as she is going to have her colonoscopy

## 2018-08-25 NOTE — Telephone Encounter (Signed)
Patient notified

## 2018-08-25 NOTE — Telephone Encounter (Signed)
Requested Prescriptions  Pending Prescriptions Disp Refills  . oxybutynin (DITROPAN) 5 MG tablet [Pharmacy Med Name: OXYBUTYNIN 5MG  TAB] 180 tablet     Sig: TAKE 1 TABLET BY MOUTH TWICE DAILY     Urology:  Bladder Agents Passed - 08/22/2018 10:20 PM      Passed - Valid encounter within last 12 months    Recent Outpatient Visits          3 months ago Mold exposure   [facility] Volney American, [place]   3 months ago [place], [place], [place]   4 months ago Essential hypertension   [place], [place], DO   1 year ago Laryngopharyngeal reflux (LPR)   [place], Megan P, DO   1 year ago Chronic pain syndrome   [facility] Valerie Roys, DO      Future Appointments            In 1 month Tahiliani, Lennette Bihari, MD [place]         . hydrALAZINE (APRESOLINE) 50 MG tablet [Pharmacy Med Name: HYDRALAZINE 50MG  TAB] 90 tablet 0    Sig: TAKE 1 TABLET BY MOUTH THREE TIMES DAILY     Cardiovascular:  Vasodilators Passed - 08/22/2018 10:20 PM      Passed - HCT in normal range and within 360 days    Hematocrit  Date Value Ref Range Status  08/21/2018 36.5 34.0 - 46.6 % Final         Passed - HGB in normal range and within 360 days    Hemoglobin  Date Value Ref Range Status  08/21/2018 12.0 11.1 - 15.9 g/dL Final         Passed - RBC in normal range and within 360 days    RBC  Date Value Ref Range Status  08/21/2018 4.50 3.77 - 5.28 x10E6/uL Final  01/15/2018 4.25 3.80 - 5.20 MIL/uL Final         Passed - WBC in normal range and within 360 days    WBC  Date Value Ref Range Status  08/21/2018 7.6 3.4 - 10.8 x10E3/uL Final  01/15/2018 9.4 3.6 - 11.0 K/uL Final         Passed - PLT in normal range and within 360 days    Platelets  Date Value Ref Range Status  08/21/2018 362 150 - 450 x10E3/uL Final         Passed - Last BP in normal range   BP Readings from Last 1 Encounters:  08/16/17 (!) 172/82         Passed - Valid encounter within last 12 months    Recent Outpatient Visits          3 months ago Mold exposure   [place], [place], [place]   3 months ago [place], [place], [place]   4 months ago Essential hypertension   [place], [place], DO   1 year ago Laryngopharyngeal reflux (LPR)   [facility] [place], Megan P, DO   1 year ago Chronic pain syndrome   [facility] [place], [place], DO      Future Appointments            In 1 month Tahiliani, Lennette Bihari, MD [place]         . ANORO ELLIPTA 62.5-25 MCG/INH  AEPB [Pharmacy Med Name: ANORO ELLIPTA 53.5MCG-25MCG] 60 each 0    Sig: INHALE 1 PUFF BY MOUTH DAILY     Pulmonology:  Combination Products Passed - 08/22/2018 10:20 PM      Passed - Valid encounter within last 12 months    Recent Outpatient Visits          3 months ago Mold exposure   [facility] Volney American, [place]   3 months ago [place], [place], [place]   4 months ago Essential hypertension   [place], [place], DO   1 year ago Laryngopharyngeal reflux (LPR)   [place], DO   1 year ago Chronic pain syndrome   [facility] Valerie Roys, DO      Future Appointments            In 1 month Tahiliani, Lennette Bihari, MD [place]

## 2018-08-25 NOTE — Telephone Encounter (Signed)
-----  Message from Virgel Manifold, MD sent at 08/25/2018 10:33 AM EST ----- Which you be able to ask your office staff to call her and ask her not to do the replacement sample then, as we are planning on her screening colonoscopy anyway so Cologuard is not needed.  Thank you.  ----- Message ----- From: Valerie Roys, DO Sent: 08/22/2018   1:01 PM EST To: Virgel Manifold, MD  Hi Dr. Bonna Gains,  We did order a cologuard on her in May 2018, and she sent the sample back- but was alerted that it could not be processed and she did not send in a replacement. I'm not sure if she still has the box at her home for the replacement sample. I do not think they could have billed her for that as they did not get a result. I hope that helps.   ----- Message ----- From: Virgel Manifold, MD Sent: 08/22/2018  12:02 PM EST To: Valerie Roys, DO  Hello,  Thank you for referring this patient to me.  1.  In regard to her elevated alk phos, I repeated the lab and her GGT is completely normal.  Therefore the elevation in alk phos is not likely to be due to her liver.  May be related to bone as that is the other source of alk phos.  You can consider bone scan in this setting.  2.  She was also referred to me for colorectal cancer screening, which she is agreeable to a colonoscopy.  However, she states she recently had a stool test done at her primary care provider office.  We do not have any such results of the stool test.  In addition if the stool test was a colorectal cancer screening stool test such as a Cologuard or fecal occult blood test, this would affect the way the insurance will cover her procedure, as far as if it would be a diagnostic or preventative procedure.  Could you see what stool test she had done?  And I do not know if her insurance will cover both the stool test and the colonoscopy as preventative measures, and I do not know why both the referral and a stool test we have been  ordered at the same time.   We have her scheduled for a screening colonoscopy at this time, but depending on what stool test was ordered at your office, her insurance may not cover this completely.

## 2018-08-27 ENCOUNTER — Other Ambulatory Visit: Payer: Self-pay | Admitting: Obstetrics and Gynecology

## 2018-08-27 DIAGNOSIS — R0600 Dyspnea, unspecified: Secondary | ICD-10-CM

## 2018-09-01 DIAGNOSIS — R748 Abnormal levels of other serum enzymes: Secondary | ICD-10-CM | POA: Diagnosis not present

## 2018-09-01 DIAGNOSIS — R7309 Other abnormal glucose: Secondary | ICD-10-CM | POA: Diagnosis not present

## 2018-09-01 DIAGNOSIS — R0600 Dyspnea, unspecified: Secondary | ICD-10-CM | POA: Diagnosis not present

## 2018-09-01 DIAGNOSIS — R195 Other fecal abnormalities: Secondary | ICD-10-CM | POA: Diagnosis not present

## 2018-09-04 ENCOUNTER — Ambulatory Visit: Payer: Medicare Other | Attending: Obstetrics and Gynecology

## 2018-09-04 DIAGNOSIS — R0600 Dyspnea, unspecified: Secondary | ICD-10-CM | POA: Diagnosis not present

## 2018-09-04 MED ORDER — ALBUTEROL SULFATE (2.5 MG/3ML) 0.083% IN NEBU
2.5000 mg | INHALATION_SOLUTION | Freq: Once | RESPIRATORY_TRACT | Status: AC
  Administered 2018-09-04: 2.5 mg via RESPIRATORY_TRACT
  Filled 2018-09-04: qty 3

## 2018-10-08 ENCOUNTER — Encounter: Payer: Self-pay | Admitting: *Deleted

## 2018-10-09 ENCOUNTER — Ambulatory Visit: Payer: Medicare Other | Admitting: Gastroenterology

## 2018-10-09 ENCOUNTER — Telehealth: Payer: Self-pay

## 2018-10-09 NOTE — Telephone Encounter (Signed)
Pt comes by office to due to call stating to come of which an office appt was scheduled but not needed at this time. Was scheduled for a Colonoscopy/EGD today and did receive a call to came at 8:30 of which pt thought was here at office but was to be hospital more than likely. Pt states she has no memory of receiving the paperwork at her office visit. Patient rescheduled to 11/05/18 at [facility]. PREP instructions printed and went over these instructions by MO.

## 2018-11-01 DIAGNOSIS — C3490 Malignant neoplasm of unspecified part of unspecified bronchus or lung: Secondary | ICD-10-CM

## 2018-11-01 DIAGNOSIS — C189 Malignant neoplasm of colon, unspecified: Secondary | ICD-10-CM

## 2018-11-01 HISTORY — DX: Malignant neoplasm of colon, unspecified: C18.9

## 2018-11-01 HISTORY — DX: Malignant neoplasm of unspecified part of unspecified bronchus or lung: C34.90

## 2018-11-04 ENCOUNTER — Encounter: Payer: Self-pay | Admitting: Emergency Medicine

## 2018-11-05 ENCOUNTER — Ambulatory Visit: Payer: Medicare Other | Admitting: Certified Registered"

## 2018-11-05 ENCOUNTER — Encounter
Admission: RE | Disposition: A | Discharge status: Home / Self Care | Payer: Self-pay | Source: Home / Self Care | Attending: Gastroenterology

## 2018-11-05 ENCOUNTER — Other Ambulatory Visit: Payer: Self-pay

## 2018-11-05 ENCOUNTER — Ambulatory Visit
Admission: RE | Admit: 2018-11-05 | Discharge: 2018-11-05 | Disposition: A | Discharge status: Home / Self Care | Payer: Medicare Other | Attending: Gastroenterology | Admitting: Gastroenterology

## 2018-11-05 DIAGNOSIS — Z791 Long term (current) use of non-steroidal anti-inflammatories (NSAID): Secondary | ICD-10-CM | POA: Insufficient documentation

## 2018-11-05 DIAGNOSIS — D490 Neoplasm of unspecified behavior of digestive system: Secondary | ICD-10-CM | POA: Diagnosis not present

## 2018-11-05 DIAGNOSIS — K6289 Other specified diseases of anus and rectum: Secondary | ICD-10-CM | POA: Diagnosis not present

## 2018-11-05 DIAGNOSIS — K2270 Barrett's esophagus without dysplasia: Secondary | ICD-10-CM | POA: Insufficient documentation

## 2018-11-05 DIAGNOSIS — F419 Anxiety disorder, unspecified: Secondary | ICD-10-CM | POA: Insufficient documentation

## 2018-11-05 DIAGNOSIS — R21 Rash and other nonspecific skin eruption: Secondary | ICD-10-CM

## 2018-11-05 DIAGNOSIS — Z888 Allergy status to other drugs, medicaments and biological substances status: Secondary | ICD-10-CM | POA: Insufficient documentation

## 2018-11-05 DIAGNOSIS — K2289 Other specified disease of esophagus: Secondary | ICD-10-CM

## 2018-11-05 DIAGNOSIS — K921 Melena: Secondary | ICD-10-CM | POA: Diagnosis not present

## 2018-11-05 DIAGNOSIS — M48061 Spinal stenosis, lumbar region without neurogenic claudication: Secondary | ICD-10-CM | POA: Insufficient documentation

## 2018-11-05 DIAGNOSIS — G8929 Other chronic pain: Secondary | ICD-10-CM | POA: Insufficient documentation

## 2018-11-05 DIAGNOSIS — I10 Essential (primary) hypertension: Secondary | ICD-10-CM | POA: Diagnosis not present

## 2018-11-05 DIAGNOSIS — C187 Malignant neoplasm of sigmoid colon: Secondary | ICD-10-CM | POA: Insufficient documentation

## 2018-11-05 DIAGNOSIS — K3189 Other diseases of stomach and duodenum: Secondary | ICD-10-CM | POA: Diagnosis not present

## 2018-11-05 DIAGNOSIS — I129 Hypertensive chronic kidney disease with stage 1 through stage 4 chronic kidney disease, or unspecified chronic kidney disease: Secondary | ICD-10-CM | POA: Diagnosis not present

## 2018-11-05 DIAGNOSIS — F329 Major depressive disorder, single episode, unspecified: Secondary | ICD-10-CM | POA: Insufficient documentation

## 2018-11-05 DIAGNOSIS — K219 Gastro-esophageal reflux disease without esophagitis: Secondary | ICD-10-CM | POA: Insufficient documentation

## 2018-11-05 DIAGNOSIS — K6389 Other specified diseases of intestine: Secondary | ICD-10-CM

## 2018-11-05 DIAGNOSIS — Z1211 Encounter for screening for malignant neoplasm of colon: Secondary | ICD-10-CM | POA: Diagnosis not present

## 2018-11-05 DIAGNOSIS — K319 Disease of stomach and duodenum, unspecified: Secondary | ICD-10-CM | POA: Diagnosis not present

## 2018-11-05 DIAGNOSIS — K228 Other specified diseases of esophagus: Secondary | ICD-10-CM | POA: Diagnosis not present

## 2018-11-05 DIAGNOSIS — Z7951 Long term (current) use of inhaled steroids: Secondary | ICD-10-CM | POA: Insufficient documentation

## 2018-11-05 DIAGNOSIS — J45909 Unspecified asthma, uncomplicated: Secondary | ICD-10-CM | POA: Diagnosis not present

## 2018-11-05 DIAGNOSIS — Z79899 Other long term (current) drug therapy: Secondary | ICD-10-CM | POA: Insufficient documentation

## 2018-11-05 DIAGNOSIS — Z87891 Personal history of nicotine dependence: Secondary | ICD-10-CM | POA: Insufficient documentation

## 2018-11-05 DIAGNOSIS — N189 Chronic kidney disease, unspecified: Secondary | ICD-10-CM | POA: Diagnosis not present

## 2018-11-05 HISTORY — PX: COLONOSCOPY WITH PROPOFOL: SHX5780

## 2018-11-05 HISTORY — PX: ESOPHAGOGASTRODUODENOSCOPY (EGD) WITH PROPOFOL: SHX5813

## 2018-11-05 LAB — KOH PREP: Special Requests: NORMAL

## 2018-11-05 SURGERY — COLONOSCOPY WITH PROPOFOL
Anesthesia: General

## 2018-11-05 MED ORDER — ACETAMINOPHEN 500 MG PO TABS
ORAL_TABLET | ORAL | Status: AC
  Filled 2018-11-05: qty 1

## 2018-11-05 MED ORDER — FENTANYL CITRATE (PF) 100 MCG/2ML IJ SOLN
INTRAMUSCULAR | Status: DC | PRN
  Administered 2018-11-05: 25 ug via INTRAVENOUS

## 2018-11-05 MED ORDER — LABETALOL HCL 5 MG/ML IV SOLN
10.0000 mg | INTRAVENOUS | Status: DC | PRN
  Administered 2018-11-05 (×2): 10 mg via INTRAVENOUS
  Filled 2018-11-05 (×3): qty 4

## 2018-11-05 MED ORDER — EPHEDRINE SULFATE 50 MG/ML IJ SOLN
INTRAMUSCULAR | Status: AC
  Filled 2018-11-05: qty 1

## 2018-11-05 MED ORDER — GLYCOPYRROLATE 0.2 MG/ML IJ SOLN
INTRAMUSCULAR | Status: AC
  Filled 2018-11-05: qty 1

## 2018-11-05 MED ORDER — FENTANYL CITRATE (PF) 100 MCG/2ML IJ SOLN
INTRAMUSCULAR | Status: AC
  Filled 2018-11-05: qty 2

## 2018-11-05 MED ORDER — SODIUM CHLORIDE 0.9 % IV SOLN
INTRAVENOUS | Status: DC
  Administered 2018-11-05: 08:00:00 via INTRAVENOUS

## 2018-11-05 MED ORDER — GLYCOPYRROLATE 0.2 MG/ML IJ SOLN
INTRAMUSCULAR | Status: DC | PRN
  Administered 2018-11-05 (×2): 0.1 mg via INTRAVENOUS

## 2018-11-05 MED ORDER — ACETAMINOPHEN 500 MG PO TABS
1000.0000 mg | ORAL_TABLET | Freq: Four times a day (QID) | ORAL | Status: DC | PRN
  Administered 2018-11-05: 1000 mg via ORAL

## 2018-11-05 MED ORDER — EPHEDRINE SULFATE 50 MG/ML IJ SOLN
INTRAMUSCULAR | Status: DC | PRN
  Administered 2018-11-05 (×2): 5 mg via INTRAVENOUS

## 2018-11-05 MED ORDER — LIDOCAINE HCL (PF) 2 % IJ SOLN
INTRAMUSCULAR | Status: AC
  Filled 2018-11-05: qty 10

## 2018-11-05 MED ORDER — PROPOFOL 500 MG/50ML IV EMUL
INTRAVENOUS | Status: AC
  Filled 2018-11-05: qty 50

## 2018-11-05 MED ORDER — LABETALOL HCL 5 MG/ML IV SOLN
INTRAVENOUS | Status: AC
  Filled 2018-11-05: qty 4

## 2018-11-05 MED ORDER — PROPOFOL 500 MG/50ML IV EMUL
INTRAVENOUS | Status: DC | PRN
  Administered 2018-11-05: 100 ug/kg/min via INTRAVENOUS

## 2018-11-05 NOTE — Anesthesia Post-op Follow-up Note (Signed)
Anesthesia QCDR form completed.        

## 2018-11-05 NOTE — Op Note (Signed)
[facility] Patient Name: Gloria Allen Procedure Date: 11/05/2018 7:05 AM MRN: [id number] Account #: [id number] Date of Birth: 10-28-1945 Admit Type: Outpatient Age: 73 Room: [facility] ENDO ROOM 4 Gender: Female Note Status: Finalized Procedure:            Upper GI endoscopy Indications:          Melena, NSAID use Providers:            Angenette Daily B. Bonna Gains MD, MD Medicines:            Monitored Anesthesia Care Complications:        No immediate complications. Procedure:            Pre-Anesthesia Assessment:                       - Prior to the procedure, a History and Physical was                        performed, and patient medications, allergies and                        sensitivities were reviewed. The patient's tolerance of                        previous anesthesia was reviewed.                       - The risks and benefits of the procedure and the                        sedation options and risks were discussed with the                        patient. All questions were answered and informed                        consent was obtained.                       - Patient identification and proposed procedure were                        verified prior to the procedure by the physician, the                        nurse, the anesthesiologist, the anesthetist and the                        technician. The procedure was verified in the procedure                        room.                       - ASA Grade Assessment: II - A patient with mild                        systemic disease.                       After obtaining informed consent, the endoscope was  passed under direct vision. Throughout the procedure,                        the patient's blood pressure, pulse, and oxygen                        saturations were monitored continuously. The Endoscope                        was introduced through the mouth, and advanced to the                         second part of duodenum. The upper GI endoscopy was                        accomplished with ease. The patient tolerated the                        procedure well. Findings:      Scattered islands of salmon-colored mucosa were present. No other       visible abnormalities were present. The maximum longitudinal extent of       these esophageal mucosal changes was 0.5 cm in length. Mucosa was       biopsied with a cold forceps for histology in a targeted manner and in 4       quadrants. One specimen bottle was sent to pathology.      White nummular lesions were noted in the mid esophagus. Brushings for       KOH prep were obtained.      Patchy mildly erythematous mucosa without bleeding was found in the       gastric antrum. Biopsies were taken with a cold forceps for histology.       Biopsies were obtained in the gastric body, at the incisura and in the       gastric antrum with cold forceps for histology.      The duodenal bulb, second portion of the duodenum and examined duodenum       were normal. Impression:           - Salmon-colored mucosa suspicious for Barrett's                        esophagus. Biopsied.                       - White nummular lesions in esophageal mucosa.                        Brushings performed.                       - Erythematous mucosa in the antrum. Biopsied.                       - Normal duodenal bulb, second portion of the duodenum                        and examined duodenum.                       - Biopsies were obtained in the gastric body, at the  incisura and in the gastric antrum. Recommendation:       - Await pathology results.                       - Discharge patient to home (with escort).                       - Advance diet as tolerated.                       - Continue present medications.                       - Patient has a contact number available for                        emergencies. The  signs and symptoms of potential                        delayed complications were discussed with the patient.                        Return to normal activities tomorrow. Written discharge                        instructions were provided to the patient.                       - Discharge patient to home (with escort).                       - The findings and recommendations were discussed with                        the patient.                       - The findings and recommendations were discussed with                        the patient's family. Procedure Code(s):    --- Professional ---                       [id number], Esophagogastroduodenoscopy, flexible, transoral;                        with biopsy, single or multiple Diagnosis Code(s):    --- Professional ---                       K22.8, Other specified diseases of esophagus                       K31.89, Other diseases of stomach and duodenum                       K92.1, Melena (includes Hematochezia) CPT copyright 2018 American Medical Association. All rights reserved. The codes documented in this report are preliminary and upon coder review may  be revised to meet current compliance requirements.  Vonda Antigua, MD Margretta Sidle B. Bonna Gains MD, MD 11/05/2018 8:38:37 AM This report has been signed electronically. Number of Addenda: 0 Note Initiated On: 11/05/2018 7:05 AM  Estimated Blood Loss: Estimated blood loss: none.      [facility]

## 2018-11-05 NOTE — H&P (Signed)
Vonda Antigua, MD [address], [place], [place], [place], [address] [address], [place], [place], [place], [address] Phone: [phone]  Fax: [phone]  Primary Care Physician:  [facility] [facility]   Pre-Procedure History & Physical: HPI:  Gloria Allen is a 73 y.o. female is here for a colonoscopy and EGD.   Past Medical History:  Diagnosis Date  . Anxiety   . Arthritis   . Cervical central spinal stenosis (C3-C7) (worse at C4-5) 07/30/2017  . Cervical foraminal stenosis (C4-5 and C5-6) (Bilateral) (L>R) 07/30/2017  . Chronic lower extremity pain (Fourth Area of Pain) (Bilateral) (R>L) 06/11/2017  . Chronic neck pain (Primary Area of Pain) (Bilateral) (R>L) 06/11/2017  . Chronic sacroiliac joint pain (Bilateral) (R>L) 06/11/2017  . Chronic shoulder pain [facility] Area of Pain) (Right) 06/11/2017  . Chronic upper extremity pain (Fifth Area of Pain) (Bilateral) (R>L) 06/11/2017  . DDD (degenerative disc disease), cervical 07/30/2017  . DDD (degenerative disc disease), lumbar 07/30/2017  . Depression   . DISH (diffuse idiopathic skeletal hyperostosis) 07/30/2017  . Dyspnea    with exertion  . Entrapment syndrome 06/20/2017   2002 on R and 2010 on L  . Full thickness rotator cuff tear 06/12/2017  . GERD (gastroesophageal reflux disease)   . Grade 1 Anterolisthesis of L4 over L5 07/30/2017  . Headache   . Hx: UTI (urinary tract infection)   . Hypertension   . Inflammation of joint of shoulder region 01/15/2017  . Lumbar central spinal stenosis (L4-5) 07/30/2017  . Lumbar disc protrusion (Left: L5-S1) (Right: L1-2) 07/30/2017   L5-S1 left foraminal protrusion with L5 impingement. L1-2 right paracentral protrusion without impingement.  . Lumbar facet arthropathy (Bilateral) 07/30/2017  . Lumbar facet syndrome (Bilateral) (R>L) 07/30/2017  . Osteoarthritis of shoulder (Right) 01/15/2017    Past Surgical History:  Procedure Laterality Date  . ABDOMINAL  HYSTERECTOMY    . APPENDECTOMY    . BREAST REDUCTION SURGERY Bilateral   . DILATION AND CURETTAGE OF UTERUS    . REDUCTION MAMMAPLASTY    . SHOULDER ARTHROSCOPY WITH OPEN ROTATOR CUFF REPAIR AND DISTAL CLAVICLE ACROMINECTOMY Right 03/26/2017   Procedure: right shoulder arthroscopy, arthroscopic subacromial decompression, distal clavicle excision, mini open rotator cuff repair;  Surgeon: Thornton Park, MD;  Location: [facility] ORS;  Service: Orthopedics;  Laterality: Right;    Prior to Admission medications   Medication Sig Start Date End Date Taking? Authorizing Provider  albuterol (PROVENTIL HFA;VENTOLIN HFA) 108 (90 Base) MCG/ACT inhaler Inhale 2 puffs into the lungs every 6 (six) hours as needed for wheezing or shortness of breath. 04/02/18  Yes Kathrine Haddock, NP  ANORO ELLIPTA 62.5-25 MCG/INH AEPB INHALE 1 PUFF BY MOUTH DAILY 08/25/18  Yes Johnson, Megan P, DO  ARIPiprazole (ABILIFY) 10 MG tablet Take 1 tablet (10 mg total) by mouth daily. 05/05/18  Yes Volney American, PA-C  clonazePAM (KLONOPIN) 0.5 MG tablet Take 0.5 mg by mouth 3 (three) times daily as needed for anxiety.    Yes [provider]  cloNIDine (CATAPRES) 0.3 MG tablet Take 2 tablets (0.6 mg total) by mouth 2 (two) times daily. 05/19/18  Yes Volney American, PA-C  hydrALAZINE (APRESOLINE) 100 MG tablet Take 1 tablet (100 mg total) by mouth 3 (three) times daily. 04/22/18  Yes Johnson, Megan P, DO  hydrOXYzine (ATARAX/VISTARIL) 10 MG tablet TAKE 1 TABLET BY MOUTH THREE TIMES A DAY AS NEEDED 06/22/18  Yes Johnson, Megan P, DO  lovastatin (MEVACOR) 40 MG tablet Take 1  tablet (40 mg total) by mouth at bedtime. 04/24/18  Yes Johnson, Megan P, DO  methocarbamol (ROBAXIN) 500 MG tablet Take 1 tablet (500 mg total) by mouth at bedtime as needed for muscle spasms. 05/19/18  Yes Volney American, PA-C  metoprolol succinate (TOPROL-XL) 100 MG 24 hr tablet TAKE 1 TABLET BY MOUTH DAILY 06/22/18  Yes Johnson, Megan P, DO   mirtazapine (REMERON) 15 MG tablet take 1 tablet by mouth at bedtime for APPETITIE 04/24/18  Yes Johnson, Megan P, DO  montelukast (SINGULAIR) 10 MG tablet Take 1 tablet (10 mg total) by mouth at bedtime as needed. 04/02/18  Yes Kathrine Haddock, NP  nitrofurantoin (MACRODANTIN) 100 MG capsule Take 100 mg by mouth 2 (two) times daily.   Yes [provider]  omeprazole (PRILOSEC) 40 MG capsule Take 1 capsule (40 mg total) by mouth 2 (two) times daily. 04/24/18  Yes Johnson, Megan P, DO  oxybutynin (DITROPAN) 5 MG tablet TAKE 1 TABLET BY MOUTH TWICE DAILY 08/25/18  Yes Johnson, Megan P, DO  sertraline (ZOLOFT) 100 MG tablet Take 2 tablets (200 mg total) by mouth at bedtime. 04/24/18  Yes Johnson, Megan P, DO  traZODone (DESYREL) 50 MG tablet Take 0.5-1 tablets (25-50 mg total) by mouth at bedtime as needed for sleep. 04/24/18  Yes Johnson, Megan P, DO  triamcinolone cream (KENALOG) 0.1 % APPLY TOPICALLY TWICE DAILY AS NEEDED FOR ITCHING 06/22/18  Yes Johnson, Megan P, DO  ARIPiprazole (ABILIFY) 5 MG tablet TAKE 1 TABLET BY MOUTH DAILY Patient not taking: Reported on 08/21/2018 06/22/18   Park Liter P, DO  hydrALAZINE (APRESOLINE) 50 MG tablet TAKE 1 TABLET BY MOUTH THREE TIMES DAILY Patient not taking: Reported on 11/05/2018 08/25/18   Park Liter P, DO  terazosin (HYTRIN) 5 MG capsule TAKE 1 CAPSULE BY MOUTH ONCE DAILY AT BEDTIME Patient not taking: Reported on 08/21/2018 06/22/18   Park Liter P, DO    Allergies as of 08/22/2018 - Review Complete 08/21/2018  Allergen Reaction Noted  . Ace inhibitors Swelling 06/20/2017  . Angiotensin receptor blockers Swelling 06/20/2017  . Chlorthalidone Rash 04/22/2018  . Flexeril [cyclobenzaprine] Rash 04/22/2018    Family History  Problem Relation Age of Onset  . Hypertension Mother   . Breast cancer Mother 60  . Breast cancer Sister 70    Social History   Socioeconomic History  . Marital status: Divorced    Spouse name: Not on file  .  Number of children: Not on file  . Years of education: Not on file  . Highest education level: 11th grade  Occupational History  . Not on file  Social Needs  . Financial resource strain: Somewhat hard  . Food insecurity:    Worry: Sometimes true    Inability: Never true  . Transportation needs:    Medical: Yes    Non-medical: No  Tobacco Use  . Smoking status: Former Smoker    Packs/day: 1.00    Types: Cigarettes    Last attempt to quit: 2010    Years since quitting: 10.1  . Smokeless tobacco: Never Used  . Tobacco comment: quit over 30 years ago   Substance and Sexual Activity  . Alcohol use: No  . Drug use: No  . Sexual activity: Never  Lifestyle  . Physical activity:    Days per week: 0 days    Minutes per session: 0 min  . Stress: Not at all  Relationships  . Social connections:    Talks on phone:  Never    Gets together: Never    Attends religious service: More than 4 times per year    Active member of club or organization: No    Attends meetings of clubs or organizations: Never    Relationship status: Divorced  . Intimate partner violence:    Fear of current or ex partner: No    Emotionally abused: No    Physically abused: No    Forced sexual activity: No  Other Topics Concern  . Not on file  Social History Narrative  . Not on file    Review of Systems: See HPI, otherwise negative ROS  Physical Exam: BP (!) 194/63   Pulse (!) 47   Temp (!) 96.3 F (35.7 C) (Tympanic)   Resp 14   Ht 5\' 5"  (1.651 m)   Wt 72.1 kg   SpO2 100%   BMI 26.46 kg/m  General:   Alert,  pleasant and cooperative in NAD Head:  Normocephalic and atraumatic. Neck:  Supple; no masses or thyromegaly. Lungs:  Clear throughout to auscultation, normal respiratory effort.    Heart:  +S1, +S2, Regular rate and rhythm, No edema. Abdomen:  Soft, nontender and nondistended. Normal bowel sounds, without guarding, and without rebound.   Neurologic:  Alert and  oriented x4;  grossly  normal neurologically.  Impression/Plan: Gloria Allen is here for a colonoscopy to be performed for average risk screening and EGD for dark stool in the setting of NSAIDs. Pt reports drinking entire prep and having no solid stool output this AM.   Risks, benefits, limitations, and alternatives regarding the procedures have been reviewed with the patient.  Questions have been answered.  All parties agreeable.   Virgel Manifold, MD  11/05/2018, 8:16 AM

## 2018-11-05 NOTE — Transfer of Care (Signed)
Immediate Anesthesia Transfer of Care Note  Patient: Gloria Allen  Procedure(s) Performed: COLONOSCOPY WITH PROPOFOL (N/A ) ESOPHAGOGASTRODUODENOSCOPY (EGD) WITH PROPOFOL (N/A )  Patient Location: Endoscopy Unit  Anesthesia Type:General  Level of Consciousness: awake and drowsy  Airway & Oxygen Therapy: Patient Spontanous Breathing and Patient connected to nasal cannula oxygen  Post-op Assessment: Report given to RN and Post -op Vital signs reviewed and stable  Post vital signs: stable  Last Vitals:  Vitals Value Taken Time  BP 163/49 11/05/2018  8:57 AM  Temp 36.1 C 11/05/2018  8:56 AM  Pulse 57 11/05/2018  8:58 AM  Resp 18 11/05/2018  8:58 AM  SpO2 99 % 11/05/2018  8:58 AM  Vitals shown include unvalidated device data.  Last Pain:  Vitals:   11/05/18 0856  TempSrc: Tympanic  PainSc:          Complications: No apparent anesthesia complications

## 2018-11-05 NOTE — Progress Notes (Signed)
Dr. Ola Spurr treated hypertension in endo recovery. Headache much better. Pt. Agrees to take home blood pressure medicine upon arriving at home. Alert, oriented MAEX4

## 2018-11-05 NOTE — Anesthesia Preprocedure Evaluation (Addendum)
Anesthesia Evaluation  Patient identified by MRN, date of birth, ID band Patient awake    Reviewed: Allergy & Precautions, H&P , NPO status , Patient's Chart, lab work & pertinent test results  Airway Mallampati: III       Dental  (+) Upper Dentures, Lower Dentures   Pulmonary shortness of breath, asthma , former smoker,           Cardiovascular hypertension,   02/28/18 Pharmacological stress test: small perfusion abnormality of mild intensity present in the anterior region on the stress images.  Otherwise normal study   Neuro/Psych  Headaches, PSYCHIATRIC DISORDERS Anxiety Depression  Neuromuscular disease (entrapment syndrome)    GI/Hepatic Neg liver ROS, GERD  ,  Endo/Other  negative endocrine ROS  Renal/GU CRFRenal disease  negative genitourinary   Musculoskeletal  (+) Arthritis ,   Abdominal   Peds  Hematology negative hematology ROS (+)   Anesthesia Other Findings Reports sharp chest pain that comes and goes, lasts up to an hour at a time, unrelated to exertion.  Pt reports this is not new for her. She is a relatively poor historian.  Past Medical History: No date: Anxiety No date: Arthritis 07/30/2017: Cervical central spinal stenosis (C3-C7) (worse at C4-5) 07/30/2017: Cervical foraminal stenosis (C4-5 and C5-6) (Bilateral)  (L>R) 06/11/2017: Chronic lower extremity pain (Fourth Area of Pain)  (Bilateral) (R>L) 06/11/2017: Chronic neck pain (Primary Area of Pain) (Bilateral) (R>L) 06/11/2017: Chronic sacroiliac joint pain (Bilateral) (R>L) 06/11/2017: Chronic shoulder pain (Tertiary Area of Pain) (Right) 06/11/2017: Chronic upper extremity pain (Fifth Area of Pain)  (Bilateral) (R>L) 07/30/2017: DDD (degenerative disc disease), cervical 07/30/2017: DDD (degenerative disc disease), lumbar No date: Depression 07/30/2017: DISH (diffuse idiopathic skeletal hyperostosis) No date: Dyspnea     Comment:  with  exertion 06/20/2017: Entrapment syndrome     Comment:  2002 on R and 2010 on L 06/12/2017: Full thickness rotator cuff tear No date: GERD (gastroesophageal reflux disease) 07/30/2017: Grade 1 Anterolisthesis of L4 over L5 No date: Headache No date: Hx: UTI (urinary tract infection) No date: Hypertension 01/15/2017: Inflammation of joint of shoulder region 07/30/2017: Lumbar central spinal stenosis (L4-5) 07/30/2017: Lumbar disc protrusion (Left: L5-S1) (Right: L1-2)     Comment:  L5-S1 left foraminal protrusion with L5 impingement.               L1-2 right paracentral protrusion without impingement. 07/30/2017: Lumbar facet arthropathy (Bilateral) 07/30/2017: Lumbar facet syndrome (Bilateral) (R>L) 01/15/2017: Osteoarthritis of shoulder (Right)  Past Surgical History: No date: ABDOMINAL HYSTERECTOMY No date: APPENDECTOMY No date: BREAST REDUCTION SURGERY; Bilateral No date: DILATION AND CURETTAGE OF UTERUS No date: REDUCTION MAMMAPLASTY 03/26/2017: SHOULDER ARTHROSCOPY WITH OPEN ROTATOR CUFF REPAIR AND  DISTAL CLAVICLE ACROMINECTOMY; Right     Comment:  Procedure: right shoulder arthroscopy, arthroscopic               subacromial decompression, distal clavicle excision, mini              open rotator cuff repair;  Surgeon: Thornton Park, MD;              Location: [facility] ORS;  Service: Orthopedics;  Laterality:               Right;     Reproductive/Obstetrics negative OB ROS                            Anesthesia Physical Anesthesia Plan  ASA: III  Anesthesia Plan:  General   Post-op Pain Management:    Induction:   PONV Risk Score and Plan: Propofol infusion and TIVA  Airway Management Planned:   Additional Equipment:   Intra-op Plan:   Post-operative Plan:   Informed Consent: I have reviewed the patients History and Physical, chart, labs and discussed the procedure including the risks, benefits and alternatives for the proposed anesthesia  with the patient or authorized representative who has indicated his/her understanding and acceptance.     Dental Advisory Given  Plan Discussed with: Anesthesiologist and CRNA  Anesthesia Plan Comments:         Anesthesia Quick Evaluation

## 2018-11-05 NOTE — Op Note (Signed)
[facility] Patient Name: Gloria Allen Procedure Date: 11/05/2018 7:05 AM MRN: [id number] Account #: [id number] Date of Birth: May 14, 1946 Admit Type: Outpatient Age: 73 Room: [facility] ENDO ROOM 4 Gender: Female Note Status: Finalized Procedure:            Colonoscopy Indications:          Screening for colorectal malignant neoplasm Providers:            Tequilla Cousineau B. Bonna Gains MD, MD Referring MD:         Valerie Roys (Referring MD) Medicines:            Monitored Anesthesia Care Complications:        No immediate complications. Procedure:            Pre-Anesthesia Assessment:                       - Prior to the procedure, a History and Physical was                        performed, and patient medications, allergies and                        sensitivities were reviewed. The patient's tolerance of                        previous anesthesia was reviewed.                       - The risks and benefits of the procedure and the                        sedation options and risks were discussed with the                        patient. All questions were answered and informed                        consent was obtained.                       - Patient identification and proposed procedure were                        verified prior to the procedure by the physician, the                        nurse, the anesthesiologist, the anesthetist and the                        technician. The procedure was verified in the                        pre-procedure area in the procedure room in the                        endoscopy suite.                       - Prophylactic Antibiotics: The patient does not  require prophylactic antibiotics.                       - ASA Grade Assessment: II - A patient with mild                        systemic disease.                       - After reviewing the risks and benefits, the patient                        was  deemed in satisfactory condition to undergo the                        procedure.                       - Monitored anesthesia care was determined to be                        medically necessary for this procedure based on review                        of the patient's medical history, medications, and                        prior anesthesia history.                       - The anesthesia plan was to use monitored anesthesia                        care (MAC).                       After obtaining informed consent, the colonoscope was                        passed under direct vision. Throughout the procedure,                        the patient's blood pressure, pulse, and oxygen                        saturations were monitored continuously. The                        Colonoscope was introduced through the anus and                        advanced to the the sigmoid colon. The colonoscopy was                        performed with ease. The patient tolerated the                        procedure well. The quality of the bowel preparation                        was poor. Findings:      The perianal exam findings include a perianal rash.  The perianal exam findings include discoloration, hypopigmentation of       the perianal area.      A frond-like/villous non-obstructing large mass was found in the sigmoid       colon and at 12 cm proximal to the anus. The mass was partially       circumferential (involving one-half of the lumen circumference). No       bleeding was present. Mucosa was biopsied with a cold forceps for       histology. Impression:           - Preparation of the colon was poor.                       - Perianal rash found on perianal exam.                       - Discoloration, hypopigmentation of the perianal area.                        found on perianal exam.                       - Rule out malignancy, tumor in the sigmoid colon and                        at 12 cm  proximal to the anus. Biopsied. Recommendation:       - Await pathology results.                       - Return to my office in 1 week.                       - Repeat colonoscopy at the next available appointment                        because the bowel preparation was poor.                       - Refer to a surgeon, Dr. Dahlia Byes, due to colon mass.                       - Continue present medications.                       - Primary care provider to consider referral to                        dermatology due to finding on perianal exam mentioned                        above.                       - The findings and recommendations were discussed with                        the patient.                       - The findings and recommendations were discussed with  the patient's family. Procedure Code(s):    --- Professional ---                       [phone], 2, Colonoscopy, flexible; diagnostic, including                        collection of specimen(s) by brushing or washing, when                        performed (separate procedure) Diagnosis Code(s):    --- Professional ---                       Z12.11, Encounter for screening for malignant neoplasm                        of colon                       R21, Rash and other nonspecific skin eruption                       D49.0, Neoplasm of unspecified behavior of digestive                        system CPT copyright 2018 [employer]. All rights reserved. The codes documented in this report are preliminary and upon coder review may  be revised to meet current compliance requirements.  Vonda Antigua, MD Margretta Sidle B. Bonna Gains MD, MD 11/05/2018 9:00:11 AM This report has been signed electronically. Number of Addenda: 0 Note Initiated On: 11/05/2018 7:05 AM Total Procedure Duration: 0 hours 10 minutes 2 seconds  Estimated Blood Loss: Estimated blood loss: none.      [facility]

## 2018-11-05 NOTE — Discharge Instructions (Signed)
Colon Mass, Adult  A colon mass is an abnormal growth in the large intestine (colon). A mass can cause a blockage (obstruction) or bleeding in the colon. What are the causes? This condition may be caused by:  Cancer.  Blood vessel problems.  Infection.  Inflammatory bowel disease (IBD).  An inflammation or infection of small pouches in the colon (diverticulitis).  A condition in which the lining of the uterus grows outside of the uterus (endometriosis). What are the signs or symptoms? Symptoms of this condition include:  Cramping.  Nausea.  Diarrhea.  Fever.  Vomiting.  Feeling weak.  Pain in the abdomen, side, or back.  Weight loss.  Constipation.  Bleeding from the rectum.  Loss of appetite.  Changes in bowel habits.  Feeling the need for a bowel movement after you have had one recently. In some cases, there are no symptoms. How is this diagnosed?     This condition may be diagnosed based on tests and procedures that are done to learn more about the mass. These may include:  Blood tests.  X-rays.  Ultrasound.  CT scan.  MRI.  Colonoscopy. This is a procedure in which a lubricated, flexible tube is inserted into the anus and then passed into the rectum and colon to examine the areas.  Removal of a tissue sample from the mass to be examined (biopsy). A biopsy may be taken during a colonoscopy. In some cases, what seems like a colon mass may actually be something else, such as scarring that formed after a surgery or an ulcer. How is this treated? Treatment depends on the cause of the mass. You and your health care provider will discuss the meaning of your test results and the recommended steps for starting treatment. If your case is serious, you may need emergency surgery. Follow these instructions at home: What you need to do at home depends on the cause of the mass. Follow instructions from your health care provider. In general:  Take  over-the-counter and prescription medicines only as told by your health care provider.  Keep all follow-up visits as told by your health care provider. This is important. Contact a health care provider if:  Your symptoms get worse.  You have new symptoms.  You have a fever.  Your pain does not get better with medicine.  You feel weaker.  You bruise or bleed easily. Get help right away if:  You vomit bright red blood or material that looks like coffee grounds.  You have blood in your stools (feces), or your stools look black and tarry.  You faint.  You feel that the mass has suddenly gotten larger.  You have severe swelling or pressure in your abdomen (bloating). Summary  A colon mass is an abnormal growth in the large intestine (colon). There are many possible causes of a colon mass.  Treatment depends on the cause of the mass. If your case is serious, you may need emergency surgery.  Take over-the-counter and prescription medicines only as told by your health care provider. This information is not intended to replace advice given to you by your health care provider. Make sure you discuss any questions you have with your health care provider. Document Released: 12/25/2006 Document Revised: 07/08/2017 Document Reviewed: 07/08/2017 Elsevier Interactive Patient Education  2019 [employer]. Barrett's Esophagus  Barrett's esophagus occurs when the tissue that lines the esophagus changes or becomes damaged. The esophagus is the tube that carries food from the throat to the stomach. With  Barrett's esophagus, the cells that line the esophagus are replaced by cells that are similar to the lining of the intestines (intestinal metaplasia). Barrett's esophagus itself may not cause any symptoms. However, many people who have Barrett's esophagus also have gastroesophageal reflux disease (GERD), which may cause symptoms such as heartburn. Over time, a few people with this condition may  develop cancer of the esophagus. Treatment may include medicines, procedures to destroy the abnormal cells, or surgery. What are the causes? The exact cause of this condition is not known. In some cases, the condition develops from damage to the lining of the esophagus caused by GERD. GERD occurs when stomach acids flow up from the stomach into the esophagus. Frequent symptoms of GERD may cause intestinal metaplasia or cause cell changes (dysplasia). What increases the risk? You are more likely to develop this condition if you:  Have GERD.  Are female.  Are Caucasian.  Are obese.  Are older than 50.  Have a hiatal hernia. This is a condition in which part of your stomach bulges into your chest.  Smoke. What are the signs or symptoms? People with Barrett's esophagus often have no symptoms. However, many people with this condition also have GERD. Symptoms of GERD may include:  Heartburn.  Difficulty swallowing.  Dry cough. How is this diagnosed? This condition may be diagnosed based on:  Results of an upper gastrointestinal endoscopy. For this exam, a thin, flexible tube with a light and a camera on the end (endoscope) is passed down your esophagus. Your health care provider can view the inside of your esophagus during this procedure.  Results of a biopsy. For this procedure, several tissue samples are removed (biopsy) from your esophagus. They are then checked for intestinal metaplasia or dysplasia. How is this treated? Treatment for this condition may include:  Medicines (proton pump inhibitors, or PPIs) to decrease or stop GERD.  Periodic endoscopic exams to make sure that cancer is not developing.  A procedure or surgery for dysplasia. This may include: ? Removal or destruction of abnormal cells. ? Removal of part of the esophagus (esophagectomy). Follow these instructions at home: Eating and drinking  Eat more fruits and vegetables.  Avoid fatty foods.  Eat small,  frequent meals instead of large meals.  Avoid foods that cause heartburn. These foods include: ? Coffee and alcoholic drinks. ? Tomatoes and foods made with tomatoes. ? Greasy or spicy foods. ? Chocolate and peppermint.  Do not drink alcohol. General instructions  Take over-the-counter and prescription medicines only as told by your health care provider.  Do not use any products that contain nicotine or tobacco, such as cigarettes and e-cigarettes. If you need help quitting, ask your health care provider.  If you are being treated for GERD, make sure you take medicines and follow all instructions as told by your health care provider.  Keep all follow-up visits as told by your health care provider. This is important. Contact a health care provider if:  You have heartburn or GERD symptoms.  You have difficulty swallowing. Get help right away if:  You have chest pain.  You are unable to swallow.  You vomit blood or material that looks like coffee grounds.  Your stool (feces) is bright red or dark. Summary  Barrett's esophagus occurs when the tissue that lines the esophagus changes or becomes damaged.  Barrett's esophagus may be diagnosed with an upper gastrointestinal endoscopy and a biopsy.  Treatment may include medicines, procedures to remove abnormal cells, or  surgery.  Follow your health care provider's instructions about what to eat and drink, what medicines to take, and when to call for help. This information is not intended to replace advice given to you by your health care provider. Make sure you discuss any questions you have with your health care provider. Document Released: 12/08/2003 Document Revised: 01/13/2018 Document Reviewed: 01/13/2018 Elsevier Interactive Patient Education  2019 [employer].

## 2018-11-06 ENCOUNTER — Other Ambulatory Visit: Payer: Self-pay

## 2018-11-06 ENCOUNTER — Encounter: Payer: Self-pay | Admitting: *Deleted

## 2018-11-06 ENCOUNTER — Other Ambulatory Visit: Payer: Self-pay | Admitting: Anatomic Pathology & Clinical Pathology

## 2018-11-06 ENCOUNTER — Ambulatory Visit
Admission: RE | Admit: 2018-11-06 | Discharge: 2018-11-06 | Disposition: A | Discharge status: Home / Self Care | Payer: Medicare Other | Attending: Gastroenterology | Admitting: Gastroenterology

## 2018-11-06 ENCOUNTER — Ambulatory Visit: Payer: Medicare Other | Admitting: Anesthesiology

## 2018-11-06 ENCOUNTER — Encounter
Admission: RE | Disposition: A | Discharge status: Home / Self Care | Payer: Self-pay | Source: Home / Self Care | Attending: Gastroenterology

## 2018-11-06 DIAGNOSIS — K635 Polyp of colon: Secondary | ICD-10-CM | POA: Diagnosis not present

## 2018-11-06 DIAGNOSIS — D122 Benign neoplasm of ascending colon: Secondary | ICD-10-CM

## 2018-11-06 DIAGNOSIS — I10 Essential (primary) hypertension: Secondary | ICD-10-CM | POA: Diagnosis not present

## 2018-11-06 DIAGNOSIS — Z79899 Other long term (current) drug therapy: Secondary | ICD-10-CM | POA: Diagnosis not present

## 2018-11-06 DIAGNOSIS — F419 Anxiety disorder, unspecified: Secondary | ICD-10-CM | POA: Diagnosis not present

## 2018-11-06 DIAGNOSIS — K6389 Other specified diseases of intestine: Secondary | ICD-10-CM

## 2018-11-06 DIAGNOSIS — K219 Gastro-esophageal reflux disease without esophagitis: Secondary | ICD-10-CM | POA: Insufficient documentation

## 2018-11-06 DIAGNOSIS — F329 Major depressive disorder, single episode, unspecified: Secondary | ICD-10-CM | POA: Insufficient documentation

## 2018-11-06 DIAGNOSIS — Z87891 Personal history of nicotine dependence: Secondary | ICD-10-CM | POA: Insufficient documentation

## 2018-11-06 DIAGNOSIS — C187 Malignant neoplasm of sigmoid colon: Secondary | ICD-10-CM | POA: Diagnosis not present

## 2018-11-06 DIAGNOSIS — Z1211 Encounter for screening for malignant neoplasm of colon: Secondary | ICD-10-CM | POA: Diagnosis not present

## 2018-11-06 HISTORY — PX: COLONOSCOPY WITH PROPOFOL: SHX5780

## 2018-11-06 LAB — SURGICAL PATHOLOGY

## 2018-11-06 SURGERY — COLONOSCOPY WITH PROPOFOL
Anesthesia: General

## 2018-11-06 MED ORDER — SPOT INK MARKER SYRINGE KIT
PACK | SUBMUCOSAL | Status: DC | PRN
  Administered 2018-11-06: 4 mL via SUBMUCOSAL

## 2018-11-06 MED ORDER — PROPOFOL 500 MG/50ML IV EMUL
INTRAVENOUS | Status: DC | PRN
  Administered 2018-11-06: 145 ug/kg/min via INTRAVENOUS

## 2018-11-06 MED ORDER — PROPOFOL 10 MG/ML IV BOLUS
INTRAVENOUS | Status: AC
  Filled 2018-11-06: qty 20

## 2018-11-06 MED ORDER — SODIUM CHLORIDE 0.9 % IV SOLN
INTRAVENOUS | Status: DC
  Administered 2018-11-06: 10:00:00 via INTRAVENOUS

## 2018-11-06 MED ORDER — PROPOFOL 10 MG/ML IV BOLUS
INTRAVENOUS | Status: DC | PRN
  Administered 2018-11-06: 50 mg via INTRAVENOUS

## 2018-11-06 MED ORDER — PROPOFOL 10 MG/ML IV BOLUS
INTRAVENOUS | Status: AC
  Filled 2018-11-06: qty 40

## 2018-11-06 MED ORDER — HYDRALAZINE HCL 20 MG/ML IJ SOLN
INTRAMUSCULAR | Status: DC | PRN
  Administered 2018-11-06: 2.5 mg via INTRAVENOUS

## 2018-11-06 NOTE — Anesthesia Post-op Follow-up Note (Signed)
Anesthesia QCDR form completed.        

## 2018-11-06 NOTE — Op Note (Signed)
[facility] Gastroenterology Patient Name: Nastassja Witkop Procedure Date: 11/06/2018 10:09 AM MRN: [id number] Account #: [id number] Date of Birth: [birth year] Admit Type: Outpatient Age: 73 Room: [facility] ENDO ROOM 2 Gender: Female Note Status: Finalized Procedure:            Colonoscopy Indications:          Screening for colorectal malignant neoplasm Providers:            Varnita B. Bonna Gains MD, MD Referring MD:         Forest Gleason Md, MD (Referring MD) Medicines:            Monitored Anesthesia Care Complications:        No immediate complications. Procedure:            Pre-Anesthesia Assessment:                       - ASA Grade Assessment: II - A patient with mild                        systemic disease.                       - Prior to the procedure, a History and Physical was                        performed, and patient medications, allergies and                        sensitivities were reviewed. The patient's tolerance of                        previous anesthesia was reviewed.                       - The risks and benefits of the procedure and the                        sedation options and risks were discussed with the                        patient. All questions were answered and informed                        consent was obtained.                       - Patient identification and proposed procedure were                        verified prior to the procedure by the physician, the                        nurse, the anesthesiologist, the anesthetist and the                        technician. The procedure was verified in the procedure                        room.  After obtaining informed consent, the colonoscope was                        passed under direct vision. Throughout the procedure,                        the patient's blood pressure, pulse, and oxygen                        saturations were monitored continuously. The              Colonoscope was introduced through the anus and                        advanced to the the cecum, identified by appendiceal                        orifice and ileocecal valve. The colonoscopy was                        performed with ease. The patient tolerated the                        procedure well. The quality of the bowel preparation                        was fair. Large amount of seeds and material that                        clogged the scope seen. Findings:      The perianal and digital rectal examinations were normal.      A 8 mm polyp was found in the ascending colon. The polyp was flat. The       polyp was removed with a cold snare. Resection and retrieval were       complete. To prevent bleeding after the polypectomy, one hemostatic clip       was successfully placed. There was no bleeding at the end of the       procedure.      A non-obstructing large mass was found in the sigmoid colon. The mass       was non-circumferential. No bleeding was present. Area was tattooed with       an injection of Spot (carbon black). The mass was biopsied yesterday and       showed adenocarcinoma. Tattoo was placed one fold proximal and one fold       distal to the mass.      A patchy area of mildly melanotic mucosa was found in the sigmoid colon.       Biopsies were taken with a cold forceps for histology.      The exam was otherwise without abnormality.      Retroflexion in the rectum was not performed due to narrow rectum.       Frontal view did not reveal any abnormalities. Impression:           - One 8 mm polyp in the ascending colon, removed with a                        cold snare. Resected and retrieved. Clip was placed.                       -  Malignant tumor in the sigmoid colon. Tattooed.                       - Melanotic mucosa in the sigmoid colon. Biopsied.                       - The examination was otherwise normal.                       - Due to the prep, small  or flat lesions or polyps may                        have not been seen despite extensive cleaning with                        water and suctioning. Thus an earlier colonoscopy is                        recommended. Recommendation:       - Discharge patient to home (with escort).                       - Advance diet as tolerated.                       - Continue present medications.                       - Await pathology results.                       - Repeat colonoscopy in 1 year.                       - The findings and recommendations were discussed with                        the patient.                       - The findings and recommendations were discussed with                        the patient's family.                       - Return to primary care physician as previously                        scheduled.                       - Return to my office in 2 weeks.                       - Refer to an oncologist.                       - Refer to a surgeon. Procedure Code(s):    --- Professional ---                       [phone], Colonoscopy, flexible; with removal of tumor(s),  polyp(s), or other lesion(s) by snare technique                       45381, Colonoscopy, flexible; with directed submucosal                        injection(s), any substance                       45380, 40, Colonoscopy, flexible; with biopsy, single                        or multiple Diagnosis Code(s):    --- Professional ---                       Z12.11, Encounter for screening for malignant neoplasm                        of colon                       D12.2, Benign neoplasm of ascending colon                       C18.7, Malignant neoplasm of sigmoid colon                       K63.89, Other specified diseases of intestine CPT copyright 2018 American Medical Association. All rights reserved. The codes documented in this report are preliminary and upon coder review may  be revised  to meet current compliance requirements.  Vonda Antigua, MD Margretta Sidle B. Bonna Gains MD, MD 11/06/2018 11:14:49 AM This report has been signed electronically. Number of Addenda: 0 Note Initiated On: 11/06/2018 10:09 AM Scope Withdrawal Time: 0 hours 24 minutes 26 seconds  Total Procedure Duration: 0 hours 34 minutes 4 seconds  Estimated Blood Loss: Estimated blood loss: none.      [facility]

## 2018-11-06 NOTE — Transfer of Care (Signed)
Immediate Anesthesia Transfer of Care Note  Patient: Gloria Allen  Procedure(s) Performed: COLONOSCOPY WITH PROPOFOL (N/A )  Patient Location: PACU  Anesthesia Type:General  Level of Consciousness: drowsy  Airway & Oxygen Therapy: Patient Spontanous Breathing and Patient connected to nasal cannula oxygen  Post-op Assessment: Report given to RN  Post vital signs: stable  Last Vitals:  Vitals Value Taken Time  BP 165/63 11/06/2018 11:11 AM  Temp 36.1 C 11/06/2018 11:10 AM  Pulse 47 11/06/2018 11:12 AM  Resp 21 11/06/2018 11:12 AM  SpO2 100 % 11/06/2018 11:12 AM  Vitals shown include unvalidated device data.  Last Pain:  Vitals:   11/06/18 1110  TempSrc: Tympanic  PainSc: Asleep         Complications: No apparent anesthesia complications

## 2018-11-06 NOTE — Anesthesia Preprocedure Evaluation (Signed)
Anesthesia Evaluation  Patient identified by MRN, date of birth, ID band Patient awake    Reviewed: Allergy & Precautions, H&P , NPO status , Patient's Chart, lab work & pertinent test results, reviewed documented beta blocker date and time   Airway Mallampati: II   Neck ROM: full    Dental  (+) Poor Dentition   Pulmonary neg pulmonary ROS, shortness of breath, asthma , former smoker,    Pulmonary exam normal        Cardiovascular Exercise Tolerance: Good hypertension, On Medications negative cardio ROS Normal cardiovascular exam Rhythm:regular Rate:Normal     Neuro/Psych  Headaches,  Neuromuscular disease negative neurological ROS  negative psych ROS   GI/Hepatic negative GI ROS, Neg liver ROS, GERD  Medicated,  Endo/Other  negative endocrine ROS  Renal/GU Renal diseasenegative Renal ROS  negative genitourinary   Musculoskeletal   Abdominal   Peds  Hematology negative hematology ROS (+)   Anesthesia Other Findings Past Medical History: No date: Anxiety No date: Arthritis 07/30/2017: Cervical central spinal stenosis (C3-C7) (worse at C4-5) 07/30/2017: Cervical foraminal stenosis (C4-5 and C5-6) (Bilateral)  (L>R) 06/11/2017: Chronic lower extremity pain (Fourth Area of Pain)  (Bilateral) (R>L) 06/11/2017: Chronic neck pain (Primary Area of Pain) (Bilateral) (R>L) 06/11/2017: Chronic sacroiliac joint pain (Bilateral) (R>L) 06/11/2017: Chronic shoulder pain (Tertiary Area of Pain) (Right) 06/11/2017: Chronic upper extremity pain (Fifth Area of Pain)  (Bilateral) (R>L) 07/30/2017: DDD (degenerative disc disease), cervical 07/30/2017: DDD (degenerative disc disease), lumbar No date: Depression 07/30/2017: DISH (diffuse idiopathic skeletal hyperostosis) No date: Dyspnea     Comment:  with exertion 06/20/2017: Entrapment syndrome     Comment:  2002 on R and 2010 on L 06/12/2017: Full thickness rotator cuff tear No  date: GERD (gastroesophageal reflux disease) 07/30/2017: Grade 1 Anterolisthesis of L4 over L5 No date: Headache No date: Hx: UTI (urinary tract infection) No date: Hypertension 01/15/2017: Inflammation of joint of shoulder region 07/30/2017: Lumbar central spinal stenosis (L4-5) 07/30/2017: Lumbar disc protrusion (Left: L5-S1) (Right: L1-2)     Comment:  L5-S1 left foraminal protrusion with L5 impingement.               L1-2 right paracentral protrusion without impingement. 07/30/2017: Lumbar facet arthropathy (Bilateral) 07/30/2017: Lumbar facet syndrome (Bilateral) (R>L) 01/15/2017: Osteoarthritis of shoulder (Right) Past Surgical History: No date: ABDOMINAL HYSTERECTOMY No date: APPENDECTOMY No date: BREAST REDUCTION SURGERY; Bilateral 11/05/2018: COLONOSCOPY WITH PROPOFOL; N/A     Comment:  Procedure: COLONOSCOPY WITH PROPOFOL;  Surgeon:               Virgel Manifold, MD;  Location: [facility] ENDOSCOPY;                Service: Endoscopy;  Laterality: N/A; No date: DILATION AND CURETTAGE OF UTERUS 11/05/2018: ESOPHAGOGASTRODUODENOSCOPY (EGD) WITH PROPOFOL; N/A     Comment:  Procedure: ESOPHAGOGASTRODUODENOSCOPY (EGD) WITH               PROPOFOL;  Surgeon: Virgel Manifold, MD;  Location:               [facility] ENDOSCOPY;  Service: Endoscopy;  Laterality: N/A; No date: REDUCTION MAMMAPLASTY 03/26/2017: SHOULDER ARTHROSCOPY WITH OPEN ROTATOR CUFF REPAIR AND  DISTAL CLAVICLE ACROMINECTOMY; Right     Comment:  Procedure: right shoulder arthroscopy, arthroscopic               subacromial decompression, distal clavicle excision, mini              open  rotator cuff repair;  Surgeon: Thornton Park, MD;              Location: [facility] ORS;  Service: Orthopedics;  Laterality:               Right; BMI    Body Mass Index:  26.46 kg/m     Reproductive/Obstetrics negative OB ROS                             Anesthesia Physical Anesthesia Plan  ASA: III  Anesthesia  Plan: General   Post-op Pain Management:    Induction:   PONV Risk Score and Plan:   Airway Management Planned:   Additional Equipment:   Intra-op Plan:   Post-operative Plan:   Informed Consent: I have reviewed the patients History and Physical, chart, labs and discussed the procedure including the risks, benefits and alternatives for the proposed anesthesia with the patient or authorized representative who has indicated his/her understanding and acceptance.     Dental Advisory Given  Plan Discussed with: CRNA  Anesthesia Plan Comments:         Anesthesia Quick Evaluation

## 2018-11-06 NOTE — Progress Notes (Unsigned)
MDT

## 2018-11-06 NOTE — H&P (Signed)
Vonda Antigua, MD [address] [place], [place], [place], [address] [address], [place], [place], [place], [address] Phone: [phone]  Fax: [phone]  Primary Care Physician:  [facility]   Pre-Procedure History & Physical: HPI:  Gloria Allen is a 73 y.o. female is here for a colonoscopy.   Past Medical History:  Diagnosis Date  . Anxiety   . Arthritis   . Cervical central spinal stenosis (C3-C7) (worse at C4-5) 07/30/2017  . Cervical foraminal stenosis (C4-5 and C5-6) (Bilateral) (L>R) 07/30/2017  . Chronic lower extremity pain (Fourth Area of Pain) (Bilateral) (R>L) 06/11/2017  . Chronic neck pain (Primary Area of Pain) (Bilateral) (R>L) 06/11/2017  . Chronic sacroiliac joint pain (Bilateral) (R>L) 06/11/2017  . Chronic shoulder pain [facility] Area of Pain) (Right) 06/11/2017  . Chronic upper extremity pain (Fifth Area of Pain) (Bilateral) (R>L) 06/11/2017  . DDD (degenerative disc disease), cervical 07/30/2017  . DDD (degenerative disc disease), lumbar 07/30/2017  . Depression   . DISH (diffuse idiopathic skeletal hyperostosis) 07/30/2017  . Dyspnea    with exertion  . Entrapment syndrome 06/20/2017   2002 on R and 2010 on L  . Full thickness rotator cuff tear 06/12/2017  . GERD (gastroesophageal reflux disease)   . Grade 1 Anterolisthesis of L4 over L5 07/30/2017  . Headache   . Hx: UTI (urinary tract infection)   . Hypertension   . Inflammation of joint of shoulder region 01/15/2017  . Lumbar central spinal stenosis (L4-5) 07/30/2017  . Lumbar disc protrusion (Left: L5-S1) (Right: L1-2) 07/30/2017   L5-S1 left foraminal protrusion with L5 impingement. L1-2 right paracentral protrusion without impingement.  . Lumbar facet arthropathy (Bilateral) 07/30/2017  . Lumbar facet syndrome (Bilateral) (R>L) 07/30/2017  . Osteoarthritis of shoulder (Right) 01/15/2017    Past Surgical History:  Procedure Laterality Date  . ABDOMINAL HYSTERECTOMY     . APPENDECTOMY    . BREAST REDUCTION SURGERY Bilateral   . COLONOSCOPY WITH PROPOFOL N/A 11/05/2018   Procedure: COLONOSCOPY WITH PROPOFOL;  Surgeon: Virgel Manifold, MD;  Location: [facility] ENDOSCOPY;  Service: Endoscopy;  Laterality: N/A;  . DILATION AND CURETTAGE OF UTERUS    . ESOPHAGOGASTRODUODENOSCOPY (EGD) WITH PROPOFOL N/A 11/05/2018   Procedure: ESOPHAGOGASTRODUODENOSCOPY (EGD) WITH PROPOFOL;  Surgeon: Virgel Manifold, MD;  Location: [facility] ENDOSCOPY;  Service: Endoscopy;  Laterality: N/A;  . REDUCTION MAMMAPLASTY    . SHOULDER ARTHROSCOPY WITH OPEN ROTATOR CUFF REPAIR AND DISTAL CLAVICLE ACROMINECTOMY Right 03/26/2017   Procedure: right shoulder arthroscopy, arthroscopic subacromial decompression, distal clavicle excision, mini open rotator cuff repair;  Surgeon: Thornton Park, MD;  Location: [facility] ORS;  Service: Orthopedics;  Laterality: Right;    Prior to Admission medications   Medication Sig Start Date End Date Taking? Authorizing Provider  albuterol (PROVENTIL HFA;VENTOLIN HFA) 108 (90 Base) MCG/ACT inhaler Inhale 2 puffs into the lungs every 6 (six) hours as needed for wheezing or shortness of breath. 04/02/18  Yes Kathrine Haddock, NP  ANORO ELLIPTA 62.5-25 MCG/INH AEPB INHALE 1 PUFF BY MOUTH DAILY 08/25/18  Yes Johnson, Megan P, DO  ARIPiprazole (ABILIFY) 10 MG tablet Take 1 tablet (10 mg total) by mouth daily. 05/05/18  Yes Volney American, PA-C  ARIPiprazole (ABILIFY) 5 MG tablet TAKE 1 TABLET BY MOUTH DAILY 06/22/18  Yes Johnson, Megan P, DO  clonazePAM (KLONOPIN) 0.5 MG tablet Take 0.5 mg by mouth 3 (three) times daily as needed for anxiety.    Yes [provider]  cloNIDine (CATAPRES) 0.3 MG  tablet Take 2 tablets (0.6 mg total) by mouth 2 (two) times daily. 05/19/18  Yes Volney American, PA-C  hydrALAZINE (APRESOLINE) 100 MG tablet Take 1 tablet (100 mg total) by mouth 3 (three) times daily. 04/22/18  Yes Johnson, Megan P, DO  hydrALAZINE (APRESOLINE) 50  MG tablet TAKE 1 TABLET BY MOUTH THREE TIMES DAILY 08/25/18  Yes Johnson, Megan P, DO  hydrOXYzine (ATARAX/VISTARIL) 10 MG tablet TAKE 1 TABLET BY MOUTH THREE TIMES A DAY AS NEEDED 06/22/18  Yes Johnson, Megan P, DO  lovastatin (MEVACOR) 40 MG tablet Take 1 tablet (40 mg total) by mouth at bedtime. 04/24/18  Yes Johnson, Megan P, DO  methocarbamol (ROBAXIN) 500 MG tablet Take 1 tablet (500 mg total) by mouth at bedtime as needed for muscle spasms. 05/19/18  Yes Volney American, PA-C  metoprolol succinate (TOPROL-XL) 100 MG 24 hr tablet TAKE 1 TABLET BY MOUTH DAILY 06/22/18  Yes Johnson, Megan P, DO  mirtazapine (REMERON) 15 MG tablet take 1 tablet by mouth at bedtime for APPETITIE 04/24/18  Yes Johnson, Megan P, DO  montelukast (SINGULAIR) 10 MG tablet Take 1 tablet (10 mg total) by mouth at bedtime as needed. 04/02/18  Yes Kathrine Haddock, NP  nitrofurantoin (MACRODANTIN) 100 MG capsule Take 100 mg by mouth 2 (two) times daily.   Yes [provider]  omeprazole (PRILOSEC) 40 MG capsule Take 1 capsule (40 mg total) by mouth 2 (two) times daily. 04/24/18  Yes Johnson, Megan P, DO  oxybutynin (DITROPAN) 5 MG tablet TAKE 1 TABLET BY MOUTH TWICE DAILY 08/25/18  Yes Johnson, Megan P, DO  sertraline (ZOLOFT) 100 MG tablet Take 2 tablets (200 mg total) by mouth at bedtime. 04/24/18  Yes Johnson, Megan P, DO  traZODone (DESYREL) 50 MG tablet Take 0.5-1 tablets (25-50 mg total) by mouth at bedtime as needed for sleep. 04/24/18  Yes Johnson, Megan P, DO  triamcinolone cream (KENALOG) 0.1 % APPLY TOPICALLY TWICE DAILY AS NEEDED FOR ITCHING 06/22/18  Yes Johnson, Megan P, DO  terazosin (HYTRIN) 5 MG capsule TAKE 1 CAPSULE BY MOUTH ONCE DAILY AT BEDTIME Patient not taking: Reported on 08/21/2018 06/22/18   Park Liter P, DO    Allergies as of 11/05/2018 - Review Complete 11/05/2018  Allergen Reaction Noted  . Ace inhibitors Swelling 06/20/2017  . Angiotensin receptor blockers Swelling 06/20/2017  .  Chlorthalidone Rash 04/22/2018  . Flexeril [cyclobenzaprine] Rash 04/22/2018    Family History  Problem Relation Age of Onset  . Hypertension Mother   . Breast cancer Mother 34  . Breast cancer Sister 33    Social History   Socioeconomic History  . Marital status: Divorced    Spouse name: Not on file  . Number of children: Not on file  . Years of education: Not on file  . Highest education level: 11th grade  Occupational History  . Not on file  Social Needs  . Financial resource strain: Somewhat hard  . Food insecurity:    Worry: Sometimes true    Inability: Never true  . Transportation needs:    Medical: Yes    Non-medical: No  Tobacco Use  . Smoking status: Former Smoker    Packs/day: 1.00    Types: Cigarettes    Last attempt to quit: 2010    Years since quitting: 10.1  . Smokeless tobacco: Never Used  . Tobacco comment: quit over 30 years ago   Substance and Sexual Activity  . Alcohol use: No  . Drug use:  No  . Sexual activity: Never  Lifestyle  . Physical activity:    Days per week: 0 days    Minutes per session: 0 min  . Stress: Not at all  Relationships  . Social connections:    Talks on phone: Never    Gets together: Never    Attends religious service: More than 4 times per year    Active member of club or organization: No    Attends meetings of clubs or organizations: Never    Relationship status: Divorced  . Intimate partner violence:    Fear of current or ex partner: No    Emotionally abused: No    Physically abused: No    Forced sexual activity: No  Other Topics Concern  . Not on file  Social History Narrative  . Not on file    Review of Systems: See HPI, otherwise negative ROS  Physical Exam: BP (!) 210/55   Pulse (!) 44   Temp (!) 96.2 F (35.7 C) (Tympanic)   Resp 18   Ht 5\' 5"  (1.651 m)   Wt 72.1 kg   SpO2 96%   BMI 26.46 kg/m  General:   Alert,  pleasant and cooperative in NAD Head:  Normocephalic and atraumatic. Neck:   Supple; no masses or thyromegaly. Lungs:  Clear throughout to auscultation, normal respiratory effort.    Heart:  +S1, +S2, Regular rate and rhythm, No edema. Abdomen:  Soft, nontender and nondistended. Normal bowel sounds, without guarding, and without rebound.   Neurologic:  Alert and  oriented x4;  grossly normal neurologically.  Impression/Plan: Gloria Allen is here for a colonoscopy to be performed for average risk screening. Procedure incomplete yesterday due to poor prep.  Colon mass was seen in the sigmoid colon yesterday and pathology showed Invasive adenocarcinoma. Pt was informed of the diagnosis of cancer prior to the procedure and prior to any sedation. (Prior to the procedure, Pt wanted to be informed of the diagnosis without her family member there. We thus informed her of the diagnosis of cancer).  Risks, benefits, limitations, and alternatives regarding  colonoscopy have been reviewed with the patient.  Questions have been answered.  All parties agreeable.   Virgel Manifold, MD  11/06/2018, 10:18 AM

## 2018-11-06 NOTE — Anesthesia Postprocedure Evaluation (Signed)
Anesthesia Post Note  Patient: Gloria Allen  Procedure(s) Performed: COLONOSCOPY WITH PROPOFOL (N/A ) ESOPHAGOGASTRODUODENOSCOPY (EGD) WITH PROPOFOL (N/A )  Patient location during evaluation: PACU Anesthesia Type: General Level of consciousness: awake and alert Pain management: pain level controlled Vital Signs Assessment: post-procedure vital signs reviewed and stable Respiratory status: spontaneous breathing, nonlabored ventilation, respiratory function stable and patient connected to nasal cannula oxygen Cardiovascular status: blood pressure returned to baseline and stable Postop Assessment: no apparent nausea or vomiting Anesthetic complications: no     Last Vitals:  Vitals:   11/05/18 1006 11/05/18 1016  BP: (!) 194/60 (!) 195/67  Pulse: (!) 57 (!) 57  Resp: (!) 22 17  Temp:    SpO2: 97% 98%    Last Pain:  Vitals:   11/06/18 0737  TempSrc:   PainSc: 0-No pain                 Durenda Hurt

## 2018-11-07 ENCOUNTER — Other Ambulatory Visit: Payer: Self-pay | Admitting: Gastroenterology

## 2018-11-07 ENCOUNTER — Encounter: Payer: Self-pay | Admitting: Gastroenterology

## 2018-11-07 ENCOUNTER — Other Ambulatory Visit: Payer: Self-pay

## 2018-11-07 ENCOUNTER — Telehealth: Payer: Self-pay

## 2018-11-07 DIAGNOSIS — D490 Neoplasm of unspecified behavior of digestive system: Secondary | ICD-10-CM

## 2018-11-07 LAB — SURGICAL PATHOLOGY

## 2018-11-07 MED ORDER — FLUCONAZOLE 200 MG PO TABS: ORAL_TABLET | ORAL | 0 refills | Status: AC

## 2018-11-07 NOTE — Anesthesia Postprocedure Evaluation (Signed)
Anesthesia Post Note  Patient: Gloria Allen  Procedure(s) Performed: COLONOSCOPY WITH PROPOFOL (N/A )  Patient location during evaluation: PACU Anesthesia Type: General Level of consciousness: awake and alert Pain management: pain level controlled Vital Signs Assessment: post-procedure vital signs reviewed and stable Respiratory status: spontaneous breathing, nonlabored ventilation, respiratory function stable and patient connected to nasal cannula oxygen Cardiovascular status: blood pressure returned to baseline and stable Postop Assessment: no apparent nausea or vomiting Anesthetic complications: no     Last Vitals:  Vitals:   11/06/18 1140 11/06/18 1142  BP: (!) 171/63 (!) 171/62  Pulse: (!) 50 (!) 51  Resp: 17 16  Temp:    SpO2: 99% 99%    Last Pain:  Vitals:   11/07/18 0751  TempSrc:   PainSc: 0-No pain                 Molli Barrows

## 2018-11-07 NOTE — Telephone Encounter (Signed)
-----   Message from Virgel Manifold, MD sent at 11/07/2018  8:38 AM EST ----- Jackelyn Poling please let patient know, her esophagus sampling showed yeast. I have sent fluconazole to the [facility] in [place] listed on her chart. Please tell her she should NOT TAKE LOVASTATIN (MEVACOR) for the 14 days she is on fluconazole. She can start the Mevacor after completing the fluconazole. Please make sure she repeats this back to you and please document it.  I see she is on Omeprazole already which she should continue. Follow up in clinic as scheduled.

## 2018-11-07 NOTE — Telephone Encounter (Signed)
I went over information 3 times and pt states her granddaughter was listening to me because she could not repeat this back.

## 2018-11-12 ENCOUNTER — Ambulatory Visit (INDEPENDENT_AMBULATORY_CARE_PROVIDER_SITE_OTHER): Payer: Medicare Other | Admitting: Surgery

## 2018-11-12 ENCOUNTER — Other Ambulatory Visit
Admission: RE | Admit: 2018-11-12 | Discharge: 2018-11-12 | Disposition: A | Discharge status: Home / Self Care | Payer: Medicare Other | Source: Ambulatory Visit | Attending: Surgery | Admitting: Surgery

## 2018-11-12 ENCOUNTER — Encounter: Payer: Self-pay | Admitting: Surgery

## 2018-11-12 ENCOUNTER — Other Ambulatory Visit: Payer: Self-pay

## 2018-11-12 ENCOUNTER — Encounter: Payer: Self-pay | Admitting: *Deleted

## 2018-11-12 VITALS — BP 175/90 | HR 66 | Temp 97.5°F | Ht 65.0 in | Wt 160.4 lb

## 2018-11-12 DIAGNOSIS — C187 Malignant neoplasm of sigmoid colon: Secondary | ICD-10-CM | POA: Diagnosis not present

## 2018-11-12 LAB — CBC WITH DIFFERENTIAL/PLATELET
Abs Immature Granulocytes: 0.05 10*3/uL (ref 0.00–0.07)
Basophils Absolute: 0.1 10*3/uL (ref 0.0–0.1)
Basophils Relative: 1 %
Eosinophils Absolute: 0.2 10*3/uL (ref 0.0–0.5)
Eosinophils Relative: 2 %
HCT: 39.1 % (ref 36.0–46.0)
Hemoglobin: 12.2 g/dL (ref 12.0–15.0)
IMMATURE GRANULOCYTES: 1 %
Lymphocytes Relative: 26 %
Lymphs Abs: 2.6 10*3/uL (ref 0.7–4.0)
MCH: 26.6 pg (ref 26.0–34.0)
MCHC: 31.2 g/dL (ref 30.0–36.0)
MCV: 85.4 fL (ref 80.0–100.0)
Monocytes Absolute: 0.5 10*3/uL (ref 0.1–1.0)
Monocytes Relative: 5 %
Neutro Abs: 6.5 10*3/uL (ref 1.7–7.7)
Neutrophils Relative %: 65 %
Platelets: 350 10*3/uL (ref 150–400)
RBC: 4.58 MIL/uL (ref 3.87–5.11)
RDW: 13.9 % (ref 11.5–15.5)
WBC: 9.9 10*3/uL (ref 4.0–10.5)
nRBC: 0 % (ref 0.0–0.2)

## 2018-11-12 LAB — COMPREHENSIVE METABOLIC PANEL
ALT: 15 U/L (ref 0–44)
ANION GAP: 9 (ref 5–15)
AST: 22 U/L (ref 15–41)
Albumin: 3.9 g/dL (ref 3.5–5.0)
Alkaline Phosphatase: 111 U/L (ref 38–126)
BUN: 8 mg/dL (ref 8–23)
CO2: 27 mmol/L (ref 22–32)
Calcium: 9.1 mg/dL (ref 8.9–10.3)
Chloride: 104 mmol/L (ref 98–111)
Creatinine, Ser: 0.69 mg/dL (ref 0.44–1.00)
GFR calc Af Amer: >60 mL/min (ref >60)
GFR calc non Af Amer: >60 mL/min (ref >60)
GLUCOSE: 106 mg/dL — AB (ref 70–99)
Potassium: 3.5 mmol/L (ref 3.5–5.1)
Sodium: 140 mmol/L (ref 135–145)
Total Bilirubin: 0.4 mg/dL (ref 0.3–1.2)
Total Protein: 7.2 g/dL (ref 6.5–8.1)

## 2018-11-12 MED ORDER — ERYTHROMYCIN 500 MG PO TBEC: 500.0000 mg | DELAYED_RELEASE_TABLET | ORAL | 0 refills | Status: DC

## 2018-11-12 MED ORDER — POLYETHYLENE GLYCOL 3350 17 GM/SCOOP PO POWD: ORAL | 0 refills | Status: DC

## 2018-11-12 MED ORDER — BISACODYL 5 MG PO TBEC: DELAYED_RELEASE_TABLET | ORAL | 0 refills | Status: DC

## 2018-11-12 MED ORDER — NEOMYCIN SULFATE 500 MG PO TABS: ORAL_TABLET | ORAL | 0 refills | Status: DC

## 2018-11-12 NOTE — Patient Instructions (Signed)
Patient will need to be scheduled for surgery with Dr.Pabon. Patient will need a CT Scan of the abdomen/pelvis with IV contrast, she will also need lab work completed (CMP,CBC,CEA).   Call the office with any questions or concerns.

## 2018-11-12 NOTE — Progress Notes (Signed)
Patient was sent to [facility] today to have the following labs drawn: CBC, CMP, and CEA.  Patient has been scheduled for a CT abdomen/pelvis with contrast at [facility] for 11-20-18 at 10:30 am (arrive 10:15 am). Prep: NPO 4 hours prior and pick up prep kit. Patient verbalizes understanding.  Patient's surgery to be scheduled for 11-27-18 at [facility] with Dr. Dahlia Byes. Dr. Rosana Hoes to assist (ok per Dr. Rosana Hoes).  The patient is aware she will need to Pre-Admit. Patient will check in at the [facility], [address] (first floor). Patient will be contacted with date/time once [facility] arranges.  The patient is aware to call the office should she have further questions.

## 2018-11-13 ENCOUNTER — Telehealth: Payer: Self-pay | Admitting: *Deleted

## 2018-11-13 ENCOUNTER — Other Ambulatory Visit: Payer: Medicare Other

## 2018-11-13 LAB — CEA: CEA: 4.4 ng/mL (ref 0.0–4.7)

## 2018-11-13 NOTE — Telephone Encounter (Signed)
No answer on cell phone and not able to leave a message.   We need to inform patient that surgery was scheduled for 11-27-18.  Also, need to inform patient of Pre-admit appointment scheduled for 11-20-18 at 8 am.

## 2018-11-13 NOTE — Telephone Encounter (Signed)
-----   Message from Jules Husbands, MD sent at 11/13/2018  8:17 AM EST ----- Please let her know  that all  her lab work was fine.  ----- Message ----- From: Buel Ream, Lab [facility] [place] Sent: 11/12/2018   4:43 PM EST To: Jules Husbands, MD

## 2018-11-13 NOTE — Progress Notes (Signed)
Tumor Board Documentation  Gloria Allen was presented by Verlon Au, RN, OCN, BSN at our Tumor Board on 11/13/2018, which included representatives from medical oncology, radiation oncology, surgical oncology, surgical, radiology, pathology, navigation, internal medicine, research.  Gloria Allen currently presents for discussion, for [place], for new positive pathology with history of the following treatments: none.  Additionally, we reviewed previous medical and familial history, history of present illness, and recent lab results along with all available histopathologic and imaging studies. The tumor board considered available treatment options and made the following recommendations: Surgery CT Scan preoperatively is scheduled  The following procedures/referrals were also placed: No orders of the defined types were placed in this encounter.   Clinical Trial Status: not discussed   Staging used: AJCC Stage Group  AJCC Staging:       Group: Invasive Adenocarcinoma of Colon  National site-specific guidelines NCCN were discussed with respect to the case.  Tumor board is a meeting of clinicians from various specialty areas who evaluate and discuss patients for whom a multidisciplinary approach is being considered. Final determinations in the plan of care are those of the provider(s). The responsibility for follow up of recommendations given during tumor board is that of the provider.   Today's extended care, comprehensive team conference, Gloria Allen was not present for the discussion and was not examined.   Multidisciplinary Tumor Board is a multidisciplinary case peer review process.  Decisions discussed in the Multidisciplinary Tumor Board reflect the opinions of the specialists present at the conference without having examined the patient.  Ultimately, treatment and diagnostic decisions rest with the primary provider(s) and the patient.

## 2018-11-13 NOTE — Telephone Encounter (Signed)
Notified patient as instructed, patient pleased. Discussed follow-up appointments, patient agrees  

## 2018-11-14 ENCOUNTER — Encounter: Payer: Self-pay | Admitting: Surgery

## 2018-11-14 NOTE — Progress Notes (Signed)
Patient ID: Gloria Allen, female   DOB: 06/24/1946, 73 y.o.   MRN: [id number]  HPI Gloria Allen is a 73 y.o. female asked to see in consultation at the request of Dr. Bonna Gains.  He was recently diagnosed with a sigmoid colon mass at 12 cm and pathology consistent with invasive adenocarcinoma.  Please note that I have personally reviewed the images for the colonoscopy.  Did have also Barrett's esophagus on the EGD.  She was tattooed.  He specifically denies any blood in the stool.  No hematochezia.  No nausea no vomiting no weight loss no abdominal pain.  He does take BC powder.  Able to perform more than 4 METS of activity without any shortness of breath or chest pain.  Her CMP and cbcwere completely normal. Recent CXR personally reviewed no evidence of mets. She Did have a history of abdominal hysterectomy and appendectomy in the past.  HPI  Past Medical History:  Diagnosis Date  . Anxiety   . Arthritis   . Cervical central spinal stenosis (C3-C7) (worse at C4-5) 07/30/2017  . Cervical foraminal stenosis (C4-5 and C5-6) (Bilateral) (L>R) 07/30/2017  . Chronic lower extremity pain (Fourth Area of Pain) (Bilateral) (R>L) 06/11/2017  . Chronic neck pain (Primary Area of Pain) (Bilateral) (R>L) 06/11/2017  . Chronic sacroiliac joint pain (Bilateral) (R>L) 06/11/2017  . Chronic shoulder pain [facility] Area of Pain) (Right) 06/11/2017  . Chronic upper extremity pain (Fifth Area of Pain) (Bilateral) (R>L) 06/11/2017  . DDD (degenerative disc disease), cervical 07/30/2017  . DDD (degenerative disc disease), lumbar 07/30/2017  . Depression   . DISH (diffuse idiopathic skeletal hyperostosis) 07/30/2017  . Dyspnea    with exertion  . Entrapment syndrome 06/20/2017   2002 on R and 2010 on L  . Full thickness rotator cuff tear 06/12/2017  . GERD (gastroesophageal reflux disease)   . Grade 1 Anterolisthesis of L4 over L5 07/30/2017  . Headache   . Hx: UTI (urinary tract infection)   . Hypertension    . Inflammation of joint of shoulder region 01/15/2017  . Lumbar central spinal stenosis (L4-5) 07/30/2017  . Lumbar disc protrusion (Left: L5-S1) (Right: L1-2) 07/30/2017   L5-S1 left foraminal protrusion with L5 impingement. L1-2 right paracentral protrusion without impingement.  . Lumbar facet arthropathy (Bilateral) 07/30/2017  . Lumbar facet syndrome (Bilateral) (R>L) 07/30/2017  . Osteoarthritis of shoulder (Right) 01/15/2017    Past Surgical History:  Procedure Laterality Date  . ABDOMINAL HYSTERECTOMY    . APPENDECTOMY    . BREAST REDUCTION SURGERY Bilateral   . COLONOSCOPY WITH PROPOFOL N/A 11/05/2018   Procedure: COLONOSCOPY WITH PROPOFOL;  Surgeon: Virgel Manifold, MD;  Location: [facility] ENDOSCOPY;  Service: Endoscopy;  Laterality: N/A;  . COLONOSCOPY WITH PROPOFOL N/A 11/06/2018   Procedure: COLONOSCOPY WITH PROPOFOL;  Surgeon: Virgel Manifold, MD;  Location: [facility] ENDOSCOPY;  Service: Endoscopy;  Laterality: N/A;  . DILATION AND CURETTAGE OF UTERUS    . ESOPHAGOGASTRODUODENOSCOPY (EGD) WITH PROPOFOL N/A 11/05/2018   Procedure: ESOPHAGOGASTRODUODENOSCOPY (EGD) WITH PROPOFOL;  Surgeon: Virgel Manifold, MD;  Location: [facility] ENDOSCOPY;  Service: Endoscopy;  Laterality: N/A;  . REDUCTION MAMMAPLASTY    . SHOULDER ARTHROSCOPY WITH OPEN ROTATOR CUFF REPAIR AND DISTAL CLAVICLE ACROMINECTOMY Right 03/26/2017   Procedure: right shoulder arthroscopy, arthroscopic subacromial decompression, distal clavicle excision, mini open rotator cuff repair;  Surgeon: Thornton Park, MD;  Location: [facility] ORS;  Service: Orthopedics;  Laterality: Right;    Family History  Problem Relation Age of Onset  .  Hypertension Mother   . Breast cancer Mother 21  . Breast cancer Sister 66    Social History Social History   Tobacco Use  . Smoking status: Former Smoker    Packs/day: 1.00    Types: Cigarettes    Last attempt to quit: 2010    Years since quitting: 10.1  . Smokeless tobacco: Never  Used  . Tobacco comment: quit over 30 years ago   Substance Use Topics  . Alcohol use: No  . Drug use: No    Allergies  Allergen Reactions  . Ace Inhibitors Swelling  . Angiotensin Receptor Blockers Swelling  . Sulfa Antibiotics Itching  . Chlorthalidone Rash  . Flexeril [Cyclobenzaprine] Rash    Current Outpatient Medications  Medication Sig Dispense Refill  . albuterol (PROVENTIL HFA;VENTOLIN HFA) 108 (90 Base) MCG/ACT inhaler Inhale 2 puffs into the lungs every 6 (six) hours as needed for wheezing or shortness of breath. 1 Inhaler 3  . amLODipine (NORVASC) 10 MG tablet     . ANORO ELLIPTA 62.5-25 MCG/INH AEPB INHALE 1 PUFF BY MOUTH DAILY 60 each 0  . BREO ELLIPTA 200-25 MCG/INH AEPB     . chlorthalidone (HYGROTON) 25 MG tablet     . clonazePAM (KLONOPIN) 0.5 MG tablet Take 0.5 mg by mouth 3 (three) times daily as needed for anxiety.     . cloNIDine (CATAPRES) 0.3 MG tablet Take 2 tablets (0.6 mg total) by mouth 2 (two) times daily. 120 tablet 2  . fluconazole (DIFLUCAN) 200 MG tablet Take 2 tablets (400 mg total) by mouth daily for 1 day, THEN 1 tablet (200 mg total) daily for 14 days. 16 tablet 0  . furosemide (LASIX) 20 MG tablet     . hydrALAZINE (APRESOLINE) 100 MG tablet Take 1 tablet (100 mg total) by mouth 3 (three) times daily. 90 tablet 1  . hydrALAZINE (APRESOLINE) 50 MG tablet TAKE 1 TABLET BY MOUTH THREE TIMES DAILY 90 tablet 0  . hydrOXYzine (ATARAX/VISTARIL) 10 MG tablet TAKE 1 TABLET BY MOUTH THREE TIMES A DAY AS NEEDED 90 tablet 0  . lovastatin (MEVACOR) 40 MG tablet Take 1 tablet (40 mg total) by mouth at bedtime. 90 tablet 1  . meclizine (ANTIVERT) 12.5 MG tablet TAKE 1 TABLET BY MOUTH THREE TIMES DAILY AS NEEDED FOR DIZZINESS  0  . methocarbamol (ROBAXIN) 500 MG tablet Take 1 tablet (500 mg total) by mouth at bedtime as needed for muscle spasms. 30 tablet 0  . metoprolol succinate (TOPROL-XL) 100 MG 24 hr tablet TAKE 1 TABLET BY MOUTH DAILY 30 tablet 0  .  mirtazapine (REMERON) 15 MG tablet take 1 tablet by mouth at bedtime for APPETITIE 180 tablet 0  . montelukast (SINGULAIR) 10 MG tablet Take 1 tablet (10 mg total) by mouth at bedtime as needed. 90 tablet 1  . nitrofurantoin (MACRODANTIN) 100 MG capsule Take 100 mg by mouth 2 (two) times daily.    Marland Kitchen omeprazole (PRILOSEC) 40 MG capsule Take 1 capsule (40 mg total) by mouth 2 (two) times daily. 60 capsule 2  . oxybutynin (DITROPAN) 5 MG tablet TAKE 1 TABLET BY MOUTH TWICE DAILY 180 tablet 0  . sertraline (ZOLOFT) 100 MG tablet Take 2 tablets (200 mg total) by mouth at bedtime. 60 tablet 3  . terazosin (HYTRIN) 5 MG capsule TAKE 1 CAPSULE BY MOUTH ONCE DAILY AT BEDTIME 30 capsule 0  . traZODone (DESYREL) 50 MG tablet Take 0.5-1 tablets (25-50 mg total) by mouth at bedtime as needed for  sleep. 30 tablet 3  . triamcinolone cream (KENALOG) 0.1 % APPLY TOPICALLY TWICE DAILY AS NEEDED FOR ITCHING 30 g 0  . bisacodyl (BISACODYL) 5 MG EC tablet Take 4 tablets (5 mg each) at 8 am the day before surgery. 4 tablet 0  . Erythromycin 500 MG TBEC Take 500 mg by mouth as directed. Take two tablets at 8 am, two tablets at 2 pm, and two tablets at 8 pm the day before surgery. 6 tablet 0  . neomycin (MYCIFRADIN) 500 MG tablet Take two tablets at 8 am, two tablets at 2 pm, and two tablets at 8 pm the day before surgery. 6 tablet 0  . polyethylene glycol powder (GLYCOLAX/MIRALAX) powder 255 grams one bottle for bowel prep 255 g 0   No current facility-administered medications for this visit.      Review of Systems Full ROS  was asked and was negative except for the information on the HPI  Physical Exam Blood pressure (!) 175/90, pulse 66, temperature (!) 97.5 F (36.4 C), temperature source Temporal, height 5\' 5"  (1.651 m), weight 160 lb 6.4 oz (72.8 kg), SpO2 96 %. CONSTITUTIONAL: NAD EYES: Pupils are equal, round, and reactive to light, Sclera are non-icteric. EARS, NOSE, MOUTH AND THROAT: The oropharynx is  clear. The oral mucosa is pink and moist. Hearing is intact to voice. LYMPH NODES:  Lymph nodes in the neck are normal. RESPIRATORY:  Lungs are clear. There is normal respiratory effort, with equal breath sounds bilaterally, and without pathologic use of accessory muscles. CARDIOVASCULAR: Heart is regular without murmurs, gallops, or rubs. GI: The abdomen is  soft, nontender, and nondistended. There are no palpable masses. There is no hepatosplenomegaly. There are normal bowel sounds in all quadrants. GU: Rectal deferred.   MUSCULOSKELETAL: Normal muscle strength and tone. No cyanosis or edema.   SKIN: Turgor is good and there are no pathologic skin lesions or ulcers. NEUROLOGIC: Motor and sensation is grossly normal. Cranial nerves are grossly intact. PSYCH:  Oriented to person, place and time. Affect is normal.  Data Reviewed  I have personally reviewed the patient's imaging, laboratory findings and medical records.    Assessment/Plan newly Diagnosed adenocarcinoma of the sigmoid colon.  Discussed with the patient in detail about her disease process.  We will stage it at this time.  This will include a CT scan of the abdomen and pelvis and I will also obtain a CEA, CMP and a CBC.  Is with the patient in detail about the need for sigmoid colectomy.  Depending on CT findings if she obviously has distant metastatic disease may need to change the course of treatment.  But likely this has not invaded to the liver or to distant metastasis.  With that being said the patient wants to proceed with a sigmoid colectomy.  Procedure was plan to the patient in detail.  Risk, benefits and possible complications including but not limited to: Bleeding, infection, anastomotic leak, re-interventions.  Also the potential for adjuvant chemotherapy was discussed with the patient in detail. All her questions were answered. There is note that I spent about 60 minutes in this encounter with greater than 50% spent in  coordination and counseling of her care.  Copy of this report was sent to the referring provider .     Caroleen Hamman, MD FACS General Surgeon 11/14/2018, 12:09 PM

## 2018-11-14 NOTE — Telephone Encounter (Signed)
Notified patient of surgery date and pre admit date and time. She is aware dates, time, and instructions.

## 2018-11-19 DIAGNOSIS — I10 Essential (primary) hypertension: Secondary | ICD-10-CM | POA: Diagnosis not present

## 2018-11-20 ENCOUNTER — Inpatient Hospital Stay: Admission: RE | Admit: 2018-11-20 | Payer: Medicare Other | Source: Ambulatory Visit

## 2018-11-20 ENCOUNTER — Telehealth: Payer: Self-pay | Admitting: *Deleted

## 2018-11-20 ENCOUNTER — Ambulatory Visit
Admission: RE | Admit: 2018-11-20 | Discharge: 2018-11-20 | Disposition: A | Discharge status: Home / Self Care | Payer: Medicare Other | Source: Ambulatory Visit | Attending: Surgery | Admitting: Surgery

## 2018-11-20 DIAGNOSIS — J45909 Unspecified asthma, uncomplicated: Secondary | ICD-10-CM | POA: Diagnosis not present

## 2018-11-20 DIAGNOSIS — G894 Chronic pain syndrome: Secondary | ICD-10-CM | POA: Diagnosis not present

## 2018-11-20 DIAGNOSIS — R402142 Coma scale, eyes open, spontaneous, at arrival to emergency department: Secondary | ICD-10-CM | POA: Diagnosis not present

## 2018-11-20 DIAGNOSIS — Z803 Family history of malignant neoplasm of breast: Secondary | ICD-10-CM | POA: Diagnosis not present

## 2018-11-20 DIAGNOSIS — R402362 Coma scale, best motor response, obeys commands, at arrival to emergency department: Secondary | ICD-10-CM | POA: Diagnosis not present

## 2018-11-20 DIAGNOSIS — M48061 Spinal stenosis, lumbar region without neurogenic claudication: Secondary | ICD-10-CM | POA: Diagnosis not present

## 2018-11-20 DIAGNOSIS — M25571 Pain in right ankle and joints of right foot: Secondary | ICD-10-CM | POA: Diagnosis not present

## 2018-11-20 DIAGNOSIS — M5136 Other intervertebral disc degeneration, lumbar region: Secondary | ICD-10-CM | POA: Diagnosis not present

## 2018-11-20 DIAGNOSIS — R402252 Coma scale, best verbal response, oriented, at arrival to emergency department: Secondary | ICD-10-CM | POA: Diagnosis not present

## 2018-11-20 DIAGNOSIS — I10 Essential (primary) hypertension: Secondary | ICD-10-CM | POA: Diagnosis not present

## 2018-11-20 DIAGNOSIS — Z8249 Family history of ischemic heart disease and other diseases of the circulatory system: Secondary | ICD-10-CM | POA: Diagnosis not present

## 2018-11-20 DIAGNOSIS — Z87891 Personal history of nicotine dependence: Secondary | ICD-10-CM | POA: Diagnosis not present

## 2018-11-20 DIAGNOSIS — M4802 Spinal stenosis, cervical region: Secondary | ICD-10-CM | POA: Diagnosis not present

## 2018-11-20 DIAGNOSIS — S82891A Other fracture of right lower leg, initial encounter for closed fracture: Secondary | ICD-10-CM | POA: Diagnosis not present

## 2018-11-20 DIAGNOSIS — S82831A Other fracture of upper and lower end of right fibula, initial encounter for closed fracture: Secondary | ICD-10-CM | POA: Diagnosis not present

## 2018-11-20 DIAGNOSIS — M5126 Other intervertebral disc displacement, lumbar region: Secondary | ICD-10-CM | POA: Diagnosis not present

## 2018-11-20 DIAGNOSIS — Z01818 Encounter for other preprocedural examination: Secondary | ICD-10-CM | POA: Diagnosis not present

## 2018-11-20 DIAGNOSIS — M4819 Ankylosing hyperostosis [Forestier], multiple sites in spine: Secondary | ICD-10-CM | POA: Diagnosis not present

## 2018-11-20 DIAGNOSIS — S82841A Displaced bimalleolar fracture of right lower leg, initial encounter for closed fracture: Secondary | ICD-10-CM | POA: Diagnosis not present

## 2018-11-20 DIAGNOSIS — S82831D Other fracture of upper and lower end of right fibula, subsequent encounter for closed fracture with routine healing: Secondary | ICD-10-CM | POA: Diagnosis not present

## 2018-11-20 DIAGNOSIS — M19011 Primary osteoarthritis, right shoulder: Secondary | ICD-10-CM | POA: Diagnosis not present

## 2018-11-20 DIAGNOSIS — M450 Ankylosing spondylitis of multiple sites in spine: Secondary | ICD-10-CM | POA: Diagnosis not present

## 2018-11-20 DIAGNOSIS — K219 Gastro-esophageal reflux disease without esophagitis: Secondary | ICD-10-CM | POA: Diagnosis not present

## 2018-11-20 DIAGNOSIS — M5030 Other cervical disc degeneration, unspecified cervical region: Secondary | ICD-10-CM | POA: Diagnosis not present

## 2018-11-20 DIAGNOSIS — C187 Malignant neoplasm of sigmoid colon: Secondary | ICD-10-CM

## 2018-11-20 DIAGNOSIS — C189 Malignant neoplasm of colon, unspecified: Secondary | ICD-10-CM | POA: Diagnosis not present

## 2018-11-20 DIAGNOSIS — Z79899 Other long term (current) drug therapy: Secondary | ICD-10-CM | POA: Diagnosis not present

## 2018-11-20 MED ORDER — IOPAMIDOL (ISOVUE-300) INJECTION 61%
100.0000 mL | Freq: Once | INTRAVENOUS | Status: AC | PRN
  Administered 2018-11-20: 100 mL via INTRAVENOUS

## 2018-11-20 NOTE — Telephone Encounter (Signed)
Patient contacted this morning because per Caryl Pina at [facility], patient missed her 8 am appointment.   The patient states that she is getting checked in for CT scan now. She got confused with appointments and is apologetic.   Patient is aware that Pre-admission appointment has been rescheduled for 11-25-18 at 1:45 pm. She verbalizes understanding.   Surgery scheduled for 11-27-18 with Dr. Dahlia Byes.

## 2018-11-21 ENCOUNTER — Emergency Department: Payer: Medicare Other

## 2018-11-21 ENCOUNTER — Inpatient Hospital Stay: Payer: Medicare Other

## 2018-11-21 ENCOUNTER — Encounter: Payer: Self-pay | Admitting: Emergency Medicine

## 2018-11-21 ENCOUNTER — Other Ambulatory Visit: Payer: Self-pay

## 2018-11-21 ENCOUNTER — Encounter
Admission: EM | Disposition: A | Discharge status: Home Health | Payer: Self-pay | Source: Home / Self Care | Attending: Surgery

## 2018-11-21 ENCOUNTER — Inpatient Hospital Stay
Admission: EM | Admit: 2018-11-21 | Discharge: 2018-11-24 | DRG: 493 | Disposition: A | Discharge status: Home Health | Payer: Medicare Other | Attending: Surgery | Admitting: Surgery

## 2018-11-21 ENCOUNTER — Inpatient Hospital Stay: Admit: 2018-11-21 | Payer: Medicare Other | Admitting: Surgery

## 2018-11-21 ENCOUNTER — Inpatient Hospital Stay: Payer: Medicare Other | Admitting: Anesthesiology

## 2018-11-21 DIAGNOSIS — I10 Essential (primary) hypertension: Secondary | ICD-10-CM | POA: Diagnosis present

## 2018-11-21 DIAGNOSIS — G894 Chronic pain syndrome: Secondary | ICD-10-CM | POA: Diagnosis present

## 2018-11-21 DIAGNOSIS — R402142 Coma scale, eyes open, spontaneous, at arrival to emergency department: Secondary | ICD-10-CM | POA: Diagnosis present

## 2018-11-21 DIAGNOSIS — S9305XA Dislocation of left ankle joint, initial encounter: Secondary | ICD-10-CM

## 2018-11-21 DIAGNOSIS — C187 Malignant neoplasm of sigmoid colon: Secondary | ICD-10-CM | POA: Diagnosis present

## 2018-11-21 DIAGNOSIS — Z87891 Personal history of nicotine dependence: Secondary | ICD-10-CM

## 2018-11-21 DIAGNOSIS — J45909 Unspecified asthma, uncomplicated: Secondary | ICD-10-CM | POA: Diagnosis present

## 2018-11-21 DIAGNOSIS — S82891A Other fracture of right lower leg, initial encounter for closed fracture: Secondary | ICD-10-CM

## 2018-11-21 DIAGNOSIS — Z803 Family history of malignant neoplasm of breast: Secondary | ICD-10-CM | POA: Diagnosis not present

## 2018-11-21 DIAGNOSIS — R402362 Coma scale, best motor response, obeys commands, at arrival to emergency department: Secondary | ICD-10-CM | POA: Diagnosis present

## 2018-11-21 DIAGNOSIS — M5136 Other intervertebral disc degeneration, lumbar region: Secondary | ICD-10-CM | POA: Diagnosis present

## 2018-11-21 DIAGNOSIS — M5030 Other cervical disc degeneration, unspecified cervical region: Secondary | ICD-10-CM | POA: Diagnosis present

## 2018-11-21 DIAGNOSIS — M19011 Primary osteoarthritis, right shoulder: Secondary | ICD-10-CM | POA: Diagnosis present

## 2018-11-21 DIAGNOSIS — M4802 Spinal stenosis, cervical region: Secondary | ICD-10-CM | POA: Diagnosis present

## 2018-11-21 DIAGNOSIS — F332 Major depressive disorder, recurrent severe without psychotic features: Secondary | ICD-10-CM | POA: Diagnosis present

## 2018-11-21 DIAGNOSIS — Z8249 Family history of ischemic heart disease and other diseases of the circulatory system: Secondary | ICD-10-CM | POA: Diagnosis not present

## 2018-11-21 DIAGNOSIS — R402252 Coma scale, best verbal response, oriented, at arrival to emergency department: Secondary | ICD-10-CM | POA: Diagnosis present

## 2018-11-21 DIAGNOSIS — Z882 Allergy status to sulfonamides status: Secondary | ICD-10-CM

## 2018-11-21 DIAGNOSIS — Z79899 Other long term (current) drug therapy: Secondary | ICD-10-CM

## 2018-11-21 DIAGNOSIS — M5126 Other intervertebral disc displacement, lumbar region: Secondary | ICD-10-CM | POA: Diagnosis present

## 2018-11-21 DIAGNOSIS — Y9301 Activity, walking, marching and hiking: Secondary | ICD-10-CM | POA: Diagnosis present

## 2018-11-21 DIAGNOSIS — M48061 Spinal stenosis, lumbar region without neurogenic claudication: Secondary | ICD-10-CM | POA: Diagnosis present

## 2018-11-21 DIAGNOSIS — Y92008 Other place in unspecified non-institutional (private) residence as the place of occurrence of the external cause: Secondary | ICD-10-CM

## 2018-11-21 DIAGNOSIS — W000XXA Fall on same level due to ice and snow, initial encounter: Secondary | ICD-10-CM | POA: Diagnosis present

## 2018-11-21 DIAGNOSIS — S82841A Displaced bimalleolar fracture of right lower leg, initial encounter for closed fracture: Principal | ICD-10-CM | POA: Diagnosis present

## 2018-11-21 DIAGNOSIS — F419 Anxiety disorder, unspecified: Secondary | ICD-10-CM | POA: Diagnosis present

## 2018-11-21 DIAGNOSIS — M450 Ankylosing spondylitis of multiple sites in spine: Secondary | ICD-10-CM

## 2018-11-21 DIAGNOSIS — M4819 Ankylosing hyperostosis [Forestier], multiple sites in spine: Secondary | ICD-10-CM | POA: Diagnosis present

## 2018-11-21 DIAGNOSIS — K219 Gastro-esophageal reflux disease without esophagitis: Secondary | ICD-10-CM | POA: Diagnosis present

## 2018-11-21 DIAGNOSIS — Z888 Allergy status to other drugs, medicaments and biological substances status: Secondary | ICD-10-CM

## 2018-11-21 DIAGNOSIS — F4310 Post-traumatic stress disorder, unspecified: Secondary | ICD-10-CM | POA: Diagnosis present

## 2018-11-21 DIAGNOSIS — M25571 Pain in right ankle and joints of right foot: Secondary | ICD-10-CM | POA: Diagnosis present

## 2018-11-21 DIAGNOSIS — S82832A Other fracture of upper and lower end of left fibula, initial encounter for closed fracture: Secondary | ICD-10-CM

## 2018-11-21 HISTORY — PX: ORIF ANKLE FRACTURE: SHX5408

## 2018-11-21 LAB — CBC
HCT: 40.6 % (ref 36.0–46.0)
HEMOGLOBIN: 12.6 g/dL (ref 12.0–15.0)
MCH: 26.6 pg (ref 26.0–34.0)
MCHC: 31 g/dL (ref 30.0–36.0)
MCV: 85.7 fL (ref 80.0–100.0)
NRBC: 0 % (ref 0.0–0.2)
PLATELETS: 389 10*3/uL (ref 150–400)
RBC: 4.74 MIL/uL (ref 3.87–5.11)
RDW: 14.1 % (ref 11.5–15.5)
WBC: 12.9 10*3/uL — ABNORMAL HIGH (ref 4.0–10.5)

## 2018-11-21 LAB — BASIC METABOLIC PANEL
Anion gap: 11 (ref 5–15)
BUN: 11 mg/dL (ref 8–23)
CO2: 26 mmol/L (ref 22–32)
Calcium: 9.4 mg/dL (ref 8.9–10.3)
Chloride: 100 mmol/L (ref 98–111)
Creatinine, Ser: 0.65 mg/dL (ref 0.44–1.00)
GFR calc Af Amer: >60 mL/min (ref >60)
GFR calc non Af Amer: >60 mL/min (ref >60)
Glucose, Bld: 139 mg/dL — ABNORMAL HIGH (ref 70–99)
Potassium: 4.1 mmol/L (ref 3.5–5.1)
SODIUM: 137 mmol/L (ref 135–145)

## 2018-11-21 LAB — TYPE AND SCREEN
ABO/RH(D): B POS
Antibody Screen: NEGATIVE

## 2018-11-21 LAB — PROTIME-INR
INR: 1.04
Prothrombin Time: 13.5 seconds (ref 11.4–15.2)

## 2018-11-21 SURGERY — OPEN REDUCTION INTERNAL FIXATION (ORIF) ANKLE FRACTURE
Anesthesia: Spinal | Laterality: Right

## 2018-11-21 MED ORDER — BISACODYL 10 MG RE SUPP: 10.0000 mg | Freq: Every day | RECTAL | Status: DC | PRN

## 2018-11-21 MED ORDER — METOPROLOL SUCCINATE ER 50 MG PO TB24
50.0000 mg | ORAL_TABLET | Freq: Every day | ORAL | Status: DC
  Administered 2018-11-22 – 2018-11-24 (×3): 50 mg via ORAL
  Filled 2018-11-21 (×3): qty 1

## 2018-11-21 MED ORDER — MIRTAZAPINE 15 MG PO TABS
15.0000 mg | ORAL_TABLET | Freq: Every day | ORAL | Status: DC
  Administered 2018-11-22 – 2018-11-23 (×2): 15 mg via ORAL
  Filled 2018-11-21 (×2): qty 1

## 2018-11-21 MED ORDER — MECLIZINE HCL 12.5 MG PO TABS
12.5000 mg | ORAL_TABLET | Freq: Three times a day (TID) | ORAL | Status: DC | PRN
  Filled 2018-11-21: qty 1

## 2018-11-21 MED ORDER — BUPIVACAINE HCL (PF) 0.75 % IJ SOLN
INTRAMUSCULAR | Status: DC | PRN
  Administered 2018-11-21: 1.5 mL

## 2018-11-21 MED ORDER — FLEET ENEMA 7-19 GM/118ML RE ENEM: 1.0000 | ENEMA | Freq: Once | RECTAL | Status: DC | PRN

## 2018-11-21 MED ORDER — KETOROLAC TROMETHAMINE 15 MG/ML IJ SOLN
15.0000 mg | Freq: Once | INTRAMUSCULAR | Status: AC
  Administered 2018-11-21: 15 mg via INTRAVENOUS

## 2018-11-21 MED ORDER — PRAVASTATIN SODIUM 20 MG PO TABS
40.0000 mg | ORAL_TABLET | Freq: Every day | ORAL | Status: DC
  Administered 2018-11-22 – 2018-11-23 (×2): 40 mg via ORAL
  Filled 2018-11-21 (×2): qty 2

## 2018-11-21 MED ORDER — CYCLOBENZAPRINE HCL 10 MG PO TABS: 5.0000 mg | ORAL_TABLET | Freq: Every day | ORAL | Status: DC | PRN

## 2018-11-21 MED ORDER — CEFAZOLIN SODIUM-DEXTROSE 2-3 GM-%(50ML) IV SOLR
INTRAVENOUS | Status: DC | PRN
  Administered 2018-11-21: 2 g via INTRAVENOUS

## 2018-11-21 MED ORDER — TRAZODONE HCL 50 MG PO TABS
50.0000 mg | ORAL_TABLET | Freq: Every day | ORAL | Status: DC
  Administered 2018-11-22 – 2018-11-23 (×2): 50 mg via ORAL
  Filled 2018-11-21 (×2): qty 1

## 2018-11-21 MED ORDER — CEFAZOLIN SODIUM-DEXTROSE 2-4 GM/100ML-% IV SOLN: 2.0000 g | Freq: Once | INTRAVENOUS | Status: DC

## 2018-11-21 MED ORDER — POTASSIUM 99 MG PO TABS: 1.0000 | ORAL_TABLET | Freq: Every day | ORAL | Status: DC | PRN

## 2018-11-21 MED ORDER — HYDRALAZINE HCL 50 MG PO TABS
100.0000 mg | ORAL_TABLET | Freq: Three times a day (TID) | ORAL | Status: DC
  Administered 2018-11-22 – 2018-11-24 (×9): 100 mg via ORAL
  Filled 2018-11-21 (×9): qty 2

## 2018-11-21 MED ORDER — ONDANSETRON HCL 4 MG/2ML IJ SOLN: 4.0000 mg | Freq: Once | INTRAMUSCULAR | Status: DC | PRN

## 2018-11-21 MED ORDER — ONDANSETRON HCL 4 MG/2ML IJ SOLN
INTRAMUSCULAR | Status: DC | PRN
  Administered 2018-11-21: 4 mg via INTRAVENOUS

## 2018-11-21 MED ORDER — MONTELUKAST SODIUM 10 MG PO TABS: 10.0000 mg | ORAL_TABLET | Freq: Every evening | ORAL | Status: DC | PRN

## 2018-11-21 MED ORDER — LACTATED RINGERS IV SOLN
INTRAVENOUS | Status: DC | PRN
  Administered 2018-11-21: 22:00:00 via INTRAVENOUS

## 2018-11-21 MED ORDER — CLONIDINE HCL 0.1 MG PO TABS
0.3000 mg | ORAL_TABLET | Freq: Every day | ORAL | Status: DC
  Administered 2018-11-22 – 2018-11-24 (×3): 0.3 mg via ORAL
  Filled 2018-11-21 (×3): qty 3

## 2018-11-21 MED ORDER — SODIUM CHLORIDE 0.9 % IV SOLN: INTRAVENOUS | Status: DC

## 2018-11-21 MED ORDER — GENTAMICIN SULFATE 40 MG/ML IJ SOLN
INTRAMUSCULAR | Status: AC
  Filled 2018-11-21: qty 2

## 2018-11-21 MED ORDER — PROPOFOL 10 MG/ML IV BOLUS
INTRAVENOUS | Status: AC
  Filled 2018-11-21: qty 40

## 2018-11-21 MED ORDER — PROPOFOL 10 MG/ML IV BOLUS
INTRAVENOUS | Status: AC
  Filled 2018-11-21: qty 20

## 2018-11-21 MED ORDER — ALBUTEROL SULFATE (2.5 MG/3ML) 0.083% IN NEBU: 2.5000 mg | INHALATION_SOLUTION | Freq: Four times a day (QID) | RESPIRATORY_TRACT | Status: DC | PRN

## 2018-11-21 MED ORDER — CEFAZOLIN SODIUM 1 G IJ SOLR
INTRAMUSCULAR | Status: AC
  Filled 2018-11-21: qty 20

## 2018-11-21 MED ORDER — MORPHINE SULFATE (PF) 2 MG/ML IV SOLN
2.0000 mg | Freq: Once | INTRAVENOUS | Status: AC
  Administered 2018-11-21: 2 mg via INTRAVENOUS
  Filled 2018-11-21: qty 1

## 2018-11-21 MED ORDER — MAGNESIUM HYDROXIDE 400 MG/5ML PO SUSP: 30.0000 mL | Freq: Every day | ORAL | Status: DC | PRN

## 2018-11-21 MED ORDER — PANTOPRAZOLE SODIUM 40 MG IV SOLR: 40.0000 mg | Freq: Every day | INTRAVENOUS | Status: DC

## 2018-11-21 MED ORDER — PROPOFOL 500 MG/50ML IV EMUL
INTRAVENOUS | Status: DC | PRN
  Administered 2018-11-21: 25 ug/kg/min via INTRAVENOUS

## 2018-11-21 MED ORDER — BUPIVACAINE HCL (PF) 0.5 % IJ SOLN
INTRAMUSCULAR | Status: AC
  Filled 2018-11-21: qty 30

## 2018-11-21 MED ORDER — ACETAMINOPHEN 650 MG RE SUPP: 650.0000 mg | Freq: Four times a day (QID) | RECTAL | Status: DC | PRN

## 2018-11-21 MED ORDER — FLUTICASONE FUROATE-VILANTEROL 200-25 MCG/INH IN AEPB
1.0000 | INHALATION_SPRAY | Freq: Every day | RESPIRATORY_TRACT | Status: DC | PRN
  Filled 2018-11-21: qty 28

## 2018-11-21 MED ORDER — OXYCODONE HCL 5 MG PO TABS: 5.0000 mg | ORAL_TABLET | ORAL | Status: DC | PRN

## 2018-11-21 MED ORDER — MIDAZOLAM HCL 2 MG/2ML IJ SOLN
INTRAMUSCULAR | Status: AC
  Filled 2018-11-21: qty 2

## 2018-11-21 MED ORDER — FENTANYL CITRATE (PF) 100 MCG/2ML IJ SOLN
INTRAMUSCULAR | Status: AC
  Filled 2018-11-21: qty 2

## 2018-11-21 MED ORDER — CLONIDINE HCL 0.1 MG PO TABS
0.3000 mg | ORAL_TABLET | Freq: Once | ORAL | Status: AC
  Administered 2018-11-21: 0.3 mg via ORAL
  Filled 2018-11-21: qty 3

## 2018-11-21 MED ORDER — FENTANYL CITRATE (PF) 100 MCG/2ML IJ SOLN
INTRAMUSCULAR | Status: DC | PRN
  Administered 2018-11-21 (×2): 25 ug via INTRAVENOUS

## 2018-11-21 MED ORDER — DOCUSATE SODIUM 100 MG PO CAPS: 100.0000 mg | ORAL_CAPSULE | Freq: Two times a day (BID) | ORAL | Status: DC

## 2018-11-21 MED ORDER — FENTANYL CITRATE (PF) 100 MCG/2ML IJ SOLN: 25.0000 ug | INTRAMUSCULAR | Status: DC | PRN

## 2018-11-21 MED ORDER — CLONAZEPAM 0.5 MG PO TABS
0.5000 mg | ORAL_TABLET | Freq: Three times a day (TID) | ORAL | Status: DC
  Administered 2018-11-22 – 2018-11-24 (×8): 0.5 mg via ORAL
  Filled 2018-11-21 (×8): qty 1

## 2018-11-21 MED ORDER — MIDAZOLAM HCL 5 MG/5ML IJ SOLN
INTRAMUSCULAR | Status: DC | PRN
  Administered 2018-11-21 (×2): 1 mg via INTRAVENOUS

## 2018-11-21 MED ORDER — BUPIVACAINE HCL 0.5 % IJ SOLN
INTRAMUSCULAR | Status: DC | PRN
  Administered 2018-11-21: 20 mL

## 2018-11-21 MED ORDER — PANTOPRAZOLE SODIUM 40 MG PO TBEC
80.0000 mg | DELAYED_RELEASE_TABLET | Freq: Every day | ORAL | Status: DC
  Administered 2018-11-22 – 2018-11-24 (×4): 80 mg via ORAL
  Filled 2018-11-21 (×4): qty 2

## 2018-11-21 MED ORDER — HYDROMORPHONE HCL 1 MG/ML IJ SOLN: 0.2500 mg | INTRAMUSCULAR | Status: DC | PRN

## 2018-11-21 MED ORDER — PHENYLEPHRINE HCL 10 MG/ML IJ SOLN
INTRAMUSCULAR | Status: DC | PRN
  Administered 2018-11-21: 100 ug via INTRAVENOUS
  Administered 2018-11-21: 50 ug via INTRAVENOUS

## 2018-11-21 MED ORDER — OXYBUTYNIN CHLORIDE 5 MG PO TABS
5.0000 mg | ORAL_TABLET | Freq: Two times a day (BID) | ORAL | Status: DC
  Administered 2018-11-22 – 2018-11-24 (×6): 5 mg via ORAL
  Filled 2018-11-21 (×7): qty 1

## 2018-11-21 MED ORDER — KETOROLAC TROMETHAMINE 15 MG/ML IJ SOLN
INTRAMUSCULAR | Status: AC
  Administered 2018-11-22: 15 mg
  Filled 2018-11-21: qty 1

## 2018-11-21 MED ORDER — ONDANSETRON HCL 4 MG/2ML IJ SOLN
4.0000 mg | Freq: Once | INTRAMUSCULAR | Status: AC
  Administered 2018-11-21: 4 mg via INTRAVENOUS
  Filled 2018-11-21: qty 2

## 2018-11-21 MED ORDER — SERTRALINE HCL 50 MG PO TABS
200.0000 mg | ORAL_TABLET | Freq: Every day | ORAL | Status: DC
  Administered 2018-11-22 – 2018-11-23 (×3): 200 mg via ORAL
  Filled 2018-11-21 (×3): qty 4

## 2018-11-21 MED ORDER — UMECLIDINIUM-VILANTEROL 62.5-25 MCG/INH IN AEPB
1.0000 | INHALATION_SPRAY | Freq: Every day | RESPIRATORY_TRACT | Status: DC
  Administered 2018-11-22 – 2018-11-24 (×3): 1 via RESPIRATORY_TRACT
  Filled 2018-11-21: qty 14

## 2018-11-21 MED ORDER — ACETAMINOPHEN 325 MG PO TABS: 650.0000 mg | ORAL_TABLET | Freq: Four times a day (QID) | ORAL | Status: DC | PRN

## 2018-11-21 SURGICAL SUPPLY — 65 items
BANDAGE ACE 4X5 VEL STRL LF (GAUZE/BANDAGES/DRESSINGS) ×4 IMPLANT
BANDAGE ACE 6X5 VEL STRL LF (GAUZE/BANDAGES/DRESSINGS) ×3 IMPLANT
BIT DRILL 2.5X2.75 QC CALB (BIT) ×2 IMPLANT
BIT DRILL 3.5X5.5 QC CALB (BIT) ×2 IMPLANT
BIT DRILL CALIBRATED 2.7 (BIT) ×1 IMPLANT
BIT DRILL CALIBRATED 2.7MM (BIT) ×1
BLADE SURG SZ10 CARB STEEL (BLADE) ×6 IMPLANT
BNDG COHESIVE 4X5 TAN STRL (GAUZE/BANDAGES/DRESSINGS) ×3 IMPLANT
BNDG ESMARK 6X12 TAN STRL LF (GAUZE/BANDAGES/DRESSINGS) ×3 IMPLANT
BNDG PLASTER FAST 4X5 WHT LF (CAST SUPPLIES) ×4 IMPLANT
BNDG PLSTR 5X4 FST ST WHT LF (CAST SUPPLIES)
CANISTER SUCT 1200ML W/VALVE (MISCELLANEOUS) ×3 IMPLANT
CHLORAPREP W/TINT 26ML (MISCELLANEOUS) ×6 IMPLANT
COVER WAND RF STERILE (DRAPES) ×1 IMPLANT
CUFF TOURN 18 STER (MISCELLANEOUS) ×2 IMPLANT
CUFF TOURN 24 STER (MISCELLANEOUS) IMPLANT
CUFF TOURN 30 STER DUAL PORT (MISCELLANEOUS) IMPLANT
DECANTER SPIKE VIAL GLASS SM (MISCELLANEOUS) ×2 IMPLANT
DRAPE C-ARM XRAY 36X54 (DRAPES) ×3 IMPLANT
DRAPE C-ARMOR (DRAPES) ×3 IMPLANT
DRAPE INCISE IOBAN 66X45 STRL (DRAPES) ×3 IMPLANT
DRAPE U-SHAPE 47X51 STRL (DRAPES) ×3 IMPLANT
ELECT CAUTERY BLADE 6.4 (BLADE) ×3 IMPLANT
ELECT REM PT RETURN 9FT ADLT (ELECTROSURGICAL) ×3
ELECTRODE REM PT RTRN 9FT ADLT (ELECTROSURGICAL) ×1 IMPLANT
GAUZE PETRO XEROFOAM 1X8 (MISCELLANEOUS) ×3 IMPLANT
GAUZE SPONGE 4X4 12PLY STRL (GAUZE/BANDAGES/DRESSINGS) ×3 IMPLANT
GAUZE XEROFORM 1X8 LF (GAUZE/BANDAGES/DRESSINGS) ×2 IMPLANT
GLOVE BIO SURGEON STRL SZ8 (GLOVE) ×6 IMPLANT
GLOVE INDICATOR 8.0 STRL GRN (GLOVE) ×5 IMPLANT
GOWN STRL REUS W/ TWL LRG LVL3 (GOWN DISPOSABLE) ×1 IMPLANT
GOWN STRL REUS W/ TWL XL LVL3 (GOWN DISPOSABLE) ×1 IMPLANT
GOWN STRL REUS W/TWL LRG LVL3 (GOWN DISPOSABLE) ×3
GOWN STRL REUS W/TWL XL LVL3 (GOWN DISPOSABLE) ×3
HEMOVAC 400ML (MISCELLANEOUS)
K-WIRE ACE 1.6X6 (WIRE) ×3
KIT DRAIN HEMOVAC JP 7FR 400ML (MISCELLANEOUS) ×1 IMPLANT
KIT TURNOVER KIT A (KITS) ×3 IMPLANT
KWIRE ACE 1.6X6 (WIRE) IMPLANT
LABEL OR SOLS (LABEL) ×1 IMPLANT
NS IRRIG 1000ML POUR BTL (IV SOLUTION) ×3 IMPLANT
PACK EXTREMITY ARMC (MISCELLANEOUS) ×3 IMPLANT
PAD ABD DERMACEA PRESS 5X9 (GAUZE/BANDAGES/DRESSINGS) ×6 IMPLANT
PAD CAST CTTN 4X4 STRL (SOFTGOODS) ×2 IMPLANT
PAD PREP 24X41 OB/GYN DISP (PERSONAL CARE ITEMS) ×3 IMPLANT
PADDING CAST COTTON 4X4 STRL (SOFTGOODS) ×6
PLATE LOCK 7H 92 BILAT FIB (Plate) ×2 IMPLANT
SCREW CORTICAL 3.5MM  28MM (Screw) ×2 IMPLANT
SCREW CORTICAL 3.5MM 28MM (Screw) IMPLANT
SCREW LOCK 3.5X14 DIST TIB (Screw) ×2 IMPLANT
SCREW LOCK CORT STAR 3.5X12 (Screw) ×2 IMPLANT
SCREW LOCK CORT STAR 3.5X14 (Screw) ×2 IMPLANT
SCREW NON LOCKING LP 3.5 14MM (Screw) ×4 IMPLANT
SCREW NON LOCKING LP 3.5 16MM (Screw) ×2 IMPLANT
SLEEVE SCD COMPRESS THIGH MED (MISCELLANEOUS) ×2 IMPLANT
SPLINT CAST 1 STEP 4X30 (MISCELLANEOUS) ×4 IMPLANT
SPONGE LAP 18X18 RF (DISPOSABLE) ×3 IMPLANT
STAPLER SKIN PROX 35W (STAPLE) ×3 IMPLANT
STOCKINETTE IMPERV 14X48 (MISCELLANEOUS) ×3 IMPLANT
SUT VIC AB 0 CT1 36 (SUTURE) ×3 IMPLANT
SUT VIC AB 2-0 SH 27 (SUTURE) ×6
SUT VIC AB 2-0 SH 27XBRD (SUTURE) ×2 IMPLANT
SUT VIC AB 3-0 SH 27 (SUTURE) ×3
SUT VIC AB 3-0 SH 27X BRD (SUTURE) IMPLANT
SYR 10ML LL (SYRINGE) ×3 IMPLANT

## 2018-11-21 NOTE — H&P (Signed)
Subjective:  Chief complaint: Right ankle pain.  The patient is a 73 y.o. female with a history of depression, ankylosing spondylitis with lumbar stenosis, and a possible colon cancer who lives independently on her own.  The patient was in her usual state of health until last evening.  Apparently, she went to her mailbox to get the mail when she slipped in the snow and fell backwards, injuring her right ankle.  She was unable to get up.  She tried calling for help.  After 15 minutes or so, a neighbor came by and help her to her house, but she refused to go to the emergency room.  Because of continued pain and swelling, she finally contacted emergency medical services who brought her to the emergency room.  X-rays in the emergency room demonstrated a posterior fracture dislocation of her right ankle.  She denies any numbness or paresthesias to her toes.  In addition, the patient denies any associated injury resulting from the fall.  She did not strike her head or lose consciousness.  She also denies any light-headedness, dizziness, chest pain, or shortness of breath which might have contributed to the injury.  Patient Active Problem List   Diagnosis Date Noted  . Ankylosing spondylitis of multiple sites in spine ([place]) 11/21/2018  . Severe episode of recurrent major depressive disorder, without psychotic features ([place]) 11/21/2018  . Benign neoplasm of ascending colon   . Malignant neoplasm of sigmoid colon ([place])   . Melanosis, colon   . Special screening for malignant neoplasms, colon   . Maculopapular rash   . Colon neoplasm   . Melena   . Columnar epithelial-lined lower esophagus   . Stomach irritation   . IFG (impaired fasting glucose) 04/22/2018  . Ataxia 03/04/2018  . Atypical chest pain 03/04/2018  . SOB (shortness of breath) 01/26/2018  . Laryngopharyngeal reflux (LPR) 08/16/2017  . Suspected exposure to mold 08/05/2017  . Poor historian 08/05/2017  . Lumbar foraminal stenosis  (Bilateral: L4-5) (Left: L5-S1) 07/30/2017  . Osteopenia of spine 07/30/2017  . PTSD (post-traumatic stress disorder) 06/20/2017  . Cervical arthritis 06/20/2017  . Bronchial asthma 06/20/2017  . Long term current use of opiate analgesic 06/11/2017  . Chronic pain syndrome 06/11/2017  . Anxiety   . Depression   . GERD (gastroesophageal reflux disease)   . Benign hypertensive renal disease   . History of rotator cuff repair (Right) 03/26/2017   Past Medical History:  Diagnosis Date  . Anxiety   . Arthritis   . Cervical central spinal stenosis (C3-C7) (worse at C4-5) 07/30/2017  . Cervical foraminal stenosis (C4-5 and C5-6) (Bilateral) (L>R) 07/30/2017  . Chronic lower extremity pain (Fourth Area of Pain) (Bilateral) (R>L) 06/11/2017  . Chronic neck pain (Primary Area of Pain) (Bilateral) (R>L) 06/11/2017  . Chronic sacroiliac joint pain (Bilateral) (R>L) 06/11/2017  . Chronic shoulder pain [facility] Area of Pain) (Right) 06/11/2017  . Chronic upper extremity pain (Fifth Area of Pain) (Bilateral) (R>L) 06/11/2017  . DDD (degenerative disc disease), cervical 07/30/2017  . DDD (degenerative disc disease), lumbar 07/30/2017  . Depression   . DISH (diffuse idiopathic skeletal hyperostosis) 07/30/2017  . Dyspnea    with exertion  . Entrapment syndrome 06/20/2017   2002 on R and 2010 on L  . Full thickness rotator cuff tear 06/12/2017  . GERD (gastroesophageal reflux disease)   . Grade 1 Anterolisthesis of L4 over L5 07/30/2017  . Headache   . Hx: UTI (urinary tract infection)   . Hypertension   .  Inflammation of joint of shoulder region 01/15/2017  . Lumbar central spinal stenosis (L4-5) 07/30/2017  . Lumbar disc protrusion (Left: L5-S1) (Right: L1-2) 07/30/2017   L5-S1 left foraminal protrusion with L5 impingement. L1-2 right paracentral protrusion without impingement.  . Lumbar facet arthropathy (Bilateral) 07/30/2017  . Lumbar facet syndrome (Bilateral) (R>L) 07/30/2017  .  Osteoarthritis of shoulder (Right) 01/15/2017    Past Surgical History:  Procedure Laterality Date  . ABDOMINAL HYSTERECTOMY    . APPENDECTOMY    . BREAST REDUCTION SURGERY Bilateral   . COLONOSCOPY WITH PROPOFOL N/A 11/05/2018   Procedure: COLONOSCOPY WITH PROPOFOL;  Surgeon: Virgel Manifold, MD;  Location: [facility] ENDOSCOPY;  Service: Endoscopy;  Laterality: N/A;  . COLONOSCOPY WITH PROPOFOL N/A 11/06/2018   Procedure: COLONOSCOPY WITH PROPOFOL;  Surgeon: Virgel Manifold, MD;  Location: [facility] ENDOSCOPY;  Service: Endoscopy;  Laterality: N/A;  . DILATION AND CURETTAGE OF UTERUS    . ESOPHAGOGASTRODUODENOSCOPY (EGD) WITH PROPOFOL N/A 11/05/2018   Procedure: ESOPHAGOGASTRODUODENOSCOPY (EGD) WITH PROPOFOL;  Surgeon: Virgel Manifold, MD;  Location: [facility] ENDOSCOPY;  Service: Endoscopy;  Laterality: N/A;  . REDUCTION MAMMAPLASTY    . SHOULDER ARTHROSCOPY WITH OPEN ROTATOR CUFF REPAIR AND DISTAL CLAVICLE ACROMINECTOMY Right 03/26/2017   Procedure: right shoulder arthroscopy, arthroscopic subacromial decompression, distal clavicle excision, mini open rotator cuff repair;  Surgeon: Thornton Park, MD;  Location: [facility] ORS;  Service: Orthopedics;  Laterality: Right;    (Not in a hospital admission)  Allergies  Allergen Reactions  . Ace Inhibitors Swelling  . Angiotensin Receptor Blockers Swelling  . Sulfa Antibiotics Itching  . Chlorthalidone Rash  . Flexeril [Cyclobenzaprine] Rash    Social History   Tobacco Use  . Smoking status: Former Smoker    Packs/day: 1.00    Types: Cigarettes    Last attempt to quit: 2010    Years since quitting: 10.1  . Smokeless tobacco: Never Used  . Tobacco comment: quit over 30 years ago   Substance Use Topics  . Alcohol use: No    Family History  Problem Relation Age of Onset  . Hypertension Mother   . Breast cancer Mother 58  . Breast cancer Sister 23     Review of Systems: As noted above. The patient denies any chest pain, shortness of  breath, nausea, vomiting, diarrhea, constipation, belly pain, blood in his/her stool, or burning with urination.  Objective: Temp:  [98.9 F (37.2 C)] 98.9 F (37.2 C) (02/21 1513) Pulse Rate:  [94] 94 (02/21 1513) BP: (224)/(92) 224/92 (02/21 1513) SpO2:  [99 %] 99 % (02/21 1513) Weight:  [71.7 kg] 71.7 kg (02/21 1514)  Physical Exam: General:  Alert, no acute distress Psychiatric:  Patient is competent for consent with normal mood and affect  Cardiovascular:  RRR  Respiratory:  Clear to auscultation. No wheezing. Non-labored breathing GI:  Abdomen is soft and non-tender Skin:  No lesions in the area of chief complaint Neurologic:  Sensation intact distally Lymphatic:  No axillary or cervical lymphadenopathy  Orthopedic Exam:  Orthopedic examination is limited to the right lower leg and foot.  There is moderate swelling around the ankle with some deformity, but there are no ecchymosis, abrasions, or other skin abnormalities identified.  She has significant tenderness to palpation over the anterior, medial, and lateral aspects of the ankle.  She is able to dorsiflex and plantarflex her toes but refuses to try to move her ankle.  Sensations intact light touch to all distributions.  She has good capillary refill to  her toes.  Imaging Review: Recent x-rays of the right ankle are available for review.  These films demonstrate a posterior fracture dislocation of the ankle with a displaced distal fibular fracture.  There is probably a small posterior lip fracture of the distal tibia, but the medial malleolus appears to be intact.  No significant degenerative changes of the ankle joint are noted at this time.  No lytic lesions are identified.  Assessment: Closed bimalleolar fracture dislocation, right ankle.  Plan: The treatment options, including both surgical and nonsurgical choices, have been discussed in detail with the patient. The patient would like to proceed with surgical intervention  to include an open reduction and internal fixation of the right ankle fracture dislocation. The risks (including bleeding, infection, nerve and/or blood vessel injury, persistent or recurrent pain, malunion and/or nonunion, ankle stiffness, degenerative joint disease, need for further surgery, blood clots, strokes, heart attacks or arrhythmias, pneumonia, etc.) and benefits of the surgical procedure were discussed. The patient states her understanding and agrees to proceed. A formal written consent has been obtained.

## 2018-11-21 NOTE — Op Note (Signed)
11/21/2018  11:13 PM  Patient:   Gloria Allen  Pre-Op Diagnosis:   Closed displaced posterior fracture dislocation, right ankle.  Post-Op Diagnosis:   Same.  Procedure:   Open reduction and internal fixation of displaced distal fibular fracture, right ankle.  Surgeon:   Pascal Lux, MD  Assistant:   None  Anesthesia:   Spinal  Findings:   As above.  Complications:   None  EBL:   5 cc  Fluids:   300 cc crystalloid  UOP:   None  TT:   55 min at 250 mmHg  Drains:   None  Closure:   Staples  Implants:   Biomet ALPS 7-hole composite locking plate and screws  Brief Clinical Note:   The patient is a 73 year old female who sustained the above-noted injury yesterday evening when she slipped and fell while going to her mailbox. She did not seek treatment immediately, but because of continued pain, finally presented to the emergency room where x-rays demonstrated a fracture dislocation of her right ankle. The patient presents at this time for definitive management of his/her injury.  Procedure:   The patient was brought into the operating room. After adequate spinal anesthesia was obtained, the patient was lain in the supine position. The right foot and lower leg were prepped with ChloraPrep solution, then draped sterilely. Preoperative antibiotics were administered. A timeout was performed to verify the appropriate surgical site before the limb was exsanguinated with an Esmarch and the calf tourniquet inflated to 250 mmHg. Laterally, an 8-10 cm incision was made over the lateral aspect of the distal fibula. The incision was carried down through the subcutaneous tissues to expose the fracture site. The fracture hematoma was debrided before the fracture was reduced and temporarily secured using a bone clamp. A lag screw was placed in an anterior to posterior direction perpendicular to the fracture. A 7-hole Biomet composite locking plate was contoured using the appropriate plate  benders before it was applied over the lateral aspect of the distal fibula. After verifying its position fluoroscopically, it was secured using a 3.5 mm nonlocking cortical screw proximal to the fracture. Again the plate's position was adjusted slightly based on AP and lateral projections before it was secured using additional bicortical screws proximally and multiple locking screws distally. The adequacy of fracture reduction and hardware position was verified fluoroscopically in AP and lateral projections and found to be excellent.   The wound was copiously irrigated with sterile saline solution before the subcutaneous tissues were closed in two layers using 2-0 and 3-0 Vicryl interrupted sutures.  The skin was closed using staples. A total of 20 cc of 0.5% plain Sensorcaine was injected in and around the incision site to help with postoperative analgesia. A sterile bulky dressing was applied to the wound before the patient was placed into a posterior splint with a sugar tong supplement, maintaining the ankle in neutral dorsiflexion. The patient was then awakened and returned to the recovery room in satisfactory condition after tolerating the procedure well.

## 2018-11-21 NOTE — Anesthesia Post-op Follow-up Note (Signed)
Anesthesia QCDR form completed.        

## 2018-11-21 NOTE — ED Triage Notes (Signed)
First NUrse Note:  Arrives via [facility].   Per report, patient fell yesterday while going to the mailbox.  Right ankle injury--right ankle externally rotated.

## 2018-11-21 NOTE — Anesthesia Preprocedure Evaluation (Signed)
Anesthesia Evaluation  Patient identified by MRN, date of birth, ID band Patient awake    Reviewed: Allergy & Precautions, NPO status , Patient's Chart, lab work & pertinent test results, reviewed documented beta blocker date and time   Airway Mallampati: III  TM Distance: >3 FB     Dental  (+) Upper Dentures, Lower Dentures   Pulmonary shortness of breath, asthma , former smoker,           Cardiovascular hypertension, Pt. on medications and Pt. on home beta blockers      Neuro/Psych  Headaches, PSYCHIATRIC DISORDERS Anxiety Depression  Neuromuscular disease    GI/Hepatic GERD  Controlled,  Endo/Other    Renal/GU Renal disease     Musculoskeletal  (+) Arthritis ,   Abdominal   Peds  Hematology   Anesthesia Other Findings Obese. Neck movement ok. EKG one yr ago ok.  Reproductive/Obstetrics                             Anesthesia Physical Anesthesia Plan  ASA: III  Anesthesia Plan: Spinal   Post-op Pain Management:    Induction:   PONV Risk Score and Plan:   Airway Management Planned:   Additional Equipment:   Intra-op Plan:   Post-operative Plan:   Informed Consent: I have reviewed the patients History and Physical, chart, labs and discussed the procedure including the risks, benefits and alternatives for the proposed anesthesia with the patient or authorized representative who has indicated his/her understanding and acceptance.       Plan Discussed with: CRNA  Anesthesia Plan Comments:         Anesthesia Quick Evaluation

## 2018-11-21 NOTE — ED Provider Notes (Signed)
[facility] Emergency Department Provider Note  ____________________________________________  Time seen: Approximately 5:08 PM  I have reviewed the triage vital signs and the nursing notes.   HISTORY  Chief Complaint Ankle Injury   HPI Gloria Allen is a 73 y.o. female who presents for evaluation of right ankle pain and swelling.  Patient reports that she went to her mailbox yesterday evening and slipped on the snow and fell on the ground.  She heard a crack on her bone.  Her neighbor helped her into the house.  She did not call 911.  She try to stay home but she has been unable to walk.  This afternoon she called 911 as the pain was getting worse.  She is complaining of severe constant sharp pain located in her right ankle associated with swelling.  She denies head trauma or LOC.  No headache, back pain, neck pain or any other extremity pain.  Patient is not on blood thinners.  Past Medical History:  Diagnosis Date  . Anxiety   . Arthritis   . Cervical central spinal stenosis (C3-C7) (worse at C4-5) 07/30/2017  . Cervical foraminal stenosis (C4-5 and C5-6) (Bilateral) (L>R) 07/30/2017  . Chronic lower extremity pain (Fourth Area of Pain) (Bilateral) (R>L) 06/11/2017  . Chronic neck pain (Primary Area of Pain) (Bilateral) (R>L) 06/11/2017  . Chronic sacroiliac joint pain (Bilateral) (R>L) 06/11/2017  . Chronic shoulder pain [facility] Area of Pain) (Right) 06/11/2017  . Chronic upper extremity pain (Fifth Area of Pain) (Bilateral) (R>L) 06/11/2017  . DDD (degenerative disc disease), cervical 07/30/2017  . DDD (degenerative disc disease), lumbar 07/30/2017  . Depression   . DISH (diffuse idiopathic skeletal hyperostosis) 07/30/2017  . Dyspnea    with exertion  . Entrapment syndrome 06/20/2017   2002 on R and 2010 on L  . Full thickness rotator cuff tear 06/12/2017  . GERD (gastroesophageal reflux disease)   . Grade 1 Anterolisthesis of L4 over L5 07/30/2017  .  Headache   . Hx: UTI (urinary tract infection)   . Hypertension   . Inflammation of joint of shoulder region 01/15/2017  . Lumbar central spinal stenosis (L4-5) 07/30/2017  . Lumbar disc protrusion (Left: L5-S1) (Right: L1-2) 07/30/2017   L5-S1 left foraminal protrusion with L5 impingement. L1-2 right paracentral protrusion without impingement.  . Lumbar facet arthropathy (Bilateral) 07/30/2017  . Lumbar facet syndrome (Bilateral) (R>L) 07/30/2017  . Osteoarthritis of shoulder (Right) 01/15/2017    Patient Active Problem List   Diagnosis Date Noted  . Ankylosing spondylitis of multiple sites in spine ([place]) 11/21/2018  . Severe episode of recurrent major depressive disorder, without psychotic features ([place]) 11/21/2018  . Ankle fracture, right 11/21/2018  . Benign neoplasm of ascending colon   . Malignant neoplasm of sigmoid colon ([place])   . Melanosis, colon   . Special screening for malignant neoplasms, colon   . Maculopapular rash   . Colon neoplasm   . Melena   . Columnar epithelial-lined lower esophagus   . Stomach irritation   . IFG (impaired fasting glucose) 04/22/2018  . Ataxia 03/04/2018  . Atypical chest pain 03/04/2018  . SOB (shortness of breath) 01/26/2018  . Laryngopharyngeal reflux (LPR) 08/16/2017  . Suspected exposure to mold 08/05/2017  . Poor historian 08/05/2017  . Lumbar foraminal stenosis (Bilateral: L4-5) (Left: L5-S1) 07/30/2017  . Osteopenia of spine 07/30/2017  . PTSD (post-traumatic stress disorder) 06/20/2017  . Cervical arthritis 06/20/2017  . Bronchial asthma 06/20/2017  . Long term current  use of opiate analgesic 06/11/2017  . Chronic pain syndrome 06/11/2017  . Anxiety   . Depression   . GERD (gastroesophageal reflux disease)   . Benign hypertensive renal disease   . History of rotator cuff repair (Right) 03/26/2017    Past Surgical History:  Procedure Laterality Date  . ABDOMINAL HYSTERECTOMY    . APPENDECTOMY    . BREAST REDUCTION SURGERY  Bilateral   . COLONOSCOPY WITH PROPOFOL N/A 11/05/2018   Procedure: COLONOSCOPY WITH PROPOFOL;  Surgeon: Virgel Manifold, MD;  Location: [facility] ENDOSCOPY;  Service: Endoscopy;  Laterality: N/A;  . COLONOSCOPY WITH PROPOFOL N/A 11/06/2018   Procedure: COLONOSCOPY WITH PROPOFOL;  Surgeon: Virgel Manifold, MD;  Location: [facility] ENDOSCOPY;  Service: Endoscopy;  Laterality: N/A;  . DILATION AND CURETTAGE OF UTERUS    . ESOPHAGOGASTRODUODENOSCOPY (EGD) WITH PROPOFOL N/A 11/05/2018   Procedure: ESOPHAGOGASTRODUODENOSCOPY (EGD) WITH PROPOFOL;  Surgeon: Virgel Manifold, MD;  Location: [facility] ENDOSCOPY;  Service: Endoscopy;  Laterality: N/A;  . REDUCTION MAMMAPLASTY    . SHOULDER ARTHROSCOPY WITH OPEN ROTATOR CUFF REPAIR AND DISTAL CLAVICLE ACROMINECTOMY Right 03/26/2017   Procedure: right shoulder arthroscopy, arthroscopic subacromial decompression, distal clavicle excision, mini open rotator cuff repair;  Surgeon: Thornton Park, MD;  Location: [facility] ORS;  Service: Orthopedics;  Laterality: Right;    Prior to Admission medications   Medication Sig Start Date End Date Taking? Authorizing Provider  BREO ELLIPTA 200-25 MCG/INH AEPB Inhale 1 puff into the lungs daily.  08/11/18  Yes [provider]  clonazePAM (KLONOPIN) 0.5 MG tablet Take 0.5 mg by mouth 3 (three) times daily.    Yes [provider]  cloNIDine (CATAPRES) 0.3 MG tablet Take 2 tablets (0.6 mg total) by mouth 2 (two) times daily. Patient taking differently: Take 0.3 mg by mouth daily.  05/19/18  Yes Volney American, PA-C  cyclobenzaprine (FLEXERIL) 5 MG tablet Take 5 mg by mouth at bedtime.   Yes [provider]  hydrALAZINE (APRESOLINE) 100 MG tablet Take 1 tablet (100 mg total) by mouth 3 (three) times daily. 04/22/18  Yes Johnson, Megan P, DO  metoprolol succinate (TOPROL-XL) 50 MG 24 hr tablet Take 50 mg by mouth daily. Take with or immediately following a meal.   Yes [provider]   mirtazapine (REMERON) 15 MG tablet take 1 tablet by mouth at bedtime for APPETITIE Patient taking differently: Take 15 mg by mouth at bedtime. take 1 tablet by mouth at bedtime for APPETITIE 04/24/18  Yes Johnson, Megan P, DO  montelukast (SINGULAIR) 10 MG tablet Take 1 tablet (10 mg total) by mouth at bedtime as needed. 04/02/18  Yes Kathrine Haddock, NP  omeprazole (PRILOSEC) 40 MG capsule Take 1 capsule (40 mg total) by mouth 2 (two) times daily. 04/24/18  Yes Johnson, Megan P, DO  Potassium 99 MG TABS Take 1 tablet by mouth daily as needed (cramps).    Yes [provider]  sertraline (ZOLOFT) 100 MG tablet Take 2 tablets (200 mg total) by mouth at bedtime. 04/24/18  Yes Johnson, Megan P, DO  traZODone (DESYREL) 50 MG tablet Take 0.5-1 tablets (25-50 mg total) by mouth at bedtime as needed for sleep. Patient taking differently: Take 50 mg by mouth at bedtime.  04/24/18  Yes Johnson, Megan P, DO  albuterol (PROVENTIL HFA;VENTOLIN HFA) 108 (90 Base) MCG/ACT inhaler Inhale 2 puffs into the lungs every 6 (six) hours as needed for wheezing or shortness of breath. 04/02/18   Kathrine Haddock, NP  Jearl Klinefelter ELLIPTA 62.5-25  MCG/INH AEPB INHALE 1 PUFF BY MOUTH DAILY Patient taking differently: Inhale 1 puff into the lungs daily.  08/25/18   Johnson, Megan P, DO  bisacodyl (BISACODYL) 5 MG EC tablet Take 4 tablets (5 mg each) at 8 am the day before surgery. 11/12/18   Pabon, [place], MD  Erythromycin 500 MG TBEC Take 500 mg by mouth as directed. Take two tablets at 8 am, two tablets at 2 pm, and two tablets at 8 pm the day before surgery. 11/12/18   Pabon, [place], MD  fluconazole (DIFLUCAN) 200 MG tablet Take 2 tablets (400 mg total) by mouth daily for 1 day, THEN 1 tablet (200 mg total) daily for 14 days. Patient taking differently: Take 200 mg by mouth daily for 14 days. 11/07/18 11/22/18  Virgel Manifold, MD  hydrALAZINE (APRESOLINE) 50 MG tablet TAKE 1 TABLET BY MOUTH THREE TIMES DAILY Patient not taking:  Reported on 11/17/2018 08/25/18   Park Liter P, DO  hydrOXYzine (ATARAX/VISTARIL) 10 MG tablet TAKE 1 TABLET BY MOUTH THREE TIMES A DAY AS NEEDED Patient taking differently: Take 10 mg by mouth 3 (three) times daily as needed for itching.  06/22/18   Johnson, Megan P, DO  lovastatin (MEVACOR) 40 MG tablet Take 1 tablet (40 mg total) by mouth at bedtime. Patient not taking: Reported on 11/17/2018 04/24/18   Park Liter P, DO  meclizine (ANTIVERT) 12.5 MG tablet Take 12.5 mg by mouth 3 (three) times daily as needed for dizziness.  08/20/18   [provider]  methocarbamol (ROBAXIN) 500 MG tablet Take 1 tablet (500 mg total) by mouth at bedtime as needed for muscle spasms. Patient not taking: Reported on 11/21/2018 05/19/18   Volney American, PA-C  metoprolol succinate (TOPROL-XL) 100 MG 24 hr tablet TAKE 1 TABLET BY MOUTH DAILY Patient not taking: Reported on 11/17/2018 06/22/18   Park Liter P, DO  neomycin (MYCIFRADIN) 500 MG tablet Take two tablets at 8 am, two tablets at 2 pm, and two tablets at 8 pm the day before surgery. 11/12/18   Pabon, [place], MD  nitrofurantoin (MACRODANTIN) 100 MG capsule Take 100 mg by mouth daily.     [provider]  oxybutynin (DITROPAN) 5 MG tablet TAKE 1 TABLET BY MOUTH TWICE DAILY Patient taking differently: Take 5 mg by mouth 2 (two) times daily.  08/25/18   Park Liter P, DO  polyethylene glycol powder (GLYCOLAX/MIRALAX) powder 255 grams one bottle for bowel prep 11/12/18   Pabon, [place] F, MD  terazosin (HYTRIN) 5 MG capsule TAKE 1 CAPSULE BY MOUTH ONCE DAILY AT BEDTIME Patient not taking: Reported on 11/17/2018 06/22/18   Park Liter P, DO  triamcinolone cream (KENALOG) 0.1 % APPLY TOPICALLY TWICE DAILY AS NEEDED FOR ITCHING Patient taking differently: Apply 1 application topically 2 (two) times daily as needed (itching).  06/22/18   Park Liter P, DO    Allergies Ace inhibitors; Angiotensin receptor blockers; Sulfa  antibiotics; Chlorthalidone; and Flexeril [cyclobenzaprine]  Family History  Problem Relation Age of Onset  . Hypertension Mother   . Breast cancer Mother 14  . Breast cancer Sister 50    Social History Social History   Tobacco Use  . Smoking status: Former Smoker    Packs/day: 1.00    Types: Cigarettes    Last attempt to quit: 2010    Years since quitting: 10.1  . Smokeless tobacco: Never Used  . Tobacco comment: quit over 30 years ago   Substance Use Topics  .  Alcohol use: No  . Drug use: No    Review of Systems Constitutional: Negative for fever. Eyes: Negative for visual changes. ENT: Negative for facial injury or neck injury Cardiovascular: Negative for chest injury. Respiratory: Negative for shortness of breath. Negative for chest wall injury. Gastrointestinal: Negative for abdominal pain or injury. Genitourinary: Negative for dysuria. Musculoskeletal: Negative for back injury, + R ankle pain and swelling Skin: Negative for laceration/abrasions. Neurological: Negative for head injury.   ____________________________________________   PHYSICAL EXAM:  VITAL SIGNS: ED Triage Vitals  Enc Vitals Group     BP 11/21/18 1513 (!) 224/92     Pulse Rate 11/21/18 1513 94     Resp --      Temp 11/21/18 1513 98.9 F (37.2 C)     Temp Source 11/21/18 1513 Oral     SpO2 11/21/18 1513 99 %     Weight 11/21/18 1514 158 lb (71.7 kg)     Height 11/21/18 1514 5\' 5"  (1.651 m)     Head Circumference --      Peak Flow --      Pain Score 11/21/18 1523 9     Pain Loc --      Pain Edu? --      Excl. in [place]? --     Constitutional: Alert and oriented. No acute distress. Does not appear intoxicated. HEENT Head: Normocephalic and atraumatic. Face: No facial bony tenderness. Stable midface Ears: No hemotympanum bilaterally. No Battle sign Eyes: No eye injury. PERRL. No raccoon eyes Nose: Nontender. No epistaxis. No rhinorrhea Mouth/Throat: Mucous membranes are moist. No  oropharyngeal blood. No dental injury. Airway patent without stridor. Normal voice. Neck: no C-collar in place. No midline c-spine tenderness.  Cardiovascular: Normal rate, regular rhythm. Normal and symmetric distal pulses are present in all extremities. Pulmonary/Chest: Chest wall is stable and nontender to palpation/compression. Normal respiratory effort. Breath sounds are normal. No crepitus.  Abdominal: Soft, nontender, non distended. Musculoskeletal: Swelling of the R ankle with diffuse tenderness to palpation. Nontender with normal full range of motion in all other extremities. No deformities. No thoracic or lumbar midline spinal tenderness. Pelvis is stable. Skin: Skin is warm, dry and intact. No abrasions or contutions. Psychiatric: Speech and behavior are appropriate. Neurological: Normal speech and language. Moves all extremities to command. No gross focal neurologic deficits are appreciated.  Glascow Coma Score: 4 - Opens eyes on own 6 - Follows simple motor commands 5 - Alert and oriented GCS: 15   ____________________________________________   LABS (all labs ordered are listed, but only abnormal results are displayed)  Labs Reviewed  CBC - Abnormal; Notable for the following components:      Result Value   WBC 12.9 (*)    All other components within normal limits  BASIC METABOLIC PANEL - Abnormal; Notable for the following components:   Glucose, Bld 139 (*)    All other components within normal limits  PROTIME-INR  TYPE AND SCREEN  TYPE AND SCREEN   ____________________________________________  EKG  ED ECG REPORT I, Rudene Re, the attending physician, personally viewed and interpreted this ECG.    ____________________________________________  RADIOLOGY  I have personally reviewed the images performed during this visit and I agree with the Radiologist's read.   Interpretation by Radiologist:  Dg Ankle Complete Right  Result Date:  11/21/2018 CLINICAL DATA:  Acute right ankle pain and swelling after fall. EXAM: RIGHT ANKLE - COMPLETE 3+ VIEW COMPARISON:  None. FINDINGS: Posterior dislocation of the talus with  respect to the distal tibia. Posteriorly displaced and impacted fracture of the distal fibula. Probable posterior malleolar fracture. Postsurgical changes of the distal first metatarsal. Osteopenia. Diffuse soft tissue swelling about the ankle. IMPRESSION: 1. Distal fibular fracture with posterior dislocation of the tibiotalar joint. 2. Probable posterior malleolus fracture. Electronically Signed   By: Titus Dubin M.D.   On: 11/21/2018 15:44   Dg Chest Portable 1 View  Result Date: 11/21/2018 CLINICAL DATA:  Preop exam EXAM: PORTABLE CHEST 1 VIEW COMPARISON:  01/15/2018 FINDINGS: Cardiac enlargement without heart failure. Lungs are clear without infiltrate or effusion. IMPRESSION: No active disease. Electronically Signed   By: Franchot Gallo M.D.   On: 11/21/2018 19:11     ____________________________________________   PROCEDURES  Procedure(s) performed: None Procedures Critical Care performed:  None ____________________________________________   INITIAL IMPRESSION / ASSESSMENT AND PLAN / ED COURSE  73 y.o. female who presents for evaluation of right ankle pain and swelling s/p mechanical fall.  X-ray showing distal fibular fracture with posterior dislocation of the tibiotalar joint.  Discussed with Dr. Roland Rack who is evaluating patient at this time for possible surgical repair. Labs for medical clearance pending. Will give morphine for pain. No head trauma, no other injuries based on history and physical exam.   Clinical Course as of Nov 21 1952  Fri Nov 21, 2018  1808 Patient evaluated by Dr. pathology who will plan to take patient to the operating room for surgical repair this evening.  Labs for medical clearance with no significant abnormalities.   [CV]    Clinical Course User Index [CV] Alfred Levins  [place], MD   _________________________ 7:53 PM on 11/21/2018 -----------------------------------------  Her blood pressure is elevated and she has not taken her evening dose of clonidine which will be given to her right now.  She remains n.p.o. for surgery this evening.  As part of my medical decision making, I reviewed the following data within the [place] notes reviewed and incorporated, Labs reviewed , EKG interpreted , Old chart reviewed, Radiograph reviewed , A consult was requested and obtained from this/these consultant(s) Ortho, Notes from prior ED visits and [place] Controlled Substance Database    Pertinent labs & imaging results that were available during my care of the patient were reviewed by me and considered in my medical decision making (see chart for details).    ____________________________________________   FINAL CLINICAL IMPRESSION(S) / ED DIAGNOSES  Final diagnoses:  Ankle dislocation, left, initial encounter  Closed fracture of distal end of left fibula, unspecified fracture morphology, initial encounter      NEW MEDICATIONS STARTED DURING THIS VISIT:  ED Discharge Orders    None       Note:  This document was prepared using Dragon voice recognition software and may include unintentional dictation errors.    Alfred Levins [place], MD 11/21/18 [phone]

## 2018-11-21 NOTE — Anesthesia Procedure Notes (Signed)
Spinal  Patient location during procedure: OR Staffing Anesthesiologist: Gunnar Bulla, MD Performed: anesthesiologist  Preanesthetic Checklist Completed: patient identified, site marked, surgical consent, pre-op evaluation, timeout performed, IV checked and risks and benefits discussed Spinal Block Patient position: sitting Prep: ChloraPrep Patient monitoring: heart rate, cardiac monitor, continuous pulse ox and blood pressure Approach: midline Location: L3-4 Injection technique: single-shot Needle Needle type: Pencil-Tip  Needle gauge: 25 G Needle length: 9 cm Assessment Sensory level: T10

## 2018-11-21 NOTE — ED Triage Notes (Signed)
Patient reports she slipped on the ice last night checking her mail. Reports her right ankle got caught and twisted when fell. Patient's right ankle swollen.

## 2018-11-21 NOTE — ED Notes (Signed)
Tr[facility]port to [facility].AS

## 2018-11-21 NOTE — Transfer of Care (Signed)
Immediate Anesthesia Transfer of Care Note  Patient: Gloria Allen  Procedure(s) Performed: OPEN REDUCTION INTERNAL FIXATION (ORIF) ANKLE FRACTURE (Right )  Patient Location: PACU  Anesthesia Type:Spinal  Level of Consciousness: awake, alert , oriented and patient cooperative  Airway & Oxygen Therapy: Patient Spontanous Breathing  Post-op Assessment: Report given to RN and Post -op Vital signs reviewed and stable  Post vital signs: Reviewed and stable  Last Vitals:  Vitals Value Taken Time  BP 145/60 11/21/2018 11:16 PM  Temp 37.1 C 11/21/2018 11:15 PM  Pulse 57 11/21/2018 11:18 PM  Resp 16 11/21/2018 11:15 PM  SpO2 96 % 11/21/2018 11:18 PM  Vitals shown include unvalidated device data.  Last Pain:  Vitals:   11/21/18 2054  TempSrc:   PainSc: 4          Complications: No apparent anesthesia complications

## 2018-11-21 NOTE — Progress Notes (Signed)
PHARMACIST - PHYSICIAN ORDER COMMUNICATION  CONCERNING: P&T Medication Policy on Herbal Medications  DESCRIPTION:  This patient's order for:  Potassium tabs 99 mg  has been noted.  This product(s) is classified as an "herbal" or natural product. Due to a lack of definitive safety studies or FDA approval, nonstandard manufacturing practices, plus the potential risk of unknown drug-drug interactions while on inpatient medications, the Pharmacy and Therapeutics Committee does not permit the use of "herbal" or natural products of this type within [facility].   ACTION TAKEN: The pharmacy department is unable to verify this order at this time. Please reevaluate patient's clinical condition at discharge and address if the herbal or natural product(s) should be resumed at that time.

## 2018-11-22 ENCOUNTER — Encounter: Payer: Self-pay | Admitting: Surgery

## 2018-11-22 LAB — BASIC METABOLIC PANEL
Anion gap: 9 (ref 5–15)
BUN: 15 mg/dL (ref 8–23)
CO2: 27 mmol/L (ref 22–32)
Calcium: 8.9 mg/dL (ref 8.9–10.3)
Chloride: 103 mmol/L (ref 98–111)
Creatinine, Ser: 0.87 mg/dL (ref 0.44–1.00)
GFR calc Af Amer: >60 mL/min (ref >60)
GFR calc non Af Amer: >60 mL/min (ref >60)
Glucose, Bld: 97 mg/dL (ref 70–99)
Potassium: 3.8 mmol/L (ref 3.5–5.1)
Sodium: 139 mmol/L (ref 135–145)

## 2018-11-22 MED ORDER — ENOXAPARIN SODIUM 40 MG/0.4ML ~~LOC~~ SOLN: 40.0000 mg | SUBCUTANEOUS | 0 refills | Status: DC

## 2018-11-22 MED ORDER — KETOROLAC TROMETHAMINE 15 MG/ML IJ SOLN
7.5000 mg | Freq: Four times a day (QID) | INTRAMUSCULAR | Status: AC
  Administered 2018-11-22 (×2): 7.5 mg via INTRAVENOUS
  Filled 2018-11-22 (×4): qty 1

## 2018-11-22 MED ORDER — BISACODYL 10 MG RE SUPP: 10.0000 mg | Freq: Every day | RECTAL | Status: DC | PRN

## 2018-11-22 MED ORDER — CEFAZOLIN SODIUM-DEXTROSE 2-4 GM/100ML-% IV SOLN
2.0000 g | Freq: Four times a day (QID) | INTRAVENOUS | Status: DC
  Filled 2018-11-22 (×3): qty 100

## 2018-11-22 MED ORDER — ENOXAPARIN SODIUM 40 MG/0.4ML ~~LOC~~ SOLN
40.0000 mg | SUBCUTANEOUS | Status: DC
  Administered 2018-11-22 – 2018-11-24 (×3): 40 mg via SUBCUTANEOUS
  Filled 2018-11-22 (×3): qty 0.4

## 2018-11-22 MED ORDER — METOCLOPRAMIDE HCL 5 MG/ML IJ SOLN: 5.0000 mg | Freq: Three times a day (TID) | INTRAMUSCULAR | Status: DC | PRN

## 2018-11-22 MED ORDER — FLEET ENEMA 7-19 GM/118ML RE ENEM: 1.0000 | ENEMA | Freq: Once | RECTAL | Status: DC | PRN

## 2018-11-22 MED ORDER — METOCLOPRAMIDE HCL 10 MG PO TABS: 5.0000 mg | ORAL_TABLET | Freq: Three times a day (TID) | ORAL | Status: DC | PRN

## 2018-11-22 MED ORDER — MAGNESIUM HYDROXIDE 400 MG/5ML PO SUSP: 30.0000 mL | Freq: Every day | ORAL | Status: DC | PRN

## 2018-11-22 MED ORDER — OXYCODONE HCL 5 MG PO TABS
5.0000 mg | ORAL_TABLET | ORAL | Status: DC | PRN
  Administered 2018-11-22 (×2): 10 mg via ORAL
  Administered 2018-11-23: 5 mg via ORAL
  Administered 2018-11-23: 10 mg via ORAL
  Administered 2018-11-23 – 2018-11-24 (×2): 5 mg via ORAL
  Filled 2018-11-22: qty 1
  Filled 2018-11-22: qty 2
  Filled 2018-11-22: qty 1
  Filled 2018-11-22: qty 2
  Filled 2018-11-22: qty 1
  Filled 2018-11-22: qty 2

## 2018-11-22 MED ORDER — SODIUM CHLORIDE 0.9 % IV SOLN
INTRAVENOUS | Status: DC
  Administered 2018-11-22: 01:00:00 via INTRAVENOUS

## 2018-11-22 MED ORDER — DEXTROSE 5 % IV SOLN
2.0000 g | Freq: Four times a day (QID) | INTRAVENOUS | Status: AC
  Administered 2018-11-22 (×3): 2 g via INTRAVENOUS
  Filled 2018-11-22 (×3): qty 2000

## 2018-11-22 MED ORDER — ACETAMINOPHEN 500 MG PO TABS
1000.0000 mg | ORAL_TABLET | Freq: Four times a day (QID) | ORAL | Status: AC
  Administered 2018-11-22 (×3): 1000 mg via ORAL
  Filled 2018-11-22 (×4): qty 2

## 2018-11-22 MED ORDER — DIPHENHYDRAMINE HCL 12.5 MG/5ML PO ELIX: 12.5000 mg | ORAL_SOLUTION | ORAL | Status: DC | PRN

## 2018-11-22 MED ORDER — TRAMADOL HCL 50 MG PO TABS
50.0000 mg | ORAL_TABLET | Freq: Four times a day (QID) | ORAL | Status: DC
  Administered 2018-11-22 – 2018-11-24 (×8): 50 mg via ORAL
  Filled 2018-11-22 (×9): qty 1

## 2018-11-22 MED ORDER — ACETAMINOPHEN 325 MG PO TABS
325.0000 mg | ORAL_TABLET | Freq: Four times a day (QID) | ORAL | Status: DC | PRN
  Administered 2018-11-24: 650 mg via ORAL
  Filled 2018-11-22: qty 2

## 2018-11-22 MED ORDER — DOCUSATE SODIUM 100 MG PO CAPS
100.0000 mg | ORAL_CAPSULE | Freq: Two times a day (BID) | ORAL | Status: DC
  Administered 2018-11-22 – 2018-11-24 (×6): 100 mg via ORAL
  Filled 2018-11-22 (×6): qty 1

## 2018-11-22 MED ORDER — ONDANSETRON HCL 4 MG PO TABS: 4.0000 mg | ORAL_TABLET | Freq: Four times a day (QID) | ORAL | Status: DC | PRN

## 2018-11-22 MED ORDER — TRAMADOL HCL 50 MG PO TABS: 50.0000 mg | ORAL_TABLET | Freq: Four times a day (QID) | ORAL | 1 refills | Status: DC

## 2018-11-22 MED ORDER — ONDANSETRON HCL 4 MG/2ML IJ SOLN: 4.0000 mg | Freq: Four times a day (QID) | INTRAMUSCULAR | Status: DC | PRN

## 2018-11-22 NOTE — Clinical Social Work Note (Signed)
CSW received consult for possible placement. PT is pending; CSW will follow should PT recommend SNF.  Santiago Bumpers, MSW, Latanya Presser [phone]

## 2018-11-22 NOTE — Progress Notes (Signed)
Subjective: 1 Day Post-Op Procedure(s) (LRB): OPEN REDUCTION INTERNAL FIXATION (ORIF) ANKLE FRACTURE (Right) Patient reports pain as mild.   Patient seen in rounds with Dr. Roland Rack. Patient is well, and has had no acute complaints or problems Plan is to go Home versus rehab after hospital stay. Negative for chest pain and shortness of breath Fever: no Gastrointestinal: Negative for nausea and vomiting  Objective: Vital signs in last 24 hours: Temp:  [97.6 F (36.4 C)-98.9 F (37.2 C)] 98.5 F (36.9 C) (02/22 0019) Pulse Rate:  [52-94] 60 (02/22 0443) Resp:  [13-20] 18 (02/22 0443) BP: (116-224)/(52-93) 135/52 (02/22 0443) SpO2:  [93 %-100 %] 100 % (02/22 0443) Weight:  [71.7 kg] 71.7 kg (02/21 1514)  Intake/Output from previous day:  Intake/Output Summary (Last 24 hours) at 11/22/2018 0705 Last data filed at 11/22/2018 0000 Gross per 24 hour  Intake 460 ml  Output 5 ml  Net 455 ml    Intake/Output this shift: No intake/output data recorded.  Labs: Recent Labs    11/21/18 1708  HGB 12.6   Recent Labs    11/21/18 1708  WBC 12.9*  RBC 4.74  HCT 40.6  PLT 389   Recent Labs    11/21/18 1708 11/22/18 0603  NA 137 139  K 4.1 3.8  CL 100 103  CO2 26 27  BUN 11 15  CREATININE 0.65 0.87  GLUCOSE 139* 97  CALCIUM 9.4 8.9   Recent Labs    11/21/18 1708  INR 1.04     EXAM General - Patient is Alert and Oriented Extremity - Sensation intact distally Dorsiflexion/Plantar flexion intact Dressing/Incision - clean, dry, no drainage Motor Function - intact, moving foot and toes well on exam.   Past Medical History:  Diagnosis Date  . Anxiety   . Arthritis   . Cervical central spinal stenosis (C3-C7) (worse at C4-5) 07/30/2017  . Cervical foraminal stenosis (C4-5 and C5-6) (Bilateral) (L>R) 07/30/2017  . Chronic lower extremity pain (Fourth Area of Pain) (Bilateral) (R>L) 06/11/2017  . Chronic neck pain (Primary Area of Pain) (Bilateral) (R>L) 06/11/2017  .  Chronic sacroiliac joint pain (Bilateral) (R>L) 06/11/2017  . Chronic shoulder pain [facility] Area of Pain) (Right) 06/11/2017  . Chronic upper extremity pain (Fifth Area of Pain) (Bilateral) (R>L) 06/11/2017  . DDD (degenerative disc disease), cervical 07/30/2017  . DDD (degenerative disc disease), lumbar 07/30/2017  . Depression   . DISH (diffuse idiopathic skeletal hyperostosis) 07/30/2017  . Dyspnea    with exertion  . Entrapment syndrome 06/20/2017   2002 on R and 2010 on L  . Full thickness rotator cuff tear 06/12/2017  . GERD (gastroesophageal reflux disease)   . Grade 1 Anterolisthesis of L4 over L5 07/30/2017  . Headache   . Hx: UTI (urinary tract infection)   . Hypertension   . Inflammation of joint of shoulder region 01/15/2017  . Lumbar central spinal stenosis (L4-5) 07/30/2017  . Lumbar disc protrusion (Left: L5-S1) (Right: L1-2) 07/30/2017   L5-S1 left foraminal protrusion with L5 impingement. L1-2 right paracentral protrusion without impingement.  . Lumbar facet arthropathy (Bilateral) 07/30/2017  . Lumbar facet syndrome (Bilateral) (R>L) 07/30/2017  . Osteoarthritis of shoulder (Right) 01/15/2017    Assessment/Plan: 1 Day Post-Op Procedure(s) (LRB): OPEN REDUCTION INTERNAL FIXATION (ORIF) ANKLE FRACTURE (Right) Active Problems:   Ankle fracture, right  Estimated body mass index is 26.29 kg/m as calculated from the following:   Height as of this encounter: 5\' 5"  (1.651 m).   Weight as of this  encounter: 71.7 kg. Advance diet Up with therapy D/C IV fluids Plan for discharge tomorrow  DVT Prophylaxis - Lovenox None weight-Bearing to right leg  Reche Dixon, PA-C Orthopaedic Surgery 11/22/2018, 7:05 AM

## 2018-11-22 NOTE — Anesthesia Postprocedure Evaluation (Signed)
Anesthesia Post Note  Patient: Gloria Allen  Procedure(s) Performed: OPEN REDUCTION INTERNAL FIXATION (ORIF) ANKLE FRACTURE (Right )  Patient location during evaluation: PACU Anesthesia Type: Spinal Level of consciousness: oriented and awake and alert Pain management: pain level controlled Vital Signs Assessment: post-procedure vital signs reviewed and stable Respiratory status: spontaneous breathing, respiratory function stable and patient connected to nasal cannula oxygen Cardiovascular status: blood pressure returned to baseline and stable Postop Assessment: no headache, no backache and no apparent nausea or vomiting Anesthetic complications: no     Last Vitals:  Vitals:   11/22/18 0019 11/22/18 0136  BP: (!) 141/71 (!) 152/56  Pulse: (!) 55 (!) 57  Resp: 16 18  Temp: 36.9 C   SpO2: 100% 100%    Last Pain:  Vitals:   11/22/18 0132  TempSrc:   PainSc: [place]

## 2018-11-22 NOTE — Plan of Care (Signed)
  Problem: Activity: Goal: Ability to tolerate increased activity will improve Outcome: Progressing   Problem: Education: Goal: Verbalization of understanding the information provided will improve Outcome: Progressing   Problem: Coping: Goal: Level of anxiety will decrease Outcome: Progressing   Problem: Physical Regulation: Goal: Postoperative complications will be avoided or minimized Outcome: Progressing   Problem: Respiratory: Goal: Ability to maintain a clear airway will improve Outcome: Progressing   Problem: Pain Management: Goal: Pain level will decrease Outcome: Progressing   Problem: Skin Integrity: Goal: Signs of wound healing will improve Outcome: Progressing   Problem: Tissue Perfusion: Goal: Ability to maintain adequate tissue perfusion will improve Outcome: Progressing

## 2018-11-22 NOTE — Discharge Instructions (Signed)
INSTRUCTIONS AFTER Surgery  o Remove items at home which could result in a fall. This includes throw rugs or furniture in walking pathways o ICE to the affected joint every three hours while awake for 30 minutes at a time, for at least the first 3-5 days, and then as needed for pain and swelling.  Continue to use ice for pain and swelling. You may notice swelling that will progress down to the foot and ankle.  This is normal after surgery.  Elevate your leg when you are not up walking on it.   o Continue to use the breathing machine you got in the hospital (incentive spirometer) which will help keep your temperature down.  It is common for your temperature to cycle up and down following surgery, especially at night when you are not up moving around and exerting yourself.  The breathing machine keeps your lungs expanded and your temperature down.   DIET:  As you were doing prior to hospitalization, we recommend a well-balanced diet.  DRESSING / WOUND CARE / SHOWERING  Splint and Ace wrap are to remain on the right ankle.  No showering.  Plan on following up in 1 week for wound care.  ACTIVITY  o Increase activity slowly as tolerated, but follow the weight bearing instructions below.   o No driving for 6 weeks or until further direction given by your physician.  You cannot drive while taking narcotics.  o No lifting or carrying greater than 10 lbs. until further directed by your surgeon. o Avoid periods of inactivity such as sitting longer than an hour when not asleep. This helps prevent blood clots.  o You may return to work once you are authorized by your doctor.     WEIGHT BEARING  Nonweightbearing on the right with a walker.   EXERCISES No ankle exercises.  CONSTIPATION  Constipation is defined medically as fewer than three stools per week and severe constipation as less than one stool per week.  Even if you have a regular bowel pattern at home, your normal regimen is likely to be  disrupted due to multiple reasons following surgery.  Combination of anesthesia, postoperative narcotics, change in appetite and fluid intake all can affect your bowels.   YOU MUST use at least one of the following options; they are listed in order of increasing strength to get the job done.  They are all available over the counter, and you may need to use some, POSSIBLY even all of these options:    Drink plenty of fluids (prune juice may be helpful) and high fiber foods Colace 100 mg by mouth twice a day  Senokot for constipation as directed and as needed Dulcolax (bisacodyl), take with full glass of water  Miralax (polyethylene glycol) once or twice a day as needed.  If you have tried all these things and are unable to have a bowel movement in the first 3-4 days after surgery call either your surgeon or your primary doctor.    If you experience loose stools or diarrhea, hold the medications until you stool forms back up.  If your symptoms do not get better within 1 week or if they get worse, check with your doctor.  If you experience "the worst abdominal pain ever" or develop nausea or vomiting, please contact the office immediately for further recommendations for treatment.   ITCHING:  If you experience itching with your medications, try taking only a single pain pill, or even half a pain pill at  a time.  You can also use Benadryl over the counter for itching or also to help with sleep.   TED HOSE STOCKINGS:  Use stockings on both legs until for at least 2 weeks or as directed by physician office. They may be removed at night for sleeping.  MEDICATIONS:  See your medication summary on the After Visit Summary that nursing will review with you.  You may have some home medications which will be placed on hold until you complete the course of blood thinner medication.  It is important for you to complete the blood thinner medication as prescribed.  PRECAUTIONS:  If you experience chest pain or  shortness of breath - call 911 immediately for transfer to the hospital emergency department.   If you develop a fever greater that 101 F, purulent drainage from wound, increased redness or drainage from wound, foul odor from the wound/dressing, or calf pain - CONTACT YOUR SURGEON.                                                   FOLLOW-UP APPOINTMENTS:  If you do not already have a post-op appointment, please call the office for an appointment to be seen by your surgeon.  Guidelines for how soon to be seen are listed in your After Visit Summary, but are typically between 1-4 weeks after surgery.  OTHER INSTRUCTIONS:     MAKE SURE YOU:   Understand these instructions.   Get help right away if you are not doing well or get worse.    Thank you for letting us be a part of your medical care team.  It is a privilege we respect greatly.  We hope these instructions will help you stay on track for a fast and full recovery!

## 2018-11-22 NOTE — Evaluation (Signed)
Physical Therapy Evaluation Patient Details Name: Gloria Allen MRN: [id number] DOB: 1946/07/31 Today's Date: 11/22/2018   History of Present Illness  Pt is a 73 year old female s/p R ankle ORIF (11/21/2018) following a fall on ice.  Pt was walking to her mailbox when she slipped.  Imaging revealed a distal fibular fracture with posterior dislocation of tib-talar joint.  PMH includes chronic shoulder pain, lumbar disc protrusions and chronic neck pain.  Clinical Impression  Pt is a 73 year old female who lives alone in a one story home alone.  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  Pt pleasant and alert and oriented upon PT arrival. Reporting 8/10 pain in ankle.  She was able to perform bed mobility with min A and demonstrate good strength of L LE.  Pt has hx of Cx and shoulder pain which affected her ability to control RW.  She was able to perform STS with min A but became fatigued quickly during transfer to chair, requiring mod A for stability.  Pt open to all education.  She will continue to benefit from skilled PT with focus on strength, tolerance to activity, pain management and safe functional mobility.  Due to decrease of mobility compared to pt's baseline and lack of caregiver support at home, pt will benefit from SNF placement following discharge.    Follow Up Recommendations SNF    Equipment Recommendations  Rolling walker with 5" wheels    Recommendations for Other Services       Precautions / Restrictions Precautions Precautions: Fall Required Braces or Orthoses: Splint/Cast Splint/Cast: R LL casted following surgery 11/21/2018 Restrictions Weight Bearing Restrictions: Yes RLE Weight Bearing: Non weight bearing      Mobility  Bed Mobility Overal bed mobility: Needs Assistance Bed Mobility: Supine to Sit     Supine to sit: Min assist     General bed mobility comments: Min A to bring R LE over EOB and VC's for NWB status of R LE.  Transfers Overall  transfer level: Needs assistance Equipment used: Rolling walker (2 wheeled) Transfers: Sit to/from [facility] Sit to Stand: Min assist Stand pivot transfers: Mod assist       General transfer comment: Education for use of RW and assistance to steady pt.  Pt able to perform standing pivot transfer but required mod A to correct LOB when attempting to turn to back to chair.  Pt's UE fatigued quickly and she was visibly shaking.  Pt also reported some "lightheadedness".  Able to maintain NWB status but with help from PT to manage RW.  Ambulation/Gait                Stairs            Wheelchair Mobility    Modified Rankin (Stroke Patients Only)       Balance Overall balance assessment: Needs assistance Sitting-balance support: Feet unsupported Sitting balance-Leahy Scale: Normal     Standing balance support: Bilateral upper extremity supported Standing balance-Leahy Scale: Poor Standing balance comment: Pt reliant on RW for balance due to NWB status and weakness.                             Pertinent Vitals/Pain Pain Assessment: 0-10 Pain Score: 8  Pain Location: R ankle Pain Descriptors / Indicators: Aching Pain Intervention(s): Limited activity within patient's tolerance;Monitored during session;Patient requesting pain meds-RN notified    Home Living Family/patient  expects to be discharged to:: Private residence Living Arrangements: Alone Available Help at Discharge: Neighbor;Available PRN/intermittently Type of Home: House Home Access: Stairs to enter Entrance Stairs-Rails: Can reach both Entrance Stairs-Number of Steps: 4 Home Layout: One level Home Equipment: Cane - single point      Prior Function Level of Independence: Independent with assistive device(s)         Comments: Pt has a SPC which she uses but has been a Hydrographic surveyor and generally active.     Hand Dominance        Extremity/Trunk Assessment    Upper Extremity Assessment Upper Extremity Assessment: Overall WFL for tasks assessed    Lower Extremity Assessment Lower Extremity Assessment: Overall WFL for tasks assessed;RLE deficits/detail(L LE grossly 4/5 ) RLE Deficits / Details: Able to perform SLR for bed mobility and toes are fully functional RLE: Unable to fully assess due to immobilization RLE Sensation: WNL    Cervical / Trunk Assessment Cervical / Trunk Assessment: Normal  Communication   Communication: No difficulties  Cognition Arousal/Alertness: Awake/alert Behavior During Therapy: WFL for tasks assessed/performed Overall Cognitive Status: Within Functional Limits for tasks assessed                                 General Comments: Very pleasant and ready to work with therapy.      General Comments      Exercises Other Exercises Other Exercises: Education regarding use of RW, management of WB status and benefit of frequent movement and sitting upright. x10 min   Assessment/Plan    PT Assessment Patient needs continued PT services  PT Problem List Decreased strength;Decreased mobility;Decreased balance;Decreased knowledge of use of DME;Decreased activity tolerance;Pain       PT Treatment Interventions DME instruction;Therapeutic activities;Gait training;Therapeutic exercise;Patient/family education;Stair training;Balance training;Functional mobility training    PT Goals (Current goals can be found in the Care Plan section)  Acute Rehab PT Goals Patient Stated Goal: To get stronger so that she can go home and be independent again. PT Goal Formulation: With patient Time For Goal Achievement: 12/06/18 Potential to Achieve Goals: Good    Frequency BID   Barriers to discharge        Co-evaluation               AM-PAC PT "6 Clicks" Mobility  Outcome Measure Help needed turning from your back to your side while in a flat bed without using bedrails?: None Help needed moving from  lying on your back to sitting on the side of a flat bed without using bedrails?: A Little Help needed moving to and from a bed to a chair (including a wheelchair)?: A Lot Help needed standing up from a chair using your arms (e.g., wheelchair or bedside chair)?: A Lot Help needed to walk in hospital room?: A Lot Help needed climbing 3-5 steps with a railing? : A Lot 6 Click Score: 15    End of Session Equipment Utilized During Treatment: Gait belt Activity Tolerance: Patient limited by fatigue Patient left: in chair;with call bell/phone within reach Nurse Communication: Mobility status PT Visit Diagnosis: Unsteadiness on feet (R26.81);Muscle weakness (generalized) (M62.81);History of falling (Z91.81);Pain Pain - Right/Left: Right Pain - part of body: Ankle and joints of foot    Time: 1005-1033 PT Time Calculation (min) (ACUTE ONLY): 28 min   Charges:   PT Evaluation $PT Eval Low Complexity: 1 Low PT Treatments $Therapeutic Activity: 8-22  mins       Roxanne Gates, PT, DPT   Roxanne Gates 11/22/2018, 10:51 AM

## 2018-11-22 NOTE — Progress Notes (Signed)
Physical Therapy Treatment Patient Details Name: Gloria Allen MRN: [id number] DOB: 1945-11-30 Today's Date: 11/22/2018    History of Present Illness Pt is a 73 year old female s/p R ankle ORIF (11/21/2018) following a fall on ice.  Pt was walking to her mailbox when she slipped.  Imaging revealed a distal fibular fracture with posterior dislocation of tib-talar joint.  PMH includes chronic shoulder pain, lumbar disc protrusions and chronic neck pain.    PT Comments    Pt appearing more confused this pm and RN consulted.  She stated that she feels this is related to pain medication.  Pt reports R LL pain of 6/10.  Pt able to perform STS with min A and transfer to bed with mod A, demonstrating more difficulty in maintaining WB status this time.  Pt able to return to bed with min A and scoot up in bed with L LE unilateral bridge.  PT able to complete all there ex with manual assist for hip abduction and SLR.  PT assisted pt in changing of gown and bedding and pt was able to perform all bed mobility necessary without physical assist.  Pt educated on maintaining WB status.  Pt was open to all education.  Pt will continue to benefit from skilled PT with focus on strength, safe functional mobility, pain management and HEP.  SNF setting is still appropriate for discharge.  Follow Up Recommendations  SNF     Equipment Recommendations  Rolling walker with 5" wheels    Recommendations for Other Services       Precautions / Restrictions Precautions Precautions: Fall Required Braces or Orthoses: Splint/Cast Splint/Cast: R LL casted following surgery 11/21/2018 Restrictions Weight Bearing Restrictions: Yes RLE Weight Bearing: Non weight bearing    Mobility  Bed Mobility Overal bed mobility: Needs Assistance Bed Mobility: Sit to Supine       Sit to supine: Min assist   General bed mobility comments: Min A to bring R LE over EOB and VC's for NWB status of R LE.  Pt able to scoot up in bed with  unilateral bridge.  Transfers Overall transfer level: Needs assistance Equipment used: Rolling walker (2 wheeled) Transfers: Sit to/from [facility] Sit to Stand: Min assist Stand pivot transfers: Mod assist       General transfer comment: Pt with difficulty managing WB status this time.  Required VC's for slower movement and placement prior to sitting down.  Ambulation/Gait                 Stairs             Wheelchair Mobility    Modified Rankin (Stroke Patients Only)       Balance Overall balance assessment: Needs assistance Sitting-balance support: Feet unsupported Sitting balance-Leahy Scale: Normal     Standing balance support: Bilateral upper extremity supported Standing balance-Leahy Scale: Poor Standing balance comment: Pt reliant on RW for balance due to NWB status and weakness.                            Cognition Arousal/Alertness: Awake/alert;Suspect due to medications Behavior During Therapy: [facility] for tasks assessed/performed Overall Cognitive Status: Within Functional Limits for tasks assessed                                 General Comments: Pt appeared to be confused this afternoon,  stating that thoughts kept "leaving" her as she was trying to speak.  Disoriented to time and date.      Exercises General Exercises - Lower Extremity Quad Sets: Right;10 reps;Supine;Strengthening Gluteal Sets: Strengthening;10 reps;Both;Supine Short Arc Quad: Strengthening;Right;10 reps;Supine Heel Slides: Left;Strengthening;10 reps;Supine Hip ABduction/ADduction: AAROM;Right;10 reps;Supine Straight Leg Raises: AAROM;10 reps;Supine Other Exercises Other Exercises: Assistance with changing of gown, bedding and chair setup as pt had become incontinent.  x10 min    General Comments        Pertinent Vitals/Pain Pain Assessment: 0-10 Pain Score: 6  Pain Location: R LL Pain Descriptors / Indicators:  Aching Pain Intervention(s): Monitored during session    Home Living                      Prior Function            PT Goals (current goals can now be found in the care plan section) Acute Rehab PT Goals Patient Stated Goal: To get stronger so that she can go home and be independent again. PT Goal Formulation: With patient Time For Goal Achievement: 12/06/18 Potential to Achieve Goals: Good    Frequency    BID      PT Plan Current plan remains appropriate    Co-evaluation              AM-PAC PT "6 Clicks" Mobility   Outcome Measure  Help needed turning from your back to your side while in a flat bed without using bedrails?: None Help needed moving from lying on your back to sitting on the side of a flat bed without using bedrails?: A Little Help needed moving to and from a bed to a chair (including a wheelchair)?: A Lot Help needed standing up from a chair using your arms (e.g., wheelchair or bedside chair)?: A Lot Help needed to walk in hospital room?: A Lot Help needed climbing 3-5 steps with a railing? : A Lot 6 Click Score: 15    End of Session Equipment Utilized During Treatment: Gait belt Activity Tolerance: Patient limited by fatigue Patient left: with bed alarm set;with call bell/phone within reach;in bed Nurse Communication: Mobility status PT Visit Diagnosis: Unsteadiness on feet (R26.81);Muscle weakness (generalized) (M62.81);History of falling (Z91.81);Pain Pain - Right/Left: Right Pain - part of body: Ankle and joints of foot     Time: 1341-1415 PT Time Calculation (min) (ACUTE ONLY): 34 min  Charges:  $Therapeutic Exercise: 8-22 mins $Therapeutic Activity: 8-22 mins                     Roxanne Gates, PT, DPT    Roxanne Gates 11/22/2018, 2:54 PM

## 2018-11-23 LAB — BASIC METABOLIC PANEL
Anion gap: 8 (ref 5–15)
BUN: 12 mg/dL (ref 8–23)
CO2: 25 mmol/L (ref 22–32)
Calcium: 8.4 mg/dL — ABNORMAL LOW (ref 8.9–10.3)
Chloride: 102 mmol/L (ref 98–111)
Creatinine, Ser: 0.62 mg/dL (ref 0.44–1.00)
GFR calc non Af Amer: >60 mL/min (ref >60)
Glucose, Bld: 116 mg/dL — ABNORMAL HIGH (ref 70–99)
POTASSIUM: 3.8 mmol/L (ref 3.5–5.1)
Sodium: 135 mmol/L (ref 135–145)

## 2018-11-23 NOTE — NC FL2 (Signed)
[place] LEVEL OF CARE SCREENING TOOL     IDENTIFICATION  Patient Name: Gloria Allen Birthdate: 25-Feb-1946 Sex: female Admission Date (Current Location): 11/21/2018  [place] and [place] Number:  Engineering geologist and Address:  [facility], [address], [place], [place] [address]      Provider Number: [id number]  Attending Physician Name and Address:  Corky Mull, MD  Relative Name and Phone Number:  Vihana Kydd (Granddaughter) [phone]    Current Level of Care: Hospital Recommended Level of Care: [place] Prior Approval Number:    Date Approved/Denied:   PASRR Number: [id number]  Discharge Plan: SNF    Current Diagnoses: Patient Active Problem List   Diagnosis Date Noted  . Ankylosing spondylitis of multiple sites in spine ([place]) 11/21/2018  . Severe episode of recurrent major depressive disorder, without psychotic features ([place]) 11/21/2018  . Ankle fracture, right 11/21/2018  . Benign neoplasm of ascending colon   . Malignant neoplasm of sigmoid colon ([place])   . Melanosis, colon   . Special screening for malignant neoplasms, colon   . Maculopapular rash   . Colon neoplasm   . Melena   . Columnar epithelial-lined lower esophagus   . Stomach irritation   . IFG (impaired fasting glucose) 04/22/2018  . Ataxia 03/04/2018  . Atypical chest pain 03/04/2018  . SOB (shortness of breath) 01/26/2018  . Laryngopharyngeal reflux (LPR) 08/16/2017  . Suspected exposure to mold 08/05/2017  . Poor historian 08/05/2017  . Lumbar foraminal stenosis (Bilateral: L4-5) (Left: L5-S1) 07/30/2017  . Osteopenia of spine 07/30/2017  . PTSD (post-traumatic stress disorder) 06/20/2017  . Cervical arthritis 06/20/2017  . Bronchial asthma 06/20/2017  . Long term current use of opiate analgesic 06/11/2017  . Chronic pain syndrome 06/11/2017  . Anxiety   . Depression   . GERD (gastroesophageal reflux disease)   .  Benign hypertensive renal disease   . History of rotator cuff repair (Right) 03/26/2017    Orientation RESPIRATION BLADDER Height & Weight     Self, Time, Situation, Place  Normal Continent Weight: 158 lb (71.7 kg) Height:  5\' 5"  (165.1 cm)  BEHAVIORAL SYMPTOMS/MOOD NEUROLOGICAL BOWEL NUTRITION STATUS      Continent Diet(Heart healthy/Carb modified)  AMBULATORY STATUS COMMUNICATION OF NEEDS Skin   Extensive Assist Verbally Surgical wounds                       Personal Care Assistance Level of Assistance  Bathing, Feeding, Dressing Bathing Assistance: Limited assistance Feeding assistance: Independent Dressing Assistance: Limited assistance     Functional Limitations Info  Sight, Hearing, Speech Sight Info: Adequate Hearing Info: Adequate Speech Info: Adequate    SPECIAL CARE FACTORS FREQUENCY  PT (By licensed PT), OT (By licensed OT)     PT Frequency: 5X OT Frequency: 3X            Contractures Contractures Info: Not present    Additional Factors Info  Code Status, Allergies Code Status Info: Full Allergies Info:  Ace Inhibitors, Angiotensin Receptor Blockers, Sulfa Antibiotics, Chlorthalidone, Flexeril Cyclobenzaprine           Current Medications (11/23/2018):  This is the current hospital active medication list Current Facility-Administered Medications  Medication Dose Route Frequency Provider Last Rate Last Dose  . acetaminophen (TYLENOL) tablet 325-650 mg  325-650 mg Oral Q6H PRN Poggi, Marshall Cork, MD      . albuterol (PROVENTIL) (2.5 MG/3ML) 0.083% nebulizer solution 2.5 mg  2.5 mg Inhalation Q6H PRN Poggi, Marshall Cork, MD      . bisacodyl (DULCOLAX) suppository 10 mg  10 mg Rectal Daily PRN Poggi, Marshall Cork, MD      . clonazePAM Bobbye Charleston) tablet 0.5 mg  0.5 mg Oral TID Corky Mull, MD   0.5 mg at 11/23/18 0854  . cloNIDine (CATAPRES) tablet 0.3 mg  0.3 mg Oral Daily Poggi, Marshall Cork, MD   0.3 mg at 11/23/18 0854  . diphenhydrAMINE (BENADRYL) 12.5 MG/5ML  elixir 12.5-25 mg  12.5-25 mg Oral Q4H PRN Poggi, Marshall Cork, MD      . docusate sodium (COLACE) capsule 100 mg  100 mg Oral BID Corky Mull, MD   100 mg at 11/23/18 1610  . enoxaparin (LOVENOX) injection 40 mg  40 mg Subcutaneous Q24H Poggi, Marshall Cork, MD   40 mg at 11/23/18 1137  . fluticasone furoate-vilanterol (BREO ELLIPTA) 200-25 MCG/INH 1 puff  1 puff Inhalation Daily PRN Poggi, Marshall Cork, MD      . hydrALAZINE (APRESOLINE) tablet 100 mg  100 mg Oral TID Corky Mull, MD   100 mg at 11/23/18 0853  . magnesium hydroxide (MILK OF MAGNESIA) suspension 30 mL  30 mL Oral Daily PRN Poggi, Marshall Cork, MD      . meclizine (ANTIVERT) tablet 12.5 mg  12.5 mg Oral TID PRN Poggi, Marshall Cork, MD      . metoCLOPramide (REGLAN) tablet 5-10 mg  5-10 mg Oral Q8H PRN Poggi, Marshall Cork, MD       Or  . metoCLOPramide (REGLAN) injection 5-10 mg  5-10 mg Intravenous Q8H PRN Poggi, Marshall Cork, MD      . metoprolol succinate (TOPROL-XL) 24 hr tablet 50 mg  50 mg Oral Daily Poggi, Marshall Cork, MD   50 mg at 11/23/18 0853  . mirtazapine (REMERON) tablet 15 mg  15 mg Oral QHS Poggi, Marshall Cork, MD   15 mg at 11/22/18 2110  . montelukast (SINGULAIR) tablet 10 mg  10 mg Oral QHS PRN Poggi, Marshall Cork, MD      . ondansetron (ZOFRAN) tablet 4 mg  4 mg Oral Q6H PRN Poggi, Marshall Cork, MD       Or  . ondansetron (ZOFRAN) injection 4 mg  4 mg Intravenous Q6H PRN Poggi, Marshall Cork, MD      . oxybutynin (DITROPAN) tablet 5 mg  5 mg Oral BID Poggi, Marshall Cork, MD   5 mg at 11/23/18 0853  . oxyCODONE (Oxy IR/ROXICODONE) immediate release tablet 5-10 mg  5-10 mg Oral Q4H PRN Poggi, Marshall Cork, MD   5 mg at 11/23/18 0853  . pantoprazole (PROTONIX) EC tablet 80 mg  80 mg Oral Daily Poggi, Marshall Cork, MD   80 mg at 11/23/18 0853  . pravastatin (PRAVACHOL) tablet 40 mg  40 mg Oral q1800 Poggi, Marshall Cork, MD   40 mg at 11/22/18 1641  . sertraline (ZOLOFT) tablet 200 mg  200 mg Oral QHS Poggi, Marshall Cork, MD   200 mg at 11/22/18 2109  . sodium phosphate (FLEET) 7-19 GM/118ML enema 1 enema  1  enema Rectal Once PRN Poggi, Marshall Cork, MD      . traMADol Veatrice Bourbon) tablet 50 mg  50 mg Oral Q6H Poggi, Marshall Cork, MD   50 mg at 11/23/18 1137  . traZODone (DESYREL) tablet 50 mg  50 mg Oral QHS Poggi, Marshall Cork, MD   50 mg at 11/22/18 2110  . umeclidinium-vilanterol (ANORO ELLIPTA) 62.5-25  MCG/INH 1 puff  1 puff Inhalation Daily Poggi, Marshall Cork, MD   1 puff at 11/23/18 7482     Discharge Medications: Please see discharge summary for a list of discharge medications.  Relevant Imaging Results:  Relevant Lab Results:   Additional Information [id number]  Zettie Pho, LCSW

## 2018-11-23 NOTE — Clinical Social Work Note (Signed)
Clinical Social Work Assessment  Patient Details  Name: Gloria Allen MRN: [id number] Date of Birth: 06/21/1946  Date of referral:  11/23/18               Reason for consult:  Facility Placement                Permission sought to share information with:  Chartered certified accountant granted to share information::  Yes, Verbal Permission Granted  Name::        Agency::     Relationship::     Contact Information:     Housing/Transportation Living arrangements for the past 2 months:  Single Family Home Source of Information:  Patient, Medical Team, Adult Children Patient Interpreter Needed:  None Criminal Activity/Legal Involvement Pertinent to Current Situation/Hospitalization:  No - Comment as needed Significant Relationships:  Adult Children, [place], Other Family Members, Siblings, Neighbor Lives with:  Self Do you feel safe going back to the place where you live?  Yes Need for family participation in patient care:  No (Coment)  Care giving concerns:  PT recommendation for SNF.   Social Worker assessment / plan: The CSW met with the patient, her daughter, SIL, and brother at bedside to discuss discharge planning. The CSW provided a copy of the Medicare.gov list of available facilities and explained the referral process, insurance prior authorization process, and what to expect from SNF. The patient verbalized permission to begin referral once she was assured that no one would "force" her to go to a SNF. The patient's family shared that they are willing to support SNF placement if only for short term. The family is able and willing to provide 24 hour supervision/care should the patient decline SNF. The patient and her family stated that the preferences are [facility], [facility], or [facility].   The CSW has sent the referral and will follow up with bed offers. The patient will need prior authorization from [facility] prior to admitting to SNF. CSW is  following.  Employment status:  Retired Nurse, adult PT Recommendations:  [place] / Referral to community resources:  [place]  Patient/Family's Response to care:  The patient and her family were pleasant and involved in the care process.  Patient/Family's Understanding of and Emotional Response to Diagnosis, Current Treatment, and Prognosis:  The patient and her family agree with SNF placement for short term care for safe discharge as the patient lives alone.  Emotional Assessment Appearance:  Appears stated age Attitude/Demeanor/Rapport:  Gracious, Charismatic Affect (typically observed):  Stable, Pleasant Orientation:  Oriented to Self, Oriented to Place, Oriented to  Time, Oriented to Situation Alcohol / Substance use:  Never Used Psych involvement (Current and /or in the community):  No (Comment)  Discharge Needs  Concerns to be addressed:  Discharge Planning Concerns, Care Coordination Readmission within the last 30 days:  No Current discharge risk:  Physical Impairment, Lives alone Barriers to Discharge:  Continued Medical Work up, [place], LCSW 11/23/2018, 4:21 PM

## 2018-11-23 NOTE — Progress Notes (Signed)
Family at bedside asking to speak to Dr Roland Rack. I updated family that Dr is in [place] with a case and asked if I could help any way. Family is concerned about patient going to rehab, and state that they want her to come home. I explained to family that PT is currently recommending rehab. Family asked about what facility is she going to, and I updated family that bed offers will first be made and the social worker would update them on which facilities would take her. Family is asking to speak to social worker, Santiago Glad called, no answer. Will try again in 10 minutes. Family updated.

## 2018-11-23 NOTE — Progress Notes (Signed)
Subjective: 2 Days Post-Op Procedure(s) (LRB): OPEN REDUCTION INTERNAL FIXATION (ORIF) ANKLE FRACTURE (Right) Patient reports pain as mild.   Patient seen in rounds with Dr. Roland Rack. Patient is well, and has had no acute complaints or problems Plan is to go to rehab after hospital stay. Negative for chest pain and shortness of breath Fever: no Gastrointestinal: Negative for nausea and vomiting  Objective: Vital signs in last 24 hours: Temp:  [97.3 F (36.3 C)-98.7 F (37.1 C)] 98.7 F (37.1 C) (02/22 2312) Pulse Rate:  [55-72] 69 (02/22 2312) Resp:  [16-18] 18 (02/22 2312) BP: (112-174)/(56-71) 174/62 (02/22 2312) SpO2:  [95 %-100 %] 99 % (02/22 2312)  Intake/Output from previous day:  Intake/Output Summary (Last 24 hours) at 11/23/2018 0705 Last data filed at 11/22/2018 1735 Gross per 24 hour  Intake 1924.9 ml  Output -  Net 1924.9 ml    Intake/Output this shift: No intake/output data recorded.  Labs: Recent Labs    11/21/18 1708  HGB 12.6   Recent Labs    11/21/18 1708  WBC 12.9*  RBC 4.74  HCT 40.6  PLT 389   Recent Labs    11/22/18 0603 11/23/18 0545  NA 139 135  K 3.8 3.8  CL 103 102  CO2 27 25  BUN 15 12  CREATININE 0.87 0.62  GLUCOSE 97 116*  CALCIUM 8.9 8.4*   Recent Labs    11/21/18 1708  INR 1.04     EXAM General - Patient is Alert and Oriented Extremity - Sensation intact distally Dorsiflexion/Plantar flexion intact Dressing/Incision - clean, dry, no drainage Motor Function - intact, moving foot and toes well on exam.   Past Medical History:  Diagnosis Date  . Anxiety   . Arthritis   . Cervical central spinal stenosis (C3-C7) (worse at C4-5) 07/30/2017  . Cervical foraminal stenosis (C4-5 and C5-6) (Bilateral) (L>R) 07/30/2017  . Chronic lower extremity pain (Fourth Area of Pain) (Bilateral) (R>L) 06/11/2017  . Chronic neck pain (Primary Area of Pain) (Bilateral) (R>L) 06/11/2017  . Chronic sacroiliac joint pain (Bilateral)  (R>L) 06/11/2017  . Chronic shoulder pain [facility] Area of Pain) (Right) 06/11/2017  . Chronic upper extremity pain (Fifth Area of Pain) (Bilateral) (R>L) 06/11/2017  . DDD (degenerative disc disease), cervical 07/30/2017  . DDD (degenerative disc disease), lumbar 07/30/2017  . Depression   . DISH (diffuse idiopathic skeletal hyperostosis) 07/30/2017  . Dyspnea    with exertion  . Entrapment syndrome 06/20/2017   2002 on R and 2010 on L  . Full thickness rotator cuff tear 06/12/2017  . GERD (gastroesophageal reflux disease)   . Grade 1 Anterolisthesis of L4 over L5 07/30/2017  . Headache   . Hx: UTI (urinary tract infection)   . Hypertension   . Inflammation of joint of shoulder region 01/15/2017  . Lumbar central spinal stenosis (L4-5) 07/30/2017  . Lumbar disc protrusion (Left: L5-S1) (Right: L1-2) 07/30/2017   L5-S1 left foraminal protrusion with L5 impingement. L1-2 right paracentral protrusion without impingement.  . Lumbar facet arthropathy (Bilateral) 07/30/2017  . Lumbar facet syndrome (Bilateral) (R>L) 07/30/2017  . Osteoarthritis of shoulder (Right) 01/15/2017    Assessment/Plan: 2 Days Post-Op Procedure(s) (LRB): OPEN REDUCTION INTERNAL FIXATION (ORIF) ANKLE FRACTURE (Right) Active Problems:   Ankle fracture, right  Estimated body mass index is 26.29 kg/m as calculated from the following:   Height as of this encounter: 5\' 5"  (1.651 m).   Weight as of this encounter: 71.7 kg. Advance diet. Continue physical therapy. Discharge plan  to rehab probable tomorrow. Follow-up with [facility] clinic in 1 week  DVT Prophylaxis - Lovenox None weight-Bearing to right leg  Reche Dixon, PA-C Orthopaedic Surgery 11/23/2018, 7:05 AM

## 2018-11-23 NOTE — Progress Notes (Signed)
Physical Therapy Treatment Patient Details Name: Gloria Allen MRN: [id number] DOB: [birth year] Today's Date: 11/23/2018    History of Present Illness Pt is a 73 year old female s/p R ankle ORIF (11/21/2018) following a fall on ice.  Pt was walking to her mailbox when she slipped.  Imaging revealed a distal fibular fracture with posterior dislocation of tib-talar joint.  PMH includes chronic shoulder pain, lumbar disc protrusions and chronic neck pain.    PT Comments    Pt was seen for transition to the chair and then walked with recliner close behind.  Pt is talking about her hope to go directly home, but encouraged her to go to SNF and let her daughter help with getting her bills paid.  Pt is not able to hop up one step, and will certainly be at a higher fall risk with this effort.  Follow on acutely for strengthening and balance with RLE NWB.   Follow Up Recommendations  SNF     Equipment Recommendations  Rolling walker with 5" wheels    Recommendations for Other Services       Precautions / Restrictions Precautions Precautions: Fall Required Braces or Orthoses: Splint/Cast Splint/Cast: R LL casted following surgery 11/21/2018 Restrictions Weight Bearing Restrictions: Yes RLE Weight Bearing: Non weight bearing    Mobility  Bed Mobility Overal bed mobility: Needs Assistance Bed Mobility: Supine to Sit     Supine to sit: Min assist     General bed mobility comments: cued hand placement to assist rolling and to sit  Transfers Overall transfer level: Needs assistance Equipment used: Rolling walker (2 wheeled) Transfers: Sit to/from Stand Sit to Stand: Min assist            Ambulation/Gait Ambulation/Gait assistance: Herbalist (Feet): 18 Feet Assistive device: Rolling walker (2 wheeled)   Gait velocity: reduced Gait velocity interpretation: <1.8 ft/sec, indicate of risk for recurrent falls General Gait Details: hopping on LLE    Stairs              Wheelchair Mobility    Modified Rankin (Stroke Patients Only)       Balance Overall balance assessment: Needs assistance Sitting-balance support: Single extremity supported Sitting balance-Leahy Scale: Good     Standing balance support: Bilateral upper extremity supported;During functional activity Standing balance-Leahy Scale: Poor Standing balance comment: relies on RW and kept recliner close to take steps                            Cognition Arousal/Alertness: Awake/alert Behavior During Therapy: WFL for tasks assessed/performed Overall Cognitive Status: Within Functional Limits for tasks assessed                                        Exercises      General Comments        Pertinent Vitals/Pain Pain Assessment: Faces Faces Pain Scale: Hurts little more Pain Location: R LL Pain Descriptors / Indicators: Aching Pain Intervention(s): Limited activity within patient's tolerance;Monitored during session;Premedicated before session;Repositioned;Ice applied    Home Living                      Prior Function            PT Goals (current goals can now be found in the care plan section) Acute Rehab PT  Goals Patient Stated Goal: To get stronger so that she can go home and be independent again. Progress towards PT goals: Progressing toward goals    Frequency    BID      PT Plan Current plan remains appropriate    Co-evaluation              AM-PAC PT "6 Clicks" Mobility   Outcome Measure  Help needed turning from your back to your side while in a flat bed without using bedrails?: None Help needed moving from lying on your back to sitting on the side of a flat bed without using bedrails?: A Little Help needed moving to and from a bed to a chair (including a wheelchair)?: A Little Help needed standing up from a chair using your arms (e.g., wheelchair or bedside chair)?: A Little Help needed to walk in  hospital room?: A Lot Help needed climbing 3-5 steps with a railing? : Total 6 Click Score: 16    End of Session Equipment Utilized During Treatment: Gait belt Activity Tolerance: Patient limited by fatigue Patient left: in chair;with call bell/phone within reach;with chair alarm set Nurse Communication: Mobility status PT Visit Diagnosis: Unsteadiness on feet (R26.81);Muscle weakness (generalized) (M62.81);History of falling (Z91.81);Pain Pain - Right/Left: Right Pain - part of body: Ankle and joints of foot     Time: 3361-2244 PT Time Calculation (min) (ACUTE ONLY): 24 min  Charges:  $Gait Training: 8-22 mins $Therapeutic Activity: 8-22 mins                    Ramond Dial 11/23/2018, 10:11 PM  Mee Hives, PT MS Acute Rehab Dept. Number: [place]

## 2018-11-24 ENCOUNTER — Telehealth: Payer: Self-pay | Admitting: *Deleted

## 2018-11-24 DIAGNOSIS — S82841A Displaced bimalleolar fracture of right lower leg, initial encounter for closed fracture: Secondary | ICD-10-CM | POA: Insufficient documentation

## 2018-11-24 DIAGNOSIS — C189 Malignant neoplasm of colon, unspecified: Secondary | ICD-10-CM | POA: Diagnosis not present

## 2018-11-24 LAB — BASIC METABOLIC PANEL
Anion gap: 9 (ref 5–15)
BUN: 10 mg/dL (ref 8–23)
CALCIUM: 8.7 mg/dL — AB (ref 8.9–10.3)
CO2: 26 mmol/L (ref 22–32)
Chloride: 100 mmol/L (ref 98–111)
Creatinine, Ser: 0.57 mg/dL (ref 0.44–1.00)
GFR calc Af Amer: >60 mL/min (ref >60)
GFR calc non Af Amer: >60 mL/min (ref >60)
Glucose, Bld: 127 mg/dL — ABNORMAL HIGH (ref 70–99)
Potassium: 4.2 mmol/L (ref 3.5–5.1)
Sodium: 135 mmol/L (ref 135–145)

## 2018-11-24 NOTE — Progress Notes (Signed)
Clinical Education officer, museum (CSW) met with patient alone at bedside to discuss D/C plan. CSW explained SNF process. Per patient she does not want to go to SNF and wants to D/C home today. Per patient her daughter Arbie Cookey will come stay with her 24/7. Patient does want home health. CSW explained the benefits of SNF and patient continued to decline SNF. RN case manager aware of above. CSW attempted to contact patient's granddaughter Darrick Meigs to confirm D/C plan however she did not answer and a voicemail was left. Per patient her daughter Arbie Cookey can pick her up today if she is discharged.   [employer], LCSW [phone]

## 2018-11-24 NOTE — Telephone Encounter (Signed)
Patient notified and continue with plan of surgery.  Per Dr.Pabon-   Pease elt her know that the CT did not show any surprises ( no obvious distant mets). We know that she has colon ca and we will continue with the plan of surgery.

## 2018-11-24 NOTE — Progress Notes (Signed)
Discharge instructions and prescriptions given to pt. IV removed. Pt dressed. Will discharge pt home.

## 2018-11-24 NOTE — Care Management (Signed)
Notified Dr/ Poggi that the patient is refusing Rehab, I requested orders for [facility], PT, Aide and SW, a rolling walker and a bedside commode

## 2018-11-24 NOTE — Discharge Summary (Signed)
Physician Discharge Summary  Patient ID: Gloria Allen MRN: [id number] DOB/AGE: 1946-07-01 73 y.o.  Admit date: 11/21/2018 Discharge date: 11/24/2018  Admission Diagnoses:  Ankylosing spondylitis of multiple sites in spine [facility]) [M45.0] Closed right ankle fracture, initial encounter [S82.891A] Ankle dislocation, left, initial encounter [S93.05XA] Severe episode of recurrent major depressive disorder, without psychotic features ([place]) [F33.2] Closed fracture of distal end of left fibula, unspecified fracture morphology, initial encounter [W23.762G]  Discharge Diagnoses: Patient Active Problem List   Diagnosis Date Noted  . Ankylosing spondylitis of multiple sites in spine ([place]) 11/21/2018  . Severe episode of recurrent major depressive disorder, without psychotic features ([place]) 11/21/2018  . Ankle fracture, right 11/21/2018  . Benign neoplasm of ascending colon   . Malignant neoplasm of sigmoid colon ([place])   . Melanosis, colon   . Special screening for malignant neoplasms, colon   . Maculopapular rash   . Colon neoplasm   . Melena   . Columnar epithelial-lined lower esophagus   . Stomach irritation   . IFG (impaired fasting glucose) 04/22/2018  . Ataxia 03/04/2018  . Atypical chest pain 03/04/2018  . SOB (shortness of breath) 01/26/2018  . Laryngopharyngeal reflux (LPR) 08/16/2017  . Suspected exposure to mold 08/05/2017  . Poor historian 08/05/2017  . Lumbar foraminal stenosis (Bilateral: L4-5) (Left: L5-S1) 07/30/2017  . Osteopenia of spine 07/30/2017  . PTSD (post-traumatic stress disorder) 06/20/2017  . Cervical arthritis 06/20/2017  . Bronchial asthma 06/20/2017  . Long term current use of opiate analgesic 06/11/2017  . Chronic pain syndrome 06/11/2017  . Anxiety   . Depression   . GERD (gastroesophageal reflux disease)   . Benign hypertensive renal disease   . History of rotator cuff repair (Right) 03/26/2017    Past Medical History:  Diagnosis Date  . Anxiety    . Arthritis   . Cervical central spinal stenosis (C3-C7) (worse at C4-5) 07/30/2017  . Cervical foraminal stenosis (C4-5 and C5-6) (Bilateral) (L>R) 07/30/2017  . Chronic lower extremity pain (Fourth Area of Pain) (Bilateral) (R>L) 06/11/2017  . Chronic neck pain (Primary Area of Pain) (Bilateral) (R>L) 06/11/2017  . Chronic sacroiliac joint pain (Bilateral) (R>L) 06/11/2017  . Chronic shoulder pain [facility]) (Right) 06/11/2017  . Chronic upper extremity pain (Fifth Area of Pain) (Bilateral) (R>L) 06/11/2017  . DDD (degenerative disc disease), cervical 07/30/2017  . DDD (degenerative disc disease), lumbar 07/30/2017  . Depression   . DISH (diffuse idiopathic skeletal hyperostosis) 07/30/2017  . Dyspnea    with exertion  . Entrapment syndrome 06/20/2017   2002 on R and 2010 on L  . Full thickness rotator cuff tear 06/12/2017  . GERD (gastroesophageal reflux disease)   . Grade 1 Anterolisthesis of L4 over L5 07/30/2017  . Headache   . Hx: UTI (urinary tract infection)   . Hypertension   . Inflammation of joint of shoulder region 01/15/2017  . Lumbar central spinal stenosis (L4-5) 07/30/2017  . Lumbar disc protrusion (Left: L5-S1) (Right: L1-2) 07/30/2017   L5-S1 left foraminal protrusion with L5 impingement. L1-2 right paracentral protrusion without impingement.  . Lumbar facet arthropathy (Bilateral) 07/30/2017  . Lumbar facet syndrome (Bilateral) (R>L) 07/30/2017  . Osteoarthritis of shoulder (Right) 01/15/2017   Transfusion: None.   Consultants (if any):   Discharged Condition: Improved  Hospital Course: Gloria Allen is an 73 y.o. female who was admitted 11/21/2018 with a diagnosis of closed displaced posterior fracture dislocation of the right ankle and went to the operating room on 11/21/2018 and  underwent the above named procedures.    Surgeries: Procedure(s): OPEN REDUCTION INTERNAL FIXATION (ORIF) ANKLE FRACTURE on 11/21/2018 Patient tolerated the surgery well.  Taken to PACU where she was stabilized and then transferred to the orthopedic floor.  Started on Lovenox 40mg  q 24 hrs. Foot pumps applied bilaterally at 80 mm. Heels elevated on bed with rolled towels. No evidence of DVT. Negative [place]. Physical therapy started on day #1 for gait training and transfer. OT started day #1 for ADL and assisted devices.  Patient's IV was removed on POD3.  Implants: [employer] ALPS 7-hole composite locking plate and screws  She was given perioperative antibiotics:  Anti-infectives (From admission, onward)   Start     Dose/Rate Route Frequency Ordered Stop   11/22/18 0100  ceFAZolin (ANCEF) 2 g in dextrose 5 % 100 mL IVPB     2 g 200 mL/hr over 30 Minutes Intravenous Every 6 hours 11/22/18 0036 11/22/18 1253   11/22/18 0018  ceFAZolin (ANCEF) IVPB 2g/100 mL premix  Status:  Discontinued     2 g 200 mL/hr over 30 Minutes Intravenous Every 6 hours 11/22/18 0018 11/22/18 0053   11/21/18 2000  ceFAZolin (ANCEF) IVPB 2g/100 mL premix  Status:  Discontinued     2 g 200 mL/hr over 30 Minutes Intravenous  Once 11/21/18 1758 11/22/18 0017    .  She was given sequential compression devices, early ambulation, and Lovenox for DVT prophylaxis.  She benefited maximally from the hospital stay and there were no complications.    Recent vital signs:  Vitals:   11/23/18 2320 11/24/18 0757  BP: (!) 190/62 (!) 182/62  Pulse: 60 62  Resp: 18 17  Temp: 98 F (36.7 C) 98.2 F (36.8 C)  SpO2: 97% 99%    Recent laboratory studies:  Lab Results  Component Value Date   HGB 12.6 11/21/2018   HGB 12.2 11/12/2018   HGB 12.0 08/21/2018   Lab Results  Component Value Date   WBC 12.9 (H) 11/21/2018   PLT 389 11/21/2018   Lab Results  Component Value Date   INR 1.04 11/21/2018   Lab Results  Component Value Date   NA 135 11/24/2018   K 4.2 11/24/2018   CL 100 11/24/2018   CO2 26 11/24/2018   BUN 10 11/24/2018   CREATININE 0.57 11/24/2018   GLUCOSE 127 (H)  11/24/2018    Discharge Medications:   Allergies as of 11/24/2018      Reactions   Ace Inhibitors Swelling   Angiotensin Receptor Blockers Swelling   Sulfa Antibiotics Itching   Chlorthalidone Rash   Flexeril [cyclobenzaprine] Rash      Medication List    TAKE these medications   albuterol 108 (90 Base) MCG/ACT inhaler Commonly known as:  PROVENTIL HFA;VENTOLIN HFA Inhale 2 puffs into the lungs every 6 (six) hours as needed for wheezing or shortness of breath.   ANORO ELLIPTA 62.5-25 MCG/INH Aepb Generic drug:  umeclidinium-vilanterol INHALE 1 PUFF BY MOUTH DAILY What changed:  See the new instructions.   bisacodyl 5 MG EC tablet Commonly known as:  bisacodyl Take 4 tablets (5 mg each) at 8 am the day before surgery.   BREO ELLIPTA 200-25 MCG/INH Aepb Generic drug:  fluticasone furoate-vilanterol Inhale 1 puff into the lungs daily.   clonazePAM 0.5 MG tablet Commonly known as:  KLONOPIN Take 0.5 mg by mouth 3 (three) times daily.   cloNIDine 0.3 MG tablet Commonly known as:  CATAPRES Take 2 tablets (0.6 mg total)  by mouth 2 (two) times daily. What changed:    how much to take  when to take this   cyclobenzaprine 5 MG tablet Commonly known as:  FLEXERIL Take 5 mg by mouth at bedtime.   enoxaparin 40 MG/0.4ML injection Commonly known as:  LOVENOX Inject 0.4 mLs (40 mg total) into the skin daily.   Erythromycin 500 MG Tbec Take 500 mg by mouth as directed. Take two tablets at 8 am, two tablets at 2 pm, and two tablets at 8 pm the day before surgery.   hydrALAZINE 100 MG tablet Commonly known as:  APRESOLINE Take 1 tablet (100 mg total) by mouth 3 (three) times daily.   hydrALAZINE 50 MG tablet Commonly known as:  APRESOLINE TAKE 1 TABLET BY MOUTH THREE TIMES DAILY   hydrOXYzine 10 MG tablet Commonly known as:  ATARAX/VISTARIL TAKE 1 TABLET BY MOUTH THREE TIMES A DAY AS NEEDED What changed:  reasons to take this   lovastatin 40 MG tablet Commonly  known as:  MEVACOR Take 1 tablet (40 mg total) by mouth at bedtime.   meclizine 12.5 MG tablet Commonly known as:  ANTIVERT Take 12.5 mg by mouth 3 (three) times daily as needed for dizziness.   methocarbamol 500 MG tablet Commonly known as:  ROBAXIN Take 1 tablet (500 mg total) by mouth at bedtime as needed for muscle spasms.   metoprolol succinate 50 MG 24 hr tablet Commonly known as:  TOPROL-XL Take 50 mg by mouth daily. Take with or immediately following a meal.   metoprolol succinate 100 MG 24 hr tablet Commonly known as:  TOPROL-XL TAKE 1 TABLET BY MOUTH DAILY   mirtazapine 15 MG tablet Commonly known as:  REMERON take 1 tablet by mouth at bedtime for APPETITIE What changed:    how much to take  how to take this  when to take this   montelukast 10 MG tablet Commonly known as:  SINGULAIR Take 1 tablet (10 mg total) by mouth at bedtime as needed.   neomycin 500 MG tablet Commonly known as:  MYCIFRADIN Take two tablets at 8 am, two tablets at 2 pm, and two tablets at 8 pm the day before surgery.   nitrofurantoin 100 MG capsule Commonly known as:  MACRODANTIN Take 100 mg by mouth daily.   omeprazole 40 MG capsule Commonly known as:  PRILOSEC Take 1 capsule (40 mg total) by mouth 2 (two) times daily.   oxybutynin 5 MG tablet Commonly known as:  DITROPAN TAKE 1 TABLET BY MOUTH TWICE DAILY   polyethylene glycol powder powder Commonly known as:  GLYCOLAX/MIRALAX 255 grams one bottle for bowel prep   Potassium 99 MG Tabs Take 1 tablet by mouth daily as needed (cramps).   sertraline 100 MG tablet Commonly known as:  ZOLOFT Take 2 tablets (200 mg total) by mouth at bedtime.   terazosin 5 MG capsule Commonly known as:  HYTRIN TAKE 1 CAPSULE BY MOUTH ONCE DAILY AT BEDTIME   traMADol 50 MG tablet Commonly known as:  ULTRAM Take 1 tablet (50 mg total) by mouth every 6 (six) hours.   traZODone 50 MG tablet Commonly known as:  DESYREL Take 0.5-1 tablets  (25-50 mg total) by mouth at bedtime as needed for sleep. What changed:    how much to take  when to take this   triamcinolone cream 0.1 % Commonly known as:  KENALOG APPLY TOPICALLY TWICE DAILY AS NEEDED FOR ITCHING What changed:  See the new instructions.  ASK your doctor about these medications   fluconazole 200 MG tablet Commonly known as:  DIFLUCAN Take 2 tablets (400 mg total) by mouth daily for 1 day, THEN 1 tablet (200 mg total) daily for 14 days. Start taking on:  November 07, 2018 Ask about: Should I take this medication?            Durable Medical Equipment  (From admission, onward)         Start     Ordered   11/22/18 0019  DME Walker rolling  Once    Question:  Patient needs a walker to treat with the following condition  Answer:  Ankle fracture, right   11/22/18 0018   11/22/18 0019  DME 3 n 1  Once     11/22/18 0018   11/22/18 0019  DME Bedside commode  Once    Question:  Patient needs a bedside commode to treat with the following condition  Answer:  Ankle fracture, right   11/22/18 0018         Diagnostic Studies: Dg Ankle 2 Views Right  Result Date: 11/21/2018 CLINICAL DATA:  73 y/o  F; ORIF of right ankle fracture. EXAM: DG C-ARM 61-120 MIN; RIGHT ANKLE - 2 VIEW COMPARISON:  2120 right ankle radiographs. FINDINGS: Three intraoperative fluoroscopic images demonstrating plate and screw fixation of a lower fibular fracture in good alignment post fixation. Fluoro time is 9 seconds. IMPRESSION: ORIF of right ankle fracture.  Fluoro time is 9 seconds. Electronically Signed   By: Kristine Garbe M.D.   On: 11/21/2018 23:09   Dg Ankle Complete Right  Result Date: 11/21/2018 CLINICAL DATA:  Acute right ankle pain and swelling after fall. EXAM: RIGHT ANKLE - COMPLETE 3+ VIEW COMPARISON:  None. FINDINGS: Posterior dislocation of the talus with respect to the distal tibia. Posteriorly displaced and impacted fracture of the distal fibula. Probable  posterior malleolar fracture. Postsurgical changes of the distal first metatarsal. Osteopenia. Diffuse soft tissue swelling about the ankle. IMPRESSION: 1. Distal fibular fracture with posterior dislocation of the tibiotalar joint. 2. Probable posterior malleolus fracture. Electronically Signed   By: Titus Dubin M.D.   On: 11/21/2018 15:44   Ct Abdomen Pelvis W Contrast  Result Date: 11/20/2018 CLINICAL DATA:  Adenocarcinoma of sigmoid colon. EXAM: CT ABDOMEN AND PELVIS WITH CONTRAST TECHNIQUE: Multidetector CT imaging of the abdomen and pelvis was performed using the standard protocol following bolus administration of intravenous contrast. CONTRAST:  132mL ISOVUE-300 IOPAMIDOL (ISOVUE-300) INJECTION 61% COMPARISON:  CT scan of March 13, 2009. FINDINGS: Lower chest: No acute abnormality. Hepatobiliary: Status post cholecystectomy. Stable right hepatic cyst is noted. No biliary dilatation is noted. Pancreas: Unremarkable. No pancreatic ductal dilatation or surrounding inflammatory changes. Spleen: Normal in size without focal abnormality. Adrenals/Urinary Tract: Adrenal glands are unremarkable. Kidneys are normal, without renal calculi, focal lesion, or hydronephrosis. Bladder is unremarkable. Stomach/Bowel: Status post appendectomy. Stomach appears normal. No bowel dilatation is noted. There is noted moderate wall thickening involving the distal portion of the transverse colon which may represent lack of distension, but inflammation can not be excluded. Mild wall thickening of proximal descending colon is noted. Focal stricture and wall thickening is seen involving the sigmoid colon which may correspond to malignancy seen on colonoscopy. Vascular/Lymphatic: Atherosclerosis of abdominal aorta is noted without aneurysm or dissection. 1 cm left internal iliac lymph node is noted which could represent metastatic disease. No other definite adenopathy is noted. Reproductive: Status post hysterectomy. No adnexal  masses. Other: No  abdominal wall hernia or abnormality. No abdominopelvic ascites. Musculoskeletal: No acute or significant osseous findings. IMPRESSION: Focal wall thickening and stricture is seen involving sigmoid colon which most likely corresponds to malignancy seen on colonoscopy. 1 cm left internal iliac lymph node is noted concerning for possible metastatic disease. Moderate wall thickening is seen involving distal portion of transverse colon which may represent lack of distension, but inflammation can not be excluded. Mild wall thickening of proximal descending colon is also noted of uncertain etiology. Aortic Atherosclerosis (ICD10-I70.0). Electronically Signed   By: Marijo Conception, M.D.   On: 11/20/2018 14:56   Dg Chest Portable 1 View  Result Date: 11/21/2018 CLINICAL DATA:  Preop exam EXAM: PORTABLE CHEST 1 VIEW COMPARISON:  01/15/2018 FINDINGS: Cardiac enlargement without heart failure. Lungs are clear without infiltrate or effusion. IMPRESSION: No active disease. Electronically Signed   By: Franchot Gallo M.D.   On: 11/21/2018 19:11   Dg C-arm 1-60 Min  Result Date: 11/21/2018 CLINICAL DATA:  73 y/o  F; ORIF of right ankle fracture. EXAM: DG C-ARM 61-120 MIN; RIGHT ANKLE - 2 VIEW COMPARISON:  2120 right ankle radiographs. FINDINGS: Three intraoperative fluoroscopic images demonstrating plate and screw fixation of a lower fibular fracture in good alignment post fixation. Fluoro time is 9 seconds. IMPRESSION: ORIF of right ankle fracture.  Fluoro time is 9 seconds. Electronically Signed   By: Kristine Garbe M.D.   On: 11/21/2018 23:09   Disposition: Plan for discharge to SNF or home today based on progress with PT.  Follow-up Information    Poggi, Marshall Cork, MD Follow up in 1 week(s).   Specialty:  Surgery Contact information: [place] [address] [phone]          Signed: Judson Roch PA-C 11/24/2018, 8:08 AM

## 2018-11-24 NOTE — Care Management (Signed)
Called Sharmon Revere with [facility], she has accepted the patient into service for [facility],

## 2018-11-24 NOTE — Care Management Important Message (Signed)
Important Message  Patient Details  Name: GAELYN TUKES MRN: [id number] Date of Birth: 11-20-1945   Medicare Important Message Given:  Yes    Juliann Pulse A Steve Gregg 11/24/2018, 12:12 PM

## 2018-11-24 NOTE — Care Management Note (Signed)
Case Management Note  Patient Details  Name: LONIA ROANE MRN: [id number] Date of Birth: 22-Apr-1946  Subjective/Objective:                    Action/Plan: Met with the patient to discuss DC plans This patient refuses REHAB, does not have any DME at home Needs a RW and 3 in 1 notified Corene Cornea at [facility] Provided the [facility] list per [email or web address] Patient would like to use [facility] notified Malachy Mood and a second choice would be [facility]  Patient lives alone, her daughter will come stay with her Has transportation    Expected Discharge Date:                  Expected Discharge Plan:     In-House Referral:     Discharge planning Services  CM Consult  Post Acute Care Choice:  Home Health Choice offered to:  Patient  DME Arranged:  3-N-1, Walker rolling DME Agency:     HH Arranged:  PT, RN, Social Work, Nurse's Aide [place] Agency:     Status of Service:  In process, will continue to follow  If discussed at Long Length of Stay Meetings, dates discussed:    Additional Comments:  Su Hilt, RN 11/24/2018, 10:14 AM

## 2018-11-24 NOTE — Progress Notes (Addendum)
PPhysical Therapy Treatment Patient Details Name: Gloria Allen MRN: [id number] DOB: 1945-12-14 Today's Date: 11/24/2018    History of Present Illness Pt is a 73 year old female s/p R ankle ORIF (11/21/2018) following a fall on ice.  Pt was walking to her mailbox when she slipped.  Imaging revealed a distal fibular fracture with posterior dislocation of tib-talar joint.  PMH includes chronic shoulder pain, lumbar disc protrusions and chronic neck pain.    PT Comments    Initial attempt, pt with nursing; second attempt, pt agreeable to PT. Reports 7/10 pain in R ankle. Pt has just received medication. Pt demonstrates bed mobility and STS transfers with Min A. Pt ambulates progressive distance this session (37ft) requiring occasional Min A particularly when turning to steady, otherwise, Min guard. Pt notes fatigue with walk distance. Pt plans to go home with daughters in home help versus further rehab in a skilled nursing facility; pt will need rolling walker and BSC.   Follow Up Recommendations  SNF;Other (comment)(pt refusing SNF and wishes to go home with dtr to help)     Equipment Recommendations  Rolling walker with 5" wheels;3in1 (PT)    Recommendations for Other Services       Precautions / Restrictions Precautions Precautions: Fall Restrictions Weight Bearing Restrictions: Yes RLE Weight Bearing: Non weight bearing    Mobility  Bed Mobility Overal bed mobility: Needs Assistance Bed Mobility: Supine to Sit     Supine to sit: Min assist     General bed mobility comments: For RLE  Transfers Overall transfer level: Needs assistance Equipment used: Rolling walker (2 wheeled) Transfers: Sit to/from Stand Sit to Stand: Min assist         General transfer comment: Cues for hand placement; assist to steady  Ambulation/Gait Ambulation/Gait assistance: Min guard;Min assist Gait Distance (Feet): 40 Feet Assistive device: Rolling walker (2 wheeled) Gait  Pattern/deviations: (Hop to)     General Gait Details: Increased distance, mild assist needed to steady particularly with turning and chair approach   Stairs             Wheelchair Mobility    Modified Rankin (Stroke Patients Only)       Balance Overall balance assessment: Needs assistance Sitting-balance support: Bilateral upper extremity supported;Feet supported Sitting balance-Leahy Scale: Good     Standing balance support: Bilateral upper extremity supported Standing balance-Leahy Scale: Fair                              Cognition Arousal/Alertness: Awake/alert Behavior During Therapy: WFL for tasks assessed/performed Overall Cognitive Status: Within Functional Limits for tasks assessed                                        Exercises General Exercises - Lower Extremity Ankle Circles/Pumps: AROM;Left;10 reps;Seated Quad Sets: Strengthening;Both;10 reps Gluteal Sets: Strengthening;Both;10 reps Long Arc Quad: AAROM;Right;10 reps;Seated(AROM L) Straight Leg Raises: AROM;Right;10 reps;Standing Hip Flexion/Marching: AROM;Right;10 reps;Standing    General Comments        Pertinent Vitals/Pain Pain Assessment: 0-10 Pain Score: 7  Pain Location: R LL Pain Descriptors / Indicators: Constant;Aching;Throbbing Pain Intervention(s): Monitored during session;Premedicated before session;Repositioned    Home Living                      Prior Function  PT Goals (current goals can now be found in the care plan section) Progress towards PT goals: Progressing toward goals    Frequency    BID      PT Plan Current plan remains appropriate    Co-evaluation              AM-PAC PT "6 Clicks" Mobility   Outcome Measure  Help needed turning from your back to your side while in a flat bed without using bedrails?: A Little Help needed moving from lying on your back to sitting on the side of a flat bed  without using bedrails?: A Little Help needed moving to and from a bed to a chair (including a wheelchair)?: A Little Help needed standing up from a chair using your arms (e.g., wheelchair or bedside chair)?: A Little Help needed to walk in hospital room?: A Little Help needed climbing 3-5 steps with a railing? : Total 6 Click Score: 16    End of Session Equipment Utilized During Treatment: Gait belt Activity Tolerance: Patient tolerated treatment well Patient left: in chair;with call bell/phone within reach;with chair alarm set   PT Visit Diagnosis: Unsteadiness on feet (R26.81);Muscle weakness (generalized) (M62.81);History of falling (Z91.81);Pain Pain - Right/Left: Right Pain - part of body: Ankle and joints of foot     Time: 0254-2706 PT Time Calculation (min) (ACUTE ONLY): 28 min  Charges:  $Gait Training: 8-22 mins $Therapeutic Exercise: 8-22 mins                      Larae Grooms, PTA 11/24/2018, 12:04 PM

## 2018-11-24 NOTE — Telephone Encounter (Signed)
-----   Message from Jules Husbands, MD sent at 11/24/2018  8:42 AM EST ----- Please elt her know that the CT did not show any surprises ( no obvious distant mets). We know that she has colon ca and we will continue with the plan of surgery. ----- Message ----- From: Interface, Rad Results In Sent: 11/20/2018   2:58 PM EST To: Jules Husbands, MD

## 2018-11-24 NOTE — Progress Notes (Signed)
Subjective: 3 Days Post-Op Procedure(s) (LRB): OPEN REDUCTION INTERNAL FIXATION (ORIF) ANKLE FRACTURE (Right) Patient reports pain as mild.   Patient seen in rounds with Dr. Roland Rack. Patient is well, and has had no acute complaints or problems Plan is to go to rehab after hospital stay, she wants to go home however PT continues to recommend SNF for period of time. Negative for chest pain and shortness of breath Fever: no Gastrointestinal: Negative for nausea and vomiting  Objective: Vital signs in last 24 hours: Temp:  [98 F (36.7 C)-98.7 F (37.1 C)] 98.2 F (36.8 C) (02/24 0757) Pulse Rate:  [60-62] 62 (02/24 0757) Resp:  [16-18] 17 (02/24 0757) BP: (150-190)/(62) 182/62 (02/24 0757) SpO2:  [97 %-99 %] 99 % (02/24 0757)  Intake/Output from previous day:  Intake/Output Summary (Last 24 hours) at 11/24/2018 0805 Last data filed at 11/24/2018 0450 Gross per 24 hour  Intake 480 ml  Output 100 ml  Net 380 ml    Intake/Output this shift: No intake/output data recorded.  Labs: Recent Labs    11/21/18 1708  HGB 12.6   Recent Labs    11/21/18 1708  WBC 12.9*  RBC 4.74  HCT 40.6  PLT 389   Recent Labs    11/23/18 0545 11/24/18 0426  NA 135 135  K 3.8 4.2  CL 102 100  CO2 25 26  BUN 12 10  CREATININE 0.62 0.57  GLUCOSE 116* 127*  CALCIUM 8.4* 8.7*   Recent Labs    11/21/18 1708  INR 1.04   EXAM General - Patient is Alert and Oriented Extremity - Sensation intact distally Dorsiflexion/Plantar flexion intact Dressing/Incision - clean, dry, no drainage Motor Function - intact, moving foot and toes well on exam.   Intact to light touch over the dorsal and volar aspect of her toes.  Past Medical History:  Diagnosis Date  . Anxiety   . Arthritis   . Cervical central spinal stenosis (C3-C7) (worse at C4-5) 07/30/2017  . Cervical foraminal stenosis (C4-5 and C5-6) (Bilateral) (L>R) 07/30/2017  . Chronic lower extremity pain (Fourth Area of Pain)  (Bilateral) (R>L) 06/11/2017  . Chronic neck pain (Primary Area of Pain) (Bilateral) (R>L) 06/11/2017  . Chronic sacroiliac joint pain (Bilateral) (R>L) 06/11/2017  . Chronic shoulder pain [facility] Area of Pain) (Right) 06/11/2017  . Chronic upper extremity pain (Fifth Area of Pain) (Bilateral) (R>L) 06/11/2017  . DDD (degenerative disc disease), cervical 07/30/2017  . DDD (degenerative disc disease), lumbar 07/30/2017  . Depression   . DISH (diffuse idiopathic skeletal hyperostosis) 07/30/2017  . Dyspnea    with exertion  . Entrapment syndrome 06/20/2017   2002 on R and 2010 on L  . Full thickness rotator cuff tear 06/12/2017  . GERD (gastroesophageal reflux disease)   . Grade 1 Anterolisthesis of L4 over L5 07/30/2017  . Headache   . Hx: UTI (urinary tract infection)   . Hypertension   . Inflammation of joint of shoulder region 01/15/2017  . Lumbar central spinal stenosis (L4-5) 07/30/2017  . Lumbar disc protrusion (Left: L5-S1) (Right: L1-2) 07/30/2017   L5-S1 left foraminal protrusion with L5 impingement. L1-2 right paracentral protrusion without impingement.  . Lumbar facet arthropathy (Bilateral) 07/30/2017  . Lumbar facet syndrome (Bilateral) (R>L) 07/30/2017  . Osteoarthritis of shoulder (Right) 01/15/2017   Assessment/Plan: 3 Days Post-Op Procedure(s) (LRB): OPEN REDUCTION INTERNAL FIXATION (ORIF) ANKLE FRACTURE (Right) Active Problems:   Ankle fracture, right  Estimated body mass index is 26.29 kg/m as calculated from the following:  Height as of this encounter: 5\' 5"  (1.651 m).   Weight as of this encounter: 71.7 kg.  Advance diet. Continue physical therapy this AM. Plan for discharge either to SNF or home today based on progress with PT. Follow-up with [facility] clinic in 10-14 days. DVT Prophylaxis - Lovenox Non-weight-Bearing to right leg  J. Cameron Proud, PA-C [facility] Orthopaedic Surgery 11/24/2018, 8:05 AM

## 2018-11-25 ENCOUNTER — Encounter
Admission: RE | Admit: 2018-11-25 | Discharge: 2018-11-25 | Disposition: A | Discharge status: Home / Self Care | Payer: Medicare Other | Source: Ambulatory Visit | Attending: Surgery | Admitting: Surgery

## 2018-11-25 ENCOUNTER — Other Ambulatory Visit: Payer: Self-pay

## 2018-11-25 ENCOUNTER — Telehealth: Payer: Self-pay | Admitting: Surgery

## 2018-11-25 DIAGNOSIS — C188 Malignant neoplasm of overlapping sites of colon: Secondary | ICD-10-CM | POA: Diagnosis not present

## 2018-11-25 DIAGNOSIS — Z792 Long term (current) use of antibiotics: Secondary | ICD-10-CM | POA: Diagnosis not present

## 2018-11-25 DIAGNOSIS — M7500 Adhesive capsulitis of unspecified shoulder: Secondary | ICD-10-CM | POA: Diagnosis not present

## 2018-11-25 DIAGNOSIS — Z01818 Encounter for other preprocedural examination: Secondary | ICD-10-CM

## 2018-11-25 DIAGNOSIS — E8770 Fluid overload, unspecified: Secondary | ICD-10-CM | POA: Diagnosis not present

## 2018-11-25 DIAGNOSIS — J40 Bronchitis, not specified as acute or chronic: Secondary | ICD-10-CM | POA: Diagnosis not present

## 2018-11-25 DIAGNOSIS — D62 Acute posthemorrhagic anemia: Secondary | ICD-10-CM | POA: Diagnosis not present

## 2018-11-25 DIAGNOSIS — Z433 Encounter for attention to colostomy: Secondary | ICD-10-CM | POA: Diagnosis not present

## 2018-11-25 DIAGNOSIS — C19 Malignant neoplasm of rectosigmoid junction: Secondary | ICD-10-CM | POA: Diagnosis not present

## 2018-11-25 DIAGNOSIS — D6489 Other specified anemias: Secondary | ICD-10-CM | POA: Diagnosis not present

## 2018-11-25 DIAGNOSIS — G894 Chronic pain syndrome: Secondary | ICD-10-CM | POA: Diagnosis not present

## 2018-11-25 DIAGNOSIS — Z7951 Long term (current) use of inhaled steroids: Secondary | ICD-10-CM | POA: Diagnosis not present

## 2018-11-25 DIAGNOSIS — Z9181 History of falling: Secondary | ICD-10-CM | POA: Diagnosis not present

## 2018-11-25 DIAGNOSIS — M4692 Unspecified inflammatory spondylopathy, cervical region: Secondary | ICD-10-CM | POA: Diagnosis not present

## 2018-11-25 DIAGNOSIS — Z87891 Personal history of nicotine dependence: Secondary | ICD-10-CM | POA: Diagnosis not present

## 2018-11-25 DIAGNOSIS — R0902 Hypoxemia: Secondary | ICD-10-CM | POA: Diagnosis not present

## 2018-11-25 DIAGNOSIS — M80062D Age-related osteoporosis with current pathological fracture, left lower leg, subsequent encounter for fracture with routine healing: Secondary | ICD-10-CM | POA: Diagnosis not present

## 2018-11-25 DIAGNOSIS — M80071D Age-related osteoporosis with current pathological fracture, right ankle and foot, subsequent encounter for fracture with routine healing: Secondary | ICD-10-CM | POA: Diagnosis not present

## 2018-11-25 DIAGNOSIS — J45909 Unspecified asthma, uncomplicated: Secondary | ICD-10-CM | POA: Diagnosis not present

## 2018-11-25 DIAGNOSIS — M48061 Spinal stenosis, lumbar region without neurogenic claudication: Secondary | ICD-10-CM | POA: Diagnosis not present

## 2018-11-25 DIAGNOSIS — M450 Ankylosing spondylitis of multiple sites in spine: Secondary | ICD-10-CM | POA: Diagnosis not present

## 2018-11-25 DIAGNOSIS — I10 Essential (primary) hypertension: Secondary | ICD-10-CM

## 2018-11-25 DIAGNOSIS — C189 Malignant neoplasm of colon, unspecified: Secondary | ICD-10-CM | POA: Diagnosis not present

## 2018-11-25 DIAGNOSIS — S82891D Other fracture of right lower leg, subsequent encounter for closed fracture with routine healing: Secondary | ICD-10-CM | POA: Diagnosis not present

## 2018-11-25 DIAGNOSIS — Z7901 Long term (current) use of anticoagulants: Secondary | ICD-10-CM | POA: Diagnosis not present

## 2018-11-25 DIAGNOSIS — R0600 Dyspnea, unspecified: Secondary | ICD-10-CM | POA: Diagnosis not present

## 2018-11-25 DIAGNOSIS — Z85038 Personal history of other malignant neoplasm of large intestine: Secondary | ICD-10-CM | POA: Diagnosis not present

## 2018-11-25 DIAGNOSIS — Z66 Do not resuscitate: Secondary | ICD-10-CM | POA: Diagnosis not present

## 2018-11-25 DIAGNOSIS — M4802 Spinal stenosis, cervical region: Secondary | ICD-10-CM | POA: Diagnosis not present

## 2018-11-25 DIAGNOSIS — J9601 Acute respiratory failure with hypoxia: Secondary | ICD-10-CM | POA: Diagnosis not present

## 2018-11-25 DIAGNOSIS — D649 Anemia, unspecified: Secondary | ICD-10-CM | POA: Diagnosis not present

## 2018-11-25 DIAGNOSIS — K219 Gastro-esophageal reflux disease without esophagitis: Secondary | ICD-10-CM | POA: Diagnosis not present

## 2018-11-25 DIAGNOSIS — Z4801 Encounter for change or removal of surgical wound dressing: Secondary | ICD-10-CM | POA: Diagnosis not present

## 2018-11-25 DIAGNOSIS — Z87440 Personal history of urinary (tract) infections: Secondary | ICD-10-CM | POA: Diagnosis not present

## 2018-11-25 DIAGNOSIS — D72829 Elevated white blood cell count, unspecified: Secondary | ICD-10-CM | POA: Diagnosis not present

## 2018-11-25 DIAGNOSIS — Z483 Aftercare following surgery for neoplasm: Secondary | ICD-10-CM | POA: Diagnosis not present

## 2018-11-25 DIAGNOSIS — C187 Malignant neoplasm of sigmoid colon: Secondary | ICD-10-CM | POA: Diagnosis not present

## 2018-11-25 HISTORY — DX: Unspecified asthma, uncomplicated: J45.909

## 2018-11-25 NOTE — Care Management (Signed)
EKG reviewed, poor R wave progression, otherwise ok, no change from previous.

## 2018-11-25 NOTE — Telephone Encounter (Signed)
Patient has called and left a message with the answering services. The message was faxed. Stating: broken leg and she is at home at this time. She would like to be called back in reference to her scheduled surgery with Dr Dahlia Byes on 11/27/18.  [phone]

## 2018-11-25 NOTE — Telephone Encounter (Signed)
Patient is on the way to pre admit.

## 2018-11-25 NOTE — Patient Instructions (Signed)
Your procedure is scheduled on: Thursday 11/27/2018 Report to [place]. To find out your arrival time please call [phone] between 1PM - 3PM on Wednesday 11/26/2018.  Remember: Instructions that are not followed completely may result in serious medical risk, up to and including death, or upon the discretion of your surgeon and anesthesiologist your surgery may need to be rescheduled.     _X__ 1. Do not eat food after midnight the night before your procedure.                 No gum chewing or hard candies. You may drink clear liquids up to 2 hours                 before you are scheduled to arrive for your surgery- DO not drink clear                 liquids within 2 hours of the start of your surgery.                 Clear Liquids include:  water, apple juice without pulp, clear carbohydrate                 drink such as Clearfast or Gatorade, Black Coffee or Tea (Do not add                 anything to coffee or tea).  __X__2.  On the morning of surgery brush your teeth with toothpaste and water, you                 may rinse your mouth with mouthwash if you wish.  Do not swallow any              toothpaste of mouthwash.     _X__ 3.  No Alcohol for 24 hours before or after surgery.   _X__ 4.  Do Not Smoke or use e-cigarettes For 24 Hours Prior to Your Surgery.                 Do not use any chewable tobacco products for at least 6 hours prior to                 surgery.  ____  5.  Bring all medications with you on the day of surgery if instructed.   __X__  6.  Notify your doctor if there is any change in your medical condition      (cold, fever, infections).     Do not wear jewelry, make-up, hairpins, clips or nail polish. Do not wear lotions, powders, or perfumes.  Do not shave 48 hours prior to surgery. Men may shave face and neck. Do not bring valuables to the hospital.    [facility] is not responsible for any belongings  or valuables.  Contacts, dentures/partials or body piercings may not be worn into surgery. Bring a case for your contacts, glasses or hearing aids, a denture cup will be supplied. Leave your suitcase in the car. After surgery it may be brought to your room. For patients admitted to the hospital, discharge time is determined by your treatment team.   Patients discharged the day of surgery will not be allowed to drive home.   Please read over the following fact sheets that you were given:   MRSA Information  __X__ Take these medicines the morning of surgery with A SIP OF WATER:  1. clonazePAM (KLONOPIN)  2. cloNIDine (CATAPRES)   3. hydrALAZINE (APRESOLINE)  4. hydrOXYzine (ATARAX/VISTARIL)  5. metoprolol succinate (TOPROL-XL)   6. omeprazole (PRILOSEC)  7. oxybutynin (DITROPAN)  ____ Fleet Enema (as directed)   __X__ Use CHG Soap/SAGE wipes as directed  __X__ Use inhalers on the day of surgery USE ALL INHALERS BEFORE ARRIVAL  ____ Stop metformin/Janumet/Farxiga 2 days prior to surgery    ____ Take 1/2 of usual insulin dose the night before surgery. No insulin the morning          of surgery.   ____ Stop Blood Thinners Coumadin/Plavix/Xarelto/Pleta/Pradaxa/Eliquis/Effient/Aspirin  on   Or contact your Surgeon, Cardiologist or Medical Doctor regarding  ability to stop your blood thinners  __X__ Stop Anti-inflammatories 7 days before surgery such as Advil, Ibuprofen, Motrin,  BC or Goodies Powder, Naprosyn, Naproxen, Aleve, Aspirin    __X__ Stop all herbal supplements, fish oil or vitamin E until after surgery.    ____ Bring C-Pap to the hospital.

## 2018-11-26 ENCOUNTER — Ambulatory Visit: Payer: Medicare Other | Admitting: Gastroenterology

## 2018-11-26 ENCOUNTER — Telehealth: Payer: Self-pay | Admitting: *Deleted

## 2018-11-26 DIAGNOSIS — M80062D Age-related osteoporosis with current pathological fracture, left lower leg, subsequent encounter for fracture with routine healing: Secondary | ICD-10-CM | POA: Diagnosis not present

## 2018-11-26 DIAGNOSIS — Z85038 Personal history of other malignant neoplasm of large intestine: Secondary | ICD-10-CM | POA: Diagnosis not present

## 2018-11-26 DIAGNOSIS — M7500 Adhesive capsulitis of unspecified shoulder: Secondary | ICD-10-CM | POA: Diagnosis not present

## 2018-11-26 DIAGNOSIS — M48061 Spinal stenosis, lumbar region without neurogenic claudication: Secondary | ICD-10-CM | POA: Diagnosis not present

## 2018-11-26 DIAGNOSIS — Z4801 Encounter for change or removal of surgical wound dressing: Secondary | ICD-10-CM | POA: Diagnosis not present

## 2018-11-26 DIAGNOSIS — Z483 Aftercare following surgery for neoplasm: Secondary | ICD-10-CM | POA: Diagnosis not present

## 2018-11-26 DIAGNOSIS — Z433 Encounter for attention to colostomy: Secondary | ICD-10-CM | POA: Diagnosis not present

## 2018-11-26 DIAGNOSIS — M80071D Age-related osteoporosis with current pathological fracture, right ankle and foot, subsequent encounter for fracture with routine healing: Secondary | ICD-10-CM | POA: Diagnosis not present

## 2018-11-26 DIAGNOSIS — J40 Bronchitis, not specified as acute or chronic: Secondary | ICD-10-CM | POA: Diagnosis not present

## 2018-11-26 DIAGNOSIS — I10 Essential (primary) hypertension: Secondary | ICD-10-CM | POA: Diagnosis not present

## 2018-11-26 DIAGNOSIS — M4802 Spinal stenosis, cervical region: Secondary | ICD-10-CM | POA: Diagnosis not present

## 2018-11-26 DIAGNOSIS — M450 Ankylosing spondylitis of multiple sites in spine: Secondary | ICD-10-CM | POA: Diagnosis not present

## 2018-11-26 DIAGNOSIS — C187 Malignant neoplasm of sigmoid colon: Secondary | ICD-10-CM | POA: Diagnosis not present

## 2018-11-26 DIAGNOSIS — Z9181 History of falling: Secondary | ICD-10-CM | POA: Diagnosis not present

## 2018-11-26 DIAGNOSIS — M4692 Unspecified inflammatory spondylopathy, cervical region: Secondary | ICD-10-CM | POA: Diagnosis not present

## 2018-11-26 DIAGNOSIS — G894 Chronic pain syndrome: Secondary | ICD-10-CM | POA: Diagnosis not present

## 2018-11-26 DIAGNOSIS — Z7901 Long term (current) use of anticoagulants: Secondary | ICD-10-CM | POA: Diagnosis not present

## 2018-11-26 DIAGNOSIS — K219 Gastro-esophageal reflux disease without esophagitis: Secondary | ICD-10-CM | POA: Diagnosis not present

## 2018-11-26 DIAGNOSIS — Z87440 Personal history of urinary (tract) infections: Secondary | ICD-10-CM | POA: Diagnosis not present

## 2018-11-26 MED ORDER — SODIUM CHLORIDE 0.9 % IV SOLN
1.0000 g | INTRAVENOUS | Status: AC
  Administered 2018-11-27: 1 g via INTRAVENOUS
  Filled 2018-11-26: qty 1

## 2018-11-26 NOTE — Telephone Encounter (Signed)
Patient called and spoke with Georgina Peer. She states patient's pharmacy did not receive bowel prep prescriptions.   The prescriptions were sent in electronically on 11-12-18 to [facility].   I did call in and speak with a pharmacist who took a verbal for-Dulcolax, Miralax, neomycin, erythromycin. They will get prescriptions ready for the patient.

## 2018-11-27 ENCOUNTER — Encounter
Admission: RE | Disposition: A | Discharge status: Home Health | Payer: Self-pay | Source: Home / Self Care | Attending: Surgery

## 2018-11-27 ENCOUNTER — Inpatient Hospital Stay: Payer: Medicare Other

## 2018-11-27 ENCOUNTER — Other Ambulatory Visit: Payer: Self-pay

## 2018-11-27 ENCOUNTER — Inpatient Hospital Stay
Admission: RE | Admit: 2018-11-27 | Discharge: 2018-12-03 | DRG: 330 | Disposition: A | Discharge status: Home Health | Payer: Medicare Other | Attending: Surgery | Admitting: Surgery

## 2018-11-27 DIAGNOSIS — R0902 Hypoxemia: Secondary | ICD-10-CM | POA: Diagnosis not present

## 2018-11-27 DIAGNOSIS — X58XXXD Exposure to other specified factors, subsequent encounter: Secondary | ICD-10-CM | POA: Diagnosis present

## 2018-11-27 DIAGNOSIS — I10 Essential (primary) hypertension: Secondary | ICD-10-CM | POA: Diagnosis present

## 2018-11-27 DIAGNOSIS — Z66 Do not resuscitate: Secondary | ICD-10-CM | POA: Diagnosis not present

## 2018-11-27 DIAGNOSIS — D72829 Elevated white blood cell count, unspecified: Secondary | ICD-10-CM | POA: Diagnosis not present

## 2018-11-27 DIAGNOSIS — S82891D Other fracture of right lower leg, subsequent encounter for closed fracture with routine healing: Secondary | ICD-10-CM

## 2018-11-27 DIAGNOSIS — Z7951 Long term (current) use of inhaled steroids: Secondary | ICD-10-CM | POA: Diagnosis not present

## 2018-11-27 DIAGNOSIS — J45909 Unspecified asthma, uncomplicated: Secondary | ICD-10-CM | POA: Diagnosis present

## 2018-11-27 DIAGNOSIS — E8770 Fluid overload, unspecified: Secondary | ICD-10-CM | POA: Diagnosis not present

## 2018-11-27 DIAGNOSIS — D6489 Other specified anemias: Secondary | ICD-10-CM | POA: Diagnosis not present

## 2018-11-27 DIAGNOSIS — D62 Acute posthemorrhagic anemia: Secondary | ICD-10-CM | POA: Diagnosis not present

## 2018-11-27 DIAGNOSIS — R0600 Dyspnea, unspecified: Secondary | ICD-10-CM

## 2018-11-27 DIAGNOSIS — K219 Gastro-esophageal reflux disease without esophagitis: Secondary | ICD-10-CM | POA: Diagnosis present

## 2018-11-27 DIAGNOSIS — C19 Malignant neoplasm of rectosigmoid junction: Principal | ICD-10-CM | POA: Diagnosis present

## 2018-11-27 DIAGNOSIS — Z9049 Acquired absence of other specified parts of digestive tract: Secondary | ICD-10-CM

## 2018-11-27 DIAGNOSIS — Z87891 Personal history of nicotine dependence: Secondary | ICD-10-CM | POA: Diagnosis not present

## 2018-11-27 DIAGNOSIS — M4802 Spinal stenosis, cervical region: Secondary | ICD-10-CM | POA: Diagnosis present

## 2018-11-27 DIAGNOSIS — F329 Major depressive disorder, single episode, unspecified: Secondary | ICD-10-CM | POA: Diagnosis present

## 2018-11-27 DIAGNOSIS — Z792 Long term (current) use of antibiotics: Secondary | ICD-10-CM | POA: Diagnosis not present

## 2018-11-27 DIAGNOSIS — Z87440 Personal history of urinary (tract) infections: Secondary | ICD-10-CM | POA: Diagnosis not present

## 2018-11-27 DIAGNOSIS — C187 Malignant neoplasm of sigmoid colon: Secondary | ICD-10-CM | POA: Diagnosis not present

## 2018-11-27 HISTORY — PX: ILEOSTOMY: SHX1783

## 2018-11-27 HISTORY — PX: LAPAROSCOPIC SIGMOID COLECTOMY: SHX5928

## 2018-11-27 LAB — CREATININE, SERUM
Creatinine, Ser: 0.4 mg/dL — ABNORMAL LOW (ref 0.44–1.00)
GFR calc Af Amer: >60 mL/min (ref >60)
GFR calc non Af Amer: >60 mL/min (ref >60)

## 2018-11-27 LAB — CBC
HEMATOCRIT: 31.9 % — AB (ref 36.0–46.0)
Hemoglobin: 10 g/dL — ABNORMAL LOW (ref 12.0–15.0)
MCH: 27 pg (ref 26.0–34.0)
MCHC: 31.3 g/dL (ref 30.0–36.0)
MCV: 86 fL (ref 80.0–100.0)
Platelets: 420 10*3/uL — ABNORMAL HIGH (ref 150–400)
RBC: 3.71 MIL/uL — ABNORMAL LOW (ref 3.87–5.11)
RDW: 14.4 % (ref 11.5–15.5)
WBC: 21.4 10*3/uL — ABNORMAL HIGH (ref 4.0–10.5)
nRBC: 0 % (ref 0.0–0.2)

## 2018-11-27 SURGERY — COLECTOMY, SIGMOID, LAPAROSCOPIC
Anesthesia: General | Site: Abdomen | Laterality: Right

## 2018-11-27 MED ORDER — ROCURONIUM BROMIDE 50 MG/5ML IV SOLN
INTRAVENOUS | Status: AC
  Filled 2018-11-27: qty 2

## 2018-11-27 MED ORDER — ONDANSETRON HCL 4 MG/2ML IJ SOLN: 4.0000 mg | Freq: Four times a day (QID) | INTRAMUSCULAR | Status: DC | PRN

## 2018-11-27 MED ORDER — DEXMEDETOMIDINE HCL 200 MCG/2ML IV SOLN
INTRAVENOUS | Status: DC | PRN
  Administered 2018-11-27: 8 ug via INTRAVENOUS
  Administered 2018-11-27: 4 ug via INTRAVENOUS
  Administered 2018-11-27: 8 ug via INTRAVENOUS

## 2018-11-27 MED ORDER — FENTANYL CITRATE (PF) 250 MCG/5ML IJ SOLN
INTRAMUSCULAR | Status: AC
  Filled 2018-11-27: qty 5

## 2018-11-27 MED ORDER — PHENYLEPHRINE HCL 10 MG/ML IJ SOLN
INTRAMUSCULAR | Status: AC
  Filled 2018-11-27: qty 1

## 2018-11-27 MED ORDER — HYDROMORPHONE HCL 1 MG/ML IJ SOLN
INTRAMUSCULAR | Status: AC
  Administered 2018-11-27: 0.25 mg via INTRAVENOUS
  Filled 2018-11-27: qty 1

## 2018-11-27 MED ORDER — LABETALOL HCL 5 MG/ML IV SOLN
INTRAVENOUS | Status: AC
  Filled 2018-11-27: qty 4

## 2018-11-27 MED ORDER — FENTANYL CITRATE (PF) 100 MCG/2ML IJ SOLN
INTRAMUSCULAR | Status: DC | PRN
  Administered 2018-11-27: 100 ug via INTRAVENOUS
  Administered 2018-11-27: 50 ug via INTRAVENOUS
  Administered 2018-11-27: 100 ug via INTRAVENOUS

## 2018-11-27 MED ORDER — TRAZODONE HCL 50 MG PO TABS
50.0000 mg | ORAL_TABLET | Freq: Every day | ORAL | Status: DC
  Administered 2018-11-27 – 2018-12-02 (×6): 50 mg via ORAL
  Filled 2018-11-27 (×6): qty 1

## 2018-11-27 MED ORDER — MONTELUKAST SODIUM 10 MG PO TABS
10.0000 mg | ORAL_TABLET | Freq: Every day | ORAL | Status: DC
  Administered 2018-11-27 – 2018-12-02 (×6): 10 mg via ORAL
  Filled 2018-11-27 (×6): qty 1

## 2018-11-27 MED ORDER — PANTOPRAZOLE SODIUM 40 MG IV SOLR
40.0000 mg | Freq: Every day | INTRAVENOUS | Status: DC
  Administered 2018-11-27 – 2018-11-28 (×2): 40 mg via INTRAVENOUS
  Filled 2018-11-27 (×2): qty 40

## 2018-11-27 MED ORDER — HYDROMORPHONE HCL 1 MG/ML IJ SOLN
INTRAMUSCULAR | Status: AC
  Filled 2018-11-27: qty 1

## 2018-11-27 MED ORDER — METOPROLOL SUCCINATE ER 50 MG PO TB24
50.0000 mg | ORAL_TABLET | Freq: Every day | ORAL | Status: DC
  Administered 2018-11-28: 50 mg via ORAL
  Filled 2018-11-27: qty 1

## 2018-11-27 MED ORDER — PROCHLORPERAZINE MALEATE 10 MG PO TABS
10.0000 mg | ORAL_TABLET | Freq: Four times a day (QID) | ORAL | Status: DC | PRN
  Filled 2018-11-27: qty 1

## 2018-11-27 MED ORDER — EPINEPHRINE PF 1 MG/ML IJ SOLN
INTRAMUSCULAR | Status: AC
  Filled 2018-11-27: qty 1

## 2018-11-27 MED ORDER — BUPIVACAINE LIPOSOME 1.3 % IJ SUSP
INTRAMUSCULAR | Status: DC | PRN
  Administered 2018-11-27: 20 mL

## 2018-11-27 MED ORDER — GABAPENTIN 300 MG PO CAPS
ORAL_CAPSULE | ORAL | Status: AC
  Administered 2018-11-27: 300 mg via ORAL
  Filled 2018-11-27: qty 1

## 2018-11-27 MED ORDER — DEXAMETHASONE SODIUM PHOSPHATE 10 MG/ML IJ SOLN
INTRAMUSCULAR | Status: DC | PRN
  Administered 2018-11-27: 4 mg via INTRAVENOUS

## 2018-11-27 MED ORDER — FAMOTIDINE 20 MG PO TABS
20.0000 mg | ORAL_TABLET | Freq: Once | ORAL | Status: AC
  Administered 2018-11-27: 20 mg via ORAL

## 2018-11-27 MED ORDER — HYDROMORPHONE HCL 1 MG/ML IJ SOLN
0.2500 mg | INTRAMUSCULAR | Status: DC | PRN
  Administered 2018-11-27 (×2): 0.25 mg via INTRAVENOUS

## 2018-11-27 MED ORDER — ACETAMINOPHEN 500 MG PO TABS
ORAL_TABLET | ORAL | Status: AC
  Administered 2018-11-27: 1000 mg via ORAL
  Filled 2018-11-27: qty 2

## 2018-11-27 MED ORDER — EPHEDRINE SULFATE 50 MG/ML IJ SOLN
INTRAMUSCULAR | Status: AC
  Filled 2018-11-27: qty 1

## 2018-11-27 MED ORDER — KETOROLAC TROMETHAMINE 15 MG/ML IJ SOLN
15.0000 mg | Freq: Four times a day (QID) | INTRAMUSCULAR | Status: AC
  Administered 2018-11-27 – 2018-12-02 (×15): 15 mg via INTRAVENOUS
  Filled 2018-11-27 (×17): qty 1

## 2018-11-27 MED ORDER — OXYCODONE HCL 5 MG PO TABS
5.0000 mg | ORAL_TABLET | ORAL | Status: DC | PRN
  Administered 2018-11-28: 5 mg via ORAL
  Administered 2018-11-29 – 2018-12-02 (×8): 10 mg via ORAL
  Administered 2018-12-02 (×2): 5 mg via ORAL
  Administered 2018-12-03: 10 mg via ORAL
  Filled 2018-11-27 (×4): qty 2
  Filled 2018-11-27: qty 1
  Filled 2018-11-27 (×2): qty 2
  Filled 2018-11-27 (×2): qty 1
  Filled 2018-11-27 (×4): qty 2

## 2018-11-27 MED ORDER — GABAPENTIN 400 MG PO CAPS
400.0000 mg | ORAL_CAPSULE | Freq: Three times a day (TID) | ORAL | Status: DC
  Administered 2018-11-27 – 2018-11-29 (×5): 400 mg via ORAL
  Filled 2018-11-27 (×5): qty 1

## 2018-11-27 MED ORDER — HEPARIN SODIUM (PORCINE) 5000 UNIT/ML IJ SOLN
5000.0000 [IU] | Freq: Once | INTRAMUSCULAR | Status: AC
  Administered 2018-11-27: 5000 [IU] via SUBCUTANEOUS

## 2018-11-27 MED ORDER — PHENYLEPHRINE HCL 10 MG/ML IJ SOLN
INTRAMUSCULAR | Status: DC | PRN
  Administered 2018-11-27 (×2): 50 ug via INTRAVENOUS

## 2018-11-27 MED ORDER — PROPOFOL 10 MG/ML IV BOLUS
INTRAVENOUS | Status: AC
  Filled 2018-11-27: qty 20

## 2018-11-27 MED ORDER — DEXAMETHASONE SODIUM PHOSPHATE 4 MG/ML IJ SOLN
INTRAMUSCULAR | Status: AC
  Filled 2018-11-27: qty 1

## 2018-11-27 MED ORDER — MORPHINE SULFATE (PF) 2 MG/ML IV SOLN
2.0000 mg | INTRAVENOUS | Status: DC | PRN
  Administered 2018-11-27 – 2018-12-03 (×8): 2 mg via INTRAVENOUS
  Filled 2018-11-27 (×8): qty 1

## 2018-11-27 MED ORDER — LACTATED RINGERS IV SOLN
INTRAVENOUS | Status: DC
  Administered 2018-11-27 (×3): via INTRAVENOUS

## 2018-11-27 MED ORDER — BUPIVACAINE LIPOSOME 1.3 % IJ SUSP: 20.0000 mL | Freq: Once | INTRAMUSCULAR | Status: DC

## 2018-11-27 MED ORDER — SUCCINYLCHOLINE CHLORIDE 20 MG/ML IJ SOLN
INTRAMUSCULAR | Status: AC
  Filled 2018-11-27: qty 1

## 2018-11-27 MED ORDER — BUPIVACAINE LIPOSOME 1.3 % IJ SUSP
INTRAMUSCULAR | Status: AC
  Filled 2018-11-27: qty 20

## 2018-11-27 MED ORDER — ALBUTEROL SULFATE (2.5 MG/3ML) 0.083% IN NEBU: 2.5000 mg | INHALATION_SOLUTION | Freq: Four times a day (QID) | RESPIRATORY_TRACT | Status: DC | PRN

## 2018-11-27 MED ORDER — KETAMINE HCL 50 MG/ML IJ SOLN
INTRAMUSCULAR | Status: AC
  Filled 2018-11-27: qty 10

## 2018-11-27 MED ORDER — METOPROLOL TARTRATE 5 MG/5ML IV SOLN
2.0000 mg | Freq: Once | INTRAVENOUS | Status: AC
  Administered 2018-11-27: 2 mg via INTRAVENOUS

## 2018-11-27 MED ORDER — TRAMADOL HCL 50 MG PO TABS: 50.0000 mg | ORAL_TABLET | Freq: Four times a day (QID) | ORAL | Status: DC | PRN

## 2018-11-27 MED ORDER — HEPARIN SODIUM (PORCINE) 5000 UNIT/ML IJ SOLN
INTRAMUSCULAR | Status: AC
  Administered 2018-11-27: 5000 [IU] via SUBCUTANEOUS
  Filled 2018-11-27: qty 1

## 2018-11-27 MED ORDER — ONDANSETRON 4 MG PO TBDP: 4.0000 mg | ORAL_TABLET | Freq: Four times a day (QID) | ORAL | Status: DC | PRN

## 2018-11-27 MED ORDER — SUGAMMADEX SODIUM 200 MG/2ML IV SOLN
INTRAVENOUS | Status: DC | PRN
  Administered 2018-11-27: 200 mg via INTRAVENOUS

## 2018-11-27 MED ORDER — HYDRALAZINE HCL 20 MG/ML IJ SOLN
INTRAMUSCULAR | Status: AC
  Filled 2018-11-27: qty 1

## 2018-11-27 MED ORDER — LIDOCAINE HCL (PF) 2 % IJ SOLN
INTRAMUSCULAR | Status: AC
  Filled 2018-11-27: qty 10

## 2018-11-27 MED ORDER — CHLORHEXIDINE GLUCONATE CLOTH 2 % EX PADS: 6.0000 | MEDICATED_PAD | Freq: Once | CUTANEOUS | Status: DC

## 2018-11-27 MED ORDER — SODIUM CHLORIDE FLUSH 0.9 % IV SOLN
INTRAVENOUS | Status: AC
  Filled 2018-11-27: qty 10

## 2018-11-27 MED ORDER — PROCHLORPERAZINE EDISYLATE 10 MG/2ML IJ SOLN
5.0000 mg | Freq: Four times a day (QID) | INTRAMUSCULAR | Status: DC | PRN
  Filled 2018-11-27: qty 2

## 2018-11-27 MED ORDER — CLONAZEPAM 0.5 MG PO TABS
0.5000 mg | ORAL_TABLET | Freq: Three times a day (TID) | ORAL | Status: DC
  Administered 2018-11-27 – 2018-12-03 (×17): 0.5 mg via ORAL
  Filled 2018-11-27 (×17): qty 1

## 2018-11-27 MED ORDER — ONDANSETRON HCL 4 MG/2ML IJ SOLN
INTRAMUSCULAR | Status: DC | PRN
  Administered 2018-11-27: 4 mg via INTRAVENOUS

## 2018-11-27 MED ORDER — BUPIVACAINE-EPINEPHRINE (PF) 0.25% -1:200000 IJ SOLN
INTRAMUSCULAR | Status: DC | PRN
  Administered 2018-11-27: 30 mL

## 2018-11-27 MED ORDER — FAMOTIDINE 20 MG PO TABS
ORAL_TABLET | ORAL | Status: AC
  Administered 2018-11-27: 20 mg via ORAL
  Filled 2018-11-27: qty 1

## 2018-11-27 MED ORDER — METOPROLOL SUCCINATE ER 100 MG PO TB24: 100.0000 mg | ORAL_TABLET | Freq: Every day | ORAL | Status: DC

## 2018-11-27 MED ORDER — HYDROMORPHONE HCL 1 MG/ML IJ SOLN
INTRAMUSCULAR | Status: DC | PRN
  Administered 2018-11-27: .2 mg via INTRAVENOUS
  Administered 2018-11-27: .3 mg via INTRAVENOUS

## 2018-11-27 MED ORDER — GABAPENTIN 300 MG PO CAPS
300.0000 mg | ORAL_CAPSULE | ORAL | Status: AC
  Administered 2018-11-27: 300 mg via ORAL

## 2018-11-27 MED ORDER — PROPOFOL 10 MG/ML IV BOLUS
INTRAVENOUS | Status: DC | PRN
  Administered 2018-11-27: 160 mg via INTRAVENOUS

## 2018-11-27 MED ORDER — SUCCINYLCHOLINE CHLORIDE 20 MG/ML IJ SOLN
INTRAMUSCULAR | Status: DC | PRN
  Administered 2018-11-27: 100 mg via INTRAVENOUS

## 2018-11-27 MED ORDER — BUPIVACAINE HCL (PF) 0.25 % IJ SOLN
INTRAMUSCULAR | Status: AC
  Filled 2018-11-27: qty 30

## 2018-11-27 MED ORDER — ENOXAPARIN SODIUM 40 MG/0.4ML ~~LOC~~ SOLN
40.0000 mg | SUBCUTANEOUS | Status: DC
  Administered 2018-11-28 – 2018-12-03 (×6): 40 mg via SUBCUTANEOUS
  Filled 2018-11-27 (×6): qty 0.4

## 2018-11-27 MED ORDER — LABETALOL HCL 5 MG/ML IV SOLN
INTRAVENOUS | Status: DC | PRN
  Administered 2018-11-27: 10 mg via INTRAVENOUS
  Administered 2018-11-27: 20 mg via INTRAVENOUS
  Administered 2018-11-27: 10 mg via INTRAVENOUS

## 2018-11-27 MED ORDER — ACETAMINOPHEN 500 MG PO TABS
1000.0000 mg | ORAL_TABLET | ORAL | Status: AC
  Administered 2018-11-27: 1000 mg via ORAL

## 2018-11-27 MED ORDER — BUPIVACAINE-EPINEPHRINE (PF) 0.25% -1:200000 IJ SOLN
INTRAMUSCULAR | Status: AC
  Filled 2018-11-27: qty 30

## 2018-11-27 MED ORDER — KETAMINE HCL 10 MG/ML IJ SOLN
INTRAMUSCULAR | Status: DC | PRN
  Administered 2018-11-27 (×2): 10 mg via INTRAVENOUS
  Administered 2018-11-27: 30 mg via INTRAVENOUS

## 2018-11-27 MED ORDER — METOPROLOL TARTRATE 5 MG/5ML IV SOLN
INTRAVENOUS | Status: AC
  Administered 2018-11-27: 2 mg via INTRAVENOUS
  Filled 2018-11-27: qty 5

## 2018-11-27 MED ORDER — DEXTROSE IN LACTATED RINGERS 5 % IV SOLN
INTRAVENOUS | Status: DC
  Administered 2018-11-27 – 2018-11-28 (×2): via INTRAVENOUS

## 2018-11-27 MED ORDER — SODIUM CHLORIDE (PF) 0.9 % IJ SOLN
INTRAMUSCULAR | Status: AC
  Filled 2018-11-27: qty 50

## 2018-11-27 MED ORDER — ROCURONIUM BROMIDE 100 MG/10ML IV SOLN
INTRAVENOUS | Status: DC | PRN
  Administered 2018-11-27: 45 mg via INTRAVENOUS
  Administered 2018-11-27 (×2): 5 mg via INTRAVENOUS
  Administered 2018-11-27 (×3): 10 mg via INTRAVENOUS
  Administered 2018-11-27: 20 mg via INTRAVENOUS

## 2018-11-27 MED ORDER — LIDOCAINE HCL (CARDIAC) PF 100 MG/5ML IV SOSY
PREFILLED_SYRINGE | INTRAVENOUS | Status: DC | PRN
  Administered 2018-11-27: 100 mg via INTRAVENOUS

## 2018-11-27 MED ORDER — ACETAMINOPHEN 500 MG PO TABS
1000.0000 mg | ORAL_TABLET | Freq: Four times a day (QID) | ORAL | Status: DC
  Administered 2018-11-27 – 2018-11-28 (×4): 1000 mg via ORAL
  Filled 2018-11-27 (×9): qty 2

## 2018-11-27 MED ORDER — MIDAZOLAM HCL 2 MG/2ML IJ SOLN
INTRAMUSCULAR | Status: AC
  Filled 2018-11-27: qty 2

## 2018-11-27 MED ORDER — MIDAZOLAM HCL 2 MG/2ML IJ SOLN
INTRAMUSCULAR | Status: DC | PRN
  Administered 2018-11-27: 1 mg via INTRAVENOUS

## 2018-11-27 MED ORDER — HYDRALAZINE HCL 20 MG/ML IJ SOLN
10.0000 mg | INTRAMUSCULAR | Status: DC | PRN
  Administered 2018-11-27: 10 mg via INTRAVENOUS
  Filled 2018-11-27: qty 1

## 2018-11-27 MED ORDER — SODIUM CHLORIDE (PF) 0.9 % IJ SOLN
INTRAMUSCULAR | Status: DC | PRN
  Administered 2018-11-27: 50 mL

## 2018-11-27 MED ORDER — ROCURONIUM BROMIDE 50 MG/5ML IV SOLN
INTRAVENOUS | Status: AC
  Filled 2018-11-27: qty 1

## 2018-11-27 MED ORDER — HYDRALAZINE HCL 20 MG/ML IJ SOLN
INTRAMUSCULAR | Status: DC | PRN
  Administered 2018-11-27: 5 mg via INTRAVENOUS
  Administered 2018-11-27: 10 mg via INTRAVENOUS
  Administered 2018-11-27: 5 mg via INTRAVENOUS

## 2018-11-27 SURGICAL SUPPLY — 83 items
ADH SKN CLS APL DERMABOND .7 (GAUZE/BANDAGES/DRESSINGS) ×4
APPLIER CLIP 13 LRG OPEN (CLIP)
APR CLP LRG 13 20 CLIP (CLIP)
BAG DECANTER FOR FLEXI CONT (MISCELLANEOUS) ×2 IMPLANT
BLADE CLIPPER SURG (BLADE) ×4 IMPLANT
BLADE SURG 15 STRL LF DISP TIS (BLADE) ×2 IMPLANT
BLADE SURG 15 STRL SS (BLADE) ×4
BLADE SURG SZ10 CARB STEEL (BLADE) ×4 IMPLANT
BLADE SURG SZ11 CARB STEEL (BLADE) ×4 IMPLANT
BULB RESERV EVAC DRAIN JP 100C (MISCELLANEOUS) ×2 IMPLANT
CANISTER SUCT 1200ML W/VALVE (MISCELLANEOUS) ×4 IMPLANT
CATH ROBINSON RED 14FR (CATHETERS) ×2 IMPLANT
CATH ROBINSON RED A/P 16FR (CATHETERS) ×2 IMPLANT
CATH URET ROBINSON RED 14FR (CATHETERS)
CHLORAPREP W/TINT 26ML (MISCELLANEOUS) ×4 IMPLANT
CLIP APPLIE 13 LRG OPEN (CLIP) ×2 IMPLANT
COVER WAND RF STERILE (DRAPES) ×4 IMPLANT
DEFOGGER SCOPE WARMER CLEARIFY (MISCELLANEOUS) ×4 IMPLANT
DERMABOND ADVANCED (GAUZE/BANDAGES/DRESSINGS) ×4
DERMABOND ADVANCED .7 DNX12 (GAUZE/BANDAGES/DRESSINGS) ×4 IMPLANT
DRAPE INCISE IOBAN 66X45 STRL (DRAPES) ×4 IMPLANT
DRAPE LEGGINS SURG 28X43 STRL (DRAPES) ×6 IMPLANT
DRAPE UNDER BUTTOCK W/FLU (DRAPES) ×4 IMPLANT
ELECT BLADE 6.5 EXT (BLADE) ×4 IMPLANT
ELECT REM PT RETURN 9FT ADLT (ELECTROSURGICAL) ×4
ELECTRODE REM PT RTRN 9FT ADLT (ELECTROSURGICAL) ×2 IMPLANT
GAUZE SPONGE 4X4 12PLY STRL (GAUZE/BANDAGES/DRESSINGS) ×4 IMPLANT
GLOVE BIO SURGEON STRL SZ7 (GLOVE) ×18 IMPLANT
GOWN STRL REUS W/TWL LRG LVL4 (GOWN DISPOSABLE) ×24 IMPLANT
HANDLE SUCTION POOLE (INSTRUMENTS) ×2 IMPLANT
HANDLE YANKAUER SUCT BULB TIP (MISCELLANEOUS) ×4 IMPLANT
IRRIGATION STRYKERFLOW (MISCELLANEOUS) ×2 IMPLANT
IRRIGATOR STRYKERFLOW (MISCELLANEOUS) ×4
IV NS 1000ML (IV SOLUTION) ×4
IV NS 1000ML BAXH (IV SOLUTION) ×2 IMPLANT
JACKSON PRATT 10 (INSTRUMENTS) ×2 IMPLANT
L-HOOK LAP DISP 36CM (ELECTROSURGICAL)
LHOOK LAP DISP 36CM (ELECTROSURGICAL) ×2 IMPLANT
MARKER SKIN DUAL TIP RULER LAB (MISCELLANEOUS) ×4 IMPLANT
NEEDLE HYPO 22GX1.5 SAFETY (NEEDLE) ×4 IMPLANT
NS IRRIG 1000ML POUR BTL (IV SOLUTION) ×4 IMPLANT
PACK COLON CLEAN CLOSURE (MISCELLANEOUS) ×4 IMPLANT
PACK LAP CHOLECYSTECTOMY (MISCELLANEOUS) ×4 IMPLANT
PENCIL ELECTRO HAND CTR (MISCELLANEOUS) ×4 IMPLANT
RELOAD STAPLE 60 2.6 WHT THN (STAPLE) IMPLANT
RELOAD STAPLE 60 3.6 BLU REG (STAPLE) ×2 IMPLANT
RELOAD STAPLER BLUE 60MM (STAPLE) ×12 IMPLANT
RELOAD STAPLER WHITE 60MM (STAPLE) ×2 IMPLANT
SCISSORS METZENBAUM CVD 33 (INSTRUMENTS) ×4 IMPLANT
SET TUBE SMOKE EVAC HIGH FLOW (TUBING) ×4 IMPLANT
SHEARS HARMONIC ACE PLUS 36CM (ENDOMECHANICALS) ×4 IMPLANT
SLEEVE ENDOPATH XCEL 5M (ENDOMECHANICALS) ×4 IMPLANT
SPONGE LAP 18X18 RF (DISPOSABLE) ×4 IMPLANT
STAPLE ECHEON FLEX 60 POW ENDO (STAPLE) ×2 IMPLANT
STAPLER CIRCULAR 29MM (STAPLE) IMPLANT
STAPLER ENDO ILS CVD 18 33 (STAPLE) IMPLANT
STAPLER PROX 25M (MISCELLANEOUS) IMPLANT
STAPLER RELOAD BLUE 60MM (STAPLE) ×24
STAPLER RELOAD WHITE 60MM (STAPLE) ×4
STAPLER SKIN PROX 35W (STAPLE) ×6 IMPLANT
STAPLER TA28 THK CONTR EEA XL (STAPLE) ×2 IMPLANT
SUCT SIGMOIDOSCOPE TIP 18 W/TU (SUCTIONS) ×4 IMPLANT
SUCTION POOLE HANDLE (INSTRUMENTS) ×4
SUT ETHILON 3-0 FS-10 30 BLK (SUTURE) ×4
SUT MNCRL AB 4-0 PS2 18 (SUTURE) ×8 IMPLANT
SUT PDS AB 0 CT1 27 (SUTURE) ×8 IMPLANT
SUT SILK 2 0 (SUTURE) ×4
SUT SILK 2 0SH CR/8 30 (SUTURE) ×4 IMPLANT
SUT SILK 2-0 (SUTURE) ×4 IMPLANT
SUT SILK 2-0 18XBRD TIE 12 (SUTURE) ×2 IMPLANT
SUT SILK 3-0 (SUTURE) ×4 IMPLANT
SUT VIC AB 2-0 SH 27 (SUTURE) ×8
SUT VIC AB 2-0 SH 27XBRD (SUTURE) ×4 IMPLANT
SUTURE EHLN 3-0 FS-10 30 BLK (SUTURE) IMPLANT
SYR 20CC LL (SYRINGE) ×8 IMPLANT
SYR 50ML LL SCALE MARK (SYRINGE) ×4 IMPLANT
SYRINGE IRR TOOMEY STRL 70CC (SYRINGE) ×4 IMPLANT
SYS LAPSCP GELPORT 120MM (MISCELLANEOUS) ×4
SYSTEM LAPSCP GELPORT 120MM (MISCELLANEOUS) ×2 IMPLANT
TOWEL OR 17X26 4PK STRL BLUE (TOWEL DISPOSABLE) ×2 IMPLANT
TRAY FOLEY MTR SLVR 16FR STAT (SET/KITS/TRAYS/PACK) ×4 IMPLANT
TROCAR XCEL 12X100 BLDLESS (ENDOMECHANICALS) ×4 IMPLANT
TROCAR XCEL NON-BLD 5MMX100MML (ENDOMECHANICALS) ×4 IMPLANT

## 2018-11-27 NOTE — Anesthesia Post-op Follow-up Note (Signed)
Anesthesia QCDR form completed.        

## 2018-11-27 NOTE — Transfer of Care (Signed)
Immediate Anesthesia Transfer of Care Note  Patient: Gloria Allen  Procedure(s) Performed: LAPAROSCOPIC SIGMOID COLECTOMY (N/A Abdomen) ILEOSTOMY (Right Abdomen)  Patient Location: PACU  Anesthesia Type:General  Level of Consciousness: awake  Airway & Oxygen Therapy: Patient Spontanous Breathing and Patient connected to face mask oxygen  Post-op Assessment: Report given to RN and Post -op Vital signs reviewed and stable  Post vital signs: Reviewed  Last Vitals:  Vitals Value Taken Time  BP 153/62 11/27/2018  4:50 PM  Temp    Pulse 80 11/27/2018  4:50 PM  Resp 12 11/27/2018  4:50 PM  SpO2 100 % 11/27/2018  4:50 PM    Last Pain:  Vitals:   11/27/18 0930  TempSrc: Temporal  PainSc: 8          Complications: No apparent anesthesia complications

## 2018-11-27 NOTE — Anesthesia Procedure Notes (Signed)
Procedure Name: Intubation Date/Time: 11/27/2018 10:58 AM Performed by: Durenda Hurt, MD Pre-anesthesia Checklist: Patient identified, Emergency Drugs available, Suction available, Patient being monitored and Timeout performed Patient Re-evaluated:Patient Re-evaluated prior to induction Oxygen Delivery Method: Circle system utilized Preoxygenation: Pre-oxygenation with 100% oxygen Induction Type: IV induction Ventilation: Mask ventilation without difficulty Laryngoscope Size: Mac and 3 Grade View: Grade I Tube type: Oral Tube size: 7.0 mm Number of attempts: 1 Airway Equipment and Method: Stylet Placement Confirmation: ETT inserted through vocal cords under direct vision,  positive ETCO2 and breath sounds checked- equal and bilateral Secured at: 21 cm Tube secured with: Tape Dental Injury: Teeth and Oropharynx as per pre-operative assessment

## 2018-11-27 NOTE — H&P (Signed)
Pt seen and examined. S/P Right ankle ORIF w hardware 2/21. She still has a cast. Case d/w ortho, we would be abl to place her leg in the stirrup. She did have 1/2 wing yesterday around 6pm, last bm this am was clear and she finished her prep around 9-10pm. D/W the pt in detail that ideally she should have avoided that chicken as instructed. D/W her the rational for proceeding vs re-scheduling. I do thin that her risks of leak having that 1/2 wing are not significantly increased, I did explain to the pt that however this is not ideal. She wishes to proceed and not delay her care

## 2018-11-27 NOTE — Op Note (Signed)
PROCEDURES: 1. Laparoscopic lysis of adhesions 2. Hand assisted Laparoscopic ultra low anterior resection 3. Laparoscopic takedown of splenic flexure 4. 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 Rosana Hoes.  Essential for exposure and anastomosis  Anesthesia: General endotracheal anesthesia  ASA Class: 3  Surgeon: Caroleen Hamman , MD FACS  Anesthesia: Gen. with endotracheal tube  Findings: Sigmoid ca extending into mid rectum.  No evidence of anastomotic leak intraop, good perfusion and tension free anastomosis Ultra low rectal anastomosis about 4 cms from anal verge  Poor bowel prep Redundant Transverse colon  Estimated Blood Loss: 100cc         Drains: 15 FR pelvis         Specimens: colon          Complications: none               Condition: stable  Procedure Details  The patient was seen again in the Holding Room. The benefits, complications, treatment options, and expected outcomes were discussed with the patient. The risks of bleeding, infection, recurrence of symptoms, failure to resolve symptoms,  bowel injury, any of which could require further surgery were reviewed with the patient.   The patient was taken to Operating Room, identified as [name] and the procedure verified.  A Time Out was held and the above information confirmed.  Prior to the induction of general anesthesia, antibiotic prophylaxis was administered. VTE prophylaxis was in place. General endotracheal anesthesia was then administered and tolerated well. After the induction, the abdomen was prepped with Chloraprep and draped in the sterile fashion. The patient was positioned in lithotomy position. 7 cm incision was created as a midline mini laparotomy. The abdominal cavity was entered under direct visualization and the GelPort device was placed. A 5 mm port was placed in the  suprapubic area under direct visualization and pneumoperitoneum was obtained. There were dense adhesions from the omentum to the abdominal wall that where lysed in the standard fashion with the Harmonic scalpel. We also were able to place a 12 mm port in the right lower quadrant and a 5 mm port in the left lower quadrant under direct visualization. There was significant adhesive disease in the pelvis from the sigmoid to the pelvic wall and also from the sigmoid to the ovary . These adhesions were lysed with a combination of finger fracturing and Harmonic scalpel. The white line of Toldt  was identified and divided and we mobilized the descending colon IN a lateral to medial fashion. We preserved the ureters at all times. We were also able to mobilize the splenic flexure using Harmonic scalpel in the standard fashion.  Using the Harmonic's scalpel we were able to divide the mesorectum and and also divided proximal to the mesentery of the descending colon. Attention was turned to the sacral promontory  And the avascular place was entered, using the harmonic we divided the mesorectum to complete a total mesorectal excision. We mobilized the distal peritoneum to obtain a better distal excisional margin. We felt the mass and was extending to the mid rectum. We were able to obtain at least a 2 cm distal margin. The rectum was divided with multiple 38mm Exchelon staplers.   We removed the GelPort and visualized the colon in a direct fashion. An divided at the distal transverse colon with standard 60 mm blue load. We divided the T. Colon due  to the significant redundancy. We opened the stopped and measure the diameter of the bowel. . A pursestring was used after in certain the anvil device was inserted. Dr. Rosana Hoes was able to pass a 28 mm standard EEA stapler device through the anus and Under direct visualization we perform an end to end anastomosis with the EEA device. A leak test was performed inflating the colon  with a Toomey syringe and a rubber catheter. No evidence of leak was observed. There was also ad  The drain was sutured in place with a 30 nylon.  Given the poor prep and the ultra low anastomosis as well as her co-morbidities I decided to do a loop ileostomy . An incision was placed RLQ and terminal ileum was brought via the defect to create a loop ileostomy. All the laparoscopic ports were removed and a second look showed no evidence of any bleeding or any other injuries. We changed gloves and place a new tray to close the abdomen with a 0 PDS suture in a running fashion and the skin was closed with staples. Liposomal Marcaine was injected on all incision sites under direct visualization.  The loop ileostomy was matured with multiple interrupted 3-0 vicryls and a red rubber was used to elevate it.    Needle and laparotomy count were correct and there were no immediate complications.  Caroleen Hamman, MD, FACS

## 2018-11-27 NOTE — Anesthesia Preprocedure Evaluation (Addendum)
Anesthesia Evaluation  Patient identified by MRN, date of birth, ID band Patient awake    Reviewed: Allergy & Precautions, H&P , NPO status , Patient's Chart, lab work & pertinent test results  Airway Mallampati: III  TM Distance: >3 FB Neck ROM: limited    Dental  (+) Upper Dentures, Lower Dentures   Pulmonary asthma , former smoker,           Cardiovascular hypertension,      Neuro/Psych  Headaches, PSYCHIATRIC DISORDERS Anxiety Depression  Neuromuscular disease (cervical stenosis)    GI/Hepatic Neg liver ROS, GERD  ,Sigmoid mass   Endo/Other  negative endocrine ROS  Renal/GU CRFRenal disease     Musculoskeletal  (+) Arthritis ,   Abdominal   Peds  Hematology negative hematology ROS (+)   Anesthesia Other Findings Past Medical History: No date: Anxiety No date: Arthritis No date: Asthma 07/30/2017: Cervical central spinal stenosis (C3-C7) (worse at C4-5) 07/30/2017: Cervical foraminal stenosis (C4-5 and C5-6) (Bilateral)  (L>R) 06/11/2017: Chronic lower extremity pain (Fourth Area of Pain)  (Bilateral) (R>L) 06/11/2017: Chronic neck pain (Primary Area of Pain) (Bilateral) (R>L) 06/11/2017: Chronic sacroiliac joint pain (Bilateral) (R>L) 06/11/2017: Chronic shoulder pain (Tertiary Area of Pain) (Right) 06/11/2017: Chronic upper extremity pain (Fifth Area of Pain)  (Bilateral) (R>L) 07/30/2017: DDD (degenerative disc disease), cervical 07/30/2017: DDD (degenerative disc disease), lumbar No date: Depression 07/30/2017: DISH (diffuse idiopathic skeletal hyperostosis) No date: Dyspnea     Comment:  with exertion 06/20/2017: Entrapment syndrome     Comment:  2002 on R and 2010 on L 06/12/2017: Full thickness rotator cuff tear No date: GERD (gastroesophageal reflux disease) 07/30/2017: Grade 1 Anterolisthesis of L4 over L5 No date: Headache No date: Hx: UTI (urinary tract infection) No date:  Hypertension 01/15/2017: Inflammation of joint of shoulder region 07/30/2017: Lumbar central spinal stenosis (L4-5) 07/30/2017: Lumbar disc protrusion (Left: L5-S1) (Right: L1-2)     Comment:  L5-S1 left foraminal protrusion with L5 impingement.               L1-2 right paracentral protrusion without impingement. 07/30/2017: Lumbar facet arthropathy (Bilateral) 07/30/2017: Lumbar facet syndrome (Bilateral) (R>L) 01/15/2017: Osteoarthritis of shoulder (Right)  Past Surgical History: No date: ABDOMINAL HYSTERECTOMY No date: APPENDECTOMY No date: BREAST REDUCTION SURGERY; Bilateral 11/05/2018: COLONOSCOPY WITH PROPOFOL; N/A     Comment:  Procedure: COLONOSCOPY WITH PROPOFOL;  Surgeon:               Virgel Manifold, MD;  Location: [facility] ENDOSCOPY;                Service: Endoscopy;  Laterality: N/A; 11/06/2018: COLONOSCOPY WITH PROPOFOL; N/A     Comment:  Procedure: COLONOSCOPY WITH PROPOFOL;  Surgeon:               Virgel Manifold, MD;  Location: [facility] ENDOSCOPY;                Service: Endoscopy;  Laterality: N/A; No date: DILATION AND CURETTAGE OF UTERUS 11/05/2018: ESOPHAGOGASTRODUODENOSCOPY (EGD) WITH PROPOFOL; N/A     Comment:  Procedure: ESOPHAGOGASTRODUODENOSCOPY (EGD) WITH               PROPOFOL;  Surgeon: Virgel Manifold, MD;  Location:               [facility] ENDOSCOPY;  Service: Endoscopy;  Laterality: N/A; 11/21/2018: ORIF ANKLE FRACTURE; Right     Comment:  Procedure: OPEN REDUCTION INTERNAL FIXATION (ORIF) ANKLE  FRACTURE;  Surgeon: Corky Mull, MD;  Location: [facility]               ORS;  Service: Orthopedics;  Laterality: Right; No date: REDUCTION MAMMAPLASTY 03/26/2017: SHOULDER ARTHROSCOPY WITH OPEN ROTATOR CUFF REPAIR AND  DISTAL CLAVICLE ACROMINECTOMY; Right     Comment:  Procedure: right shoulder arthroscopy, arthroscopic               subacromial decompression, distal clavicle excision, mini              open rotator cuff repair;  Surgeon: Thornton Park, MD;              Location: [facility] ORS;  Service: Orthopedics;  Laterality:               Right;     Reproductive/Obstetrics negative OB ROS                          Anesthesia Physical Anesthesia Plan  ASA: III  Anesthesia Plan: General ETT   Post-op Pain Management:    Induction:   PONV Risk Score and Plan: Ondansetron, Dexamethasone, Midazolam and Treatment may vary due to age or medical condition  Airway Management Planned: Video Laryngoscope Planned  Additional Equipment:   Intra-op Plan:   Post-operative Plan:   Informed Consent: I have reviewed the patients History and Physical, chart, labs and discussed the procedure including the risks, benefits and alternatives for the proposed anesthesia with the patient or authorized representative who has indicated his/her understanding and acceptance.     Dental Advisory Given  Plan Discussed with: Anesthesiologist and CRNA  Anesthesia Plan Comments:        Anesthesia Quick Evaluation

## 2018-11-28 ENCOUNTER — Inpatient Hospital Stay: Payer: Medicare Other

## 2018-11-28 ENCOUNTER — Encounter: Payer: Self-pay | Admitting: Surgery

## 2018-11-28 LAB — BASIC METABOLIC PANEL
Anion gap: 9 (ref 5–15)
BUN: 10 mg/dL (ref 8–23)
CO2: 24 mmol/L (ref 22–32)
Calcium: 8.6 mg/dL — ABNORMAL LOW (ref 8.9–10.3)
Chloride: 103 mmol/L (ref 98–111)
Creatinine, Ser: 0.55 mg/dL (ref 0.44–1.00)
GFR calc Af Amer: >60 mL/min (ref >60)
GFR calc non Af Amer: >60 mL/min (ref >60)
Glucose, Bld: 192 mg/dL — ABNORMAL HIGH (ref 70–99)
Potassium: 4.1 mmol/L (ref 3.5–5.1)
Sodium: 136 mmol/L (ref 135–145)

## 2018-11-28 LAB — CBC
HCT: 30.6 % — ABNORMAL LOW (ref 36.0–46.0)
Hemoglobin: 9.4 g/dL — ABNORMAL LOW (ref 12.0–15.0)
MCH: 26.7 pg (ref 26.0–34.0)
MCHC: 30.7 g/dL (ref 30.0–36.0)
MCV: 86.9 fL (ref 80.0–100.0)
PLATELETS: 437 10*3/uL — AB (ref 150–400)
RBC: 3.52 MIL/uL — AB (ref 3.87–5.11)
RDW: 14.5 % (ref 11.5–15.5)
WBC: 16.4 10*3/uL — ABNORMAL HIGH (ref 4.0–10.5)
nRBC: 0 % (ref 0.0–0.2)

## 2018-11-28 LAB — PHOSPHORUS: Phosphorus: 2.9 mg/dL (ref 2.5–4.6)

## 2018-11-28 LAB — MAGNESIUM: Magnesium: 1.8 mg/dL (ref 1.7–2.4)

## 2018-11-28 MED ORDER — METOPROLOL SUCCINATE ER 50 MG PO TB24
50.0000 mg | ORAL_TABLET | Freq: Every day | ORAL | Status: DC
  Administered 2018-11-29 – 2018-12-03 (×5): 50 mg via ORAL
  Filled 2018-11-28 (×5): qty 1

## 2018-11-28 MED ORDER — SODIUM CHLORIDE 0.9 % IV SOLN: 250.0000 mL | INTRAVENOUS | Status: DC | PRN

## 2018-11-28 MED ORDER — CLONIDINE HCL 0.1 MG PO TABS
0.3000 mg | ORAL_TABLET | Freq: Two times a day (BID) | ORAL | Status: DC
  Administered 2018-11-28 – 2018-12-03 (×9): 0.3 mg via ORAL
  Filled 2018-11-28 (×10): qty 3

## 2018-11-28 MED ORDER — MIRTAZAPINE 15 MG PO TABS
15.0000 mg | ORAL_TABLET | Freq: Every day | ORAL | Status: DC
  Administered 2018-11-28 – 2018-12-02 (×5): 15 mg via ORAL
  Filled 2018-11-28 (×5): qty 1

## 2018-11-28 MED ORDER — SODIUM CHLORIDE 0.9% FLUSH
3.0000 mL | Freq: Two times a day (BID) | INTRAVENOUS | Status: DC
  Administered 2018-11-28 – 2018-12-03 (×11): 3 mL via INTRAVENOUS

## 2018-11-28 MED ORDER — SODIUM CHLORIDE 0.9% FLUSH: 3.0000 mL | INTRAVENOUS | Status: DC | PRN

## 2018-11-28 MED ORDER — GUAIFENESIN 100 MG/5ML PO SOLN
5.0000 mL | ORAL | Status: DC | PRN
  Administered 2018-11-28: 100 mg via ORAL
  Filled 2018-11-28 (×3): qty 5

## 2018-11-28 MED ORDER — CLONIDINE HCL 0.1 MG PO TABS: 0.3000 mg | ORAL_TABLET | Freq: Two times a day (BID) | ORAL | Status: DC

## 2018-11-28 MED ORDER — HYDRALAZINE HCL 50 MG PO TABS: 100.0000 mg | ORAL_TABLET | Freq: Three times a day (TID) | ORAL | Status: DC

## 2018-11-28 MED ORDER — HYDRALAZINE HCL 50 MG PO TABS
50.0000 mg | ORAL_TABLET | Freq: Three times a day (TID) | ORAL | Status: DC
  Administered 2018-11-28 – 2018-11-29 (×4): 50 mg via ORAL
  Filled 2018-11-28 (×4): qty 1

## 2018-11-28 MED ORDER — HYDRALAZINE HCL 50 MG PO TABS: 50.0000 mg | ORAL_TABLET | Freq: Three times a day (TID) | ORAL | Status: DC

## 2018-11-28 MED ORDER — METOPROLOL SUCCINATE ER 25 MG PO TB24: 75.0000 mg | ORAL_TABLET | Freq: Every day | ORAL | Status: DC

## 2018-11-28 MED ORDER — ENSURE ENLIVE PO LIQD
237.0000 mL | Freq: Two times a day (BID) | ORAL | Status: DC
  Administered 2018-11-28 – 2018-12-03 (×10): 237 mL via ORAL

## 2018-11-28 MED ORDER — FUROSEMIDE 10 MG/ML IJ SOLN
20.0000 mg | Freq: Once | INTRAMUSCULAR | Status: AC
  Administered 2018-11-28: 20 mg via INTRAVENOUS
  Filled 2018-11-28: qty 4

## 2018-11-28 MED ORDER — HYDROXYZINE HCL 10 MG PO TABS
10.0000 mg | ORAL_TABLET | Freq: Three times a day (TID) | ORAL | Status: DC | PRN
  Filled 2018-11-28: qty 1

## 2018-11-28 NOTE — Progress Notes (Signed)
PT Cancellation Note  Patient Details Name: Gloria Allen MRN: [id number] DOB: 30-Nov-1945   Cancelled Treatment:    Reason Eval/Treat Not Completed: Medical issues which prohibited therapy(Chart reviewed, RN consulted; BP elevated 199/xx. MD in room asks to defer PT eval until BP is within safer range. )  11:40 AM, 11/28/18 Etta Grandchild, PT, DPT Physical Therapist - [facility]  [phone] ([place])    Buccola,Allan C 11/28/2018, 11:40 AM

## 2018-11-28 NOTE — Consult Note (Addendum)
[facility] [place] NAME: Gloria Allen    MR#:  [id number]  DATE OF BIRTH:  [birth year]  DATE OF ADMISSION:  11/27/2018  PRIMARY CARE PHYSICIAN: [facility] [place]   REQUESTING/REFERRING PHYSICIAN: Dr. Dahlia Byes.  CHIEF COMPLAINT:  No chief complaint on file.  Medical management. HISTORY OF PRESENT ILLNESS:  Gloria Allen  is a 73 y.o. female with a known history of multiple medical problems as below.  The patient is admitted for colon cancer, s/p hand assisted laparoscopic LAR with diverting ileostomy for rectosigmoid colon CA. The patient blood pressure is elevated, so Dr. Dahlia Byes request medical management.  The patient denies any fever or chills but has complains of cough and mild shortness of breath.  She also feels anxious.  Her blood pressure is elevated at 199/97 just now.  She is on IV fluid support after surgery. PAST MEDICAL HISTORY:   Past Medical History:  Diagnosis Date  . Anxiety   . Arthritis   . Asthma   . Cervical central spinal stenosis (C3-C7) (worse at C4-5) 07/30/2017  . Cervical foraminal stenosis (C4-5 and C5-6) (Bilateral) (L>R) 07/30/2017  . Chronic lower extremity pain (Fourth Area of Pain) (Bilateral) (R>L) 06/11/2017  . Chronic neck pain (Primary Area of Pain) (Bilateral) (R>L) 06/11/2017  . Chronic sacroiliac joint pain (Bilateral) (R>L) 06/11/2017  . Chronic shoulder pain [facility] Area of Pain) (Right) 06/11/2017  . Chronic upper extremity pain (Fifth Area of Pain) (Bilateral) (R>L) 06/11/2017  . DDD (degenerative disc disease), cervical 07/30/2017  . DDD (degenerative disc disease), lumbar 07/30/2017  . Depression   . DISH (diffuse idiopathic skeletal hyperostosis) 07/30/2017  . Dyspnea    with exertion  . Entrapment syndrome 06/20/2017   2002 on R and 2010 on L  . Full thickness rotator cuff tear 06/12/2017  . GERD (gastroesophageal reflux disease)   . Grade 1 Anterolisthesis of L4 over L5 07/30/2017   . Headache   . Hx: UTI (urinary tract infection)   . Hypertension   . Inflammation of joint of shoulder region 01/15/2017  . Lumbar central spinal stenosis (L4-5) 07/30/2017  . Lumbar disc protrusion (Left: L5-S1) (Right: L1-2) 07/30/2017   L5-S1 left foraminal protrusion with L5 impingement. L1-2 right paracentral protrusion without impingement.  . Lumbar facet arthropathy (Bilateral) 07/30/2017  . Lumbar facet syndrome (Bilateral) (R>L) 07/30/2017  . Osteoarthritis of shoulder (Right) 01/15/2017    PAST SURGICAL HISTORY:   Past Surgical History:  Procedure Laterality Date  . ABDOMINAL HYSTERECTOMY    . APPENDECTOMY    . BREAST REDUCTION SURGERY Bilateral   . COLONOSCOPY WITH PROPOFOL N/A 11/05/2018   Procedure: COLONOSCOPY WITH PROPOFOL;  Surgeon: Virgel Manifold, MD;  Location: [facility];  Service: Endoscopy;  Laterality: N/A;  . COLONOSCOPY WITH PROPOFOL N/A 11/06/2018   Procedure: COLONOSCOPY WITH PROPOFOL;  Surgeon: Virgel Manifold, MD;  Location: [facility];  Service: Endoscopy;  Laterality: N/A;  . DILATION AND CURETTAGE OF UTERUS    . ESOPHAGOGASTRODUODENOSCOPY (EGD) WITH PROPOFOL N/A 11/05/2018   Procedure: ESOPHAGOGASTRODUODENOSCOPY (EGD) WITH PROPOFOL;  Surgeon: Virgel Manifold, MD;  Location: [facility];  Service: Endoscopy;  Laterality: N/A;  . ILEOSTOMY Right 11/27/2018   Procedure: ILEOSTOMY;  Surgeon: Jules Husbands, MD;  Location: [facility];  Service: General;  Laterality: Right;  . LAPAROSCOPIC SIGMOID COLECTOMY N/A 11/27/2018   Procedure: LAPAROSCOPIC SIGMOID COLECTOMY;  Surgeon: Jules Husbands, MD;  Location: [facility];  Service: General;  Laterality: N/A;  .  ORIF ANKLE FRACTURE Right 11/21/2018   Procedure: OPEN REDUCTION INTERNAL FIXATION (ORIF) ANKLE FRACTURE;  Surgeon: Corky Mull, MD;  Location: [facility];  Service: Orthopedics;  Laterality: Right;  . REDUCTION MAMMAPLASTY    . SHOULDER ARTHROSCOPY WITH OPEN ROTATOR CUFF REPAIR AND DISTAL  CLAVICLE ACROMINECTOMY Right 03/26/2017   Procedure: right shoulder arthroscopy, arthroscopic subacromial decompression, distal clavicle excision, mini open rotator cuff repair;  Surgeon: Thornton Park, MD;  Location: [facility];  Service: Orthopedics;  Laterality: Right;    SOCIAL HISTORY:   Social History   Tobacco Use  . Smoking status: Former Smoker    Packs/day: 1.00    Types: Cigarettes    Last attempt to quit: 2010    Years since quitting: 10.1  . Smokeless tobacco: Never Used  . Tobacco comment: quit over 30 years ago   Substance Use Topics  . Alcohol use: No    FAMILY HISTORY:   Family History  Problem Relation Age of Onset  . Hypertension Mother   . Breast cancer Mother 75  . Breast cancer Sister 34    DRUG ALLERGIES:   Allergies  Allergen Reactions  . Ace Inhibitors Swelling  . Angiotensin Receptor Blockers Swelling  . Sulfa Antibiotics Itching  . Chlorthalidone Rash  . Flexeril [Cyclobenzaprine] Rash    REVIEW OF SYSTEMS:   Review of Systems  Constitutional: Positive for malaise/fatigue. Negative for chills and fever.  HENT: Negative for sore throat.   Eyes: Negative for blurred vision and double vision.  Respiratory: Positive for cough and shortness of breath. Negative for hemoptysis, sputum production, wheezing and stridor.   Cardiovascular: Negative for chest pain, palpitations, orthopnea and leg swelling.  Gastrointestinal: Negative for abdominal pain, blood in stool, diarrhea, melena, nausea and vomiting.  Genitourinary: Negative for dysuria, flank pain and hematuria.  Musculoskeletal: Negative for back pain and joint pain.  Neurological: Negative for dizziness, sensory change, focal weakness, seizures, loss of consciousness, weakness and headaches.  Endo/Heme/Allergies: Negative for polydipsia.  Psychiatric/Behavioral: Negative for depression. The patient is not nervous/anxious.     MEDICATIONS AT HOME:   Prior to Admission medications    Medication Sig Start Date End Date Taking? Authorizing Provider  albuterol (PROVENTIL HFA;VENTOLIN HFA) 108 (90 Base) MCG/ACT inhaler Inhale 2 puffs into the lungs every 6 (six) hours as needed for wheezing or shortness of breath. 04/02/18  Yes Kathrine Haddock, NP  ANORO ELLIPTA 62.5-25 MCG/INH AEPB INHALE 1 PUFF BY MOUTH DAILY Patient taking differently: Inhale 1 puff into the lungs daily.  08/25/18  Yes Johnson, Megan P, DO  bisacodyl (BISACODYL) 5 MG EC tablet Take 4 tablets (5 mg each) at 8 am the day before surgery. 11/12/18  Yes Pabon, Lafferty, MD  BREO ELLIPTA 200-25 MCG/INH AEPB Inhale 1 puff into the lungs daily.  08/11/18  Yes [provider]  clonazePAM (KLONOPIN) 0.5 MG tablet Take 0.5 mg by mouth 3 (three) times daily.    Yes [provider]  cloNIDine (CATAPRES) 0.3 MG tablet Take 2 tablets (0.6 mg total) by mouth 2 (two) times daily. Patient taking differently: Take 0.3 mg by mouth 2 (two) times daily.  05/19/18  Yes Volney American, PA-C  cyclobenzaprine (FLEXERIL) 5 MG tablet Take 5 mg by mouth at bedtime.   Yes [provider]  Erythromycin 500 MG TBEC Take 500 mg by mouth as directed. Take two tablets at 8 am, two tablets at 2 pm, and two tablets at 8 pm the day before surgery. 11/12/18  Yes Pabon, Diego F, MD  hydrALAZINE (APRESOLINE) 100 MG tablet Take 1 tablet (100 mg total) by mouth 3 (three) times daily. 04/22/18  Yes Johnson, Megan P, DO  hydrOXYzine (ATARAX/VISTARIL) 10 MG tablet TAKE 1 TABLET BY MOUTH THREE TIMES A DAY AS NEEDED Patient taking differently: Take 10 mg by mouth 3 (three) times daily as needed for itching.  06/22/18  Yes Johnson, Megan P, DO  meclizine (ANTIVERT) 12.5 MG tablet Take 12.5 mg by mouth 3 (three) times daily as needed for dizziness.  08/20/18  Yes [provider]  methocarbamol (ROBAXIN) 500 MG tablet Take 1 tablet (500 mg total) by mouth at bedtime as needed for muscle spasms. 05/19/18  Yes Volney American, PA-C  metoprolol succinate (TOPROL-XL) 50 MG 24 hr tablet Take 50 mg by mouth daily. Take with or immediately following a meal.   Yes [provider]  mirtazapine (REMERON) 15 MG tablet take 1 tablet by mouth at bedtime for APPETITIE Patient taking differently: Take 15 mg by mouth at bedtime. take 1 tablet by mouth at bedtime for APPETITIE 04/24/18  Yes Johnson, Megan P, DO  montelukast (SINGULAIR) 10 MG tablet Take 1 tablet (10 mg total) by mouth at bedtime as needed. 04/02/18  Yes Kathrine Haddock, NP  neomycin (MYCIFRADIN) 500 MG tablet Take two tablets at 8 am, two tablets at 2 pm, and two tablets at 8 pm the day before surgery. 11/12/18  Yes Pabon, Diego F, MD  nitrofurantoin (MACRODANTIN) 100 MG capsule Take 100 mg by mouth daily.    Yes [provider]  omeprazole (PRILOSEC) 40 MG capsule Take 1 capsule (40 mg total) by mouth 2 (two) times daily. 04/24/18  Yes Johnson, Megan P, DO  oxybutynin (DITROPAN) 5 MG tablet TAKE 1 TABLET BY MOUTH TWICE DAILY Patient taking differently: Take 5 mg by mouth 2 (two) times daily.  08/25/18  Yes Johnson, Megan P, DO  polyethylene glycol powder (GLYCOLAX/MIRALAX) powder 255 grams one bottle for bowel prep 11/12/18  Yes Pabon, Diego F, MD  Potassium 99 MG TABS Take 1 tablet by mouth daily as needed (cramps).    Yes [provider]  sertraline (ZOLOFT) 100 MG tablet Take 2 tablets (200 mg total) by mouth at bedtime. 04/24/18  Yes Johnson, Megan P, DO  traZODone (DESYREL) 50 MG tablet Take 0.5-1 tablets (25-50 mg total) by mouth at bedtime as needed for sleep. Patient taking differently: Take 50 mg by mouth at bedtime.  04/24/18  Yes Johnson, Megan P, DO  triamcinolone cream (KENALOG) 0.1 % APPLY TOPICALLY TWICE DAILY AS NEEDED FOR ITCHING Patient taking differently: Apply 1 application topically 2 (two) times daily as needed (itching).  06/22/18  Yes Johnson, Megan P, DO  enoxaparin (LOVENOX) 40 MG/0.4ML injection Inject 0.4 mLs (40  mg total) into the skin daily. Patient not taking: Reported on 11/25/2018 11/22/18   Reche Dixon, PA-C  hydrALAZINE (APRESOLINE) 50 MG tablet TAKE 1 TABLET BY MOUTH THREE TIMES DAILY Patient not taking: Reported on 11/17/2018 08/25/18   Park Liter P, DO  lovastatin (MEVACOR) 40 MG tablet Take 1 tablet (40 mg total) by mouth at bedtime. 04/24/18   Park Liter P, DO  metoprolol succinate (TOPROL-XL) 100 MG 24 hr tablet TAKE 1 TABLET BY MOUTH DAILY Patient not taking: Reported on 11/17/2018 06/22/18   Park Liter P, DO  terazosin (HYTRIN) 5 MG capsule TAKE 1 CAPSULE BY MOUTH ONCE DAILY AT BEDTIME Patient not taking: Reported on 11/17/2018 06/22/18   Wynetta Emery,  Megan P, DO  traMADol (ULTRAM) 50 MG tablet Take 1 tablet (50 mg total) by mouth every 6 (six) hours. Patient not taking: Reported on 11/27/2018 11/22/18   Reche Dixon, PA-C      VITAL SIGNS:  Blood pressure (!) 166/78, pulse 79, temperature 98.7 F (37.1 C), temperature source Oral, resp. rate 20, height 5\' 5"  (1.651 m), weight 71.6 kg, SpO2 100 %.  PHYSICAL EXAMINATION:  Physical Exam  GENERAL:  73 y.o.-year-old patient lying in the bed with no acute distress.  EYES: Pupils equal, round, reactive to light and accommodation. No scleral icterus. Extraocular muscles intact.  HEENT: Head atraumatic, normocephalic. Oropharynx and nasopharynx clear.  NECK:  Supple, no jugular venous distention. No thyroid enlargement, no tenderness.  LUNGS: Coarse breath sounds bilaterally, no wheezing, rales,rhonchi or crepitation. No use of accessory muscles of respiration.  CARDIOVASCULAR: S1, S2 normal. No murmurs, rubs, or gallops.  ABDOMEN: Soft, tenderness on palpation, nondistended. Bowel sounds present. JP in LLQ with serosanguinous drainage. Ileostomy in RLQ with red rubber catheter in diverting ileostomy. EXTREMITIES: No pedal edema, cyanosis, or clubbing.  NEUROLOGIC: Cranial nerves II through XII are intact. Muscle strength 5/5 in all  extremities. Sensation intact. Gait not checked.  PSYCHIATRIC: The patient is alert and oriented x 3.  SKIN: No obvious rash, lesion, or ulcer.   LABORATORY PANEL:   CBC Recent Labs  Lab 11/28/18 0444  WBC 16.4*  HGB 9.4*  HCT 30.6*  PLT 437*   ------------------------------------------------------------------------------------------------------------------  Chemistries  Recent Labs  Lab 11/28/18 0444  NA 136  K 4.1  CL 103  CO2 24  GLUCOSE 192*  BUN 10  CREATININE 0.55  CALCIUM 8.6*  MG 1.8   ------------------------------------------------------------------------------------------------------------------  Cardiac Enzymes No results for input(s): TROPONINI in the last 168 hours. ------------------------------------------------------------------------------------------------------------------  RADIOLOGY:  No results found.    IMPRESSION AND PLAN:   Hypertension malignancy. Continue Toprol, resume home clonidine and hydralazine p.o., IV hydralazine PRN.  Discontinue IV fluid.  Acute respiratory failure with hypoxia, possible due to fluid overload. Chest x-ray, give Lasix 20 mg IV now.  Robitussin as needed.  Discontinue IV fluid.  Asthma.  Stable.  DuoNeb as needed. Colon cancer, s/p surgery.  Management by surgeon.  Leukocytosis.  Possible due to reaction.  Follow-up CBC.  Anemia.  Possible due to acute blood loss secondary to surgery and IV fluid dilution.  Follow-up hemoglobin.  All the records are reviewed and case discussed with ED provider. Management plans discussed with the patient, daughter and they are in agreement.  CODE STATUS: DNR.  TOTAL TIME TAKING CARE OF THIS PATIENT: 35 minutes.    Demetrios Loll M.D on 11/28/2018 at 11:37 AM  Between 7am to 6pm - Pager - [phone]  After 6pm go to [email or web address] - Technical brewer [facility]  Office  [phone]  CC: Primary care physician; [employer] [employer]   Note: This dictation was prepared with Diplomatic Services operational officer dictation along with smaller Company secretary. Any transcriptional errors that result from this process are unin

## 2018-11-28 NOTE — Progress Notes (Signed)
[place] Hospital Day(s): 1.   Post op day(s): 1 Day Post-Op.   Interval History: Patient seen and examined, no acute events or new complaints overnight. Patient reports that she is feeling pretty good this morning. She reports mild incisional tenderness. No reports of fever, chills, nausea or emesis. Diverting ileostomy appears to be functioning. Tolerating clear liquids  Review of Systems:  Constitutional: denies fever, chills  Gastrointestinal: + abdominal pain (Incisional), denied N/V, or diarrhea/and bowel function as per interval history Integumentary: denies any other rashes or skin discolorations except surgical incisions  Vital signs in last 24 hours: [min-max] current  Temp:  [98.2 F (36.8 C)-99.1 F (37.3 C)] 98.7 F (37.1 C) (02/28 0501) Pulse Rate:  [79-96] 79 (02/28 0501) Resp:  [12-23] 20 (02/28 0501) BP: (153-209)/(62-86) 166/78 (02/28 0800) SpO2:  [92 %-100 %] 100 % (02/28 0501) Weight:  [71.6 kg] 71.6 kg (02/27 2213)     Height: 5\' 5"  (165.1 cm) Weight: 71.6 kg BMI (Calculated): 26.27   Intake/Output this shift:  No intake/output data recorded.    Physical Exam:  Constitutional: alert, cooperative and no distress  Respiratory: breathing non-labored at rest  Cardiovascular: regular rate and sinus rhythm  Gastrointestinal: soft, non-tender, and non-distended, no rebound/guarding. JP in LLQ with serosanguinous drainage. Ileostomy in RLQ with red rubber catheter in diverting ileostomy Integumentary: Laparoscopic and midline incisions are CDI, no erythema or drainage.   Labs:  CBC Latest Ref Rng & Units 11/28/2018 11/27/2018 11/21/2018  WBC 4.0 - 10.5 K/uL 16.4(H) 21.4(H) 12.9(H)  Hemoglobin 12.0 - 15.0 g/dL 9.4(L) 10.0(L) 12.6  Hematocrit 36.0 - 46.0 % 30.6(L) 31.9(L) 40.6  Platelets 150 - 400 K/uL 437(H) 420(H) 389   CMP Latest Ref Rng & Units 11/28/2018 11/27/2018 11/24/2018  Glucose 70 - 99 mg/dL 192(H) - 127(H)   BUN 8 - 23 mg/dL 10 - 10  Creatinine 0.44 - 1.00 mg/dL 0.55 0.40(L) 0.57  Sodium 135 - 145 mmol/L 136 - 135  Potassium 3.5 - 5.1 mmol/L 4.1 - 4.2  Chloride 98 - 111 mmol/L 103 - 100  CO2 22 - 32 mmol/L 24 - 26  Calcium 8.9 - 10.3 mg/dL 8.6(L) - 8.7(L)  Total Protein 6.5 - 8.1 g/dL - - -  Total Bilirubin 0.3 - 1.2 mg/dL - - -  Alkaline Phos 38 - 126 U/L - - -  AST 15 - 41 U/L - - -  ALT 0 - 44 U/L - - -     Imaging studies: No new pertinent imaging studies   Assessment/Plan: (ICD-10's: C18.0) 73 y.o. female who is overall doing well 1 Day Post-Op s/p hand assisted laparoscopic LAR with diverting ileostomy for rectosigmoid colon CA   - Start on full liquids this afternoon, if tolerating advance to soft. Added nutritional supplementation  - Wean IVF  - Pain control prn, antiemetics prn  - Monitor abdominal exam, on-going bowel function, leukocytosis/anemia  - Continue foley 1 more day  - Consult WOC  - Consult Hospitalist for medical management of comorbidities  - Encourage ambulation, PT engaged  - DVT prophylaxis  All of the above findings and recommendations were discussed with the patient, and the medical team, and all of patient's questions were answered to her expressed satisfaction.  -- Edison Simon, PA-C [facility] 11/28/2018, 10:05 AM [phone] M-F: 7am - 4pm

## 2018-11-28 NOTE — Consult Note (Signed)
Gloria Allen Nurse ostomy consult note Stoma type/location: RLQ ileostomy Stomal assessment/size: 1 1/8" pink  Peristomal assessment: intact Treatment options for stomal/peristomal skin: barrier ring Output blood tinged liquid Ostomy pouching: 1pc. Convex with barrier ring  Education provided: Discussed that bowel function will resume and she will begin to have stool in pouch.  Will empty when 1/3 full and change twice weekly and PRN leakage.  Is in pain and no more is discussed today Enrolled patient in [employer] program: No WOC team will follow.  Domenic Moras MSN, RN, FNP-BC [facility]ON Wound, Ostomy, Continence Nurse Pager [phone]

## 2018-11-28 NOTE — Progress Notes (Signed)
Advanced Care Plan.  Purpose of Encounter: CODE STATUS. Parties in Attendance: Patient, her daughter and husband, Therapist, sports and me. Patient's Decisional Capacity: Yes. Medical Story: Gloria Allen  is a 73 y.o. female with a known history of multiple medical problems including anxiety, depression, arthritis, asthma, cervical spine stenosis, hypertension, etc.  The patient is status post surgery for colon cancer.  She has hypertension malignancy.  I discussed with patient's about her current condition, prognosis and CODE STATUS.  The patient does not want to be resuscitated and intubated if she has cardiopulmonary arrest. Plan:  Code Status: DNR Time spent discussing advance care planning: 17 minutes.

## 2018-11-28 NOTE — Evaluation (Signed)
Physical Therapy Evaluation Patient Details Name: Gloria Allen MRN: [id number] DOB: [birth year] Today's Date: 11/28/2018   History of Present Illness  Gloria Allen is a 73 year old female s/p laproscopic colectomy. Pt recently underwent Rt ankle ORIF (11/21/2018) following a fall on ice.  Evaluation held this morning when pt was noted to be HTN outside of safe levels for PT. PMH includes chronic shoulder pain, lumbar disc protrusions and chronic neck pain.  Clinical Impression  Pt admitted with above diagnosis. Pt currently with functional limitations due to the deficits listed below (see "PT Problem List"). Upon entry, pt in bed, no family/caregiver present. The pt is awake and agreeable to participate. The pt is alert and oriented x3, pleasant, conversational, and a good historian. Functional mobility assessment demonstrates increased effort/time requirements, poor tolerance, but no need for physical assistance, whereas the patient performed these at a higher level of independence PTA. AMB trial deferred at this time 2/2 high pain levels. Pt will benefit from skilled PT intervention to increase independence and safety with basic mobility in preparation for discharge to the venue listed below.       Follow Up Recommendations Home health PT    Equipment Recommendations  None recommended by PT    Recommendations for Other Services       Precautions / Restrictions Precautions Precautions: Fall Required Braces or Orthoses: Splint/Cast Splint/Cast: R LL casted following surgery 11/21/2018 Restrictions Weight Bearing Restrictions: Yes RLE Weight Bearing: Non weight bearing      Mobility  Bed Mobility Overal bed mobility: Modified Independent       Supine to sit: Modified independent (Device/Increase time)        Transfers Overall transfer level: Needs assistance Equipment used: Rolling walker (2 wheeled) Transfers: Stand Pivot Transfers   Stand pivot transfers: Supervision        General transfer comment: sock doffed, VC for Lt foot rotational pivot for SPT to chair. Pt asks to avoid hopping 2/2 ABD pain.   Ambulation/Gait                Stairs            Wheelchair Mobility    Modified Rankin (Stroke Patients Only)       Balance Overall balance assessment: Modified Independent;No apparent balance deficits (not formally assessed)                                           Pertinent Vitals/Pain Pain Assessment: 0-10 Pain Score: 10-Worst pain ever Pain Location: Stomach; Pain Descriptors / Indicators: Constant;Aching;Throbbing Pain Intervention(s): Monitored during session    Home Living Family/patient expects to be discharged to:: Private residence Living Arrangements: Alone Available Help at Discharge: Neighbor;Available PRN/intermittently(DTR, SIL have been helping) Type of Home: House Home Access: Stairs to enter Entrance Stairs-Rails: Can reach both Entrance Stairs-Number of Steps: 4 Home Layout: One level Home Equipment: Cane - single point;Walker - 2 wheels      Prior Function Level of Independence: Independent with assistive device(s)         Comments: SPC, RW      Hand Dominance   Dominant Hand: Right    Extremity/Trunk Assessment   Upper Extremity Assessment Upper Extremity Assessment: Overall WFL for tasks assessed    Lower Extremity Assessment Lower Extremity Assessment: Overall WFL for tasks assessed       Communication   Communication:  No difficulties  Cognition Arousal/Alertness: Awake/alert Behavior During Therapy: WFL for tasks assessed/performed Overall Cognitive Status: Within Functional Limits for tasks assessed                                        General Comments      Exercises     Assessment/Plan    PT Assessment Patient needs continued PT services  PT Problem List Decreased strength;Decreased mobility;Decreased balance;Decreased knowledge of  use of DME;Decreased activity tolerance;Pain       PT Treatment Interventions DME instruction;Therapeutic activities;Gait training;Therapeutic exercise;Patient/family education;Stair training;Balance training;Functional mobility training    PT Goals (Current goals can be found in the Care Plan section)  Acute Rehab PT Goals Patient Stated Goal: To get stronger so that she can go home and be independent again. PT Goal Formulation: With patient Time For Goal Achievement: 12/12/18 Potential to Achieve Goals: Good    Frequency Min 2X/week   Barriers to discharge        Co-evaluation               AM-PAC PT "6 Clicks" Mobility  Outcome Measure Help needed turning from your back to your side while in a flat bed without using bedrails?: A Little Help needed moving from lying on your back to sitting on the side of a flat bed without using bedrails?: A Little Help needed moving to and from a bed to a chair (including a wheelchair)?: A Little Help needed standing up from a chair using your arms (e.g., wheelchair or bedside chair)?: A Little Help needed to walk in hospital room?: A Little Help needed climbing 3-5 steps with a railing? : A Little 6 Click Score: 18    End of Session   Activity Tolerance: Patient tolerated treatment well;Patient limited by pain Patient left: in chair;with call bell/phone within reach;with chair alarm set;with nursing/sitter in room Nurse Communication: Mobility status PT Visit Diagnosis: Unsteadiness on feet (R26.81);Muscle weakness (generalized) (M62.81);History of falling (Z91.81);Pain Pain - Right/Left: Right Pain - part of body: Ankle and joints of foot(abdomen)    Time: 1454-1510 PT Time Calculation (min) (ACUTE ONLY): 16 min   Charges:   PT Evaluation $PT Eval Moderate Complexity: 1 Mod          3:26 PM, 11/28/18 Etta Grandchild, PT, DPT Physical Therapist - [facility]  [phone] ([place])      Buccola,Allan C 11/28/2018, 3:25 PM

## 2018-11-29 LAB — BASIC METABOLIC PANEL WITH GFR
Anion gap: 7 (ref 5–15)
BUN: 11 mg/dL (ref 8–23)
CO2: 26 mmol/L (ref 22–32)
Calcium: 8.5 mg/dL — ABNORMAL LOW (ref 8.9–10.3)
Chloride: 104 mmol/L (ref 98–111)
Creatinine, Ser: 0.52 mg/dL (ref 0.44–1.00)
GFR calc Af Amer: >60 mL/min
GFR calc non Af Amer: >60 mL/min
Glucose, Bld: 128 mg/dL — ABNORMAL HIGH (ref 70–99)
Potassium: 3.8 mmol/L (ref 3.5–5.1)
Sodium: 137 mmol/L (ref 135–145)

## 2018-11-29 LAB — CBC
HCT: 27.7 % — ABNORMAL LOW (ref 36.0–46.0)
Hemoglobin: 8.6 g/dL — ABNORMAL LOW (ref 12.0–15.0)
MCH: 26.9 pg (ref 26.0–34.0)
MCHC: 31 g/dL (ref 30.0–36.0)
MCV: 86.6 fL (ref 80.0–100.0)
Platelets: 397 10*3/uL (ref 150–400)
RBC: 3.2 MIL/uL — ABNORMAL LOW (ref 3.87–5.11)
RDW: 14.3 % (ref 11.5–15.5)
WBC: 13.3 10*3/uL — ABNORMAL HIGH (ref 4.0–10.5)
nRBC: 0 % (ref 0.0–0.2)

## 2018-11-29 LAB — HIV ANTIBODY (ROUTINE TESTING W REFLEX): HIV Screen 4th Generation wRfx: NONREACTIVE

## 2018-11-29 MED ORDER — LOPERAMIDE HCL 2 MG PO CAPS
2.0000 mg | ORAL_CAPSULE | ORAL | Status: DC | PRN
  Administered 2018-11-29: 2 mg via ORAL
  Filled 2018-11-29: qty 1

## 2018-11-29 MED ORDER — GABAPENTIN 600 MG PO TABS
600.0000 mg | ORAL_TABLET | Freq: Three times a day (TID) | ORAL | Status: DC
  Administered 2018-11-29 – 2018-12-03 (×13): 600 mg via ORAL
  Filled 2018-11-29 (×13): qty 1

## 2018-11-29 MED ORDER — FLUTICASONE FUROATE-VILANTEROL 200-25 MCG/INH IN AEPB
1.0000 | INHALATION_SPRAY | Freq: Every day | RESPIRATORY_TRACT | Status: DC
  Administered 2018-11-29 – 2018-12-03 (×4): 1 via RESPIRATORY_TRACT
  Filled 2018-11-29: qty 28

## 2018-11-29 MED ORDER — PANTOPRAZOLE SODIUM 40 MG PO TBEC
40.0000 mg | DELAYED_RELEASE_TABLET | Freq: Every day | ORAL | Status: DC
  Administered 2018-11-29 – 2018-12-02 (×4): 40 mg via ORAL
  Filled 2018-11-29 (×4): qty 1

## 2018-11-29 NOTE — Anesthesia Postprocedure Evaluation (Signed)
Anesthesia Post Note  Patient: Gloria Allen  Procedure(s) Performed: LAPAROSCOPIC SIGMOID COLECTOMY (N/A Abdomen) ILEOSTOMY (Right Abdomen)  Patient location during evaluation: PACU Anesthesia Type: General Level of consciousness: awake and alert Pain management: pain level controlled Vital Signs Assessment: post-procedure vital signs reviewed and stable Respiratory status: spontaneous breathing, nonlabored ventilation and respiratory function stable Cardiovascular status: blood pressure returned to baseline and stable Postop Assessment: no apparent nausea or vomiting Anesthetic complications: no     Last Vitals:  Vitals:   11/29/18 0522 11/29/18 0846  BP: (!) 155/59 (!) 166/58  Pulse: 82 (!) 106  Resp: 18   Temp: 37.5 C   SpO2: 97%     Last Pain:  Vitals:   11/29/18 0853  TempSrc:   PainSc: 10-Worst pain ever                 Durenda Hurt

## 2018-11-29 NOTE — Progress Notes (Signed)
[place] at [place] NAME: Mckaylie Vasey    MR#:  [id number]  DATE OF BIRTH:  [birth year]  SUBJECTIVE:   Patient denies any chest pain, palpitations, or shortness of breath.  She endorses some generalized weakness.  No focal weakness.  Blood pressures have improved.  REVIEW OF SYSTEMS:  Review of Systems  Constitutional: Positive for malaise/fatigue. Negative for chills and fever.  HENT: Negative for congestion and sore throat.   Eyes: Negative for blurred vision and double vision.  Respiratory: Negative for cough and shortness of breath.   Cardiovascular: Negative for chest pain and palpitations.  Gastrointestinal: Negative for nausea and vomiting.  Genitourinary: Negative for dysuria and urgency.  Musculoskeletal: Negative for back pain and neck pain.  Neurological: Positive for weakness. Negative for dizziness, focal weakness and headaches.  Psychiatric/Behavioral: Negative for depression. The patient is not nervous/anxious.     DRUG ALLERGIES:   Allergies  Allergen Reactions  . Ace Inhibitors Swelling  . Angiotensin Receptor Blockers Swelling  . Sulfa Antibiotics Itching  . Chlorthalidone Rash  . Flexeril [Cyclobenzaprine] Rash   VITALS:  Blood pressure (!) 155/59, pulse 91, temperature 97.6 F (36.4 C), temperature source Oral, resp. rate 20, height 5\' 5"  (1.651 m), weight 71.6 kg, SpO2 98 %. PHYSICAL EXAMINATION:  Physical Exam  GENERAL:  Laying in the bed with no acute distress.  HEENT: Head atraumatic, normocephalic. Pupils equal, round, reactive to light and accommodation. No scleral icterus. Extraocular muscles intact. Oropharynx and nasopharynx clear.  NECK:  Supple, no jugular venous distention. No thyroid enlargement. LUNGS: Lungs are clear to auscultation bilaterally. No wheezes, crackles, rhonchi. No use of accessory muscles of respiration.  CARDIOVASCULAR: RRR, S1, S2 normal. No murmurs, rubs, or gallops.  ABDOMEN:  Soft, nontender, nondistended. Bowel sounds present. +ileostomy with green/brown stool in bag, no surrounding erythema. EXTREMITIES: No pedal edema, cyanosis, or clubbing.  NEUROLOGIC: CN 2-12 intact, no focal deficits. +global weakness. Sensation intact throughout. Gait not checked.  PSYCHIATRIC: The patient is alert and oriented x 3.  SKIN: No obvious rash, lesion, or ulcer.  LABORATORY PANEL:  Female CBC Recent Labs  Lab 11/29/18 0415  WBC 13.3*  HGB 8.6*  HCT 27.7*  PLT 397   ------------------------------------------------------------------------------------------------------------------ Chemistries  Recent Labs  Lab 11/28/18 0444 11/29/18 0415  NA 136 137  K 4.1 3.8  CL 103 104  CO2 24 26  GLUCOSE 192* 128*  BUN 10 11  CREATININE 0.55 0.52  CALCIUM 8.6* 8.5*  MG 1.8  --    RADIOLOGY:  No results found. ASSESSMENT AND PLAN:   Uncontrolled hypertension- BPs improved from yesterday. -Continue metoprolol, clonidine, hydralazine -IV hydralazine PRN  Acute respiratory failure with hypoxia, possible due to fluid overload- resolved. -Chest x-ray negative -Received Lasix 20 mg IV x1 yesterday, hold off on any additional doses today.  Chronic asthma-stable, no signs of acute exacerbation -Restart home Breo Ellipta -DuoNeb as needed.  Colon cancer, s/p diverting ileostomy. -Management per surgery  Leukocytosis- likely reactive.  No signs of infection. -Follow-up CBC.  Normocytic anemia- likely due to acute blood loss secondary to surgery and IV fluid dilution. -Check anemia panel -Follow-up hemoglobin  Generalized weakness-likely due to deconditioning -PT recommending home health PT  All the records are reviewed and case discussed with Care Management/Social Worker. Management plans discussed with the patient, family and they are in agreement.  CODE STATUS: DNR  TOTAL TIME TAKING CARE OF THIS PATIENT: 35 minutes.   More  than 50% of the time was spent  in counseling/coordination of care: YES   Berna Spare Larico Dimock M.D on 11/29/2018 at 1:22 PM  Between 7am to 6pm - Pager - [phone]  After 6pm go to [email or web address] - Technical brewer [facility]  Office  [phone]  CC: Primary care physician; [employer]  Note: This dictation was prepared with Diplomatic Services operational officer dictation along with smaller Company secretary. Any transcriptional errors that result from this process are unintentional.

## 2018-11-29 NOTE — Progress Notes (Signed)
POD # 2 s/p LAR loop ileostomy Taking full liquids AVSS Hb dropped likely dilutional, JP serous fluid  PE NAD Abd: soft, dressing intact. No infection, loop ileostomy patent w some fluid.no peritonitis  A/p Doing well DC foley Continue current diet Mobilize Appreciate hospitalist help w HTN rx

## 2018-11-29 NOTE — Progress Notes (Signed)
RN notified MD pt is having a lot output from her ileostomy bag. Per Dr. Dahlia Byes okay for RN to order Imodium PRN.

## 2018-11-30 LAB — CBC
HEMATOCRIT: 28.9 % — AB (ref 36.0–46.0)
Hemoglobin: 8.8 g/dL — ABNORMAL LOW (ref 12.0–15.0)
MCH: 26.7 pg (ref 26.0–34.0)
MCHC: 30.4 g/dL (ref 30.0–36.0)
MCV: 87.8 fL (ref 80.0–100.0)
Platelets: 417 10*3/uL — ABNORMAL HIGH (ref 150–400)
RBC: 3.29 MIL/uL — ABNORMAL LOW (ref 3.87–5.11)
RDW: 14.2 % (ref 11.5–15.5)
WBC: 15.9 10*3/uL — ABNORMAL HIGH (ref 4.0–10.5)
nRBC: 0 % (ref 0.0–0.2)

## 2018-11-30 LAB — IRON AND TIBC
Iron: 18 ug/dL — ABNORMAL LOW (ref 28–170)
Saturation Ratios: 8 % — ABNORMAL LOW (ref 10.4–31.8)
TIBC: 216 ug/dL — ABNORMAL LOW (ref 250–450)
UIBC: 198 ug/dL

## 2018-11-30 LAB — FERRITIN: Ferritin: 156 ng/mL (ref 11–307)

## 2018-11-30 MED ORDER — HYDRALAZINE HCL 50 MG PO TABS
75.0000 mg | ORAL_TABLET | Freq: Three times a day (TID) | ORAL | Status: DC
  Administered 2018-11-30 – 2018-12-03 (×8): 75 mg via ORAL
  Filled 2018-11-30 (×9): qty 1

## 2018-11-30 NOTE — Progress Notes (Signed)
POD # 3 Doing better Taking po, no N/V avss Hb stable Good UO Ostomy working  PE NAD Abd: soft, mild appropriate tenderness, no infection ,no peritonitis. ileostomy working and patent.  A/p doing well Mobilize Anticipate DC 24-48hrs Pt lives alone and does not want to stay w daughter She will need home health and ostomy support

## 2018-11-30 NOTE — Care Management Note (Addendum)
Case Management Note  Patient Details  Name: Gloria Allen MRN: [id number] Date of Birth: 10-13-1945  Subjective/Objective:  RNCM consulted on patient to assist with transition of care needs. Patient was recently hospitalized for ankle fracture surgery. SNF was recommended but patient preferred to go home with home health. Patient active with [facility] for skilled nursing and PT. Notified Cheryl of admission. Patient has new osotmy. Being followed in house by [facility] nurse. Patient working towards managing ostomy independently. Patient would like to resume home health with [facility]. Per Malachy Mood they can assist with ostomy management at discharge. Patient was given rolling walker and bedside commode during previous stay. Patient lives alone but states that her daughter can come stay with her if assistance is needed. Patient sees [facility] at [facility] for PCP services. Uses [facility] and denies difficulty obtaining any medications.                        Action/Plan:   Expected Discharge Date:                  Expected Discharge Plan:  [place]  In-House Referral:     Discharge planning Services  CM Consult  Post Acute Care Choice:  Resumption of Svcs/PTA Provider, Home Health Choice offered to:  Patient  DME Arranged:    DME Agency:     HH Arranged:  RN, PT [place] Agency:  [place]  Status of Service:  In process, will continue to follow  If discussed at Long Length of Stay Meetings, dates discussed:    Additional Comments:  Latanya Maudlin, RN 11/30/2018, 12:27 PM

## 2018-11-30 NOTE — Progress Notes (Signed)
[place] at [place] NAME: Gloria Allen    MR#:  [id number]  DATE OF BIRTH:  02-28-1946  SUBJECTIVE:   Patient states she feels okay today. Still having generalized "abdominal soreness". No headaches or chest pain.  REVIEW OF SYSTEMS:  Review of Systems  Constitutional: Positive for malaise/fatigue. Negative for chills and fever.  HENT: Negative for congestion and sore throat.   Eyes: Negative for blurred vision and double vision.  Respiratory: Negative for cough and shortness of breath.   Cardiovascular: Negative for chest pain and palpitations.  Gastrointestinal: Positive for abdominal pain. Negative for nausea and vomiting.  Genitourinary: Negative for dysuria and urgency.  Musculoskeletal: Negative for back pain and neck pain.  Neurological: Positive for weakness. Negative for dizziness, focal weakness and headaches.  Psychiatric/Behavioral: Negative for depression. The patient is not nervous/anxious.     DRUG ALLERGIES:   Allergies  Allergen Reactions  . Ace Inhibitors Swelling  . Angiotensin Receptor Blockers Swelling  . Sulfa Antibiotics Itching  . Chlorthalidone Rash  . Flexeril [Cyclobenzaprine] Rash   VITALS:  Blood pressure (!) 122/51, pulse 63, temperature 98.4 F (36.9 C), temperature source Oral, resp. rate 20, height 5\' 5"  (1.651 m), weight 71.6 kg, SpO2 99 %. PHYSICAL EXAMINATION:  Physical Exam  GENERAL:  Laying in the bed with no acute distress.  HEENT: Head atraumatic, normocephalic. Pupils equal, round, reactive to light and accommodation. No scleral icterus. Extraocular muscles intact. Oropharynx and nasopharynx clear.  NECK:  Supple, no jugular venous distention. No thyroid enlargement. LUNGS: Lungs are clear to auscultation bilaterally. No wheezes, crackles, rhonchi. No use of accessory muscles of respiration.  CARDIOVASCULAR: RRR, S1, S2 normal. No murmurs, rubs, or gallops.  ABDOMEN: Soft, nondistended.  Bowel sounds present. +ileostomy with green/brown stool in bag, no surrounding erythema. +generalized mild tenderness to palpation, no rebound or guarding. EXTREMITIES: No pedal edema, cyanosis, or clubbing.  NEUROLOGIC: CN 2-12 intact, no focal deficits. +global weakness. Sensation intact throughout. Gait not checked.  PSYCHIATRIC: The patient is alert and oriented x 3.  SKIN: No obvious rash, lesion, or ulcer.  LABORATORY PANEL:  Female CBC Recent Labs  Lab 11/30/18 0546  WBC 15.9*  HGB 8.8*  HCT 28.9*  PLT 417*   ------------------------------------------------------------------------------------------------------------------ Chemistries  Recent Labs  Lab 11/28/18 0444 11/29/18 0415  NA 136 137  K 4.1 3.8  CL 103 104  CO2 24 26  GLUCOSE 192* 128*  BUN 10 11  CREATININE 0.55 0.52  CALCIUM 8.6* 8.5*  MG 1.8  --    RADIOLOGY:  No results found. ASSESSMENT AND PLAN:   Uncontrolled hypertension- BPs improved. -Continue metoprolol, clonidine -Increase hydralazine from 50mg  tid to 75mg  tid. Can increase to 100mg  tid if needed. -IV hydralazine PRN  Acute respiratory failure with hypoxia, possible due to fluid overload- resolved. -Monitor  Chronic asthma- stable, no signs of acute exacerbation -Continue home Breo Ellipta -DuoNeb as needed.  Colon cancer, s/p diverting ileostomy. -Management per surgery  Leukocytosis- likely reactive.  No signs of infection. -Follow-up CBC.  Normocytic anemia- likely due to acute blood loss secondary to surgery and IV fluid dilution. -Anemia panel unremarkable -Follow-up hemoglobin  Generalized weakness-likely due to deconditioning -PT recommending home health PT  Hospitalist team will sign off at this time. Please reconsult if additional needs arise.  All the records are reviewed and case discussed with Care Management/Social Worker. Management plans discussed with the patient, family and they are in agreement.  CODE  STATUS: DNR  TOTAL TIME TAKING CARE OF THIS PATIENT: 35 minutes.   More than 50% of the time was spent in counseling/coordination of care: YES   Berna Spare Mayo M.D on 11/30/2018 at 3:43 PM  Between 7am to 6pm - Pager - [phone]  After 6pm go to [email or web address] - Technical brewer [place] [facility]  Office  [phone]  CC: Primary care physician; [employer]  Note: This dictation was prepared with Diplomatic Services operational officer dictation along with smaller Company secretary. Any transcriptional errors that result from this process are unintentional.

## 2018-12-01 LAB — CBC
HCT: 27.7 % — ABNORMAL LOW (ref 36.0–46.0)
Hemoglobin: 8.7 g/dL — ABNORMAL LOW (ref 12.0–15.0)
MCH: 26.8 pg (ref 26.0–34.0)
MCHC: 31.4 g/dL (ref 30.0–36.0)
MCV: 85.2 fL (ref 80.0–100.0)
Platelets: 466 10*3/uL — ABNORMAL HIGH (ref 150–400)
RBC: 3.25 MIL/uL — ABNORMAL LOW (ref 3.87–5.11)
RDW: 13.8 % (ref 11.5–15.5)
WBC: 16.4 10*3/uL — ABNORMAL HIGH (ref 4.0–10.5)
nRBC: 0 % (ref 0.0–0.2)

## 2018-12-01 MED ORDER — LOPERAMIDE HCL 2 MG PO CAPS
2.0000 mg | ORAL_CAPSULE | Freq: Three times a day (TID) | ORAL | Status: DC
  Administered 2018-12-01 – 2018-12-03 (×7): 2 mg via ORAL
  Filled 2018-12-01 (×7): qty 1

## 2018-12-01 NOTE — Care Management Important Message (Signed)
Copy of signed Medicare IM left with patient in room. 

## 2018-12-01 NOTE — Consult Note (Signed)
[place] Nurse ostomy follow up Patient receiving care in [facility]. No family present.   Stoma type/location: Very low, flat (barely budded), pink, moist ileostomy stoma. Suture intact circumferentially.  Support rod in place. Stomal assessment/size: 1 inch top to bottom, 1 3/8 side to side Peristomal assessment: Intact Treatment options for stomal/peristomal skin: Barrier ring used; ostomy belt ordered from Materials for use. Output: Liquid brown effluent  Ostomy pouching: 1pc.convex Kellie Simmering # [phone] for pouch and Kellie Simmering # [phone] for barrier ring). Education provided:  Enrolled patient in [employer] Discharge program: Yes Upon entry into patient's room at 1300 the existing, flat two piece pouch was tight with gas and beginning to pull away.  While the patient was being shown how to burp the pouch, it pulled loose and the pouch contents drained onto the patient and her linens.  The patient stated she had not been letting the air out or emptying the pouch.  I explained she must learn how to do everything I would be showing her in order for her to take care of herself when she leaves the hospital.  The patient was very attentive and participated a great deal in the ostomy care.  Specifically, she performed the following: Burped and emptied the existing pouch; cleaned the tail; removed the pouch using only water. Partially cleaned around the stoma; practiced stretching a barrier ring; observed measuring and cutting a new pouch.  Unfortunately, the stoma is so low on the patient's abdomen that she can't see the entire stoma, and certainly cannot view how to place a barrier ring around it or place the newly cut pouch onto the barrier ring and place it over the stoma properly.  We talked about how she might do this at home and it will involve positioning herself in front of some type of mirror.  She was taught that she will need to change the pouch twice weekly, more often if it leaks, and to empty it when  the feces fills approximately 1/3 of the pouch.  [employer] Ileostomy teaching materials will be provided and she has agreed to enrollment in their secure start program. Primary school teacher, Jenny Reichmann, will reach out and obtain additional [employer] 1 piece flexible 2 1/8 inch pouches Kellie Simmering [phone]). The NT entered the room at the conclusion of the teaching to assist the patient with cleaning and changing bed linens. Thank you for the consult.  Discussed plan of care with the patient and bedside nurse.   Val Riles, RN, MSN, CWOCN, CNS-BC, pager [phone]

## 2018-12-01 NOTE — Progress Notes (Signed)
Pt expressed concern about changing ostomy bag at home due to "soreness and pain when I bend over". Pt educated that some pain is expected due to her recent abdominal surgery. Pt educated on ways to adjust until her soreness is improved. RN suggested using a mirror to change her ostomy in the bathroom. Pt educated that home health does not normally come out to the house every day. Pt verbalizes understanding. Paged wound nurse to speak with her about more education for the patient. Awaiting callback.

## 2018-12-01 NOTE — Progress Notes (Signed)
[place] Hospital Day(s): 4.   Post op day(s): 4 Days Post-Op.   Interval History: Patient seen and examined, no acute events or new complaints overnight. Patient reports no abdominal pain, nausea, or emesis. No fever or chills. Tolerating a diet. Ostomy functioning.   Review of Systems:  Constitutional: denies fever, chills  Gastrointestinal: denies abdominal pain, N/V, or diarrhea/and bowel function as per interval history Integumentary: denies any other rashes or skin discolorations except surgical incisions  Vital signs in last 24 hours: [min-max] current  Temp:  [98.2 F (36.8 C)-98.6 F (37 C)] 98.2 F (36.8 C) (03/02 0543) Pulse Rate:  [63-87] 87 (03/02 0543) Resp:  [18-20] 20 (03/02 0543) BP: (118-177)/(47-67) 177/67 (03/02 0543) SpO2:  [97 %-99 %] 97 % (03/01 2102)     Height: 5\' 5"  (165.1 cm) Weight: 71.6 kg BMI (Calculated): 26.27   Intake/Output this shift:  Total I/O In: 240 [P.O.:240] Out: -    Intake/Output last 2 shifts:  @IOLAST2SHIFTS @   Physical Exam:  Constitutional: alert, cooperative and no distress  Respiratory: breathing non-labored at rest  Cardiovascular: regular rate and sinus rhythm  Gastrointestinal: soft, non-tender, and non-distended, no rebound/guarding. JP in LLQ with serosanguinous drainage. Ileostomy in RLQ with red rubber catheter in diverting ileostomy Integumentary: Laparoscopic and midline incisions are CDI, no erythema or drainage.   Labs:  CBC Latest Ref Rng & Units 12/01/2018 11/30/2018 11/29/2018  WBC 4.0 - 10.5 K/uL 16.4(H) 15.9(H) 13.3(H)  Hemoglobin 12.0 - 15.0 g/dL 8.7(L) 8.8(L) 8.6(L)  Hematocrit 36.0 - 46.0 % 27.7(L) 28.9(L) 27.7(L)  Platelets 150 - 400 K/uL 466(H) 417(H) 397   CMP Latest Ref Rng & Units 11/29/2018 11/28/2018 11/27/2018  Glucose 70 - 99 mg/dL 128(H) 192(H) -  BUN 8 - 23 mg/dL 11 10 -  Creatinine 0.44 - 1.00 mg/dL 0.52 0.55 0.40(L)  Sodium 135 - 145 mmol/L 137 136 -   Potassium 3.5 - 5.1 mmol/L 3.8 4.1 -  Chloride 98 - 111 mmol/L 104 103 -  CO2 22 - 32 mmol/L 26 24 -  Calcium 8.9 - 10.3 mg/dL 8.5(L) 8.6(L) -  Total Protein 6.5 - 8.1 g/dL - - -  Total Bilirubin 0.3 - 1.2 mg/dL - - -  Alkaline Phos 38 - 126 U/L - - -  AST 15 - 41 U/L - - -  ALT 0 - 44 U/L - - -     Imaging studies: No new pertinent imaging studies   Assessment/Plan: (ICD-10's: C18.0) 73 y.o. female with overall doing well still not taking adequate PO 4 Days Post-Op s/p hand assisted laparoscopic LAR with diverting ileostomy for rectosigmoid colon CA   - Continue diet, would like to see improved PO intake today  - Pain control prn, antiemetics prn             - Monitor abdominal exam, on-going bowel function  - Medical management of comorbidities  - Ambulate, PT following (recommending SNF)   - DVT Prophylaxis   All of the above findings and recommendations were discussed with the patient, and the medical team, and all of patient's and family's questions were answered to their expressed satisfaction.  -- Edison Simon, PA-C [facility] 12/01/2018, 10:42 AM [phone] M-F: 7am - 4pm

## 2018-12-01 NOTE — Progress Notes (Signed)
Physical Therapy Treatment Patient Details Name: Gloria Allen MRN: [id number] DOB: [birth year] Today's Date: 12/01/2018    History of Present Illness Gloria Allen is a 73 year old female s/p laproscopic colectomy. Pt recently underwent Rt ankle ORIF (11/21/2018) following a fall on ice.  Evaluation held this morning when pt was noted to be HTN outside of safe levels for PT. PMH includes chronic shoulder pain, lumbar disc protrusions and chronic neck pain.    PT Comments    Pt presents with deficits in strength, transfers, mobility, gait, balance, and activity tolerance.  Pt required extra time and effort with bed mobility tasks but no physical assistance.  Pt required min A for stability and an elevated surface to stand while maintaining RLE NWB status.  Pt was able to take several short hops at the EOB with a RW but with very little LLE clearance and fatigued quickly requiring to return to sitting.  Pt admitted to not being physically able to be fully compliant with RLE NWB status upon recent discharge home.  Pt now significantly weaker functionally and would be unsafe to attempt to return to her prior living situation where she has only intermittent assistance available and 4 steps to enter her home.  Pt will benefit from PT services in a SNF setting upon discharge to safely address above deficits for decreased caregiver assistance and eventual return to PLOF.     Follow Up Recommendations  SNF;Supervision for mobility/OOB     Equipment Recommendations  None recommended by PT    Recommendations for Other Services       Precautions / Restrictions Precautions Precautions: Fall Required Braces or Orthoses: Splint/Cast Splint/Cast: R LL casted following surgery 11/21/2018 Restrictions Weight Bearing Restrictions: Yes RLE Weight Bearing: Non weight bearing    Mobility  Bed Mobility Overal bed mobility: Modified Independent Bed Mobility: Supine to Sit;Sit to Supine     Supine to sit:  Modified independent (Device/Increase time) Sit to supine: Modified independent (Device/Increase time)   General bed mobility comments: Extra time and effort required but no physical assistance needed  Transfers Overall transfer level: Needs assistance Equipment used: Rolling walker (2 wheeled) Transfers: Stand Pivot Transfers Sit to Stand: Min assist;From elevated surface         General transfer comment: Min A to stand from an elevated EOB with min verbal cues for sequenicng to maintain RLE NWB status  Ambulation/Gait Ambulation/Gait assistance: Min assist Gait Distance (Feet): 3 Feet Assistive device: Rolling walker (2 wheeled)   Gait velocity: reduced   General Gait Details: Hop-to pattern with short, effortful hops with minimal clearance   Stairs             Wheelchair Mobility    Modified Rankin (Stroke Patients Only)       Balance Overall balance assessment: Mild deficits observed, not formally tested                                          Cognition Arousal/Alertness: Awake/alert Behavior During Therapy: WFL for tasks assessed/performed Overall Cognitive Status: Within Functional Limits for tasks assessed                                        Exercises Total Joint Exercises Ankle Circles/Pumps: Strengthening;Left;10 reps Quad Sets: Strengthening;Both;10 reps Gluteal  Sets: Strengthening;Both;10 reps Towel Squeeze: Strengthening;Both;10 reps Heel Slides: AROM;Left;10 reps Hip ABduction/ADduction: AROM;Both;5 reps Long Arc Quad: Strengthening;Both;10 reps Knee Flexion: Strengthening;Both;10 reps Other Exercises Other Exercises: HEP for LLE APs and BLE QS, GS, and LAQ x 10 each every 1-2 hours daily    General Comments        Pertinent Vitals/Pain Pain Assessment: 0-10 Pain Score: 7  Pain Location: Stomach, R ankle Pain Descriptors / Indicators: Aching;Sore Pain Intervention(s): Premedicated before  session;Monitored during session    Home Living                      Prior Function            PT Goals (current goals can now be found in the care plan section) Progress towards PT goals: Progressing toward goals    Frequency    Min 2X/week      PT Plan Discharge plan needs to be updated    Co-evaluation              AM-PAC PT "6 Clicks" Mobility   Outcome Measure  Help needed turning from your back to your side while in a flat bed without using bedrails?: A Little Help needed moving from lying on your back to sitting on the side of a flat bed without using bedrails?: A Little Help needed moving to and from a bed to a chair (including a wheelchair)?: A Little Help needed standing up from a chair using your arms (e.g., wheelchair or bedside chair)?: A Little Help needed to walk in hospital room?: A Lot Help needed climbing 3-5 steps with a railing? : Total 6 Click Score: 15    End of Session Equipment Utilized During Treatment: Gait belt;Other (comment)(Under the arms to avoid abdominal area) Activity Tolerance: Patient limited by fatigue;Patient limited by pain Patient left: in bed;with bed alarm set;with call bell/phone within reach;with SCD's reapplied;Other (comment)(SCD to LLE) Nurse Communication: Mobility status PT Visit Diagnosis: Unsteadiness on feet (R26.81);Muscle weakness (generalized) (M62.81);History of falling (Z91.81);Pain Pain - Right/Left: Right Pain - part of body: Ankle and joints of foot(abdominal area)     Time: 6301-6010 PT Time Calculation (min) (ACUTE ONLY): 25 min  Charges:  $Therapeutic Exercise: 23-37 mins                     D. Scott Gloria Allen PT, DPT 12/01/18, 12:27 PM

## 2018-12-02 LAB — CREATININE, SERUM
Creatinine, Ser: 0.58 mg/dL (ref 0.44–1.00)
GFR calc Af Amer: >60 mL/min (ref >60)
GFR calc non Af Amer: >60 mL/min (ref >60)

## 2018-12-02 NOTE — NC FL2 (Signed)
[place] LEVEL OF CARE SCREENING TOOL     IDENTIFICATION  Patient Name: Gloria Allen Birthdate: 1945/12/12 Sex: female Admission Date (Current Location): 11/27/2018  [place] and [place] Number:  Engineering geologist and Address:  [facility], [address] [place], [place] [address]      Provider Number: [id number]  Attending Physician Name and Address:  Jules Husbands, MD  Relative Name and Phone Number:       Current Level of Care: Hospital Recommended Level of Care: [place] Prior Approval Number:    Date Approved/Denied:   PASRR Number: [id number]  Discharge Plan: SNF    Current Diagnoses: Patient Active Problem List   Diagnosis Date Noted  . S/P laparoscopic colectomy 11/27/2018  . Ankylosing spondylitis of multiple sites in spine ([place]) 11/21/2018  . Severe episode of recurrent major depressive disorder, without psychotic features ([place]) 11/21/2018  . Ankle fracture, right 11/21/2018  . Benign neoplasm of ascending colon   . Malignant neoplasm of sigmoid colon ([place])   . Melanosis, colon   . Special screening for malignant neoplasms, colon   . Maculopapular rash   . Colon neoplasm   . Melena   . Columnar epithelial-lined lower esophagus   . Stomach irritation   . IFG (impaired fasting glucose) 04/22/2018  . Ataxia 03/04/2018  . Atypical chest pain 03/04/2018  . SOB (shortness of breath) 01/26/2018  . Laryngopharyngeal reflux (LPR) 08/16/2017  . Suspected exposure to mold 08/05/2017  . Poor historian 08/05/2017  . Lumbar foraminal stenosis (Bilateral: L4-5) (Left: L5-S1) 07/30/2017  . Osteopenia of spine 07/30/2017  . PTSD (post-traumatic stress disorder) 06/20/2017  . Cervical arthritis 06/20/2017  . Bronchial asthma 06/20/2017  . Long term current use of opiate analgesic 06/11/2017  . Chronic pain syndrome 06/11/2017  . Anxiety   . Depression   . GERD (gastroesophageal reflux disease)   .  Benign hypertensive renal disease   . History of rotator cuff repair (Right) 03/26/2017    Orientation RESPIRATION BLADDER Height & Weight     Self, Time, Situation, Place  Normal Continent Weight: 157 lb 13.6 oz (71.6 kg) Height:  5\' 5"  (165.1 cm)  BEHAVIORAL SYMPTOMS/MOOD NEUROLOGICAL BOWEL NUTRITION STATUS  (none) (none) Continent Diet(regular)  AMBULATORY STATUS COMMUNICATION OF NEEDS Skin   Extensive Assist Verbally Surgical wounds, Normal                       Personal Care Assistance Level of Assistance  Bathing, Dressing, Feeding Bathing Assistance: Limited assistance Feeding assistance: Independent Dressing Assistance: Limited assistance     Functional Limitations Info  (no issues)          SPECIAL CARE FACTORS FREQUENCY  PT (By licensed PT)                    Contractures Contractures Info: Not present    Additional Factors Info  Code Status, Allergies Code Status Info: dnr Allergies Info: ace inhibitors, angiotension receptor blockers, flexeril, chlorthalidone           Current Medications (12/02/2018):  This is the current hospital active medication list Current Facility-Administered Medications  Medication Dose Route Frequency Provider Last Rate Last Dose  . 0.9 %  sodium chloride infusion  250 mL Intravenous PRN Demetrios Loll, MD      . acetaminophen (TYLENOL) tablet 1,000 mg  1,000 mg Oral Q6H Pabon, Diego F, MD   1,000 mg at 11/28/18 1811  .  albuterol (PROVENTIL) (2.5 MG/3ML) 0.083% nebulizer solution 2.5 mg  2.5 mg Inhalation Q6H PRN Pabon, Diego F, MD      . clonazePAM (KLONOPIN) tablet 0.5 mg  0.5 mg Oral TID Caroleen Hamman F, MD   0.5 mg at 12/02/18 0946  . cloNIDine (CATAPRES) tablet 0.3 mg  0.3 mg Oral BID Pabon, [place] F, MD   0.3 mg at 12/02/18 0945  . enoxaparin (LOVENOX) injection 40 mg  40 mg Subcutaneous Q24H Pabon, [place] F, MD   40 mg at 12/02/18 0949  . feeding supplement (ENSURE ENLIVE) (ENSURE ENLIVE) liquid 237 mL  237 mL Oral  BID BM Tylene Fantasia, PA-C   237 mL at 12/02/18 0955  . fluticasone furoate-vilanterol (BREO ELLIPTA) 200-25 MCG/INH 1 puff  1 puff Inhalation Daily Mayo, Pete Pelt, MD   1 puff at 12/02/18 [phone]  . gabapentin (NEURONTIN) tablet 600 mg  600 mg Oral TID Caroleen Hamman F, MD   600 mg at 12/02/18 0946  . guaiFENesin (ROBITUSSIN) 100 MG/5ML solution 100 mg  5 mL Oral Q4H PRN Demetrios Loll, MD   100 mg at 11/28/18 1320  . hydrALAZINE (APRESOLINE) injection 10 mg  10 mg Intravenous Q2H PRN Demetrios Loll, MD   10 mg at 11/27/18 2158  . hydrALAZINE (APRESOLINE) tablet 75 mg  75 mg Oral TID Sela Hua, MD   75 mg at 12/02/18 0946  . hydrOXYzine (ATARAX/VISTARIL) tablet 10 mg  10 mg Oral TID PRN Demetrios Loll, MD      . ketorolac (TORADOL) 15 MG/ML injection 15 mg  15 mg Intravenous Q6H Pabon, [place] F, MD   15 mg at 12/02/18 0603  . loperamide (IMODIUM) capsule 2 mg  2 mg Oral TID Caroleen Hamman F, MD   2 mg at 12/02/18 0946  . metoprolol succinate (TOPROL-XL) 24 hr tablet 50 mg  50 mg Oral Daily Demetrios Loll, MD   50 mg at 12/02/18 0946  . mirtazapine (REMERON) tablet 15 mg  15 mg Oral QHS Demetrios Loll, MD   15 mg at 12/01/18 2055  . montelukast (SINGULAIR) tablet 10 mg  10 mg Oral QHS Caroleen Hamman F, MD   10 mg at 12/01/18 2053  . morphine 2 MG/ML injection 2 mg  2 mg Intravenous Q3H PRN Jules Husbands, MD   2 mg at 11/29/18 1850  . ondansetron (ZOFRAN-ODT) disintegrating tablet 4 mg  4 mg Oral Q6H PRN Pabon, Diego F, MD       Or  . ondansetron (ZOFRAN) injection 4 mg  4 mg Intravenous Q6H PRN Pabon, Diego F, MD      . oxyCODONE (Oxy IR/ROXICODONE) immediate release tablet 5-10 mg  5-10 mg Oral Q4H PRN Jules Husbands, MD   10 mg at 12/02/18 0603  . pantoprazole (PROTONIX) EC tablet 40 mg  40 mg Oral QHS Charlett Nose, RPH   40 mg at 12/01/18 2054  . prochlorperazine (COMPAZINE) tablet 10 mg  10 mg Oral Q6H PRN Pabon, Diego F, MD       Or  . prochlorperazine (COMPAZINE) injection 5-10 mg  5-10 mg Intravenous  Q6H PRN Pabon, Diego F, MD      . sodium chloride flush (NS) 0.9 % injection 3 mL  3 mL Intravenous Q12H Demetrios Loll, MD   3 mL at 12/02/18 0950  . sodium chloride flush (NS) 0.9 % injection 3 mL  3 mL Intravenous PRN Demetrios Loll, MD      . traMADol Veatrice Bourbon)  tablet 50 mg  50 mg Oral Q6H PRN Pabon, Diego F, MD      . traZODone (DESYREL) tablet 50 mg  50 mg Oral QHS Caroleen Hamman F, MD   50 mg at 12/01/18 2054     Discharge Medications: Please see discharge summary for a list of discharge medications.  Relevant Imaging Results:  Relevant Lab Results:   Additional Information ss: [id number]  Shela Leff, LCSW

## 2018-12-02 NOTE — Consult Note (Signed)
[place] Nurse ostomy follow up Patient receiving care in [facility].  No family present. The pouch I placed yesterday remains intact.  The patient and I will work together tomorrow morning for her to remove the pouch and place a new one using me only as a Camera operator.  Today she is able to re-attach the ostomy belt using a mirror.  I have reached out to Material Management, Collie Siad, at [phone] to find out if the facility received more of the convex one piece pouches Kellie Simmering [phone]).  She will find out and page me with the answer. Val Riles, RN, MSN, CWOCN, CNS-BC, pager [phone]

## 2018-12-02 NOTE — Clinical Social Work Note (Signed)
Clinical Social Work Assessment  Patient Details  Name: Gloria Allen MRN: [id number] Date of Birth: November 30, 1945  Date of referral:  12/02/18               Reason for consult:  Discharge Planning                Permission sought to share information with:    Permission granted to share information::     Name::        Agency::     Relationship::     Contact Information:     Housing/Transportation Living arrangements for the past 2 months:  Single Family Home Source of Information:  Patient Patient Interpreter Needed:  None Criminal Activity/Legal Involvement Pertinent to Current Situation/Hospitalization:  No - Comment as needed Significant Relationships:  Adult Children Lives with:  Self Do you feel safe going back to the place where you live?  Yes Need for family participation in patient care:  Yes (Comment)  Care giving concerns:  Patient lives at home alone, but has a daughter that lives nearby and does not work.   Social Worker assessment / plan:  CSW has spoken to the patient this afternoon. Patient was recently in the hospital and was discharged to home after refusing skilled rehab placement last week. Patient is aware that PT is recommending this again but she is once again declining. She states that her daughter, who does not work, is able to stay with her for as long as she needs her to. She wishes to resume with [facility].  CSW contacted Malachy Mood with [facility] and she stated that they will need resumption orders at discharge and will continue with services.   Employment status:    Insurance information:    PT Recommendations:    Information / Referral to community resources:     Patient/Family's Response to care:  Patient expressed appreciation for CSW assistance.  Patient/Family's Understanding of and Emotional Response to Diagnosis, Current Treatment, and Prognosis:  Patient states she would feel much better emotionally if she returned home.  Emotional  Assessment Appearance:  Appears stated age Attitude/Demeanor/Rapport:  (pleasant and cooperative) Affect (typically observed):  Calm Orientation:  Oriented to Self, Oriented to Place, Oriented to Situation, Oriented to  Time Alcohol / Substance use:  Not Applicable Psych involvement (Current and /or in the community):  No (Comment)  Discharge Needs  Concerns to be addressed:  Care Coordination Readmission within the last 30 days:  No Current discharge risk:  None Barriers to Discharge:      Shela Leff, LCSW 12/02/2018, 3:51 PM

## 2018-12-02 NOTE — Progress Notes (Signed)
Physical Therapy Treatment Patient Details Name: Gloria Allen MRN: [id number] DOB: Apr 13, 1946 Today's Date: 12/02/2018    History of Present Illness Gloria Allen is a 73 year old female s/p laproscopic colectomy. Pt recently underwent Rt ankle ORIF (11/21/2018) following a fall on ice.  Evaluation held this morning when pt was noted to be HTN outside of safe levels for PT. PMH includes chronic shoulder pain, lumbar disc protrusions and chronic neck pain.    PT Comments    Pt presents with deficits in strength, transfers, mobility, gait, balance, and activity tolerance.  Pt continues to require extra time and effort with bed mobility tasks and min A with transfers along with cues to ensure RLE NWB compliance.  Pt was able to take several small hop-to steps at the EOB with minimal clearance but with RLE NWB status maintained throughout.  Pt will benefit from PT services in a SNF setting upon discharge to safely address above deficits for decreased caregiver assistance and eventual return to PLOF.     Follow Up Recommendations  SNF;Supervision for mobility/OOB     Equipment Recommendations  None recommended by PT    Recommendations for Other Services       Precautions / Restrictions Precautions Precautions: Fall Precaution Comments: Ostomy bag RLQ, JP drain Required Braces or Orthoses: Splint/Cast Splint/Cast: R LL casted following surgery 11/21/2018 Restrictions Weight Bearing Restrictions: Yes RLE Weight Bearing: Non weight bearing    Mobility  Bed Mobility Overal bed mobility: Modified Independent             General bed mobility comments: Extra time and effort required but no physical assistance needed  Transfers Overall transfer level: Needs assistance Equipment used: Rolling walker (2 wheeled) Transfers: Sit to/from Stand Sit to Stand: Min guard;From elevated surface         General transfer comment: Min A to stand from an elevated EOB with min verbal cues for  sequenicng to maintain RLE NWB status  Ambulation/Gait Ambulation/Gait assistance: Min guard Gait Distance (Feet): 3 Feet Assistive device: Rolling walker (2 wheeled)   Gait velocity: reduced   General Gait Details: Hop-to pattern with short, effortful hops with minimal clearance   Stairs             Wheelchair Mobility    Modified Rankin (Stroke Patients Only)       Balance Overall balance assessment: Mild deficits observed, not formally tested                                          Cognition Arousal/Alertness: Awake/alert Behavior During Therapy: WFL for tasks assessed/performed Overall Cognitive Status: Within Functional Limits for tasks assessed                                        Exercises Total Joint Exercises Ankle Circles/Pumps: Strengthening;Left;10 reps Quad Sets: Strengthening;Both;10 reps Gluteal Sets: Strengthening;Both;10 reps Towel Squeeze: Strengthening;Both;10 reps Heel Slides: AROM;Left;10 reps Hip ABduction/ADduction: AROM;Both;5 reps Long Arc Quad: Strengthening;Both;10 reps Knee Flexion: Strengthening;Both;10 reps    General Comments        Pertinent Vitals/Pain Pain Assessment: 0-10 Pain Score: 6  Pain Location: Stomach, R ankle Pain Descriptors / Indicators: Aching;Sore Pain Intervention(s): Premedicated before session;Monitored during session    Home Living  Prior Function            PT Goals (current goals can now be found in the care plan section) Progress towards PT goals: Progressing toward goals    Frequency    Min 2X/week      PT Plan Current plan remains appropriate    Co-evaluation              AM-PAC PT "6 Clicks" Mobility   Outcome Measure  Help needed turning from your back to your side while in a flat bed without using bedrails?: A Little Help needed moving from lying on your back to sitting on the side of a flat bed without  using bedrails?: A Little Help needed moving to and from a bed to a chair (including a wheelchair)?: A Little Help needed standing up from a chair using your arms (e.g., wheelchair or bedside chair)?: A Little Help needed to walk in hospital room?: A Lot Help needed climbing 3-5 steps with a railing? : A Lot 6 Click Score: 16    End of Session Equipment Utilized During Treatment: Gait belt;Other (comment)(Placed under arms to avoid abdominal incisions/ostomy bag/JP drain) Activity Tolerance: Patient tolerated treatment well Patient left: in chair;with chair alarm set;with call bell/phone within reach Nurse Communication: Mobility status;Other (comment)(Pt c/o R heel pain inside cast) PT Visit Diagnosis: Unsteadiness on feet (R26.81);Muscle weakness (generalized) (M62.81);History of falling (Z91.81);Pain Pain - Right/Left: Right Pain - part of body: Ankle and joints of foot     Time: 1320-1345 PT Time Calculation (min) (ACUTE ONLY): 25 min  Charges:  $Therapeutic Exercise: 23-37 mins                     D. Scott Lashara Urey PT, DPT 12/02/18, 2:00 PM

## 2018-12-02 NOTE — Progress Notes (Addendum)
[place] Hospital Day(s): 5.   Post op day(s): 5 Days Post-Op.   Interval History: Patient seen and examined, no acute events or new complaints overnight. Patient reports that she has abdominal soreness near her incision but otherwise denied any fever, chills, nausea, or emesis. She continues to pass gas and stool through diverting ileostomy. She is working with [facility] but does not endorse complete confidence in managing this at home. Per chart review, ostomy outputing 500 ccs in last 12 hours and she endorses emptying this about 3 times a day. Mobilizing well.   Review of Systems:  Constitutional: denies fever, chills  Gastrointestinal: + abdominal pain (incisional), denied N/V, or diarrhea/and bowel function as per interval history Integumentary: denies any other rashes or skin discolorations except surgical incision  Vital signs in last 24 hours: [min-max] current  Temp:  [98.1 F (36.7 C)-99.8 F (37.7 C)] 98.1 F (36.7 C) (03/03 0403) Pulse Rate:  [58-72] 58 (03/03 0403) Resp:  [20] 20 (03/03 0403) BP: (128-135)/(50-55) 128/53 (03/03 0403) SpO2:  [96 %-98 %] 98 % (03/03 0403)     Height: 5\' 5"  (165.1 cm) Weight: 71.6 kg BMI (Calculated): 26.27   Intake/Output this shift:  No intake/output data recorded.     Physical Exam:  Constitutional: alert, cooperative and no distress  Respiratory: breathing non-labored at rest  Cardiovascular: regular rate and sinus rhythm  Gastrointestinal: soft, non-tender, and non-distended, no rebound/guarding. JP in LLQ with minimal serosanguinous drainage. Ileostomy in RLQ with red rubber catheter in diverting ileostomy, gas and stool in bag Integumentary:Laparoscopic and midline incisions are CDI, no erythema or drainage.   Labs:  CBC Latest Ref Rng & Units 12/01/2018 11/30/2018 11/29/2018  WBC 4.0 - 10.5 K/uL 16.4(H) 15.9(H) 13.3(H)  Hemoglobin 12.0 - 15.0 g/dL 8.7(L) 8.8(L) 8.6(L)  Hematocrit 36.0 -  46.0 % 27.7(L) 28.9(L) 27.7(L)  Platelets 150 - 400 K/uL 466(H) 417(H) 397   CMP Latest Ref Rng & Units 12/02/2018 11/29/2018 11/28/2018  Glucose 70 - 99 mg/dL - 128(H) 192(H)  BUN 8 - 23 mg/dL - 11 10  Creatinine 0.44 - 1.00 mg/dL 0.58 0.52 0.55  Sodium 135 - 145 mmol/L - 137 136  Potassium 3.5 - 5.1 mmol/L - 3.8 4.1  Chloride 98 - 111 mmol/L - 104 103  CO2 22 - 32 mmol/L - 26 24  Calcium 8.9 - 10.3 mg/dL - 8.5(L) 8.6(L)  Total Protein 6.5 - 8.1 g/dL - - -  Total Bilirubin 0.3 - 1.2 mg/dL - - -  Alkaline Phos 38 - 126 U/L - - -  AST 15 - 41 U/L - - -  ALT 0 - 44 U/L - - -     Imaging studies: No new pertinent imaging studies   Assessment/Plan: (ICD-10's: C18.0) 73 y.o. female overall doing well working with [facility] for ileostomy management at home 5 Days Post-Op s/p hand assisted laparoscopic LAR with diverting ileostomyfor rectosigmoid colon CA   - Continue diet   - Pain control prn, antiemetics prn - Monitor abdominal exam, on-going bowel function   - Would benefit from 1 more ileostomy teaching to improve confidence prior to going home   - Medical management of comorbidities             - Ambulate, PT following (recommending SNF yesterday)              - DVT Prophylaxis     - Discharge planning: hopefully amenable to discharge tomorrow, will benefit from  further ileostomy teaching  All of the above findings and recommendations were discussed with the patient, and the medical team, and all of patient's questions were answered to their expressed satisfaction.  -- Edison Simon, PA-C [facility] 12/02/2018, 10:20 AM [phone] M-F: 7am - 4pm  I saw and evaluated the patient.  I agree with the above documentation, exam, and plan, which I have edited where appropriate. Fredirick Maudlin  3:41 PM

## 2018-12-03 MED ORDER — OXYCODONE HCL 5 MG PO TABS: 5.0000 mg | ORAL_TABLET | ORAL | 0 refills | Status: DC | PRN

## 2018-12-03 NOTE — Progress Notes (Signed)
Per MD okay for RN to remove JP drain before discharge,.

## 2018-12-03 NOTE — Discharge Summary (Signed)
[facility] SURGICAL DISCHARGE SUMMARY   Patient ID: Gloria Allen MRN: [id number] DOB/AGE: March 02, 1946 73 y.o.  Admit date: 11/27/2018 Discharge date: 12/03/2018  Discharge Diagnoses Patient Active Problem List   Diagnosis Date Noted  . S/P laparoscopic colectomy 11/27/2018  . Ankylosing spondylitis of multiple sites in spine ([place]) 11/21/2018  . Severe episode of recurrent major depressive disorder, without psychotic features ([place]) 11/21/2018  . Ankle fracture, right 11/21/2018  . Benign neoplasm of ascending colon   . Malignant neoplasm of sigmoid colon ([place])   . Melanosis, colon   . Special screening for malignant neoplasms, colon   . Maculopapular rash   . Colon neoplasm   . Melena   . Columnar epithelial-lined lower esophagus   . Stomach irritation   . IFG (impaired fasting glucose) 04/22/2018  . Ataxia 03/04/2018  . Atypical chest pain 03/04/2018  . SOB (shortness of breath) 01/26/2018  . Laryngopharyngeal reflux (LPR) 08/16/2017  . Suspected exposure to mold 08/05/2017  . Poor historian 08/05/2017  . Lumbar foraminal stenosis (Bilateral: L4-5) (Left: L5-S1) 07/30/2017  . Osteopenia of spine 07/30/2017  . PTSD (post-traumatic stress disorder) 06/20/2017  . Cervical arthritis 06/20/2017  . Bronchial asthma 06/20/2017  . Long term current use of opiate analgesic 06/11/2017  . Chronic pain syndrome 06/11/2017  . Anxiety   . Depression   . GERD (gastroesophageal reflux disease)   . Benign hypertensive renal disease   . History of rotator cuff repair (Right) 03/26/2017    Consultants Medicine  Procedures 11/27/2018:  1. Laparoscopic lysis of adhesions 2. Hand assisted Laparoscopic ultra low anterior resection 3. Laparoscopic takedown of splenic flexure 4. Loop ileostomy   HPI: Gloria Allen is a 73 y.o. female with a history of sigmoid colon CA who presents to [facility] on [facility]27 with sigmoid colectomy with Dr Dahlia Byes.   Hospital Course: Informed  consent was obtained and documented, and patient underwent uneventful sigmoid colectomy (Dr Dahlia Byes, 11/27/2018).  Post-operatively, patient's pain improved/resolved and advancement of patient's diet and ambulation were well-tolerated. The remainder of patient's hospital course was essentially unremarkable, and discharge planning was initiated accordingly with patient safely able to be discharged home with appropriate discharge instructions, pain control, and outpatient follow-up after all of her questions were answered to her expressed satisfaction.  Discharge Condition: Good   Physical Examination:  Constitutional: alert, cooperative and no distress  Respiratory: breathing non-labored at rest  Cardiovascular: regular rate and sinus rhythm  Gastrointestinal: soft, non-tender, and non-distended, no rebound/guarding. JP in LLQ with minimal serosanguinous drainage. Ileostomy in RLQ with red rubber catheter in diverting ileostomy, gas and stool in bag Integumentary:Laparoscopic and midline incisions are CDI, no erythema or drainage.   Allergies as of 12/03/2018      Reactions   Ace Inhibitors Swelling   Angiotensin Receptor Blockers Swelling   Sulfa Antibiotics Itching   Chlorthalidone Rash   Flexeril [cyclobenzaprine] Rash      Medication List    TAKE these medications   albuterol 108 (90 Base) MCG/ACT inhaler Commonly known as:  PROVENTIL HFA;VENTOLIN HFA Inhale 2 puffs into the lungs every 6 (six) hours as needed for wheezing or shortness of breath.   ANORO ELLIPTA 62.5-25 MCG/INH Aepb Generic drug:  umeclidinium-vilanterol INHALE 1 PUFF BY MOUTH DAILY What changed:  See the new instructions.   bisacodyl 5 MG EC tablet Commonly known as:  bisacodyl Take 4 tablets (5 mg each) at 8 am the day before surgery.   BREO ELLIPTA 200-25 MCG/INH Aepb Generic  drug:  fluticasone furoate-vilanterol Inhale 1 puff into the lungs daily.   clonazePAM 0.5 MG tablet Commonly known as:   KLONOPIN Take 0.5 mg by mouth 3 (three) times daily.   cloNIDine 0.3 MG tablet Commonly known as:  CATAPRES Take 2 tablets (0.6 mg total) by mouth 2 (two) times daily. What changed:  how much to take   cyclobenzaprine 5 MG tablet Commonly known as:  FLEXERIL Take 5 mg by mouth at bedtime.   enoxaparin 40 MG/0.4ML injection Commonly known as:  LOVENOX Inject 0.4 mLs (40 mg total) into the skin daily.   Erythromycin 500 MG Tbec Take 500 mg by mouth as directed. Take two tablets at 8 am, two tablets at 2 pm, and two tablets at 8 pm the day before surgery.   hydrALAZINE 100 MG tablet Commonly known as:  APRESOLINE Take 1 tablet (100 mg total) by mouth 3 (three) times daily.   hydrALAZINE 50 MG tablet Commonly known as:  APRESOLINE TAKE 1 TABLET BY MOUTH THREE TIMES DAILY   hydrOXYzine 10 MG tablet Commonly known as:  ATARAX/VISTARIL TAKE 1 TABLET BY MOUTH THREE TIMES A DAY AS NEEDED What changed:  reasons to take this   lovastatin 40 MG tablet Commonly known as:  MEVACOR Take 1 tablet (40 mg total) by mouth at bedtime.   meclizine 12.5 MG tablet Commonly known as:  ANTIVERT Take 12.5 mg by mouth 3 (three) times daily as needed for dizziness.   methocarbamol 500 MG tablet Commonly known as:  ROBAXIN Take 1 tablet (500 mg total) by mouth at bedtime as needed for muscle spasms.   metoprolol succinate 50 MG 24 hr tablet Commonly known as:  TOPROL-XL Take 50 mg by mouth daily. Take with or immediately following a meal.   metoprolol succinate 100 MG 24 hr tablet Commonly known as:  TOPROL-XL TAKE 1 TABLET BY MOUTH DAILY   mirtazapine 15 MG tablet Commonly known as:  REMERON take 1 tablet by mouth at bedtime for APPETITIE What changed:    how much to take  how to take this  when to take this   montelukast 10 MG tablet Commonly known as:  SINGULAIR Take 1 tablet (10 mg total) by mouth at bedtime as needed.   neomycin 500 MG tablet Commonly known as:   MYCIFRADIN Take two tablets at 8 am, two tablets at 2 pm, and two tablets at 8 pm the day before surgery.   nitrofurantoin 100 MG capsule Commonly known as:  MACRODANTIN Take 100 mg by mouth daily.   omeprazole 40 MG capsule Commonly known as:  PRILOSEC Take 1 capsule (40 mg total) by mouth 2 (two) times daily.   oxybutynin 5 MG tablet Commonly known as:  DITROPAN TAKE 1 TABLET BY MOUTH TWICE DAILY   oxyCODONE 5 MG immediate release tablet Commonly known as:  Oxy IR/ROXICODONE Take 1-2 tablets (5-10 mg total) by mouth every 4 (four) hours as needed for moderate pain.   polyethylene glycol powder powder Commonly known as:  GLYCOLAX/MIRALAX 255 grams one bottle for bowel prep   Potassium 99 MG Tabs Take 1 tablet by mouth daily as needed (cramps).   sertraline 100 MG tablet Commonly known as:  ZOLOFT Take 2 tablets (200 mg total) by mouth at bedtime.   terazosin 5 MG capsule Commonly known as:  HYTRIN TAKE 1 CAPSULE BY MOUTH ONCE DAILY AT BEDTIME   traMADol 50 MG tablet Commonly known as:  ULTRAM Take 1 tablet (50 mg total) by  mouth every 6 (six) hours.   traZODone 50 MG tablet Commonly known as:  DESYREL Take 0.5-1 tablets (25-50 mg total) by mouth at bedtime as needed for sleep. What changed:    how much to take  when to take this   triamcinolone cream 0.1 % Commonly known as:  KENALOG APPLY TOPICALLY TWICE DAILY AS NEEDED FOR ITCHING What changed:  See the new instructions.        Follow-up Information    [place], [place] F, MD. Schedule an appointment as soon as possible for a visit on 12/10/2018.   Specialty:  General Surgery Why:  s/p sigmoid colectomy Contact information: [address] [place] [address] [phone]            -- Edison Simon , PA-C [facility]  12/03/2018, 1:04 PM [phone] M-F: 7am - 4pm

## 2018-12-03 NOTE — Progress Notes (Addendum)
Gloria Allen  A and O x 4. VSS. Pt tolerating diet well. No complaints of pain or nausea. IV removed intact, prescriptions given. Pt voiced understanding of discharge instructions with no further questions. Pt was able to empty her ileostomy bag and change her own pounch with the wound nurse. Supplies given to pt.  Pt discharged via wheelchair with NT.  Allergies as of 12/03/2018      Reactions   Ace Inhibitors Swelling   Angiotensin Receptor Blockers Swelling   Sulfa Antibiotics Itching   Chlorthalidone Rash   Flexeril [cyclobenzaprine] Rash      Medication List    TAKE these medications   albuterol 108 (90 Base) MCG/ACT inhaler Commonly known as:  PROVENTIL HFA;VENTOLIN HFA Inhale 2 puffs into the lungs every 6 (six) hours as needed for wheezing or shortness of breath.   ANORO ELLIPTA 62.5-25 MCG/INH Aepb Generic drug:  umeclidinium-vilanterol INHALE 1 PUFF BY MOUTH DAILY What changed:  See the new instructions.   bisacodyl 5 MG EC tablet Commonly known as:  bisacodyl Take 4 tablets (5 mg each) at 8 am the day before surgery.   BREO ELLIPTA 200-25 MCG/INH Aepb Generic drug:  fluticasone furoate-vilanterol Inhale 1 puff into the lungs daily.   clonazePAM 0.5 MG tablet Commonly known as:  KLONOPIN Take 0.5 mg by mouth 3 (three) times daily.   cloNIDine 0.3 MG tablet Commonly known as:  CATAPRES Take 2 tablets (0.6 mg total) by mouth 2 (two) times daily. What changed:  how much to take   cyclobenzaprine 5 MG tablet Commonly known as:  FLEXERIL Take 5 mg by mouth at bedtime.   enoxaparin 40 MG/0.4ML injection Commonly known as:  LOVENOX Inject 0.4 mLs (40 mg total) into the skin daily.   Erythromycin 500 MG Tbec Take 500 mg by mouth as directed. Take two tablets at 8 am, two tablets at 2 pm, and two tablets at 8 pm the day before surgery.   hydrALAZINE 100 MG tablet Commonly known as:  APRESOLINE Take 1 tablet (100 mg total) by mouth 3 (three) times daily.    hydrALAZINE 50 MG tablet Commonly known as:  APRESOLINE TAKE 1 TABLET BY MOUTH THREE TIMES DAILY   hydrOXYzine 10 MG tablet Commonly known as:  ATARAX/VISTARIL TAKE 1 TABLET BY MOUTH THREE TIMES A DAY AS NEEDED What changed:  reasons to take this   lovastatin 40 MG tablet Commonly known as:  MEVACOR Take 1 tablet (40 mg total) by mouth at bedtime.   meclizine 12.5 MG tablet Commonly known as:  ANTIVERT Take 12.5 mg by mouth 3 (three) times daily as needed for dizziness.   methocarbamol 500 MG tablet Commonly known as:  ROBAXIN Take 1 tablet (500 mg total) by mouth at bedtime as needed for muscle spasms.   metoprolol succinate 50 MG 24 hr tablet Commonly known as:  TOPROL-XL Take 50 mg by mouth daily. Take with or immediately following a meal.   metoprolol succinate 100 MG 24 hr tablet Commonly known as:  TOPROL-XL TAKE 1 TABLET BY MOUTH DAILY   mirtazapine 15 MG tablet Commonly known as:  REMERON take 1 tablet by mouth at bedtime for APPETITIE What changed:    how much to take  how to take this  when to take this   montelukast 10 MG tablet Commonly known as:  SINGULAIR Take 1 tablet (10 mg total) by mouth at bedtime as needed.   neomycin 500 MG tablet Commonly known as:  MYCIFRADIN Take  two tablets at 8 am, two tablets at 2 pm, and two tablets at 8 pm the day before surgery.   nitrofurantoin 100 MG capsule Commonly known as:  MACRODANTIN Take 100 mg by mouth daily.   omeprazole 40 MG capsule Commonly known as:  PRILOSEC Take 1 capsule (40 mg total) by mouth 2 (two) times daily.   oxybutynin 5 MG tablet Commonly known as:  DITROPAN TAKE 1 TABLET BY MOUTH TWICE DAILY   oxyCODONE 5 MG immediate release tablet Commonly known as:  Oxy IR/ROXICODONE Take 1-2 tablets (5-10 mg total) by mouth every 4 (four) hours as needed for moderate pain.   polyethylene glycol powder powder Commonly known as:  GLYCOLAX/MIRALAX 255 grams one bottle for bowel prep    Potassium 99 MG Tabs Take 1 tablet by mouth daily as needed (cramps).   sertraline 100 MG tablet Commonly known as:  ZOLOFT Take 2 tablets (200 mg total) by mouth at bedtime.   terazosin 5 MG capsule Commonly known as:  HYTRIN TAKE 1 CAPSULE BY MOUTH ONCE DAILY AT BEDTIME   traMADol 50 MG tablet Commonly known as:  ULTRAM Take 1 tablet (50 mg total) by mouth every 6 (six) hours.   traZODone 50 MG tablet Commonly known as:  DESYREL Take 0.5-1 tablets (25-50 mg total) by mouth at bedtime as needed for sleep. What changed:    how much to take  when to take this   triamcinolone cream 0.1 % Commonly known as:  KENALOG APPLY TOPICALLY TWICE DAILY AS NEEDED FOR ITCHING What changed:  See the new instructions.       Vitals:   12/03/18 0853 12/03/18 1221  BP: 135/67 125/69  Pulse: 77 66  Resp: 18 18  Temp:  97.7 F (36.5 C)  SpO2: 99% 100%    Francesco Sor

## 2018-12-03 NOTE — Consult Note (Signed)
[place] Nurse ostomy follow up Patient receiving care in [facility].  No family present.  Patient anxious and tearful, didn't sleep well last night. She is worried that she will not be able to properly do all the necessary ostomy care functions at home.  Although she agreed to read the educational materials I left with her, she told me she hadn't read a thing.   Stoma type/location: RLQ ileostomy Stomal assessment/size: No change Peristomal assessment: Intact. Support rod in place Treatment options for stomal/peristomal skin: barrier ring and convex pouch Output: Brown effluent  Ostomy pouching: 1pc. Convex with ostomy belt.  The pouch placed two days ago held without any leakage.  The stoma remains flat. There is no peristomal skin breakdown.  The following activities the patient performed without any difficulty:  Opening/closing pouch; removal of pouch; cleansing around the stoma.  The following care activities were a real challenge for the patient: initially stated she was supposed to clean around the stoma with alcohol.  She was re-educated to use only water.  The patient had great difficulty tracing the opening pattern onto the new pouch barrier.  Her hand had to be guided to achieve this task.  She cannot actually see the ostomy belt hook, even when I held a hand mirror for her.  Removing the ostomy belt took a lot of effort and many attempts for her to achieve.  Reattaching the ostomy belt was equally as difficult.  She cannot see the stoma, not even with the small hand mirror we had.  So, I had to position the new pouch into place and she was able to secure it to the skin and partially place the tape border against her skin.  A box of convex pouches and several barrier rings, as well as scissors are available for her take home.    She spoke frankly with me that she is realizing she may not be able to stay at home.  She may need to go to a SNF.  I encouraged her to maintain a positive attitude and speak  openly with her [place], doctors, and family should she decide on this option.  Enrolled patient in [place] Start Discharge program: Yes  Val Riles, RN, MSN, [facility], CNS-BC, pager [phone]

## 2018-12-03 NOTE — Clinical Social Work Note (Signed)
Patient discharged today to return home with home health. Malachy Mood with [facility] was notified of patient's discharge and home health resumption orders provided. Shela Leff MSW,LCSW [phone]

## 2018-12-04 LAB — SURGICAL PATHOLOGY

## 2018-12-06 DIAGNOSIS — Z85038 Personal history of other malignant neoplasm of large intestine: Secondary | ICD-10-CM | POA: Diagnosis not present

## 2018-12-06 DIAGNOSIS — J40 Bronchitis, not specified as acute or chronic: Secondary | ICD-10-CM | POA: Diagnosis not present

## 2018-12-06 DIAGNOSIS — Z7901 Long term (current) use of anticoagulants: Secondary | ICD-10-CM | POA: Diagnosis not present

## 2018-12-06 DIAGNOSIS — M450 Ankylosing spondylitis of multiple sites in spine: Secondary | ICD-10-CM | POA: Diagnosis not present

## 2018-12-06 DIAGNOSIS — K219 Gastro-esophageal reflux disease without esophagitis: Secondary | ICD-10-CM | POA: Diagnosis not present

## 2018-12-06 DIAGNOSIS — Z87440 Personal history of urinary (tract) infections: Secondary | ICD-10-CM | POA: Diagnosis not present

## 2018-12-06 DIAGNOSIS — M80062D Age-related osteoporosis with current pathological fracture, left lower leg, subsequent encounter for fracture with routine healing: Secondary | ICD-10-CM | POA: Diagnosis not present

## 2018-12-06 DIAGNOSIS — G894 Chronic pain syndrome: Secondary | ICD-10-CM | POA: Diagnosis not present

## 2018-12-06 DIAGNOSIS — Z483 Aftercare following surgery for neoplasm: Secondary | ICD-10-CM | POA: Diagnosis not present

## 2018-12-06 DIAGNOSIS — M48061 Spinal stenosis, lumbar region without neurogenic claudication: Secondary | ICD-10-CM | POA: Diagnosis not present

## 2018-12-06 DIAGNOSIS — I10 Essential (primary) hypertension: Secondary | ICD-10-CM | POA: Diagnosis not present

## 2018-12-06 DIAGNOSIS — M80071D Age-related osteoporosis with current pathological fracture, right ankle and foot, subsequent encounter for fracture with routine healing: Secondary | ICD-10-CM | POA: Diagnosis not present

## 2018-12-06 DIAGNOSIS — Z433 Encounter for attention to colostomy: Secondary | ICD-10-CM | POA: Diagnosis not present

## 2018-12-06 DIAGNOSIS — Z4801 Encounter for change or removal of surgical wound dressing: Secondary | ICD-10-CM | POA: Diagnosis not present

## 2018-12-06 DIAGNOSIS — M4802 Spinal stenosis, cervical region: Secondary | ICD-10-CM | POA: Diagnosis not present

## 2018-12-06 DIAGNOSIS — M4692 Unspecified inflammatory spondylopathy, cervical region: Secondary | ICD-10-CM | POA: Diagnosis not present

## 2018-12-06 DIAGNOSIS — C187 Malignant neoplasm of sigmoid colon: Secondary | ICD-10-CM | POA: Diagnosis not present

## 2018-12-06 DIAGNOSIS — M7500 Adhesive capsulitis of unspecified shoulder: Secondary | ICD-10-CM | POA: Diagnosis not present

## 2018-12-06 DIAGNOSIS — Z9181 History of falling: Secondary | ICD-10-CM | POA: Diagnosis not present

## 2018-12-08 DIAGNOSIS — Z483 Aftercare following surgery for neoplasm: Secondary | ICD-10-CM | POA: Diagnosis not present

## 2018-12-08 DIAGNOSIS — M80071D Age-related osteoporosis with current pathological fracture, right ankle and foot, subsequent encounter for fracture with routine healing: Secondary | ICD-10-CM | POA: Diagnosis not present

## 2018-12-08 DIAGNOSIS — Z4801 Encounter for change or removal of surgical wound dressing: Secondary | ICD-10-CM | POA: Diagnosis not present

## 2018-12-08 DIAGNOSIS — M80062D Age-related osteoporosis with current pathological fracture, left lower leg, subsequent encounter for fracture with routine healing: Secondary | ICD-10-CM | POA: Diagnosis not present

## 2018-12-08 DIAGNOSIS — G3184 Mild cognitive impairment, so stated: Secondary | ICD-10-CM | POA: Diagnosis not present

## 2018-12-08 DIAGNOSIS — M450 Ankylosing spondylitis of multiple sites in spine: Secondary | ICD-10-CM | POA: Diagnosis not present

## 2018-12-08 DIAGNOSIS — M48061 Spinal stenosis, lumbar region without neurogenic claudication: Secondary | ICD-10-CM | POA: Diagnosis not present

## 2018-12-08 DIAGNOSIS — J40 Bronchitis, not specified as acute or chronic: Secondary | ICD-10-CM | POA: Diagnosis not present

## 2018-12-08 DIAGNOSIS — Z85038 Personal history of other malignant neoplasm of large intestine: Secondary | ICD-10-CM | POA: Diagnosis not present

## 2018-12-08 DIAGNOSIS — M4802 Spinal stenosis, cervical region: Secondary | ICD-10-CM | POA: Diagnosis not present

## 2018-12-08 DIAGNOSIS — Z7901 Long term (current) use of anticoagulants: Secondary | ICD-10-CM | POA: Diagnosis not present

## 2018-12-08 DIAGNOSIS — Z1239 Encounter for other screening for malignant neoplasm of breast: Secondary | ICD-10-CM | POA: Diagnosis not present

## 2018-12-08 DIAGNOSIS — I10 Essential (primary) hypertension: Secondary | ICD-10-CM | POA: Diagnosis not present

## 2018-12-08 DIAGNOSIS — Z9181 History of falling: Secondary | ICD-10-CM | POA: Diagnosis not present

## 2018-12-08 DIAGNOSIS — S82891A Other fracture of right lower leg, initial encounter for closed fracture: Secondary | ICD-10-CM | POA: Diagnosis not present

## 2018-12-08 DIAGNOSIS — C189 Malignant neoplasm of colon, unspecified: Secondary | ICD-10-CM | POA: Diagnosis not present

## 2018-12-08 DIAGNOSIS — M7500 Adhesive capsulitis of unspecified shoulder: Secondary | ICD-10-CM | POA: Diagnosis not present

## 2018-12-08 DIAGNOSIS — G894 Chronic pain syndrome: Secondary | ICD-10-CM | POA: Diagnosis not present

## 2018-12-08 DIAGNOSIS — K219 Gastro-esophageal reflux disease without esophagitis: Secondary | ICD-10-CM | POA: Diagnosis not present

## 2018-12-08 DIAGNOSIS — C187 Malignant neoplasm of sigmoid colon: Secondary | ICD-10-CM | POA: Diagnosis not present

## 2018-12-08 DIAGNOSIS — Z433 Encounter for attention to colostomy: Secondary | ICD-10-CM | POA: Diagnosis not present

## 2018-12-08 DIAGNOSIS — Z87440 Personal history of urinary (tract) infections: Secondary | ICD-10-CM | POA: Diagnosis not present

## 2018-12-08 DIAGNOSIS — M4692 Unspecified inflammatory spondylopathy, cervical region: Secondary | ICD-10-CM | POA: Diagnosis not present

## 2018-12-09 DIAGNOSIS — K219 Gastro-esophageal reflux disease without esophagitis: Secondary | ICD-10-CM | POA: Diagnosis not present

## 2018-12-09 DIAGNOSIS — M4802 Spinal stenosis, cervical region: Secondary | ICD-10-CM | POA: Diagnosis not present

## 2018-12-09 DIAGNOSIS — M80062D Age-related osteoporosis with current pathological fracture, left lower leg, subsequent encounter for fracture with routine healing: Secondary | ICD-10-CM | POA: Diagnosis not present

## 2018-12-09 DIAGNOSIS — M4692 Unspecified inflammatory spondylopathy, cervical region: Secondary | ICD-10-CM | POA: Diagnosis not present

## 2018-12-09 DIAGNOSIS — Z4801 Encounter for change or removal of surgical wound dressing: Secondary | ICD-10-CM | POA: Diagnosis not present

## 2018-12-09 DIAGNOSIS — C187 Malignant neoplasm of sigmoid colon: Secondary | ICD-10-CM | POA: Diagnosis not present

## 2018-12-09 DIAGNOSIS — M7500 Adhesive capsulitis of unspecified shoulder: Secondary | ICD-10-CM | POA: Diagnosis not present

## 2018-12-09 DIAGNOSIS — Z7901 Long term (current) use of anticoagulants: Secondary | ICD-10-CM | POA: Diagnosis not present

## 2018-12-09 DIAGNOSIS — M48061 Spinal stenosis, lumbar region without neurogenic claudication: Secondary | ICD-10-CM | POA: Diagnosis not present

## 2018-12-09 DIAGNOSIS — Z9181 History of falling: Secondary | ICD-10-CM | POA: Diagnosis not present

## 2018-12-09 DIAGNOSIS — M450 Ankylosing spondylitis of multiple sites in spine: Secondary | ICD-10-CM | POA: Diagnosis not present

## 2018-12-09 DIAGNOSIS — Z87440 Personal history of urinary (tract) infections: Secondary | ICD-10-CM | POA: Diagnosis not present

## 2018-12-09 DIAGNOSIS — M80071D Age-related osteoporosis with current pathological fracture, right ankle and foot, subsequent encounter for fracture with routine healing: Secondary | ICD-10-CM | POA: Diagnosis not present

## 2018-12-09 DIAGNOSIS — Z483 Aftercare following surgery for neoplasm: Secondary | ICD-10-CM | POA: Diagnosis not present

## 2018-12-09 DIAGNOSIS — J40 Bronchitis, not specified as acute or chronic: Secondary | ICD-10-CM | POA: Diagnosis not present

## 2018-12-09 DIAGNOSIS — Z433 Encounter for attention to colostomy: Secondary | ICD-10-CM | POA: Diagnosis not present

## 2018-12-09 DIAGNOSIS — G894 Chronic pain syndrome: Secondary | ICD-10-CM | POA: Diagnosis not present

## 2018-12-09 DIAGNOSIS — Z85038 Personal history of other malignant neoplasm of large intestine: Secondary | ICD-10-CM | POA: Diagnosis not present

## 2018-12-09 DIAGNOSIS — I10 Essential (primary) hypertension: Secondary | ICD-10-CM | POA: Diagnosis not present

## 2018-12-10 ENCOUNTER — Encounter: Payer: Self-pay | Admitting: Surgery

## 2018-12-10 ENCOUNTER — Ambulatory Visit (INDEPENDENT_AMBULATORY_CARE_PROVIDER_SITE_OTHER): Payer: Medicare Other | Admitting: Surgery

## 2018-12-10 ENCOUNTER — Other Ambulatory Visit: Payer: Self-pay

## 2018-12-10 VITALS — BP 174/90 | HR 94 | Temp 97.5°F | Resp 16 | Ht 65.0 in | Wt 146.6 lb

## 2018-12-10 DIAGNOSIS — M80062D Age-related osteoporosis with current pathological fracture, left lower leg, subsequent encounter for fracture with routine healing: Secondary | ICD-10-CM | POA: Diagnosis not present

## 2018-12-10 DIAGNOSIS — Z433 Encounter for attention to colostomy: Secondary | ICD-10-CM | POA: Diagnosis not present

## 2018-12-10 DIAGNOSIS — Z87440 Personal history of urinary (tract) infections: Secondary | ICD-10-CM | POA: Diagnosis not present

## 2018-12-10 DIAGNOSIS — K219 Gastro-esophageal reflux disease without esophagitis: Secondary | ICD-10-CM | POA: Diagnosis not present

## 2018-12-10 DIAGNOSIS — M7500 Adhesive capsulitis of unspecified shoulder: Secondary | ICD-10-CM | POA: Diagnosis not present

## 2018-12-10 DIAGNOSIS — I10 Essential (primary) hypertension: Secondary | ICD-10-CM | POA: Diagnosis not present

## 2018-12-10 DIAGNOSIS — M80071D Age-related osteoporosis with current pathological fracture, right ankle and foot, subsequent encounter for fracture with routine healing: Secondary | ICD-10-CM | POA: Diagnosis not present

## 2018-12-10 DIAGNOSIS — G894 Chronic pain syndrome: Secondary | ICD-10-CM | POA: Diagnosis not present

## 2018-12-10 DIAGNOSIS — M450 Ankylosing spondylitis of multiple sites in spine: Secondary | ICD-10-CM | POA: Diagnosis not present

## 2018-12-10 DIAGNOSIS — J40 Bronchitis, not specified as acute or chronic: Secondary | ICD-10-CM | POA: Diagnosis not present

## 2018-12-10 DIAGNOSIS — Z483 Aftercare following surgery for neoplasm: Secondary | ICD-10-CM | POA: Diagnosis not present

## 2018-12-10 DIAGNOSIS — M48061 Spinal stenosis, lumbar region without neurogenic claudication: Secondary | ICD-10-CM | POA: Diagnosis not present

## 2018-12-10 DIAGNOSIS — M4802 Spinal stenosis, cervical region: Secondary | ICD-10-CM | POA: Diagnosis not present

## 2018-12-10 DIAGNOSIS — Z85038 Personal history of other malignant neoplasm of large intestine: Secondary | ICD-10-CM | POA: Diagnosis not present

## 2018-12-10 DIAGNOSIS — Z7901 Long term (current) use of anticoagulants: Secondary | ICD-10-CM | POA: Diagnosis not present

## 2018-12-10 DIAGNOSIS — C187 Malignant neoplasm of sigmoid colon: Secondary | ICD-10-CM

## 2018-12-10 DIAGNOSIS — Z9181 History of falling: Secondary | ICD-10-CM | POA: Diagnosis not present

## 2018-12-10 DIAGNOSIS — Z4801 Encounter for change or removal of surgical wound dressing: Secondary | ICD-10-CM | POA: Diagnosis not present

## 2018-12-10 DIAGNOSIS — M4692 Unspecified inflammatory spondylopathy, cervical region: Secondary | ICD-10-CM | POA: Diagnosis not present

## 2018-12-10 NOTE — Patient Instructions (Signed)
We have sent the referral to Oncology-Dr.Finnegan- someone form their office will contact you to schedule an appointment. If you do not hear from anyone within 5-7 days please call our office  let us know and we can check on this for you.

## 2018-12-11 ENCOUNTER — Encounter: Payer: Self-pay | Admitting: Surgery

## 2018-12-11 DIAGNOSIS — M450 Ankylosing spondylitis of multiple sites in spine: Secondary | ICD-10-CM | POA: Diagnosis not present

## 2018-12-11 DIAGNOSIS — I10 Essential (primary) hypertension: Secondary | ICD-10-CM | POA: Diagnosis not present

## 2018-12-11 DIAGNOSIS — M7500 Adhesive capsulitis of unspecified shoulder: Secondary | ICD-10-CM | POA: Diagnosis not present

## 2018-12-11 DIAGNOSIS — M80071D Age-related osteoporosis with current pathological fracture, right ankle and foot, subsequent encounter for fracture with routine healing: Secondary | ICD-10-CM | POA: Diagnosis not present

## 2018-12-11 DIAGNOSIS — K219 Gastro-esophageal reflux disease without esophagitis: Secondary | ICD-10-CM | POA: Diagnosis not present

## 2018-12-11 DIAGNOSIS — Z4801 Encounter for change or removal of surgical wound dressing: Secondary | ICD-10-CM | POA: Diagnosis not present

## 2018-12-11 DIAGNOSIS — M48061 Spinal stenosis, lumbar region without neurogenic claudication: Secondary | ICD-10-CM | POA: Diagnosis not present

## 2018-12-11 DIAGNOSIS — M80062D Age-related osteoporosis with current pathological fracture, left lower leg, subsequent encounter for fracture with routine healing: Secondary | ICD-10-CM | POA: Diagnosis not present

## 2018-12-11 DIAGNOSIS — Z85038 Personal history of other malignant neoplasm of large intestine: Secondary | ICD-10-CM | POA: Diagnosis not present

## 2018-12-11 DIAGNOSIS — Z7901 Long term (current) use of anticoagulants: Secondary | ICD-10-CM | POA: Diagnosis not present

## 2018-12-11 DIAGNOSIS — M4692 Unspecified inflammatory spondylopathy, cervical region: Secondary | ICD-10-CM | POA: Diagnosis not present

## 2018-12-11 DIAGNOSIS — Z483 Aftercare following surgery for neoplasm: Secondary | ICD-10-CM | POA: Diagnosis not present

## 2018-12-11 DIAGNOSIS — J40 Bronchitis, not specified as acute or chronic: Secondary | ICD-10-CM | POA: Diagnosis not present

## 2018-12-11 DIAGNOSIS — C187 Malignant neoplasm of sigmoid colon: Secondary | ICD-10-CM | POA: Diagnosis not present

## 2018-12-11 DIAGNOSIS — M4802 Spinal stenosis, cervical region: Secondary | ICD-10-CM | POA: Diagnosis not present

## 2018-12-11 DIAGNOSIS — G894 Chronic pain syndrome: Secondary | ICD-10-CM | POA: Diagnosis not present

## 2018-12-11 DIAGNOSIS — Z433 Encounter for attention to colostomy: Secondary | ICD-10-CM | POA: Diagnosis not present

## 2018-12-11 DIAGNOSIS — Z87440 Personal history of urinary (tract) infections: Secondary | ICD-10-CM | POA: Diagnosis not present

## 2018-12-11 DIAGNOSIS — Z9181 History of falling: Secondary | ICD-10-CM | POA: Diagnosis not present

## 2018-12-11 NOTE — Progress Notes (Signed)
Outpatient Surgical Follow Up  12/11/2018  Gloria Allen is an 73 y.o. female.   Chief Complaint  Patient presents with  . Routine Post Op    Colectomy 11/27/2018    HPI:-73 year-old status post total mesorectal excision for rectal sigmoid cancer.  Pathology reviewed with the patient she did have one node that was positive for metastatic disease.  Margins were clear.  Discussed this patient in detail. He is doing otherwise well.  Her loop ileostomy is functioning well.  No fevers no chills she is taking p.o.  Past Medical History:  Diagnosis Date  . Anxiety   . Arthritis   . Asthma   . Cervical central spinal stenosis (C3-C7) (worse at C4-5) 07/30/2017  . Cervical foraminal stenosis (C4-5 and C5-6) (Bilateral) (L>R) 07/30/2017  . Chronic lower extremity pain (Fourth Area of Pain) (Bilateral) (R>L) 06/11/2017  . Chronic neck pain (Primary Area of Pain) (Bilateral) (R>L) 06/11/2017  . Chronic sacroiliac joint pain (Bilateral) (R>L) 06/11/2017  . Chronic shoulder pain [facility] Area of Pain) (Right) 06/11/2017  . Chronic upper extremity pain (Fifth Area of Pain) (Bilateral) (R>L) 06/11/2017  . DDD (degenerative disc disease), cervical 07/30/2017  . DDD (degenerative disc disease), lumbar 07/30/2017  . Depression   . DISH (diffuse idiopathic skeletal hyperostosis) 07/30/2017  . Dyspnea    with exertion  . Entrapment syndrome 06/20/2017   2002 on R and 2010 on L  . Full thickness rotator cuff tear 06/12/2017  . GERD (gastroesophageal reflux disease)   . Grade 1 Anterolisthesis of L4 over L5 07/30/2017  . Headache   . Hx: UTI (urinary tract infection)   . Hypertension   . Inflammation of joint of shoulder region 01/15/2017  . Lumbar central spinal stenosis (L4-5) 07/30/2017  . Lumbar disc protrusion (Left: L5-S1) (Right: L1-2) 07/30/2017   L5-S1 left foraminal protrusion with L5 impingement. L1-2 right paracentral protrusion without impingement.  . Lumbar facet arthropathy (Bilateral)  07/30/2017  . Lumbar facet syndrome (Bilateral) (R>L) 07/30/2017  . Osteoarthritis of shoulder (Right) 01/15/2017    Past Surgical History:  Procedure Laterality Date  . ABDOMINAL HYSTERECTOMY    . APPENDECTOMY    . BREAST REDUCTION SURGERY Bilateral   . COLONOSCOPY WITH PROPOFOL N/A 11/05/2018   Procedure: COLONOSCOPY WITH PROPOFOL;  Surgeon: Virgel Manifold, MD;  Location: [facility] ENDOSCOPY;  Service: Endoscopy;  Laterality: N/A;  . COLONOSCOPY WITH PROPOFOL N/A 11/06/2018   Procedure: COLONOSCOPY WITH PROPOFOL;  Surgeon: Virgel Manifold, MD;  Location: [facility] ENDOSCOPY;  Service: Endoscopy;  Laterality: N/A;  . DILATION AND CURETTAGE OF UTERUS    . ESOPHAGOGASTRODUODENOSCOPY (EGD) WITH PROPOFOL N/A 11/05/2018   Procedure: ESOPHAGOGASTRODUODENOSCOPY (EGD) WITH PROPOFOL;  Surgeon: Virgel Manifold, MD;  Location: [facility] ENDOSCOPY;  Service: Endoscopy;  Laterality: N/A;  . ILEOSTOMY Right 11/27/2018   Procedure: ILEOSTOMY;  Surgeon: Jules Husbands, MD;  Location: [facility] ORS;  Service: General;  Laterality: Right;  . LAPAROSCOPIC SIGMOID COLECTOMY N/A 11/27/2018   Procedure: LAPAROSCOPIC SIGMOID COLECTOMY;  Surgeon: Jules Husbands, MD;  Location: [facility] ORS;  Service: General;  Laterality: N/A;  . ORIF ANKLE FRACTURE Right 11/21/2018   Procedure: OPEN REDUCTION INTERNAL FIXATION (ORIF) ANKLE FRACTURE;  Surgeon: Corky Mull, MD;  Location: [facility] ORS;  Service: Orthopedics;  Laterality: Right;  . REDUCTION MAMMAPLASTY    . SHOULDER ARTHROSCOPY WITH OPEN ROTATOR CUFF REPAIR AND DISTAL CLAVICLE ACROMINECTOMY Right 03/26/2017   Procedure: right shoulder arthroscopy, arthroscopic subacromial decompression, distal clavicle excision, mini open rotator cuff  repair;  Surgeon: Thornton Park, MD;  Location: [facility] ORS;  Service: Orthopedics;  Laterality: Right;    Family History  Problem Relation Age of Onset  . Hypertension Mother   . Breast cancer Mother 90  . Breast cancer Sister 59    Social  History:  reports that she quit smoking about 10 years ago. Her smoking use included cigarettes. She smoked 1.00 pack per day. She has never used smokeless tobacco. She reports that she does not drink alcohol or use drugs.  Allergies:  Allergies  Allergen Reactions  . Ace Inhibitors Swelling  . Angiotensin Receptor Blockers Swelling  . Sulfa Antibiotics Itching  . Chlorthalidone Rash  . Flexeril [Cyclobenzaprine] Rash    Medications reviewed.   ROS Full ROS performed and is otherwise negative other than what is stated in HPI   BP (!) 174/90   Pulse 94   Temp (!) 97.5 F (36.4 C) (Temporal)   Resp 16   Ht 5\' 5"  (1.651 m)   Wt 146 lb 9.6 oz (66.5 kg)   SpO2 98%   BMI 24.40 kg/m   Physical Exam  NAD, alert in good spirits Abd: soft, nt, incision c/d/i, staples removed. Red rubber from loop ileostomy removed. Ostomy pink and patent. Ext: well perfused, cast on her right leg    Assessment/Plan: Carcinoma of the rectosigmoid junction extending to the upper rectum status post total mesorectal excision with 1+ node.  I do recommend referral to oncology for potential adjuvant therapy. We will discuss this in tumor board for further recommendations.  I will see her back in 2 weeks  Caroleen Hamman, MD [place] Surgeon

## 2018-12-12 DIAGNOSIS — Z9181 History of falling: Secondary | ICD-10-CM | POA: Diagnosis not present

## 2018-12-12 DIAGNOSIS — Z7901 Long term (current) use of anticoagulants: Secondary | ICD-10-CM | POA: Diagnosis not present

## 2018-12-12 DIAGNOSIS — Z85038 Personal history of other malignant neoplasm of large intestine: Secondary | ICD-10-CM | POA: Diagnosis not present

## 2018-12-12 DIAGNOSIS — J40 Bronchitis, not specified as acute or chronic: Secondary | ICD-10-CM | POA: Diagnosis not present

## 2018-12-12 DIAGNOSIS — M7500 Adhesive capsulitis of unspecified shoulder: Secondary | ICD-10-CM | POA: Diagnosis not present

## 2018-12-12 DIAGNOSIS — M48061 Spinal stenosis, lumbar region without neurogenic claudication: Secondary | ICD-10-CM | POA: Diagnosis not present

## 2018-12-12 DIAGNOSIS — Z4801 Encounter for change or removal of surgical wound dressing: Secondary | ICD-10-CM | POA: Diagnosis not present

## 2018-12-12 DIAGNOSIS — M80062D Age-related osteoporosis with current pathological fracture, left lower leg, subsequent encounter for fracture with routine healing: Secondary | ICD-10-CM | POA: Diagnosis not present

## 2018-12-12 DIAGNOSIS — M80071D Age-related osteoporosis with current pathological fracture, right ankle and foot, subsequent encounter for fracture with routine healing: Secondary | ICD-10-CM | POA: Diagnosis not present

## 2018-12-12 DIAGNOSIS — Z483 Aftercare following surgery for neoplasm: Secondary | ICD-10-CM | POA: Diagnosis not present

## 2018-12-12 DIAGNOSIS — M450 Ankylosing spondylitis of multiple sites in spine: Secondary | ICD-10-CM | POA: Diagnosis not present

## 2018-12-12 DIAGNOSIS — M4802 Spinal stenosis, cervical region: Secondary | ICD-10-CM | POA: Diagnosis not present

## 2018-12-12 DIAGNOSIS — Z87440 Personal history of urinary (tract) infections: Secondary | ICD-10-CM | POA: Diagnosis not present

## 2018-12-12 DIAGNOSIS — K219 Gastro-esophageal reflux disease without esophagitis: Secondary | ICD-10-CM | POA: Diagnosis not present

## 2018-12-12 DIAGNOSIS — M4692 Unspecified inflammatory spondylopathy, cervical region: Secondary | ICD-10-CM | POA: Diagnosis not present

## 2018-12-12 DIAGNOSIS — Z433 Encounter for attention to colostomy: Secondary | ICD-10-CM | POA: Diagnosis not present

## 2018-12-12 DIAGNOSIS — I10 Essential (primary) hypertension: Secondary | ICD-10-CM | POA: Diagnosis not present

## 2018-12-12 DIAGNOSIS — C187 Malignant neoplasm of sigmoid colon: Secondary | ICD-10-CM | POA: Diagnosis not present

## 2018-12-12 DIAGNOSIS — G894 Chronic pain syndrome: Secondary | ICD-10-CM | POA: Diagnosis not present

## 2018-12-13 DIAGNOSIS — M450 Ankylosing spondylitis of multiple sites in spine: Secondary | ICD-10-CM | POA: Diagnosis not present

## 2018-12-13 DIAGNOSIS — J40 Bronchitis, not specified as acute or chronic: Secondary | ICD-10-CM | POA: Diagnosis not present

## 2018-12-13 DIAGNOSIS — M80071D Age-related osteoporosis with current pathological fracture, right ankle and foot, subsequent encounter for fracture with routine healing: Secondary | ICD-10-CM | POA: Diagnosis not present

## 2018-12-13 DIAGNOSIS — Z87440 Personal history of urinary (tract) infections: Secondary | ICD-10-CM | POA: Diagnosis not present

## 2018-12-13 DIAGNOSIS — Z7901 Long term (current) use of anticoagulants: Secondary | ICD-10-CM | POA: Diagnosis not present

## 2018-12-13 DIAGNOSIS — Z9181 History of falling: Secondary | ICD-10-CM | POA: Diagnosis not present

## 2018-12-13 DIAGNOSIS — Z433 Encounter for attention to colostomy: Secondary | ICD-10-CM | POA: Diagnosis not present

## 2018-12-13 DIAGNOSIS — Z483 Aftercare following surgery for neoplasm: Secondary | ICD-10-CM | POA: Diagnosis not present

## 2018-12-13 DIAGNOSIS — M4692 Unspecified inflammatory spondylopathy, cervical region: Secondary | ICD-10-CM | POA: Diagnosis not present

## 2018-12-13 DIAGNOSIS — I10 Essential (primary) hypertension: Secondary | ICD-10-CM | POA: Diagnosis not present

## 2018-12-13 DIAGNOSIS — G894 Chronic pain syndrome: Secondary | ICD-10-CM | POA: Diagnosis not present

## 2018-12-13 DIAGNOSIS — M7500 Adhesive capsulitis of unspecified shoulder: Secondary | ICD-10-CM | POA: Diagnosis not present

## 2018-12-13 DIAGNOSIS — M48061 Spinal stenosis, lumbar region without neurogenic claudication: Secondary | ICD-10-CM | POA: Diagnosis not present

## 2018-12-13 DIAGNOSIS — C187 Malignant neoplasm of sigmoid colon: Secondary | ICD-10-CM | POA: Diagnosis not present

## 2018-12-13 DIAGNOSIS — Z4801 Encounter for change or removal of surgical wound dressing: Secondary | ICD-10-CM | POA: Diagnosis not present

## 2018-12-13 DIAGNOSIS — K219 Gastro-esophageal reflux disease without esophagitis: Secondary | ICD-10-CM | POA: Diagnosis not present

## 2018-12-13 DIAGNOSIS — M4802 Spinal stenosis, cervical region: Secondary | ICD-10-CM | POA: Diagnosis not present

## 2018-12-13 DIAGNOSIS — M80062D Age-related osteoporosis with current pathological fracture, left lower leg, subsequent encounter for fracture with routine healing: Secondary | ICD-10-CM | POA: Diagnosis not present

## 2018-12-13 DIAGNOSIS — Z85038 Personal history of other malignant neoplasm of large intestine: Secondary | ICD-10-CM | POA: Diagnosis not present

## 2018-12-15 DIAGNOSIS — I10 Essential (primary) hypertension: Secondary | ICD-10-CM | POA: Diagnosis not present

## 2018-12-15 DIAGNOSIS — Z85038 Personal history of other malignant neoplasm of large intestine: Secondary | ICD-10-CM | POA: Diagnosis not present

## 2018-12-15 DIAGNOSIS — G894 Chronic pain syndrome: Secondary | ICD-10-CM | POA: Diagnosis not present

## 2018-12-15 DIAGNOSIS — M80071D Age-related osteoporosis with current pathological fracture, right ankle and foot, subsequent encounter for fracture with routine healing: Secondary | ICD-10-CM | POA: Diagnosis not present

## 2018-12-15 DIAGNOSIS — C187 Malignant neoplasm of sigmoid colon: Secondary | ICD-10-CM | POA: Diagnosis not present

## 2018-12-15 DIAGNOSIS — M80062D Age-related osteoporosis with current pathological fracture, left lower leg, subsequent encounter for fracture with routine healing: Secondary | ICD-10-CM | POA: Diagnosis not present

## 2018-12-15 DIAGNOSIS — K219 Gastro-esophageal reflux disease without esophagitis: Secondary | ICD-10-CM | POA: Diagnosis not present

## 2018-12-15 DIAGNOSIS — Z9181 History of falling: Secondary | ICD-10-CM | POA: Diagnosis not present

## 2018-12-15 DIAGNOSIS — J40 Bronchitis, not specified as acute or chronic: Secondary | ICD-10-CM | POA: Diagnosis not present

## 2018-12-15 DIAGNOSIS — Z87440 Personal history of urinary (tract) infections: Secondary | ICD-10-CM | POA: Diagnosis not present

## 2018-12-15 DIAGNOSIS — S82841D Displaced bimalleolar fracture of right lower leg, subsequent encounter for closed fracture with routine healing: Secondary | ICD-10-CM | POA: Diagnosis not present

## 2018-12-15 DIAGNOSIS — M4802 Spinal stenosis, cervical region: Secondary | ICD-10-CM | POA: Diagnosis not present

## 2018-12-15 DIAGNOSIS — M48061 Spinal stenosis, lumbar region without neurogenic claudication: Secondary | ICD-10-CM | POA: Diagnosis not present

## 2018-12-15 DIAGNOSIS — M4692 Unspecified inflammatory spondylopathy, cervical region: Secondary | ICD-10-CM | POA: Diagnosis not present

## 2018-12-15 DIAGNOSIS — Z433 Encounter for attention to colostomy: Secondary | ICD-10-CM | POA: Diagnosis not present

## 2018-12-15 DIAGNOSIS — M7500 Adhesive capsulitis of unspecified shoulder: Secondary | ICD-10-CM | POA: Diagnosis not present

## 2018-12-15 DIAGNOSIS — M450 Ankylosing spondylitis of multiple sites in spine: Secondary | ICD-10-CM | POA: Diagnosis not present

## 2018-12-15 DIAGNOSIS — Z7901 Long term (current) use of anticoagulants: Secondary | ICD-10-CM | POA: Diagnosis not present

## 2018-12-15 DIAGNOSIS — Z483 Aftercare following surgery for neoplasm: Secondary | ICD-10-CM | POA: Diagnosis not present

## 2018-12-15 DIAGNOSIS — Z4801 Encounter for change or removal of surgical wound dressing: Secondary | ICD-10-CM | POA: Diagnosis not present

## 2018-12-16 DIAGNOSIS — M48061 Spinal stenosis, lumbar region without neurogenic claudication: Secondary | ICD-10-CM | POA: Diagnosis not present

## 2018-12-16 DIAGNOSIS — M80062D Age-related osteoporosis with current pathological fracture, left lower leg, subsequent encounter for fracture with routine healing: Secondary | ICD-10-CM | POA: Diagnosis not present

## 2018-12-16 DIAGNOSIS — Z87440 Personal history of urinary (tract) infections: Secondary | ICD-10-CM | POA: Diagnosis not present

## 2018-12-16 DIAGNOSIS — Z4801 Encounter for change or removal of surgical wound dressing: Secondary | ICD-10-CM | POA: Diagnosis not present

## 2018-12-16 DIAGNOSIS — J40 Bronchitis, not specified as acute or chronic: Secondary | ICD-10-CM | POA: Diagnosis not present

## 2018-12-16 DIAGNOSIS — M7500 Adhesive capsulitis of unspecified shoulder: Secondary | ICD-10-CM | POA: Diagnosis not present

## 2018-12-16 DIAGNOSIS — M4692 Unspecified inflammatory spondylopathy, cervical region: Secondary | ICD-10-CM | POA: Diagnosis not present

## 2018-12-16 DIAGNOSIS — M80071D Age-related osteoporosis with current pathological fracture, right ankle and foot, subsequent encounter for fracture with routine healing: Secondary | ICD-10-CM | POA: Diagnosis not present

## 2018-12-16 DIAGNOSIS — G894 Chronic pain syndrome: Secondary | ICD-10-CM | POA: Diagnosis not present

## 2018-12-16 DIAGNOSIS — M450 Ankylosing spondylitis of multiple sites in spine: Secondary | ICD-10-CM | POA: Diagnosis not present

## 2018-12-16 DIAGNOSIS — Z85038 Personal history of other malignant neoplasm of large intestine: Secondary | ICD-10-CM | POA: Diagnosis not present

## 2018-12-16 DIAGNOSIS — I10 Essential (primary) hypertension: Secondary | ICD-10-CM | POA: Diagnosis not present

## 2018-12-16 DIAGNOSIS — Z483 Aftercare following surgery for neoplasm: Secondary | ICD-10-CM | POA: Diagnosis not present

## 2018-12-16 DIAGNOSIS — K219 Gastro-esophageal reflux disease without esophagitis: Secondary | ICD-10-CM | POA: Diagnosis not present

## 2018-12-16 DIAGNOSIS — Z7901 Long term (current) use of anticoagulants: Secondary | ICD-10-CM | POA: Diagnosis not present

## 2018-12-16 DIAGNOSIS — Z433 Encounter for attention to colostomy: Secondary | ICD-10-CM | POA: Diagnosis not present

## 2018-12-16 DIAGNOSIS — M4802 Spinal stenosis, cervical region: Secondary | ICD-10-CM | POA: Diagnosis not present

## 2018-12-16 DIAGNOSIS — C187 Malignant neoplasm of sigmoid colon: Secondary | ICD-10-CM | POA: Diagnosis not present

## 2018-12-16 DIAGNOSIS — Z9181 History of falling: Secondary | ICD-10-CM | POA: Diagnosis not present

## 2018-12-18 ENCOUNTER — Telehealth: Payer: Self-pay | Admitting: Gastroenterology

## 2018-12-18 DIAGNOSIS — G894 Chronic pain syndrome: Secondary | ICD-10-CM | POA: Diagnosis not present

## 2018-12-18 DIAGNOSIS — K219 Gastro-esophageal reflux disease without esophagitis: Secondary | ICD-10-CM | POA: Diagnosis not present

## 2018-12-18 DIAGNOSIS — I10 Essential (primary) hypertension: Secondary | ICD-10-CM | POA: Diagnosis not present

## 2018-12-18 DIAGNOSIS — Z85038 Personal history of other malignant neoplasm of large intestine: Secondary | ICD-10-CM | POA: Diagnosis not present

## 2018-12-18 DIAGNOSIS — Z87440 Personal history of urinary (tract) infections: Secondary | ICD-10-CM | POA: Diagnosis not present

## 2018-12-18 DIAGNOSIS — C187 Malignant neoplasm of sigmoid colon: Secondary | ICD-10-CM | POA: Diagnosis not present

## 2018-12-18 DIAGNOSIS — J40 Bronchitis, not specified as acute or chronic: Secondary | ICD-10-CM | POA: Diagnosis not present

## 2018-12-18 DIAGNOSIS — Z7901 Long term (current) use of anticoagulants: Secondary | ICD-10-CM | POA: Diagnosis not present

## 2018-12-18 DIAGNOSIS — M48061 Spinal stenosis, lumbar region without neurogenic claudication: Secondary | ICD-10-CM | POA: Diagnosis not present

## 2018-12-18 DIAGNOSIS — M7500 Adhesive capsulitis of unspecified shoulder: Secondary | ICD-10-CM | POA: Diagnosis not present

## 2018-12-18 DIAGNOSIS — Z433 Encounter for attention to colostomy: Secondary | ICD-10-CM | POA: Diagnosis not present

## 2018-12-18 DIAGNOSIS — Z9181 History of falling: Secondary | ICD-10-CM | POA: Diagnosis not present

## 2018-12-18 DIAGNOSIS — M80062D Age-related osteoporosis with current pathological fracture, left lower leg, subsequent encounter for fracture with routine healing: Secondary | ICD-10-CM | POA: Diagnosis not present

## 2018-12-18 DIAGNOSIS — Z4801 Encounter for change or removal of surgical wound dressing: Secondary | ICD-10-CM | POA: Diagnosis not present

## 2018-12-18 DIAGNOSIS — M4692 Unspecified inflammatory spondylopathy, cervical region: Secondary | ICD-10-CM | POA: Diagnosis not present

## 2018-12-18 DIAGNOSIS — M450 Ankylosing spondylitis of multiple sites in spine: Secondary | ICD-10-CM | POA: Diagnosis not present

## 2018-12-18 DIAGNOSIS — M4802 Spinal stenosis, cervical region: Secondary | ICD-10-CM | POA: Diagnosis not present

## 2018-12-18 DIAGNOSIS — Z483 Aftercare following surgery for neoplasm: Secondary | ICD-10-CM | POA: Diagnosis not present

## 2018-12-18 DIAGNOSIS — M80071D Age-related osteoporosis with current pathological fracture, right ankle and foot, subsequent encounter for fracture with routine healing: Secondary | ICD-10-CM | POA: Diagnosis not present

## 2018-12-18 NOTE — Telephone Encounter (Signed)
Gloria Allen, Have pt to do a telemed call next week. Thank you, Jackelyn Poling

## 2018-12-18 NOTE — Telephone Encounter (Signed)
LMTCO.

## 2018-12-18 NOTE — Telephone Encounter (Signed)
Spoke with pt to r/s apt due to [facility] Virus pt is r/s but she would like a call she states she is having abdominal pain please call pt

## 2018-12-19 ENCOUNTER — Inpatient Hospital Stay: Payer: Medicare Other | Attending: Oncology | Admitting: Oncology

## 2018-12-19 ENCOUNTER — Inpatient Hospital Stay: Payer: Medicare Other

## 2018-12-19 ENCOUNTER — Other Ambulatory Visit: Payer: Self-pay

## 2018-12-19 VITALS — BP 189/77 | HR 59 | Temp 97.8°F | Resp 16 | Ht 65.0 in | Wt 147.5 lb

## 2018-12-19 DIAGNOSIS — D649 Anemia, unspecified: Secondary | ICD-10-CM | POA: Diagnosis not present

## 2018-12-19 DIAGNOSIS — C189 Malignant neoplasm of colon, unspecified: Secondary | ICD-10-CM | POA: Insufficient documentation

## 2018-12-19 DIAGNOSIS — C187 Malignant neoplasm of sigmoid colon: Secondary | ICD-10-CM | POA: Diagnosis not present

## 2018-12-19 DIAGNOSIS — C182 Malignant neoplasm of ascending colon: Secondary | ICD-10-CM

## 2018-12-19 DIAGNOSIS — Z7189 Other specified counseling: Secondary | ICD-10-CM | POA: Insufficient documentation

## 2018-12-19 LAB — CBC WITH DIFFERENTIAL/PLATELET
Abs Immature Granulocytes: 0.03 10*3/uL (ref 0.00–0.07)
BASOS PCT: 2 %
Basophils Absolute: 0.1 10*3/uL (ref 0.0–0.1)
Eosinophils Absolute: 0.5 10*3/uL (ref 0.0–0.5)
Eosinophils Relative: 8 %
HCT: 34.3 % — ABNORMAL LOW (ref 36.0–46.0)
Hemoglobin: 10.5 g/dL — ABNORMAL LOW (ref 12.0–15.0)
Immature Granulocytes: 0 %
Lymphocytes Relative: 26 %
Lymphs Abs: 1.9 10*3/uL (ref 0.7–4.0)
MCH: 26.3 pg (ref 26.0–34.0)
MCHC: 30.6 g/dL (ref 30.0–36.0)
MCV: 86 fL (ref 80.0–100.0)
Monocytes Absolute: 0.4 10*3/uL (ref 0.1–1.0)
Monocytes Relative: 5 %
Neutro Abs: 4.3 10*3/uL (ref 1.7–7.7)
Neutrophils Relative %: 59 %
Platelets: 423 10*3/uL — ABNORMAL HIGH (ref 150–400)
RBC: 3.99 MIL/uL (ref 3.87–5.11)
RDW: 13.8 % (ref 11.5–15.5)
WBC: 7.2 10*3/uL (ref 4.0–10.5)
nRBC: 0 % (ref 0.0–0.2)

## 2018-12-19 LAB — COMPREHENSIVE METABOLIC PANEL
ALT: 17 U/L (ref 0–44)
AST: 21 U/L (ref 15–41)
Albumin: 3.8 g/dL (ref 3.5–5.0)
Alkaline Phosphatase: 109 U/L (ref 38–126)
Anion gap: 8 (ref 5–15)
BUN: 9 mg/dL (ref 8–23)
CALCIUM: 9.5 mg/dL (ref 8.9–10.3)
CO2: 25 mmol/L (ref 22–32)
Chloride: 105 mmol/L (ref 98–111)
Creatinine, Ser: 0.62 mg/dL (ref 0.44–1.00)
GFR calc Af Amer: >60 mL/min (ref >60)
Glucose, Bld: 126 mg/dL — ABNORMAL HIGH (ref 70–99)
Potassium: 3.9 mmol/L (ref 3.5–5.1)
Sodium: 138 mmol/L (ref 135–145)
Total Bilirubin: 0.3 mg/dL (ref 0.3–1.2)
Total Protein: 7.7 g/dL (ref 6.5–8.1)

## 2018-12-19 LAB — FOLATE: FOLATE: 6.8 ng/mL (ref >5.9)

## 2018-12-19 LAB — VITAMIN B12: Vitamin B-12: 447 pg/mL (ref 180–914)

## 2018-12-20 LAB — CEA: CEA: 2.4 ng/mL (ref 0.0–4.7)

## 2018-12-22 ENCOUNTER — Telehealth: Payer: Self-pay | Admitting: *Deleted

## 2018-12-22 ENCOUNTER — Encounter: Payer: Self-pay | Admitting: Oncology

## 2018-12-22 NOTE — Telephone Encounter (Signed)
Patient and got her voicemail left her a message about her hemoglobin is better since the last time and it is now 10.5.  Her folate as well as her ferritin was a little bit low and so Dr. Janese Banks would encourage her to take a ferrous sulfate 325 mg that she can purchase over-the-counter at any pharmacy and take the pill once a day and making sure she is eating food before she takes it because it can cause constipation as well as some cramping in the stomach.  He does well on the once a day Dr. Janese Banks would like her to go up to twice a day if tolerated.  Asked the patient to call back if she has any questions or concerns

## 2018-12-22 NOTE — Progress Notes (Signed)
Hematology/Oncology Consult note [facility] [phone] [phone]  Patient Care Team: Inc, [facility] as PCP - General   Name of the patient: Gloria Allen  [id number]  Nov 11, 1945    Reason for referral- new diagnosis of colon cancer   Referring physician- Dr. Dahlia Byes  Date of visit: 12/22/18   History of presenting illness-patient is a 73 year old African-American female who recently underwent screening colonoscopy by Dr. Bonna Gains. Colonoscopy showed villous nonobstructing large mass in the sigmoid colon 12 cm proximal to the anus.  Mass was partially circumferential.  Biopsy showed invasive adenocarcinoma.  Patient was seen by Dr. Adora Fridge and underwent hemicolectomy with lymph node sampling on 11/27/2018.  Final pathology showed invasive colorectal carcinoma 3.5 cm, grade 2.  Tumor invades through muscularis propria into the peri-colorectal tissue.  Margins negative.  Tumor deposits present, 2.  1 out of 9 lymph nodes examined was positive for malignancy.  [id number]  Patient lives alone and has no social support.  Reports that her children are around [place] but are not able to take care of her.  She still reports feeling fatigued and has trouble getting around the house.  She has a colostomy in place.  She also had fracture of her right ankle which limits her mobility and she continues to have pain in that area ECOG PS- 2  Pain scale- 8-right ankle pain   Review of systems- Review of Systems  Constitutional: Positive for malaise/fatigue. Negative for chills, fever and weight loss.  HENT: Negative for congestion, ear discharge and nosebleeds.   Eyes: Negative for blurred vision.  Respiratory: Negative for cough, hemoptysis, sputum production, shortness of breath and wheezing.   Cardiovascular: Negative for chest pain, palpitations, orthopnea and claudication.  Gastrointestinal: Negative for abdominal pain, blood in stool,  constipation, diarrhea, heartburn, melena, nausea and vomiting.  Genitourinary: Negative for dysuria, flank pain, frequency, hematuria and urgency.  Musculoskeletal: Positive for joint pain. Negative for back pain and myalgias.  Skin: Negative for rash.  Neurological: Negative for dizziness, tingling, focal weakness, seizures, weakness and headaches.  Endo/Heme/Allergies: Does not bruise/bleed easily.  Psychiatric/Behavioral: Negative for depression and suicidal ideas. The patient does not have insomnia.     Allergies  Allergen Reactions  . Ace Inhibitors Swelling  . Angiotensin Receptor Blockers Swelling  . Sulfa Antibiotics Itching  . Chlorthalidone Rash  . Flexeril [Cyclobenzaprine] Rash    Patient Active Problem List   Diagnosis Date Noted  . Goals of care, counseling/discussion 12/19/2018  . Malignant neoplasm of ascending colon ([place]) 12/19/2018  . S/P laparoscopic colectomy 11/27/2018  . Closed bimalleolar fracture of right ankle 11/24/2018  . Ankylosing spondylitis of multiple sites in spine ([place]) 11/21/2018  . Severe episode of recurrent major depressive disorder, without psychotic features ([place]) 11/21/2018  . Ankle fracture, right 11/21/2018  . Benign neoplasm of ascending colon   . Malignant neoplasm of sigmoid colon ([place])   . Melanosis, colon   . Special screening for malignant neoplasms, colon   . Maculopapular rash   . Colon neoplasm   . Melena   . Columnar epithelial-lined lower esophagus   . Stomach irritation   . IFG (impaired fasting glucose) 04/22/2018  . Ataxia 03/04/2018  . Atypical chest pain 03/04/2018  . SOB (shortness of breath) 01/26/2018  . Laryngopharyngeal reflux (LPR) 08/16/2017  . Suspected exposure to mold 08/05/2017  . Poor historian 08/05/2017  . Lumbar foraminal stenosis (Bilateral: L4-5) (Left: L5-S1) 07/30/2017  . Osteopenia of spine 07/30/2017  .  PTSD (post-traumatic stress disorder) 06/20/2017  . Cervical arthritis 06/20/2017  .  Bronchial asthma 06/20/2017  . Long term current use of opiate analgesic 06/11/2017  . Chronic pain syndrome 06/11/2017  . Anxiety   . Depression   . GERD (gastroesophageal reflux disease)   . Benign hypertensive renal disease   . History of rotator cuff repair (Right) 03/26/2017     Past Medical History:  Diagnosis Date  . Anxiety   . Arthritis   . Asthma   . Cervical central spinal stenosis (C3-C7) (worse at C4-5) 07/30/2017  . Cervical foraminal stenosis (C4-5 and C5-6) (Bilateral) (L>R) 07/30/2017  . Chronic lower extremity pain (Fourth Area of Pain) (Bilateral) (R>L) 06/11/2017  . Chronic neck pain (Primary Area of Pain) (Bilateral) (R>L) 06/11/2017  . Chronic sacroiliac joint pain (Bilateral) (R>L) 06/11/2017  . Chronic shoulder pain [facility] Area of Pain) (Right) 06/11/2017  . Chronic upper extremity pain (Fifth Area of Pain) (Bilateral) (R>L) 06/11/2017  . DDD (degenerative disc disease), cervical 07/30/2017  . DDD (degenerative disc disease), lumbar 07/30/2017  . Depression   . DISH (diffuse idiopathic skeletal hyperostosis) 07/30/2017  . Dyspnea    with exertion  . Entrapment syndrome 06/20/2017   2002 on R and 2010 on L  . Full thickness rotator cuff tear 06/12/2017  . GERD (gastroesophageal reflux disease)   . Grade 1 Anterolisthesis of L4 over L5 07/30/2017  . Headache   . Hx: UTI (urinary tract infection)   . Hypertension   . Inflammation of joint of shoulder region 01/15/2017  . Lumbar central spinal stenosis (L4-5) 07/30/2017  . Lumbar disc protrusion (Left: L5-S1) (Right: L1-2) 07/30/2017   L5-S1 left foraminal protrusion with L5 impingement. L1-2 right paracentral protrusion without impingement.  . Lumbar facet arthropathy (Bilateral) 07/30/2017  . Lumbar facet syndrome (Bilateral) (R>L) 07/30/2017  . Osteoarthritis of shoulder (Right) 01/15/2017     Past Surgical History:  Procedure Laterality Date  . ABDOMINAL HYSTERECTOMY    . APPENDECTOMY    . BREAST  REDUCTION SURGERY Bilateral   . COLONOSCOPY WITH PROPOFOL N/A 11/05/2018   Procedure: COLONOSCOPY WITH PROPOFOL;  Surgeon: Virgel Manifold, MD;  Location: [facility] ENDOSCOPY;  Service: Endoscopy;  Laterality: N/A;  . COLONOSCOPY WITH PROPOFOL N/A 11/06/2018   Procedure: COLONOSCOPY WITH PROPOFOL;  Surgeon: Virgel Manifold, MD;  Location: [facility] ENDOSCOPY;  Service: Endoscopy;  Laterality: N/A;  . DILATION AND CURETTAGE OF UTERUS    . ESOPHAGOGASTRODUODENOSCOPY (EGD) WITH PROPOFOL N/A 11/05/2018   Procedure: ESOPHAGOGASTRODUODENOSCOPY (EGD) WITH PROPOFOL;  Surgeon: Virgel Manifold, MD;  Location: [facility] ENDOSCOPY;  Service: Endoscopy;  Laterality: N/A;  . ILEOSTOMY Right 11/27/2018   Procedure: ILEOSTOMY;  Surgeon: Jules Husbands, MD;  Location: [facility] ORS;  Service: General;  Laterality: Right;  . LAPAROSCOPIC SIGMOID COLECTOMY N/A 11/27/2018   Procedure: LAPAROSCOPIC SIGMOID COLECTOMY;  Surgeon: Jules Husbands, MD;  Location: [facility] ORS;  Service: General;  Laterality: N/A;  . ORIF ANKLE FRACTURE Right 11/21/2018   Procedure: OPEN REDUCTION INTERNAL FIXATION (ORIF) ANKLE FRACTURE;  Surgeon: Corky Mull, MD;  Location: [facility] ORS;  Service: Orthopedics;  Laterality: Right;  . REDUCTION MAMMAPLASTY    . SHOULDER ARTHROSCOPY WITH OPEN ROTATOR CUFF REPAIR AND DISTAL CLAVICLE ACROMINECTOMY Right 03/26/2017   Procedure: right shoulder arthroscopy, arthroscopic subacromial decompression, distal clavicle excision, mini open rotator cuff repair;  Surgeon: Thornton Park, MD;  Location: [facility] ORS;  Service: Orthopedics;  Laterality: Right;    Social History   Socioeconomic History  . Marital  status: Divorced    Spouse name: Not on file  . Number of children: 3  . Years of education: Not on file  . Highest education level: 11th grade  Occupational History  . Not on file  Social Needs  . Financial resource strain: Somewhat hard  . Food insecurity:    Worry: Sometimes true    Inability: Never true  .  Transportation needs:    Medical: Yes    Non-medical: No  Tobacco Use  . Smoking status: Former Smoker    Packs/day: 1.00    Types: Cigarettes    Last attempt to quit: 2010    Years since quitting: 10.2  . Smokeless tobacco: Never Used  . Tobacco comment: quit over 30 years ago   Substance and Sexual Activity  . Alcohol use: No  . Drug use: No  . Sexual activity: Never  Lifestyle  . Physical activity:    Days per week: 0 days    Minutes per session: 0 min  . Stress: Not at all  Relationships  . Social connections:    Talks on phone: Never    Gets together: Never    Attends religious service: More than 4 times per year    Active member of club or organization: No    Attends meetings of clubs or organizations: Never    Relationship status: Divorced  . Intimate partner violence:    Fear of current or ex partner: No    Emotionally abused: No    Physically abused: No    Forced sexual activity: No  Other Topics Concern  . Not on file  Social History Narrative  . Not on file     Family History  Problem Relation Age of Onset  . Hypertension Mother   . Breast cancer Mother 66  . Breast cancer Sister 41     Current Outpatient Medications:  .  clonazePAM (KLONOPIN) 0.5 MG tablet, Take 0.5 mg by mouth 3 (three) times daily. , Disp: , Rfl:  .  cloNIDine (CATAPRES) 0.3 MG tablet, Take 2 tablets (0.6 mg total) by mouth 2 (two) times daily. (Patient taking differently: Take 0.3 mg by mouth 2 (two) times daily. ), Disp: 120 tablet, Rfl: 2 .  cyclobenzaprine (FLEXERIL) 5 MG tablet, Take 5 mg by mouth at bedtime., Disp: , Rfl:  .  hydrALAZINE (APRESOLINE) 100 MG tablet, Take 1 tablet (100 mg total) by mouth 3 (three) times daily. (Patient taking differently: Take 100 mg by mouth daily. ), Disp: 90 tablet, Rfl: 1 .  hydrOXYzine (ATARAX/VISTARIL) 10 MG tablet, TAKE 1 TABLET BY MOUTH THREE TIMES A DAY AS NEEDED (Patient taking differently: Take 10 mg by mouth 3 (three) times daily as  needed for itching. ), Disp: 90 tablet, Rfl: 0 .  lovastatin (MEVACOR) 40 MG tablet, Take 1 tablet (40 mg total) by mouth at bedtime., Disp: 90 tablet, Rfl: 1 .  meclizine (ANTIVERT) 12.5 MG tablet, Take 12.5 mg by mouth 3 (three) times daily as needed for dizziness. , Disp: , Rfl: 0 .  methocarbamol (ROBAXIN) 500 MG tablet, Take 1 tablet (500 mg total) by mouth at bedtime as needed for muscle spasms., Disp: 30 tablet, Rfl: 0 .  metoprolol succinate (TOPROL-XL) 100 MG 24 hr tablet, TAKE 1 TABLET BY MOUTH DAILY, Disp: 30 tablet, Rfl: 0 .  mirtazapine (REMERON) 15 MG tablet, take 1 tablet by mouth at bedtime for APPETITIE (Patient taking differently: Take 15 mg by mouth at bedtime. take 1 tablet by  mouth at bedtime for APPETITIE), Disp: 180 tablet, Rfl: 0 .  montelukast (SINGULAIR) 10 MG tablet, Take 1 tablet (10 mg total) by mouth at bedtime as needed., Disp: 90 tablet, Rfl: 1 .  omeprazole (PRILOSEC) 40 MG capsule, Take 1 capsule (40 mg total) by mouth 2 (two) times daily., Disp: 60 capsule, Rfl: 2 .  oxybutynin (DITROPAN) 5 MG tablet, TAKE 1 TABLET BY MOUTH TWICE DAILY (Patient taking differently: Take 5 mg by mouth 2 (two) times daily. ), Disp: 180 tablet, Rfl: 0 .  oxyCODONE (OXY IR/ROXICODONE) 5 MG immediate release tablet, Take 1-2 tablets (5-10 mg total) by mouth every 4 (four) hours as needed for moderate pain., Disp: 30 tablet, Rfl: 0 .  Potassium 99 MG TABS, Take 1 tablet by mouth daily as needed (cramps). , Disp: , Rfl:  .  sertraline (ZOLOFT) 100 MG tablet, Take 2 tablets (200 mg total) by mouth at bedtime., Disp: 60 tablet, Rfl: 3 .  terazosin (HYTRIN) 5 MG capsule, TAKE 1 CAPSULE BY MOUTH ONCE DAILY AT BEDTIME, Disp: 30 capsule, Rfl: 0 .  traMADol (ULTRAM) 50 MG tablet, Take 1 tablet (50 mg total) by mouth every 6 (six) hours., Disp: 30 tablet, Rfl: 1 .  traZODone (DESYREL) 50 MG tablet, Take 0.5-1 tablets (25-50 mg total) by mouth at bedtime as needed for sleep. (Patient taking  differently: Take 50 mg by mouth at bedtime. ), Disp: 30 tablet, Rfl: 3 .  albuterol (PROVENTIL HFA;VENTOLIN HFA) 108 (90 Base) MCG/ACT inhaler, Inhale 2 puffs into the lungs every 6 (six) hours as needed for wheezing or shortness of breath. (Patient not taking: Reported on 12/19/2018), Disp: 1 Inhaler, Rfl: 3 .  ANORO ELLIPTA 62.5-25 MCG/INH AEPB, INHALE 1 PUFF BY MOUTH DAILY (Patient not taking: No sig reported), Disp: 60 each, Rfl: 0 .  bisacodyl (BISACODYL) 5 MG EC tablet, Take 4 tablets (5 mg each) at 8 am the day before surgery., Disp: 4 tablet, Rfl: 0 .  BREO ELLIPTA 200-25 MCG/INH AEPB, Inhale 1 puff into the lungs daily. , Disp: , Rfl:  .  enoxaparin (LOVENOX) 40 MG/0.4ML injection, Inject 0.4 mLs (40 mg total) into the skin daily., Disp: 14 Syringe, Rfl: 0 .  Erythromycin 500 MG TBEC, Take 500 mg by mouth as directed. Take two tablets at 8 am, two tablets at 2 pm, and two tablets at 8 pm the day before surgery., Disp: 6 tablet, Rfl: 0 .  hydrALAZINE (APRESOLINE) 50 MG tablet, TAKE 1 TABLET BY MOUTH THREE TIMES DAILY, Disp: 90 tablet, Rfl: 0 .  metoprolol succinate (TOPROL-XL) 50 MG 24 hr tablet, Take 50 mg by mouth daily. Take with or immediately following a meal., Disp: , Rfl:  .  neomycin (MYCIFRADIN) 500 MG tablet, Take two tablets at 8 am, two tablets at 2 pm, and two tablets at 8 pm the day before surgery., Disp: 6 tablet, Rfl: 0 .  nitrofurantoin (MACRODANTIN) 100 MG capsule, Take 100 mg by mouth daily. , Disp: , Rfl:  .  polyethylene glycol powder (GLYCOLAX/MIRALAX) powder, 255 grams one bottle for bowel prep, Disp: 255 g, Rfl: 0 .  triamcinolone cream (KENALOG) 0.1 %, APPLY TOPICALLY TWICE DAILY AS NEEDED FOR ITCHING (Patient taking differently: Apply 1 application topically 2 (two) times daily as needed (itching). ), Disp: 30 g, Rfl: 0   Physical exam:  Vitals:   12/19/18 1058  BP: (!) 189/77  Pulse: (!) 59  Resp: 16  Temp: 97.8 F (36.6 C)  TempSrc: Tympanic  Weight: 147 lb  8 oz (66.9 kg)  Height: 5\' 5"  (1.651 m)   Physical Exam Constitutional:      Comments: Elderly female sitting in a wheelchair.  She appears anxious and fatigued  HENT:     Head: Normocephalic and atraumatic.  Eyes:     Pupils: Pupils are equal, round, and reactive to light.  Neck:     Musculoskeletal: Normal range of motion.  Cardiovascular:     Rate and Rhythm: Normal rate and regular rhythm.     Heart sounds: Normal heart sounds.  Pulmonary:     Effort: Pulmonary effort is normal.     Breath sounds: Normal breath sounds.  Abdominal:     General: Bowel sounds are normal.     Palpations: Abdomen is soft.     Comments: Colostomy in place  Skin:    General: Skin is warm and dry.  Neurological:     Mental Status: She is alert and oriented to person, place, and time.        CMP Latest Ref Rng & Units 12/19/2018  Glucose 70 - 99 mg/dL 126(H)  BUN 8 - 23 mg/dL 9  Creatinine 0.44 - 1.00 mg/dL 0.62  Sodium 135 - 145 mmol/L 138  Potassium 3.5 - 5.1 mmol/L 3.9  Chloride 98 - 111 mmol/L 105  CO2 22 - 32 mmol/L 25  Calcium 8.9 - 10.3 mg/dL 9.5  Total Protein 6.5 - 8.1 g/dL 7.7  Total Bilirubin 0.3 - 1.2 mg/dL 0.3  Alkaline Phos 38 - 126 U/L 109  AST 15 - 41 U/L 21  ALT 0 - 44 U/L 17   CBC Latest Ref Rng & Units 12/19/2018  WBC 4.0 - 10.5 K/uL 7.2  Hemoglobin 12.0 - 15.0 g/dL 10.5(L)  Hematocrit 36.0 - 46.0 % 34.3(L)  Platelets 150 - 400 K/uL 423(H)    No images are attached to the encounter.  Dg Chest Port 1 View  Result Date: 11/28/2018 CLINICAL DATA:  Dyspnea EXAM: PORTABLE CHEST 1 VIEW COMPARISON:  11/21/2018 FINDINGS: Cardiac shadow is stable. The lungs are clear bilaterally. No focal infiltrate or sizable effusion is seen. No bony abnormality is noted. IMPRESSION: No active disease. Electronically Signed   By: Inez Catalina M.D.   On: 11/28/2018 13:23    Assessment and plan- Patient is a 73 y.o. female with newly diagnosed invasive adenocarcinoma of the colon stage  III BpT3 pN1a cMx status post hemicolectomy   I discussed the results of the colonoscopy as well as hemicolectomy and final pathology with the patient in detail.  Patient has stage IIIb colon cancer.  Given that she had lymph node positive disease, as per data from the mosaic prior she would benefit from adjuvant chemotherapy either [place] ox for 3 months or FOLFOX for 6 months.I explained to the patient the risks and benefits of chemotherapy including all but not limited to nausea, vomiting, low blood counts and risk of infection and hospitalization. Risk of peripheral neuropathy associated with oxaliplatin.  Treatment will be given with a curative intent. Given the ongoing COVID pandemic, she would be at an increased risk of morbidity/mortality should she get COVID infection during active chemotherapy.  Patient states that she lives alone and has no social support.  She has no friends or family who can help her or take care of her.  She still has limited mobility because of her recent ankle fracture and continues to be fatigued since her surgery.  She is worried about going through  chemotherapy at this time and managing the side effects all by herself especially in the middle of COVID pandemic.  She understands there is a risk of recurrent disease which can be potentially metastatic if she does not proceed with adjuvant chemotherapy.  She does not wish to proceed with chemotherapy at this time after considering risks and benefits as well as her existing quality of life.  She will proceed with a CT chest in the next 1 to 2 weeks to complete her baseline staging work-up.  I will see her back in 3 months time with CBC with ferritin and iron studies CMP and CEA as well as repeat CT chest abdomen and pelvis with contrast  Patient's most recent CBC from 11/30/2018 did show that she had significant anemia with a hemoglobin of 8.8 and I suspect part of it is postoperative.  I will be checking a repeat CBC B12  folate CEA and CMP today.  She did have ferritin and iron studies checked on 11/30/2018 which showed a combination of anemia of chronic disease and possible component of iron deficiency.  We will have her take oral iron 325 mg twice a day at this time  Cancer Staging Malignant neoplasm of colon [facility]) Staging form: Colon and Rectum, AJCC 8th Edition - Pathologic stage from 12/19/2018: Stage IIIB (pT3, pN1a, cM0) - Signed by Sindy Guadeloupe, MD on 12/22/2018   Total face to face encounter time for this patient visit was 40 min. >50% of the time was  spent in counseling and coordination of care.     Thank you for this kind referral and the opportunity to participate in the care of this patient   Visit Diagnosis 1. Malignant neoplasm of ascending colon ([place])   2. Goals of care, counseling/discussion   3. Normocytic anemia     Dr. Randa Evens, MD, MPH [facility] [phone] 12/22/2018 10:36 AM

## 2018-12-23 ENCOUNTER — Telehealth: Payer: Self-pay | Admitting: Gastroenterology

## 2018-12-23 DIAGNOSIS — J40 Bronchitis, not specified as acute or chronic: Secondary | ICD-10-CM | POA: Diagnosis not present

## 2018-12-23 DIAGNOSIS — M7500 Adhesive capsulitis of unspecified shoulder: Secondary | ICD-10-CM | POA: Diagnosis not present

## 2018-12-23 DIAGNOSIS — M80062D Age-related osteoporosis with current pathological fracture, left lower leg, subsequent encounter for fracture with routine healing: Secondary | ICD-10-CM | POA: Diagnosis not present

## 2018-12-23 DIAGNOSIS — Z433 Encounter for attention to colostomy: Secondary | ICD-10-CM | POA: Diagnosis not present

## 2018-12-23 DIAGNOSIS — M4802 Spinal stenosis, cervical region: Secondary | ICD-10-CM | POA: Diagnosis not present

## 2018-12-23 DIAGNOSIS — M4692 Unspecified inflammatory spondylopathy, cervical region: Secondary | ICD-10-CM | POA: Diagnosis not present

## 2018-12-23 DIAGNOSIS — C187 Malignant neoplasm of sigmoid colon: Secondary | ICD-10-CM | POA: Diagnosis not present

## 2018-12-23 DIAGNOSIS — G894 Chronic pain syndrome: Secondary | ICD-10-CM | POA: Diagnosis not present

## 2018-12-23 DIAGNOSIS — Z85038 Personal history of other malignant neoplasm of large intestine: Secondary | ICD-10-CM | POA: Diagnosis not present

## 2018-12-23 DIAGNOSIS — Z4801 Encounter for change or removal of surgical wound dressing: Secondary | ICD-10-CM | POA: Diagnosis not present

## 2018-12-23 DIAGNOSIS — M80071D Age-related osteoporosis with current pathological fracture, right ankle and foot, subsequent encounter for fracture with routine healing: Secondary | ICD-10-CM | POA: Diagnosis not present

## 2018-12-23 DIAGNOSIS — I10 Essential (primary) hypertension: Secondary | ICD-10-CM | POA: Diagnosis not present

## 2018-12-23 DIAGNOSIS — M48061 Spinal stenosis, lumbar region without neurogenic claudication: Secondary | ICD-10-CM | POA: Diagnosis not present

## 2018-12-23 DIAGNOSIS — Z483 Aftercare following surgery for neoplasm: Secondary | ICD-10-CM | POA: Diagnosis not present

## 2018-12-23 DIAGNOSIS — M450 Ankylosing spondylitis of multiple sites in spine: Secondary | ICD-10-CM | POA: Diagnosis not present

## 2018-12-23 DIAGNOSIS — K219 Gastro-esophageal reflux disease without esophagitis: Secondary | ICD-10-CM | POA: Diagnosis not present

## 2018-12-23 DIAGNOSIS — Z9181 History of falling: Secondary | ICD-10-CM | POA: Diagnosis not present

## 2018-12-23 DIAGNOSIS — Z7901 Long term (current) use of anticoagulants: Secondary | ICD-10-CM | POA: Diagnosis not present

## 2018-12-23 DIAGNOSIS — Z87440 Personal history of urinary (tract) infections: Secondary | ICD-10-CM | POA: Diagnosis not present

## 2018-12-23 NOTE — Telephone Encounter (Signed)
Pt is scheduled for phone visit 12/29/18 she still needs a call from nurse regarding her pain please call pt

## 2018-12-23 NOTE — Telephone Encounter (Signed)
Please contact pt for a telemed visit this week per Dr. Bonna Gains.

## 2018-12-23 NOTE — Telephone Encounter (Signed)
Pt states she continues with abdominal pain (pain scale of 7), of which she states is the same but ongoing. Denies any diarrhea (colostomy), blood in stool, nausea or vomiting, fever. Pt c/o no appetite. When asked if she was taking anything for pain she said no. Was taking Oxycodone from previous surgery and Tylenol makes her stomach hurt. Pt is set up for telemed on 12/29/18. BP 170/60 today at home per patient. Please advise.

## 2018-12-23 NOTE — Telephone Encounter (Signed)
Pt called. See telephone encounter 12/23/2018.

## 2018-12-24 DIAGNOSIS — S82841S Displaced bimalleolar fracture of right lower leg, sequela: Secondary | ICD-10-CM | POA: Diagnosis not present

## 2018-12-25 ENCOUNTER — Ambulatory Visit: Payer: Medicare Other | Admitting: Gastroenterology

## 2018-12-25 ENCOUNTER — Ambulatory Visit (INDEPENDENT_AMBULATORY_CARE_PROVIDER_SITE_OTHER): Payer: Medicare Other | Admitting: Gastroenterology

## 2018-12-25 ENCOUNTER — Telehealth: Payer: Medicare Other | Admitting: Gastroenterology

## 2018-12-25 DIAGNOSIS — M80071D Age-related osteoporosis with current pathological fracture, right ankle and foot, subsequent encounter for fracture with routine healing: Secondary | ICD-10-CM | POA: Diagnosis not present

## 2018-12-25 DIAGNOSIS — Z4801 Encounter for change or removal of surgical wound dressing: Secondary | ICD-10-CM | POA: Diagnosis not present

## 2018-12-25 DIAGNOSIS — J40 Bronchitis, not specified as acute or chronic: Secondary | ICD-10-CM | POA: Diagnosis not present

## 2018-12-25 DIAGNOSIS — M80062D Age-related osteoporosis with current pathological fracture, left lower leg, subsequent encounter for fracture with routine healing: Secondary | ICD-10-CM | POA: Diagnosis not present

## 2018-12-25 DIAGNOSIS — C187 Malignant neoplasm of sigmoid colon: Secondary | ICD-10-CM | POA: Diagnosis not present

## 2018-12-25 DIAGNOSIS — Z85038 Personal history of other malignant neoplasm of large intestine: Secondary | ICD-10-CM | POA: Diagnosis not present

## 2018-12-25 DIAGNOSIS — Z483 Aftercare following surgery for neoplasm: Secondary | ICD-10-CM | POA: Diagnosis not present

## 2018-12-25 DIAGNOSIS — Z7901 Long term (current) use of anticoagulants: Secondary | ICD-10-CM | POA: Diagnosis not present

## 2018-12-25 DIAGNOSIS — M450 Ankylosing spondylitis of multiple sites in spine: Secondary | ICD-10-CM | POA: Diagnosis not present

## 2018-12-25 DIAGNOSIS — M4692 Unspecified inflammatory spondylopathy, cervical region: Secondary | ICD-10-CM | POA: Diagnosis not present

## 2018-12-25 DIAGNOSIS — I10 Essential (primary) hypertension: Secondary | ICD-10-CM | POA: Diagnosis not present

## 2018-12-25 DIAGNOSIS — Z9181 History of falling: Secondary | ICD-10-CM | POA: Diagnosis not present

## 2018-12-25 DIAGNOSIS — M48061 Spinal stenosis, lumbar region without neurogenic claudication: Secondary | ICD-10-CM | POA: Diagnosis not present

## 2018-12-25 DIAGNOSIS — K219 Gastro-esophageal reflux disease without esophagitis: Secondary | ICD-10-CM | POA: Diagnosis not present

## 2018-12-25 DIAGNOSIS — G894 Chronic pain syndrome: Secondary | ICD-10-CM | POA: Diagnosis not present

## 2018-12-25 DIAGNOSIS — M7500 Adhesive capsulitis of unspecified shoulder: Secondary | ICD-10-CM | POA: Diagnosis not present

## 2018-12-25 DIAGNOSIS — Z87440 Personal history of urinary (tract) infections: Secondary | ICD-10-CM | POA: Diagnosis not present

## 2018-12-25 DIAGNOSIS — Z433 Encounter for attention to colostomy: Secondary | ICD-10-CM | POA: Diagnosis not present

## 2018-12-25 DIAGNOSIS — M4802 Spinal stenosis, cervical region: Secondary | ICD-10-CM | POA: Diagnosis not present

## 2018-12-25 NOTE — Progress Notes (Signed)
Gloria Antigua, MD [address]  [place]  [place], [place] [address]  Main: [phone]  Fax: [phone]   Primary Care Physician: Inc, [facility]  Virtual Visit via Telephone Note  I connected with patient on 12/25/18 at 10:15 AM EDT by telephone and verified that I am speaking with the correct person using two identifiers.   I discussed the limitations, risks, security and privacy concerns of performing an evaluation and management service by telephone and the availability of in person appointments. I also discussed with the patient that there may be a patient responsible charge related to this service. The patient expressed understanding and agreed to proceed.  Location of Patient: Home Location of Provider: Home Persons involved: Patient and provider only   History of Present Illness: CC:  HPI: Gloria Allen is a 73 y.o. female with recent history of invasive adenocarcinoma of the sigmoid colon, status post surgical resection by Dr. Dahlia Byes, loop ileostomy creation.  Patient reports doing well post surgery.  This is a telephone visit so I am unable to examine her back, but patient states that she has nurses come to her house to check in on this about 3-4 times a week and they just checked it yesterday and have another visit today and they state that everything looks good as far as her back and ileostomy site.  She reports having yellow stool output from the ileostomy bag itself.  No red blood or black material in the back itself.  Patient denies any abdominal pain.  She states when her ileostomy is about to produce stool output within the bag itself, she can feel it coming, and that is the only sensation that she is describing.  She denies any pain with these episodes.  No nausea or vomiting.  No indigestion, no dysphagia, no heartburn.  No rectal output.  Denies any alarm symptoms whatsoever.  Has been following with surgery clinic after her surgery, and  oncology as well.  Current Outpatient Medications  Medication Sig Dispense Refill   albuterol (PROVENTIL HFA;VENTOLIN HFA) 108 (90 Base) MCG/ACT inhaler Inhale 2 puffs into the lungs every 6 (six) hours as needed for wheezing or shortness of breath. 1 Inhaler 3   ANORO ELLIPTA 62.5-25 MCG/INH AEPB INHALE 1 PUFF BY MOUTH DAILY 60 each 0   BREO ELLIPTA 200-25 MCG/INH AEPB Inhale 1 puff into the lungs daily.      clonazePAM (KLONOPIN) 0.5 MG tablet Take 0.5 mg by mouth 3 (three) times daily.      cloNIDine (CATAPRES) 0.3 MG tablet Take 2 tablets (0.6 mg total) by mouth 2 (two) times daily. (Patient taking differently: Take 0.3 mg by mouth 2 (two) times daily. ) 120 tablet 2   cyclobenzaprine (FLEXERIL) 5 MG tablet Take 5 mg by mouth at bedtime.     hydrALAZINE (APRESOLINE) 100 MG tablet Take 1 tablet (100 mg total) by mouth 3 (three) times daily. (Patient taking differently: Take 100 mg by mouth daily. ) 90 tablet 1   hydrOXYzine (ATARAX/VISTARIL) 10 MG tablet TAKE 1 TABLET BY MOUTH THREE TIMES A DAY AS NEEDED (Patient taking differently: Take 10 mg by mouth 3 (three) times daily as needed for itching. ) 90 tablet 0   lovastatin (MEVACOR) 40 MG tablet Take 1 tablet (40 mg total) by mouth at bedtime. 90 tablet 1   meclizine (ANTIVERT) 12.5 MG tablet Take 12.5 mg by mouth 3 (three) times daily as needed for dizziness.   0  methocarbamol (ROBAXIN) 500 MG tablet Take 1 tablet (500 mg total) by mouth at bedtime as needed for muscle spasms. 30 tablet 0   metoprolol succinate (TOPROL-XL) 100 MG 24 hr tablet TAKE 1 TABLET BY MOUTH DAILY 30 tablet 0   mirtazapine (REMERON) 15 MG tablet take 1 tablet by mouth at bedtime for APPETITIE (Patient taking differently: Take 15 mg by mouth at bedtime. take 1 tablet by mouth at bedtime for APPETITIE) 180 tablet 0   montelukast (SINGULAIR) 10 MG tablet Take 1 tablet (10 mg total) by mouth at bedtime as needed. 90 tablet 1   neomycin (MYCIFRADIN) 500 MG  tablet Take two tablets at 8 am, two tablets at 2 pm, and two tablets at 8 pm the day before surgery. 6 tablet 0   nitrofurantoin (MACRODANTIN) 100 MG capsule Take 100 mg by mouth daily.      omeprazole (PRILOSEC) 40 MG capsule Take 1 capsule (40 mg total) by mouth 2 (two) times daily. 60 capsule 2   oxybutynin (DITROPAN) 5 MG tablet TAKE 1 TABLET BY MOUTH TWICE DAILY (Patient taking differently: Take 5 mg by mouth 2 (two) times daily. ) 180 tablet 0   oxyCODONE (OXY IR/ROXICODONE) 5 MG immediate release tablet Take 1-2 tablets (5-10 mg total) by mouth every 4 (four) hours as needed for moderate pain. 30 tablet 0   polyethylene glycol powder (GLYCOLAX/MIRALAX) powder 255 grams one bottle for bowel prep 255 g 0   Potassium 99 MG TABS Take 1 tablet by mouth daily as needed (cramps).      sertraline (ZOLOFT) 100 MG tablet Take 2 tablets (200 mg total) by mouth at bedtime. 60 tablet 3   terazosin (HYTRIN) 5 MG capsule TAKE 1 CAPSULE BY MOUTH ONCE DAILY AT BEDTIME 30 capsule 0   traMADol (ULTRAM) 50 MG tablet Take 1 tablet (50 mg total) by mouth every 6 (six) hours. 30 tablet 1   traZODone (DESYREL) 50 MG tablet Take 0.5-1 tablets (25-50 mg total) by mouth at bedtime as needed for sleep. (Patient taking differently: Take 50 mg by mouth at bedtime. ) 30 tablet 3   triamcinolone cream (KENALOG) 0.1 % APPLY TOPICALLY TWICE DAILY AS NEEDED FOR ITCHING (Patient taking differently: Apply 1 application topically 2 (two) times daily as needed (itching). ) 30 g 0   bisacodyl (BISACODYL) 5 MG EC tablet Take 4 tablets (5 mg each) at 8 am the day before surgery. (Patient not taking: Reported on 12/25/2018) 4 tablet 0   enoxaparin (LOVENOX) 40 MG/0.4ML injection Inject 0.4 mLs (40 mg total) into the skin daily. (Patient not taking: Reported on 12/25/2018) 14 Syringe 0   Erythromycin 500 MG TBEC Take 500 mg by mouth as directed. Take two tablets at 8 am, two tablets at 2 pm, and two tablets at 8 pm the day  before surgery. (Patient not taking: Reported on 12/25/2018) 6 tablet 0   hydrALAZINE (APRESOLINE) 50 MG tablet TAKE 1 TABLET BY MOUTH THREE TIMES DAILY (Patient not taking: Reported on 12/25/2018) 90 tablet 0   metoprolol succinate (TOPROL-XL) 50 MG 24 hr tablet Take 50 mg by mouth daily. Take with or immediately following a meal.     No current facility-administered medications for this visit.     Allergies as of 12/25/2018 - Review Complete 12/25/2018  Allergen Reaction Noted   Ace inhibitors Swelling 06/20/2017   Angiotensin receptor blockers Swelling 06/20/2017   Sulfa antibiotics Itching 08/20/2018   Chlorthalidone Rash 04/22/2018   Flexeril [cyclobenzaprine] Rash 04/22/2018  Review of Systems:    All systems reviewed and negative except where noted in HPI.   Observations/Objective:  Labs: CMP     Component Value Date/Time   NA 138 12/19/2018 1200   NA 141 08/21/2018 1443   K 3.9 12/19/2018 1200   CL 105 12/19/2018 1200   CO2 25 12/19/2018 1200   GLUCOSE 126 (H) 12/19/2018 1200   BUN 9 12/19/2018 1200   BUN 11 08/21/2018 1443   CREATININE 0.62 12/19/2018 1200   CALCIUM 9.5 12/19/2018 1200   PROT 7.7 12/19/2018 1200   PROT 6.6 08/21/2018 1443   ALBUMIN 3.8 12/19/2018 1200   ALBUMIN 4.2 08/21/2018 1443   AST 21 12/19/2018 1200   ALT 17 12/19/2018 1200   ALKPHOS 109 12/19/2018 1200   BILITOT 0.3 12/19/2018 1200   BILITOT <0.2 08/21/2018 1443   GFRNONAA >60 12/19/2018 1200   GFRAA >60 12/19/2018 1200   Lab Results  Component Value Date   WBC 7.2 12/19/2018   HGB 10.5 (L) 12/19/2018   HCT 34.3 (L) 12/19/2018   MCV 86.0 12/19/2018   PLT 423 (H) 12/19/2018    Imaging Studies: Dg Chest Port 1 View  Result Date: 11/28/2018 CLINICAL DATA:  Dyspnea EXAM: PORTABLE CHEST 1 VIEW COMPARISON:  11/21/2018 FINDINGS: Cardiac shadow is stable. The lungs are clear bilaterally. No focal infiltrate or sizable effusion is seen. No bony abnormality is noted. IMPRESSION:  No active disease. Electronically Signed   By: Inez Catalina M.D.   On: 11/28/2018 13:23    Assessment and Plan:   Gloria Allen is a 73 y.o. y/o female with recent history of invasive adenocarcinoma of the sigmoid colon, status post resection and loop ileostomy creation by surgery, and has refused chemotherapy recommended by oncology  Assessment and Plan: Patient reports doing well post surgery Denies any abdominal pain whatsoever No alarm symptoms present  She states she is going to follow-up with surgery in regard to when her ileostomy takedown would occur  I have encouraged her to call us or surgery clinic with any changes in symptoms, changes in output from the ileostomy bag, or if her nurses that evaluate the bag have any questions or concerns.  She verbalized understanding and does not have any further questions for [place].    She will need a repeat colonoscopy in 1 year and she verbalized understanding of this as well.  Follow Up Instructions: Continue close follow-up with surgery clinic and oncology  Patient encouraged to call us with any changes in signs or symptoms or any concerns or questions.  Follow-up in clinic in 6 months.   I discussed the assessment and treatment plan with the patient. The patient was provided an opportunity to ask questions and all were answered. The patient agreed with the plan and demonstrated an understanding of the instructions.   The patient was advised to call back or seek an in-person evaluation if the symptoms worsen or if the condition fails to improve as anticipated.  I provided 7 minutes of non-face-to-face time during this encounter.   Virgel Manifold, MD  Speech recognition software was used to dictate this note.

## 2018-12-26 ENCOUNTER — Ambulatory Visit: Admission: RE | Admit: 2018-12-26 | Payer: Medicare Other | Source: Ambulatory Visit

## 2018-12-26 DIAGNOSIS — J40 Bronchitis, not specified as acute or chronic: Secondary | ICD-10-CM | POA: Diagnosis not present

## 2018-12-26 DIAGNOSIS — Z433 Encounter for attention to colostomy: Secondary | ICD-10-CM | POA: Diagnosis not present

## 2018-12-26 DIAGNOSIS — Z483 Aftercare following surgery for neoplasm: Secondary | ICD-10-CM | POA: Diagnosis not present

## 2018-12-26 DIAGNOSIS — M450 Ankylosing spondylitis of multiple sites in spine: Secondary | ICD-10-CM | POA: Diagnosis not present

## 2018-12-26 DIAGNOSIS — Z9181 History of falling: Secondary | ICD-10-CM | POA: Diagnosis not present

## 2018-12-26 DIAGNOSIS — C187 Malignant neoplasm of sigmoid colon: Secondary | ICD-10-CM | POA: Diagnosis not present

## 2018-12-26 DIAGNOSIS — Z87440 Personal history of urinary (tract) infections: Secondary | ICD-10-CM | POA: Diagnosis not present

## 2018-12-26 DIAGNOSIS — M7500 Adhesive capsulitis of unspecified shoulder: Secondary | ICD-10-CM | POA: Diagnosis not present

## 2018-12-26 DIAGNOSIS — Z7901 Long term (current) use of anticoagulants: Secondary | ICD-10-CM | POA: Diagnosis not present

## 2018-12-26 DIAGNOSIS — Z85038 Personal history of other malignant neoplasm of large intestine: Secondary | ICD-10-CM | POA: Diagnosis not present

## 2018-12-26 DIAGNOSIS — K219 Gastro-esophageal reflux disease without esophagitis: Secondary | ICD-10-CM | POA: Diagnosis not present

## 2018-12-26 DIAGNOSIS — M80071D Age-related osteoporosis with current pathological fracture, right ankle and foot, subsequent encounter for fracture with routine healing: Secondary | ICD-10-CM | POA: Diagnosis not present

## 2018-12-26 DIAGNOSIS — I10 Essential (primary) hypertension: Secondary | ICD-10-CM | POA: Diagnosis not present

## 2018-12-26 DIAGNOSIS — M80062D Age-related osteoporosis with current pathological fracture, left lower leg, subsequent encounter for fracture with routine healing: Secondary | ICD-10-CM | POA: Diagnosis not present

## 2018-12-26 DIAGNOSIS — Z4801 Encounter for change or removal of surgical wound dressing: Secondary | ICD-10-CM | POA: Diagnosis not present

## 2018-12-26 DIAGNOSIS — M4692 Unspecified inflammatory spondylopathy, cervical region: Secondary | ICD-10-CM | POA: Diagnosis not present

## 2018-12-26 DIAGNOSIS — G894 Chronic pain syndrome: Secondary | ICD-10-CM | POA: Diagnosis not present

## 2018-12-26 DIAGNOSIS — M48061 Spinal stenosis, lumbar region without neurogenic claudication: Secondary | ICD-10-CM | POA: Diagnosis not present

## 2018-12-26 DIAGNOSIS — M4802 Spinal stenosis, cervical region: Secondary | ICD-10-CM | POA: Diagnosis not present

## 2018-12-29 ENCOUNTER — Ambulatory Visit: Payer: Medicare Other | Admitting: Surgery

## 2018-12-29 ENCOUNTER — Other Ambulatory Visit: Payer: Self-pay

## 2018-12-29 ENCOUNTER — Telehealth: Payer: Medicare Other | Admitting: Gastroenterology

## 2018-12-29 ENCOUNTER — Encounter: Payer: Self-pay | Admitting: Surgery

## 2018-12-29 DIAGNOSIS — Z85038 Personal history of other malignant neoplasm of large intestine: Secondary | ICD-10-CM | POA: Diagnosis not present

## 2018-12-29 DIAGNOSIS — Z483 Aftercare following surgery for neoplasm: Secondary | ICD-10-CM | POA: Diagnosis not present

## 2018-12-29 DIAGNOSIS — M450 Ankylosing spondylitis of multiple sites in spine: Secondary | ICD-10-CM | POA: Diagnosis not present

## 2018-12-29 DIAGNOSIS — Z4801 Encounter for change or removal of surgical wound dressing: Secondary | ICD-10-CM | POA: Diagnosis not present

## 2018-12-29 DIAGNOSIS — M80071D Age-related osteoporosis with current pathological fracture, right ankle and foot, subsequent encounter for fracture with routine healing: Secondary | ICD-10-CM | POA: Diagnosis not present

## 2018-12-29 DIAGNOSIS — I10 Essential (primary) hypertension: Secondary | ICD-10-CM | POA: Diagnosis not present

## 2018-12-29 DIAGNOSIS — J40 Bronchitis, not specified as acute or chronic: Secondary | ICD-10-CM | POA: Diagnosis not present

## 2018-12-29 DIAGNOSIS — G894 Chronic pain syndrome: Secondary | ICD-10-CM | POA: Diagnosis not present

## 2018-12-29 DIAGNOSIS — Z87440 Personal history of urinary (tract) infections: Secondary | ICD-10-CM | POA: Diagnosis not present

## 2018-12-29 DIAGNOSIS — M4802 Spinal stenosis, cervical region: Secondary | ICD-10-CM | POA: Diagnosis not present

## 2018-12-29 DIAGNOSIS — M7500 Adhesive capsulitis of unspecified shoulder: Secondary | ICD-10-CM | POA: Diagnosis not present

## 2018-12-29 DIAGNOSIS — Z433 Encounter for attention to colostomy: Secondary | ICD-10-CM | POA: Diagnosis not present

## 2018-12-29 DIAGNOSIS — M48061 Spinal stenosis, lumbar region without neurogenic claudication: Secondary | ICD-10-CM | POA: Diagnosis not present

## 2018-12-29 DIAGNOSIS — Z9181 History of falling: Secondary | ICD-10-CM | POA: Diagnosis not present

## 2018-12-29 DIAGNOSIS — M80062D Age-related osteoporosis with current pathological fracture, left lower leg, subsequent encounter for fracture with routine healing: Secondary | ICD-10-CM | POA: Diagnosis not present

## 2018-12-29 DIAGNOSIS — C187 Malignant neoplasm of sigmoid colon: Secondary | ICD-10-CM | POA: Diagnosis not present

## 2018-12-29 DIAGNOSIS — M4692 Unspecified inflammatory spondylopathy, cervical region: Secondary | ICD-10-CM | POA: Diagnosis not present

## 2018-12-29 DIAGNOSIS — K219 Gastro-esophageal reflux disease without esophagitis: Secondary | ICD-10-CM | POA: Diagnosis not present

## 2018-12-29 DIAGNOSIS — Z7901 Long term (current) use of anticoagulants: Secondary | ICD-10-CM | POA: Diagnosis not present

## 2018-12-30 DIAGNOSIS — M7500 Adhesive capsulitis of unspecified shoulder: Secondary | ICD-10-CM | POA: Diagnosis not present

## 2018-12-30 DIAGNOSIS — I10 Essential (primary) hypertension: Secondary | ICD-10-CM | POA: Diagnosis not present

## 2018-12-30 DIAGNOSIS — Z87440 Personal history of urinary (tract) infections: Secondary | ICD-10-CM | POA: Diagnosis not present

## 2018-12-30 DIAGNOSIS — Z9181 History of falling: Secondary | ICD-10-CM | POA: Diagnosis not present

## 2018-12-30 DIAGNOSIS — Z483 Aftercare following surgery for neoplasm: Secondary | ICD-10-CM | POA: Diagnosis not present

## 2018-12-30 DIAGNOSIS — M80062D Age-related osteoporosis with current pathological fracture, left lower leg, subsequent encounter for fracture with routine healing: Secondary | ICD-10-CM | POA: Diagnosis not present

## 2018-12-30 DIAGNOSIS — M48061 Spinal stenosis, lumbar region without neurogenic claudication: Secondary | ICD-10-CM | POA: Diagnosis not present

## 2018-12-30 DIAGNOSIS — G894 Chronic pain syndrome: Secondary | ICD-10-CM | POA: Diagnosis not present

## 2018-12-30 DIAGNOSIS — M4802 Spinal stenosis, cervical region: Secondary | ICD-10-CM | POA: Diagnosis not present

## 2018-12-30 DIAGNOSIS — M80071D Age-related osteoporosis with current pathological fracture, right ankle and foot, subsequent encounter for fracture with routine healing: Secondary | ICD-10-CM | POA: Diagnosis not present

## 2018-12-30 DIAGNOSIS — Z85038 Personal history of other malignant neoplasm of large intestine: Secondary | ICD-10-CM | POA: Diagnosis not present

## 2018-12-30 DIAGNOSIS — K219 Gastro-esophageal reflux disease without esophagitis: Secondary | ICD-10-CM | POA: Diagnosis not present

## 2018-12-30 DIAGNOSIS — J40 Bronchitis, not specified as acute or chronic: Secondary | ICD-10-CM | POA: Diagnosis not present

## 2018-12-30 DIAGNOSIS — M4692 Unspecified inflammatory spondylopathy, cervical region: Secondary | ICD-10-CM | POA: Diagnosis not present

## 2018-12-30 DIAGNOSIS — M450 Ankylosing spondylitis of multiple sites in spine: Secondary | ICD-10-CM | POA: Diagnosis not present

## 2018-12-30 DIAGNOSIS — Z4801 Encounter for change or removal of surgical wound dressing: Secondary | ICD-10-CM | POA: Diagnosis not present

## 2018-12-30 DIAGNOSIS — Z7901 Long term (current) use of anticoagulants: Secondary | ICD-10-CM | POA: Diagnosis not present

## 2018-12-30 DIAGNOSIS — C187 Malignant neoplasm of sigmoid colon: Secondary | ICD-10-CM | POA: Diagnosis not present

## 2018-12-30 DIAGNOSIS — Z433 Encounter for attention to colostomy: Secondary | ICD-10-CM | POA: Diagnosis not present

## 2018-12-31 DIAGNOSIS — S82841D Displaced bimalleolar fracture of right lower leg, subsequent encounter for closed fracture with routine healing: Secondary | ICD-10-CM | POA: Diagnosis not present

## 2019-01-01 DIAGNOSIS — M4692 Unspecified inflammatory spondylopathy, cervical region: Secondary | ICD-10-CM | POA: Diagnosis not present

## 2019-01-01 DIAGNOSIS — M80062D Age-related osteoporosis with current pathological fracture, left lower leg, subsequent encounter for fracture with routine healing: Secondary | ICD-10-CM | POA: Diagnosis not present

## 2019-01-01 DIAGNOSIS — M7500 Adhesive capsulitis of unspecified shoulder: Secondary | ICD-10-CM | POA: Diagnosis not present

## 2019-01-01 DIAGNOSIS — M4802 Spinal stenosis, cervical region: Secondary | ICD-10-CM | POA: Diagnosis not present

## 2019-01-01 DIAGNOSIS — M80071D Age-related osteoporosis with current pathological fracture, right ankle and foot, subsequent encounter for fracture with routine healing: Secondary | ICD-10-CM | POA: Diagnosis not present

## 2019-01-01 DIAGNOSIS — C187 Malignant neoplasm of sigmoid colon: Secondary | ICD-10-CM | POA: Diagnosis not present

## 2019-01-01 DIAGNOSIS — J40 Bronchitis, not specified as acute or chronic: Secondary | ICD-10-CM | POA: Diagnosis not present

## 2019-01-01 DIAGNOSIS — Z7901 Long term (current) use of anticoagulants: Secondary | ICD-10-CM | POA: Diagnosis not present

## 2019-01-01 DIAGNOSIS — Z9181 History of falling: Secondary | ICD-10-CM | POA: Diagnosis not present

## 2019-01-01 DIAGNOSIS — Z483 Aftercare following surgery for neoplasm: Secondary | ICD-10-CM | POA: Diagnosis not present

## 2019-01-01 DIAGNOSIS — K219 Gastro-esophageal reflux disease without esophagitis: Secondary | ICD-10-CM | POA: Diagnosis not present

## 2019-01-01 DIAGNOSIS — G894 Chronic pain syndrome: Secondary | ICD-10-CM | POA: Diagnosis not present

## 2019-01-01 DIAGNOSIS — Z85038 Personal history of other malignant neoplasm of large intestine: Secondary | ICD-10-CM | POA: Diagnosis not present

## 2019-01-01 DIAGNOSIS — M450 Ankylosing spondylitis of multiple sites in spine: Secondary | ICD-10-CM | POA: Diagnosis not present

## 2019-01-01 DIAGNOSIS — Z433 Encounter for attention to colostomy: Secondary | ICD-10-CM | POA: Diagnosis not present

## 2019-01-01 DIAGNOSIS — Z87440 Personal history of urinary (tract) infections: Secondary | ICD-10-CM | POA: Diagnosis not present

## 2019-01-01 DIAGNOSIS — M48061 Spinal stenosis, lumbar region without neurogenic claudication: Secondary | ICD-10-CM | POA: Diagnosis not present

## 2019-01-01 DIAGNOSIS — Z4801 Encounter for change or removal of surgical wound dressing: Secondary | ICD-10-CM | POA: Diagnosis not present

## 2019-01-01 DIAGNOSIS — I10 Essential (primary) hypertension: Secondary | ICD-10-CM | POA: Diagnosis not present

## 2019-01-02 DIAGNOSIS — Z4801 Encounter for change or removal of surgical wound dressing: Secondary | ICD-10-CM | POA: Diagnosis not present

## 2019-01-02 DIAGNOSIS — M48061 Spinal stenosis, lumbar region without neurogenic claudication: Secondary | ICD-10-CM | POA: Diagnosis not present

## 2019-01-02 DIAGNOSIS — M4802 Spinal stenosis, cervical region: Secondary | ICD-10-CM | POA: Diagnosis not present

## 2019-01-02 DIAGNOSIS — M7500 Adhesive capsulitis of unspecified shoulder: Secondary | ICD-10-CM | POA: Diagnosis not present

## 2019-01-02 DIAGNOSIS — M80062D Age-related osteoporosis with current pathological fracture, left lower leg, subsequent encounter for fracture with routine healing: Secondary | ICD-10-CM | POA: Diagnosis not present

## 2019-01-02 DIAGNOSIS — I10 Essential (primary) hypertension: Secondary | ICD-10-CM | POA: Diagnosis not present

## 2019-01-02 DIAGNOSIS — C187 Malignant neoplasm of sigmoid colon: Secondary | ICD-10-CM | POA: Diagnosis not present

## 2019-01-02 DIAGNOSIS — Z87440 Personal history of urinary (tract) infections: Secondary | ICD-10-CM | POA: Diagnosis not present

## 2019-01-02 DIAGNOSIS — G894 Chronic pain syndrome: Secondary | ICD-10-CM | POA: Diagnosis not present

## 2019-01-02 DIAGNOSIS — K219 Gastro-esophageal reflux disease without esophagitis: Secondary | ICD-10-CM | POA: Diagnosis not present

## 2019-01-02 DIAGNOSIS — J40 Bronchitis, not specified as acute or chronic: Secondary | ICD-10-CM | POA: Diagnosis not present

## 2019-01-02 DIAGNOSIS — M4692 Unspecified inflammatory spondylopathy, cervical region: Secondary | ICD-10-CM | POA: Diagnosis not present

## 2019-01-02 DIAGNOSIS — M80071D Age-related osteoporosis with current pathological fracture, right ankle and foot, subsequent encounter for fracture with routine healing: Secondary | ICD-10-CM | POA: Diagnosis not present

## 2019-01-02 DIAGNOSIS — Z7901 Long term (current) use of anticoagulants: Secondary | ICD-10-CM | POA: Diagnosis not present

## 2019-01-02 DIAGNOSIS — M450 Ankylosing spondylitis of multiple sites in spine: Secondary | ICD-10-CM | POA: Diagnosis not present

## 2019-01-02 DIAGNOSIS — Z483 Aftercare following surgery for neoplasm: Secondary | ICD-10-CM | POA: Diagnosis not present

## 2019-01-02 DIAGNOSIS — Z433 Encounter for attention to colostomy: Secondary | ICD-10-CM | POA: Diagnosis not present

## 2019-01-02 DIAGNOSIS — Z85038 Personal history of other malignant neoplasm of large intestine: Secondary | ICD-10-CM | POA: Diagnosis not present

## 2019-01-02 DIAGNOSIS — Z9181 History of falling: Secondary | ICD-10-CM | POA: Diagnosis not present

## 2019-01-05 ENCOUNTER — Other Ambulatory Visit: Payer: Self-pay

## 2019-01-05 ENCOUNTER — Ambulatory Visit (INDEPENDENT_AMBULATORY_CARE_PROVIDER_SITE_OTHER): Payer: Medicare Other | Admitting: Surgery

## 2019-01-05 ENCOUNTER — Encounter: Payer: Self-pay | Admitting: Surgery

## 2019-01-05 VITALS — BP 144/70 | HR 68 | Temp 97.7°F | Ht 65.0 in | Wt 143.0 lb

## 2019-01-05 DIAGNOSIS — C187 Malignant neoplasm of sigmoid colon: Secondary | ICD-10-CM | POA: Diagnosis not present

## 2019-01-05 DIAGNOSIS — M4692 Unspecified inflammatory spondylopathy, cervical region: Secondary | ICD-10-CM | POA: Diagnosis not present

## 2019-01-05 DIAGNOSIS — M7500 Adhesive capsulitis of unspecified shoulder: Secondary | ICD-10-CM | POA: Diagnosis not present

## 2019-01-05 DIAGNOSIS — M48061 Spinal stenosis, lumbar region without neurogenic claudication: Secondary | ICD-10-CM | POA: Diagnosis not present

## 2019-01-05 DIAGNOSIS — K219 Gastro-esophageal reflux disease without esophagitis: Secondary | ICD-10-CM | POA: Diagnosis not present

## 2019-01-05 DIAGNOSIS — Z9181 History of falling: Secondary | ICD-10-CM | POA: Diagnosis not present

## 2019-01-05 DIAGNOSIS — Z483 Aftercare following surgery for neoplasm: Secondary | ICD-10-CM | POA: Diagnosis not present

## 2019-01-05 DIAGNOSIS — J40 Bronchitis, not specified as acute or chronic: Secondary | ICD-10-CM | POA: Diagnosis not present

## 2019-01-05 DIAGNOSIS — G894 Chronic pain syndrome: Secondary | ICD-10-CM | POA: Diagnosis not present

## 2019-01-05 DIAGNOSIS — Z4801 Encounter for change or removal of surgical wound dressing: Secondary | ICD-10-CM | POA: Diagnosis not present

## 2019-01-05 DIAGNOSIS — M4802 Spinal stenosis, cervical region: Secondary | ICD-10-CM | POA: Diagnosis not present

## 2019-01-05 DIAGNOSIS — Z433 Encounter for attention to colostomy: Secondary | ICD-10-CM | POA: Diagnosis not present

## 2019-01-05 DIAGNOSIS — Z85038 Personal history of other malignant neoplasm of large intestine: Secondary | ICD-10-CM | POA: Diagnosis not present

## 2019-01-05 DIAGNOSIS — M80062D Age-related osteoporosis with current pathological fracture, left lower leg, subsequent encounter for fracture with routine healing: Secondary | ICD-10-CM | POA: Diagnosis not present

## 2019-01-05 DIAGNOSIS — Z09 Encounter for follow-up examination after completed treatment for conditions other than malignant neoplasm: Secondary | ICD-10-CM

## 2019-01-05 DIAGNOSIS — M450 Ankylosing spondylitis of multiple sites in spine: Secondary | ICD-10-CM | POA: Diagnosis not present

## 2019-01-05 DIAGNOSIS — Z933 Colostomy status: Secondary | ICD-10-CM | POA: Diagnosis not present

## 2019-01-05 DIAGNOSIS — M80071D Age-related osteoporosis with current pathological fracture, right ankle and foot, subsequent encounter for fracture with routine healing: Secondary | ICD-10-CM | POA: Diagnosis not present

## 2019-01-05 DIAGNOSIS — Z87440 Personal history of urinary (tract) infections: Secondary | ICD-10-CM | POA: Diagnosis not present

## 2019-01-05 DIAGNOSIS — I10 Essential (primary) hypertension: Secondary | ICD-10-CM | POA: Diagnosis not present

## 2019-01-05 DIAGNOSIS — Z7901 Long term (current) use of anticoagulants: Secondary | ICD-10-CM | POA: Diagnosis not present

## 2019-01-05 MED ORDER — MEGESTROL ACETATE 400 MG/10ML PO SUSP: 400.0000 mg | Freq: Every day | ORAL | 0 refills | Status: DC

## 2019-01-05 MED ORDER — LOPERAMIDE HCL 2 MG PO CAPS: 2.0000 mg | ORAL_CAPSULE | Freq: Three times a day (TID) | ORAL | 0 refills | Status: DC

## 2019-01-05 NOTE — Patient Instructions (Addendum)
You may [employer] to obtain your [employer]. They will check your insurance, help you find the best fit among the supplies offered, and help you with delivery. They also have specialized ostomy nurses that may help you with any questions that you have regarding your Ostomy. Their number is: [phone] Option #2.   I you have a problem with them getting you ostomy supplies please contact us.

## 2019-01-05 NOTE — Progress Notes (Signed)
Outpatient Surgical Follow Up  01/05/2019  Gloria Allen is an 73 y.o. female.   Chief Complaint  Patient presents with  . Routine Post Op    HPI: Is post sigmoid cancer extending to the rectum.  Did have positive nodal disease.  Oncology has follow her up and given the current current situation and the lack of support patient is not interested in chemotherapy at this time. She is doing otherwise well but has decreased appetite.  No fevers no chills she is taking p.o. and her ileostomy is working.  Past Medical History:  Diagnosis Date  . Anxiety   . Arthritis   . Asthma   . Cervical central spinal stenosis (C3-C7) (worse at C4-5) 07/30/2017  . Cervical foraminal stenosis (C4-5 and C5-6) (Bilateral) (L>R) 07/30/2017  . Chronic lower extremity pain (Fourth Area of Pain) (Bilateral) (R>L) 06/11/2017  . Chronic neck pain (Primary Area of Pain) (Bilateral) (R>L) 06/11/2017  . Chronic sacroiliac joint pain (Bilateral) (R>L) 06/11/2017  . Chronic shoulder pain [facility] Area of Pain) (Right) 06/11/2017  . Chronic upper extremity pain (Fifth Area of Pain) (Bilateral) (R>L) 06/11/2017  . DDD (degenerative disc disease), cervical 07/30/2017  . DDD (degenerative disc disease), lumbar 07/30/2017  . Depression   . DISH (diffuse idiopathic skeletal hyperostosis) 07/30/2017  . Dyspnea    with exertion  . Entrapment syndrome 06/20/2017   2002 on R and 2010 on L  . Full thickness rotator cuff tear 06/12/2017  . GERD (gastroesophageal reflux disease)   . Grade 1 Anterolisthesis of L4 over L5 07/30/2017  . Headache   . Hx: UTI (urinary tract infection)   . Hypertension   . Inflammation of joint of shoulder region 01/15/2017  . Lumbar central spinal stenosis (L4-5) 07/30/2017  . Lumbar disc protrusion (Left: L5-S1) (Right: L1-2) 07/30/2017   L5-S1 left foraminal protrusion with L5 impingement. L1-2 right paracentral protrusion without impingement.  . Lumbar facet arthropathy (Bilateral) 07/30/2017  .  Lumbar facet syndrome (Bilateral) (R>L) 07/30/2017  . Osteoarthritis of shoulder (Right) 01/15/2017    Past Surgical History:  Procedure Laterality Date  . ABDOMINAL HYSTERECTOMY    . APPENDECTOMY    . BREAST REDUCTION SURGERY Bilateral   . COLONOSCOPY WITH PROPOFOL N/A 11/05/2018   Procedure: COLONOSCOPY WITH PROPOFOL;  Surgeon: Virgel Manifold, MD;  Location: [facility];  Service: Endoscopy;  Laterality: N/A;  . COLONOSCOPY WITH PROPOFOL N/A 11/06/2018   Procedure: COLONOSCOPY WITH PROPOFOL;  Surgeon: Virgel Manifold, MD;  Location: [facility];  Service: Endoscopy;  Laterality: N/A;  . DILATION AND CURETTAGE OF UTERUS    . ESOPHAGOGASTRODUODENOSCOPY (EGD) WITH PROPOFOL N/A 11/05/2018   Procedure: ESOPHAGOGASTRODUODENOSCOPY (EGD) WITH PROPOFOL;  Surgeon: Virgel Manifold, MD;  Location: [facility];  Service: Endoscopy;  Laterality: N/A;  . ILEOSTOMY Right 11/27/2018   Procedure: ILEOSTOMY;  Surgeon: Jules Husbands, MD;  Location: [facility] OR[facility];  Service: General;  Laterality: Right;  . LAPAROSCOPIC SIGMOID COLECTOMY N/A 11/27/2018   Procedure: LAPAROSCOPIC SIGMOID COLECTOMY;  Surgeon: Jules Husbands, MD;  Location: [facility];  Service: General;  Laterality: N/A;  . ORIF ANKLE FRACTURE Right 11/21/2018   Procedure: OPEN REDUCTION INTERNAL FIXATION (ORIF) ANKLE FRACTURE;  Surgeon: Corky Mull, MD;  Location: [facility];  Service: Orthopedics;  Laterality: Right;  . REDUCTION MAMMAPLASTY    . SHOULDER ARTHROSCOPY WITH OPEN ROTATOR CUFF REPAIR AND DISTAL CLAVICLE ACROMINECTOMY Right 03/26/2017   Procedure: right shoulder arthroscopy, arthroscopic subacromial decompression, distal clavicle excision, mini open rotator cuff  repair;  Surgeon: Thornton Park, MD;  Location: [facility] OR[facility];  Service: Orthopedics;  Laterality: Right;    Family History  Problem Relation Age of Onset  . Hypertension Mother   . Breast cancer Mother 34  . Breast cancer Sister 74    Social History:  reports  that she quit smoking about 10 years ago. Her smoking use included cigarettes. She smoked 1.00 pack per day. She has never used smokeless tobacco. She reports that she does not drink alcohol or use drugs.  Allergies:  Allergies  Allergen Reactions  . Ace Inhibitors Swelling  . Angiotensin Receptor Blockers Swelling  . Sulfa Antibiotics Itching  . Chlorthalidone Rash  . Flexeril [Cyclobenzaprine] Rash    Medications reviewed.    ROS Full ROS performed and is otherwise negative other than what is stated in HPI   BP (!) 144/70   Pulse 68   Temp 97.7 F (36.5 C) (Skin)   Ht 5\' 5"  (1.651 m)   Wt 143 lb (64.9 kg)   SpO2 97%   BMI 23.80 kg/m   Physical Exam NAD Abd: soft, NT, ileostomy in place.liquid output. Patent. No peritonitis Or infection    Assessment/Plan:   Is post low anterior resection and loop ileostomy.  We will start low-dose of Imodium and will also start some Megace to increase her appetite.  Discussed with patient detail that given current CABG 19 epidemic we will be in her best interest to delay his surgery at least 6 to 8 weeks.  I will have her see me back in 6 weeks in the office.  We will delay any elective imaging studies for now  Caroleen Hamman, MD [facility]

## 2019-01-06 DIAGNOSIS — M48061 Spinal stenosis, lumbar region without neurogenic claudication: Secondary | ICD-10-CM | POA: Diagnosis not present

## 2019-01-06 DIAGNOSIS — Z85038 Personal history of other malignant neoplasm of large intestine: Secondary | ICD-10-CM | POA: Diagnosis not present

## 2019-01-06 DIAGNOSIS — M4802 Spinal stenosis, cervical region: Secondary | ICD-10-CM | POA: Diagnosis not present

## 2019-01-06 DIAGNOSIS — J40 Bronchitis, not specified as acute or chronic: Secondary | ICD-10-CM | POA: Diagnosis not present

## 2019-01-06 DIAGNOSIS — G894 Chronic pain syndrome: Secondary | ICD-10-CM | POA: Diagnosis not present

## 2019-01-06 DIAGNOSIS — M4692 Unspecified inflammatory spondylopathy, cervical region: Secondary | ICD-10-CM | POA: Diagnosis not present

## 2019-01-06 DIAGNOSIS — Z87440 Personal history of urinary (tract) infections: Secondary | ICD-10-CM | POA: Diagnosis not present

## 2019-01-06 DIAGNOSIS — Z483 Aftercare following surgery for neoplasm: Secondary | ICD-10-CM | POA: Diagnosis not present

## 2019-01-06 DIAGNOSIS — C187 Malignant neoplasm of sigmoid colon: Secondary | ICD-10-CM | POA: Diagnosis not present

## 2019-01-06 DIAGNOSIS — M7500 Adhesive capsulitis of unspecified shoulder: Secondary | ICD-10-CM | POA: Diagnosis not present

## 2019-01-06 DIAGNOSIS — M450 Ankylosing spondylitis of multiple sites in spine: Secondary | ICD-10-CM | POA: Diagnosis not present

## 2019-01-06 DIAGNOSIS — K219 Gastro-esophageal reflux disease without esophagitis: Secondary | ICD-10-CM | POA: Diagnosis not present

## 2019-01-06 DIAGNOSIS — I10 Essential (primary) hypertension: Secondary | ICD-10-CM | POA: Diagnosis not present

## 2019-01-06 DIAGNOSIS — Z7901 Long term (current) use of anticoagulants: Secondary | ICD-10-CM | POA: Diagnosis not present

## 2019-01-06 DIAGNOSIS — M80071D Age-related osteoporosis with current pathological fracture, right ankle and foot, subsequent encounter for fracture with routine healing: Secondary | ICD-10-CM | POA: Diagnosis not present

## 2019-01-06 DIAGNOSIS — Z433 Encounter for attention to colostomy: Secondary | ICD-10-CM | POA: Diagnosis not present

## 2019-01-06 DIAGNOSIS — M80062D Age-related osteoporosis with current pathological fracture, left lower leg, subsequent encounter for fracture with routine healing: Secondary | ICD-10-CM | POA: Diagnosis not present

## 2019-01-06 DIAGNOSIS — Z4801 Encounter for change or removal of surgical wound dressing: Secondary | ICD-10-CM | POA: Diagnosis not present

## 2019-01-06 DIAGNOSIS — Z9181 History of falling: Secondary | ICD-10-CM | POA: Diagnosis not present

## 2019-01-07 DIAGNOSIS — M80062D Age-related osteoporosis with current pathological fracture, left lower leg, subsequent encounter for fracture with routine healing: Secondary | ICD-10-CM | POA: Diagnosis not present

## 2019-01-07 DIAGNOSIS — Z483 Aftercare following surgery for neoplasm: Secondary | ICD-10-CM | POA: Diagnosis not present

## 2019-01-07 DIAGNOSIS — M4692 Unspecified inflammatory spondylopathy, cervical region: Secondary | ICD-10-CM | POA: Diagnosis not present

## 2019-01-07 DIAGNOSIS — G894 Chronic pain syndrome: Secondary | ICD-10-CM | POA: Diagnosis not present

## 2019-01-07 DIAGNOSIS — C187 Malignant neoplasm of sigmoid colon: Secondary | ICD-10-CM | POA: Diagnosis not present

## 2019-01-07 DIAGNOSIS — Z9181 History of falling: Secondary | ICD-10-CM | POA: Diagnosis not present

## 2019-01-07 DIAGNOSIS — I10 Essential (primary) hypertension: Secondary | ICD-10-CM | POA: Diagnosis not present

## 2019-01-07 DIAGNOSIS — Z87440 Personal history of urinary (tract) infections: Secondary | ICD-10-CM | POA: Diagnosis not present

## 2019-01-07 DIAGNOSIS — M450 Ankylosing spondylitis of multiple sites in spine: Secondary | ICD-10-CM | POA: Diagnosis not present

## 2019-01-07 DIAGNOSIS — Z4801 Encounter for change or removal of surgical wound dressing: Secondary | ICD-10-CM | POA: Diagnosis not present

## 2019-01-07 DIAGNOSIS — Z433 Encounter for attention to colostomy: Secondary | ICD-10-CM | POA: Diagnosis not present

## 2019-01-07 DIAGNOSIS — Z7901 Long term (current) use of anticoagulants: Secondary | ICD-10-CM | POA: Diagnosis not present

## 2019-01-07 DIAGNOSIS — K219 Gastro-esophageal reflux disease without esophagitis: Secondary | ICD-10-CM | POA: Diagnosis not present

## 2019-01-07 DIAGNOSIS — M4802 Spinal stenosis, cervical region: Secondary | ICD-10-CM | POA: Diagnosis not present

## 2019-01-07 DIAGNOSIS — J40 Bronchitis, not specified as acute or chronic: Secondary | ICD-10-CM | POA: Diagnosis not present

## 2019-01-07 DIAGNOSIS — Z85038 Personal history of other malignant neoplasm of large intestine: Secondary | ICD-10-CM | POA: Diagnosis not present

## 2019-01-07 DIAGNOSIS — M80071D Age-related osteoporosis with current pathological fracture, right ankle and foot, subsequent encounter for fracture with routine healing: Secondary | ICD-10-CM | POA: Diagnosis not present

## 2019-01-07 DIAGNOSIS — M48061 Spinal stenosis, lumbar region without neurogenic claudication: Secondary | ICD-10-CM | POA: Diagnosis not present

## 2019-01-07 DIAGNOSIS — M7500 Adhesive capsulitis of unspecified shoulder: Secondary | ICD-10-CM | POA: Diagnosis not present

## 2019-01-08 DIAGNOSIS — Z85038 Personal history of other malignant neoplasm of large intestine: Secondary | ICD-10-CM | POA: Diagnosis not present

## 2019-01-08 DIAGNOSIS — K219 Gastro-esophageal reflux disease without esophagitis: Secondary | ICD-10-CM | POA: Diagnosis not present

## 2019-01-08 DIAGNOSIS — M7500 Adhesive capsulitis of unspecified shoulder: Secondary | ICD-10-CM | POA: Diagnosis not present

## 2019-01-08 DIAGNOSIS — M80062D Age-related osteoporosis with current pathological fracture, left lower leg, subsequent encounter for fracture with routine healing: Secondary | ICD-10-CM | POA: Diagnosis not present

## 2019-01-08 DIAGNOSIS — Z7901 Long term (current) use of anticoagulants: Secondary | ICD-10-CM | POA: Diagnosis not present

## 2019-01-08 DIAGNOSIS — M4692 Unspecified inflammatory spondylopathy, cervical region: Secondary | ICD-10-CM | POA: Diagnosis not present

## 2019-01-08 DIAGNOSIS — Z483 Aftercare following surgery for neoplasm: Secondary | ICD-10-CM | POA: Diagnosis not present

## 2019-01-08 DIAGNOSIS — M80071D Age-related osteoporosis with current pathological fracture, right ankle and foot, subsequent encounter for fracture with routine healing: Secondary | ICD-10-CM | POA: Diagnosis not present

## 2019-01-08 DIAGNOSIS — Z87440 Personal history of urinary (tract) infections: Secondary | ICD-10-CM | POA: Diagnosis not present

## 2019-01-08 DIAGNOSIS — I10 Essential (primary) hypertension: Secondary | ICD-10-CM | POA: Diagnosis not present

## 2019-01-08 DIAGNOSIS — Z4801 Encounter for change or removal of surgical wound dressing: Secondary | ICD-10-CM | POA: Diagnosis not present

## 2019-01-08 DIAGNOSIS — C187 Malignant neoplasm of sigmoid colon: Secondary | ICD-10-CM | POA: Diagnosis not present

## 2019-01-08 DIAGNOSIS — G894 Chronic pain syndrome: Secondary | ICD-10-CM | POA: Diagnosis not present

## 2019-01-08 DIAGNOSIS — Z9181 History of falling: Secondary | ICD-10-CM | POA: Diagnosis not present

## 2019-01-08 DIAGNOSIS — M48061 Spinal stenosis, lumbar region without neurogenic claudication: Secondary | ICD-10-CM | POA: Diagnosis not present

## 2019-01-08 DIAGNOSIS — M4802 Spinal stenosis, cervical region: Secondary | ICD-10-CM | POA: Diagnosis not present

## 2019-01-08 DIAGNOSIS — J40 Bronchitis, not specified as acute or chronic: Secondary | ICD-10-CM | POA: Diagnosis not present

## 2019-01-08 DIAGNOSIS — M450 Ankylosing spondylitis of multiple sites in spine: Secondary | ICD-10-CM | POA: Diagnosis not present

## 2019-01-08 DIAGNOSIS — Z433 Encounter for attention to colostomy: Secondary | ICD-10-CM | POA: Diagnosis not present

## 2019-01-13 DIAGNOSIS — Z87440 Personal history of urinary (tract) infections: Secondary | ICD-10-CM | POA: Diagnosis not present

## 2019-01-13 DIAGNOSIS — Z7901 Long term (current) use of anticoagulants: Secondary | ICD-10-CM | POA: Diagnosis not present

## 2019-01-13 DIAGNOSIS — M80062D Age-related osteoporosis with current pathological fracture, left lower leg, subsequent encounter for fracture with routine healing: Secondary | ICD-10-CM | POA: Diagnosis not present

## 2019-01-13 DIAGNOSIS — Z4801 Encounter for change or removal of surgical wound dressing: Secondary | ICD-10-CM | POA: Diagnosis not present

## 2019-01-13 DIAGNOSIS — Z85038 Personal history of other malignant neoplasm of large intestine: Secondary | ICD-10-CM | POA: Diagnosis not present

## 2019-01-13 DIAGNOSIS — M48061 Spinal stenosis, lumbar region without neurogenic claudication: Secondary | ICD-10-CM | POA: Diagnosis not present

## 2019-01-13 DIAGNOSIS — Z433 Encounter for attention to colostomy: Secondary | ICD-10-CM | POA: Diagnosis not present

## 2019-01-13 DIAGNOSIS — M7500 Adhesive capsulitis of unspecified shoulder: Secondary | ICD-10-CM | POA: Diagnosis not present

## 2019-01-13 DIAGNOSIS — Z9181 History of falling: Secondary | ICD-10-CM | POA: Diagnosis not present

## 2019-01-13 DIAGNOSIS — M80071D Age-related osteoporosis with current pathological fracture, right ankle and foot, subsequent encounter for fracture with routine healing: Secondary | ICD-10-CM | POA: Diagnosis not present

## 2019-01-13 DIAGNOSIS — M4802 Spinal stenosis, cervical region: Secondary | ICD-10-CM | POA: Diagnosis not present

## 2019-01-13 DIAGNOSIS — K219 Gastro-esophageal reflux disease without esophagitis: Secondary | ICD-10-CM | POA: Diagnosis not present

## 2019-01-13 DIAGNOSIS — I10 Essential (primary) hypertension: Secondary | ICD-10-CM | POA: Diagnosis not present

## 2019-01-13 DIAGNOSIS — M4692 Unspecified inflammatory spondylopathy, cervical region: Secondary | ICD-10-CM | POA: Diagnosis not present

## 2019-01-13 DIAGNOSIS — C187 Malignant neoplasm of sigmoid colon: Secondary | ICD-10-CM | POA: Diagnosis not present

## 2019-01-13 DIAGNOSIS — J40 Bronchitis, not specified as acute or chronic: Secondary | ICD-10-CM | POA: Diagnosis not present

## 2019-01-13 DIAGNOSIS — M450 Ankylosing spondylitis of multiple sites in spine: Secondary | ICD-10-CM | POA: Diagnosis not present

## 2019-01-13 DIAGNOSIS — Z483 Aftercare following surgery for neoplasm: Secondary | ICD-10-CM | POA: Diagnosis not present

## 2019-01-13 DIAGNOSIS — G894 Chronic pain syndrome: Secondary | ICD-10-CM | POA: Diagnosis not present

## 2019-01-15 DIAGNOSIS — J40 Bronchitis, not specified as acute or chronic: Secondary | ICD-10-CM | POA: Diagnosis not present

## 2019-01-15 DIAGNOSIS — Z433 Encounter for attention to colostomy: Secondary | ICD-10-CM | POA: Diagnosis not present

## 2019-01-15 DIAGNOSIS — M7500 Adhesive capsulitis of unspecified shoulder: Secondary | ICD-10-CM | POA: Diagnosis not present

## 2019-01-15 DIAGNOSIS — M4692 Unspecified inflammatory spondylopathy, cervical region: Secondary | ICD-10-CM | POA: Diagnosis not present

## 2019-01-15 DIAGNOSIS — Z87440 Personal history of urinary (tract) infections: Secondary | ICD-10-CM | POA: Diagnosis not present

## 2019-01-15 DIAGNOSIS — Z9181 History of falling: Secondary | ICD-10-CM | POA: Diagnosis not present

## 2019-01-15 DIAGNOSIS — K219 Gastro-esophageal reflux disease without esophagitis: Secondary | ICD-10-CM | POA: Diagnosis not present

## 2019-01-15 DIAGNOSIS — M80071D Age-related osteoporosis with current pathological fracture, right ankle and foot, subsequent encounter for fracture with routine healing: Secondary | ICD-10-CM | POA: Diagnosis not present

## 2019-01-15 DIAGNOSIS — M48061 Spinal stenosis, lumbar region without neurogenic claudication: Secondary | ICD-10-CM | POA: Diagnosis not present

## 2019-01-15 DIAGNOSIS — M80062D Age-related osteoporosis with current pathological fracture, left lower leg, subsequent encounter for fracture with routine healing: Secondary | ICD-10-CM | POA: Diagnosis not present

## 2019-01-15 DIAGNOSIS — G894 Chronic pain syndrome: Secondary | ICD-10-CM | POA: Diagnosis not present

## 2019-01-15 DIAGNOSIS — M450 Ankylosing spondylitis of multiple sites in spine: Secondary | ICD-10-CM | POA: Diagnosis not present

## 2019-01-15 DIAGNOSIS — Z7901 Long term (current) use of anticoagulants: Secondary | ICD-10-CM | POA: Diagnosis not present

## 2019-01-15 DIAGNOSIS — I10 Essential (primary) hypertension: Secondary | ICD-10-CM | POA: Diagnosis not present

## 2019-01-15 DIAGNOSIS — M4802 Spinal stenosis, cervical region: Secondary | ICD-10-CM | POA: Diagnosis not present

## 2019-01-15 DIAGNOSIS — Z85038 Personal history of other malignant neoplasm of large intestine: Secondary | ICD-10-CM | POA: Diagnosis not present

## 2019-01-16 DIAGNOSIS — Z933 Colostomy status: Secondary | ICD-10-CM | POA: Diagnosis not present

## 2019-01-19 DIAGNOSIS — C189 Malignant neoplasm of colon, unspecified: Secondary | ICD-10-CM | POA: Diagnosis not present

## 2019-01-19 DIAGNOSIS — Z433 Encounter for attention to colostomy: Secondary | ICD-10-CM | POA: Diagnosis not present

## 2019-01-19 DIAGNOSIS — K219 Gastro-esophageal reflux disease without esophagitis: Secondary | ICD-10-CM | POA: Diagnosis not present

## 2019-01-19 DIAGNOSIS — M4692 Unspecified inflammatory spondylopathy, cervical region: Secondary | ICD-10-CM | POA: Diagnosis not present

## 2019-01-19 DIAGNOSIS — M4802 Spinal stenosis, cervical region: Secondary | ICD-10-CM | POA: Diagnosis not present

## 2019-01-19 DIAGNOSIS — Z87440 Personal history of urinary (tract) infections: Secondary | ICD-10-CM | POA: Diagnosis not present

## 2019-01-19 DIAGNOSIS — J40 Bronchitis, not specified as acute or chronic: Secondary | ICD-10-CM | POA: Diagnosis not present

## 2019-01-19 DIAGNOSIS — M80071D Age-related osteoporosis with current pathological fracture, right ankle and foot, subsequent encounter for fracture with routine healing: Secondary | ICD-10-CM | POA: Diagnosis not present

## 2019-01-19 DIAGNOSIS — Z7901 Long term (current) use of anticoagulants: Secondary | ICD-10-CM | POA: Diagnosis not present

## 2019-01-19 DIAGNOSIS — M80062D Age-related osteoporosis with current pathological fracture, left lower leg, subsequent encounter for fracture with routine healing: Secondary | ICD-10-CM | POA: Diagnosis not present

## 2019-01-19 DIAGNOSIS — M48061 Spinal stenosis, lumbar region without neurogenic claudication: Secondary | ICD-10-CM | POA: Diagnosis not present

## 2019-01-19 DIAGNOSIS — G894 Chronic pain syndrome: Secondary | ICD-10-CM | POA: Diagnosis not present

## 2019-01-19 DIAGNOSIS — M450 Ankylosing spondylitis of multiple sites in spine: Secondary | ICD-10-CM | POA: Diagnosis not present

## 2019-01-19 DIAGNOSIS — M7500 Adhesive capsulitis of unspecified shoulder: Secondary | ICD-10-CM | POA: Diagnosis not present

## 2019-01-19 DIAGNOSIS — I10 Essential (primary) hypertension: Secondary | ICD-10-CM | POA: Diagnosis not present

## 2019-01-19 DIAGNOSIS — Z9181 History of falling: Secondary | ICD-10-CM | POA: Diagnosis not present

## 2019-01-19 DIAGNOSIS — Z85038 Personal history of other malignant neoplasm of large intestine: Secondary | ICD-10-CM | POA: Diagnosis not present

## 2019-01-21 ENCOUNTER — Ambulatory Visit
Admission: RE | Admit: 2019-01-21 | Discharge: 2019-01-21 | Disposition: A | Discharge status: Home / Self Care | Payer: Medicare Other | Source: Ambulatory Visit | Attending: Oncology | Admitting: Oncology

## 2019-01-21 ENCOUNTER — Other Ambulatory Visit: Payer: Self-pay

## 2019-01-21 DIAGNOSIS — M48061 Spinal stenosis, lumbar region without neurogenic claudication: Secondary | ICD-10-CM | POA: Diagnosis not present

## 2019-01-21 DIAGNOSIS — M80062D Age-related osteoporosis with current pathological fracture, left lower leg, subsequent encounter for fracture with routine healing: Secondary | ICD-10-CM | POA: Diagnosis not present

## 2019-01-21 DIAGNOSIS — M7500 Adhesive capsulitis of unspecified shoulder: Secondary | ICD-10-CM | POA: Diagnosis not present

## 2019-01-21 DIAGNOSIS — M4692 Unspecified inflammatory spondylopathy, cervical region: Secondary | ICD-10-CM | POA: Diagnosis not present

## 2019-01-21 DIAGNOSIS — G894 Chronic pain syndrome: Secondary | ICD-10-CM | POA: Diagnosis not present

## 2019-01-21 DIAGNOSIS — Z9181 History of falling: Secondary | ICD-10-CM | POA: Diagnosis not present

## 2019-01-21 DIAGNOSIS — Z87440 Personal history of urinary (tract) infections: Secondary | ICD-10-CM | POA: Diagnosis not present

## 2019-01-21 DIAGNOSIS — Z85038 Personal history of other malignant neoplasm of large intestine: Secondary | ICD-10-CM | POA: Diagnosis not present

## 2019-01-21 DIAGNOSIS — K219 Gastro-esophageal reflux disease without esophagitis: Secondary | ICD-10-CM | POA: Diagnosis not present

## 2019-01-21 DIAGNOSIS — M80071D Age-related osteoporosis with current pathological fracture, right ankle and foot, subsequent encounter for fracture with routine healing: Secondary | ICD-10-CM | POA: Diagnosis not present

## 2019-01-21 DIAGNOSIS — M4802 Spinal stenosis, cervical region: Secondary | ICD-10-CM | POA: Diagnosis not present

## 2019-01-21 DIAGNOSIS — C187 Malignant neoplasm of sigmoid colon: Secondary | ICD-10-CM

## 2019-01-21 DIAGNOSIS — I10 Essential (primary) hypertension: Secondary | ICD-10-CM | POA: Diagnosis not present

## 2019-01-21 DIAGNOSIS — C189 Malignant neoplasm of colon, unspecified: Secondary | ICD-10-CM | POA: Diagnosis not present

## 2019-01-21 DIAGNOSIS — J40 Bronchitis, not specified as acute or chronic: Secondary | ICD-10-CM | POA: Diagnosis not present

## 2019-01-21 DIAGNOSIS — M450 Ankylosing spondylitis of multiple sites in spine: Secondary | ICD-10-CM | POA: Diagnosis not present

## 2019-01-21 DIAGNOSIS — Z7901 Long term (current) use of anticoagulants: Secondary | ICD-10-CM | POA: Diagnosis not present

## 2019-01-21 DIAGNOSIS — Z433 Encounter for attention to colostomy: Secondary | ICD-10-CM | POA: Diagnosis not present

## 2019-01-21 HISTORY — DX: Malignant (primary) neoplasm, unspecified: C80.1

## 2019-01-21 MED ORDER — IOHEXOL 300 MG/ML  SOLN
75.0000 mL | Freq: Once | INTRAMUSCULAR | Status: AC | PRN
  Administered 2019-01-21: 75 mL via INTRAVENOUS

## 2019-01-22 ENCOUNTER — Ambulatory Visit (INDEPENDENT_AMBULATORY_CARE_PROVIDER_SITE_OTHER): Payer: Medicare Other | Admitting: Gastroenterology

## 2019-01-22 ENCOUNTER — Telehealth: Payer: Self-pay | Admitting: *Deleted

## 2019-01-22 DIAGNOSIS — I10 Essential (primary) hypertension: Secondary | ICD-10-CM | POA: Diagnosis not present

## 2019-01-22 DIAGNOSIS — R918 Other nonspecific abnormal finding of lung field: Secondary | ICD-10-CM

## 2019-01-22 DIAGNOSIS — G894 Chronic pain syndrome: Secondary | ICD-10-CM | POA: Diagnosis not present

## 2019-01-22 DIAGNOSIS — M80071D Age-related osteoporosis with current pathological fracture, right ankle and foot, subsequent encounter for fracture with routine healing: Secondary | ICD-10-CM | POA: Diagnosis not present

## 2019-01-22 DIAGNOSIS — C187 Malignant neoplasm of sigmoid colon: Secondary | ICD-10-CM

## 2019-01-22 DIAGNOSIS — J40 Bronchitis, not specified as acute or chronic: Secondary | ICD-10-CM | POA: Diagnosis not present

## 2019-01-22 DIAGNOSIS — Z7901 Long term (current) use of anticoagulants: Secondary | ICD-10-CM | POA: Diagnosis not present

## 2019-01-22 DIAGNOSIS — M7500 Adhesive capsulitis of unspecified shoulder: Secondary | ICD-10-CM | POA: Diagnosis not present

## 2019-01-22 DIAGNOSIS — M4802 Spinal stenosis, cervical region: Secondary | ICD-10-CM | POA: Diagnosis not present

## 2019-01-22 DIAGNOSIS — Z433 Encounter for attention to colostomy: Secondary | ICD-10-CM | POA: Diagnosis not present

## 2019-01-22 DIAGNOSIS — M48061 Spinal stenosis, lumbar region without neurogenic claudication: Secondary | ICD-10-CM | POA: Diagnosis not present

## 2019-01-22 DIAGNOSIS — M4692 Unspecified inflammatory spondylopathy, cervical region: Secondary | ICD-10-CM | POA: Diagnosis not present

## 2019-01-22 DIAGNOSIS — M80062D Age-related osteoporosis with current pathological fracture, left lower leg, subsequent encounter for fracture with routine healing: Secondary | ICD-10-CM | POA: Diagnosis not present

## 2019-01-22 DIAGNOSIS — K219 Gastro-esophageal reflux disease without esophagitis: Secondary | ICD-10-CM | POA: Diagnosis not present

## 2019-01-22 DIAGNOSIS — Z9181 History of falling: Secondary | ICD-10-CM | POA: Diagnosis not present

## 2019-01-22 DIAGNOSIS — Z85038 Personal history of other malignant neoplasm of large intestine: Secondary | ICD-10-CM | POA: Diagnosis not present

## 2019-01-22 DIAGNOSIS — M450 Ankylosing spondylitis of multiple sites in spine: Secondary | ICD-10-CM | POA: Diagnosis not present

## 2019-01-22 DIAGNOSIS — Z87440 Personal history of urinary (tract) infections: Secondary | ICD-10-CM | POA: Diagnosis not present

## 2019-01-22 NOTE — Telephone Encounter (Signed)
Called pt to ask if we can make video visit this afternoon to go over ct results. Left this message to patient voicemail and asked her to call me directly to discuss this

## 2019-01-23 DIAGNOSIS — Z7901 Long term (current) use of anticoagulants: Secondary | ICD-10-CM | POA: Diagnosis not present

## 2019-01-23 DIAGNOSIS — M7500 Adhesive capsulitis of unspecified shoulder: Secondary | ICD-10-CM | POA: Diagnosis not present

## 2019-01-23 DIAGNOSIS — M450 Ankylosing spondylitis of multiple sites in spine: Secondary | ICD-10-CM | POA: Diagnosis not present

## 2019-01-23 DIAGNOSIS — M80071D Age-related osteoporosis with current pathological fracture, right ankle and foot, subsequent encounter for fracture with routine healing: Secondary | ICD-10-CM | POA: Diagnosis not present

## 2019-01-23 DIAGNOSIS — Z87440 Personal history of urinary (tract) infections: Secondary | ICD-10-CM | POA: Diagnosis not present

## 2019-01-23 DIAGNOSIS — Z85038 Personal history of other malignant neoplasm of large intestine: Secondary | ICD-10-CM | POA: Diagnosis not present

## 2019-01-23 DIAGNOSIS — J40 Bronchitis, not specified as acute or chronic: Secondary | ICD-10-CM | POA: Diagnosis not present

## 2019-01-23 DIAGNOSIS — Z433 Encounter for attention to colostomy: Secondary | ICD-10-CM | POA: Diagnosis not present

## 2019-01-23 DIAGNOSIS — Z9181 History of falling: Secondary | ICD-10-CM | POA: Diagnosis not present

## 2019-01-23 DIAGNOSIS — I10 Essential (primary) hypertension: Secondary | ICD-10-CM | POA: Diagnosis not present

## 2019-01-23 DIAGNOSIS — M4692 Unspecified inflammatory spondylopathy, cervical region: Secondary | ICD-10-CM | POA: Diagnosis not present

## 2019-01-23 DIAGNOSIS — M80062D Age-related osteoporosis with current pathological fracture, left lower leg, subsequent encounter for fracture with routine healing: Secondary | ICD-10-CM | POA: Diagnosis not present

## 2019-01-23 DIAGNOSIS — M48061 Spinal stenosis, lumbar region without neurogenic claudication: Secondary | ICD-10-CM | POA: Diagnosis not present

## 2019-01-23 DIAGNOSIS — K219 Gastro-esophageal reflux disease without esophagitis: Secondary | ICD-10-CM | POA: Diagnosis not present

## 2019-01-23 DIAGNOSIS — M4802 Spinal stenosis, cervical region: Secondary | ICD-10-CM | POA: Diagnosis not present

## 2019-01-23 DIAGNOSIS — G894 Chronic pain syndrome: Secondary | ICD-10-CM | POA: Diagnosis not present

## 2019-01-24 DIAGNOSIS — S82841S Displaced bimalleolar fracture of right lower leg, sequela: Secondary | ICD-10-CM | POA: Diagnosis not present

## 2019-01-26 ENCOUNTER — Other Ambulatory Visit: Payer: Self-pay

## 2019-01-26 ENCOUNTER — Inpatient Hospital Stay: Payer: Medicare Other | Attending: Oncology | Admitting: Oncology

## 2019-01-26 ENCOUNTER — Encounter: Payer: Self-pay | Admitting: Oncology

## 2019-01-26 DIAGNOSIS — Z433 Encounter for attention to colostomy: Secondary | ICD-10-CM | POA: Diagnosis not present

## 2019-01-26 DIAGNOSIS — M48061 Spinal stenosis, lumbar region without neurogenic claudication: Secondary | ICD-10-CM | POA: Diagnosis not present

## 2019-01-26 DIAGNOSIS — M450 Ankylosing spondylitis of multiple sites in spine: Secondary | ICD-10-CM | POA: Diagnosis not present

## 2019-01-26 DIAGNOSIS — Z9181 History of falling: Secondary | ICD-10-CM | POA: Diagnosis not present

## 2019-01-26 DIAGNOSIS — C189 Malignant neoplasm of colon, unspecified: Secondary | ICD-10-CM

## 2019-01-26 DIAGNOSIS — Z87440 Personal history of urinary (tract) infections: Secondary | ICD-10-CM | POA: Diagnosis not present

## 2019-01-26 DIAGNOSIS — K219 Gastro-esophageal reflux disease without esophagitis: Secondary | ICD-10-CM | POA: Diagnosis not present

## 2019-01-26 DIAGNOSIS — J40 Bronchitis, not specified as acute or chronic: Secondary | ICD-10-CM | POA: Diagnosis not present

## 2019-01-26 DIAGNOSIS — Z85038 Personal history of other malignant neoplasm of large intestine: Secondary | ICD-10-CM | POA: Diagnosis not present

## 2019-01-26 DIAGNOSIS — M80062D Age-related osteoporosis with current pathological fracture, left lower leg, subsequent encounter for fracture with routine healing: Secondary | ICD-10-CM | POA: Diagnosis not present

## 2019-01-26 DIAGNOSIS — R918 Other nonspecific abnormal finding of lung field: Secondary | ICD-10-CM

## 2019-01-26 DIAGNOSIS — I10 Essential (primary) hypertension: Secondary | ICD-10-CM | POA: Diagnosis not present

## 2019-01-26 DIAGNOSIS — M80071D Age-related osteoporosis with current pathological fracture, right ankle and foot, subsequent encounter for fracture with routine healing: Secondary | ICD-10-CM | POA: Diagnosis not present

## 2019-01-26 DIAGNOSIS — M4692 Unspecified inflammatory spondylopathy, cervical region: Secondary | ICD-10-CM | POA: Diagnosis not present

## 2019-01-26 DIAGNOSIS — Z7901 Long term (current) use of anticoagulants: Secondary | ICD-10-CM | POA: Diagnosis not present

## 2019-01-26 DIAGNOSIS — M7500 Adhesive capsulitis of unspecified shoulder: Secondary | ICD-10-CM | POA: Diagnosis not present

## 2019-01-26 DIAGNOSIS — M4802 Spinal stenosis, cervical region: Secondary | ICD-10-CM | POA: Diagnosis not present

## 2019-01-26 DIAGNOSIS — G894 Chronic pain syndrome: Secondary | ICD-10-CM | POA: Diagnosis not present

## 2019-01-26 NOTE — Progress Notes (Signed)
md to go over scans and get plan for future

## 2019-01-27 DIAGNOSIS — Z7901 Long term (current) use of anticoagulants: Secondary | ICD-10-CM | POA: Diagnosis not present

## 2019-01-27 DIAGNOSIS — M80062D Age-related osteoporosis with current pathological fracture, left lower leg, subsequent encounter for fracture with routine healing: Secondary | ICD-10-CM | POA: Diagnosis not present

## 2019-01-27 DIAGNOSIS — Z433 Encounter for attention to colostomy: Secondary | ICD-10-CM | POA: Diagnosis not present

## 2019-01-27 DIAGNOSIS — M450 Ankylosing spondylitis of multiple sites in spine: Secondary | ICD-10-CM | POA: Diagnosis not present

## 2019-01-27 DIAGNOSIS — K219 Gastro-esophageal reflux disease without esophagitis: Secondary | ICD-10-CM | POA: Diagnosis not present

## 2019-01-27 DIAGNOSIS — Z85038 Personal history of other malignant neoplasm of large intestine: Secondary | ICD-10-CM | POA: Diagnosis not present

## 2019-01-27 DIAGNOSIS — I10 Essential (primary) hypertension: Secondary | ICD-10-CM | POA: Diagnosis not present

## 2019-01-27 DIAGNOSIS — M7500 Adhesive capsulitis of unspecified shoulder: Secondary | ICD-10-CM | POA: Diagnosis not present

## 2019-01-27 DIAGNOSIS — Z87440 Personal history of urinary (tract) infections: Secondary | ICD-10-CM | POA: Diagnosis not present

## 2019-01-27 DIAGNOSIS — M80071D Age-related osteoporosis with current pathological fracture, right ankle and foot, subsequent encounter for fracture with routine healing: Secondary | ICD-10-CM | POA: Diagnosis not present

## 2019-01-27 DIAGNOSIS — M48061 Spinal stenosis, lumbar region without neurogenic claudication: Secondary | ICD-10-CM | POA: Diagnosis not present

## 2019-01-27 DIAGNOSIS — Z9181 History of falling: Secondary | ICD-10-CM | POA: Diagnosis not present

## 2019-01-27 DIAGNOSIS — M4692 Unspecified inflammatory spondylopathy, cervical region: Secondary | ICD-10-CM | POA: Diagnosis not present

## 2019-01-27 DIAGNOSIS — G894 Chronic pain syndrome: Secondary | ICD-10-CM | POA: Diagnosis not present

## 2019-01-27 DIAGNOSIS — M4802 Spinal stenosis, cervical region: Secondary | ICD-10-CM | POA: Diagnosis not present

## 2019-01-27 DIAGNOSIS — J40 Bronchitis, not specified as acute or chronic: Secondary | ICD-10-CM | POA: Diagnosis not present

## 2019-01-27 NOTE — Progress Notes (Signed)
I connected with Gloria Allen on 01/27/19 at  9:15 AM EDT by telephone visit and verified that I am speaking with the correct person using two identifiers.   I discussed the limitations, risks, security and privacy concerns of performing an evaluation and management service by telemedicine and the availability of in-person appointments. I also discussed with the patient that there may be a patient responsible charge related to this service. The patient expressed understanding and agreed to proceed.  Other persons participating in the visit and their role in the encounter:  none  Patient's location:  home Provider's location:  [facility]  Diagnosis: invasive adenocarcinoma of the colon stage III BpT3 pN1a cMx status post hemicolectomy  Chief Complaint:  Discuss ct chest results  History of present illness: patient is a 73 year old African-American female who recently underwent screening colonoscopy by Dr. Bonna Gains. Colonoscopy showed villous nonobstructing large mass in the sigmoid colon 12 cm proximal to the anus.  Mass was partially circumferential.  Biopsy showed invasive adenocarcinoma.  Patient was seen by Dr. Adora Fridge and underwent hemicolectomy with lymph node sampling on 11/27/2018.  Final pathology showed invasive colorectal carcinoma 3.5 cm, grade 2.  Tumor invades through muscularis propria into the peri-colorectal tissue.  Margins negative.  Tumor deposits present, 2.  1 out of 9 lymph nodes examined was positive for malignancy.  PT3PN1A  Patient lives alone and has no social support.  Reports that her children are around [place] but are not able to take care of her.  She still reports feeling fatigued and has trouble getting around the house.  She has a colostomy in place.  She also had fracture of her right ankle which limits her mobility and she continues to have pain in that area  Interval history: Patient reports she is doing better since her last visit in March 2020.   She continues to live alone but has not had any falls.  Appetite and weight has remained stable.  She does still have pain in her right ankle and some limited mobility secondary to it.   Review of Systems  Constitutional: Positive for malaise/fatigue. Negative for chills, fever and weight loss.  HENT: Negative for congestion, ear discharge and nosebleeds.   Eyes: Negative for blurred vision.  Respiratory: Negative for cough, hemoptysis, sputum production, shortness of breath and wheezing.   Cardiovascular: Negative for chest pain, palpitations, orthopnea and claudication.  Gastrointestinal: Negative for abdominal pain, blood in stool, constipation, diarrhea, heartburn, melena, nausea and vomiting.  Genitourinary: Negative for dysuria, flank pain, frequency, hematuria and urgency.  Musculoskeletal: Negative for back pain, joint pain and myalgias.  Skin: Negative for rash.  Neurological: Negative for dizziness, tingling, focal weakness, seizures, weakness and headaches.  Endo/Heme/Allergies: Does not bruise/bleed easily.  Psychiatric/Behavioral: Negative for depression and suicidal ideas. The patient does not have insomnia.     Allergies  Allergen Reactions  . Ace Inhibitors Swelling  . Angiotensin Receptor Blockers Swelling  . Sulfa Antibiotics Itching  . Chlorthalidone Rash  . Flexeril [Cyclobenzaprine] Rash    Past Medical History:  Diagnosis Date  . Anxiety   . Arthritis   . Asthma   . Cancer ([place])   . Cervical central spinal stenosis (C3-C7) (worse at C4-5) 07/30/2017  . Cervical foraminal stenosis (C4-5 and C5-6) (Bilateral) (L>R) 07/30/2017  . Chronic lower extremity pain (Fourth Area of Pain) (Bilateral) (R>L) 06/11/2017  . Chronic neck pain (Primary Area of Pain) (Bilateral) (R>L) 06/11/2017  . Chronic sacroiliac joint pain (Bilateral) (R>L)  06/11/2017  . Chronic shoulder pain [facility] Area of Pain) (Right) 06/11/2017  . Chronic upper extremity pain (Fifth Area of Pain)  (Bilateral) (R>L) 06/11/2017  . Colon cancer ([place])   . DDD (degenerative disc disease), cervical 07/30/2017  . DDD (degenerative disc disease), lumbar 07/30/2017  . Depression   . DISH (diffuse idiopathic skeletal hyperostosis) 07/30/2017  . Dyspnea    with exertion  . Entrapment syndrome 06/20/2017   2002 on R and 2010 on L  . Full thickness rotator cuff tear 06/12/2017  . GERD (gastroesophageal reflux disease)   . Grade 1 Anterolisthesis of L4 over L5 07/30/2017  . Headache   . Hx: UTI (urinary tract infection)   . Hypertension   . Inflammation of joint of shoulder region 01/15/2017  . Lumbar central spinal stenosis (L4-5) 07/30/2017  . Lumbar disc protrusion (Left: L5-S1) (Right: L1-2) 07/30/2017   L5-S1 left foraminal protrusion with L5 impingement. L1-2 right paracentral protrusion without impingement.  . Lumbar facet arthropathy (Bilateral) 07/30/2017  . Lumbar facet syndrome (Bilateral) (R>L) 07/30/2017  . Osteoarthritis of shoulder (Right) 01/15/2017    Past Surgical History:  Procedure Laterality Date  . ABDOMINAL HYSTERECTOMY    . APPENDECTOMY    . BREAST REDUCTION SURGERY Bilateral   . COLONOSCOPY WITH PROPOFOL N/A 11/05/2018   Procedure: COLONOSCOPY WITH PROPOFOL;  Surgeon: Virgel Manifold, MD;  Location: [facility];  Service: Endoscopy;  Laterality: N/A;  . COLONOSCOPY WITH PROPOFOL N/A 11/06/2018   Procedure: COLONOSCOPY WITH PROPOFOL;  Surgeon: Virgel Manifold, MD;  Location: [facility];  Service: Endoscopy;  Laterality: N/A;  . DILATION AND CURETTAGE OF UTERUS    . ESOPHAGOGASTRODUODENOSCOPY (EGD) WITH PROPOFOL N/A 11/05/2018   Procedure: ESOPHAGOGASTRODUODENOSCOPY (EGD) WITH PROPOFOL;  Surgeon: Virgel Manifold, MD;  Location: [facility];  Service: Endoscopy;  Laterality: N/A;  . ILEOSTOMY Right 11/27/2018   Procedure: ILEOSTOMY;  Surgeon: Jules Husbands, MD;  Location: [facility] OR[facility];  Service: General;  Laterality: Right;  . LAPAROSCOPIC SIGMOID  COLECTOMY N/A 11/27/2018   Procedure: LAPAROSCOPIC SIGMOID COLECTOMY;  Surgeon: Jules Husbands, MD;  Location: [facility] OR[facility];  Service: General;  Laterality: N/A;  . ORIF ANKLE FRACTURE Right 11/21/2018   Procedure: OPEN REDUCTION INTERNAL FIXATION (ORIF) ANKLE FRACTURE;  Surgeon: Corky Mull, MD;  Location: [facility] OR[facility];  Service: Orthopedics;  Laterality: Right;  . REDUCTION MAMMAPLASTY    . SHOULDER ARTHROSCOPY WITH OPEN ROTATOR CUFF REPAIR AND DISTAL CLAVICLE ACROMINECTOMY Right 03/26/2017   Procedure: right shoulder arthroscopy, arthroscopic subacromial decompression, distal clavicle excision, mini open rotator cuff repair;  Surgeon: Thornton Park, MD;  Location: [facility];  Service: Orthopedics;  Laterality: Right;    Social History   Socioeconomic History  . Marital status: Divorced    Spouse name: Not on file  . Number of children: 3  . Years of education: Not on file  . Highest education level: 11th grade  Occupational History  . Not on file  Social Needs  . Financial resource strain: Somewhat hard  . Food insecurity:    Worry: Sometimes true    Inability: Never true  . Transportation needs:    Medical: Yes    Non-medical: No  Tobacco Use  . Smoking status: Former Smoker    Packs/day: 1.00    Types: Cigarettes    Last attempt to quit: 2010    Years since quitting: 10.3  . Smokeless tobacco: Never Used  . Tobacco comment: quit over 30 years ago   Substance and Sexual Activity  .  Alcohol use: No  . Drug use: No  . Sexual activity: Never  Lifestyle  . Physical activity:    Days per week: 0 days    Minutes per session: 0 min  . Stress: Not at all  Relationships  . Social connections:    Talks on phone: Never    Gets together: Never    Attends religious service: More than 4 times per year    Active member of club or organization: No    Attends meetings of clubs or organizations: Never    Relationship status: Divorced  . Intimate partner violence:    Fear of current or  ex partner: No    Emotionally abused: No    Physically abused: No    Forced sexual activity: No  Other Topics Concern  . Not on file  Social History Narrative  . Not on file    Family History  Problem Relation Age of Onset  . Hypertension Mother   . Breast cancer Mother 40  . Breast cancer Sister 38     Current Outpatient Medications:  .  ANORO ELLIPTA 62.5-25 MCG/INH AEPB, INHALE 1 PUFF BY MOUTH DAILY, Disp: 60 each, Rfl: 0 .  BREO ELLIPTA 200-25 MCG/INH AEPB, Inhale 1 puff into the lungs daily. , Disp: , Rfl:  .  cloNIDine (CATAPRES) 0.3 MG tablet, Take 2 tablets (0.6 mg total) by mouth 2 (two) times daily. (Patient taking differently: Take 0.3 mg by mouth 2 (two) times daily. ), Disp: 120 tablet, Rfl: 2 .  cyclobenzaprine (FLEXERIL) 5 MG tablet, Take 5 mg by mouth at bedtime., Disp: , Rfl:  .  hydrALAZINE (APRESOLINE) 100 MG tablet, Take 1 tablet (100 mg total) by mouth 3 (three) times daily. (Patient taking differently: Take 100 mg by mouth daily. ), Disp: 90 tablet, Rfl: 1 .  hydrOXYzine (ATARAX/VISTARIL) 10 MG tablet, TAKE 1 TABLET BY MOUTH THREE TIMES A DAY AS NEEDED (Patient taking differently: Take 10 mg by mouth 3 (three) times daily as needed for itching. ), Disp: 90 tablet, Rfl: 0 .  loperamide (IMODIUM) 2 MG capsule, Take 1 capsule (2 mg total) by mouth 3 (three) times daily., Disp: 60 capsule, Rfl: 0 .  lovastatin (MEVACOR) 40 MG tablet, Take 1 tablet (40 mg total) by mouth at bedtime., Disp: 90 tablet, Rfl: 1 .  megestrol (MEGACE) 400 MG/10ML suspension, Take 10 mLs (400 mg total) by mouth daily for 30 doses., Disp: 300 mL, Rfl: 0 .  methocarbamol (ROBAXIN) 500 MG tablet, Take 1 tablet (500 mg total) by mouth at bedtime as needed for muscle spasms., Disp: 30 tablet, Rfl: 0 .  metoprolol succinate (TOPROL-XL) 100 MG 24 hr tablet, TAKE 1 TABLET BY MOUTH DAILY, Disp: 30 tablet, Rfl: 0 .  mirtazapine (REMERON) 15 MG tablet, take 1 tablet by mouth at bedtime for APPETITIE  (Patient taking differently: Take 15 mg by mouth at bedtime. take 1 tablet by mouth at bedtime for APPETITIE), Disp: 180 tablet, Rfl: 0 .  montelukast (SINGULAIR) 10 MG tablet, Take 1 tablet (10 mg total) by mouth at bedtime as needed., Disp: 90 tablet, Rfl: 1 .  omeprazole (PRILOSEC) 40 MG capsule, Take 1 capsule (40 mg total) by mouth 2 (two) times daily., Disp: 60 capsule, Rfl: 2 .  oxybutynin (DITROPAN) 5 MG tablet, TAKE 1 TABLET BY MOUTH TWICE DAILY (Patient taking differently: Take 5 mg by mouth 2 (two) times daily. ), Disp: 180 tablet, Rfl: 0 .  Potassium 99 MG TABS, Take 1  tablet by mouth daily as needed (cramps). , Disp: , Rfl:  .  sertraline (ZOLOFT) 100 MG tablet, Take 2 tablets (200 mg total) by mouth at bedtime., Disp: 60 tablet, Rfl: 3 .  terazosin (HYTRIN) 5 MG capsule, TAKE 1 CAPSULE BY MOUTH ONCE DAILY AT BEDTIME, Disp: 30 capsule, Rfl: 0 .  traMADol (ULTRAM) 50 MG tablet, Take 1 tablet (50 mg total) by mouth every 6 (six) hours., Disp: 30 tablet, Rfl: 1 .  traZODone (DESYREL) 50 MG tablet, Take 0.5-1 tablets (25-50 mg total) by mouth at bedtime as needed for sleep. (Patient taking differently: Take 50 mg by mouth at bedtime. ), Disp: 30 tablet, Rfl: 3 .  triamcinolone cream (KENALOG) 0.1 %, APPLY TOPICALLY TWICE DAILY AS NEEDED FOR ITCHING (Patient taking differently: Apply 1 application topically 2 (two) times daily as needed (itching). ), Disp: 30 g, Rfl: 0 .  albuterol (PROVENTIL HFA;VENTOLIN HFA) 108 (90 Base) MCG/ACT inhaler, Inhale 2 puffs into the lungs every 6 (six) hours as needed for wheezing or shortness of breath. (Patient not taking: Reported on 01/26/2019), Disp: 1 Inhaler, Rfl: 3 .  clonazePAM (KLONOPIN) 0.5 MG tablet, Take 0.5 mg by mouth 3 (three) times daily. , Disp: , Rfl:  .  meclizine (ANTIVERT) 12.5 MG tablet, Take 12.5 mg by mouth 3 (three) times daily as needed for dizziness. , Disp: , Rfl: 0 .  oxyCODONE (OXY IR/ROXICODONE) 5 MG immediate release tablet, Take  1-2 tablets (5-10 mg total) by mouth every 4 (four) hours as needed for moderate pain. (Patient not taking: Reported on 01/26/2019), Disp: 30 tablet, Rfl: 0  Ct Chest W Contrast  Result Date: 01/22/2019 CLINICAL DATA:  Stage III colon cancer.  Chest staging. EXAM: CT CHEST WITH CONTRAST TECHNIQUE: Multidetector CT imaging of the chest was performed during intravenous contrast administration. CONTRAST:  9mL OMNIPAQUE IOHEXOL 300 MG/ML  SOLN COMPARISON:  11/28/2026 chest radiograph. FINDINGS: Cardiovascular: Top-normal heart size. No significant pericardial effusion/thickening. Left anterior descending coronary atherosclerosis. Atherosclerotic nonaneurysmal thoracic aorta. Dilated main pulmonary artery (3.5 cm diameter). No central pulmonary emboli. Mediastinum/Nodes: Subcentimeter hypodense right thyroid lobe nodule. Unremarkable esophagus. No pathologically enlarged axillary, mediastinal or hilar lymph nodes. Lungs/Pleura: No pneumothorax. No pleural effusion. Mild centrilobular emphysema. Irregular solid 1.9 cm peripheral left upper lobe pulmonary nodule (series 3/image 71). Lobulated solid 1.5 cm medial right upper lobe pulmonary nodule (series 3/image 47). Anterior left upper lobe 0.2 cm solid pulmonary nodule (series 3/image 46). Otherwise no acute consolidative airspace disease or additional significant pulmonary nodules. Upper abdomen: No acute abnormality. Small 1.0 cm focus of hyperenhancement at the liver dome (series 2/image 134) is unchanged since 03/13/2009 CT abdomen study and considered benign. Musculoskeletal: No aggressive appearing focal osseous lesions. Soft tissue anchors are seen in the right humeral head. Moderate thoracic spondylosis. IMPRESSION: 1. Three scattered solid pulmonary nodules in the upper lobes bilaterally, largest 1.9 cm in the left upper lobe. Metastatic disease not excluded. PET-CT may be considered for further characterization/biopsy planning as clinically warranted. 2. No  thoracic adenopathy. 3. Dilated main pulmonary artery, suggesting pulmonary arterial hypertension. 4. One vessel coronary atherosclerosis. Aortic Atherosclerosis (ICD10-I70.0) and Emphysema (ICD10-J43.9). Electronically Signed   By: Ilona Sorrel M.D.   On: 01/22/2019 08:35    No images are attached to the encounter.   CMP Latest Ref Rng & Units 12/19/2018  Glucose 70 - 99 mg/dL 126(H)  BUN 8 - 23 mg/dL 9  Creatinine 0.44 - 1.00 mg/dL 0.62  Sodium 135 - 145 mmol/L  138  Potassium 3.5 - 5.1 mmol/L 3.9  Chloride 98 - 111 mmol/L 105  CO2 22 - 32 mmol/L 25  Calcium 8.9 - 10.3 mg/dL 9.5  Total Protein 6.5 - 8.1 g/dL 7.7  Total Bilirubin 0.3 - 1.2 mg/dL 0.3  Alkaline Phos 38 - 126 U/L 109  AST 15 - 41 U/L 21  ALT 0 - 44 U/L 17   CBC Latest Ref Rng & Units 12/19/2018  WBC 4.0 - 10.5 K/uL 7.2  Hemoglobin 12.0 - 15.0 g/dL 10.5(L)  Hematocrit 36.0 - 46.0 % 34.3(L)  Platelets 150 - 400 K/uL 423(H)    Assessment and plan: Patient is a 73 year old female with invasive adenocarcinoma of the colon stage III BpT3 pN1a cMx status post hemicolectomy  During my last visit with her in March 2020 we discussed the risks and benefits of adjuvant chemotherapy given her stage III disease.  However given her performance status at that time as well as the fact that she was living alone and had no social support it was mutually decided to hold off on adjuvant chemotherapy at that time.  As a part of completion staging work-up we did order a CT chest  I have reviewed CT chest images independently and discussed findings with the patient.  Patient was found to have 3 solid pulmonary nodules bilaterally the largest one being 1.9 cm.  This is concerning for metastatic disease.  I have recommended that we should proceed with a PET CT scan at this time and depending on PET CT scan findings she would need either a CT-guided or bronchoscopy guided biopsy at this time.  Follow-up instructions: I will review her PET/CT and  set her up for biopsy and follow up with her after that  I discussed the assessment and treatment plan with the patient. The patient was provided an opportunity to ask questions and all were answered. The patient agreed with the plan and demonstrated an understanding of the instructions.   The patient was advised to call back or seek an in-person evaluation if the symptoms worsen or if the condition fails to improve as anticipated.  I provided 20 minutes of non face-to-face telephone visit time during this encounter, and > 50% was spent counseling as documented under my assessment & plan.  Visit Diagnosis: 1. Lung nodules   2. Malignant neoplasm of colon, unspecified part of colon ([place])     Dr. Randa Evens, MD, MPH [facility] [phone] 01/27/2019 8:20 AM

## 2019-01-28 DIAGNOSIS — S82841D Displaced bimalleolar fracture of right lower leg, subsequent encounter for closed fracture with routine healing: Secondary | ICD-10-CM | POA: Diagnosis not present

## 2019-01-29 ENCOUNTER — Ambulatory Visit: Admission: RE | Admit: 2019-01-29 | Payer: Medicare Other | Source: Ambulatory Visit

## 2019-01-29 ENCOUNTER — Telehealth: Payer: Self-pay | Admitting: Gastroenterology

## 2019-01-29 DIAGNOSIS — M80071D Age-related osteoporosis with current pathological fracture, right ankle and foot, subsequent encounter for fracture with routine healing: Secondary | ICD-10-CM | POA: Diagnosis not present

## 2019-01-29 DIAGNOSIS — M80062D Age-related osteoporosis with current pathological fracture, left lower leg, subsequent encounter for fracture with routine healing: Secondary | ICD-10-CM | POA: Diagnosis not present

## 2019-01-29 DIAGNOSIS — J40 Bronchitis, not specified as acute or chronic: Secondary | ICD-10-CM | POA: Diagnosis not present

## 2019-01-29 DIAGNOSIS — M4802 Spinal stenosis, cervical region: Secondary | ICD-10-CM | POA: Diagnosis not present

## 2019-01-29 DIAGNOSIS — M4692 Unspecified inflammatory spondylopathy, cervical region: Secondary | ICD-10-CM | POA: Diagnosis not present

## 2019-01-29 DIAGNOSIS — I10 Essential (primary) hypertension: Secondary | ICD-10-CM | POA: Diagnosis not present

## 2019-01-29 DIAGNOSIS — M450 Ankylosing spondylitis of multiple sites in spine: Secondary | ICD-10-CM | POA: Diagnosis not present

## 2019-01-29 DIAGNOSIS — Z604 Social exclusion and rejection: Secondary | ICD-10-CM | POA: Diagnosis not present

## 2019-01-29 DIAGNOSIS — C189 Malignant neoplasm of colon, unspecified: Secondary | ICD-10-CM | POA: Diagnosis not present

## 2019-01-29 DIAGNOSIS — K219 Gastro-esophageal reflux disease without esophagitis: Secondary | ICD-10-CM | POA: Diagnosis not present

## 2019-01-29 DIAGNOSIS — R42 Dizziness and giddiness: Secondary | ICD-10-CM | POA: Diagnosis not present

## 2019-01-29 DIAGNOSIS — M7500 Adhesive capsulitis of unspecified shoulder: Secondary | ICD-10-CM | POA: Diagnosis not present

## 2019-01-29 DIAGNOSIS — Z433 Encounter for attention to colostomy: Secondary | ICD-10-CM | POA: Diagnosis not present

## 2019-01-29 DIAGNOSIS — M48061 Spinal stenosis, lumbar region without neurogenic claudication: Secondary | ICD-10-CM | POA: Diagnosis not present

## 2019-01-29 DIAGNOSIS — G894 Chronic pain syndrome: Secondary | ICD-10-CM | POA: Diagnosis not present

## 2019-01-29 DIAGNOSIS — Z87440 Personal history of urinary (tract) infections: Secondary | ICD-10-CM | POA: Diagnosis not present

## 2019-01-29 DIAGNOSIS — Z9181 History of falling: Secondary | ICD-10-CM | POA: Diagnosis not present

## 2019-01-29 DIAGNOSIS — Z7901 Long term (current) use of anticoagulants: Secondary | ICD-10-CM | POA: Diagnosis not present

## 2019-01-29 DIAGNOSIS — Z85038 Personal history of other malignant neoplasm of large intestine: Secondary | ICD-10-CM | POA: Diagnosis not present

## 2019-01-29 NOTE — Telephone Encounter (Signed)
I spoke with Dr Bonna Gains and she stated I should call her PCP to inform them of her situation. I have left a message with the lady @ the from desk for the Triage Nurse to contact patient.

## 2019-01-29 NOTE — Telephone Encounter (Signed)
Patient called & l/m on v/m 01-28-2019 in about some type of bloodwork she was to have drawn 01-29-2019. I have called & l/m for her to return call.

## 2019-01-29 NOTE — Telephone Encounter (Signed)
Yes Dr. Marius Ditch that is correct I have already forwarded message to both Debbie and Dr. Bonna Gains

## 2019-01-29 NOTE — Telephone Encounter (Signed)
Patient called in very horse in throat & was hard to understand. I understood her to say she was very dizzy & was in the floor. I offer to call 911 & family several times she refused me to do so."stating she would be fine." She made her way to a chair then to her bed to lay down  & this would go away. She was letting us know she would not be able to get lab work done today from discussion with Dr Verlin Grills nurse.

## 2019-01-29 NOTE — Telephone Encounter (Signed)
Pt somewhat difficult to understand and stated that she could not drive to come to office but I informed pt that we are closed and doing phone calls only at present. I did inform pt that panic attacks and anxiety is not something that Dr. Bonna Gains treats was there something else bothering her and she could not specify. She stated she is my cancer doctor and I advised pt that she was a GI doctor. She saw Dr. Janese Banks on 4/27 to discuss invasive adenocarcinoma of the colon Stage III and pulmonary nodules bil. That is concerning for metastatic disease. Recommened at PET CT scan. I have sent Dr. Janese Banks and Honor Loh, NP message regarding phone call.

## 2019-01-29 NOTE — Telephone Encounter (Signed)
Spoke with pt she states she was having a panic attack earlier and she is unable to walk to the car she is  Feeling very weak, I offered to call 911 or EMS for her but she refused, pt is home alone and does not want me to call family member, pt call pt or advise

## 2019-02-02 DIAGNOSIS — M80071D Age-related osteoporosis with current pathological fracture, right ankle and foot, subsequent encounter for fracture with routine healing: Secondary | ICD-10-CM | POA: Diagnosis not present

## 2019-02-02 DIAGNOSIS — M7500 Adhesive capsulitis of unspecified shoulder: Secondary | ICD-10-CM | POA: Diagnosis not present

## 2019-02-02 DIAGNOSIS — M450 Ankylosing spondylitis of multiple sites in spine: Secondary | ICD-10-CM | POA: Diagnosis not present

## 2019-02-02 DIAGNOSIS — K219 Gastro-esophageal reflux disease without esophagitis: Secondary | ICD-10-CM | POA: Diagnosis not present

## 2019-02-02 DIAGNOSIS — Z87440 Personal history of urinary (tract) infections: Secondary | ICD-10-CM | POA: Diagnosis not present

## 2019-02-02 DIAGNOSIS — M4692 Unspecified inflammatory spondylopathy, cervical region: Secondary | ICD-10-CM | POA: Diagnosis not present

## 2019-02-02 DIAGNOSIS — Z433 Encounter for attention to colostomy: Secondary | ICD-10-CM | POA: Diagnosis not present

## 2019-02-02 DIAGNOSIS — I10 Essential (primary) hypertension: Secondary | ICD-10-CM | POA: Diagnosis not present

## 2019-02-02 DIAGNOSIS — M80062D Age-related osteoporosis with current pathological fracture, left lower leg, subsequent encounter for fracture with routine healing: Secondary | ICD-10-CM | POA: Diagnosis not present

## 2019-02-02 DIAGNOSIS — Z7901 Long term (current) use of anticoagulants: Secondary | ICD-10-CM | POA: Diagnosis not present

## 2019-02-02 DIAGNOSIS — Z85038 Personal history of other malignant neoplasm of large intestine: Secondary | ICD-10-CM | POA: Diagnosis not present

## 2019-02-02 DIAGNOSIS — G894 Chronic pain syndrome: Secondary | ICD-10-CM | POA: Diagnosis not present

## 2019-02-02 DIAGNOSIS — M48061 Spinal stenosis, lumbar region without neurogenic claudication: Secondary | ICD-10-CM | POA: Diagnosis not present

## 2019-02-02 DIAGNOSIS — Z9181 History of falling: Secondary | ICD-10-CM | POA: Diagnosis not present

## 2019-02-02 DIAGNOSIS — M4802 Spinal stenosis, cervical region: Secondary | ICD-10-CM | POA: Diagnosis not present

## 2019-02-02 DIAGNOSIS — J40 Bronchitis, not specified as acute or chronic: Secondary | ICD-10-CM | POA: Diagnosis not present

## 2019-02-03 DIAGNOSIS — Z433 Encounter for attention to colostomy: Secondary | ICD-10-CM | POA: Diagnosis not present

## 2019-02-03 DIAGNOSIS — M450 Ankylosing spondylitis of multiple sites in spine: Secondary | ICD-10-CM | POA: Diagnosis not present

## 2019-02-03 DIAGNOSIS — M4692 Unspecified inflammatory spondylopathy, cervical region: Secondary | ICD-10-CM | POA: Diagnosis not present

## 2019-02-03 DIAGNOSIS — M7500 Adhesive capsulitis of unspecified shoulder: Secondary | ICD-10-CM | POA: Diagnosis not present

## 2019-02-03 DIAGNOSIS — Z9181 History of falling: Secondary | ICD-10-CM | POA: Diagnosis not present

## 2019-02-03 DIAGNOSIS — K219 Gastro-esophageal reflux disease without esophagitis: Secondary | ICD-10-CM | POA: Diagnosis not present

## 2019-02-03 DIAGNOSIS — G894 Chronic pain syndrome: Secondary | ICD-10-CM | POA: Diagnosis not present

## 2019-02-03 DIAGNOSIS — Z7901 Long term (current) use of anticoagulants: Secondary | ICD-10-CM | POA: Diagnosis not present

## 2019-02-03 DIAGNOSIS — J40 Bronchitis, not specified as acute or chronic: Secondary | ICD-10-CM | POA: Diagnosis not present

## 2019-02-03 DIAGNOSIS — Z85038 Personal history of other malignant neoplasm of large intestine: Secondary | ICD-10-CM | POA: Diagnosis not present

## 2019-02-03 DIAGNOSIS — M48061 Spinal stenosis, lumbar region without neurogenic claudication: Secondary | ICD-10-CM | POA: Diagnosis not present

## 2019-02-03 DIAGNOSIS — I10 Essential (primary) hypertension: Secondary | ICD-10-CM | POA: Diagnosis not present

## 2019-02-03 DIAGNOSIS — M80062D Age-related osteoporosis with current pathological fracture, left lower leg, subsequent encounter for fracture with routine healing: Secondary | ICD-10-CM | POA: Diagnosis not present

## 2019-02-03 DIAGNOSIS — Z87440 Personal history of urinary (tract) infections: Secondary | ICD-10-CM | POA: Diagnosis not present

## 2019-02-03 DIAGNOSIS — M4802 Spinal stenosis, cervical region: Secondary | ICD-10-CM | POA: Diagnosis not present

## 2019-02-03 DIAGNOSIS — M80071D Age-related osteoporosis with current pathological fracture, right ankle and foot, subsequent encounter for fracture with routine healing: Secondary | ICD-10-CM | POA: Diagnosis not present

## 2019-02-04 ENCOUNTER — Telehealth: Payer: Self-pay | Admitting: *Deleted

## 2019-02-04 ENCOUNTER — Ambulatory Visit: Payer: Medicare Other | Admitting: Gastroenterology

## 2019-02-04 DIAGNOSIS — Z9181 History of falling: Secondary | ICD-10-CM | POA: Diagnosis not present

## 2019-02-04 DIAGNOSIS — M4802 Spinal stenosis, cervical region: Secondary | ICD-10-CM | POA: Diagnosis not present

## 2019-02-04 DIAGNOSIS — K219 Gastro-esophageal reflux disease without esophagitis: Secondary | ICD-10-CM | POA: Diagnosis not present

## 2019-02-04 DIAGNOSIS — M450 Ankylosing spondylitis of multiple sites in spine: Secondary | ICD-10-CM | POA: Diagnosis not present

## 2019-02-04 DIAGNOSIS — M80062D Age-related osteoporosis with current pathological fracture, left lower leg, subsequent encounter for fracture with routine healing: Secondary | ICD-10-CM | POA: Diagnosis not present

## 2019-02-04 DIAGNOSIS — G894 Chronic pain syndrome: Secondary | ICD-10-CM | POA: Diagnosis not present

## 2019-02-04 DIAGNOSIS — Z7901 Long term (current) use of anticoagulants: Secondary | ICD-10-CM | POA: Diagnosis not present

## 2019-02-04 DIAGNOSIS — Z85038 Personal history of other malignant neoplasm of large intestine: Secondary | ICD-10-CM | POA: Diagnosis not present

## 2019-02-04 DIAGNOSIS — J40 Bronchitis, not specified as acute or chronic: Secondary | ICD-10-CM | POA: Diagnosis not present

## 2019-02-04 DIAGNOSIS — Z87440 Personal history of urinary (tract) infections: Secondary | ICD-10-CM | POA: Diagnosis not present

## 2019-02-04 DIAGNOSIS — M48061 Spinal stenosis, lumbar region without neurogenic claudication: Secondary | ICD-10-CM | POA: Diagnosis not present

## 2019-02-04 DIAGNOSIS — M4692 Unspecified inflammatory spondylopathy, cervical region: Secondary | ICD-10-CM | POA: Diagnosis not present

## 2019-02-04 DIAGNOSIS — M7500 Adhesive capsulitis of unspecified shoulder: Secondary | ICD-10-CM | POA: Diagnosis not present

## 2019-02-04 DIAGNOSIS — M80071D Age-related osteoporosis with current pathological fracture, right ankle and foot, subsequent encounter for fracture with routine healing: Secondary | ICD-10-CM | POA: Diagnosis not present

## 2019-02-04 DIAGNOSIS — Z433 Encounter for attention to colostomy: Secondary | ICD-10-CM | POA: Diagnosis not present

## 2019-02-04 DIAGNOSIS — I10 Essential (primary) hypertension: Secondary | ICD-10-CM | POA: Diagnosis not present

## 2019-02-04 NOTE — Telephone Encounter (Signed)
Called patient and reminded her of the pet scan tom at 1:30.  She has to arrive at [facility] at 1 pm.  She has to be NPO for 6 hours . I told her she can eat something light by 7 am in the morning . I suggested 2 eggs and 1 piece of toast and no orange juice. I explained they will check her sugar level and it has to be under 200 to do test. That is why I am giving her specific info. To make sure that she does right thing and is able to have scan tom. She is agreeable and will try to get there and do all things correctly

## 2019-02-05 ENCOUNTER — Other Ambulatory Visit: Payer: Self-pay

## 2019-02-05 ENCOUNTER — Ambulatory Visit
Admission: RE | Admit: 2019-02-05 | Discharge: 2019-02-05 | Disposition: A | Discharge status: Home / Self Care | Payer: Medicare Other | Source: Ambulatory Visit | Attending: Oncology | Admitting: Oncology

## 2019-02-05 DIAGNOSIS — C187 Malignant neoplasm of sigmoid colon: Secondary | ICD-10-CM

## 2019-02-05 DIAGNOSIS — I700 Atherosclerosis of aorta: Secondary | ICD-10-CM | POA: Diagnosis not present

## 2019-02-05 DIAGNOSIS — R918 Other nonspecific abnormal finding of lung field: Secondary | ICD-10-CM | POA: Diagnosis not present

## 2019-02-05 LAB — GLUCOSE, CAPILLARY: Glucose-Capillary: 114 mg/dL — ABNORMAL HIGH (ref 70–99)

## 2019-02-05 MED ORDER — FLUDEOXYGLUCOSE F - 18 (FDG) INJECTION
7.4000 | Freq: Once | INTRAVENOUS | Status: AC | PRN
  Administered 2019-02-05: 7.45 via INTRAVENOUS

## 2019-02-06 ENCOUNTER — Other Ambulatory Visit: Payer: Self-pay | Admitting: *Deleted

## 2019-02-06 ENCOUNTER — Other Ambulatory Visit: Payer: Self-pay | Admitting: Oncology

## 2019-02-06 DIAGNOSIS — G894 Chronic pain syndrome: Secondary | ICD-10-CM | POA: Diagnosis not present

## 2019-02-06 DIAGNOSIS — M7500 Adhesive capsulitis of unspecified shoulder: Secondary | ICD-10-CM | POA: Diagnosis not present

## 2019-02-06 DIAGNOSIS — I10 Essential (primary) hypertension: Secondary | ICD-10-CM | POA: Diagnosis not present

## 2019-02-06 DIAGNOSIS — R942 Abnormal results of pulmonary function studies: Secondary | ICD-10-CM

## 2019-02-06 DIAGNOSIS — R918 Other nonspecific abnormal finding of lung field: Secondary | ICD-10-CM

## 2019-02-06 DIAGNOSIS — M48061 Spinal stenosis, lumbar region without neurogenic claudication: Secondary | ICD-10-CM | POA: Diagnosis not present

## 2019-02-06 DIAGNOSIS — Z433 Encounter for attention to colostomy: Secondary | ICD-10-CM | POA: Diagnosis not present

## 2019-02-06 DIAGNOSIS — J40 Bronchitis, not specified as acute or chronic: Secondary | ICD-10-CM | POA: Diagnosis not present

## 2019-02-06 DIAGNOSIS — Z85038 Personal history of other malignant neoplasm of large intestine: Secondary | ICD-10-CM | POA: Diagnosis not present

## 2019-02-06 DIAGNOSIS — M4692 Unspecified inflammatory spondylopathy, cervical region: Secondary | ICD-10-CM | POA: Diagnosis not present

## 2019-02-06 DIAGNOSIS — K219 Gastro-esophageal reflux disease without esophagitis: Secondary | ICD-10-CM | POA: Diagnosis not present

## 2019-02-06 DIAGNOSIS — M80071D Age-related osteoporosis with current pathological fracture, right ankle and foot, subsequent encounter for fracture with routine healing: Secondary | ICD-10-CM | POA: Diagnosis not present

## 2019-02-06 DIAGNOSIS — M80062D Age-related osteoporosis with current pathological fracture, left lower leg, subsequent encounter for fracture with routine healing: Secondary | ICD-10-CM | POA: Diagnosis not present

## 2019-02-06 DIAGNOSIS — M4802 Spinal stenosis, cervical region: Secondary | ICD-10-CM | POA: Diagnosis not present

## 2019-02-06 DIAGNOSIS — Z7901 Long term (current) use of anticoagulants: Secondary | ICD-10-CM | POA: Diagnosis not present

## 2019-02-06 DIAGNOSIS — C189 Malignant neoplasm of colon, unspecified: Secondary | ICD-10-CM

## 2019-02-06 DIAGNOSIS — Z87440 Personal history of urinary (tract) infections: Secondary | ICD-10-CM | POA: Diagnosis not present

## 2019-02-06 DIAGNOSIS — M450 Ankylosing spondylitis of multiple sites in spine: Secondary | ICD-10-CM | POA: Diagnosis not present

## 2019-02-06 DIAGNOSIS — Z9181 History of falling: Secondary | ICD-10-CM | POA: Diagnosis not present

## 2019-02-06 NOTE — Progress Notes (Signed)
Pre proc covid

## 2019-02-09 ENCOUNTER — Other Ambulatory Visit
Admission: RE | Admit: 2019-02-09 | Discharge: 2019-02-09 | Disposition: A | Discharge status: Home / Self Care | Payer: Medicare Other | Source: Ambulatory Visit | Attending: Oncology | Admitting: Oncology

## 2019-02-09 ENCOUNTER — Other Ambulatory Visit: Payer: Self-pay

## 2019-02-09 DIAGNOSIS — M7500 Adhesive capsulitis of unspecified shoulder: Secondary | ICD-10-CM | POA: Diagnosis not present

## 2019-02-09 DIAGNOSIS — J40 Bronchitis, not specified as acute or chronic: Secondary | ICD-10-CM | POA: Diagnosis not present

## 2019-02-09 DIAGNOSIS — Z1159 Encounter for screening for other viral diseases: Secondary | ICD-10-CM | POA: Insufficient documentation

## 2019-02-09 DIAGNOSIS — M80062D Age-related osteoporosis with current pathological fracture, left lower leg, subsequent encounter for fracture with routine healing: Secondary | ICD-10-CM | POA: Diagnosis not present

## 2019-02-09 DIAGNOSIS — M48061 Spinal stenosis, lumbar region without neurogenic claudication: Secondary | ICD-10-CM | POA: Diagnosis not present

## 2019-02-09 DIAGNOSIS — M450 Ankylosing spondylitis of multiple sites in spine: Secondary | ICD-10-CM | POA: Diagnosis not present

## 2019-02-09 DIAGNOSIS — M80071D Age-related osteoporosis with current pathological fracture, right ankle and foot, subsequent encounter for fracture with routine healing: Secondary | ICD-10-CM | POA: Diagnosis not present

## 2019-02-09 DIAGNOSIS — I10 Essential (primary) hypertension: Secondary | ICD-10-CM | POA: Diagnosis not present

## 2019-02-09 DIAGNOSIS — Z87440 Personal history of urinary (tract) infections: Secondary | ICD-10-CM | POA: Diagnosis not present

## 2019-02-09 DIAGNOSIS — K219 Gastro-esophageal reflux disease without esophagitis: Secondary | ICD-10-CM | POA: Diagnosis not present

## 2019-02-09 DIAGNOSIS — Z433 Encounter for attention to colostomy: Secondary | ICD-10-CM | POA: Diagnosis not present

## 2019-02-09 DIAGNOSIS — M4802 Spinal stenosis, cervical region: Secondary | ICD-10-CM | POA: Diagnosis not present

## 2019-02-09 DIAGNOSIS — M4692 Unspecified inflammatory spondylopathy, cervical region: Secondary | ICD-10-CM | POA: Diagnosis not present

## 2019-02-09 DIAGNOSIS — Z9181 History of falling: Secondary | ICD-10-CM | POA: Diagnosis not present

## 2019-02-09 DIAGNOSIS — Z7901 Long term (current) use of anticoagulants: Secondary | ICD-10-CM | POA: Diagnosis not present

## 2019-02-09 DIAGNOSIS — Z85038 Personal history of other malignant neoplasm of large intestine: Secondary | ICD-10-CM | POA: Diagnosis not present

## 2019-02-09 DIAGNOSIS — G894 Chronic pain syndrome: Secondary | ICD-10-CM | POA: Diagnosis not present

## 2019-02-10 LAB — NOVEL CORONAVIRUS, NAA (HOSP ORDER, SEND-OUT TO REF LAB; TAT 18-24 HRS): SARS-CoV-2, NAA: NOT DETECTED

## 2019-02-11 ENCOUNTER — Other Ambulatory Visit: Payer: Self-pay | Admitting: Student

## 2019-02-11 ENCOUNTER — Telehealth: Payer: Self-pay | Admitting: *Deleted

## 2019-02-11 DIAGNOSIS — M48061 Spinal stenosis, lumbar region without neurogenic claudication: Secondary | ICD-10-CM | POA: Diagnosis not present

## 2019-02-11 DIAGNOSIS — Z9181 History of falling: Secondary | ICD-10-CM | POA: Diagnosis not present

## 2019-02-11 DIAGNOSIS — G894 Chronic pain syndrome: Secondary | ICD-10-CM | POA: Diagnosis not present

## 2019-02-11 DIAGNOSIS — M7500 Adhesive capsulitis of unspecified shoulder: Secondary | ICD-10-CM | POA: Diagnosis not present

## 2019-02-11 DIAGNOSIS — M4692 Unspecified inflammatory spondylopathy, cervical region: Secondary | ICD-10-CM | POA: Diagnosis not present

## 2019-02-11 DIAGNOSIS — M80071D Age-related osteoporosis with current pathological fracture, right ankle and foot, subsequent encounter for fracture with routine healing: Secondary | ICD-10-CM | POA: Diagnosis not present

## 2019-02-11 DIAGNOSIS — Z87440 Personal history of urinary (tract) infections: Secondary | ICD-10-CM | POA: Diagnosis not present

## 2019-02-11 DIAGNOSIS — M4802 Spinal stenosis, cervical region: Secondary | ICD-10-CM | POA: Diagnosis not present

## 2019-02-11 DIAGNOSIS — K219 Gastro-esophageal reflux disease without esophagitis: Secondary | ICD-10-CM | POA: Diagnosis not present

## 2019-02-11 DIAGNOSIS — M450 Ankylosing spondylitis of multiple sites in spine: Secondary | ICD-10-CM | POA: Diagnosis not present

## 2019-02-11 DIAGNOSIS — I10 Essential (primary) hypertension: Secondary | ICD-10-CM | POA: Diagnosis not present

## 2019-02-11 DIAGNOSIS — Z85038 Personal history of other malignant neoplasm of large intestine: Secondary | ICD-10-CM | POA: Diagnosis not present

## 2019-02-11 DIAGNOSIS — J40 Bronchitis, not specified as acute or chronic: Secondary | ICD-10-CM | POA: Diagnosis not present

## 2019-02-11 DIAGNOSIS — M80062D Age-related osteoporosis with current pathological fracture, left lower leg, subsequent encounter for fracture with routine healing: Secondary | ICD-10-CM | POA: Diagnosis not present

## 2019-02-11 DIAGNOSIS — Z7901 Long term (current) use of anticoagulants: Secondary | ICD-10-CM | POA: Diagnosis not present

## 2019-02-11 DIAGNOSIS — Z433 Encounter for attention to colostomy: Secondary | ICD-10-CM | POA: Diagnosis not present

## 2019-02-11 NOTE — Telephone Encounter (Signed)
Phone call from answering service with pt on the line. She states she continues to have rectal pressure and small amount of mucous and that it has been an ongoing issue but maybe a little worse over the last month. She states it comes and goes and is "like she needs to have a BM". Reminded that she has an appointment with Dr Dahlia Byes Monday she states she wants to wait to see him and not see another MD. Aware to call back for new changes or concerns.

## 2019-02-12 ENCOUNTER — Ambulatory Visit
Admission: RE | Admit: 2019-02-12 | Discharge: 2019-02-12 | Disposition: A | Discharge status: Home / Self Care | Payer: Medicare Other | Source: Ambulatory Visit | Attending: Oncology | Admitting: Oncology

## 2019-02-12 ENCOUNTER — Ambulatory Visit
Admission: RE | Admit: 2019-02-12 | Discharge: 2019-02-12 | Disposition: A | Discharge status: Home / Self Care | Payer: Medicare Other | Source: Ambulatory Visit | Attending: Interventional Radiology | Admitting: Interventional Radiology

## 2019-02-12 ENCOUNTER — Other Ambulatory Visit: Payer: Self-pay

## 2019-02-12 DIAGNOSIS — Z8249 Family history of ischemic heart disease and other diseases of the circulatory system: Secondary | ICD-10-CM | POA: Diagnosis not present

## 2019-02-12 DIAGNOSIS — Z87891 Personal history of nicotine dependence: Secondary | ICD-10-CM | POA: Insufficient documentation

## 2019-02-12 DIAGNOSIS — M542 Cervicalgia: Secondary | ICD-10-CM | POA: Insufficient documentation

## 2019-02-12 DIAGNOSIS — R911 Solitary pulmonary nodule: Secondary | ICD-10-CM | POA: Diagnosis not present

## 2019-02-12 DIAGNOSIS — Z85038 Personal history of other malignant neoplasm of large intestine: Secondary | ICD-10-CM | POA: Diagnosis not present

## 2019-02-12 DIAGNOSIS — M19011 Primary osteoarthritis, right shoulder: Secondary | ICD-10-CM | POA: Diagnosis not present

## 2019-02-12 DIAGNOSIS — G8929 Other chronic pain: Secondary | ICD-10-CM | POA: Diagnosis not present

## 2019-02-12 DIAGNOSIS — Z79899 Other long term (current) drug therapy: Secondary | ICD-10-CM | POA: Insufficient documentation

## 2019-02-12 DIAGNOSIS — K219 Gastro-esophageal reflux disease without esophagitis: Secondary | ICD-10-CM | POA: Insufficient documentation

## 2019-02-12 DIAGNOSIS — J45909 Unspecified asthma, uncomplicated: Secondary | ICD-10-CM | POA: Insufficient documentation

## 2019-02-12 DIAGNOSIS — M48061 Spinal stenosis, lumbar region without neurogenic claudication: Secondary | ICD-10-CM | POA: Diagnosis not present

## 2019-02-12 DIAGNOSIS — M1990 Unspecified osteoarthritis, unspecified site: Secondary | ICD-10-CM | POA: Diagnosis not present

## 2019-02-12 DIAGNOSIS — F419 Anxiety disorder, unspecified: Secondary | ICD-10-CM | POA: Diagnosis not present

## 2019-02-12 DIAGNOSIS — M25519 Pain in unspecified shoulder: Secondary | ICD-10-CM | POA: Diagnosis not present

## 2019-02-12 DIAGNOSIS — R918 Other nonspecific abnormal finding of lung field: Secondary | ICD-10-CM

## 2019-02-12 DIAGNOSIS — J939 Pneumothorax, unspecified: Secondary | ICD-10-CM | POA: Diagnosis not present

## 2019-02-12 DIAGNOSIS — C189 Malignant neoplasm of colon, unspecified: Secondary | ICD-10-CM | POA: Diagnosis not present

## 2019-02-12 DIAGNOSIS — Z7901 Long term (current) use of anticoagulants: Secondary | ICD-10-CM | POA: Insufficient documentation

## 2019-02-12 DIAGNOSIS — J439 Emphysema, unspecified: Secondary | ICD-10-CM | POA: Diagnosis not present

## 2019-02-12 DIAGNOSIS — M79606 Pain in leg, unspecified: Secondary | ICD-10-CM | POA: Diagnosis not present

## 2019-02-12 DIAGNOSIS — I10 Essential (primary) hypertension: Secondary | ICD-10-CM | POA: Insufficient documentation

## 2019-02-12 DIAGNOSIS — E041 Nontoxic single thyroid nodule: Secondary | ICD-10-CM | POA: Diagnosis not present

## 2019-02-12 DIAGNOSIS — C3411 Malignant neoplasm of upper lobe, right bronchus or lung: Secondary | ICD-10-CM | POA: Insufficient documentation

## 2019-02-12 DIAGNOSIS — R942 Abnormal results of pulmonary function studies: Secondary | ICD-10-CM | POA: Diagnosis not present

## 2019-02-12 DIAGNOSIS — M5136 Other intervertebral disc degeneration, lumbar region: Secondary | ICD-10-CM | POA: Diagnosis not present

## 2019-02-12 DIAGNOSIS — F329 Major depressive disorder, single episode, unspecified: Secondary | ICD-10-CM | POA: Insufficient documentation

## 2019-02-12 LAB — CBC
HCT: 39.2 % (ref 36.0–46.0)
Hemoglobin: 12.4 g/dL (ref 12.0–15.0)
MCH: 26.8 pg (ref 26.0–34.0)
MCHC: 31.6 g/dL (ref 30.0–36.0)
MCV: 84.8 fL (ref 80.0–100.0)
Platelets: 321 10*3/uL (ref 150–400)
RBC: 4.62 MIL/uL (ref 3.87–5.11)
RDW: 15 % (ref 11.5–15.5)
WBC: 12.4 10*3/uL — ABNORMAL HIGH (ref 4.0–10.5)
nRBC: 0 % (ref 0.0–0.2)

## 2019-02-12 LAB — PROTIME-INR
INR: 1 (ref 0.8–1.2)
Prothrombin Time: 13.5 seconds (ref 11.4–15.2)

## 2019-02-12 MED ORDER — MIDAZOLAM HCL 5 MG/5ML IJ SOLN
INTRAMUSCULAR | Status: AC | PRN
  Administered 2019-02-12 (×2): 1 mg via INTRAVENOUS

## 2019-02-12 MED ORDER — FENTANYL CITRATE (PF) 100 MCG/2ML IJ SOLN
INTRAMUSCULAR | Status: AC | PRN
  Administered 2019-02-12: 50 ug via INTRAVENOUS
  Administered 2019-02-12: 25 ug via INTRAVENOUS

## 2019-02-12 MED ORDER — MIDAZOLAM HCL 5 MG/5ML IJ SOLN
INTRAMUSCULAR | Status: AC
  Filled 2019-02-12: qty 5

## 2019-02-12 MED ORDER — FENTANYL CITRATE (PF) 100 MCG/2ML IJ SOLN
INTRAMUSCULAR | Status: AC
  Filled 2019-02-12: qty 4

## 2019-02-12 MED ORDER — SODIUM CHLORIDE 0.9 % IV SOLN
INTRAVENOUS | Status: DC
  Administered 2019-02-12: 10:00:00 via INTRAVENOUS

## 2019-02-12 NOTE — H&P (Signed)
Chief Complaint:   Here for Bx RUL nodule  Referring Physician(s): Rao,Archana C    History of Present Illness: Gloria Allen is a 73 y.o. female with h/o colon ca.  Now with 1.6cm RUL PET+ nodule concerning for met vs primary lung ca No complaints today.  ROS neg.  here for CT lung bx  Past Medical History:  Diagnosis Date  . Anxiety   . Arthritis   . Asthma   . Cancer ([place])   . Cervical central spinal stenosis (C3-C7) (worse at C4-5) 07/30/2017  . Cervical foraminal stenosis (C4-5 and C5-6) (Bilateral) (L>R) 07/30/2017  . Chronic lower extremity pain (Fourth Area of Pain) (Bilateral) (R>L) 06/11/2017  . Chronic neck pain (Primary Area of Pain) (Bilateral) (R>L) 06/11/2017  . Chronic sacroiliac joint pain (Bilateral) (R>L) 06/11/2017  . Chronic shoulder pain [facility]) (Right) 06/11/2017  . Chronic upper extremity pain (Fifth Area of Pain) (Bilateral) (R>L) 06/11/2017  . Colon cancer ([place])   . DDD (degenerative disc disease), cervical 07/30/2017  . DDD (degenerative disc disease), lumbar 07/30/2017  . Depression   . DISH (diffuse idiopathic skeletal hyperostosis) 07/30/2017  . Dyspnea    with exertion  . Entrapment syndrome 06/20/2017   2002 on R and 2010 on L  . Full thickness rotator cuff tear 06/12/2017  . GERD (gastroesophageal reflux disease)   . Grade 1 Anterolisthesis of L4 over L5 07/30/2017  . Headache   . Hx: UTI (urinary tract infection)   . Hypertension   . Inflammation of joint of shoulder region 01/15/2017  . Lumbar central spinal stenosis (L4-5) 07/30/2017  . Lumbar disc protrusion (Left: L5-S1) (Right: L1-2) 07/30/2017   L5-S1 left foraminal protrusion with L5 impingement. L1-2 right paracentral protrusion without impingement.  . Lumbar facet arthropathy (Bilateral) 07/30/2017  . Lumbar facet syndrome (Bilateral) (R>L) 07/30/2017  . Osteoarthritis of shoulder (Right) 01/15/2017    Past Surgical History:  Procedure Laterality Date  .  ABDOMINAL HYSTERECTOMY    . APPENDECTOMY    . BREAST REDUCTION SURGERY Bilateral   . COLONOSCOPY WITH PROPOFOL N/A 11/05/2018   Procedure: COLONOSCOPY WITH PROPOFOL;  Surgeon: Virgel Manifold, MD;  Location: [facility];  Service: Endoscopy;  Laterality: N/A;  . COLONOSCOPY WITH PROPOFOL N/A 11/06/2018   Procedure: COLONOSCOPY WITH PROPOFOL;  Surgeon: Virgel Manifold, MD;  Location: [facility];  Service: Endoscopy;  Laterality: N/A;  . COLOSTOMY    . DILATION AND CURETTAGE OF UTERUS    . ESOPHAGOGASTRODUODENOSCOPY (EGD) WITH PROPOFOL N/A 11/05/2018   Procedure: ESOPHAGOGASTRODUODENOSCOPY (EGD) WITH PROPOFOL;  Surgeon: Virgel Manifold, MD;  Location: [facility];  Service: Endoscopy;  Laterality: N/A;  . ILEOSTOMY Right 11/27/2018   Procedure: ILEOSTOMY;  Surgeon: Jules Husbands, MD;  Location: [facility];  Service: General;  Laterality: Right;  . LAPAROSCOPIC SIGMOID COLECTOMY N/A 11/27/2018   Procedure: LAPAROSCOPIC SIGMOID COLECTOMY;  Surgeon: Jules Husbands, MD;  Location: [facility];  Service: General;  Laterality: N/A;  . ORIF ANKLE FRACTURE Right 11/21/2018   Procedure: OPEN REDUCTION INTERNAL FIXATION (ORIF) ANKLE FRACTURE;  Surgeon: Corky Mull, MD;  Location: [facility];  Service: Orthopedics;  Laterality: Right;  . REDUCTION MAMMAPLASTY    . SHOULDER ARTHROSCOPY WITH OPEN ROTATOR CUFF REPAIR AND DISTAL CLAVICLE ACROMINECTOMY Right 03/26/2017   Procedure: right shoulder arthroscopy, arthroscopic subacromial decompression, distal clavicle excision, mini open rotator cuff repair;  Surgeon: Thornton Park, MD;  Location: [facility];  Service: Orthopedics;  Laterality:  Right;    Allergies: Ace inhibitors; Angiotensin receptor blockers; Sulfa antibiotics; Chlorthalidone; and Flexeril [cyclobenzaprine]  Medications: Prior to Admission medications   Medication Sig Start Date End Date Taking? Authorizing Provider  cloNIDine (CATAPRES) 0.3 MG tablet Take 2 tablets (0.6 mg  total) by mouth 2 (two) times daily. Patient taking differently: Take 0.3 mg by mouth 2 (two) times daily.  05/19/18  Yes Volney American, PA-C  cyclobenzaprine (FLEXERIL) 5 MG tablet Take 5 mg by mouth at bedtime.   Yes [provider]  hydrALAZINE (APRESOLINE) 100 MG tablet Take 1 tablet (100 mg total) by mouth 3 (three) times daily. Patient taking differently: Take 100 mg by mouth daily.  04/22/18  Yes Johnson, Megan P, DO  hydrOXYzine (ATARAX/VISTARIL) 10 MG tablet TAKE 1 TABLET BY MOUTH THREE TIMES A DAY AS NEEDED Patient taking differently: Take 10 mg by mouth 3 (three) times daily as needed for itching.  06/22/18  Yes Johnson, Megan P, DO  lovastatin (MEVACOR) 40 MG tablet Take 1 tablet (40 mg total) by mouth at bedtime. 04/24/18  Yes Johnson, Megan P, DO  metoprolol succinate (TOPROL-XL) 100 MG 24 hr tablet TAKE 1 TABLET BY MOUTH DAILY 06/22/18  Yes Johnson, Megan P, DO  mirtazapine (REMERON) 15 MG tablet take 1 tablet by mouth at bedtime for APPETITIE Patient taking differently: Take 15 mg by mouth at bedtime. take 1 tablet by mouth at bedtime for APPETITIE 04/24/18  Yes Johnson, Megan P, DO  montelukast (SINGULAIR) 10 MG tablet Take 1 tablet (10 mg total) by mouth at bedtime as needed. 04/02/18  Yes Kathrine Haddock, NP  omeprazole (PRILOSEC) 40 MG capsule Take 1 capsule (40 mg total) by mouth 2 (two) times daily. 04/24/18  Yes Johnson, Megan P, DO  oxybutynin (DITROPAN) 5 MG tablet TAKE 1 TABLET BY MOUTH TWICE DAILY Patient taking differently: Take 5 mg by mouth 2 (two) times daily.  08/25/18  Yes Johnson, Megan P, DO  Potassium 99 MG TABS Take 1 tablet by mouth daily as needed (cramps).    Yes [provider]  sertraline (ZOLOFT) 100 MG tablet Take 2 tablets (200 mg total) by mouth at bedtime. 04/24/18  Yes Johnson, Megan P, DO  terazosin (HYTRIN) 5 MG capsule TAKE 1 CAPSULE BY MOUTH ONCE DAILY AT BEDTIME 06/22/18  Yes Johnson, Megan P, DO  traZODone (DESYREL) 50 MG tablet  Take 0.5-1 tablets (25-50 mg total) by mouth at bedtime as needed for sleep. Patient taking differently: Take 50 mg by mouth at bedtime.  04/24/18  Yes Johnson, Megan P, DO  triamcinolone cream (KENALOG) 0.1 % APPLY TOPICALLY TWICE DAILY AS NEEDED FOR ITCHING Patient taking differently: Apply 1 application topically 2 (two) times daily as needed (itching).  06/22/18  Yes Johnson, Megan P, DO  albuterol (PROVENTIL HFA;VENTOLIN HFA) 108 (90 Base) MCG/ACT inhaler Inhale 2 puffs into the lungs every 6 (six) hours as needed for wheezing or shortness of breath. Patient not taking: Reported on 01/26/2019 04/02/18   Kathrine Haddock, NP  Jearl Klinefelter ELLIPTA 62.5-25 MCG/INH AEPB INHALE 1 PUFF BY MOUTH DAILY Patient not taking: Reported on 02/12/2019 08/25/18   Park Liter P, DO  BREO ELLIPTA 200-25 MCG/INH AEPB Inhale 1 puff into the lungs daily.  08/11/18   [provider]  clonazePAM (KLONOPIN) 0.5 MG tablet Take 0.5 mg by mouth 3 (three) times daily.     [provider]  loperamide (IMODIUM) 2 MG capsule Take 1 capsule (2 mg total) by mouth 3 (three) times  daily. Patient not taking: Reported on 02/12/2019 01/05/19   Caroleen Hamman F, MD  meclizine (ANTIVERT) 12.5 MG tablet Take 12.5 mg by mouth 3 (three) times daily as needed for dizziness.  08/20/18   [provider]  methocarbamol (ROBAXIN) 500 MG tablet Take 1 tablet (500 mg total) by mouth at bedtime as needed for muscle spasms. Patient not taking: Reported on 02/12/2019 05/19/18   Volney American, PA-C  oxyCODONE (OXY IR/ROXICODONE) 5 MG immediate release tablet Take 1-2 tablets (5-10 mg total) by mouth every 4 (four) hours as needed for moderate pain. Patient not taking: Reported on 01/26/2019 12/03/18   Tylene Fantasia, PA-C  traMADol (ULTRAM) 50 MG tablet Take 1 tablet (50 mg total) by mouth every 6 (six) hours. Patient not taking: Reported on 02/12/2019 11/22/18   Reche Dixon, PA-C     Family History  Problem Relation Age of  Onset  . Hypertension Mother   . Breast cancer Mother 45  . Breast cancer Sister 81    Social History   Socioeconomic History  . Marital status: Divorced    Spouse name: Not on file  . Number of children: 3  . Years of education: Not on file  . Highest education level: 11th grade  Occupational History  . Not on file  Social Needs  . Financial resource strain: Somewhat hard  . Food insecurity:    Worry: Sometimes true    Inability: Never true  . Transportation needs:    Medical: Yes    Non-medical: No  Tobacco Use  . Smoking status: Former Smoker    Packs/day: 1.00    Types: Cigarettes    Last attempt to quit: 2010    Years since quitting: 10.3  . Smokeless tobacco: Never Used  . Tobacco comment: quit over 30 years ago   Substance and Sexual Activity  . Alcohol use: No  . Drug use: No  . Sexual activity: Never  Lifestyle  . Physical activity:    Days per week: 0 days    Minutes per session: 0 min  . Stress: Not at all  Relationships  . Social connections:    Talks on phone: Never    Gets together: Never    Attends religious service: More than 4 times per year    Active member of club or organization: No    Attends meetings of clubs or organizations: Never    Relationship status: Divorced  Other Topics Concern  . Not on file  Social History Narrative  . Not on file    ECOG Status: 1 - Symptomatic but completely ambulatory  Review of Systems: A 12 point ROS discussed and pertinent positives are indicated in the HPI above.  All other systems are negative.  Review of Systems  Vital Signs: BP 140/62   Pulse (!) 55   Temp 98.6 F (37 C) (Oral)   Resp (!) 28   Ht 5' 5" (1.651 m)   Wt 66.2 kg   SpO2 100%   BMI 24.30 kg/m   Physical Exam Constitutional:      General: She is not in acute distress.    Appearance: She is not toxic-appearing or diaphoretic.  Eyes:     General: No scleral icterus.    Conjunctiva/sclera: Conjunctivae normal.   Cardiovascular:     Rate and Rhythm: Normal rate and regular rhythm.     Heart sounds: No murmur.  Pulmonary:     Effort: Pulmonary effort is normal.  Breath sounds: Normal breath sounds.  Abdominal:     General: Bowel sounds are normal. There is no distension.     Palpations: Abdomen is soft.  Musculoskeletal: Normal range of motion.  Neurological:     General: No focal deficit present.     Mental Status: Mental status is at baseline.  Psychiatric:        Mood and Affect: Mood normal.     Imaging: Ct Chest W Contrast  Result Date: 01/22/2019 CLINICAL DATA:  Stage III colon cancer.  Chest staging. EXAM: CT CHEST WITH CONTRAST TECHNIQUE: Multidetector CT imaging of the chest was performed during intravenous contrast administration. CONTRAST:  80m OMNIPAQUE IOHEXOL 300 MG/ML  SOLN COMPARISON:  11/28/2026 chest radiograph. FINDINGS: Cardiovascular: Top-normal heart size. No significant pericardial effusion/thickening. Left anterior descending coronary atherosclerosis. Atherosclerotic nonaneurysmal thoracic aorta. Dilated main pulmonary artery (3.5 cm diameter). No central pulmonary emboli. Mediastinum/Nodes: Subcentimeter hypodense right thyroid lobe nodule. Unremarkable esophagus. No pathologically enlarged axillary, mediastinal or hilar lymph nodes. Lungs/Pleura: No pneumothorax. No pleural effusion. Mild centrilobular emphysema. Irregular solid 1.9 cm peripheral left upper lobe pulmonary nodule (series 3/image 71). Lobulated solid 1.5 cm medial right upper lobe pulmonary nodule (series 3/image 47). Anterior left upper lobe 0.2 cm solid pulmonary nodule (series 3/image 46). Otherwise no acute consolidative airspace disease or additional significant pulmonary nodules. Upper abdomen: No acute abnormality. Small 1.0 cm focus of hyperenhancement at the liver dome (series 2/image 134) is unchanged since 03/13/2009 CT abdomen study and considered benign. Musculoskeletal: No aggressive appearing  focal osseous lesions. Soft tissue anchors are seen in the right humeral head. Moderate thoracic spondylosis. IMPRESSION: 1. Three scattered solid pulmonary nodules in the upper lobes bilaterally, largest 1.9 cm in the left upper lobe. Metastatic disease not excluded. PET-CT may be considered for further characterization/biopsy planning as clinically warranted. 2. No thoracic adenopathy. 3. Dilated main pulmonary artery, suggesting pulmonary arterial hypertension. 4. One vessel coronary atherosclerosis. Aortic Atherosclerosis (ICD10-I70.0) and Emphysema (ICD10-J43.9). Electronically Signed   By: JIlona SorrelM.D.   On: 01/22/2019 08:35   Nm Pet Image Initial (pi) Skull Base To Thigh  Result Date: 02/05/2019 CLINICAL DATA:  Initial treatment strategy for sigmoid colon cancer status post sigmoid colectomy 11/27/2018, with indeterminate pulmonary nodules on recent chest CT. EXAM: NUCLEAR MEDICINE PET SKULL BASE TO THIGH TECHNIQUE: 7.5 mCi F-18 FDG was injected intravenously. Full-ring PET imaging was performed from the skull base to thigh after the radiotracer. CT data was obtained and used for attenuation correction and anatomic localization. Fasting blood glucose: 114 mg/dl COMPARISON:  01/21/2019 chest CT. FINDINGS: Mediastinal blood pool activity: SUV max 2.3 NECK: No hypermetabolic lymph nodes in the neck. Asymmetric hypermetabolism within one of the muscles of mastication in the left infratemporal fossa, within the left styloglossus muscle and throughout the left tongue, nonspecific, probably activity related and/or inflammatory. No mass correlate on the CT images. Recommend correlation with directed clinical exam. Incidental CT findings: none CHEST: Hypermetabolic lobulated 1.6 cm solid medial right upper lobe pulmonary nodule with max SUV 6.8 (series 3/image 79), slightly increased from 1.5 cm on 01/21/2027 chest CT. No additional hypermetabolic pulmonary findings. Irregular 1.9 cm peripheral left upper lobe  pulmonary nodule is non hypermetabolic with max SUV 1.6 (series 3/image 97), stable since 01/21/2027 chest CT. Solid 0.2 cm anterior left upper lobe pulmonary nodule (series 3/image 81) is below PET resolution and stable. No hypermetabolic axillary, mediastinal or hilar lymph nodes. Incidental CT findings: Left anterior descending coronary atherosclerosis. Atherosclerotic nonaneurysmal thoracic  aorta. Stable dilated main pulmonary artery (3.5 cm diameter). ABDOMEN/PELVIS: No abnormal hypermetabolic activity within the liver, pancreas, adrenal glands, or spleen. No hypermetabolic lymph nodes in the abdomen or pelvis. Status post left hemicolectomy with intact appearing colorectal anastomosis. No abnormal hypermetabolism at the colorectal anastomosis. No unexpected foci of peritoneal hypermetabolism. Incidental CT findings: Simple 1.2 cm inferior right liver cyst. Cholecystectomy. Atherosclerotic nonaneurysmal abdominal aorta. Diverting loop ileostomy in the ventral right abdominal wall. Hysterectomy. SKELETON: No focal hypermetabolic activity to suggest skeletal metastasis. Incidental CT findings: none IMPRESSION: 1. Hypermetabolic 1.6 cm solid medial right upper lobe pulmonary nodule, compatible with pulmonary metastasis. If tissue sampling is clinically desired, recommend targeting this medial right upper lobe nodule. 2. No other sites of hypermetabolic metastatic disease. The 1.9 cm peripheral left upper lobe pulmonary nodule is non-hypermetabolic, more suggestive of a benign etiology. The 0.2 cm anterior left upper lobe pulmonary nodule is below PET resolution. Chest CT follow-up for these nodules recommended in 3-6 months. 3. No metabolic evidence of local tumor recurrence at the colorectal anastomosis. 4. Nonspecific asymmetric muscular hypermetabolism throughout the left tongue, within the left styloglossus muscle and within one of the muscles of mastication in the left infratemporal fossa, without mass  correlate on the CT images, favor benign activity related or inflammatory uptake. 5.  Aortic Atherosclerosis (ICD10-I70.0). Electronically Signed   By: Ilona Sorrel M.D.   On: 02/05/2019 15:45    Labs:  CBC: Recent Labs    11/30/18 0546 12/01/18 0803 12/19/18 1200 02/12/19 0920  WBC 15.9* 16.4* 7.2 12.4*  HGB 8.8* 8.7* 10.5* 12.4  HCT 28.9* 27.7* 34.3* 39.2  PLT 417* 466* 423* 321    COAGS: Recent Labs    11/21/18 1708 02/12/19 0920  INR 1.04 1.0    BMP: Recent Labs    11/24/18 0426  11/28/18 0444 11/29/18 0415 12/02/18 0349 12/19/18 1200  NA 135  --  136 137  --  138  K 4.2  --  4.1 3.8  --  3.9  CL 100  --  103 104  --  105  CO2 26  --  24 26  --  25  GLUCOSE 127*  --  192* 128*  --  126*  BUN 10  --  10 11  --  9  CALCIUM 8.7*  --  8.6* 8.5*  --  9.5  CREATININE 0.57   < > 0.55 0.52 0.58 0.62  GFRNONAA >60   < > >60 >60 >60 >60  GFRAA >60   < > >60 >60 >60 >60   < > = values in this interval not displayed.    LIVER FUNCTION TESTS: Recent Labs    04/22/18 1559 08/21/18 1443 11/12/18 1634 12/19/18 1200  BILITOT <0.2 <0.2 0.4 0.3  AST _0 ALT _1 ALKPHOS 149* 124* 111 109  PROT 7.5 6.6 7.2 7.7  ALBUMIN 4.4 4.2 3.9 3.8    TUMOR MARKERS: No results for input(s): AFPTM, CEA, CA199, CHROMGRNA in the last 8760 hours.  Assessment and Plan:  1.6 cm new RUL PET+ nodule with h/p prev colon ca.  Here for Ct lung bx.  Risks and benefits of CT guided lung nodule biopsy was discussed with the patient including, but not limited to bleeding, hemoptysis, respiratory failure requiring intubation, infection, pneumothorax requiring chest tube placement, stroke from air embolism or even death.  All of the patient's questions were answered and the patient is agreeable to proceed.  Consent signed and in chart.    Thank you for this interesting consult.  I greatly enjoyed meeting Gloria Allen and look forward to participating in their care.  A  copy of this report was sent to the requesting provider on this date.  Electronically Signed: Greggory Keen, MD 02/12/2019, 10:05 AM   I spent a total of  30 Minutes   in face to face in clinical consultation, greater than 50% of which was counseling/coordinating care for this patient with a new lung nodule.

## 2019-02-12 NOTE — Procedures (Signed)
RUL PET + NODULE  S/P CT BX  No comp Stable ebl min Path pending Full report in pacs

## 2019-02-16 ENCOUNTER — Other Ambulatory Visit: Payer: Self-pay

## 2019-02-16 ENCOUNTER — Other Ambulatory Visit: Payer: Self-pay | Admitting: Oncology

## 2019-02-16 ENCOUNTER — Encounter: Payer: Self-pay | Admitting: Surgery

## 2019-02-16 ENCOUNTER — Ambulatory Visit (INDEPENDENT_AMBULATORY_CARE_PROVIDER_SITE_OTHER): Payer: Medicare Other | Admitting: Surgery

## 2019-02-16 ENCOUNTER — Other Ambulatory Visit: Payer: Self-pay | Admitting: Pathology

## 2019-02-16 VITALS — BP 194/88 | HR 78 | Temp 97.5°F | Resp 16 | Ht 65.0 in | Wt 150.4 lb

## 2019-02-16 DIAGNOSIS — Z433 Encounter for attention to colostomy: Secondary | ICD-10-CM | POA: Diagnosis not present

## 2019-02-16 DIAGNOSIS — M450 Ankylosing spondylitis of multiple sites in spine: Secondary | ICD-10-CM | POA: Diagnosis not present

## 2019-02-16 DIAGNOSIS — G894 Chronic pain syndrome: Secondary | ICD-10-CM | POA: Diagnosis not present

## 2019-02-16 DIAGNOSIS — K6289 Other specified diseases of anus and rectum: Secondary | ICD-10-CM | POA: Diagnosis not present

## 2019-02-16 DIAGNOSIS — Z85038 Personal history of other malignant neoplasm of large intestine: Secondary | ICD-10-CM | POA: Diagnosis not present

## 2019-02-16 DIAGNOSIS — M48061 Spinal stenosis, lumbar region without neurogenic claudication: Secondary | ICD-10-CM | POA: Diagnosis not present

## 2019-02-16 DIAGNOSIS — I10 Essential (primary) hypertension: Secondary | ICD-10-CM | POA: Diagnosis not present

## 2019-02-16 DIAGNOSIS — Z7901 Long term (current) use of anticoagulants: Secondary | ICD-10-CM | POA: Diagnosis not present

## 2019-02-16 DIAGNOSIS — J40 Bronchitis, not specified as acute or chronic: Secondary | ICD-10-CM | POA: Diagnosis not present

## 2019-02-16 DIAGNOSIS — Z87440 Personal history of urinary (tract) infections: Secondary | ICD-10-CM | POA: Diagnosis not present

## 2019-02-16 DIAGNOSIS — M4692 Unspecified inflammatory spondylopathy, cervical region: Secondary | ICD-10-CM | POA: Diagnosis not present

## 2019-02-16 DIAGNOSIS — M80071D Age-related osteoporosis with current pathological fracture, right ankle and foot, subsequent encounter for fracture with routine healing: Secondary | ICD-10-CM | POA: Diagnosis not present

## 2019-02-16 DIAGNOSIS — K219 Gastro-esophageal reflux disease without esophagitis: Secondary | ICD-10-CM | POA: Diagnosis not present

## 2019-02-16 DIAGNOSIS — Z9181 History of falling: Secondary | ICD-10-CM | POA: Diagnosis not present

## 2019-02-16 DIAGNOSIS — M4802 Spinal stenosis, cervical region: Secondary | ICD-10-CM | POA: Diagnosis not present

## 2019-02-16 DIAGNOSIS — M80062D Age-related osteoporosis with current pathological fracture, left lower leg, subsequent encounter for fracture with routine healing: Secondary | ICD-10-CM | POA: Diagnosis not present

## 2019-02-16 DIAGNOSIS — M7500 Adhesive capsulitis of unspecified shoulder: Secondary | ICD-10-CM | POA: Diagnosis not present

## 2019-02-16 LAB — SURGICAL PATHOLOGY

## 2019-02-16 MED ORDER — OXYCODONE-ACETAMINOPHEN 5-325 MG PO TABS: 1.0000 | ORAL_TABLET | Freq: Four times a day (QID) | ORAL | 0 refills | Status: DC | PRN

## 2019-02-16 MED ORDER — LOPERAMIDE HCL 2 MG PO CAPS: 2.0000 mg | ORAL_CAPSULE | Freq: Three times a day (TID) | ORAL | 1 refills | Status: DC

## 2019-02-16 MED ORDER — MEGESTROL ACETATE 400 MG/10ML PO SUSP: 400.0000 mg | Freq: Every day | ORAL | 0 refills | Status: DC

## 2019-02-16 NOTE — Progress Notes (Signed)
Outpatient Surgical Follow Up  02/16/2019  SALENE MOHAMUD is an 73 y.o. female.   Chief Complaint  Patient presents with  . Routine Post Op    Sigmoid colectomy    HPI: s/p LAR for CA.  She has refused adjuvant chemotherapy.  She did have right upper lobe mass on PET/CT that prompted a biopsy and results show evidence of small cell CA. adamant that she does not want to do any chemo or radiation therapy.  She now complains of some rectal pain.  No hematochezia.  She does have some occasional mucus BM via the anus.  Experienced some pressure on the lower portion.  No fevers no chills.  Her ostomy is working well.  She is gaining weight she is walking.  There is good output from the loop ileostomy. I am to know about the pathology results and I asked her to get in touch with the oncology office.  I also messaged Dr. Janese Banks and they will get in touch with her  Past Medical History:  Diagnosis Date  . Anxiety   . Arthritis   . Asthma   . Cancer ([place])   . Cervical central spinal stenosis (C3-C7) (worse at C4-5) 07/30/2017  . Cervical foraminal stenosis (C4-5 and C5-6) (Bilateral) (L>R) 07/30/2017  . Chronic lower extremity pain (Fourth Area of Pain) (Bilateral) (R>L) 06/11/2017  . Chronic neck pain (Primary Area of Pain) (Bilateral) (R>L) 06/11/2017  . Chronic sacroiliac joint pain (Bilateral) (R>L) 06/11/2017  . Chronic shoulder pain [facility] Area of Pain) (Right) 06/11/2017  . Chronic upper extremity pain (Fifth Area of Pain) (Bilateral) (R>L) 06/11/2017  . Colon cancer ([place])   . DDD (degenerative disc disease), cervical 07/30/2017  . DDD (degenerative disc disease), lumbar 07/30/2017  . Depression   . DISH (diffuse idiopathic skeletal hyperostosis) 07/30/2017  . Dyspnea    with exertion  . Entrapment syndrome 06/20/2017   2002 on R and 2010 on L  . Full thickness rotator cuff tear 06/12/2017  . GERD (gastroesophageal reflux disease)   . Grade 1 Anterolisthesis of L4 over L5 07/30/2017  .  Headache   . Hx: UTI (urinary tract infection)   . Hypertension   . Inflammation of joint of shoulder region 01/15/2017  . Lumbar central spinal stenosis (L4-5) 07/30/2017  . Lumbar disc protrusion (Left: L5-S1) (Right: L1-2) 07/30/2017   L5-S1 left foraminal protrusion with L5 impingement. L1-2 right paracentral protrusion without impingement.  . Lumbar facet arthropathy (Bilateral) 07/30/2017  . Lumbar facet syndrome (Bilateral) (R>L) 07/30/2017  . Osteoarthritis of shoulder (Right) 01/15/2017    Past Surgical History:  Procedure Laterality Date  . ABDOMINAL HYSTERECTOMY    . APPENDECTOMY    . BREAST REDUCTION SURGERY Bilateral   . COLONOSCOPY WITH PROPOFOL N/A 11/05/2018   Procedure: COLONOSCOPY WITH PROPOFOL;  Surgeon: Virgel Manifold, MD;  Location: [facility] ENDOSCOPY;  Service: Endoscopy;  Laterality: N/A;  . COLONOSCOPY WITH PROPOFOL N/A 11/06/2018   Procedure: COLONOSCOPY WITH PROPOFOL;  Surgeon: Virgel Manifold, MD;  Location: [facility] ENDOSCOPY;  Service: Endoscopy;  Laterality: N/A;  . COLOSTOMY    . DILATION AND CURETTAGE OF UTERUS    . ESOPHAGOGASTRODUODENOSCOPY (EGD) WITH PROPOFOL N/A 11/05/2018   Procedure: ESOPHAGOGASTRODUODENOSCOPY (EGD) WITH PROPOFOL;  Surgeon: Virgel Manifold, MD;  Location: [facility] ENDOSCOPY;  Service: Endoscopy;  Laterality: N/A;  . ILEOSTOMY Right 11/27/2018   Procedure: ILEOSTOMY;  Surgeon: Jules Husbands, MD;  Location: [facility] ORS;  Service: General;  Laterality: Right;  . LAPAROSCOPIC SIGMOID  COLECTOMY N/A 11/27/2018   Procedure: LAPAROSCOPIC SIGMOID COLECTOMY;  Surgeon: Jules Husbands, MD;  Location: [facility] ORS;  Service: General;  Laterality: N/A;  . ORIF ANKLE FRACTURE Right 11/21/2018   Procedure: OPEN REDUCTION INTERNAL FIXATION (ORIF) ANKLE FRACTURE;  Surgeon: Corky Mull, MD;  Location: [facility] ORS;  Service: Orthopedics;  Laterality: Right;  . REDUCTION MAMMAPLASTY    . SHOULDER ARTHROSCOPY WITH OPEN ROTATOR CUFF REPAIR AND DISTAL CLAVICLE  ACROMINECTOMY Right 03/26/2017   Procedure: right shoulder arthroscopy, arthroscopic subacromial decompression, distal clavicle excision, mini open rotator cuff repair;  Surgeon: Thornton Park, MD;  Location: [facility] ORS;  Service: Orthopedics;  Laterality: Right;    Family History  Problem Relation Age of Onset  . Hypertension Mother   . Breast cancer Mother 67  . Breast cancer Sister 28    Social History:  reports that she quit smoking about 10 years ago. Her smoking use included cigarettes. She smoked 1.00 pack per day. She has never used smokeless tobacco. She reports that she does not drink alcohol or use drugs.  Allergies:  Allergies  Allergen Reactions  . Ace Inhibitors Swelling  . Angiotensin Receptor Blockers Swelling  . Sulfa Antibiotics Itching  . Chlorthalidone Rash  . Flexeril [Cyclobenzaprine] Rash    Medications reviewed.    ROS Full ROS performed and is otherwise negative other than what is stated in HPI   BP (!) 194/88   Pulse 78   Temp (!) 97.5 F (36.4 C) (Temporal)   Resp 16   Ht 5\' 5"  (1.651 m)   Wt 150 lb 6.4 oz (68.2 kg)   SpO2 97%   BMI 25.03 kg/m   Physical Exam Vitals signs and nursing note reviewed. Exam conducted with a chaperone present.  Constitutional:      General: She is not in acute distress.    Appearance: Normal appearance. She is normal weight.  Neck:     Musculoskeletal: Normal range of motion and neck supple.  Cardiovascular:     Rate and Rhythm: Normal rate.     Pulses: Normal pulses.     Heart sounds: No friction rub.  Pulmonary:     Effort: Pulmonary effort is normal.     Breath sounds: No stridor.  Abdominal:     General: Abdomen is flat. There is no distension.     Palpations: There is no mass.     Tenderness: There is no abdominal tenderness. There is no guarding or rebound.     Hernia: No hernia is present.     Comments: Ileostomy working , pink patent.  Genitourinary:    Comments: Rectal no blood, no masses  no obvious lesions. Musculoskeletal: Normal range of motion.        General: No swelling or tenderness.  Skin:    General: Skin is warm and dry.     Capillary Refill: Capillary refill takes less than 2 seconds.  Neurological:     General: No focal deficit present.     Mental Status: She is alert and oriented to person, place, and time.  Psychiatric:        Mood and Affect: Mood normal.        Behavior: Behavior normal.        Thought Content: Thought content normal.        Judgment: Judgment normal.    Assessment/Plan: 73 year old female status post anterior resection now with rectal pain.  Will start a work-up to include a barium enema.  She probably will  need flex sig in the near future.  Also discussed with her in detail about next steps.  Encouraged her to follow-up with oncology regarding her small cell lung cancer. We will do only 1 refill of pain medication since she reports daily rectal pain.  Only 20 pills of Percocet Need for surgical intervention.  RTC 2 to 3 weeks.  1. Rectal pain - DG BE (COLON)W SINGLE CM (SOL OR THIN BA); Future    Greater than 50% of the 25 minutes  visit was spent in counseling/coordination of care   Caroleen Hamman, MD [place] Surgeon

## 2019-02-16 NOTE — H&P (View-Only) (Signed)
Outpatient Surgical Follow Up  02/16/2019  Gloria Allen is an 73 y.o. female.   Chief Complaint  Patient presents with  . Routine Post Op    Sigmoid colectomy    HPI: s/p LAR for CA.  She has refused adjuvant chemotherapy.  She did have right upper lobe mass on PET/CT that prompted a biopsy and results show evidence of small cell CA. adamant that she does not want to do any chemo or radiation therapy.  She now complains of some rectal pain.  No hematochezia.  She does have some occasional mucus BM via the anus.  Experienced some pressure on the lower portion.  No fevers no chills.  Her ostomy is working well.  She is gaining weight she is walking.  There is good output from the loop ileostomy. I am to know about the pathology results and I asked her to get in touch with the oncology office.  I also messaged Dr. Janese Banks and they will get in touch with her  Past Medical History:  Diagnosis Date  . Anxiety   . Arthritis   . Asthma   . Cancer ([place])   . Cervical central spinal stenosis (C3-C7) (worse at C4-5) 07/30/2017  . Cervical foraminal stenosis (C4-5 and C5-6) (Bilateral) (L>R) 07/30/2017  . Chronic lower extremity pain (Fourth Area of Pain) (Bilateral) (R>L) 06/11/2017  . Chronic neck pain (Primary Area of Pain) (Bilateral) (R>L) 06/11/2017  . Chronic sacroiliac joint pain (Bilateral) (R>L) 06/11/2017  . Chronic shoulder pain [facility] Area of Pain) (Right) 06/11/2017  . Chronic upper extremity pain (Fifth Area of Pain) (Bilateral) (R>L) 06/11/2017  . Colon cancer ([place])   . DDD (degenerative disc disease), cervical 07/30/2017  . DDD (degenerative disc disease), lumbar 07/30/2017  . Depression   . DISH (diffuse idiopathic skeletal hyperostosis) 07/30/2017  . Dyspnea    with exertion  . Entrapment syndrome 06/20/2017   2002 on R and 2010 on L  . Full thickness rotator cuff tear 06/12/2017  . GERD (gastroesophageal reflux disease)   . Grade 1 Anterolisthesis of L4 over L5 07/30/2017  .  Headache   . Hx: UTI (urinary tract infection)   . Hypertension   . Inflammation of joint of shoulder region 01/15/2017  . Lumbar central spinal stenosis (L4-5) 07/30/2017  . Lumbar disc protrusion (Left: L5-S1) (Right: L1-2) 07/30/2017   L5-S1 left foraminal protrusion with L5 impingement. L1-2 right paracentral protrusion without impingement.  . Lumbar facet arthropathy (Bilateral) 07/30/2017  . Lumbar facet syndrome (Bilateral) (R>L) 07/30/2017  . Osteoarthritis of shoulder (Right) 01/15/2017    Past Surgical History:  Procedure Laterality Date  . ABDOMINAL HYSTERECTOMY    . APPENDECTOMY    . BREAST REDUCTION SURGERY Bilateral   . COLONOSCOPY WITH PROPOFOL N/A 11/05/2018   Procedure: COLONOSCOPY WITH PROPOFOL;  Surgeon: Virgel Manifold, MD;  Location: [facility];  Service: Endoscopy;  Laterality: N/A;  . COLONOSCOPY WITH PROPOFOL N/A 11/06/2018   Procedure: COLONOSCOPY WITH PROPOFOL;  Surgeon: Virgel Manifold, MD;  Location: [facility];  Service: Endoscopy;  Laterality: N/A;  . COLOSTOMY    . DILATION AND CURETTAGE OF UTERUS    . ESOPHAGOGASTRODUODENOSCOPY (EGD) WITH PROPOFOL N/A 11/05/2018   Procedure: ESOPHAGOGASTRODUODENOSCOPY (EGD) WITH PROPOFOL;  Surgeon: Virgel Manifold, MD;  Location: [facility];  Service: Endoscopy;  Laterality: N/A;  . ILEOSTOMY Right 11/27/2018   Procedure: ILEOSTOMY;  Surgeon: Jules Husbands, MD;  Location: [facility];  Service: General;  Laterality: Right;  . LAPAROSCOPIC SIGMOID  COLECTOMY N/A 11/27/2018   Procedure: LAPAROSCOPIC SIGMOID COLECTOMY;  Surgeon: Jules Husbands, MD;  Location: [facility];  Service: General;  Laterality: N/A;  . ORIF ANKLE FRACTURE Right 11/21/2018   Procedure: OPEN REDUCTION INTERNAL FIXATION (ORIF) ANKLE FRACTURE;  Surgeon: Corky Mull, MD;  Location: [facility] ORS;  Service: Orthopedics;  Laterality: Right;  . REDUCTION MAMMAPLASTY    . SHOULDER ARTHROSCOPY WITH OPEN ROTATOR CUFF REPAIR AND DISTAL CLAVICLE  ACROMINECTOMY Right 03/26/2017   Procedure: right shoulder arthroscopy, arthroscopic subacromial decompression, distal clavicle excision, mini open rotator cuff repair;  Surgeon: Thornton Park, MD;  Location: [facility] ORS;  Service: Orthopedics;  Laterality: Right;    Family History  Problem Relation Age of Onset  . Hypertension Mother   . Breast cancer Mother 44  . Breast cancer Sister 62    Social History:  reports that she quit smoking about 10 years ago. Her smoking use included cigarettes. She smoked 1.00 pack per day. She has never used smokeless tobacco. She reports that she does not drink alcohol or use drugs.  Allergies:  Allergies  Allergen Reactions  . Ace Inhibitors Swelling  . Angiotensin Receptor Blockers Swelling  . Sulfa Antibiotics Itching  . Chlorthalidone Rash  . Flexeril [Cyclobenzaprine] Rash    Medications reviewed.    ROS Full ROS performed and is otherwise negative other than what is stated in HPI   BP (!) 194/88   Pulse 78   Temp (!) 97.5 F (36.4 C) (Temporal)   Resp 16   Ht 5\' 5"  (1.651 m)   Wt 150 lb 6.4 oz (68.2 kg)   SpO2 97%   BMI 25.03 kg/m   Physical Exam Vitals signs and nursing note reviewed. Exam conducted with a chaperone present.  Constitutional:      General: She is not in acute distress.    Appearance: Normal appearance. She is normal weight.  Neck:     Musculoskeletal: Normal range of motion and neck supple.  Cardiovascular:     Rate and Rhythm: Normal rate.     Pulses: Normal pulses.     Heart sounds: No friction rub.  Pulmonary:     Effort: Pulmonary effort is normal.     Breath sounds: No stridor.  Abdominal:     General: Abdomen is flat. There is no distension.     Palpations: There is no mass.     Tenderness: There is no abdominal tenderness. There is no guarding or rebound.     Hernia: No hernia is present.     Comments: Ileostomy working , pink patent.  Genitourinary:    Comments: Rectal no blood, no masses  no obvious lesions. Musculoskeletal: Normal range of motion.        General: No swelling or tenderness.  Skin:    General: Skin is warm and dry.     Capillary Refill: Capillary refill takes less than 2 seconds.  Neurological:     General: No focal deficit present.     Mental Status: She is alert and oriented to person, place, and time.  Psychiatric:        Mood and Affect: Mood normal.        Behavior: Behavior normal.        Thought Content: Thought content normal.        Judgment: Judgment normal.    Assessment/Plan: 73 year old female status post anterior resection now with rectal pain.  Will start a work-up to include a barium enema.  She probably will  need flex sig in the near future.  Also discussed with her in detail about next steps.  Encouraged her to follow-up with oncology regarding her small cell lung cancer. We will do only 1 refill of pain medication since she reports daily rectal pain.  Only 20 pills of Percocet Need for surgical intervention.  RTC 2 to 3 weeks.  1. Rectal pain - DG BE (COLON)W SINGLE CM (SOL OR THIN BA); Future    Greater than 50% of the 25 minutes  visit was spent in counseling/coordination of care   Caroleen Hamman, MD [place] Surgeon

## 2019-02-16 NOTE — Patient Instructions (Addendum)
You are scheduled for a Barium Enema at [facility] on 02/25/19 at 10:00 am. You will need to arrive there by 9:45 am and go in through the [facility] entrance. You will have noting to drink after midnight the night prior to your enema. Please refer to instruction sheet for your prep.  Follow up here in 2 weeks after that.

## 2019-02-18 DIAGNOSIS — M4802 Spinal stenosis, cervical region: Secondary | ICD-10-CM | POA: Diagnosis not present

## 2019-02-18 DIAGNOSIS — K219 Gastro-esophageal reflux disease without esophagitis: Secondary | ICD-10-CM | POA: Diagnosis not present

## 2019-02-18 DIAGNOSIS — M80062D Age-related osteoporosis with current pathological fracture, left lower leg, subsequent encounter for fracture with routine healing: Secondary | ICD-10-CM | POA: Diagnosis not present

## 2019-02-18 DIAGNOSIS — Z7901 Long term (current) use of anticoagulants: Secondary | ICD-10-CM | POA: Diagnosis not present

## 2019-02-18 DIAGNOSIS — M7500 Adhesive capsulitis of unspecified shoulder: Secondary | ICD-10-CM | POA: Diagnosis not present

## 2019-02-18 DIAGNOSIS — M4692 Unspecified inflammatory spondylopathy, cervical region: Secondary | ICD-10-CM | POA: Diagnosis not present

## 2019-02-18 DIAGNOSIS — Z85038 Personal history of other malignant neoplasm of large intestine: Secondary | ICD-10-CM | POA: Diagnosis not present

## 2019-02-18 DIAGNOSIS — Z87440 Personal history of urinary (tract) infections: Secondary | ICD-10-CM | POA: Diagnosis not present

## 2019-02-18 DIAGNOSIS — M48061 Spinal stenosis, lumbar region without neurogenic claudication: Secondary | ICD-10-CM | POA: Diagnosis not present

## 2019-02-18 DIAGNOSIS — I10 Essential (primary) hypertension: Secondary | ICD-10-CM | POA: Diagnosis not present

## 2019-02-18 DIAGNOSIS — Z433 Encounter for attention to colostomy: Secondary | ICD-10-CM | POA: Diagnosis not present

## 2019-02-18 DIAGNOSIS — J40 Bronchitis, not specified as acute or chronic: Secondary | ICD-10-CM | POA: Diagnosis not present

## 2019-02-18 DIAGNOSIS — G894 Chronic pain syndrome: Secondary | ICD-10-CM | POA: Diagnosis not present

## 2019-02-18 DIAGNOSIS — M450 Ankylosing spondylitis of multiple sites in spine: Secondary | ICD-10-CM | POA: Diagnosis not present

## 2019-02-18 DIAGNOSIS — Z9181 History of falling: Secondary | ICD-10-CM | POA: Diagnosis not present

## 2019-02-18 DIAGNOSIS — M80071D Age-related osteoporosis with current pathological fracture, right ankle and foot, subsequent encounter for fracture with routine healing: Secondary | ICD-10-CM | POA: Diagnosis not present

## 2019-02-19 ENCOUNTER — Other Ambulatory Visit: Payer: Medicare Other

## 2019-02-19 DIAGNOSIS — M80062D Age-related osteoporosis with current pathological fracture, left lower leg, subsequent encounter for fracture with routine healing: Secondary | ICD-10-CM | POA: Diagnosis not present

## 2019-02-19 DIAGNOSIS — Z9181 History of falling: Secondary | ICD-10-CM | POA: Diagnosis not present

## 2019-02-19 DIAGNOSIS — M450 Ankylosing spondylitis of multiple sites in spine: Secondary | ICD-10-CM | POA: Diagnosis not present

## 2019-02-19 DIAGNOSIS — Z433 Encounter for attention to colostomy: Secondary | ICD-10-CM | POA: Diagnosis not present

## 2019-02-19 DIAGNOSIS — Z85038 Personal history of other malignant neoplasm of large intestine: Secondary | ICD-10-CM | POA: Diagnosis not present

## 2019-02-19 DIAGNOSIS — M48061 Spinal stenosis, lumbar region without neurogenic claudication: Secondary | ICD-10-CM | POA: Diagnosis not present

## 2019-02-19 DIAGNOSIS — M80071D Age-related osteoporosis with current pathological fracture, right ankle and foot, subsequent encounter for fracture with routine healing: Secondary | ICD-10-CM | POA: Diagnosis not present

## 2019-02-19 DIAGNOSIS — I10 Essential (primary) hypertension: Secondary | ICD-10-CM | POA: Diagnosis not present

## 2019-02-19 DIAGNOSIS — Z7901 Long term (current) use of anticoagulants: Secondary | ICD-10-CM | POA: Diagnosis not present

## 2019-02-19 DIAGNOSIS — Z87440 Personal history of urinary (tract) infections: Secondary | ICD-10-CM | POA: Diagnosis not present

## 2019-02-19 DIAGNOSIS — M4802 Spinal stenosis, cervical region: Secondary | ICD-10-CM | POA: Diagnosis not present

## 2019-02-19 DIAGNOSIS — K219 Gastro-esophageal reflux disease without esophagitis: Secondary | ICD-10-CM | POA: Diagnosis not present

## 2019-02-19 DIAGNOSIS — M7500 Adhesive capsulitis of unspecified shoulder: Secondary | ICD-10-CM | POA: Diagnosis not present

## 2019-02-19 DIAGNOSIS — M4692 Unspecified inflammatory spondylopathy, cervical region: Secondary | ICD-10-CM | POA: Diagnosis not present

## 2019-02-19 DIAGNOSIS — J40 Bronchitis, not specified as acute or chronic: Secondary | ICD-10-CM | POA: Diagnosis not present

## 2019-02-19 DIAGNOSIS — G894 Chronic pain syndrome: Secondary | ICD-10-CM | POA: Diagnosis not present

## 2019-02-19 NOTE — Progress Notes (Signed)
Tumor Board Documentation  ARIANNIE PENALOZA was presented by Dr Janese Banks at our Tumor Board on 02/19/2019, which included representatives from radiation oncology, medical oncology, surgical oncology, surgical, radiology, pathology, navigation, internal medicine, research, palliative care, pulmonology.  Andreina currently presents as a new patient, for new positive pathology, for [place], for discussion with history of the following treatments: surgical intervention(s), active survellience.  Additionally, we reviewed previous medical and familial history, history of present illness, and recent lab results along with all available histopathologic and imaging studies. The tumor board considered available treatment options and made the following recommendations: Concurrent chemo-radiation therapy    The following procedures/referrals were also placed: No orders of the defined types were placed in this encounter.   Clinical Trial Status: not discussed   Staging used: AJCC Stage Group  AJCC Staging: T: pT3 N: N1a M: Mx Group: Stage 3 B colon Cancer   National site-specific guidelines NCCN were discussed with respect to the case.  Tumor board is a meeting of clinicians from various specialty areas who evaluate and discuss patients for whom a multidisciplinary approach is being considered. Final determinations in the plan of care are those of the provider(s). The responsibility for follow up of recommendations given during tumor board is that of the provider.   Today's extended care, comprehensive team conference, Kimm was not present for the discussion and was not examined.   Multidisciplinary Tumor Board is a multidisciplinary case peer review process.  Decisions discussed in the Multidisciplinary Tumor Board reflect the opinions of the specialists present at the conference without having examined the patient.  Ultimately, treatment and diagnostic decisions rest with the primary provider(s) and the  patient.

## 2019-02-21 DIAGNOSIS — Z433 Encounter for attention to colostomy: Secondary | ICD-10-CM | POA: Diagnosis not present

## 2019-02-21 DIAGNOSIS — M4802 Spinal stenosis, cervical region: Secondary | ICD-10-CM | POA: Diagnosis not present

## 2019-02-21 DIAGNOSIS — M7500 Adhesive capsulitis of unspecified shoulder: Secondary | ICD-10-CM | POA: Diagnosis not present

## 2019-02-21 DIAGNOSIS — G894 Chronic pain syndrome: Secondary | ICD-10-CM | POA: Diagnosis not present

## 2019-02-21 DIAGNOSIS — M4692 Unspecified inflammatory spondylopathy, cervical region: Secondary | ICD-10-CM | POA: Diagnosis not present

## 2019-02-21 DIAGNOSIS — I10 Essential (primary) hypertension: Secondary | ICD-10-CM | POA: Diagnosis not present

## 2019-02-21 DIAGNOSIS — Z85038 Personal history of other malignant neoplasm of large intestine: Secondary | ICD-10-CM | POA: Diagnosis not present

## 2019-02-21 DIAGNOSIS — M80062D Age-related osteoporosis with current pathological fracture, left lower leg, subsequent encounter for fracture with routine healing: Secondary | ICD-10-CM | POA: Diagnosis not present

## 2019-02-21 DIAGNOSIS — Z9181 History of falling: Secondary | ICD-10-CM | POA: Diagnosis not present

## 2019-02-21 DIAGNOSIS — Z7901 Long term (current) use of anticoagulants: Secondary | ICD-10-CM | POA: Diagnosis not present

## 2019-02-21 DIAGNOSIS — Z87440 Personal history of urinary (tract) infections: Secondary | ICD-10-CM | POA: Diagnosis not present

## 2019-02-21 DIAGNOSIS — M48061 Spinal stenosis, lumbar region without neurogenic claudication: Secondary | ICD-10-CM | POA: Diagnosis not present

## 2019-02-21 DIAGNOSIS — M450 Ankylosing spondylitis of multiple sites in spine: Secondary | ICD-10-CM | POA: Diagnosis not present

## 2019-02-21 DIAGNOSIS — J40 Bronchitis, not specified as acute or chronic: Secondary | ICD-10-CM | POA: Diagnosis not present

## 2019-02-21 DIAGNOSIS — K219 Gastro-esophageal reflux disease without esophagitis: Secondary | ICD-10-CM | POA: Diagnosis not present

## 2019-02-21 DIAGNOSIS — M80071D Age-related osteoporosis with current pathological fracture, right ankle and foot, subsequent encounter for fracture with routine healing: Secondary | ICD-10-CM | POA: Diagnosis not present

## 2019-02-23 DIAGNOSIS — S82841S Displaced bimalleolar fracture of right lower leg, sequela: Secondary | ICD-10-CM | POA: Diagnosis not present

## 2019-02-24 ENCOUNTER — Inpatient Hospital Stay: Payer: Medicare Other | Attending: Oncology | Admitting: Oncology

## 2019-02-24 ENCOUNTER — Other Ambulatory Visit: Payer: Self-pay | Admitting: *Deleted

## 2019-02-24 ENCOUNTER — Encounter: Payer: Self-pay | Admitting: *Deleted

## 2019-02-24 ENCOUNTER — Ambulatory Visit: Payer: Medicare Other

## 2019-02-24 ENCOUNTER — Other Ambulatory Visit: Payer: Self-pay

## 2019-02-24 ENCOUNTER — Encounter: Payer: Self-pay | Admitting: Oncology

## 2019-02-24 VITALS — BP 147/81 | HR 52 | Temp 97.3°F | Resp 18 | Ht 65.0 in | Wt 148.7 lb

## 2019-02-24 DIAGNOSIS — Z79899 Other long term (current) drug therapy: Secondary | ICD-10-CM | POA: Insufficient documentation

## 2019-02-24 DIAGNOSIS — Z7189 Other specified counseling: Secondary | ICD-10-CM

## 2019-02-24 DIAGNOSIS — F419 Anxiety disorder, unspecified: Secondary | ICD-10-CM | POA: Insufficient documentation

## 2019-02-24 DIAGNOSIS — Z87891 Personal history of nicotine dependence: Secondary | ICD-10-CM | POA: Diagnosis not present

## 2019-02-24 DIAGNOSIS — Z803 Family history of malignant neoplasm of breast: Secondary | ICD-10-CM | POA: Diagnosis not present

## 2019-02-24 DIAGNOSIS — C3490 Malignant neoplasm of unspecified part of unspecified bronchus or lung: Secondary | ICD-10-CM | POA: Insufficient documentation

## 2019-02-24 DIAGNOSIS — C3411 Malignant neoplasm of upper lobe, right bronchus or lung: Secondary | ICD-10-CM | POA: Diagnosis not present

## 2019-02-24 DIAGNOSIS — Z85038 Personal history of other malignant neoplasm of large intestine: Secondary | ICD-10-CM | POA: Diagnosis not present

## 2019-02-24 DIAGNOSIS — I10 Essential (primary) hypertension: Secondary | ICD-10-CM | POA: Diagnosis not present

## 2019-02-24 DIAGNOSIS — F329 Major depressive disorder, single episode, unspecified: Secondary | ICD-10-CM | POA: Diagnosis not present

## 2019-02-24 MED ORDER — LORAZEPAM 0.5 MG PO TABS: 0.5000 mg | ORAL_TABLET | Freq: Three times a day (TID) | ORAL | 0 refills | Status: DC | PRN

## 2019-02-24 NOTE — Progress Notes (Signed)
  Oncology Nurse Navigator Documentation  Navigator Location: [facility] (02/24/19 1200)   )Navigator Encounter Type: Follow-up Appt (02/24/19 1200)   Abnormal Finding Date: 01/22/19 (02/24/19 1200) Confirmed Diagnosis Date: 02/16/19 (02/24/19 1200)               Patient Visit Type: MedOnc (02/24/19 1200) Treatment Phase: Pre-Tx/Tx Discussion (02/24/19 1200) Barriers/Navigation Needs: Education;Coordination of Care (02/24/19 1200) Education: Understanding Cancer/ Treatment Options;Newly Diagnosed Cancer Education (02/24/19 1200) Interventions: Coordination of Care;Education (02/24/19 1200)   Coordination of Care: Appts;Radiology (02/24/19 1200) Education Method: Verbal;Written (02/24/19 1200)      Acuity: Level 2 (02/24/19 1200)   Acuity Level 2: Initial guidance, education and coordination as needed;Educational needs;Assistance expediting appointments (02/24/19 1200)  met with patient during follow up visit with Dr. Janese Banks to discuss pathology results and treatment options. All questions answered during visit. Pt given resources regarding supportive services and diagnosis. Reviewed upcoming appts and informed patient that I will follow up with her on Friday at her next appt with Dr. Baruch Gouty. Pt stated that she has a good friend that will call me this week to ask further questions regarding her treatment options. Pt has given me permission to discuss her cancer diagnosis and treatment options with her friend, Trena Platt. Contact info given and instructed pt call with any further questions or needs. Pt verbalized understanding.   Time Spent with Patient: 60 (02/24/19 1200)

## 2019-02-24 NOTE — Progress Notes (Signed)
Hematology/Oncology Consult note [facility]  [phone] [phone]  Patient Care Team: Inc, [facility] as PCP - General Telford Nab, [place] as Registered Nurse   Name of the patient: Gloria Allen  [id number]  [birth year]   Date of visit: 02/24/19  Diagnosis- 1.  Stage III colon cancer status post surgery 2.  New diagnosis of limited stage small cell lung cancer  Chief complaint/ Reason for visit-discuss lung biopsy results and further management  Heme/Onc history: patient is a 73 year old African-American female who recently underwent screening colonoscopy by Dr. Bonna Gains.Colonoscopy showed villous nonobstructing large mass in the sigmoid colon 12 cm proximal to the anus. Mass was partially circumferential. Biopsy showed invasive adenocarcinoma. Patient was seen by Dr. Dahlia Byes and underwent hemicolectomy with lymph node sampling on 11/27/2018. Final pathology showed invasive colorectal carcinoma 3.5 cm, grade 2. Tumor invades through muscularis propria into the peri-colorectal tissue. Margins negative. Tumor deposits present, 2. 1 out of 9 lymph nodes examined was positive for malignancy. [id number].  Patient was feeling poorly after surgery and she had no social support and decided against adjuvant chemotherapy.  Patient underwent CT chest to complete her staging work-up which showed 2 lung nodules one in the right upper lobe and one in the left left upper lobe.  This was followed by a PET CT scan which showed with the left upper lobe nodule was not hypermetabolic.The right upper lobe lung nodule measure 1.6 cm with an SUV of 6.8.  No hypermetabolic axillary mediastinal or hilar lymphadenopathy or evidence of metastatic disease elsewhere.  Patient underwent CT-guided biopsy of the right upper lobe lung nodule which was consistent with small cell lung cancer positive for CD56 and TTF-1  Interval history-patient reports feeling  anxious.  Overall she feels much better since her last visit.  She continues to live alone and is independent of her ADLs  ECOG PS- 1 Pain scale- 0 Opioid associated constipation- no  Review of systems- Review of Systems  Constitutional: Positive for malaise/fatigue. Negative for chills, fever and weight loss.  HENT: Negative for congestion, ear discharge and nosebleeds.   Eyes: Negative for blurred vision.  Respiratory: Negative for cough, hemoptysis, sputum production, shortness of breath and wheezing.   Cardiovascular: Negative for chest pain, palpitations, orthopnea and claudication.  Gastrointestinal: Negative for abdominal pain, blood in stool, constipation, diarrhea, heartburn, melena, nausea and vomiting.  Genitourinary: Negative for dysuria, flank pain, frequency, hematuria and urgency.  Musculoskeletal: Negative for back pain, joint pain and myalgias.  Skin: Negative for rash.  Neurological: Negative for dizziness, tingling, focal weakness, seizures, weakness and headaches.  Endo/Heme/Allergies: Does not bruise/bleed easily.  Psychiatric/Behavioral: Negative for depression and suicidal ideas. The patient is nervous/anxious. The patient does not have insomnia.       Allergies  Allergen Reactions  . Ace Inhibitors Swelling  . Angiotensin Receptor Blockers Swelling  . Sulfa Antibiotics Itching  . Chlorthalidone Rash  . Flexeril [Cyclobenzaprine] Rash     Past Medical History:  Diagnosis Date  . Anxiety   . Arthritis   . Asthma   . Cancer ([place])   . Cervical central spinal stenosis (C3-C7) (worse at C4-5) 07/30/2017  . Cervical foraminal stenosis (C4-5 and C5-6) (Bilateral) (L>R) 07/30/2017  . Chronic lower extremity pain (Fourth Area of Pain) (Bilateral) (R>L) 06/11/2017  . Chronic neck pain (Primary Area of Pain) (Bilateral) (R>L) 06/11/2017  . Chronic sacroiliac joint pain (Bilateral) (R>L) 06/11/2017  . Chronic shoulder pain (Tertiary Area of Pain) (  Right) 06/11/2017   . Chronic upper extremity pain (Fifth Area of Pain) (Bilateral) (R>L) 06/11/2017  . Colon cancer ([place])   . DDD (degenerative disc disease), cervical 07/30/2017  . DDD (degenerative disc disease), lumbar 07/30/2017  . Depression   . DISH (diffuse idiopathic skeletal hyperostosis) 07/30/2017  . Dyspnea    with exertion  . Entrapment syndrome 06/20/2017   2002 on R and 2010 on L  . Full thickness rotator cuff tear 06/12/2017  . GERD (gastroesophageal reflux disease)   . Grade 1 Anterolisthesis of L4 over L5 07/30/2017  . Headache   . Hx: UTI (urinary tract infection)   . Hypertension   . Inflammation of joint of shoulder region 01/15/2017  . Lumbar central spinal stenosis (L4-5) 07/30/2017  . Lumbar disc protrusion (Left: L5-S1) (Right: L1-2) 07/30/2017   L5-S1 left foraminal protrusion with L5 impingement. L1-2 right paracentral protrusion without impingement.  . Lumbar facet arthropathy (Bilateral) 07/30/2017  . Lumbar facet syndrome (Bilateral) (R>L) 07/30/2017  . Lung cancer ([place])   . Osteoarthritis of shoulder (Right) 01/15/2017     Past Surgical History:  Procedure Laterality Date  . ABDOMINAL HYSTERECTOMY    . APPENDECTOMY    . BREAST REDUCTION SURGERY Bilateral   . COLONOSCOPY WITH PROPOFOL N/A 11/05/2018   Procedure: COLONOSCOPY WITH PROPOFOL;  Surgeon: Virgel Manifold, MD;  Location: [facility]COPY;  Service: Endoscopy;  Laterality: N/A;  . COLONOSCOPY WITH PROPOFOL N/A 11/06/2018   Procedure: COLONOSCOPY WITH PROPOFOL;  Surgeon: Virgel Manifold, MD;  Location: [facility];  Service: Endoscopy;  Laterality: N/A;  . COLOSTOMY    . DILATION AND CURETTAGE OF UTERUS    . ESOPHAGOGASTRODUODENOSCOPY (EGD) WITH PROPOFOL N/A 11/05/2018   Procedure: ESOPHAGOGASTRODUODENOSCOPY (EGD) WITH PROPOFOL;  Surgeon: Virgel Manifold, MD;  Location: [facility] ENDOSCOPY;  Service: Endoscopy;  Laterality: N/A;  . ILEOSTOMY Right 11/27/2018   Procedure: ILEOSTOMY;  Surgeon: Jules Husbands,  MD;  Location: [facility] ORS;  Service: General;  Laterality: Right;  . LAPAROSCOPIC SIGMOID COLECTOMY N/A 11/27/2018   Procedure: LAPAROSCOPIC SIGMOID COLECTOMY;  Surgeon: Jules Husbands, MD;  Location: [facility] OR[facility];  Service: General;  Laterality: N/A;  . ORIF ANKLE FRACTURE Right 11/21/2018   Procedure: OPEN REDUCTION INTERNAL FIXATION (ORIF) ANKLE FRACTURE;  Surgeon: Corky Mull, MD;  Location: [facility] OR[facility];  Service: Orthopedics;  Laterality: Right;  . REDUCTION MAMMAPLASTY    . SHOULDER ARTHROSCOPY WITH OPEN ROTATOR CUFF REPAIR AND DISTAL CLAVICLE ACROMINECTOMY Right 03/26/2017   Procedure: right shoulder arthroscopy, arthroscopic subacromial decompression, distal clavicle excision, mini open rotator cuff repair;  Surgeon: Thornton Park, MD;  Location: [facility] ORS;  Service: Orthopedics;  Laterality: Right;    Social History   Socioeconomic History  . Marital status: Divorced    Spouse name: Not on file  . Number of children: 3  . Years of education: Not on file  . Highest education level: 11th grade  Occupational History  . Not on file  Social Needs  . Financial resource strain: Somewhat hard  . Food insecurity:    Worry: Sometimes true    Inability: Never true  . Transportation needs:    Medical: Yes    Non-medical: No  Tobacco Use  . Smoking status: Former Smoker    Packs/day: 1.00    Types: Cigarettes    Last attempt to quit: 2010    Years since quitting: 10.4  . Smokeless tobacco: Never Used  . Tobacco comment: quit over 30 years ago   Substance and  Sexual Activity  . Alcohol use: No  . Drug use: No  . Sexual activity: Never  Lifestyle  . Physical activity:    Days per week: 0 days    Minutes per session: 0 min  . Stress: Not at all  Relationships  . Social connections:    Talks on phone: Never    Gets together: Never    Attends religious service: More than 4 times per year    Active member of club or organization: No    Attends meetings of clubs or organizations:  Never    Relationship status: Divorced  . Intimate partner violence:    Fear of current or ex partner: No    Emotionally abused: No    Physically abused: No    Forced sexual activity: No  Other Topics Concern  . Not on file  Social History Narrative  . Not on file    Family History  Problem Relation Age of Onset  . Hypertension Mother   . Breast cancer Mother 91  . Breast cancer Sister 51     Current Outpatient Medications:  .  cloNIDine (CATAPRES) 0.3 MG tablet, Take 2 tablets (0.6 mg total) by mouth 2 (two) times daily. (Patient taking differently: Take 0.3 mg by mouth 2 (two) times daily. ), Disp: 120 tablet, Rfl: 2 .  cyclobenzaprine (FLEXERIL) 5 MG tablet, Take 5 mg by mouth at bedtime., Disp: , Rfl:  .  hydrALAZINE (APRESOLINE) 100 MG tablet, Take 1 tablet (100 mg total) by mouth 3 (three) times daily. (Patient taking differently: Take 100 mg by mouth daily. ), Disp: 90 tablet, Rfl: 1 .  hydrOXYzine (ATARAX/VISTARIL) 10 MG tablet, TAKE 1 TABLET BY MOUTH THREE TIMES A DAY AS NEEDED (Patient taking differently: Take 10 mg by mouth 3 (three) times daily as needed for itching. ), Disp: 90 tablet, Rfl: 0 .  loperamide (IMODIUM) 2 MG capsule, Take 1 capsule (2 mg total) by mouth 3 (three) times daily., Disp: 60 capsule, Rfl: 1 .  lovastatin (MEVACOR) 40 MG tablet, Take 1 tablet (40 mg total) by mouth at bedtime., Disp: 90 tablet, Rfl: 1 .  megestrol (MEGACE) 400 MG/10ML suspension, Take 10 mLs (400 mg total) by mouth daily for 30 doses., Disp: 300 mL, Rfl: 0 .  methocarbamol (ROBAXIN) 500 MG tablet, Take 1 tablet (500 mg total) by mouth at bedtime as needed for muscle spasms., Disp: 30 tablet, Rfl: 0 .  metoprolol succinate (TOPROL-XL) 100 MG 24 hr tablet, TAKE 1 TABLET BY MOUTH DAILY, Disp: 30 tablet, Rfl: 0 .  mirtazapine (REMERON) 15 MG tablet, take 1 tablet by mouth at bedtime for APPETITIE (Patient taking differently: Take 15 mg by mouth at bedtime. take 1 tablet by mouth at  bedtime for APPETITIE), Disp: 180 tablet, Rfl: 0 .  oxybutynin (DITROPAN) 5 MG tablet, TAKE 1 TABLET BY MOUTH TWICE DAILY (Patient taking differently: Take 5 mg by mouth 2 (two) times daily. ), Disp: 180 tablet, Rfl: 0 .  oxyCODONE-acetaminophen (PERCOCET/ROXICET) 5-325 MG tablet, Take 1 tablet by mouth every 6 (six) hours as needed for severe pain., Disp: 20 tablet, Rfl: 0 .  Potassium 99 MG TABS, Take 1 tablet by mouth daily as needed (cramps). , Disp: , Rfl:  .  sertraline (ZOLOFT) 100 MG tablet, Take 2 tablets (200 mg total) by mouth at bedtime., Disp: 60 tablet, Rfl: 3 .  triamcinolone cream (KENALOG) 0.1 %, APPLY TOPICALLY TWICE DAILY AS NEEDED FOR ITCHING (Patient taking differently: Apply 1 application  topically 2 (two) times daily as needed (itching). ), Disp: 30 g, Rfl: 0 .  albuterol (PROVENTIL HFA;VENTOLIN HFA) 108 (90 Base) MCG/ACT inhaler, Inhale 2 puffs into the lungs every 6 (six) hours as needed for wheezing or shortness of breath. (Patient not taking: Reported on 02/24/2019), Disp: 1 Inhaler, Rfl: 3 .  ANORO ELLIPTA 62.5-25 MCG/INH AEPB, INHALE 1 PUFF BY MOUTH DAILY (Patient not taking: Reported on 02/24/2019), Disp: 60 each, Rfl: 0 .  BREO ELLIPTA 200-25 MCG/INH AEPB, Inhale 1 puff into the lungs daily. , Disp: , Rfl:  .  clonazePAM (KLONOPIN) 0.5 MG tablet, Take 0.5 mg by mouth 3 (three) times daily. , Disp: , Rfl:  .  LORazepam (ATIVAN) 0.5 MG tablet, Take 1 tablet (0.5 mg total) by mouth every 8 (eight) hours as needed for anxiety., Disp: 90 tablet, Rfl: 0 .  meclizine (ANTIVERT) 12.5 MG tablet, Take 12.5 mg by mouth 3 (three) times daily as needed for dizziness. , Disp: , Rfl: 0 .  montelukast (SINGULAIR) 10 MG tablet, Take 1 tablet (10 mg total) by mouth at bedtime as needed. (Patient not taking: Reported on 02/24/2019), Disp: 90 tablet, Rfl: 1 .  omeprazole (PRILOSEC) 40 MG capsule, Take 1 capsule (40 mg total) by mouth 2 (two) times daily. (Patient not taking: Reported on  02/24/2019), Disp: 60 capsule, Rfl: 2 .  terazosin (HYTRIN) 5 MG capsule, TAKE 1 CAPSULE BY MOUTH ONCE DAILY AT BEDTIME, Disp: 30 capsule, Rfl: 0 .  traZODone (DESYREL) 50 MG tablet, Take 0.5-1 tablets (25-50 mg total) by mouth at bedtime as needed for sleep. (Patient not taking: Reported on 02/24/2019), Disp: 30 tablet, Rfl: 3  Physical exam:  Vitals:   02/24/19 1045  BP: (!) 147/81  Pulse: (!) 52  Resp: 18  Temp: (!) 97.3 F (36.3 C)  TempSrc: Tympanic  Weight: 148 lb 11.2 oz (67.4 kg)  Height: 5\' 5"  (1.651 m)   Physical Exam Constitutional:      General: She is not in acute distress. HENT:     Head: Normocephalic and atraumatic.  Eyes:     Pupils: Pupils are equal, round, and reactive to light.  Neck:     Musculoskeletal: Normal range of motion.  Cardiovascular:     Rate and Rhythm: Normal rate and regular rhythm.     Heart sounds: Normal heart sounds.  Pulmonary:     Effort: Pulmonary effort is normal.     Breath sounds: Normal breath sounds.  Abdominal:     General: Bowel sounds are normal.     Palpations: Abdomen is soft.  Skin:    General: Skin is warm and dry.  Neurological:     Mental Status: She is alert and oriented to person, place, and time.      CMP Latest Ref Rng & Units 12/19/2018  Glucose 70 - 99 mg/dL 126(H)  BUN 8 - 23 mg/dL 9  Creatinine 0.44 - 1.00 mg/dL 0.62  Sodium 135 - 145 mmol/L 138  Potassium 3.5 - 5.1 mmol/L 3.9  Chloride 98 - 111 mmol/L 105  CO2 22 - 32 mmol/L 25  Calcium 8.9 - 10.3 mg/dL 9.5  Total Protein 6.5 - 8.1 g/dL 7.7  Total Bilirubin 0.3 - 1.2 mg/dL 0.3  Alkaline Phos 38 - 126 U/L 109  AST 15 - 41 U/L 21  ALT 0 - 44 U/L 17   CBC Latest Ref Rng & Units 02/12/2019  WBC 4.0 - 10.5 K/uL 12.4(H)  Hemoglobin 12.0 - 15.0 g/dL  12.4  Hematocrit 36.0 - 46.0 % 39.2  Platelets 150 - 400 K/uL 321    No images are attached to the encounter.  Dg Chest 1 View  Result Date: 02/12/2019 CLINICAL DATA:  Status post lung biopsy EXAM:  CHEST  1 VIEW COMPARISON:  02/12/2019 FINDINGS: Posteromedial right upper lobe nodule is not well appreciated by plain radiography. Tiny right apical pneumothorax suspected. No significant consolidation or collapse. Negative for edema or effusion. Stable cardiomegaly and vascularity. Trachea midline. Degenerative changes of the spine and shoulders. IMPRESSION: Very small right apical pneumothorax following posteromedial right upper lobe nodule biopsy. Negative for effusion. Electronically Signed   By: Jerilynn Mages.  Shick M.D.   On: 02/12/2019 13:33   Nm Pet Image Initial (pi) Skull Base To Thigh  Result Date: 02/05/2019 CLINICAL DATA:  Initial treatment strategy for sigmoid colon cancer status post sigmoid colectomy 11/27/2018, with indeterminate pulmonary nodules on recent chest CT. EXAM: NUCLEAR MEDICINE PET SKULL BASE TO THIGH TECHNIQUE: 7.5 mCi F-18 FDG was injected intravenously. Full-ring PET imaging was performed from the skull base to thigh after the radiotracer. CT data was obtained and used for attenuation correction and anatomic localization. Fasting blood glucose: 114 mg/dl COMPARISON:  01/21/2019 chest CT. FINDINGS: Mediastinal blood pool activity: SUV max 2.3 NECK: No hypermetabolic lymph nodes in the neck. Asymmetric hypermetabolism within one of the muscles of mastication in the left infratemporal fossa, within the left styloglossus muscle and throughout the left tongue, nonspecific, probably activity related and/or inflammatory. No mass correlate on the CT images. Recommend correlation with directed clinical exam. Incidental CT findings: none CHEST: Hypermetabolic lobulated 1.6 cm solid medial right upper lobe pulmonary nodule with max SUV 6.8 (series 3/image 79), slightly increased from 1.5 cm on 01/21/2027 chest CT. No additional hypermetabolic pulmonary findings. Irregular 1.9 cm peripheral left upper lobe pulmonary nodule is non hypermetabolic with max SUV 1.6 (series 3/image 97), stable since  01/21/2027 chest CT. Solid 0.2 cm anterior left upper lobe pulmonary nodule (series 3/image 81) is below PET resolution and stable. No hypermetabolic axillary, mediastinal or hilar lymph nodes. Incidental CT findings: Left anterior descending coronary atherosclerosis. Atherosclerotic nonaneurysmal thoracic aorta. Stable dilated main pulmonary artery (3.5 cm diameter). ABDOMEN/PELVIS: No abnormal hypermetabolic activity within the liver, pancreas, adrenal glands, or spleen. No hypermetabolic lymph nodes in the abdomen or pelvis. Status post left hemicolectomy with intact appearing colorectal anastomosis. No abnormal hypermetabolism at the colorectal anastomosis. No unexpected foci of peritoneal hypermetabolism. Incidental CT findings: Simple 1.2 cm inferior right liver cyst. Cholecystectomy. Atherosclerotic nonaneurysmal abdominal aorta. Diverting loop ileostomy in the ventral right abdominal wall. Hysterectomy. SKELETON: No focal hypermetabolic activity to suggest skeletal metastasis. Incidental CT findings: none IMPRESSION: 1. Hypermetabolic 1.6 cm solid medial right upper lobe pulmonary nodule, compatible with pulmonary metastasis. If tissue sampling is clinically desired, recommend targeting this medial right upper lobe nodule. 2. No other sites of hypermetabolic metastatic disease. The 1.9 cm peripheral left upper lobe pulmonary nodule is non-hypermetabolic, more suggestive of a benign etiology. The 0.2 cm anterior left upper lobe pulmonary nodule is below PET resolution. Chest CT follow-up for these nodules recommended in 3-6 months. 3. No metabolic evidence of local tumor recurrence at the colorectal anastomosis. 4. Nonspecific asymmetric muscular hypermetabolism throughout the left tongue, within the left styloglossus muscle and within one of the muscles of mastication in the left infratemporal fossa, without mass correlate on the CT images, favor benign activity related or inflammatory uptake. 5.  Aortic  Atherosclerosis (ICD10-I70.0). Electronically Signed  By: Ilona Sorrel M.D.   On: 02/05/2019 15:45   Ct Biopsy  Result Date: 02/12/2019 INDICATION: History of colon cancer, PET positive medial right upper lobe lung nodule concerning for metastatic disease or lung primary. EXAM: CT-GUIDED BIOPSY POSTEROMEDIAL RIGHT UPPER LOBE NODULE MEDICATIONS: 1% LIDOCAINE LOCAL ANESTHESIA/SEDATION: 2.0 mg IV Versed; 75 mcg IV Fentanyl Moderate Sedation Time:  15 MINUTES The patient was continuously monitored during the procedure by the interventional radiology nurse under my direct supervision. PROCEDURE: The procedure, risks, benefits, and alternatives were explained to the patient. Questions regarding the procedure were encouraged and answered. The patient understands and consents to the procedure. Previous imaging reviewed. Patient position prone. Noncontrast localization CT performed. The posterior right upper lobe nodule was localized and correlated with the PET-CT. Overlying skin marked for a posterior approach. Under sterile conditions and local anesthesia, a 17 gauge 11.8 cm access needle was advanced from a posterior oblique approach to the nodule. Needle position confirmed with CT. 1 cm 18 gauge core biopsies obtained. These were placed in formalin. Samples were intact and non fragmented. Postprocedure imaging demonstrates a small amount of surrounding alveolar hemorrhage. Needle tract occluded with the bio sentry device. Postprocedure imaging demonstrates no effusion or pneumothorax. Patient tolerated the procedure well without complication. Vital sign monitoring by nursing staff during the procedure will continue as patient is in the special procedures unit for post procedure observation. FINDINGS: The images document guide needle placement within the posteromedial right upper lobe nodule. Post biopsy images demonstrate small amount of surrounding hemorrhage. No pneumothorax. COMPLICATIONS: None immediate.  IMPRESSION: Successful CT-guided core biopsy of the posteromedial right upper lobe nodule Electronically Signed   By: Jerilynn Mages.  Shick M.D.   On: 02/12/2019 11:52     Assessment and plan- Patient is a 73 y.o. female with the following issues:  1.  New diagnosis of small cell lung cancer: I explained to the patient the results of the PET scan as well as pathology which is consistent with small cell lung cancer is a solitary lung nodule 1.6 cm in the right upper lobe.  Discussed that treatment options at this time will be consideration for definitive surgery.  If at the time of surgery lymph nodes are found to be positive this would be followed by concurrent chemoradiation.  If lymph nodes were not positive at the time of surgery she would still need adjuvant chemotherapy.  A nonsurgical option at this time would be concurrent chemoradiation with radiation not only to the primary site of the tumor but also her lymph nodes given that she has small cell histology.  Regardless of by the patient decides to pursue surgery or radiation treatment-with small cell lung cancer chemotherapy would be warranted.  Discussed the aggressive nature of small cell lung cancer and the propensity to grow and metastasize quickly.  I would recommend 4 cycles of chemotherapy with cisplatin or carboplatin along with etoposide.  Platinum given on day 1 IV and etoposide given on day 1 day 2 and day 3 IV every 3 weeks for 4 cycles.  Repeat scans after 2 cycles.  Discussed risks and benefits of chemotherapy including all but not limited to nausea, vomiting, low blood counts, risk of infection and hospitalization.  Treatment will be given with a curative intent.  Patient would like to think about her options and get back to [place].  I will arrange for an MRI of her brain to complete her staging work-up and also a consult to see radiation oncology.  Depending on what option she chooses there would also be a role for prophylactic whole brain radiation  down the line.  2.  Patient reports feeling anxious going through this process of decision making.  I have given her a one-month supply of Ativan 0.5 mg every 8 hours as needed  3.  Colon cancer stage III: Patient opted not to pursue adjuvant chemotherapy in the past due to her performance status at that time along with poor social support.  CT scan so far has shown no evidence of metastatic disease.  Continue to monitor   Total face to face encounter time for this patient visit was 40 min. >50% of the time was  spent in counseling and coordination of care.    Cancer Staging Malignant neoplasm of colon [facility]) Staging form: Colon and Rectum, AJCC 8th Edition - Pathologic stage from 12/19/2018: Stage IIIB (pT3, pN1a, cM0) - Signed by Sindy Guadeloupe, MD on 12/22/2018  Small cell lung cancer [facility]) Staging form: Lung, AJCC 8th Edition - Clinical stage from 02/24/2019: cT1, cN0, cM0 - Signed by Sindy Guadeloupe, MD on 02/24/2019     Visit Diagnosis 1. Small cell lung cancer ([place])   2. Goals of care, counseling/discussion      Dr. Randa Evens, MD, MPH [facility] [phone] 02/24/2019 1:57 PM

## 2019-02-24 NOTE — Progress Notes (Signed)
Pt here for her pathology report and plan. She is out of her b/p med and her inhalers and has been out since she came here first time. I have encouraged her to call PCP and ask them to refill

## 2019-02-25 ENCOUNTER — Ambulatory Visit
Admission: RE | Admit: 2019-02-25 | Discharge: 2019-02-25 | Disposition: A | Discharge status: Home / Self Care | Payer: Medicare Other | Source: Ambulatory Visit | Attending: Surgery | Admitting: Surgery

## 2019-02-25 DIAGNOSIS — K6289 Other specified diseases of anus and rectum: Secondary | ICD-10-CM | POA: Insufficient documentation

## 2019-02-25 DIAGNOSIS — Z933 Colostomy status: Secondary | ICD-10-CM | POA: Diagnosis not present

## 2019-02-26 ENCOUNTER — Ambulatory Visit
Admission: RE | Admit: 2019-02-26 | Discharge: 2019-02-26 | Disposition: A | Discharge status: Home / Self Care | Payer: Medicare Other | Source: Ambulatory Visit | Attending: Oncology | Admitting: Oncology

## 2019-02-26 ENCOUNTER — Other Ambulatory Visit: Payer: Self-pay

## 2019-02-26 DIAGNOSIS — M450 Ankylosing spondylitis of multiple sites in spine: Secondary | ICD-10-CM | POA: Diagnosis not present

## 2019-02-26 DIAGNOSIS — Z9181 History of falling: Secondary | ICD-10-CM | POA: Diagnosis not present

## 2019-02-26 DIAGNOSIS — Z87440 Personal history of urinary (tract) infections: Secondary | ICD-10-CM | POA: Diagnosis not present

## 2019-02-26 DIAGNOSIS — M80071D Age-related osteoporosis with current pathological fracture, right ankle and foot, subsequent encounter for fracture with routine healing: Secondary | ICD-10-CM | POA: Diagnosis not present

## 2019-02-26 DIAGNOSIS — M7500 Adhesive capsulitis of unspecified shoulder: Secondary | ICD-10-CM | POA: Diagnosis not present

## 2019-02-26 DIAGNOSIS — Z85038 Personal history of other malignant neoplasm of large intestine: Secondary | ICD-10-CM | POA: Diagnosis not present

## 2019-02-26 DIAGNOSIS — G894 Chronic pain syndrome: Secondary | ICD-10-CM | POA: Diagnosis not present

## 2019-02-26 DIAGNOSIS — C3490 Malignant neoplasm of unspecified part of unspecified bronchus or lung: Secondary | ICD-10-CM | POA: Insufficient documentation

## 2019-02-26 DIAGNOSIS — Z933 Colostomy status: Secondary | ICD-10-CM | POA: Diagnosis not present

## 2019-02-26 DIAGNOSIS — M4802 Spinal stenosis, cervical region: Secondary | ICD-10-CM | POA: Diagnosis not present

## 2019-02-26 DIAGNOSIS — I6782 Cerebral ischemia: Secondary | ICD-10-CM | POA: Diagnosis not present

## 2019-02-26 DIAGNOSIS — Z7901 Long term (current) use of anticoagulants: Secondary | ICD-10-CM | POA: Diagnosis not present

## 2019-02-26 DIAGNOSIS — M48061 Spinal stenosis, lumbar region without neurogenic claudication: Secondary | ICD-10-CM | POA: Diagnosis not present

## 2019-02-26 DIAGNOSIS — J40 Bronchitis, not specified as acute or chronic: Secondary | ICD-10-CM | POA: Diagnosis not present

## 2019-02-26 DIAGNOSIS — M80062D Age-related osteoporosis with current pathological fracture, left lower leg, subsequent encounter for fracture with routine healing: Secondary | ICD-10-CM | POA: Diagnosis not present

## 2019-02-26 DIAGNOSIS — Z433 Encounter for attention to colostomy: Secondary | ICD-10-CM | POA: Diagnosis not present

## 2019-02-26 DIAGNOSIS — I10 Essential (primary) hypertension: Secondary | ICD-10-CM | POA: Diagnosis not present

## 2019-02-26 DIAGNOSIS — M4692 Unspecified inflammatory spondylopathy, cervical region: Secondary | ICD-10-CM | POA: Diagnosis not present

## 2019-02-26 DIAGNOSIS — R42 Dizziness and giddiness: Secondary | ICD-10-CM | POA: Diagnosis not present

## 2019-02-26 DIAGNOSIS — K219 Gastro-esophageal reflux disease without esophagitis: Secondary | ICD-10-CM | POA: Diagnosis not present

## 2019-02-26 LAB — POCT I-STAT CREATININE: Creatinine, Ser: 0.9 mg/dL (ref 0.44–1.00)

## 2019-02-26 MED ORDER — GADOBUTROL 1 MMOL/ML IV SOLN
6.0000 mL | Freq: Once | INTRAVENOUS | Status: AC | PRN
  Administered 2019-02-26: 15:00:00 6 mL via INTRAVENOUS

## 2019-02-27 ENCOUNTER — Ambulatory Visit
Admission: RE | Admit: 2019-02-27 | Discharge: 2019-02-27 | Disposition: A | Discharge status: Home / Self Care | Payer: Medicare Other | Source: Ambulatory Visit | Attending: Radiation Oncology | Admitting: Radiation Oncology

## 2019-02-27 ENCOUNTER — Other Ambulatory Visit: Payer: Self-pay | Admitting: Oncology

## 2019-02-27 ENCOUNTER — Other Ambulatory Visit: Payer: Self-pay

## 2019-02-27 ENCOUNTER — Encounter: Payer: Self-pay | Admitting: Radiation Oncology

## 2019-02-27 ENCOUNTER — Encounter: Payer: Self-pay | Admitting: *Deleted

## 2019-02-27 VITALS — BP 201/78 | HR 62 | Temp 98.8°F | Resp 16 | Wt 148.7 lb

## 2019-02-27 DIAGNOSIS — C3490 Malignant neoplasm of unspecified part of unspecified bronchus or lung: Secondary | ICD-10-CM

## 2019-02-27 DIAGNOSIS — M19011 Primary osteoarthritis, right shoulder: Secondary | ICD-10-CM | POA: Insufficient documentation

## 2019-02-27 DIAGNOSIS — F329 Major depressive disorder, single episode, unspecified: Secondary | ICD-10-CM | POA: Diagnosis not present

## 2019-02-27 DIAGNOSIS — M129 Arthropathy, unspecified: Secondary | ICD-10-CM | POA: Insufficient documentation

## 2019-02-27 DIAGNOSIS — M4810 Ankylosing hyperostosis [Forestier], site unspecified: Secondary | ICD-10-CM | POA: Insufficient documentation

## 2019-02-27 DIAGNOSIS — Z87891 Personal history of nicotine dependence: Secondary | ICD-10-CM | POA: Diagnosis not present

## 2019-02-27 DIAGNOSIS — C3411 Malignant neoplasm of upper lobe, right bronchus or lung: Secondary | ICD-10-CM | POA: Insufficient documentation

## 2019-02-27 DIAGNOSIS — K219 Gastro-esophageal reflux disease without esophagitis: Secondary | ICD-10-CM | POA: Diagnosis not present

## 2019-02-27 DIAGNOSIS — J45909 Unspecified asthma, uncomplicated: Secondary | ICD-10-CM | POA: Diagnosis not present

## 2019-02-27 DIAGNOSIS — Z85038 Personal history of other malignant neoplasm of large intestine: Secondary | ICD-10-CM | POA: Insufficient documentation

## 2019-02-27 DIAGNOSIS — Z79899 Other long term (current) drug therapy: Secondary | ICD-10-CM | POA: Diagnosis not present

## 2019-02-27 DIAGNOSIS — M5136 Other intervertebral disc degeneration, lumbar region: Secondary | ICD-10-CM | POA: Insufficient documentation

## 2019-02-27 DIAGNOSIS — M4802 Spinal stenosis, cervical region: Secondary | ICD-10-CM | POA: Diagnosis not present

## 2019-02-27 DIAGNOSIS — Z803 Family history of malignant neoplasm of breast: Secondary | ICD-10-CM | POA: Insufficient documentation

## 2019-02-27 MED ORDER — PROCHLORPERAZINE MALEATE 10 MG PO TABS: 10.0000 mg | ORAL_TABLET | Freq: Four times a day (QID) | ORAL | 1 refills | Status: DC | PRN

## 2019-02-27 MED ORDER — LIDOCAINE-PRILOCAINE 2.5-2.5 % EX CREA: TOPICAL_CREAM | CUTANEOUS | 3 refills | Status: DC

## 2019-02-27 MED ORDER — ONDANSETRON HCL 8 MG PO TABS: 8.0000 mg | ORAL_TABLET | Freq: Two times a day (BID) | ORAL | 1 refills | Status: DC | PRN

## 2019-02-27 NOTE — Progress Notes (Signed)
  Oncology Nurse Navigator Documentation  Navigator Location: [facility] (02/27/19 1100)   )Navigator Encounter Type: Initial RadOnc (02/27/19 1100)                         Barriers/Navigation Needs: Coordination of Care (02/27/19 1100)   Interventions: Coordination of Care (02/27/19 1100)   Coordination of Care: Appts;Chemo (02/27/19 1100)             met with patient during initial rad-onc consultation with Dr. Baruch Gouty today. After discussing radiation, pt is agreeable to proceed with concurrent chemo and radiation for her small cell lung cancer. Informed pt that she will need a port placed by a surgeon to receive chemotherapy. Pt states has an appt with Dr. Dahlia Byes on Monday 6/1 at 3:30pm. Informed pt that we will contact Dr. Dahlia Byes to see if he could discuss port placement with her at her next visit. Also, pt informed that will need chemotherapy education which has been scheduled on Tues 6/2 at 9am. Pt informed that I will follow up with her next week to let her know when she is scheduled to start chemo with Dr. Janese Banks. Since pt was anxious during visit today, I informed her that we will only focus on a few appts at one time. Pt is aware that she will start chemo within the next 1-2 weeks. Pt instructed to call with any further questions or needs. Pt verbalized understanding. All questions answered during visit.      Time Spent with Patient: 60 (02/27/19 1100)

## 2019-02-27 NOTE — Consult Note (Signed)
NEW PATIENT EVALUATION  Name: Gloria Allen  MRN: [id number]  Date:   02/27/2019     DOB: 1946-07-21   This 73 y.o. female patient presents to the clinic for initial evaluation of stage I (T1 N0 M0) small cell lung cancer of the right lung limited stage disease.  REFERRING PHYSICIAN: [facility], [place]:  Chief Complaint  Patient presents with  . Lung Cancer    Initial consultation of lung     DIAGNOSIS: The encounter diagnosis was Small cell lung cancer ([place]).   PREVIOUS INVESTIGATIONS:  PET/CT and CT scans reviewed Pathology report reviewed Clinical notes reviewed  HPI: Patient is a 73 year old female who presented at the time of colonoscopy with a non-obstructing sigmoid mass 12 cm from the anus.  Biopsy was positive for invasive adenocarcinoma.  She underwent hemicolectomy with lymph node sampling in February 2020.  Pathology showed 3.5 cm tumor overall grade 2 with tumor invading through the muscularis propria into the perirectal tissue.  Margins were negative.  1 out of 9 lymph nodes examined was positive for malignancy.  Patient decided and refused adjuvant treatment at that time.  At the time of staging work-up a CT scan of the lung showed 2 nodules one in the right upper lobe and one in the left upper lobe.  PET CT scan confirmed hypermetabolic activity in the lung right upper lobe lesion with an SUV of 6.8.  Left upper lobe lesion was non-hypermetabolic.  No mediastinal or hilar nodes were hypermetabolic on PET/CT study.  CT-guided biopsy of the right upper lobe nodule was consistent with small cell lung, carcinoma positive for CD56 and TTF-1-1.  She has been seen by medical oncology and is now referred to radiation oncology for consideration of treatment.  MRI of the brain was negative for any evidence of metastatic disease.  She is seen today and quite nervous about her diagnosis.  She is asymptomatic from a pulmonary standpoint she specifically denies cough  hemoptysis chest tightness or dysphasia.  PLANNED TREATMENT REGIMEN: Concurrent chemoradiation  PAST MEDICAL HISTORY:  has a past medical history of Anxiety, Arthritis, Asthma, Cancer ([place]), Cervical central spinal stenosis (C3-C7) (worse at C4-5) (07/30/2017), Cervical foraminal stenosis (C4-5 and C5-6) (Bilateral) (L>R) (07/30/2017), Chronic lower extremity pain (Fourth Area of Pain) (Bilateral) (R>L) (06/11/2017), Chronic neck pain (Primary Area of Pain) (Bilateral) (R>L) (06/11/2017), Chronic sacroiliac joint pain (Bilateral) (R>L) (06/11/2017), Chronic shoulder pain (Tertiary Area of Pain) (Right) (06/11/2017), Chronic upper extremity pain (Fifth Area of Pain) (Bilateral) (R>L) (06/11/2017), Colon cancer ([place]), DDD (degenerative disc disease), cervical (07/30/2017), DDD (degenerative disc disease), lumbar (07/30/2017), Depression, DISH (diffuse idiopathic skeletal hyperostosis) (07/30/2017), Dyspnea, Entrapment syndrome (06/20/2017), Full thickness rotator cuff tear (06/12/2017), GERD (gastroesophageal reflux disease), Grade 1 Anterolisthesis of L4 over L5 (07/30/2017), Headache, UTI (urinary tract infection), Hypertension, Inflammation of joint of shoulder region (01/15/2017), Lumbar central spinal stenosis (L4-5) (07/30/2017), Lumbar disc protrusion (Left: L5-S1) (Right: L1-2) (07/30/2017), Lumbar facet arthropathy (Bilateral) (07/30/2017), Lumbar facet syndrome (Bilateral) (R>L) (07/30/2017), Lung cancer ([place]), and Osteoarthritis of shoulder (Right) (01/15/2017).    PAST SURGICAL HISTORY:  Past Surgical History:  Procedure Laterality Date  . ABDOMINAL HYSTERECTOMY    . APPENDECTOMY    . BREAST REDUCTION SURGERY Bilateral   . COLONOSCOPY WITH PROPOFOL N/A 11/05/2018   Procedure: COLONOSCOPY WITH PROPOFOL;  Surgeon: Virgel Manifold, MD;  Location: [facility] ENDOSCOPY;  Service: Endoscopy;  Laterality: N/A;  . COLONOSCOPY WITH PROPOFOL N/A 11/06/2018   Procedure: COLONOSCOPY WITH PROPOFOL;  Surgeon:  Virgel Manifold, MD;  Location: [facility] ENDOSCOPY;  Service: Endoscopy;  Laterality: N/A;  . COLOSTOMY    . DILATION AND CURETTAGE OF UTERUS    . ESOPHAGOGASTRODUODENOSCOPY (EGD) WITH PROPOFOL N/A 11/05/2018   Procedure: ESOPHAGOGASTRODUODENOSCOPY (EGD) WITH PROPOFOL;  Surgeon: Virgel Manifold, MD;  Location: [facility] ENDOSCOPY;  Service: Endoscopy;  Laterality: N/A;  . ILEOSTOMY Right 11/27/2018   Procedure: ILEOSTOMY;  Surgeon: Jules Husbands, MD;  Location: [facility] ORS;  Service: General;  Laterality: Right;  . LAPAROSCOPIC SIGMOID COLECTOMY N/A 11/27/2018   Procedure: LAPAROSCOPIC SIGMOID COLECTOMY;  Surgeon: Jules Husbands, MD;  Location: [facility] ORS;  Service: General;  Laterality: N/A;  . ORIF ANKLE FRACTURE Right 11/21/2018   Procedure: OPEN REDUCTION INTERNAL FIXATION (ORIF) ANKLE FRACTURE;  Surgeon: Corky Mull, MD;  Location: [facility] ORS;  Service: Orthopedics;  Laterality: Right;  . REDUCTION MAMMAPLASTY    . SHOULDER ARTHROSCOPY WITH OPEN ROTATOR CUFF REPAIR AND DISTAL CLAVICLE ACROMINECTOMY Right 03/26/2017   Procedure: right shoulder arthroscopy, arthroscopic subacromial decompression, distal clavicle excision, mini open rotator cuff repair;  Surgeon: Thornton Park, MD;  Location: [facility] ORS;  Service: Orthopedics;  Laterality: Right;    FAMILY HISTORY: family history includes Breast cancer (age of onset: 40) in her mother; Breast cancer (age of onset: 53) in her sister; Hypertension in her mother.  SOCIAL HISTORY:  reports that she quit smoking about 10 years ago. Her smoking use included cigarettes. She smoked 1.00 pack per day. She has never used smokeless tobacco. She reports that she does not drink alcohol or use drugs.  ALLERGIES: Ace inhibitors; Angiotensin receptor blockers; Sulfa antibiotics; Chlorthalidone; and Flexeril [cyclobenzaprine]  MEDICATIONS:  Current Outpatient Medications  Medication Sig Dispense Refill  . BREO ELLIPTA 200-25 MCG/INH AEPB Inhale 1 puff into the  lungs daily.     . clonazePAM (KLONOPIN) 0.5 MG tablet Take 0.5 mg by mouth 3 (three) times daily.     . cloNIDine (CATAPRES) 0.3 MG tablet Take 2 tablets (0.6 mg total) by mouth 2 (two) times daily. (Patient taking differently: Take 0.3 mg by mouth 2 (two) times daily. ) 120 tablet 2  . cyclobenzaprine (FLEXERIL) 5 MG tablet Take 5 mg by mouth at bedtime.    . hydrALAZINE (APRESOLINE) 100 MG tablet Take 1 tablet (100 mg total) by mouth 3 (three) times daily. (Patient taking differently: Take 100 mg by mouth daily. ) 90 tablet 1  . hydrOXYzine (ATARAX/VISTARIL) 10 MG tablet TAKE 1 TABLET BY MOUTH THREE TIMES A DAY AS NEEDED (Patient taking differently: Take 10 mg by mouth 3 (three) times daily as needed for itching. ) 90 tablet 0  . loperamide (IMODIUM) 2 MG capsule Take 1 capsule (2 mg total) by mouth 3 (three) times daily. 60 capsule 1  . LORazepam (ATIVAN) 0.5 MG tablet Take 1 tablet (0.5 mg total) by mouth every 8 (eight) hours as needed for anxiety. 90 tablet 0  . lovastatin (MEVACOR) 40 MG tablet Take 1 tablet (40 mg total) by mouth at bedtime. 90 tablet 1  . meclizine (ANTIVERT) 12.5 MG tablet Take 12.5 mg by mouth 3 (three) times daily as needed for dizziness.   0  . megestrol (MEGACE) 400 MG/10ML suspension Take 10 mLs (400 mg total) by mouth daily for 30 doses. 300 mL 0  . methocarbamol (ROBAXIN) 500 MG tablet Take 1 tablet (500 mg total) by mouth at bedtime as needed for muscle spasms. 30 tablet 0  . metoprolol succinate (TOPROL-XL) 100 MG 24  hr tablet TAKE 1 TABLET BY MOUTH DAILY 30 tablet 0  . mirtazapine (REMERON) 15 MG tablet take 1 tablet by mouth at bedtime for APPETITIE (Patient taking differently: Take 15 mg by mouth at bedtime. take 1 tablet by mouth at bedtime for APPETITIE) 180 tablet 0  . oxybutynin (DITROPAN) 5 MG tablet TAKE 1 TABLET BY MOUTH TWICE DAILY (Patient taking differently: Take 5 mg by mouth 2 (two) times daily. ) 180 tablet 0  . oxyCODONE-acetaminophen  (PERCOCET/ROXICET) 5-325 MG tablet Take 1 tablet by mouth every 6 (six) hours as needed for severe pain. 20 tablet 0  . Potassium 99 MG TABS Take 1 tablet by mouth daily as needed (cramps).     . sertraline (ZOLOFT) 100 MG tablet Take 2 tablets (200 mg total) by mouth at bedtime. 60 tablet 3  . terazosin (HYTRIN) 5 MG capsule TAKE 1 CAPSULE BY MOUTH ONCE DAILY AT BEDTIME 30 capsule 0  . triamcinolone cream (KENALOG) 0.1 % APPLY TOPICALLY TWICE DAILY AS NEEDED FOR ITCHING (Patient taking differently: Apply 1 application topically 2 (two) times daily as needed (itching). ) 30 g 0  . albuterol (PROVENTIL HFA;VENTOLIN HFA) 108 (90 Base) MCG/ACT inhaler Inhale 2 puffs into the lungs every 6 (six) hours as needed for wheezing or shortness of breath. (Patient not taking: Reported on 02/24/2019) 1 Inhaler 3  . ANORO ELLIPTA 62.5-25 MCG/INH AEPB INHALE 1 PUFF BY MOUTH DAILY (Patient not taking: Reported on 02/24/2019) 60 each 0  . montelukast (SINGULAIR) 10 MG tablet Take 1 tablet (10 mg total) by mouth at bedtime as needed. (Patient not taking: Reported on 02/24/2019) 90 tablet 1  . omeprazole (PRILOSEC) 40 MG capsule Take 1 capsule (40 mg total) by mouth 2 (two) times daily. (Patient not taking: Reported on 02/24/2019) 60 capsule 2  . traZODone (DESYREL) 50 MG tablet Take 0.5-1 tablets (25-50 mg total) by mouth at bedtime as needed for sleep. (Patient not taking: Reported on 02/24/2019) 30 tablet 3   No current facility-administered medications for this encounter.     ECOG PERFORMANCE STATUS:  0 - Asymptomatic  REVIEW OF SYSTEMS: Patient denies any weight loss, fatigue, weakness, fever, chills or night sweats. Patient denies any loss of vision, blurred vision. Patient denies any ringing  of the ears or hearing loss. No irregular heartbeat. Patient denies heart murmur or history of fainting. Patient denies any chest pain or pain radiating to her upper extremities. Patient denies any shortness of breath,  difficulty breathing at night, cough or hemoptysis. Patient denies any swelling in the lower legs. Patient denies any nausea vomiting, vomiting of blood, or coffee ground material in the vomitus. Patient denies any stomach pain. Patient states has had normal bowel movements no significant constipation or diarrhea. Patient denies any dysuria, hematuria or significant nocturia. Patient denies any problems walking, swelling in the joints or loss of balance. Patient denies any skin changes, loss of hair or loss of weight. Patient denies any excessive worrying or anxiety or significant depression. Patient denies any problems with insomnia. Patient denies excessive thirst, polyuria, polydipsia. Patient denies any swollen glands, patient denies easy bruising or easy bleeding. Patient denies any recent infections, allergies or URI. Patient "s visual fields have not changed significantly in recent time.   PHYSICAL EXAM: BP (!) 201/78 (BP Location: Left Arm, Patient Position: Sitting)   Pulse 62   Temp 98.8 F (37.1 C) (Tympanic)   Resp 16   Wt 148 lb 11.2 oz (67.4 kg)   BMI  24.74 kg/m  Well-developed well-nourished patient in NAD. HEENT reveals PERLA, EOMI, discs not visualized.  Oral cavity is clear. No oral mucosal lesions are identified. Neck is clear without evidence of cervical or supraclavicular adenopathy. Lungs are clear to A&P. Cardiac examination is essentially unremarkable with regular rate and rhythm without murmur rub or thrill. Abdomen is benign with no organomegaly or masses noted. Motor sensory and DTR levels are equal and symmetric in the upper and lower extremities. Cranial nerves II through XII are grossly intact. Proprioception is intact. No peripheral adenopathy or edema is identified. No motor or sensory levels are noted. Crude visual fields are within normal range.  LABORATORY DATA: Pathology report reviewed    RADIOLOGY RESULTS: CT scans and PET CT scans as well as MRI of brain  reviewed and compatible with above-stated findings   IMPRESSION: Limited stage small cell lung cancer of the right upper lobe in a 73 year old female  PLAN: At this time even though we are dealing with extremely small limited stage small cell lung cancer would advocate for concurrent chemoradiation therapy with curative intent.  I would plan on delivering 6000 cGy to her hypermetabolic right upper lobe nodule as well as the perihilar lymph nodes.  Risks and benefits of treatment including possible dysphasia development of cough fatigue alteration of blood counts and skin reaction all were discussed with the patient.  We will coordinate chemotherapy with medical oncology.  There will be extra effort by both professional staff as well as technical staff to coordinate and manage concurrent chemoradiation and ensuing side effects during her treatments.  After completion of all chemotherapy will reevaluate the patient and offer PCI should she have a complete response.  Patient comprehends my treatment plan well.  Nurse navigator was present throughout discussion.  I have personally set up and ordered CT simulation for early next week.  Patient anticipates having a port placed.  Case was discussed personally with medical oncology.  I would like to take this opportunity to thank you for allowing me to participate in the care of your patient.Noreene Filbert, MD

## 2019-02-27 NOTE — Progress Notes (Signed)
START ON PATHWAY REGIMEN - Small Cell Lung     A cycle is every 21 days:     Etoposide      Carboplatin   **Always confirm dose/schedule in your pharmacy ordering system**  Patient Characteristics: Newly Diagnosed, Preoperative or Nonsurgical Candidate (Clinical Staging), First Line, Limited Stage, Nonsurgical Candidate Therapeutic Status: Newly Diagnosed, Preoperative or Nonsurgical Candidate (Clinical Staging) AJCC T Category: cT1c AJCC N Category: cN0 AJCC M Category: cM0 AJCC 8 Stage Grouping: IA3 Stage Classification: Limited Surgical Candidacy: Nonsurgical Candidate Intent of Therapy: Curative Intent, Discussed with Patient

## 2019-02-28 DIAGNOSIS — M48061 Spinal stenosis, lumbar region without neurogenic claudication: Secondary | ICD-10-CM | POA: Diagnosis not present

## 2019-02-28 DIAGNOSIS — I10 Essential (primary) hypertension: Secondary | ICD-10-CM | POA: Diagnosis not present

## 2019-02-28 DIAGNOSIS — M7500 Adhesive capsulitis of unspecified shoulder: Secondary | ICD-10-CM | POA: Diagnosis not present

## 2019-02-28 DIAGNOSIS — Z87440 Personal history of urinary (tract) infections: Secondary | ICD-10-CM | POA: Diagnosis not present

## 2019-02-28 DIAGNOSIS — M450 Ankylosing spondylitis of multiple sites in spine: Secondary | ICD-10-CM | POA: Diagnosis not present

## 2019-02-28 DIAGNOSIS — G894 Chronic pain syndrome: Secondary | ICD-10-CM | POA: Diagnosis not present

## 2019-02-28 DIAGNOSIS — M4802 Spinal stenosis, cervical region: Secondary | ICD-10-CM | POA: Diagnosis not present

## 2019-02-28 DIAGNOSIS — Z9181 History of falling: Secondary | ICD-10-CM | POA: Diagnosis not present

## 2019-02-28 DIAGNOSIS — M80071D Age-related osteoporosis with current pathological fracture, right ankle and foot, subsequent encounter for fracture with routine healing: Secondary | ICD-10-CM | POA: Diagnosis not present

## 2019-02-28 DIAGNOSIS — Z7901 Long term (current) use of anticoagulants: Secondary | ICD-10-CM | POA: Diagnosis not present

## 2019-02-28 DIAGNOSIS — M80062D Age-related osteoporosis with current pathological fracture, left lower leg, subsequent encounter for fracture with routine healing: Secondary | ICD-10-CM | POA: Diagnosis not present

## 2019-02-28 DIAGNOSIS — K219 Gastro-esophageal reflux disease without esophagitis: Secondary | ICD-10-CM | POA: Diagnosis not present

## 2019-02-28 DIAGNOSIS — Z433 Encounter for attention to colostomy: Secondary | ICD-10-CM | POA: Diagnosis not present

## 2019-02-28 DIAGNOSIS — M4692 Unspecified inflammatory spondylopathy, cervical region: Secondary | ICD-10-CM | POA: Diagnosis not present

## 2019-02-28 DIAGNOSIS — J40 Bronchitis, not specified as acute or chronic: Secondary | ICD-10-CM | POA: Diagnosis not present

## 2019-02-28 DIAGNOSIS — Z85038 Personal history of other malignant neoplasm of large intestine: Secondary | ICD-10-CM | POA: Diagnosis not present

## 2019-03-02 ENCOUNTER — Encounter: Payer: Self-pay | Admitting: Surgery

## 2019-03-02 ENCOUNTER — Ambulatory Visit
Admission: RE | Admit: 2019-03-02 | Discharge: 2019-03-02 | Disposition: A | Discharge status: Home / Self Care | Payer: Medicare Other | Source: Ambulatory Visit | Attending: Radiation Oncology | Admitting: Radiation Oncology

## 2019-03-02 ENCOUNTER — Other Ambulatory Visit: Payer: Self-pay

## 2019-03-02 ENCOUNTER — Ambulatory Visit (INDEPENDENT_AMBULATORY_CARE_PROVIDER_SITE_OTHER): Payer: Medicare Other | Admitting: Surgery

## 2019-03-02 VITALS — BP 195/83 | HR 73 | Temp 97.9°F | Resp 16 | Ht 65.0 in | Wt 152.4 lb

## 2019-03-02 DIAGNOSIS — Z510 Encounter for antineoplastic radiation therapy: Secondary | ICD-10-CM | POA: Diagnosis not present

## 2019-03-02 DIAGNOSIS — M4802 Spinal stenosis, cervical region: Secondary | ICD-10-CM | POA: Diagnosis not present

## 2019-03-02 DIAGNOSIS — Z7901 Long term (current) use of anticoagulants: Secondary | ICD-10-CM | POA: Diagnosis not present

## 2019-03-02 DIAGNOSIS — C187 Malignant neoplasm of sigmoid colon: Secondary | ICD-10-CM | POA: Diagnosis not present

## 2019-03-02 DIAGNOSIS — C3411 Malignant neoplasm of upper lobe, right bronchus or lung: Secondary | ICD-10-CM | POA: Insufficient documentation

## 2019-03-02 DIAGNOSIS — Z87891 Personal history of nicotine dependence: Secondary | ICD-10-CM | POA: Insufficient documentation

## 2019-03-02 DIAGNOSIS — Z87440 Personal history of urinary (tract) infections: Secondary | ICD-10-CM | POA: Diagnosis not present

## 2019-03-02 DIAGNOSIS — M4692 Unspecified inflammatory spondylopathy, cervical region: Secondary | ICD-10-CM | POA: Diagnosis not present

## 2019-03-02 DIAGNOSIS — M450 Ankylosing spondylitis of multiple sites in spine: Secondary | ICD-10-CM | POA: Diagnosis not present

## 2019-03-02 DIAGNOSIS — Z9181 History of falling: Secondary | ICD-10-CM | POA: Diagnosis not present

## 2019-03-02 DIAGNOSIS — I10 Essential (primary) hypertension: Secondary | ICD-10-CM | POA: Diagnosis not present

## 2019-03-02 DIAGNOSIS — Z85038 Personal history of other malignant neoplasm of large intestine: Secondary | ICD-10-CM | POA: Diagnosis not present

## 2019-03-02 DIAGNOSIS — M7500 Adhesive capsulitis of unspecified shoulder: Secondary | ICD-10-CM | POA: Diagnosis not present

## 2019-03-02 DIAGNOSIS — M48061 Spinal stenosis, lumbar region without neurogenic claudication: Secondary | ICD-10-CM | POA: Diagnosis not present

## 2019-03-02 DIAGNOSIS — G894 Chronic pain syndrome: Secondary | ICD-10-CM | POA: Diagnosis not present

## 2019-03-02 DIAGNOSIS — Z433 Encounter for attention to colostomy: Secondary | ICD-10-CM | POA: Diagnosis not present

## 2019-03-02 DIAGNOSIS — K219 Gastro-esophageal reflux disease without esophagitis: Secondary | ICD-10-CM | POA: Diagnosis not present

## 2019-03-02 DIAGNOSIS — J40 Bronchitis, not specified as acute or chronic: Secondary | ICD-10-CM | POA: Diagnosis not present

## 2019-03-02 DIAGNOSIS — M80062D Age-related osteoporosis with current pathological fracture, left lower leg, subsequent encounter for fracture with routine healing: Secondary | ICD-10-CM | POA: Diagnosis not present

## 2019-03-02 DIAGNOSIS — M80071D Age-related osteoporosis with current pathological fracture, right ankle and foot, subsequent encounter for fracture with routine healing: Secondary | ICD-10-CM | POA: Diagnosis not present

## 2019-03-02 NOTE — Progress Notes (Signed)
Outpatient Surgical Follow Up  03/02/2019  Gloria Allen is an 73 y.o. female.   Chief Complaint  Patient presents with  . Routine Post Op    Lap sigmoid ostomy    HPI: Patient with recently diagnosed lung cancer in need for chemotherapy and port placement.  She recently had a barium enema that shows no evidence of an anastomotic leak unfortunately I am not sure why the contrast did not reach all the way to the descending colon and splenic flexure.  She will eventually will need flex sig with anoscopy to assess that area.  Her ileostomy is working very well.  No fevers no chills.  Past Medical History:  Diagnosis Date  . Anxiety   . Arthritis   . Asthma   . Cancer ([place])   . Cervical central spinal stenosis (C3-C7) (worse at C4-5) 07/30/2017  . Cervical foraminal stenosis (C4-5 and C5-6) (Bilateral) (L>R) 07/30/2017  . Chronic lower extremity pain (Fourth Area of Pain) (Bilateral) (R>L) 06/11/2017  . Chronic neck pain (Primary Area of Pain) (Bilateral) (R>L) 06/11/2017  . Chronic sacroiliac joint pain (Bilateral) (R>L) 06/11/2017  . Chronic shoulder pain [facility] Area of Pain) (Right) 06/11/2017  . Chronic upper extremity pain (Fifth Area of Pain) (Bilateral) (R>L) 06/11/2017  . Colon cancer ([place])   . DDD (degenerative disc disease), cervical 07/30/2017  . DDD (degenerative disc disease), lumbar 07/30/2017  . Depression   . DISH (diffuse idiopathic skeletal hyperostosis) 07/30/2017  . Dyspnea    with exertion  . Entrapment syndrome 06/20/2017   2002 on R and 2010 on L  . Full thickness rotator cuff tear 06/12/2017  . GERD (gastroesophageal reflux disease)   . Grade 1 Anterolisthesis of L4 over L5 07/30/2017  . Headache   . Hx: UTI (urinary tract infection)   . Hypertension   . Inflammation of joint of shoulder region 01/15/2017  . Lumbar central spinal stenosis (L4-5) 07/30/2017  . Lumbar disc protrusion (Left: L5-S1) (Right: L1-2) 07/30/2017   L5-S1 left foraminal protrusion with  L5 impingement. L1-2 right paracentral protrusion without impingement.  . Lumbar facet arthropathy (Bilateral) 07/30/2017  . Lumbar facet syndrome (Bilateral) (R>L) 07/30/2017  . Lung cancer ([place])   . Osteoarthritis of shoulder (Right) 01/15/2017    Past Surgical History:  Procedure Laterality Date  . ABDOMINAL HYSTERECTOMY    . APPENDECTOMY    . BREAST REDUCTION SURGERY Bilateral   . COLONOSCOPY WITH PROPOFOL N/A 11/05/2018   Procedure: COLONOSCOPY WITH PROPOFOL;  Surgeon: Virgel Manifold, MD;  Location: [facility] ENDOSCOPY;  Service: Endoscopy;  Laterality: N/A;  . COLONOSCOPY WITH PROPOFOL N/A 11/06/2018   Procedure: COLONOSCOPY WITH PROPOFOL;  Surgeon: Virgel Manifold, MD;  Location: [facility] ENDOSCOPY;  Service: Endoscopy;  Laterality: N/A;  . COLOSTOMY    . DILATION AND CURETTAGE OF UTERUS    . ESOPHAGOGASTRODUODENOSCOPY (EGD) WITH PROPOFOL N/A 11/05/2018   Procedure: ESOPHAGOGASTRODUODENOSCOPY (EGD) WITH PROPOFOL;  Surgeon: Virgel Manifold, MD;  Location: [facility] ENDOSCOPY;  Service: Endoscopy;  Laterality: N/A;  . ILEOSTOMY Right 11/27/2018   Procedure: ILEOSTOMY;  Surgeon: Jules Husbands, MD;  Location: [facility] ORS;  Service: General;  Laterality: Right;  . LAPAROSCOPIC SIGMOID COLECTOMY N/A 11/27/2018   Procedure: LAPAROSCOPIC SIGMOID COLECTOMY;  Surgeon: Jules Husbands, MD;  Location: [facility] ORS;  Service: General;  Laterality: N/A;  . ORIF ANKLE FRACTURE Right 11/21/2018   Procedure: OPEN REDUCTION INTERNAL FIXATION (ORIF) ANKLE FRACTURE;  Surgeon: Corky Mull, MD;  Location: [facility] ORS;  Service: Orthopedics;  Laterality: Right;  . REDUCTION MAMMAPLASTY    . SHOULDER ARTHROSCOPY WITH OPEN ROTATOR CUFF REPAIR AND DISTAL CLAVICLE ACROMINECTOMY Right 03/26/2017   Procedure: right shoulder arthroscopy, arthroscopic subacromial decompression, distal clavicle excision, mini open rotator cuff repair;  Surgeon: Thornton Park, MD;  Location: [facility] ORS;  Service: Orthopedics;  Laterality: Right;     Family History  Problem Relation Age of Onset  . Hypertension Mother   . Breast cancer Mother 73  . Breast cancer Sister 80    Social History:  reports that she quit smoking about 10 years ago. Her smoking use included cigarettes. She smoked 1.00 pack per day. She has never used smokeless tobacco. She reports that she does not drink alcohol or use drugs.  Allergies:  Allergies  Allergen Reactions  . Ace Inhibitors Swelling  . Angiotensin Receptor Blockers Swelling  . Sulfa Antibiotics Itching  . Chlorthalidone Rash  . Flexeril [Cyclobenzaprine] Rash    Medications reviewed.    ROS Full ROS performed and is otherwise negative other than what is stated in HPI   BP (!) 195/83   Pulse 73   Temp 97.9 F (36.6 C) (Temporal)   Resp 16   Ht 5\' 5"  (1.651 m)   Wt 152 lb 6.4 oz (69.1 kg)   SpO2 99%   BMI 25.36 kg/m   Physical Exam Vitals signs and nursing note reviewed.  Constitutional:      Appearance: Normal appearance.  Cardiovascular:     Rate and Rhythm: Normal rate and regular rhythm.  Pulmonary:     Effort: Pulmonary effort is normal. No respiratory distress.     Breath sounds: No wheezing.  Abdominal:     General: Abdomen is flat. There is no distension.     Tenderness: There is no abdominal tenderness. There is no guarding.     Comments: Ileostomy patent, no peritonitis  Skin:    General: Skin is warm and dry.     Capillary Refill: Capillary refill takes less than 2 seconds.     Coloration: Skin is not jaundiced.  Neurological:     General: No focal deficit present.     Mental Status: She is alert and oriented to person, place, and time.  Psychiatric:        Mood and Affect: Mood normal.        Behavior: Behavior normal.        Thought Content: Thought content normal.        Judgment: Judgment normal.        Assessment/Plan:  SCC Right upper lobe in need for chemo and port placement.  Cussed with the patient in detail about the procedure.   Risk benefit and possible complications including but not limited to: Bleeding, infection, vascular injury and pneumothorax.  She understands and wishes to proceed.  From ileostomy standpoint I will like to hold off until she finishes her radiation and chemotherapy and she has recovered before elective abdominal surgical intervention is attempted  Caroleen Hamman, MD [place] Surgeon

## 2019-03-02 NOTE — Patient Instructions (Signed)
Patient's surgery to be scheduled for 03/12/19 at [facility] with Dr. Dahlia Byes.    The patient is aware to have COVID-19 testing done on 03/09/19 at the [facility] building drive thru ([address] [place]) between 10:30 am and 12:30 pm. she is aware to isolate after, have no visitors, wash hands frequently, and avoid touching face.   The patient is aware she will need to Pre-Admit at the hospital.  Patient will check in at the [place] entrance due to COVID-19 restrictions and will then be escorted to the [facility], [address] (first floor). Patient will be contacted once Pre-admission appointment has been arranged with date and time.   Patient aware to be NPO after midnight and have a driver.   She is aware to check in at the [place] entrance where he/she will be screened for the coronavirus and then sent to [facility].   Patient aware that she may have no visitors and driver will need to wait in the car due to COVID-19 restrictions.   The patient verbalizes understanding of the above.   The patient is aware to call the office should he/she have further questions.

## 2019-03-02 NOTE — Patient Instructions (Signed)
Etoposide, VP-16 injection What is this medicine? ETOPOSIDE, VP-16 (e toe POE side) is a chemotherapy drug. It is used to treat testicular cancer, lung cancer, and other cancers. This medicine may be used for other purposes; ask your health care provider or pharmacist if you have questions. COMMON BRAND NAME(S): Etopophos, Toposar, VePesid What should I tell my health care provider before I take this medicine? They need to know if you have any of these conditions: -infection -kidney disease -liver disease -low blood counts, like low white cell, platelet, or red cell counts -an unusual or allergic reaction to etoposide, other medicines, foods, dyes, or preservatives -pregnant or trying to get pregnant -breast-feeding How should I use this medicine? This medicine is for infusion into a vein. It is administered in a hospital or clinic by a specially trained health care professional. Talk to your pediatrician regarding the use of this medicine in children. Special care may be needed. Overdosage: If you think you have taken too much of this medicine contact a poison control center or emergency room at once. NOTE: This medicine is only for you. Do not share this medicine with others. What if I miss a dose? It is important not to miss your dose. Call your doctor or health care professional if you are unable to keep an appointment. What may interact with this medicine? -aspirin -certain medications for seizures like carbamazepine, phenobarbital, phenytoin, valproic acid -cyclosporine -levamisole -warfarin This list may not describe all possible interactions. Give your health care provider a list of all the medicines, herbs, non-prescription drugs, or dietary supplements you use. Also tell them if you smoke, drink alcohol, or use illegal drugs. Some items may interact with your medicine. What should I watch for while using this medicine? Visit your doctor for checks on your progress. This drug  may make you feel generally unwell. This is not uncommon, as chemotherapy can affect healthy cells as well as cancer cells. Report any side effects. Continue your course of treatment even though you feel ill unless your doctor tells you to stop. In some cases, you may be given additional medicines to help with side effects. Follow all directions for their use. Call your doctor or health care professional for advice if you get a fever, chills or sore throat, or other symptoms of a cold or flu. Do not treat yourself. This drug decreases your body's ability to fight infections. Try to avoid being around people who are sick. This medicine may increase your risk to bruise or bleed. Call your doctor or health care professional if you notice any unusual bleeding. Talk to your doctor about your risk of cancer. You may be more at risk for certain types of cancers if you take this medicine. Do not become pregnant while taking this medicine or for at least 6 months after stopping it. Women should inform their doctor if they wish to become pregnant or think they might be pregnant. Women of child-bearing potential will need to have a negative pregnancy test before starting this medicine. There is a potential for serious side effects to an unborn child. Talk to your health care professional or pharmacist for more information. Do not breast-feed an infant while taking this medicine. Men must use a latex condom during sexual contact with a woman while taking this medicine and for at least 4 months after stopping it. A latex condom is needed even if you have had a vasectomy. Contact your doctor right away if your partner becomes pregnant. Do  not donate sperm while taking this medicine and for at least 4 months after you stop taking this medicine. Men should inform their doctors if they wish to father a child. This medicine may lower sperm counts. What side effects may I notice from receiving this medicine? Side effects that  you should report to your doctor or health care professional as soon as possible: -allergic reactions like skin rash, itching or hives, swelling of the face, lips, or tongue -low blood counts - this medicine may decrease the number of white blood cells, red blood cells and platelets. You may be at increased risk for infections and bleeding. -signs of infection - fever or chills, cough, sore throat, pain or difficulty passing urine -signs of decreased platelets or bleeding - bruising, pinpoint red spots on the skin, black, tarry stools, blood in the urine -signs of decreased red blood cells - unusually weak or tired, fainting spells, lightheadedness -breathing problems -changes in vision -mouth or throat sores or ulcers -pain, redness, swelling or irritation at the injection site -pain, tingling, numbness in the hands or feet -redness, blistering, peeling or loosening of the skin, including inside the mouth -seizures -vomiting Side effects that usually do not require medical attention (report to your doctor or health care professional if they continue or are bothersome): -diarrhea -hair loss -loss of appetite -nausea -stomach pain This list may not describe all possible side effects. Call your doctor for medical advice about side effects. You may report side effects to FDA at 1-800-FDA-1088. Where should I keep my medicine? This drug is given in a hospital or clinic and will not be stored at home. NOTE: This sheet is a summary. It may not cover all possible information. If you have questions about this medicine, talk to your doctor, pharmacist, or health care provider.  2019 Elsevier/Gold Standard (2015-09-09 11:53:23) Carboplatin injection What is this medicine? CARBOPLATIN (KAR boe pla tin) is a chemotherapy drug. It targets fast dividing cells, like cancer cells, and causes these cells to die. This medicine is used to treat ovarian cancer and many other cancers. This medicine may be  used for other purposes; ask your health care provider or pharmacist if you have questions. COMMON BRAND NAME(S): Paraplatin What should I tell my health care provider before I take this medicine? They need to know if you have any of these conditions: -blood disorders -hearing problems -kidney disease -recent or ongoing radiation therapy -an unusual or allergic reaction to carboplatin, cisplatin, other chemotherapy, other medicines, foods, dyes, or preservatives -pregnant or trying to get pregnant -breast-feeding How should I use this medicine? This drug is usually given as an infusion into a vein. It is administered in a hospital or clinic by a specially trained health care professional. Talk to your pediatrician regarding the use of this medicine in children. Special care may be needed. Overdosage: If you think you have taken too much of this medicine contact a poison control center or emergency room at once. NOTE: This medicine is only for you. Do not share this medicine with others. What if I miss a dose? It is important not to miss a dose. Call your doctor or health care professional if you are unable to keep an appointment. What may interact with this medicine? -medicines for seizures -medicines to increase blood counts like filgrastim, pegfilgrastim, sargramostim -some antibiotics like amikacin, gentamicin, neomycin, streptomycin, tobramycin -vaccines Talk to your doctor or health care professional before taking any of these medicines: -acetaminophen -aspirin -ibuprofen -ketoprofen -naproxen  This list may not describe all possible interactions. Give your health care provider a list of all the medicines, herbs, non-prescription drugs, or dietary supplements you use. Also tell them if you smoke, drink alcohol, or use illegal drugs. Some items may interact with your medicine. What should I watch for while using this medicine? Your condition will be monitored carefully while you are  receiving this medicine. You will need important blood work done while you are taking this medicine. This drug may make you feel generally unwell. This is not uncommon, as chemotherapy can affect healthy cells as well as cancer cells. Report any side effects. Continue your course of treatment even though you feel ill unless your doctor tells you to stop. In some cases, you may be given additional medicines to help with side effects. Follow all directions for their use. Call your doctor or health care professional for advice if you get a fever, chills or sore throat, or other symptoms of a cold or flu. Do not treat yourself. This drug decreases your body's ability to fight infections. Try to avoid being around people who are sick. This medicine may increase your risk to bruise or bleed. Call your doctor or health care professional if you notice any unusual bleeding. Be careful brushing and flossing your teeth or using a toothpick because you may get an infection or bleed more easily. If you have any dental work done, tell your dentist you are receiving this medicine. Avoid taking products that contain aspirin, acetaminophen, ibuprofen, naproxen, or ketoprofen unless instructed by your doctor. These medicines may hide a fever. Do not become pregnant while taking this medicine. Women should inform their doctor if they wish to become pregnant or think they might be pregnant. There is a potential for serious side effects to an unborn child. Talk to your health care professional or pharmacist for more information. Do not breast-feed an infant while taking this medicine. What side effects may I notice from receiving this medicine? Side effects that you should report to your doctor or health care professional as soon as possible: -allergic reactions like skin rash, itching or hives, swelling of the face, lips, or tongue -signs of infection - fever or chills, cough, sore throat, pain or difficulty passing  urine -signs of decreased platelets or bleeding - bruising, pinpoint red spots on the skin, black, tarry stools, nosebleeds -signs of decreased red blood cells - unusually weak or tired, fainting spells, lightheadedness -breathing problems -changes in hearing -changes in vision -chest pain -high blood pressure -low blood counts - This drug may decrease the number of white blood cells, red blood cells and platelets. You may be at increased risk for infections and bleeding. -nausea and vomiting -pain, swelling, redness or irritation at the injection site -pain, tingling, numbness in the hands or feet -problems with balance, talking, walking -trouble passing urine or change in the amount of urine Side effects that usually do not require medical attention (report to your doctor or health care professional if they continue or are bothersome): -hair loss -loss of appetite -metallic taste in the mouth or changes in taste This list may not describe all possible side effects. Call your doctor for medical advice about side effects. You may report side effects to FDA at 1-800-FDA-1088. Where should I keep my medicine? This drug is given in a hospital or clinic and will not be stored at home. NOTE: This sheet is a summary. It may not cover all possible information. If you have  questions about this medicine, talk to your doctor, pharmacist, or health care provider.  2019 Elsevier/Gold Standard (2007-12-23 14:38:05)

## 2019-03-03 ENCOUNTER — Inpatient Hospital Stay: Payer: Medicare Other | Attending: Oncology

## 2019-03-03 ENCOUNTER — Other Ambulatory Visit: Payer: Self-pay

## 2019-03-03 ENCOUNTER — Telehealth: Payer: Self-pay

## 2019-03-03 DIAGNOSIS — D72829 Elevated white blood cell count, unspecified: Secondary | ICD-10-CM | POA: Insufficient documentation

## 2019-03-03 DIAGNOSIS — C3411 Malignant neoplasm of upper lobe, right bronchus or lung: Secondary | ICD-10-CM | POA: Insufficient documentation

## 2019-03-03 DIAGNOSIS — I10 Essential (primary) hypertension: Secondary | ICD-10-CM | POA: Insufficient documentation

## 2019-03-03 DIAGNOSIS — E876 Hypokalemia: Secondary | ICD-10-CM | POA: Insufficient documentation

## 2019-03-03 DIAGNOSIS — E860 Dehydration: Secondary | ICD-10-CM | POA: Insufficient documentation

## 2019-03-03 DIAGNOSIS — C3490 Malignant neoplasm of unspecified part of unspecified bronchus or lung: Secondary | ICD-10-CM | POA: Diagnosis not present

## 2019-03-03 DIAGNOSIS — R350 Frequency of micturition: Secondary | ICD-10-CM | POA: Insufficient documentation

## 2019-03-03 DIAGNOSIS — Z5111 Encounter for antineoplastic chemotherapy: Secondary | ICD-10-CM | POA: Insufficient documentation

## 2019-03-03 DIAGNOSIS — Z5189 Encounter for other specified aftercare: Secondary | ICD-10-CM | POA: Insufficient documentation

## 2019-03-03 DIAGNOSIS — R102 Pelvic and perineal pain: Secondary | ICD-10-CM | POA: Insufficient documentation

## 2019-03-03 DIAGNOSIS — C187 Malignant neoplasm of sigmoid colon: Secondary | ICD-10-CM | POA: Insufficient documentation

## 2019-03-03 NOTE — Telephone Encounter (Signed)
Call to patient to notify of Pre Admit testing. Patient is scheduled for Pre Admit testing at [facility] on 03/09/19 at 11:45 am. They will also do her Covid testing at that time. She will need to enter in through the [facility]. The patient is aware of date, time, and instructions.

## 2019-03-05 DIAGNOSIS — Z87440 Personal history of urinary (tract) infections: Secondary | ICD-10-CM | POA: Diagnosis not present

## 2019-03-05 DIAGNOSIS — M48061 Spinal stenosis, lumbar region without neurogenic claudication: Secondary | ICD-10-CM | POA: Diagnosis not present

## 2019-03-05 DIAGNOSIS — G894 Chronic pain syndrome: Secondary | ICD-10-CM | POA: Diagnosis not present

## 2019-03-05 DIAGNOSIS — M7500 Adhesive capsulitis of unspecified shoulder: Secondary | ICD-10-CM | POA: Diagnosis not present

## 2019-03-05 DIAGNOSIS — M80062D Age-related osteoporosis with current pathological fracture, left lower leg, subsequent encounter for fracture with routine healing: Secondary | ICD-10-CM | POA: Diagnosis not present

## 2019-03-05 DIAGNOSIS — K219 Gastro-esophageal reflux disease without esophagitis: Secondary | ICD-10-CM | POA: Diagnosis not present

## 2019-03-05 DIAGNOSIS — C189 Malignant neoplasm of colon, unspecified: Secondary | ICD-10-CM | POA: Diagnosis not present

## 2019-03-05 DIAGNOSIS — Z85038 Personal history of other malignant neoplasm of large intestine: Secondary | ICD-10-CM | POA: Diagnosis not present

## 2019-03-05 DIAGNOSIS — Z9181 History of falling: Secondary | ICD-10-CM | POA: Diagnosis not present

## 2019-03-05 DIAGNOSIS — I10 Essential (primary) hypertension: Secondary | ICD-10-CM | POA: Diagnosis not present

## 2019-03-05 DIAGNOSIS — M80071D Age-related osteoporosis with current pathological fracture, right ankle and foot, subsequent encounter for fracture with routine healing: Secondary | ICD-10-CM | POA: Diagnosis not present

## 2019-03-05 DIAGNOSIS — J40 Bronchitis, not specified as acute or chronic: Secondary | ICD-10-CM | POA: Diagnosis not present

## 2019-03-05 DIAGNOSIS — Z433 Encounter for attention to colostomy: Secondary | ICD-10-CM | POA: Diagnosis not present

## 2019-03-05 DIAGNOSIS — M450 Ankylosing spondylitis of multiple sites in spine: Secondary | ICD-10-CM | POA: Diagnosis not present

## 2019-03-05 DIAGNOSIS — Z7901 Long term (current) use of anticoagulants: Secondary | ICD-10-CM | POA: Diagnosis not present

## 2019-03-05 DIAGNOSIS — M4802 Spinal stenosis, cervical region: Secondary | ICD-10-CM | POA: Diagnosis not present

## 2019-03-05 DIAGNOSIS — M4692 Unspecified inflammatory spondylopathy, cervical region: Secondary | ICD-10-CM | POA: Diagnosis not present

## 2019-03-06 DIAGNOSIS — E785 Hyperlipidemia, unspecified: Secondary | ICD-10-CM | POA: Insufficient documentation

## 2019-03-06 DIAGNOSIS — I10 Essential (primary) hypertension: Secondary | ICD-10-CM | POA: Diagnosis not present

## 2019-03-06 DIAGNOSIS — R0789 Other chest pain: Secondary | ICD-10-CM | POA: Diagnosis not present

## 2019-03-09 ENCOUNTER — Ambulatory Visit: Payer: Medicare Other

## 2019-03-09 ENCOUNTER — Other Ambulatory Visit: Payer: Self-pay

## 2019-03-09 ENCOUNTER — Encounter
Admission: RE | Admit: 2019-03-09 | Discharge: 2019-03-09 | Disposition: A | Discharge status: Home / Self Care | Payer: Medicare Other | Source: Ambulatory Visit | Attending: Surgery | Admitting: Surgery

## 2019-03-09 DIAGNOSIS — Z888 Allergy status to other drugs, medicaments and biological substances status: Secondary | ICD-10-CM | POA: Diagnosis not present

## 2019-03-09 DIAGNOSIS — F329 Major depressive disorder, single episode, unspecified: Secondary | ICD-10-CM | POA: Diagnosis not present

## 2019-03-09 DIAGNOSIS — F419 Anxiety disorder, unspecified: Secondary | ICD-10-CM | POA: Diagnosis not present

## 2019-03-09 DIAGNOSIS — Z1159 Encounter for screening for other viral diseases: Secondary | ICD-10-CM | POA: Insufficient documentation

## 2019-03-09 DIAGNOSIS — Z932 Ileostomy status: Secondary | ICD-10-CM | POA: Diagnosis not present

## 2019-03-09 DIAGNOSIS — I10 Essential (primary) hypertension: Secondary | ICD-10-CM | POA: Diagnosis not present

## 2019-03-09 DIAGNOSIS — Z85038 Personal history of other malignant neoplasm of large intestine: Secondary | ICD-10-CM | POA: Diagnosis not present

## 2019-03-09 DIAGNOSIS — Z01812 Encounter for preprocedural laboratory examination: Secondary | ICD-10-CM | POA: Insufficient documentation

## 2019-03-09 DIAGNOSIS — Z87891 Personal history of nicotine dependence: Secondary | ICD-10-CM | POA: Diagnosis not present

## 2019-03-09 DIAGNOSIS — Z510 Encounter for antineoplastic radiation therapy: Secondary | ICD-10-CM | POA: Diagnosis not present

## 2019-03-09 DIAGNOSIS — J45909 Unspecified asthma, uncomplicated: Secondary | ICD-10-CM | POA: Diagnosis not present

## 2019-03-09 DIAGNOSIS — G8929 Other chronic pain: Secondary | ICD-10-CM | POA: Diagnosis not present

## 2019-03-09 DIAGNOSIS — C3411 Malignant neoplasm of upper lobe, right bronchus or lung: Secondary | ICD-10-CM | POA: Diagnosis not present

## 2019-03-09 DIAGNOSIS — K219 Gastro-esophageal reflux disease without esophagitis: Secondary | ICD-10-CM | POA: Diagnosis not present

## 2019-03-09 DIAGNOSIS — Z882 Allergy status to sulfonamides status: Secondary | ICD-10-CM | POA: Diagnosis not present

## 2019-03-09 DIAGNOSIS — M1990 Unspecified osteoarthritis, unspecified site: Secondary | ICD-10-CM | POA: Diagnosis not present

## 2019-03-09 DIAGNOSIS — K6289 Other specified diseases of anus and rectum: Secondary | ICD-10-CM | POA: Diagnosis not present

## 2019-03-09 NOTE — Patient Instructions (Signed)
Your procedure is scheduled MW:UXLKG. 6/11 Report to Day Surgery. To find out your arrival time please call [phone] between 1PM - 3PM on Wed. 6/10.  Remember: Instructions that are not followed completely may result in serious medical risk,  up to and including death, or upon the discretion of your surgeon and anesthesiologist your  surgery may need to be rescheduled.     _X__ 1. Do not eat food after midnight the night before your procedure.                 No gum chewing or hard candies. You may drink clear liquids up to 2 hours                 before you are scheduled to arrive for your surgery- DO not drink clear                 liquids within 2 hours of the start of your surgery.                 Clear Liquids include:  water, apple juice without pulp, clear carbohydrate                 drink such as Clearfast of Gatorade, Black Coffee or Tea (Do not add                 anything to coffee or tea).  __X__2.  On the morning of surgery brush your teeth with toothpaste and water, you                may rinse your mouth with mouthwash if you wish.  Do not swallow any toothpaste of mouthwash.     _X__ 3.  No Alcohol for 24 hours before or after surgery.   ___ 4.  Do Not Smoke or use e-cigarettes For 24 Hours Prior to Your Surgery.                 Do not use any chewable tobacco products for at least 6 hours prior to                 surgery.  ____  5.  Bring all medications with you on the day of surgery if instructed.   ___  6.  Notify your doctor if there is any change in your medical condition      (cold, fever, infections).     Do not wear jewelry, make-up, hairpins, clips or nail polish. Do not wear lotions, powders, or perfumes. You may wear deodorant. Do not shave 48 hours prior to surgery. Men may shave face and neck. Do not bring valuables to the hospital.    [facility] is not responsible for any belongings or valuables.  Contacts, dentures or  bridgework may not be worn into surgery. Leave your suitcase in the car. After surgery it may be brought to your room. For patients admitted to the hospital, discharge time is determined by your treatment team.   Patients discharged the day of surgery will not be allowed to drive home.   Please read over the following fact sheets that you were given:     ___x_ Take these medicines the morning of surgery with A SIP OF WATER:    1. clonazePAM (KLONOPIN) 0.5 MG tablet  2. cloNIDine (CATAPRES) 0.3 MG tablet  3. hydrALAZINE (APRESOLINE) 100 MG tablet  4.metoprolol succinate (TOPROL-XL) 100 MG 24 hr tablet  5.omeprazole (PRILOSEC) 40 MG capsule take  dose night before and morning of the surgery.  6.LORazepam (ATIVAN) 0.5 MG tablet if needed  ____ Fleet Enema (as directed)   _x___ Use CHG Soap as directed  __x__ Use inhalers on the day of surgery albuterol (PROVENTIL HFA;VENTOLIN HFA) 108 (90 Base) MCG/ACT inhaler and bring with you ANORO ELLIPTA 62.5-25 MCG/INH AEPB, BREO ELLIPTA 200-25 MCG/INH AEPB  ____ Stop metformin 2 days prior to surgery    ____ Take 1/2 of usual insulin dose the night before surgery. No insulin the morning          of surgery.   ____ Stop Coumadin/Plavix/aspirin on   ____ Stop Anti-inflammatories on    ____ Stop supplements until after surgery.    ____ Bring C-Pap to the hospital.

## 2019-03-10 ENCOUNTER — Ambulatory Visit: Payer: Medicare Other

## 2019-03-10 ENCOUNTER — Ambulatory Visit
Admission: RE | Admit: 2019-03-10 | Discharge: 2019-03-10 | Disposition: A | Discharge status: Home / Self Care | Payer: Medicare Other | Source: Ambulatory Visit | Attending: Radiation Oncology | Admitting: Radiation Oncology

## 2019-03-10 ENCOUNTER — Other Ambulatory Visit: Payer: Self-pay

## 2019-03-10 ENCOUNTER — Ambulatory Visit: Payer: Medicare Other | Admitting: Oncology

## 2019-03-10 ENCOUNTER — Other Ambulatory Visit: Payer: Medicare Other

## 2019-03-10 DIAGNOSIS — J40 Bronchitis, not specified as acute or chronic: Secondary | ICD-10-CM | POA: Diagnosis not present

## 2019-03-10 DIAGNOSIS — M48061 Spinal stenosis, lumbar region without neurogenic claudication: Secondary | ICD-10-CM | POA: Diagnosis not present

## 2019-03-10 DIAGNOSIS — M450 Ankylosing spondylitis of multiple sites in spine: Secondary | ICD-10-CM | POA: Diagnosis not present

## 2019-03-10 DIAGNOSIS — Z87440 Personal history of urinary (tract) infections: Secondary | ICD-10-CM | POA: Diagnosis not present

## 2019-03-10 DIAGNOSIS — M4802 Spinal stenosis, cervical region: Secondary | ICD-10-CM | POA: Diagnosis not present

## 2019-03-10 DIAGNOSIS — Z7901 Long term (current) use of anticoagulants: Secondary | ICD-10-CM | POA: Diagnosis not present

## 2019-03-10 DIAGNOSIS — Z87891 Personal history of nicotine dependence: Secondary | ICD-10-CM | POA: Diagnosis not present

## 2019-03-10 DIAGNOSIS — K219 Gastro-esophageal reflux disease without esophagitis: Secondary | ICD-10-CM | POA: Diagnosis not present

## 2019-03-10 DIAGNOSIS — I10 Essential (primary) hypertension: Secondary | ICD-10-CM | POA: Diagnosis not present

## 2019-03-10 DIAGNOSIS — Z85038 Personal history of other malignant neoplasm of large intestine: Secondary | ICD-10-CM | POA: Diagnosis not present

## 2019-03-10 DIAGNOSIS — G894 Chronic pain syndrome: Secondary | ICD-10-CM | POA: Diagnosis not present

## 2019-03-10 DIAGNOSIS — M7500 Adhesive capsulitis of unspecified shoulder: Secondary | ICD-10-CM | POA: Diagnosis not present

## 2019-03-10 DIAGNOSIS — C3411 Malignant neoplasm of upper lobe, right bronchus or lung: Secondary | ICD-10-CM | POA: Diagnosis not present

## 2019-03-10 DIAGNOSIS — M4692 Unspecified inflammatory spondylopathy, cervical region: Secondary | ICD-10-CM | POA: Diagnosis not present

## 2019-03-10 DIAGNOSIS — Z9181 History of falling: Secondary | ICD-10-CM | POA: Diagnosis not present

## 2019-03-10 DIAGNOSIS — M80062D Age-related osteoporosis with current pathological fracture, left lower leg, subsequent encounter for fracture with routine healing: Secondary | ICD-10-CM | POA: Diagnosis not present

## 2019-03-10 DIAGNOSIS — M80071D Age-related osteoporosis with current pathological fracture, right ankle and foot, subsequent encounter for fracture with routine healing: Secondary | ICD-10-CM | POA: Diagnosis not present

## 2019-03-10 DIAGNOSIS — Z433 Encounter for attention to colostomy: Secondary | ICD-10-CM | POA: Diagnosis not present

## 2019-03-10 DIAGNOSIS — Z510 Encounter for antineoplastic radiation therapy: Secondary | ICD-10-CM | POA: Diagnosis not present

## 2019-03-10 LAB — NOVEL CORONAVIRUS, NAA (HOSP ORDER, SEND-OUT TO REF LAB; TAT 18-24 HRS): SARS-CoV-2, NAA: NOT DETECTED

## 2019-03-11 ENCOUNTER — Ambulatory Visit: Payer: Medicare Other

## 2019-03-11 ENCOUNTER — Other Ambulatory Visit: Payer: Self-pay

## 2019-03-11 ENCOUNTER — Ambulatory Visit
Admission: RE | Admit: 2019-03-11 | Discharge: 2019-03-11 | Disposition: A | Discharge status: Home / Self Care | Payer: Medicare Other | Source: Ambulatory Visit | Attending: Radiation Oncology | Admitting: Radiation Oncology

## 2019-03-11 DIAGNOSIS — C3411 Malignant neoplasm of upper lobe, right bronchus or lung: Secondary | ICD-10-CM | POA: Diagnosis not present

## 2019-03-11 DIAGNOSIS — Z510 Encounter for antineoplastic radiation therapy: Secondary | ICD-10-CM | POA: Diagnosis not present

## 2019-03-11 DIAGNOSIS — Z87891 Personal history of nicotine dependence: Secondary | ICD-10-CM | POA: Diagnosis not present

## 2019-03-11 MED ORDER — CEFAZOLIN SODIUM-DEXTROSE 2-4 GM/100ML-% IV SOLN
2.0000 g | INTRAVENOUS | Status: AC
  Administered 2019-03-12: 2 g via INTRAVENOUS

## 2019-03-11 MED ORDER — DEXAMETHASONE SODIUM PHOSPHATE 10 MG/ML IJ SOLN
INTRAMUSCULAR | Status: AC
  Filled 2019-03-11: qty 1

## 2019-03-12 ENCOUNTER — Ambulatory Visit: Payer: Medicare Other

## 2019-03-12 ENCOUNTER — Ambulatory Visit
Admission: RE | Admit: 2019-03-12 | Discharge: 2019-03-12 | Disposition: A | Discharge status: Home / Self Care | Payer: Medicare Other | Source: Ambulatory Visit | Attending: Surgery | Admitting: Surgery

## 2019-03-12 ENCOUNTER — Ambulatory Visit: Payer: Medicare Other | Admitting: Anesthesiology

## 2019-03-12 ENCOUNTER — Encounter
Admission: RE | Disposition: A | Discharge status: Home / Self Care | Payer: Self-pay | Source: Ambulatory Visit | Attending: Surgery

## 2019-03-12 ENCOUNTER — Encounter: Payer: Self-pay | Admitting: Anesthesiology

## 2019-03-12 ENCOUNTER — Other Ambulatory Visit: Payer: Self-pay

## 2019-03-12 ENCOUNTER — Ambulatory Visit
Admission: RE | Admit: 2019-03-12 | Discharge: 2019-03-12 | Disposition: A | Discharge status: Home / Self Care | Payer: Medicare Other | Source: Ambulatory Visit | Attending: Radiation Oncology | Admitting: Radiation Oncology

## 2019-03-12 DIAGNOSIS — J45909 Unspecified asthma, uncomplicated: Secondary | ICD-10-CM | POA: Diagnosis not present

## 2019-03-12 DIAGNOSIS — Z510 Encounter for antineoplastic radiation therapy: Secondary | ICD-10-CM | POA: Diagnosis not present

## 2019-03-12 DIAGNOSIS — Z452 Encounter for adjustment and management of vascular access device: Secondary | ICD-10-CM | POA: Diagnosis not present

## 2019-03-12 DIAGNOSIS — C3411 Malignant neoplasm of upper lobe, right bronchus or lung: Secondary | ICD-10-CM | POA: Insufficient documentation

## 2019-03-12 DIAGNOSIS — F329 Major depressive disorder, single episode, unspecified: Secondary | ICD-10-CM | POA: Insufficient documentation

## 2019-03-12 DIAGNOSIS — Z932 Ileostomy status: Secondary | ICD-10-CM | POA: Diagnosis not present

## 2019-03-12 DIAGNOSIS — M1990 Unspecified osteoarthritis, unspecified site: Secondary | ICD-10-CM | POA: Insufficient documentation

## 2019-03-12 DIAGNOSIS — K6289 Other specified diseases of anus and rectum: Secondary | ICD-10-CM | POA: Diagnosis not present

## 2019-03-12 DIAGNOSIS — Z888 Allergy status to other drugs, medicaments and biological substances status: Secondary | ICD-10-CM | POA: Insufficient documentation

## 2019-03-12 DIAGNOSIS — Z87891 Personal history of nicotine dependence: Secondary | ICD-10-CM | POA: Insufficient documentation

## 2019-03-12 DIAGNOSIS — C3490 Malignant neoplasm of unspecified part of unspecified bronchus or lung: Secondary | ICD-10-CM | POA: Diagnosis not present

## 2019-03-12 DIAGNOSIS — Z95828 Presence of other vascular implants and grafts: Secondary | ICD-10-CM

## 2019-03-12 DIAGNOSIS — Z1159 Encounter for screening for other viral diseases: Secondary | ICD-10-CM | POA: Diagnosis not present

## 2019-03-12 DIAGNOSIS — Z882 Allergy status to sulfonamides status: Secondary | ICD-10-CM | POA: Insufficient documentation

## 2019-03-12 DIAGNOSIS — K219 Gastro-esophageal reflux disease without esophagitis: Secondary | ICD-10-CM | POA: Diagnosis not present

## 2019-03-12 DIAGNOSIS — Z85038 Personal history of other malignant neoplasm of large intestine: Secondary | ICD-10-CM | POA: Diagnosis not present

## 2019-03-12 DIAGNOSIS — G8929 Other chronic pain: Secondary | ICD-10-CM | POA: Insufficient documentation

## 2019-03-12 DIAGNOSIS — C187 Malignant neoplasm of sigmoid colon: Secondary | ICD-10-CM | POA: Diagnosis not present

## 2019-03-12 DIAGNOSIS — I10 Essential (primary) hypertension: Secondary | ICD-10-CM | POA: Diagnosis not present

## 2019-03-12 DIAGNOSIS — F419 Anxiety disorder, unspecified: Secondary | ICD-10-CM | POA: Insufficient documentation

## 2019-03-12 HISTORY — PX: PORTACATH PLACEMENT: SHX2246

## 2019-03-12 LAB — URINE DRUG SCREEN, QUALITATIVE (ARMC ONLY)
Amphetamines, Ur Screen: NOT DETECTED
Barbiturates, Ur Screen: NOT DETECTED
Benzodiazepine, Ur Scrn: POSITIVE — AB
Cannabinoid 50 Ng, Ur ~~LOC~~: POSITIVE — AB
Cocaine Metabolite,Ur ~~LOC~~: NOT DETECTED
MDMA (Ecstasy)Ur Screen: NOT DETECTED
Methadone Scn, Ur: NOT DETECTED
Opiate, Ur Screen: POSITIVE — AB
Phencyclidine (PCP) Ur S: NOT DETECTED
Tricyclic, Ur Screen: POSITIVE — AB

## 2019-03-12 SURGERY — INSERTION, TUNNELED CENTRAL VENOUS DEVICE, WITH PORT
Anesthesia: Monitor Anesthesia Care

## 2019-03-12 MED ORDER — ONDANSETRON HCL 4 MG/2ML IJ SOLN: 4.0000 mg | Freq: Once | INTRAMUSCULAR | Status: DC | PRN

## 2019-03-12 MED ORDER — CHLORHEXIDINE GLUCONATE CLOTH 2 % EX PADS
6.0000 | MEDICATED_PAD | Freq: Once | CUTANEOUS | Status: AC
  Administered 2019-03-12: 12:00:00 6 via TOPICAL

## 2019-03-12 MED ORDER — FENTANYL CITRATE (PF) 100 MCG/2ML IJ SOLN
INTRAMUSCULAR | Status: DC | PRN
  Administered 2019-03-12 (×2): 25 ug via INTRAVENOUS

## 2019-03-12 MED ORDER — CEFAZOLIN SODIUM-DEXTROSE 2-4 GM/100ML-% IV SOLN
INTRAVENOUS | Status: AC
  Filled 2019-03-12: qty 100

## 2019-03-12 MED ORDER — MIDAZOLAM HCL 2 MG/2ML IJ SOLN
INTRAMUSCULAR | Status: AC
  Filled 2019-03-12: qty 2

## 2019-03-12 MED ORDER — METOPROLOL TARTRATE 5 MG/5ML IV SOLN
INTRAVENOUS | Status: AC
  Administered 2019-03-12: 5 mg via INTRAVENOUS
  Filled 2019-03-12: qty 5

## 2019-03-12 MED ORDER — HYDROCODONE-ACETAMINOPHEN 5-325 MG PO TABS: 1.0000 | ORAL_TABLET | Freq: Four times a day (QID) | ORAL | 0 refills | Status: DC | PRN

## 2019-03-12 MED ORDER — FENTANYL CITRATE (PF) 100 MCG/2ML IJ SOLN: 25.0000 ug | INTRAMUSCULAR | Status: DC | PRN

## 2019-03-12 MED ORDER — LIDOCAINE HCL (PF) 1 % IJ SOLN
INTRAMUSCULAR | Status: DC | PRN
  Administered 2019-03-12: 10 mL

## 2019-03-12 MED ORDER — LIDOCAINE HCL (PF) 2 % IJ SOLN
INTRAMUSCULAR | Status: AC
  Filled 2019-03-12: qty 10

## 2019-03-12 MED ORDER — FENTANYL CITRATE (PF) 100 MCG/2ML IJ SOLN
INTRAMUSCULAR | Status: AC
  Filled 2019-03-12: qty 2

## 2019-03-12 MED ORDER — GABAPENTIN 300 MG PO CAPS
ORAL_CAPSULE | ORAL | Status: AC
  Administered 2019-03-12: 12:00:00 300 mg via ORAL
  Filled 2019-03-12: qty 1

## 2019-03-12 MED ORDER — HYDRALAZINE HCL 20 MG/ML IJ SOLN
INTRAMUSCULAR | Status: AC
  Administered 2019-03-12: 15:00:00 10 mg via INTRAVENOUS
  Filled 2019-03-12: qty 1

## 2019-03-12 MED ORDER — ACETAMINOPHEN 500 MG PO TABS
ORAL_TABLET | ORAL | Status: AC
  Administered 2019-03-12: 12:00:00 1000 mg via ORAL
  Filled 2019-03-12: qty 2

## 2019-03-12 MED ORDER — SODIUM CHLORIDE 0.9 % IV SOLN
INTRAVENOUS | Status: DC | PRN
  Administered 2019-03-12: 14:00:00 5 mL via INTRAMUSCULAR

## 2019-03-12 MED ORDER — ACETAMINOPHEN 500 MG PO TABS
1000.0000 mg | ORAL_TABLET | ORAL | Status: AC
  Administered 2019-03-12: 1000 mg via ORAL

## 2019-03-12 MED ORDER — MIDAZOLAM HCL 2 MG/2ML IJ SOLN
INTRAMUSCULAR | Status: DC | PRN
  Administered 2019-03-12 (×2): 1 mg via INTRAVENOUS

## 2019-03-12 MED ORDER — PROPOFOL 10 MG/ML IV BOLUS
INTRAVENOUS | Status: DC | PRN
  Administered 2019-03-12: 30 mg via INTRAVENOUS

## 2019-03-12 MED ORDER — METOPROLOL TARTRATE 5 MG/5ML IV SOLN
5.0000 mg | INTRAVENOUS | Status: DC | PRN
  Administered 2019-03-12: 14:00:00 5 mg via INTRAVENOUS

## 2019-03-12 MED ORDER — PROPOFOL 500 MG/50ML IV EMUL
INTRAVENOUS | Status: DC | PRN
  Administered 2019-03-12: 150 ug/kg/min via INTRAVENOUS

## 2019-03-12 MED ORDER — BUPIVACAINE-EPINEPHRINE (PF) 0.25% -1:200000 IJ SOLN
INTRAMUSCULAR | Status: AC
  Filled 2019-03-12: qty 30

## 2019-03-12 MED ORDER — BUPIVACAINE-EPINEPHRINE (PF) 0.25% -1:200000 IJ SOLN
INTRAMUSCULAR | Status: DC | PRN
  Administered 2019-03-12: 10 mL via PERINEURAL

## 2019-03-12 MED ORDER — CHLORHEXIDINE GLUCONATE CLOTH 2 % EX PADS: 6.0000 | MEDICATED_PAD | Freq: Once | CUTANEOUS | Status: DC

## 2019-03-12 MED ORDER — HEPARIN SODIUM (PORCINE) 5000 UNIT/ML IJ SOLN
INTRAMUSCULAR | Status: AC
  Filled 2019-03-12: qty 1

## 2019-03-12 MED ORDER — LOPERAMIDE HCL 2 MG PO CAPS: 4.0000 mg | ORAL_CAPSULE | Freq: Three times a day (TID) | ORAL | 2 refills | Status: DC

## 2019-03-12 MED ORDER — MEGESTROL ACETATE 400 MG/10ML PO SUSP: 400.0000 mg | Freq: Every day | ORAL | 2 refills | Status: DC

## 2019-03-12 MED ORDER — LACTATED RINGERS IV SOLN
INTRAVENOUS | Status: DC
  Administered 2019-03-12: 13:00:00 via INTRAVENOUS

## 2019-03-12 MED ORDER — PROPOFOL 10 MG/ML IV BOLUS
INTRAVENOUS | Status: AC
  Filled 2019-03-12: qty 20

## 2019-03-12 MED ORDER — HYDRALAZINE HCL 20 MG/ML IJ SOLN
10.0000 mg | Freq: Once | INTRAMUSCULAR | Status: AC
  Administered 2019-03-12: 15:00:00 10 mg via INTRAVENOUS

## 2019-03-12 MED ORDER — GABAPENTIN 300 MG PO CAPS
300.0000 mg | ORAL_CAPSULE | ORAL | Status: AC
  Administered 2019-03-12: 300 mg via ORAL

## 2019-03-12 MED ORDER — LIDOCAINE HCL (PF) 1 % IJ SOLN
INTRAMUSCULAR | Status: AC
  Filled 2019-03-12: qty 30

## 2019-03-12 SURGICAL SUPPLY — 37 items
ADH SKN CLS APL DERMABOND .7 (GAUZE/BANDAGES/DRESSINGS) ×1
APL PRP STRL LF DISP 70% ISPRP (MISCELLANEOUS) ×1
BAG DECANTER FOR FLEXI CONT (MISCELLANEOUS) ×5 IMPLANT
BLADE SURG SZ11 CARB STEEL (BLADE) ×3 IMPLANT
BOOT SUTURE AID YELLOW STND (SUTURE) ×3 IMPLANT
CANISTER SUCT 1200ML W/VALVE (MISCELLANEOUS) ×3 IMPLANT
CHLORAPREP W/TINT 26 (MISCELLANEOUS) ×3 IMPLANT
COVER LIGHT HANDLE STERIS (MISCELLANEOUS) ×6 IMPLANT
COVER WAND RF STERILE (DRAPES) ×3 IMPLANT
DERMABOND ADVANCED (GAUZE/BANDAGES/DRESSINGS) ×2
DERMABOND ADVANCED .7 DNX12 (GAUZE/BANDAGES/DRESSINGS) ×1 IMPLANT
DRAPE C-ARM XRAY 36X54 (DRAPES) ×6 IMPLANT
DRAPE INCISE IOBAN 66X45 STRL (DRAPES) ×3 IMPLANT
DRAPE SHEET LG 3/4 BI-LAMINATE (DRAPES) ×3 IMPLANT
ELECT CAUTERY BLADE 6.4 (BLADE) ×3 IMPLANT
ELECT REM PT RETURN 9FT ADLT (ELECTROSURGICAL) ×3
ELECTRODE REM PT RTRN 9FT ADLT (ELECTROSURGICAL) ×1 IMPLANT
GEL ULTRASOUND 20GR AQUASONIC (MISCELLANEOUS) ×3 IMPLANT
GLOVE BIO SURGEON STRL SZ7 (GLOVE) ×5 IMPLANT
GOWN STRL REUS W/ TWL LRG LVL3 (GOWN DISPOSABLE) ×2 IMPLANT
GOWN STRL REUS W/TWL LRG LVL3 (GOWN DISPOSABLE) ×6
IV NS 500ML (IV SOLUTION) ×3
IV NS 500ML BAXH (IV SOLUTION) ×1 IMPLANT
KIT PORT POWER 8FR ISP CVUE (Port) ×3 IMPLANT
NEEDLE HYPO 22GX1.5 SAFETY (NEEDLE) ×3 IMPLANT
NS IRRIG 1000ML POUR BTL (IV SOLUTION) ×1 IMPLANT
PACK PORT-A-CATH (MISCELLANEOUS) ×3 IMPLANT
SPONGE LAP 18X18 RF (DISPOSABLE) ×3 IMPLANT
SUT MNCRL AB 4-0 PS2 18 (SUTURE) ×3 IMPLANT
SUT PROLENE 2-0 (SUTURE) ×3
SUT PROLENE 2-0 RB1 36X2 ARM (SUTURE) ×1
SUT VIC AB 3-0 SH 27 (SUTURE) ×3
SUT VIC AB 3-0 SH 27X BRD (SUTURE) ×1 IMPLANT
SUTURE PROLEN 2-0 RB1 36X2 ARM (SUTURE) ×1 IMPLANT
SYR 20CC LL (SYRINGE) ×3 IMPLANT
SYR 5ML LL (SYRINGE) ×3 IMPLANT
TOWEL OR 17X26 4PK STRL BLUE (TOWEL DISPOSABLE) ×3 IMPLANT

## 2019-03-12 NOTE — Interval H&P Note (Signed)
History and Physical Interval Note:  03/12/2019 12:39 PM  Gloria Allen  has presented today for surgery, with the diagnosis of LUNG CANCER.  The various methods of treatment have been discussed with the patient and family. After consideration of risks, benefits and other options for treatment, the patient has consented to  Procedure(s): INSERTION PORT-A-CATH (N/A) as a surgical intervention.  The patient's history has been reviewed, patient examined, no change in status, stable for surgery.  I have reviewed the patient's chart and labs.  Questions were answered to the patient's satisfaction.     Tatamy

## 2019-03-12 NOTE — Discharge Instructions (Signed)

## 2019-03-12 NOTE — Op Note (Signed)
  Pre-operative Diagnosis:Lung CA  Post-operative Diagnosis: same   Surgeon: Caroleen Hamman, MD FACS  Anesthesia: IV sedation, marcaine .25% w epi and lidocaine 1%  Procedure: right IJ  Port placement with fluoroscopy under U/S guidance  Findings: Good position of the tip of the catheter by fluoroscopy  Estimated Blood Loss: Minimal         Drains: None         Specimens: None       Complications: none         Procedure Details  The patient was seen again in the Holding Room. The benefits, complications, treatment options, and expected outcomes were discussed with the patient. The risks of bleeding, infection, recurrence of symptoms, failure to resolve symptoms,  thrombosis nonfunction breakage pneumothorax hemopneumothorax any of which could require chest tube or further surgery were reviewed with the patient.   The patient was taken to Operating Room, identified as Gloria Allen and the procedure verified.  A Time Out was held and the above information confirmed.  Prior to the induction of general anesthesia, antibiotic prophylaxis was administered. VTE prophylaxis was in place. Appropriate anesthesia was then administered and tolerated well. The chest was prepped with Chloraprep and draped in the sterile fashion. The patient was positioned in the supine position. Then the patient was placed in Trendelenburg position.  Patient was prepped and draped in sterile fashion and in a Trendelenburg position local anesthetic was infiltrated into the skin and subcutaneous tissues in the neck and anterior chest wall. The large bore needle was placed into the internal jugular vein under U/S guidance without difficulty and then the Seldinger wire was advanced. Fluoroscopy was utilized to confirm that the Seldinger wire was in the superior vena cava.  An incision was made and a port pocket developed with blunt and electrocautery dissection. The introducer dilator was placed over the Seldinger wire  the wire was removed. The previously flushed catheter was placed into the introducer dilator and the peel-away sheath was removed. The catheter length was confirmed and trimmed utilizing fluoroscopy for proper positioning. The catheter was then attached to the previously flushed port. The port was placed into the pocket. The port was held in with 2-0 Prolenes and flushed for function and heparin locked.  The wound was closed with interrupted 3-0 Vicryl followed by 4-0 subcuticular Monocryl sutures. Dermabond used to coat the skin  Patient was taken to the recovery room in stable condition where a postoperative chest film has been ordered.

## 2019-03-12 NOTE — Anesthesia Post-op Follow-up Note (Signed)
Anesthesia QCDR form completed.        

## 2019-03-12 NOTE — Transfer of Care (Signed)
Immediate Anesthesia Transfer of Care Note  Patient: Gloria Allen  Procedure(s) Performed: INSERTION PORT-A-CATH (N/A )  Patient Location: PACU  Anesthesia Type:General  Level of Consciousness: awake, alert  and oriented  Airway & Oxygen Therapy: Patient Spontanous Breathing and Patient connected to face mask oxygen  Post-op Assessment: Report given to RN and Post -op Vital signs reviewed and stable  Post vital signs: Reviewed and stable  Last Vitals:  Vitals Value Taken Time  BP 193/91 03/12/19 1353  Temp    Pulse 83 03/12/19 1355  Resp 15 03/12/19 1355  SpO2 96 % 03/12/19 1355  Vitals shown include unvalidated device data.  Last Pain:  Vitals:   03/12/19 1202  TempSrc: Tympanic  PainSc: 0-No pain         Complications: No apparent anesthesia complications

## 2019-03-12 NOTE — Anesthesia Preprocedure Evaluation (Signed)
Anesthesia Evaluation  Patient identified by MRN, date of birth, ID band Patient awake    Reviewed: Allergy & Precautions, NPO status , Patient's Chart, lab work & pertinent test results, reviewed documented beta blocker date and time   Airway Mallampati: III  TM Distance: >3 FB     Dental  (+) Upper Dentures, Lower Dentures   Pulmonary shortness of breath, asthma , former smoker,           Cardiovascular hypertension, Pt. on medications and Pt. on home beta blockers      Neuro/Psych  Headaches, PSYCHIATRIC DISORDERS Anxiety Depression  Neuromuscular disease    GI/Hepatic GERD  Controlled,  Endo/Other    Renal/GU Renal disease     Musculoskeletal  (+) Arthritis ,   Abdominal   Peds  Hematology   Anesthesia Other Findings Lung ca.  Reproductive/Obstetrics                             Anesthesia Physical Anesthesia Plan  ASA: III  Anesthesia Plan: General and MAC   Post-op Pain Management:    Induction: Intravenous  PONV Risk Score and Plan:   Airway Management Planned:   Additional Equipment:   Intra-op Plan:   Post-operative Plan:   Informed Consent: I have reviewed the patients History and Physical, chart, labs and discussed the procedure including the risks, benefits and alternatives for the proposed anesthesia with the patient or authorized representative who has indicated his/her understanding and acceptance.       Plan Discussed with: CRNA  Anesthesia Plan Comments:         Anesthesia Quick Evaluation

## 2019-03-13 ENCOUNTER — Ambulatory Visit
Admission: RE | Admit: 2019-03-13 | Discharge: 2019-03-13 | Disposition: A | Discharge status: Home / Self Care | Payer: Medicare Other | Source: Ambulatory Visit | Attending: Radiation Oncology | Admitting: Radiation Oncology

## 2019-03-13 ENCOUNTER — Encounter: Payer: Self-pay | Admitting: Surgery

## 2019-03-13 DIAGNOSIS — Z9181 History of falling: Secondary | ICD-10-CM | POA: Diagnosis not present

## 2019-03-13 DIAGNOSIS — J40 Bronchitis, not specified as acute or chronic: Secondary | ICD-10-CM | POA: Diagnosis not present

## 2019-03-13 DIAGNOSIS — M80071D Age-related osteoporosis with current pathological fracture, right ankle and foot, subsequent encounter for fracture with routine healing: Secondary | ICD-10-CM | POA: Diagnosis not present

## 2019-03-13 DIAGNOSIS — Z433 Encounter for attention to colostomy: Secondary | ICD-10-CM | POA: Diagnosis not present

## 2019-03-13 DIAGNOSIS — G894 Chronic pain syndrome: Secondary | ICD-10-CM | POA: Diagnosis not present

## 2019-03-13 DIAGNOSIS — C3411 Malignant neoplasm of upper lobe, right bronchus or lung: Secondary | ICD-10-CM | POA: Diagnosis not present

## 2019-03-13 DIAGNOSIS — M4692 Unspecified inflammatory spondylopathy, cervical region: Secondary | ICD-10-CM | POA: Diagnosis not present

## 2019-03-13 DIAGNOSIS — M450 Ankylosing spondylitis of multiple sites in spine: Secondary | ICD-10-CM | POA: Diagnosis not present

## 2019-03-13 DIAGNOSIS — M48061 Spinal stenosis, lumbar region without neurogenic claudication: Secondary | ICD-10-CM | POA: Diagnosis not present

## 2019-03-13 DIAGNOSIS — M80062D Age-related osteoporosis with current pathological fracture, left lower leg, subsequent encounter for fracture with routine healing: Secondary | ICD-10-CM | POA: Diagnosis not present

## 2019-03-13 DIAGNOSIS — M7500 Adhesive capsulitis of unspecified shoulder: Secondary | ICD-10-CM | POA: Diagnosis not present

## 2019-03-13 DIAGNOSIS — Z85038 Personal history of other malignant neoplasm of large intestine: Secondary | ICD-10-CM | POA: Diagnosis not present

## 2019-03-13 DIAGNOSIS — I10 Essential (primary) hypertension: Secondary | ICD-10-CM | POA: Diagnosis not present

## 2019-03-13 DIAGNOSIS — M4802 Spinal stenosis, cervical region: Secondary | ICD-10-CM | POA: Diagnosis not present

## 2019-03-13 DIAGNOSIS — Z7901 Long term (current) use of anticoagulants: Secondary | ICD-10-CM | POA: Diagnosis not present

## 2019-03-13 DIAGNOSIS — K219 Gastro-esophageal reflux disease without esophagitis: Secondary | ICD-10-CM | POA: Diagnosis not present

## 2019-03-13 DIAGNOSIS — Z510 Encounter for antineoplastic radiation therapy: Secondary | ICD-10-CM | POA: Diagnosis not present

## 2019-03-13 DIAGNOSIS — Z87891 Personal history of nicotine dependence: Secondary | ICD-10-CM | POA: Diagnosis not present

## 2019-03-13 DIAGNOSIS — Z87440 Personal history of urinary (tract) infections: Secondary | ICD-10-CM | POA: Diagnosis not present

## 2019-03-13 NOTE — Anesthesia Postprocedure Evaluation (Signed)
Anesthesia Post Note  Patient: [name]  Procedure(s) Performed: INSERTION PORT-A-CATH (N/A )  Patient location during evaluation: PACU Anesthesia Type: MAC Level of consciousness: awake and alert Pain management: pain level controlled Vital Signs Assessment: post-procedure vital signs reviewed and stable Respiratory status: spontaneous breathing, nonlabored ventilation, respiratory function stable and patient connected to nasal cannula oxygen Cardiovascular status: stable and blood pressure returned to baseline Postop Assessment: no apparent nausea or vomiting Anesthetic complications: no     Last Vitals:  Vitals:   03/12/19 1511 03/12/19 1537  BP: (!) 175/64 (!) 178/54  Pulse: (!) 58 72  Resp: 16 16  Temp: 36.9 C   SpO2: 100% 100%    Last Pain:  Vitals:   03/13/19 0802  TempSrc:   PainSc: 0-No pain                 Desaray Marschner S

## 2019-03-16 ENCOUNTER — Other Ambulatory Visit: Payer: Self-pay

## 2019-03-16 ENCOUNTER — Ambulatory Visit
Admission: RE | Admit: 2019-03-16 | Discharge: 2019-03-16 | Disposition: A | Discharge status: Home / Self Care | Payer: Medicare Other | Source: Ambulatory Visit | Attending: Radiation Oncology | Admitting: Radiation Oncology

## 2019-03-16 DIAGNOSIS — Z87891 Personal history of nicotine dependence: Secondary | ICD-10-CM | POA: Diagnosis not present

## 2019-03-16 DIAGNOSIS — C3411 Malignant neoplasm of upper lobe, right bronchus or lung: Secondary | ICD-10-CM | POA: Diagnosis not present

## 2019-03-16 DIAGNOSIS — Z510 Encounter for antineoplastic radiation therapy: Secondary | ICD-10-CM | POA: Diagnosis not present

## 2019-03-17 ENCOUNTER — Encounter: Payer: Self-pay | Admitting: Oncology

## 2019-03-17 ENCOUNTER — Other Ambulatory Visit: Payer: Self-pay | Admitting: *Deleted

## 2019-03-17 ENCOUNTER — Inpatient Hospital Stay: Payer: Medicare Other

## 2019-03-17 ENCOUNTER — Ambulatory Visit
Admission: RE | Admit: 2019-03-17 | Discharge: 2019-03-17 | Disposition: A | Discharge status: Home / Self Care | Payer: Medicare Other | Source: Ambulatory Visit | Attending: Radiation Oncology | Admitting: Radiation Oncology

## 2019-03-17 ENCOUNTER — Encounter: Payer: Self-pay | Admitting: *Deleted

## 2019-03-17 ENCOUNTER — Inpatient Hospital Stay (HOSPITAL_BASED_OUTPATIENT_CLINIC_OR_DEPARTMENT_OTHER): Payer: Medicare Other | Admitting: Oncology

## 2019-03-17 ENCOUNTER — Encounter (INDEPENDENT_AMBULATORY_CARE_PROVIDER_SITE_OTHER): Payer: Self-pay

## 2019-03-17 ENCOUNTER — Other Ambulatory Visit: Payer: Self-pay

## 2019-03-17 ENCOUNTER — Telehealth: Payer: Self-pay | Admitting: Family Medicine

## 2019-03-17 VITALS — BP 179/101 | HR 116 | Temp 96.1°F | Ht 65.0 in | Wt 148.9 lb

## 2019-03-17 VITALS — BP 170/90 | HR 89 | Resp 18

## 2019-03-17 DIAGNOSIS — Z510 Encounter for antineoplastic radiation therapy: Secondary | ICD-10-CM | POA: Diagnosis not present

## 2019-03-17 DIAGNOSIS — I10 Essential (primary) hypertension: Secondary | ICD-10-CM | POA: Diagnosis not present

## 2019-03-17 DIAGNOSIS — R102 Pelvic and perineal pain: Secondary | ICD-10-CM | POA: Diagnosis not present

## 2019-03-17 DIAGNOSIS — Z5189 Encounter for other specified aftercare: Secondary | ICD-10-CM | POA: Diagnosis not present

## 2019-03-17 DIAGNOSIS — C3490 Malignant neoplasm of unspecified part of unspecified bronchus or lung: Secondary | ICD-10-CM

## 2019-03-17 DIAGNOSIS — E876 Hypokalemia: Secondary | ICD-10-CM | POA: Diagnosis not present

## 2019-03-17 DIAGNOSIS — Z5111 Encounter for antineoplastic chemotherapy: Secondary | ICD-10-CM | POA: Diagnosis not present

## 2019-03-17 DIAGNOSIS — Z87891 Personal history of nicotine dependence: Secondary | ICD-10-CM | POA: Diagnosis not present

## 2019-03-17 DIAGNOSIS — D72829 Elevated white blood cell count, unspecified: Secondary | ICD-10-CM | POA: Diagnosis not present

## 2019-03-17 DIAGNOSIS — C3411 Malignant neoplasm of upper lobe, right bronchus or lung: Secondary | ICD-10-CM | POA: Diagnosis not present

## 2019-03-17 DIAGNOSIS — C187 Malignant neoplasm of sigmoid colon: Secondary | ICD-10-CM

## 2019-03-17 DIAGNOSIS — R350 Frequency of micturition: Secondary | ICD-10-CM | POA: Diagnosis not present

## 2019-03-17 DIAGNOSIS — E860 Dehydration: Secondary | ICD-10-CM | POA: Diagnosis not present

## 2019-03-17 LAB — FERRITIN: Ferritin: 68 ng/mL (ref 11–307)

## 2019-03-17 LAB — CBC WITH DIFFERENTIAL/PLATELET
Abs Immature Granulocytes: 0.04 10*3/uL (ref 0.00–0.07)
Basophils Absolute: 0.1 10*3/uL (ref 0.0–0.1)
Basophils Relative: 1 %
Eosinophils Absolute: 0.4 10*3/uL (ref 0.0–0.5)
Eosinophils Relative: 4 %
HCT: 40.1 % (ref 36.0–46.0)
Hemoglobin: 13.1 g/dL (ref 12.0–15.0)
Immature Granulocytes: 0 %
Lymphocytes Relative: 27 %
Lymphs Abs: 2.4 10*3/uL (ref 0.7–4.0)
MCH: 26.9 pg (ref 26.0–34.0)
MCHC: 32.7 g/dL (ref 30.0–36.0)
MCV: 82.3 fL (ref 80.0–100.0)
Monocytes Absolute: 0.5 10*3/uL (ref 0.1–1.0)
Monocytes Relative: 5 %
Neutro Abs: 5.6 10*3/uL (ref 1.7–7.7)
Neutrophils Relative %: 63 %
Platelets: 378 10*3/uL (ref 150–400)
RBC: 4.87 MIL/uL (ref 3.87–5.11)
RDW: 14.7 % (ref 11.5–15.5)
WBC: 9.1 10*3/uL (ref 4.0–10.5)
nRBC: 0 % (ref 0.0–0.2)

## 2019-03-17 LAB — IRON AND TIBC
Iron: 65 ug/dL (ref 28–170)
Saturation Ratios: 16 % (ref 10.4–31.8)
TIBC: 398 ug/dL (ref 250–450)
UIBC: 333 ug/dL

## 2019-03-17 LAB — COMPREHENSIVE METABOLIC PANEL
ALT: 21 U/L (ref 0–44)
AST: 23 U/L (ref 15–41)
Albumin: 4.5 g/dL (ref 3.5–5.0)
Alkaline Phosphatase: 111 U/L (ref 38–126)
Anion gap: 12 (ref 5–15)
BUN: 13 mg/dL (ref 8–23)
CO2: 21 mmol/L — ABNORMAL LOW (ref 22–32)
Calcium: 10.1 mg/dL (ref 8.9–10.3)
Chloride: 106 mmol/L (ref 98–111)
Creatinine, Ser: 0.78 mg/dL (ref 0.44–1.00)
GFR calc Af Amer: >60 mL/min (ref >60)
GFR calc non Af Amer: >60 mL/min (ref >60)
Glucose, Bld: 135 mg/dL — ABNORMAL HIGH (ref 70–99)
Potassium: 3.6 mmol/L (ref 3.5–5.1)
Sodium: 139 mmol/L (ref 135–145)
Total Bilirubin: 0.3 mg/dL (ref 0.3–1.2)
Total Protein: 8.4 g/dL — ABNORMAL HIGH (ref 6.5–8.1)

## 2019-03-17 MED ORDER — OLANZAPINE 10 MG PO TABS: 10.0000 mg | ORAL_TABLET | Freq: Every day | ORAL | 1 refills | Status: DC

## 2019-03-17 MED ORDER — SODIUM CHLORIDE 0.9 % IV SOLN: 10.0000 mg | Freq: Once | INTRAVENOUS | Status: DC

## 2019-03-17 MED ORDER — HEPARIN SOD (PORK) LOCK FLUSH 100 UNIT/ML IV SOLN
500.0000 [IU] | Freq: Once | INTRAVENOUS | Status: AC
  Administered 2019-03-17: 500 [IU] via INTRAVENOUS
  Filled 2019-03-17: qty 5

## 2019-03-17 MED ORDER — SODIUM CHLORIDE 0.9 % IV SOLN
Freq: Once | INTRAVENOUS | Status: AC
  Administered 2019-03-17: 10:00:00 via INTRAVENOUS
  Filled 2019-03-17: qty 250

## 2019-03-17 MED ORDER — SODIUM CHLORIDE 0.9 % IV SOLN
404.0000 mg | Freq: Once | INTRAVENOUS | Status: AC
  Administered 2019-03-17: 12:00:00 400 mg via INTRAVENOUS
  Filled 2019-03-17: qty 40

## 2019-03-17 MED ORDER — DEXAMETHASONE SODIUM PHOSPHATE 10 MG/ML IJ SOLN
10.0000 mg | Freq: Once | INTRAMUSCULAR | Status: AC
  Administered 2019-03-17: 10 mg via INTRAVENOUS
  Filled 2019-03-17: qty 1

## 2019-03-17 MED ORDER — SODIUM CHLORIDE 0.9% FLUSH
10.0000 mL | Freq: Once | INTRAVENOUS | Status: AC
  Administered 2019-03-17: 10 mL via INTRAVENOUS
  Filled 2019-03-17: qty 10

## 2019-03-17 MED ORDER — SODIUM CHLORIDE 0.9 % IV SOLN
100.0000 mg/m2 | Freq: Once | INTRAVENOUS | Status: AC
  Administered 2019-03-17: 180 mg via INTRAVENOUS
  Filled 2019-03-17: qty 9

## 2019-03-17 MED ORDER — PALONOSETRON HCL INJECTION 0.25 MG/5ML
0.2500 mg | Freq: Once | INTRAVENOUS | Status: AC
  Administered 2019-03-17: 0.25 mg via INTRAVENOUS
  Filled 2019-03-17: qty 5

## 2019-03-17 NOTE — Progress Notes (Signed)
  Oncology Nurse Navigator Documentation  Navigator Location: [facility]Med Onc (03/17/19 1000)   )Navigator Encounter Type: Treatment (03/17/19 1000)                   Treatment Initiated Date: 03/11/19 (03/17/19 1000) Patient Visit Type: MedOnc (03/17/19 1000) Treatment Phase: First Chemo Tx (03/17/19 1000) Barriers/Navigation Needs: No Barriers At This Time (03/17/19 1000)   Interventions: None Required (03/17/19 1000)             Met with patient prior to receiving first chemo treatment. All questions answered during visit. Reviewed upcoming appts with patient. Instructed to call with any further questions or needs. Pt verbalized understanding. Nothing further needed at this time.          Time Spent with Patient: 30 (03/17/19 1000)

## 2019-03-17 NOTE — Progress Notes (Signed)
Per Dr. Janese Banks, recheck vitals and proceed with treatment

## 2019-03-17 NOTE — Telephone Encounter (Signed)
Patient told cancer center she called last week for an appointment and no one called her back. I do not see anything documented. She needs refills and has not been seen in almost a year. Please schedule patient for in-office visit. Thanks.

## 2019-03-17 NOTE — Progress Notes (Signed)
zyp

## 2019-03-17 NOTE — Progress Notes (Signed)
Patient is here to start her first chemotherapy. Patient is requesting a refill on all of her medications.

## 2019-03-17 NOTE — Telephone Encounter (Signed)
LVM for pt to call back.

## 2019-03-17 NOTE — Progress Notes (Signed)
Hematology/Oncology Consult note [facility]  [phone] [phone]  Patient Care Team: Inc, [facility] as PCP - General Telford Nab, [place] as Registered Nurse   Name of the patient: Gloria Allen  [id number]  10-Feb-1946   Date of visit: 03/17/19  Diagnosis- 1.  Stage III colon cancer status post surgery 2.  New diagnosis of limited stage small cell lung cancer  Chief complaint/ Reason for visit-on treatment assessment prior to cycle 1 of carboplatin and etoposide chemotherapy  Heme/Onc history: patient is a 73 year old African-American female who recently underwent screening colonoscopy by Dr. Bonna Gains.Colonoscopy showed villous nonobstructing large mass in the sigmoid colon 12 cm proximal to the anus. Mass was partially circumferential. Biopsy showed invasive adenocarcinoma. Patient was seen by Dr. Dahlia Byes and underwent hemicolectomy with lymph node sampling on 11/27/2018. Final pathology showed invasive colorectal carcinoma 3.5 cm, grade 2. Tumor invades through muscularis propria into the peri-colorectal tissue. Margins negative. Tumor deposits present, 2. 1 out of 9 lymph nodes examined was positive for malignancy. [id number].  Patient was feeling poorly after surgery and she had no social support and decided against adjuvant chemotherapy.  Patient underwent CT chest to complete her staging work-up which showed 2 lung nodules one in the right upper lobe and one in the left left upper lobe.  This was followed by a PET CT scan which showed with the left upper lobe nodule was not hypermetabolic.The right upper lobe lung nodule measure 1.6 cm with an SUV of 6.8.  No hypermetabolic axillary mediastinal or hilar lymphadenopathy or evidence of metastatic disease elsewhere.  Patient underwent CT-guided biopsy of the right upper lobe lung nodule which was consistent with small cell lung cancer positive for CD56 and TTF-1  Interval  history-she is nervous about starting chemotherapy today but is otherwise doing well and denies any complaints at this time  ECOG PS- 1 Pain scale- 0   Review of systems- Review of Systems  Constitutional: Positive for malaise/fatigue. Negative for chills, fever and weight loss.  HENT: Negative for congestion, ear discharge and nosebleeds.   Eyes: Negative for blurred vision.  Respiratory: Negative for cough, hemoptysis, sputum production, shortness of breath and wheezing.   Cardiovascular: Negative for chest pain, palpitations, orthopnea and claudication.  Gastrointestinal: Negative for abdominal pain, blood in stool, constipation, diarrhea, heartburn, melena, nausea and vomiting.  Genitourinary: Negative for dysuria, flank pain, frequency, hematuria and urgency.  Musculoskeletal: Negative for back pain, joint pain and myalgias.  Skin: Negative for rash.  Neurological: Negative for dizziness, tingling, focal weakness, seizures, weakness and headaches.  Endo/Heme/Allergies: Does not bruise/bleed easily.  Psychiatric/Behavioral: Negative for depression and suicidal ideas. The patient does not have insomnia.        Allergies  Allergen Reactions  . Ace Inhibitors Swelling  . Angiotensin Receptor Blockers Swelling  . Sulfa Antibiotics Itching  . Chlorthalidone Rash  . Flexeril [Cyclobenzaprine] Rash     Past Medical History:  Diagnosis Date  . Anxiety   . Arthritis   . Asthma   . Cancer ([place])   . Cervical central spinal stenosis (C3-C7) (worse at C4-5) 07/30/2017  . Cervical foraminal stenosis (C4-5 and C5-6) (Bilateral) (L>R) 07/30/2017  . Chronic lower extremity pain (Fourth Area of Pain) (Bilateral) (R>L) 06/11/2017  . Chronic neck pain (Primary Area of Pain) (Bilateral) (R>L) 06/11/2017  . Chronic sacroiliac joint pain (Bilateral) (R>L) 06/11/2017  . Chronic shoulder pain [facility] Area of Pain) (Right) 06/11/2017  . Chronic upper extremity pain (  Fifth Area of Pain)  (Bilateral) (R>L) 06/11/2017  . Colon cancer ([place])   . DDD (degenerative disc disease), cervical 07/30/2017  . DDD (degenerative disc disease), lumbar 07/30/2017  . Depression   . DISH (diffuse idiopathic skeletal hyperostosis) 07/30/2017  . Dyspnea    with exertion  . Entrapment syndrome 06/20/2017   2002 on R and 2010 on L  . Full thickness rotator cuff tear 06/12/2017  . GERD (gastroesophageal reflux disease)   . Grade 1 Anterolisthesis of L4 over L5 07/30/2017  . Headache   . Hx: UTI (urinary tract infection)   . Hypertension   . Inflammation of joint of shoulder region 01/15/2017  . Lumbar central spinal stenosis (L4-5) 07/30/2017  . Lumbar disc protrusion (Left: L5-S1) (Right: L1-2) 07/30/2017   L5-S1 left foraminal protrusion with L5 impingement. L1-2 right paracentral protrusion without impingement.  . Lumbar facet arthropathy (Bilateral) 07/30/2017  . Lumbar facet syndrome (Bilateral) (R>L) 07/30/2017  . Lung cancer ([place])   . Osteoarthritis of shoulder (Right) 01/15/2017     Past Surgical History:  Procedure Laterality Date  . ABDOMINAL HYSTERECTOMY    . APPENDECTOMY    . BREAST REDUCTION SURGERY Bilateral   . COLONOSCOPY WITH PROPOFOL N/A 11/05/2018   Procedure: COLONOSCOPY WITH PROPOFOL;  Surgeon: Virgel Manifold, MD;  Location: [facility] ENDOSCOPY;  Service: Endoscopy;  Laterality: N/A;  . COLONOSCOPY WITH PROPOFOL N/A 11/06/2018   Procedure: COLONOSCOPY WITH PROPOFOL;  Surgeon: Virgel Manifold, MD;  Location: [facility] ENDOSCOPY;  Service: Endoscopy;  Laterality: N/A;  . COLOSTOMY    . DILATION AND CURETTAGE OF UTERUS    . ESOPHAGOGASTRODUODENOSCOPY (EGD) WITH PROPOFOL N/A 11/05/2018   Procedure: ESOPHAGOGASTRODUODENOSCOPY (EGD) WITH PROPOFOL;  Surgeon: Virgel Manifold, MD;  Location: [facility] ENDOSCOPY;  Service: Endoscopy;  Laterality: N/A;  . ILEOSTOMY Right 11/27/2018   Procedure: ILEOSTOMY;  Surgeon: Jules Husbands, MD;  Location: [facility] ORS;  Service: General;   Laterality: Right;  . LAPAROSCOPIC SIGMOID COLECTOMY N/A 11/27/2018   Procedure: LAPAROSCOPIC SIGMOID COLECTOMY;  Surgeon: Jules Husbands, MD;  Location: [facility] ORS;  Service: General;  Laterality: N/A;  . ORIF ANKLE FRACTURE Right 11/21/2018   Procedure: OPEN REDUCTION INTERNAL FIXATION (ORIF) ANKLE FRACTURE;  Surgeon: Corky Mull, MD;  Location: [facility] OR[facility];  Service: Orthopedics;  Laterality: Right;  . PORTACATH PLACEMENT N/A 03/12/2019   Procedure: INSERTION PORT-A-CATH;  Surgeon: Jules Husbands, MD;  Location: [facility] ORS;  Service: General;  Laterality: N/A;  . REDUCTION MAMMAPLASTY    . SHOULDER ARTHROSCOPY WITH OPEN ROTATOR CUFF REPAIR AND DISTAL CLAVICLE ACROMINECTOMY Right 03/26/2017   Procedure: right shoulder arthroscopy, arthroscopic subacromial decompression, distal clavicle excision, mini open rotator cuff repair;  Surgeon: Thornton Park, MD;  Location: [facility] ORS;  Service: Orthopedics;  Laterality: Right;    Social History   Socioeconomic History  . Marital status: Divorced    Spouse name: Not on file  . Number of children: 3  . Years of education: Not on file  . Highest education level: 11th grade  Occupational History  . Not on file  Social Needs  . Financial resource strain: Somewhat hard  . Food insecurity    Worry: Sometimes true    Inability: Never true  . Transportation needs    Medical: Yes    Non-medical: No  Tobacco Use  . Smoking status: Former Smoker    Packs/day: 1.00    Types: Cigarettes    Quit date: 2010    Years since quitting: 10.4  .  Smokeless tobacco: Never Used  . Tobacco comment: quit over 30 years ago   Substance and Sexual Activity  . Alcohol use: No  . Drug use: Yes    Types: Marijuana  . Sexual activity: Never  Lifestyle  . Physical activity    Days per week: 0 days    Minutes per session: 0 min  . Stress: Not at all  Relationships  . Social Herbalist on phone: Never    Gets together: Never    Attends religious service:  More than 4 times per year    Active member of club or organization: No    Attends meetings of clubs or organizations: Never    Relationship status: Divorced  . Intimate partner violence    Fear of current or ex partner: No    Emotionally abused: No    Physically abused: No    Forced sexual activity: No  Other Topics Concern  . Not on file  Social History Narrative  . Not on file    Family History  Problem Relation Age of Onset  . Hypertension Mother   . Breast cancer Mother 73  . Breast cancer Sister 21     Current Outpatient Medications:  .  amLODipine (NORVASC) 10 MG tablet, Take 1 tablet by mouth 1 day or 1 dose., Disp: , Rfl:  .  clonazePAM (KLONOPIN) 0.5 MG tablet, Take 0.5 mg by mouth 3 (three) times daily. , Disp: , Rfl:  .  cloNIDine (CATAPRES) 0.3 MG tablet, Take 2 tablets (0.6 mg total) by mouth 2 (two) times daily. (Patient taking differently: Take 0.3 mg by mouth 2 (two) times daily. ), Disp: 120 tablet, Rfl: 2 .  hydrALAZINE (APRESOLINE) 100 MG tablet, Take 1 tablet (100 mg total) by mouth 3 (three) times daily. (Patient taking differently: Take 100 mg by mouth daily. ), Disp: 90 tablet, Rfl: 1 .  hydrOXYzine (ATARAX/VISTARIL) 10 MG tablet, TAKE 1 TABLET BY MOUTH THREE TIMES A DAY AS NEEDED (Patient taking differently: Take 10 mg by mouth 3 (three) times daily as needed for itching. ), Disp: 90 tablet, Rfl: 0 .  lidocaine-prilocaine (EMLA) cream, Apply to affected area once, Disp: 30 g, Rfl: 3 .  loperamide (IMODIUM) 2 MG capsule, Take 2 capsules (4 mg total) by mouth 3 (three) times daily., Disp: 60 capsule, Rfl: 2 .  LORazepam (ATIVAN) 0.5 MG tablet, Take 1 tablet (0.5 mg total) by mouth every 8 (eight) hours as needed for anxiety., Disp: 90 tablet, Rfl: 0 .  lovastatin (MEVACOR) 40 MG tablet, Take 1 tablet (40 mg total) by mouth at bedtime., Disp: 90 tablet, Rfl: 1 .  meclizine (ANTIVERT) 12.5 MG tablet, Take 12.5 mg by mouth 3 (three) times daily as needed for  dizziness. , Disp: , Rfl: 0 .  megestrol (MEGACE) 400 MG/10ML suspension, Take 10 mLs (400 mg total) by mouth daily for 30 doses., Disp: 300 mL, Rfl: 2 .  methocarbamol (ROBAXIN) 500 MG tablet, Take 1 tablet (500 mg total) by mouth at bedtime as needed for muscle spasms., Disp: 30 tablet, Rfl: 0 .  metoprolol succinate (TOPROL-XL) 100 MG 24 hr tablet, TAKE 1 TABLET BY MOUTH DAILY, Disp: 30 tablet, Rfl: 0 .  mirtazapine (REMERON) 15 MG tablet, take 1 tablet by mouth at bedtime for APPETITIE (Patient taking differently: Take 15 mg by mouth at bedtime. take 1 tablet by mouth at bedtime for APPETITIE), Disp: 180 tablet, Rfl: 0 .  montelukast (SINGULAIR) 10 MG tablet,  Take 1 tablet (10 mg total) by mouth at bedtime as needed., Disp: 90 tablet, Rfl: 1 .  omeprazole (PRILOSEC) 40 MG capsule, Take 1 capsule (40 mg total) by mouth 2 (two) times daily., Disp: 60 capsule, Rfl: 2 .  ondansetron (ZOFRAN) 8 MG tablet, Take 1 tablet (8 mg total) by mouth 2 (two) times daily as needed for refractory nausea / vomiting. Start on day 3 after carboplatin chemo., Disp: 30 tablet, Rfl: 1 .  oxyCODONE-acetaminophen (PERCOCET/ROXICET) 5-325 MG tablet, Take 1 tablet by mouth every 6 (six) hours as needed for severe pain., Disp: 20 tablet, Rfl: 0 .  Potassium 99 MG TABS, Take 1 tablet by mouth daily as needed (cramps). , Disp: , Rfl:  .  prochlorperazine (COMPAZINE) 10 MG tablet, Take 1 tablet (10 mg total) by mouth every 6 (six) hours as needed (Nausea or vomiting)., Disp: 30 tablet, Rfl: 1 .  sertraline (ZOLOFT) 100 MG tablet, Take 2 tablets (200 mg total) by mouth at bedtime., Disp: 60 tablet, Rfl: 3 .  triamcinolone cream (KENALOG) 0.1 %, APPLY TOPICALLY TWICE DAILY AS NEEDED FOR ITCHING (Patient taking differently: Apply 1 application topically 2 (two) times daily as needed (itching). ), Disp: 30 g, Rfl: 0 .  albuterol (PROVENTIL HFA;VENTOLIN HFA) 108 (90 Base) MCG/ACT inhaler, Inhale 2 puffs into the lungs every 6 (six)  hours as needed for wheezing or shortness of breath. (Patient not taking: Reported on 03/17/2019), Disp: 1 Inhaler, Rfl: 3 .  ANORO ELLIPTA 62.5-25 MCG/INH AEPB, INHALE 1 PUFF BY MOUTH DAILY (Patient not taking: Reported on 03/17/2019), Disp: 60 each, Rfl: 0 .  BREO ELLIPTA 200-25 MCG/INH AEPB, Inhale 1 puff into the lungs daily. , Disp: , Rfl:  .  HYDROcodone-acetaminophen (NORCO/VICODIN) 5-325 MG tablet, Take 1 tablet by mouth every 6 (six) hours as needed for moderate pain. (Patient not taking: Reported on 03/17/2019), Disp: 8 tablet, Rfl: 0 .  oxybutynin (DITROPAN) 5 MG tablet, TAKE 1 TABLET BY MOUTH TWICE DAILY (Patient not taking: No sig reported), Disp: 180 tablet, Rfl: 0 .  terazosin (HYTRIN) 5 MG capsule, TAKE 1 CAPSULE BY MOUTH ONCE DAILY AT BEDTIME (Patient not taking: Reported on 03/17/2019), Disp: 30 capsule, Rfl: 0 .  traZODone (DESYREL) 50 MG tablet, Take 0.5-1 tablets (25-50 mg total) by mouth at bedtime as needed for sleep. (Patient not taking: Reported on 03/17/2019), Disp: 30 tablet, Rfl: 3 No current facility-administered medications for this visit.   Facility-Administered Medications Ordered in Other Visits:  .  CARBOplatin (PARAPLATIN) 400 mg in sodium chloride 0.9 % 250 mL chemo infusion, 400 mg, Intravenous, Once, Sindy Guadeloupe, MD .  dexamethasone (DECADRON) injection 10 mg, 10 mg, Intravenous, Once, Sindy Guadeloupe, MD .  etoposide (VEPESID) 180 mg in sodium chloride 0.9 % 500 mL chemo infusion, 100 mg/m2 (Treatment Plan Recorded), Intravenous, Once, Sindy Guadeloupe, MD .  heparin lock flush 100 unit/mL, 500 Units, Intravenous, Once, Sindy Guadeloupe, MD .  palonosetron (ALOXI) injection 0.25 mg, 0.25 mg, Intravenous, Once, Sindy Guadeloupe, MD  Physical exam:  Vitals:   03/17/19 0916  BP: (!) 179/101  Pulse: (!) 116  Temp: (!) 96.1 F (35.6 C)  TempSrc: Tympanic  Weight: 148 lb 14.4 oz (67.5 kg)  Height: 5\' 5"  (1.651 m)   Physical Exam HENT:     Head: Normocephalic and  atraumatic.  Eyes:     Pupils: Pupils are equal, round, and reactive to light.  Neck:     Musculoskeletal: Normal range  of motion.  Cardiovascular:     Rate and Rhythm: Normal rate and regular rhythm.     Heart sounds: Normal heart sounds.  Pulmonary:     Effort: Pulmonary effort is normal.     Breath sounds: Normal breath sounds.  Abdominal:     General: Bowel sounds are normal.     Palpations: Abdomen is soft.  Skin:    General: Skin is warm and dry.  Neurological:     Mental Status: She is alert and oriented to person, place, and time.      CMP Latest Ref Rng & Units 03/17/2019  Glucose 70 - 99 mg/dL 135(H)  BUN 8 - 23 mg/dL 13  Creatinine 0.44 - 1.00 mg/dL 0.78  Sodium 135 - 145 mmol/L 139  Potassium 3.5 - 5.1 mmol/L 3.6  Chloride 98 - 111 mmol/L 106  CO2 22 - 32 mmol/L 21(L)  Calcium 8.9 - 10.3 mg/dL 10.1  Total Protein 6.5 - 8.1 g/dL 8.4(H)  Total Bilirubin 0.3 - 1.2 mg/dL 0.3  Alkaline Phos 38 - 126 U/L 111  AST 15 - 41 U/L 23  ALT 0 - 44 U/L 21   CBC Latest Ref Rng & Units 03/17/2019  WBC 4.0 - 10.5 K/uL 9.1  Hemoglobin 12.0 - 15.0 g/dL 13.1  Hematocrit 36.0 - 46.0 % 40.1  Platelets 150 - 400 K/uL 378    No images are attached to the encounter.  Mr Jeri Cos Wo Contrast  Result Date: 02/26/2019 CLINICAL DATA:  History of lung and colon cancer.  Dizziness. EXAM: MRI HEAD WITHOUT AND WITH CONTRAST TECHNIQUE: Multiplanar, multiecho pulse sequences of the brain and surrounding structures were obtained without and with intravenous contrast. CONTRAST:  6 cc Gadavist COMPARISON:  01/31/2017 FINDINGS: Brain: Numerous old small infarctions in the inferior cerebellum on the left. No brainstem abnormality is seen. Cerebral hemispheres elsewhere show moderate chronic small-vessel change of the deep and subcortical white matter. No cortical or large vessel territory infarction. No mass lesion, hemorrhage, hydrocephalus or extra-axial collection. No abnormal contrast enhancement  occurs. Vascular: Major vessels at the base of the brain show flow. Skull and upper cervical spine: Negative Sinuses/Orbits: Clear/normal Other: None IMPRESSION: No acute or reversible finding. Chronic small-vessel ischemic changes of the cerebral hemispheric white matter. Numerous old small vessel infarctions at the inferior cerebellum on the left. No evidence of metastatic disease. Electronically Signed   By: Nelson Chimes M.D.   On: 02/26/2019 15:15   Dg Chest Port 1 View  Result Date: 03/12/2019 CLINICAL DATA:  Port-A-Cath placement. EXAM: PORTABLE CHEST 1 VIEW COMPARISON:  Chest x-ray 02/12/2019 FINDINGS: Right IJ power port tip is at the level of the carina in the mid SVC. No complicating features. The cardiac silhouette, mediastinal and hilar contours are stable. The lungs are clear. No pleural effusion. IMPRESSION: Right IJ power port in good position with the tip in the mid SVC. No complicating features. Electronically Signed   By: Marijo Sanes M.D.   On: 03/12/2019 14:26   Dg Be (colon)w Single Cm (sol Or Thin Ba)  Result Date: 02/25/2019 CLINICAL DATA:  Rectal pain, s/p sigmoid resection with reanastomosis and diverting ileostomy EXAM: BE LIMITED WITH CONTRAST CONTRAST:  Single-contrast barium FLUOROSCOPY TIME:  Fluoroscopy Time:  1:00 Number of Acquired Spot Images: 8 COMPARISON:  PET-CT, 02/05/2019 FINDINGS: Following rectal cannulation and precontrast spot images, barium contrast was administered through the rectal tube under gentle gravitational pressure to the limit of patient tolerance and catheter leakage. The  rectum is contrast opacified to the sigmorectal anastomosis and just proximal to the visualized sutures, but does not pass further. No evidence of leak or fistula. IMPRESSION: The rectum is contrast opacified to the sigmorectal anastomosis and just proximal to the visualized sutures, but does not pass further. There may be stenosis or adhesion in the vicinity of the anastomosis. No  evidence of leak or fistula. Electronically Signed   By: Eddie Candle M.D.   On: 02/25/2019 11:01   Dg C-arm 1-60 Min-no Report  Result Date: 03/12/2019 Fluoroscopy was utilized by the requesting physician.  No radiographic interpretation.     Assessment and plan- Patient is a 73 y.o. female with history of stage III colon cancer under observation and newly diagnosed small cell lung cancer stage I.  She is here for on treatment assessment prior to cycle 1 of carboplatin and etoposide  Counts okay to proceed with cycle 1 of carboplatin and etoposide today.  She will be receiving etoposide tomorrow and day after.  Carboplatin will be given at AUC 5.  She will not be receiving Neulasta as she has started concurrent radiation today.  I will see her back in 10 days for possible IV fluids.  She has PRN nausea medications.  She knows to keep up with her oral and fluid intake.  She will call us if she has any questions or concerns.  If she tolerates this cycle of chemotherapy well I will plan to switch her to cisplatin with cycle 2.   Visit Diagnosis 1. Encounter for antineoplastic chemotherapy   2. Small cell lung cancer ([place])      Dr. Randa Evens, MD, MPH [facility] [phone] 03/17/2019 10:21 AM

## 2019-03-18 ENCOUNTER — Other Ambulatory Visit: Payer: Self-pay

## 2019-03-18 ENCOUNTER — Inpatient Hospital Stay: Payer: Medicare Other

## 2019-03-18 ENCOUNTER — Ambulatory Visit
Admission: RE | Admit: 2019-03-18 | Discharge: 2019-03-18 | Disposition: A | Discharge status: Home / Self Care | Payer: Medicare Other | Source: Ambulatory Visit | Attending: Radiation Oncology | Admitting: Radiation Oncology

## 2019-03-18 VITALS — BP 126/74 | HR 80 | Resp 20

## 2019-03-18 DIAGNOSIS — Z510 Encounter for antineoplastic radiation therapy: Secondary | ICD-10-CM | POA: Diagnosis not present

## 2019-03-18 DIAGNOSIS — C3411 Malignant neoplasm of upper lobe, right bronchus or lung: Secondary | ICD-10-CM | POA: Diagnosis not present

## 2019-03-18 DIAGNOSIS — Z5111 Encounter for antineoplastic chemotherapy: Secondary | ICD-10-CM | POA: Diagnosis not present

## 2019-03-18 DIAGNOSIS — I10 Essential (primary) hypertension: Secondary | ICD-10-CM | POA: Diagnosis not present

## 2019-03-18 DIAGNOSIS — E876 Hypokalemia: Secondary | ICD-10-CM | POA: Diagnosis not present

## 2019-03-18 DIAGNOSIS — C187 Malignant neoplasm of sigmoid colon: Secondary | ICD-10-CM

## 2019-03-18 DIAGNOSIS — C3490 Malignant neoplasm of unspecified part of unspecified bronchus or lung: Secondary | ICD-10-CM

## 2019-03-18 DIAGNOSIS — R350 Frequency of micturition: Secondary | ICD-10-CM | POA: Diagnosis not present

## 2019-03-18 DIAGNOSIS — Z5189 Encounter for other specified aftercare: Secondary | ICD-10-CM | POA: Diagnosis not present

## 2019-03-18 DIAGNOSIS — D72829 Elevated white blood cell count, unspecified: Secondary | ICD-10-CM | POA: Diagnosis not present

## 2019-03-18 DIAGNOSIS — E860 Dehydration: Secondary | ICD-10-CM | POA: Diagnosis not present

## 2019-03-18 DIAGNOSIS — Z87891 Personal history of nicotine dependence: Secondary | ICD-10-CM | POA: Diagnosis not present

## 2019-03-18 LAB — CEA: CEA: 3 ng/mL (ref 0.0–4.7)

## 2019-03-18 MED ORDER — SODIUM CHLORIDE 0.9 % IV SOLN
100.0000 mg/m2 | Freq: Once | INTRAVENOUS | Status: AC
  Administered 2019-03-18: 11:00:00 180 mg via INTRAVENOUS
  Filled 2019-03-18: qty 9

## 2019-03-18 MED ORDER — HEPARIN SOD (PORK) LOCK FLUSH 100 UNIT/ML IV SOLN
500.0000 [IU] | Freq: Once | INTRAVENOUS | Status: AC | PRN
  Administered 2019-03-18: 13:00:00 500 [IU]
  Filled 2019-03-18: qty 5

## 2019-03-18 MED ORDER — SODIUM CHLORIDE 0.9 % IV SOLN
Freq: Once | INTRAVENOUS | Status: AC
  Administered 2019-03-18: 11:00:00 via INTRAVENOUS
  Filled 2019-03-18: qty 250

## 2019-03-18 MED ORDER — PROCHLORPERAZINE EDISYLATE 10 MG/2ML IJ SOLN
10.0000 mg | Freq: Once | INTRAMUSCULAR | Status: AC
  Administered 2019-03-18: 11:00:00 10 mg via INTRAVENOUS
  Filled 2019-03-18: qty 2

## 2019-03-18 MED ORDER — SODIUM CHLORIDE 0.9 % IV SOLN: 10.0000 mg | Freq: Once | INTRAVENOUS | Status: DC

## 2019-03-18 MED ORDER — DEXAMETHASONE SODIUM PHOSPHATE 10 MG/ML IJ SOLN
10.0000 mg | Freq: Once | INTRAMUSCULAR | Status: AC
  Administered 2019-03-18: 10 mg via INTRAVENOUS
  Filled 2019-03-18: qty 1

## 2019-03-18 NOTE — Progress Notes (Signed)
MD made aware that pt states that she has been dry heaving and feeling nausea since last night after her first treatment on 03/17/2019. Verbal order to place compazine 10mg  IV one time. Pt educated on the importance of taking the prescribed nausea medication on the onset of nausea. Pt verbalized understanding.   Richrd Kuzniar [employer]

## 2019-03-19 ENCOUNTER — Ambulatory Visit
Admission: RE | Admit: 2019-03-19 | Discharge: 2019-03-19 | Disposition: A | Discharge status: Home / Self Care | Payer: Medicare Other | Source: Ambulatory Visit | Attending: Radiation Oncology | Admitting: Radiation Oncology

## 2019-03-19 ENCOUNTER — Inpatient Hospital Stay: Payer: Medicare Other

## 2019-03-19 ENCOUNTER — Other Ambulatory Visit: Payer: Self-pay

## 2019-03-19 ENCOUNTER — Inpatient Hospital Stay: Payer: Medicare Other | Admitting: Nurse Practitioner

## 2019-03-19 VITALS — BP 180/80 | HR 82 | Temp 96.9°F | Resp 18

## 2019-03-19 DIAGNOSIS — Z5111 Encounter for antineoplastic chemotherapy: Secondary | ICD-10-CM | POA: Diagnosis not present

## 2019-03-19 DIAGNOSIS — R350 Frequency of micturition: Secondary | ICD-10-CM | POA: Diagnosis not present

## 2019-03-19 DIAGNOSIS — Z510 Encounter for antineoplastic radiation therapy: Secondary | ICD-10-CM | POA: Diagnosis not present

## 2019-03-19 DIAGNOSIS — D72829 Elevated white blood cell count, unspecified: Secondary | ICD-10-CM | POA: Diagnosis not present

## 2019-03-19 DIAGNOSIS — C187 Malignant neoplasm of sigmoid colon: Secondary | ICD-10-CM | POA: Diagnosis not present

## 2019-03-19 DIAGNOSIS — Z87891 Personal history of nicotine dependence: Secondary | ICD-10-CM | POA: Diagnosis not present

## 2019-03-19 DIAGNOSIS — I10 Essential (primary) hypertension: Secondary | ICD-10-CM | POA: Diagnosis not present

## 2019-03-19 DIAGNOSIS — E860 Dehydration: Secondary | ICD-10-CM | POA: Diagnosis not present

## 2019-03-19 DIAGNOSIS — Z5189 Encounter for other specified aftercare: Secondary | ICD-10-CM | POA: Diagnosis not present

## 2019-03-19 DIAGNOSIS — C3490 Malignant neoplasm of unspecified part of unspecified bronchus or lung: Secondary | ICD-10-CM

## 2019-03-19 DIAGNOSIS — E876 Hypokalemia: Secondary | ICD-10-CM | POA: Diagnosis not present

## 2019-03-19 DIAGNOSIS — C3411 Malignant neoplasm of upper lobe, right bronchus or lung: Secondary | ICD-10-CM | POA: Diagnosis not present

## 2019-03-19 MED ORDER — SODIUM CHLORIDE 0.9 % IV SOLN
100.0000 mg/m2 | Freq: Once | INTRAVENOUS | Status: AC
  Administered 2019-03-19: 180 mg via INTRAVENOUS
  Filled 2019-03-19: qty 9

## 2019-03-19 MED ORDER — SODIUM CHLORIDE 0.9 % IV SOLN
Freq: Once | INTRAVENOUS | Status: AC
  Administered 2019-03-19: 10:00:00 via INTRAVENOUS
  Filled 2019-03-19: qty 250

## 2019-03-19 MED ORDER — HEPARIN SOD (PORK) LOCK FLUSH 100 UNIT/ML IV SOLN
500.0000 [IU] | Freq: Once | INTRAVENOUS | Status: AC | PRN
  Administered 2019-03-19: 500 [IU]
  Filled 2019-03-19: qty 5

## 2019-03-19 MED ORDER — PEGFILGRASTIM 6 MG/0.6ML ~~LOC~~ PSKT
6.0000 mg | PREFILLED_SYRINGE | Freq: Once | SUBCUTANEOUS | Status: AC
  Administered 2019-03-19: 6 mg via SUBCUTANEOUS
  Filled 2019-03-19: qty 0.6

## 2019-03-19 MED ORDER — SODIUM CHLORIDE 0.9 % IV SOLN: 10.0000 mg | Freq: Once | INTRAVENOUS | Status: DC

## 2019-03-19 MED ORDER — DEXAMETHASONE SODIUM PHOSPHATE 10 MG/ML IJ SOLN
10.0000 mg | Freq: Once | INTRAMUSCULAR | Status: AC
  Administered 2019-03-19: 10 mg via INTRAVENOUS
  Filled 2019-03-19: qty 1

## 2019-03-19 NOTE — Progress Notes (Signed)
[place]  [phone] [phone]  Patient Care Team: Inc, [facility] as PCP - General Telford Nab, RN as Registered Nurse   Name of the patient: Gloria Allen  [id number]  10-13-1945   Date of visit: 03/19/19  Diagnosis- stage III colon cancer under observation and newly diagnosed stage I small cell lung cancer  Chief complaint/Reason for visit- Initial Meeting for [facility]  Heme/Onc history:  Oncology History Overview Note  Patient initially presented a 73 year old African-American female who underwent screening colonoscopy by Dr. Bonna Gains.  Colonoscopy showed villous nonobstructing large mass in the sigmoid colon 12 cm proximal to the anus.  This was partially circumferential.  Biopsy showed invasive adenocarcinoma.  Patient was seen by Dr. Dahlia Byes and underwent hemicolectomy with lymph node sampling on 11/27/2018.  Final pathology showed invasive colorectal carcinoma 3.5 cm, grade 2.  Tumor invades through musculoskeletal propria into the peri-colorectal tissue.  Margins negative.  Tumor deposits present, 2.  1 out of 9 lymph nodes examined were positive for malignancy. pT3pN1a  Patient lives alone and has no social support.  Children live around [place] but are not able to take care of her.  Initially she reported fatigue trouble mobilizing around house.  She has fracture of right ankle which limits her mobility and she has chronic pain.  She has colostomy in place.  She underwent CT chest to complete her staging work-up which showed 2 lung nodules, one in the right upper lobe, and one in the left upper lobe this was followed by PET scan which showed the left upper lobe was not hypermetabolic.  The right upper lobe lung nodule measuring 1.6 with an SUV of 6.8.  No hypermetabolic axillary, mediastinal, or hilar lymphadenopathy or evidence of metastatic disease elsewhere.  Patient  underwent CT-guided biopsy of the right upper lobe lung nodule which was consistent with small cell lung cancer positive for CD56 and TTF-1.  She initiated [place] and etoposide on 03/17/2019.   Malignant neoplasm of colon ([place])  12/19/2018 Initial Diagnosis   Malignant neoplasm of colon ([place])   12/19/2018 Cancer Staging   Staging form: Colon and Rectum, AJCC 8th Edition - Pathologic stage from 12/19/2018: Stage IIIB (pT3, pN1a, cM0) - Signed by Sindy Guadeloupe, MD on 12/22/2018   Small cell lung cancer ([place])  02/24/2019 Initial Diagnosis   Small cell lung cancer ([place])   02/24/2019 Cancer Staging   Staging form: Lung, AJCC 8th Edition - Clinical stage from 02/24/2019: cT1, cN0, cM0 - Signed by Sindy Guadeloupe, MD on 02/24/2019   03/17/2019 -  Chemotherapy   The patient had palonosetron (ALOXI) injection 0.25 mg, 0.25 mg, Intravenous,  Once, 1 of 4 cycles Administration: 0.25 mg (03/17/2019) pegfilgrastim (NEULASTA ONPRO KIT) injection 6 mg, 6 mg, Subcutaneous, Once, 1 of 1 cycle Administration: 6 mg (03/19/2019) CARBOplatin (PARAPLATIN) 400 mg in sodium chloride 0.9 % 250 mL chemo infusion, 400 mg (100 % of original dose 404 mg), Intravenous,  Once, 1 of 4 cycles Dose modification:   (original dose 404 mg, Cycle 1) Administration: 400 mg (03/17/2019) etoposide (VEPESID) 180 mg in sodium chloride 0.9 % 500 mL chemo infusion, 100 mg/m2 = 180 mg, Intravenous,  Once, 1 of 4 cycles Administration: 180 mg (03/17/2019), 180 mg (03/18/2019), 180 mg (03/19/2019)  for chemotherapy treatment.       Interval history-  Gloria Allen, 73 year old female, presents to chemo care clinic today for initial  meeting. I introduced the chemo care clinic and we discussed that the role of the clinic is to assist those who are at an increased risk of emergency room visits and/or complications during the course of chemotherapy treatment. We discussed that the increased risk takes into account factors such as age, performance  status, and co-morbidities. We also discussed that for some, this might include barriers to care such as not having a primary care provider, lack of insurance/transportation, or not being able to afford medications. We discussed that the goal of the program is to help prevent unplanned ER visits and help reduce complications during chemotherapy. We do this by discussing specific risk factors to each individual and identifying ways that we can help improve these risk factors and reduce barriers to care.   ECOG FS:1 - Symptomatic but completely ambulatory  Review of systems- Review of Systems  Constitutional: Negative for chills, fever, malaise/fatigue and weight loss.  HENT: Negative for congestion, ear discharge, ear pain, sinus pain, sore throat and tinnitus.   Eyes: Negative.   Respiratory: Negative.  Negative for cough, sputum production and shortness of breath.   Cardiovascular: Negative for chest pain, palpitations, orthopnea, claudication and leg swelling.  Gastrointestinal: Negative for abdominal pain, blood in stool, constipation, diarrhea, heartburn, nausea and vomiting.  Genitourinary: Negative.   Musculoskeletal: Negative.   Skin: Negative.   Neurological: Negative for dizziness, tingling, weakness and headaches.  Endo/Heme/Allergies: Negative.   Psychiatric/Behavioral: Negative.      Current treatment- Small cell lung cancer-carboplatin and etoposide colon cancer-observation  Allergies  Allergen Reactions  . Ace Inhibitors Swelling  . Angiotensin Receptor Blockers Swelling  . Sulfa Antibiotics Itching  . Chlorthalidone Rash  . Flexeril [Cyclobenzaprine] Rash     Past Medical History:  Diagnosis Date  . Anxiety   . Arthritis   . Asthma   . Cancer ([place])   . Cervical central spinal stenosis (C3-C7) (worse at C4-5) 07/30/2017  . Cervical foraminal stenosis (C4-5 and C5-6) (Bilateral) (L>R) 07/30/2017  . Chronic lower extremity pain (Fourth Area of Pain) (Bilateral)  (R>L) 06/11/2017  . Chronic neck pain (Primary Area of Pain) (Bilateral) (R>L) 06/11/2017  . Chronic sacroiliac joint pain (Bilateral) (R>L) 06/11/2017  . Chronic shoulder pain [facility] Area of Pain) (Right) 06/11/2017  . Chronic upper extremity pain (Fifth Area of Pain) (Bilateral) (R>L) 06/11/2017  . Colon cancer ([place])   . DDD (degenerative disc disease), cervical 07/30/2017  . DDD (degenerative disc disease), lumbar 07/30/2017  . Depression   . DISH (diffuse idiopathic skeletal hyperostosis) 07/30/2017  . Dyspnea    with exertion  . Entrapment syndrome 06/20/2017   2002 on R and 2010 on L  . Full thickness rotator cuff tear 06/12/2017  . GERD (gastroesophageal reflux disease)   . Grade 1 Anterolisthesis of L4 over L5 07/30/2017  . Headache   . Hx: UTI (urinary tract infection)   . Hypertension   . Inflammation of joint of shoulder region 01/15/2017  . Lumbar central spinal stenosis (L4-5) 07/30/2017  . Lumbar disc protrusion (Left: L5-S1) (Right: L1-2) 07/30/2017   L5-S1 left foraminal protrusion with L5 impingement. L1-2 right paracentral protrusion without impingement.  . Lumbar facet arthropathy (Bilateral) 07/30/2017  . Lumbar facet syndrome (Bilateral) (R>L) 07/30/2017  . Lung cancer ([place])   . Osteoarthritis of shoulder (Right) 01/15/2017     Past Surgical History:  Procedure Laterality Date  . ABDOMINAL HYSTERECTOMY    . APPENDECTOMY    . BREAST REDUCTION SURGERY Bilateral   .  COLONOSCOPY WITH PROPOFOL N/A 11/05/2018   Procedure: COLONOSCOPY WITH PROPOFOL;  Surgeon: Virgel Manifold, MD;  Location: [facility] ENDOSCOPY;  Service: Endoscopy;  Laterality: N/A;  . COLONOSCOPY WITH PROPOFOL N/A 11/06/2018   Procedure: COLONOSCOPY WITH PROPOFOL;  Surgeon: Virgel Manifold, MD;  Location: [facility] ENDOSCOPY;  Service: Endoscopy;  Laterality: N/A;  . COLOSTOMY    . DILATION AND CURETTAGE OF UTERUS    . ESOPHAGOGASTRODUODENOSCOPY (EGD) WITH PROPOFOL N/A 11/05/2018   Procedure:  ESOPHAGOGASTRODUODENOSCOPY (EGD) WITH PROPOFOL;  Surgeon: Virgel Manifold, MD;  Location: [facility] ENDOSCOPY;  Service: Endoscopy;  Laterality: N/A;  . ILEOSTOMY Right 11/27/2018   Procedure: ILEOSTOMY;  Surgeon: Jules Husbands, MD;  Location: [facility] ORS;  Service: General;  Laterality: Right;  . LAPAROSCOPIC SIGMOID COLECTOMY N/A 11/27/2018   Procedure: LAPAROSCOPIC SIGMOID COLECTOMY;  Surgeon: Jules Husbands, MD;  Location: [facility] ORS;  Service: General;  Laterality: N/A;  . ORIF ANKLE FRACTURE Right 11/21/2018   Procedure: OPEN REDUCTION INTERNAL FIXATION (ORIF) ANKLE FRACTURE;  Surgeon: Corky Mull, MD;  Location: [facility] ORS;  Service: Orthopedics;  Laterality: Right;  . PORTACATH PLACEMENT N/A 03/12/2019   Procedure: INSERTION PORT-A-CATH;  Surgeon: Jules Husbands, MD;  Location: [facility] ORS;  Service: General;  Laterality: N/A;  . REDUCTION MAMMAPLASTY    . SHOULDER ARTHROSCOPY WITH OPEN ROTATOR CUFF REPAIR AND DISTAL CLAVICLE ACROMINECTOMY Right 03/26/2017   Procedure: right shoulder arthroscopy, arthroscopic subacromial decompression, distal clavicle excision, mini open rotator cuff repair;  Surgeon: Thornton Park, MD;  Location: [facility] ORS;  Service: Orthopedics;  Laterality: Right;    Social History   Socioeconomic History  . Marital status: Divorced    Spouse name: Not on file  . Number of children: 3  . Years of education: Not on file  . Highest education level: 11th grade  Occupational History  . Not on file  Social Needs  . Financial resource strain: Somewhat hard  . Food insecurity    Worry: Sometimes true    Inability: Never true  . Transportation needs    Medical: Yes    Non-medical: No  Tobacco Use  . Smoking status: Former Smoker    Packs/day: 1.00    Types: Cigarettes    Quit date: 2010    Years since quitting: 10.4  . Smokeless tobacco: Never Used  . Tobacco comment: quit over 30 years ago   Substance and Sexual Activity  . Alcohol use: No  . Drug use: Yes     Types: Marijuana  . Sexual activity: Never  Lifestyle  . Physical activity    Days per week: 0 days    Minutes per session: 0 min  . Stress: Not at all  Relationships  . Social Herbalist on phone: Never    Gets together: Never    Attends religious service: More than 4 times per year    Active member of club or organization: No    Attends meetings of clubs or organizations: Never    Relationship status: Divorced  . Intimate partner violence    Fear of current or ex partner: No    Emotionally abused: No    Physically abused: No    Forced sexual activity: No  Other Topics Concern  . 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 She receives disability. She has 3 children and several grandchildren. She lives alone. She drives. Uses a walker and shower chair at baseline.  Family History  Problem Relation Age of Onset  . Hypertension Mother   . Breast cancer Mother 65  . Breast cancer Sister 33     Current Outpatient Medications:  .  albuterol (PROVENTIL HFA;VENTOLIN HFA) 108 (90 Base) MCG/ACT inhaler, Inhale 2 puffs into the lungs every 6 (six) hours as needed for wheezing or shortness of breath., Disp: 1 Inhaler, Rfl: 3 .  amLODipine (NORVASC) 10 MG tablet, Take 1 tablet by mouth 1 day or 1 dose., Disp: , Rfl:  .  oxyCODONE-acetaminophen (PERCOCET/ROXICET) 5-325 MG tablet, Take 1 tablet by mouth every 6 (six) hours as needed for severe pain., Disp: 20 tablet, Rfl: 0 .  Potassium 99 MG TABS, Take 1 tablet by mouth daily as needed (cramps). , Disp: , Rfl:  .  prochlorperazine (COMPAZINE) 10 MG tablet, Take 1 tablet (10 mg total) by mouth every 6 (six) hours as needed (Nausea or vomiting)., Disp: 30 tablet, Rfl: 1 .  sertraline (ZOLOFT) 100 MG tablet, Take 2 tablets (200 mg total) by mouth at bedtime., Disp: 60 tablet, Rfl: 3 .  terazosin (HYTRIN) 5 MG capsule, TAKE 1 CAPSULE BY MOUTH ONCE DAILY AT BEDTIME, Disp: 30 capsule, Rfl:  0 .  traZODone (DESYREL) 50 MG tablet, Take 0.5-1 tablets (25-50 mg total) by mouth at bedtime as needed for sleep., Disp: 30 tablet, Rfl: 3 .  ANORO ELLIPTA 62.5-25 MCG/INH AEPB, INHALE 1 PUFF BY MOUTH DAILY (Patient not taking: Reported on 03/17/2019), Disp: 60 each, Rfl: 0 .  BREO ELLIPTA 200-25 MCG/INH AEPB, Inhale 1 puff into the lungs daily. , Disp: , Rfl:  .  clonazePAM (KLONOPIN) 0.5 MG tablet, Take 0.5 mg by mouth 3 (three) times daily. , Disp: , Rfl:  .  cloNIDine (CATAPRES) 0.3 MG tablet, Take 2 tablets (0.6 mg total) by mouth 2 (two) times daily. (Patient taking differently: Take 0.3 mg by mouth 2 (two) times daily. ), Disp: 120 tablet, Rfl: 2 .  hydrALAZINE (APRESOLINE) 100 MG tablet, Take 1 tablet (100 mg total) by mouth 3 (three) times daily. (Patient taking differently: Take 100 mg by mouth daily. ), Disp: 90 tablet, Rfl: 1 .  HYDROcodone-acetaminophen (NORCO/VICODIN) 5-325 MG tablet, Take 1 tablet by mouth every 6 (six) hours as needed for moderate pain. (Patient not taking: Reported on 03/17/2019), Disp: 8 tablet, Rfl: 0 .  hydrOXYzine (ATARAX/VISTARIL) 10 MG tablet, TAKE 1 TABLET BY MOUTH THREE TIMES A DAY AS NEEDED (Patient taking differently: Take 10 mg by mouth 3 (three) times daily as needed for itching. ), Disp: 90 tablet, Rfl: 0 .  lidocaine-prilocaine (EMLA) cream, Apply to affected area once, Disp: 30 g, Rfl: 3 .  loperamide (IMODIUM) 2 MG capsule, Take 2 capsules (4 mg total) by mouth 3 (three) times daily., Disp: 60 capsule, Rfl: 2 .  LORazepam (ATIVAN) 0.5 MG tablet, Take 1 tablet (0.5 mg total) by mouth every 8 (eight) hours as needed for anxiety., Disp: 90 tablet, Rfl: 0 .  lovastatin (MEVACOR) 40 MG tablet, Take 1 tablet (40 mg total) by mouth at bedtime., Disp: 90 tablet, Rfl: 1 .  meclizine (ANTIVERT) 12.5 MG tablet, Take 12.5 mg by mouth 3 (three) times daily as needed for dizziness. , Disp: , Rfl: 0 .  megestrol (MEGACE) 400 MG/10ML suspension, Take 10 mLs (400 mg  total) by mouth daily for 30 doses., Disp: 300 mL, Rfl: 2 .  methocarbamol (ROBAXIN) 500 MG tablet, Take 1 tablet (500 mg total) by mouth at bedtime as needed for muscle  spasms., Disp: 30 tablet, Rfl: 0 .  metoprolol succinate (TOPROL-XL) 100 MG 24 hr tablet, TAKE 1 TABLET BY MOUTH DAILY, Disp: 30 tablet, Rfl: 0 .  mirtazapine (REMERON) 15 MG tablet, take 1 tablet by mouth at bedtime for APPETITIE (Patient taking differently: Take 15 mg by mouth at bedtime. take 1 tablet by mouth at bedtime for APPETITIE), Disp: 180 tablet, Rfl: 0 .  montelukast (SINGULAIR) 10 MG tablet, Take 1 tablet (10 mg total) by mouth at bedtime as needed., Disp: 90 tablet, Rfl: 1 .  OLANZapine (ZYPREXA) 10 MG tablet, Take 1 tablet (10 mg total) by mouth at bedtime., Disp: 30 tablet, Rfl: 1 .  omeprazole (PRILOSEC) 40 MG capsule, Take 1 capsule (40 mg total) by mouth 2 (two) times daily., Disp: 60 capsule, Rfl: 2 .  ondansetron (ZOFRAN) 8 MG tablet, Take 1 tablet (8 mg total) by mouth 2 (two) times daily as needed for refractory nausea / vomiting. Start on day 3 after carboplatin chemo., Disp: 30 tablet, Rfl: 1 .  oxybutynin (DITROPAN) 5 MG tablet, TAKE 1 TABLET BY MOUTH TWICE DAILY (Patient not taking: No sig reported), Disp: 180 tablet, Rfl: 0 .  triamcinolone cream (KENALOG) 0.1 %, APPLY TOPICALLY TWICE DAILY AS NEEDED FOR ITCHING (Patient taking differently: Apply 1 application topically 2 (two) times daily as needed (itching). ), Disp: 30 g, Rfl: 0  Physical exam:  Vitals:   03/23/19 1441  BP: (!) 181/78  Pulse: 73  Resp: 18  Temp: 97.6 F (36.4 C)  TempSrc: Tympanic   Physical Exam Constitutional:      Appearance: She is well-developed.  HENT:     Head: Atraumatic.     Nose: Nose normal.     Mouth/Throat:     Pharynx: No oropharyngeal exudate.  Eyes:     General: No scleral icterus.    Conjunctiva/sclera: Conjunctivae normal.  Neck:     Musculoskeletal: Normal range of motion.  Cardiovascular:     Rate  and Rhythm: Normal rate and regular rhythm.     Heart sounds: Normal heart sounds.  Pulmonary:     Effort: Pulmonary effort is normal.     Breath sounds: Normal breath sounds.  Abdominal:     General: Bowel sounds are normal.     Palpations: Abdomen is soft.  Skin:    General: Skin is warm and dry.  Neurological:     Mental Status: She is alert and oriented to person, place, and time.      CMP Latest Ref Rng & Units 03/17/2019  Glucose 70 - 99 mg/dL 135(H)  BUN 8 - 23 mg/dL 13  Creatinine 0.44 - 1.00 mg/dL 0.78  Sodium 135 - 145 mmol/L 139  Potassium 3.5 - 5.1 mmol/L 3.6  Chloride 98 - 111 mmol/L 106  CO2 22 - 32 mmol/L 21(L)  Calcium 8.9 - 10.3 mg/dL 10.1  Total Protein 6.5 - 8.1 g/dL 8.4(H)  Total Bilirubin 0.3 - 1.2 mg/dL 0.3  Alkaline Phos 38 - 126 U/L 111  AST 15 - 41 U/L 23  ALT 0 - 44 U/L 21   CBC Latest Ref Rng & Units 03/17/2019  WBC 4.0 - 10.5 K/uL 9.1  Hemoglobin 12.0 - 15.0 g/dL 13.1  Hematocrit 36.0 - 46.0 % 40.1  Platelets 150 - 400 K/uL 378    No images are attached to the encounter.  Mr Jeri Cos Wo Contrast  Result Date: 02/26/2019 CLINICAL DATA:  History of lung and colon cancer.  Dizziness. EXAM: MRI  HEAD WITHOUT AND WITH CONTRAST TECHNIQUE: Multiplanar, multiecho pulse sequences of the brain and surrounding structures were obtained without and with intravenous contrast. CONTRAST:  6 cc Gadavist COMPARISON:  01/31/2017 FINDINGS: Brain: Numerous old small infarctions in the inferior cerebellum on the left. No brainstem abnormality is seen. Cerebral hemispheres elsewhere show moderate chronic small-vessel change of the deep and subcortical white matter. No cortical or large vessel territory infarction. No mass lesion, hemorrhage, hydrocephalus or extra-axial collection. No abnormal contrast enhancement occurs. Vascular: Major vessels at the base of the brain show flow. Skull and upper cervical spine: Negative Sinuses/Orbits: Clear/normal Other: None IMPRESSION:  No acute or reversible finding. Chronic small-vessel ischemic changes of the cerebral hemispheric white matter. Numerous old small vessel infarctions at the inferior cerebellum on the left. No evidence of metastatic disease. Electronically Signed   By: Nelson Chimes M.D.   On: 02/26/2019 15:15   Dg Chest Port 1 View  Result Date: 03/12/2019 CLINICAL DATA:  Port-A-Cath placement. EXAM: PORTABLE CHEST 1 VIEW COMPARISON:  Chest x-ray 02/12/2019 FINDINGS: Right IJ power port tip is at the level of the carina in the mid SVC. No complicating features. The cardiac silhouette, mediastinal and hilar contours are stable. The lungs are clear. No pleural effusion. IMPRESSION: Right IJ power port in good position with the tip in the mid SVC. No complicating features. Electronically Signed   By: Marijo Sanes M.D.   On: 03/12/2019 14:26   Dg Be (colon)w Single Cm (sol Or Thin Ba)  Result Date: 02/25/2019 CLINICAL DATA:  Rectal pain, s/p sigmoid resection with reanastomosis and diverting ileostomy EXAM: BE LIMITED WITH CONTRAST CONTRAST:  Single-contrast barium FLUOROSCOPY TIME:  Fluoroscopy Time:  1:00 Number of Acquired Spot Images: 8 COMPARISON:  PET-CT, 02/05/2019 FINDINGS: Following rectal cannulation and precontrast spot images, barium contrast was administered through the rectal tube under gentle gravitational pressure to the limit of patient tolerance and catheter leakage. The rectum is contrast opacified to the sigmorectal anastomosis and just proximal to the visualized sutures, but does not pass further. No evidence of leak or fistula. IMPRESSION: The rectum is contrast opacified to the sigmorectal anastomosis and just proximal to the visualized sutures, but does not pass further. There may be stenosis or adhesion in the vicinity of the anastomosis. No evidence of leak or fistula. Electronically Signed   By: Eddie Candle M.D.   On: 02/25/2019 11:01   Dg C-arm 1-60 Min-no Report  Result Date:  03/12/2019 Fluoroscopy was utilized by the requesting physician.  No radiographic interpretation.     Assessment and plan- Patient is a 73 y.o. female who presents to [facility] for initial meeting in preparation for starting chemotherapy for the treatment of small cell lung cancer   1. Small cell lung cancer-stage I- Follow up imaging for below history of colon cancer revealed hypermetabolic 1.6 cm solid medial RUL nodule. Pathology consistent with small cell carcinoma. Currently s/p cycle 1 carbo and etoposide with concurrent radiation. Managed by Dr. Janese Banks  2. Colon Cancer- s/p hemicolectomy 11/2018 with Dr. Dahlia Byes. Refused adjuvant treatment. Currently on surveillance.   3. Chemo Care Clinic/High Risk for ER/Hospitalization during chemotherapy- We discussed the role of the chemo care clinic and identified patient specific risk factors. I discussed that patient was identified as high risk primarily based on: recent hospital admissions, ER visits, Medicare status, anemia, asthma, CKD, CTD, depression.   She has a PCP, Dr. Paulo Fruit [facility], but due to proximity she has not seen  him in quite sometime and is out of many medications. I am waiting to hear back from clinic for last note and to see about transferring her care to a closer location such as [name] vs others.   Unable to perform medication reconciliation today due to her being unsure what she should be taking and is out of vs what she has completed course of. I received copy of her fill record from [place] and reviewed. Would like to get her back to PCP for management of chronic issues and health maintenance.  We discussed options for care given her recent hospitalizations and ER visits including when to seek care from PCP vs Urgent Care vs Symptom Management Clinic vs specialists vs ER.   Anemia- currently resolved. Hemoglobin 13.1 recently. Dr. Janese Banks will be checking labs along with chemotherapy to  follow.   4. Social Determinants of Health- we discussed that social determinants of health may have significant impacts on health and outcomes for cancer patients.  Today we discussed specific social determinants of performance status, alcohol use, depression, financial needs, food insecurity, housing, interpersonal violence, social connections, stress, tobacco use, and transportation.  After lengthy discussion the following were identified as areas of need: performance status/ecog, depression, financial insecurity, food insecurity, lack of social connections, increased stress/coping, and transportation needs.   Based on performance status/activity level we discussed options including home based and outpatient services, DME, and CARE program. We discusssed that patients who participate in regular physical activity report fewer negative impacts of cancer and treatments and report less fatigue.   Based on depression: we discussed self-referral to [facility] for counseling services, psychiatry for medication management, or palliative care/symptom management as well as primary care providers.   Based on financial insecurity: We discussed that living with cancer can create tremendous financial burden.  We discussed options for assistance. I asked that if assistance is needed in affording medications or paying bills to please let us know so that we can provide assistance.   Based on food insecurity-we discussed options for food including social services.  We will also notify [facility] to see if cancer center can provide support.  Patient informed of food pantry at cancer center and was provided with care package today.  Please notify nursing if un-met needs.  Based on lack of social connections-we discussed options for support groups at the cancer center. If interested, please notify nurse navigator to enroll.   Based on concern of stress-we discussed options for managing stress including healthy eating,  exercise as well as participating in no charge counseling services at the cancer center and support groups.  If these are of interest, patient can notify either myself or primary nursing team.  Based on transportation need: we discussed options for transportation including [employer], paratransit, bus routes, [employer] transit, taxi/uber/lyft, and cancer center van.  I have notified primary oncology team who will help assist with arranging Lucianne Lei transportation for appointments when needed. We also discussed options for transportation on short notice/acute visits.   5. Co-morbidities Complicating Care:  Hypertension- blood pressure elevated in clinic today- anxiety likely contributing but based on medication lists I suspect chronic issue as well. Will try to schedule her to see PCP for evaluation and management.   Colon Cancer- Ileostomy- lap sigmoid ostomy- barium enema showed no evidence of anastomotic leak but contrast did not reach descending colon and splenic flexure. Dr. Dahlia Byes recommended flex sig with anoscopy to assess in the future.   Abdominal Pain &  Nausea- etiology unclear. Compazine 10 mg PO given in clinic with resolution of symptoms. She was prescribed compazine by Dr. Janese Banks but does not appear to have picked up or used prescription. Reinforced education regarding anti-emetics.   Home Health- she has home health currently for ostomy. Continue and recommend re-evaluating needs in the future.   6. Goals of Care- we discussed referral to outpatient palliative care for advanced care planning and symptom management. She is agreeable to referral.   We also discussed the role of the [facility] at [facility] and methods of contacting clinic/provider. She denies needing specific assistance at this time and She will be followed by Telford Nab, RN (Nurse Navigator).   Follow up with Dr. Janese Banks as scheduled. Refer to outpatient palliative care. Follow up with PCP to re-establish or transfer care.     Visit Diagnosis 1. Small cell lung cancer ([place])   2. Malignant neoplasm of colon, unspecified part of colon ([place])   3. Goals of care, counseling/discussion     Patient expressed understanding and was in agreement with this plan. She also understands that She can call clinic at any time with any questions, concerns, or complaints.   A total of (55) minutes of face-to-face time was spent with this patient with greater than 50% of that time in counseling and care-coordination.  Beckey Rutter, DNP, AGNP-C [facility] (work cell) [phone] (office)  CC: Dr. Baldwin [place], RN, Dr. Delcie Roch

## 2019-03-19 NOTE — Progress Notes (Deleted)
Entered in error

## 2019-03-19 NOTE — Progress Notes (Signed)
Pt tolerated infusion well. Education provided for [place] [facility] and home medications to help with nausea/vomiting. Pt verbalizes understanding. Pt stable at discharge.

## 2019-03-20 ENCOUNTER — Ambulatory Visit
Admission: RE | Admit: 2019-03-20 | Discharge: 2019-03-20 | Disposition: A | Discharge status: Home / Self Care | Payer: Medicare Other | Source: Ambulatory Visit | Attending: Radiation Oncology | Admitting: Radiation Oncology

## 2019-03-20 ENCOUNTER — Other Ambulatory Visit: Payer: Medicare Other

## 2019-03-20 ENCOUNTER — Other Ambulatory Visit: Payer: Self-pay

## 2019-03-20 DIAGNOSIS — Z510 Encounter for antineoplastic radiation therapy: Secondary | ICD-10-CM | POA: Diagnosis not present

## 2019-03-20 DIAGNOSIS — Z87891 Personal history of nicotine dependence: Secondary | ICD-10-CM | POA: Diagnosis not present

## 2019-03-20 DIAGNOSIS — C3411 Malignant neoplasm of upper lobe, right bronchus or lung: Secondary | ICD-10-CM | POA: Diagnosis not present

## 2019-03-21 DIAGNOSIS — M80071D Age-related osteoporosis with current pathological fracture, right ankle and foot, subsequent encounter for fracture with routine healing: Secondary | ICD-10-CM | POA: Diagnosis not present

## 2019-03-21 DIAGNOSIS — M450 Ankylosing spondylitis of multiple sites in spine: Secondary | ICD-10-CM | POA: Diagnosis not present

## 2019-03-21 DIAGNOSIS — G894 Chronic pain syndrome: Secondary | ICD-10-CM | POA: Diagnosis not present

## 2019-03-21 DIAGNOSIS — M80062D Age-related osteoporosis with current pathological fracture, left lower leg, subsequent encounter for fracture with routine healing: Secondary | ICD-10-CM | POA: Diagnosis not present

## 2019-03-21 DIAGNOSIS — Z85038 Personal history of other malignant neoplasm of large intestine: Secondary | ICD-10-CM | POA: Diagnosis not present

## 2019-03-21 DIAGNOSIS — Z87440 Personal history of urinary (tract) infections: Secondary | ICD-10-CM | POA: Diagnosis not present

## 2019-03-21 DIAGNOSIS — J40 Bronchitis, not specified as acute or chronic: Secondary | ICD-10-CM | POA: Diagnosis not present

## 2019-03-21 DIAGNOSIS — M4692 Unspecified inflammatory spondylopathy, cervical region: Secondary | ICD-10-CM | POA: Diagnosis not present

## 2019-03-21 DIAGNOSIS — M7500 Adhesive capsulitis of unspecified shoulder: Secondary | ICD-10-CM | POA: Diagnosis not present

## 2019-03-21 DIAGNOSIS — Z433 Encounter for attention to colostomy: Secondary | ICD-10-CM | POA: Diagnosis not present

## 2019-03-21 DIAGNOSIS — Z9181 History of falling: Secondary | ICD-10-CM | POA: Diagnosis not present

## 2019-03-21 DIAGNOSIS — K219 Gastro-esophageal reflux disease without esophagitis: Secondary | ICD-10-CM | POA: Diagnosis not present

## 2019-03-21 DIAGNOSIS — Z7901 Long term (current) use of anticoagulants: Secondary | ICD-10-CM | POA: Diagnosis not present

## 2019-03-21 DIAGNOSIS — I10 Essential (primary) hypertension: Secondary | ICD-10-CM | POA: Diagnosis not present

## 2019-03-21 DIAGNOSIS — M4802 Spinal stenosis, cervical region: Secondary | ICD-10-CM | POA: Diagnosis not present

## 2019-03-21 DIAGNOSIS — M48061 Spinal stenosis, lumbar region without neurogenic claudication: Secondary | ICD-10-CM | POA: Diagnosis not present

## 2019-03-22 ENCOUNTER — Other Ambulatory Visit: Payer: Self-pay | Admitting: *Deleted

## 2019-03-22 DIAGNOSIS — C3490 Malignant neoplasm of unspecified part of unspecified bronchus or lung: Secondary | ICD-10-CM

## 2019-03-23 ENCOUNTER — Inpatient Hospital Stay: Payer: Medicare Other

## 2019-03-23 ENCOUNTER — Other Ambulatory Visit: Payer: Self-pay

## 2019-03-23 ENCOUNTER — Inpatient Hospital Stay: Payer: Medicare Other | Admitting: Hospice and Palliative Medicine

## 2019-03-23 ENCOUNTER — Ambulatory Visit
Admission: RE | Admit: 2019-03-23 | Discharge: 2019-03-23 | Disposition: A | Discharge status: Home / Self Care | Payer: Medicare Other | Source: Ambulatory Visit | Attending: Radiation Oncology | Admitting: Radiation Oncology

## 2019-03-23 ENCOUNTER — Inpatient Hospital Stay (HOSPITAL_BASED_OUTPATIENT_CLINIC_OR_DEPARTMENT_OTHER): Payer: Medicare Other | Admitting: Nurse Practitioner

## 2019-03-23 VITALS — BP 181/78 | HR 73 | Temp 97.6°F | Resp 18

## 2019-03-23 DIAGNOSIS — R109 Unspecified abdominal pain: Secondary | ICD-10-CM | POA: Diagnosis not present

## 2019-03-23 DIAGNOSIS — K769 Liver disease, unspecified: Secondary | ICD-10-CM | POA: Diagnosis not present

## 2019-03-23 DIAGNOSIS — C3490 Malignant neoplasm of unspecified part of unspecified bronchus or lung: Secondary | ICD-10-CM | POA: Diagnosis not present

## 2019-03-23 DIAGNOSIS — G8929 Other chronic pain: Secondary | ICD-10-CM | POA: Diagnosis not present

## 2019-03-23 DIAGNOSIS — C3411 Malignant neoplasm of upper lobe, right bronchus or lung: Secondary | ICD-10-CM | POA: Diagnosis not present

## 2019-03-23 DIAGNOSIS — I129 Hypertensive chronic kidney disease with stage 1 through stage 4 chronic kidney disease, or unspecified chronic kidney disease: Secondary | ICD-10-CM | POA: Diagnosis not present

## 2019-03-23 DIAGNOSIS — E876 Hypokalemia: Secondary | ICD-10-CM | POA: Diagnosis not present

## 2019-03-23 DIAGNOSIS — M533 Sacrococcygeal disorders, not elsewhere classified: Secondary | ICD-10-CM | POA: Diagnosis not present

## 2019-03-23 DIAGNOSIS — M5030 Other cervical disc degeneration, unspecified cervical region: Secondary | ICD-10-CM | POA: Diagnosis not present

## 2019-03-23 DIAGNOSIS — B370 Candidal stomatitis: Secondary | ICD-10-CM | POA: Diagnosis not present

## 2019-03-23 DIAGNOSIS — Z7951 Long term (current) use of inhaled steroids: Secondary | ICD-10-CM | POA: Diagnosis not present

## 2019-03-23 DIAGNOSIS — Z933 Colostomy status: Secondary | ICD-10-CM | POA: Diagnosis not present

## 2019-03-23 DIAGNOSIS — Z87891 Personal history of nicotine dependence: Secondary | ICD-10-CM | POA: Diagnosis not present

## 2019-03-23 DIAGNOSIS — C189 Malignant neoplasm of colon, unspecified: Secondary | ICD-10-CM | POA: Diagnosis not present

## 2019-03-23 DIAGNOSIS — A419 Sepsis, unspecified organism: Secondary | ICD-10-CM | POA: Diagnosis not present

## 2019-03-23 DIAGNOSIS — Z1159 Encounter for screening for other viral diseases: Secondary | ICD-10-CM | POA: Diagnosis not present

## 2019-03-23 DIAGNOSIS — R918 Other nonspecific abnormal finding of lung field: Secondary | ICD-10-CM | POA: Diagnosis not present

## 2019-03-23 DIAGNOSIS — C187 Malignant neoplasm of sigmoid colon: Secondary | ICD-10-CM

## 2019-03-23 DIAGNOSIS — M4802 Spinal stenosis, cervical region: Secondary | ICD-10-CM | POA: Diagnosis not present

## 2019-03-23 DIAGNOSIS — K921 Melena: Secondary | ICD-10-CM | POA: Diagnosis not present

## 2019-03-23 DIAGNOSIS — Z5189 Encounter for other specified aftercare: Secondary | ICD-10-CM | POA: Diagnosis not present

## 2019-03-23 DIAGNOSIS — N189 Chronic kidney disease, unspecified: Secondary | ICD-10-CM | POA: Diagnosis not present

## 2019-03-23 DIAGNOSIS — Z5111 Encounter for antineoplastic chemotherapy: Secondary | ICD-10-CM | POA: Diagnosis not present

## 2019-03-23 DIAGNOSIS — Z7189 Other specified counseling: Secondary | ICD-10-CM

## 2019-03-23 DIAGNOSIS — S82841S Displaced bimalleolar fracture of right lower leg, sequela: Secondary | ICD-10-CM | POA: Diagnosis not present

## 2019-03-23 DIAGNOSIS — M4810 Ankylosing hyperostosis [Forestier], site unspecified: Secondary | ICD-10-CM | POA: Diagnosis not present

## 2019-03-23 DIAGNOSIS — E872 Acidosis: Secondary | ICD-10-CM | POA: Diagnosis not present

## 2019-03-23 DIAGNOSIS — K219 Gastro-esophageal reflux disease without esophagitis: Secondary | ICD-10-CM | POA: Diagnosis not present

## 2019-03-23 DIAGNOSIS — R1030 Lower abdominal pain, unspecified: Secondary | ICD-10-CM | POA: Diagnosis not present

## 2019-03-23 DIAGNOSIS — E785 Hyperlipidemia, unspecified: Secondary | ICD-10-CM | POA: Diagnosis not present

## 2019-03-23 DIAGNOSIS — M5136 Other intervertebral disc degeneration, lumbar region: Secondary | ICD-10-CM | POA: Diagnosis not present

## 2019-03-23 DIAGNOSIS — R110 Nausea: Secondary | ICD-10-CM

## 2019-03-23 DIAGNOSIS — D72829 Elevated white blood cell count, unspecified: Secondary | ICD-10-CM | POA: Diagnosis not present

## 2019-03-23 DIAGNOSIS — J449 Chronic obstructive pulmonary disease, unspecified: Secondary | ICD-10-CM | POA: Diagnosis not present

## 2019-03-23 DIAGNOSIS — R102 Pelvic and perineal pain: Secondary | ICD-10-CM | POA: Diagnosis not present

## 2019-03-23 DIAGNOSIS — N390 Urinary tract infection, site not specified: Secondary | ICD-10-CM | POA: Diagnosis not present

## 2019-03-23 DIAGNOSIS — R350 Frequency of micturition: Secondary | ICD-10-CM | POA: Diagnosis not present

## 2019-03-23 DIAGNOSIS — I10 Essential (primary) hypertension: Secondary | ICD-10-CM | POA: Diagnosis not present

## 2019-03-23 DIAGNOSIS — E860 Dehydration: Secondary | ICD-10-CM | POA: Diagnosis not present

## 2019-03-23 MED ORDER — PROCHLORPERAZINE MALEATE 10 MG PO TABS
10.0000 mg | ORAL_TABLET | Freq: Once | ORAL | Status: AC
  Administered 2019-03-23: 10 mg via ORAL
  Filled 2019-03-23: qty 1

## 2019-03-23 NOTE — Telephone Encounter (Signed)
It looks like she is seeing [facility]- can we confirm with them that she has transferred care? If so I'll let the cancer center know. Thanks!

## 2019-03-23 NOTE — Telephone Encounter (Signed)
Noted! Thank you

## 2019-03-23 NOTE — Telephone Encounter (Signed)
Called and spoke w/ [facility]. They confirm pt is now under one of their provider's care.

## 2019-03-23 NOTE — Telephone Encounter (Signed)
Attempted to contact [facility] x2, line states that due to network difficulties, it cannot connect line. Will attempt again later.

## 2019-03-24 ENCOUNTER — Inpatient Hospital Stay (HOSPITAL_BASED_OUTPATIENT_CLINIC_OR_DEPARTMENT_OTHER): Payer: Medicare Other | Admitting: Nurse Practitioner

## 2019-03-24 ENCOUNTER — Telehealth: Payer: Self-pay | Admitting: *Deleted

## 2019-03-24 ENCOUNTER — Other Ambulatory Visit
Admission: RE | Admit: 2019-03-24 | Discharge: 2019-03-24 | Disposition: A | Discharge status: Home / Self Care | Payer: Medicare Other | Source: Ambulatory Visit | Attending: Oncology | Admitting: Oncology

## 2019-03-24 ENCOUNTER — Encounter: Payer: Self-pay | Admitting: Emergency Medicine

## 2019-03-24 ENCOUNTER — Other Ambulatory Visit: Payer: Self-pay | Admitting: Oncology

## 2019-03-24 ENCOUNTER — Inpatient Hospital Stay
Admission: EM | Admit: 2019-03-24 | Discharge: 2019-03-27 | DRG: 872 | Disposition: A | Discharge status: Home / Self Care | Payer: Medicare Other | Attending: Internal Medicine | Admitting: Internal Medicine

## 2019-03-24 ENCOUNTER — Inpatient Hospital Stay: Payer: Medicare Other

## 2019-03-24 ENCOUNTER — Other Ambulatory Visit: Payer: Self-pay

## 2019-03-24 ENCOUNTER — Ambulatory Visit: Payer: Medicare Other

## 2019-03-24 ENCOUNTER — Encounter: Payer: Self-pay | Admitting: Nurse Practitioner

## 2019-03-24 ENCOUNTER — Ambulatory Visit
Admission: RE | Admit: 2019-03-24 | Discharge: 2019-03-24 | Disposition: A | Discharge status: Home / Self Care | Payer: Medicare Other | Source: Ambulatory Visit | Attending: Oncology | Admitting: Oncology

## 2019-03-24 VITALS — BP 204/80 | HR 112 | Temp 97.6°F | Resp 20

## 2019-03-24 DIAGNOSIS — K921 Melena: Secondary | ICD-10-CM

## 2019-03-24 DIAGNOSIS — E876 Hypokalemia: Secondary | ICD-10-CM | POA: Diagnosis present

## 2019-03-24 DIAGNOSIS — A419 Sepsis, unspecified organism: Principal | ICD-10-CM | POA: Diagnosis present

## 2019-03-24 DIAGNOSIS — Z7951 Long term (current) use of inhaled steroids: Secondary | ICD-10-CM

## 2019-03-24 DIAGNOSIS — Z5189 Encounter for other specified aftercare: Secondary | ICD-10-CM | POA: Diagnosis not present

## 2019-03-24 DIAGNOSIS — E785 Hyperlipidemia, unspecified: Secondary | ICD-10-CM | POA: Diagnosis present

## 2019-03-24 DIAGNOSIS — N390 Urinary tract infection, site not specified: Secondary | ICD-10-CM | POA: Diagnosis present

## 2019-03-24 DIAGNOSIS — Z8249 Family history of ischemic heart disease and other diseases of the circulatory system: Secondary | ICD-10-CM

## 2019-03-24 DIAGNOSIS — K769 Liver disease, unspecified: Secondary | ICD-10-CM | POA: Diagnosis not present

## 2019-03-24 DIAGNOSIS — C187 Malignant neoplasm of sigmoid colon: Secondary | ICD-10-CM | POA: Insufficient documentation

## 2019-03-24 DIAGNOSIS — C3490 Malignant neoplasm of unspecified part of unspecified bronchus or lung: Secondary | ICD-10-CM

## 2019-03-24 DIAGNOSIS — F329 Major depressive disorder, single episode, unspecified: Secondary | ICD-10-CM | POA: Diagnosis present

## 2019-03-24 DIAGNOSIS — C3411 Malignant neoplasm of upper lobe, right bronchus or lung: Secondary | ICD-10-CM | POA: Diagnosis not present

## 2019-03-24 DIAGNOSIS — I10 Essential (primary) hypertension: Secondary | ICD-10-CM | POA: Diagnosis present

## 2019-03-24 DIAGNOSIS — M5136 Other intervertebral disc degeneration, lumbar region: Secondary | ICD-10-CM | POA: Diagnosis present

## 2019-03-24 DIAGNOSIS — R350 Frequency of micturition: Secondary | ICD-10-CM | POA: Diagnosis not present

## 2019-03-24 DIAGNOSIS — R918 Other nonspecific abnormal finding of lung field: Secondary | ICD-10-CM | POA: Diagnosis not present

## 2019-03-24 DIAGNOSIS — Z882 Allergy status to sulfonamides status: Secondary | ICD-10-CM

## 2019-03-24 DIAGNOSIS — M4810 Ankylosing hyperostosis [Forestier], site unspecified: Secondary | ICD-10-CM | POA: Diagnosis present

## 2019-03-24 DIAGNOSIS — E872 Acidosis: Secondary | ICD-10-CM | POA: Diagnosis present

## 2019-03-24 DIAGNOSIS — J449 Chronic obstructive pulmonary disease, unspecified: Secondary | ICD-10-CM | POA: Diagnosis present

## 2019-03-24 DIAGNOSIS — M533 Sacrococcygeal disorders, not elsewhere classified: Secondary | ICD-10-CM | POA: Diagnosis present

## 2019-03-24 DIAGNOSIS — F419 Anxiety disorder, unspecified: Secondary | ICD-10-CM | POA: Diagnosis present

## 2019-03-24 DIAGNOSIS — M4802 Spinal stenosis, cervical region: Secondary | ICD-10-CM | POA: Diagnosis present

## 2019-03-24 DIAGNOSIS — M5030 Other cervical disc degeneration, unspecified cervical region: Secondary | ICD-10-CM | POA: Diagnosis present

## 2019-03-24 DIAGNOSIS — K219 Gastro-esophageal reflux disease without esophagitis: Secondary | ICD-10-CM | POA: Diagnosis present

## 2019-03-24 DIAGNOSIS — Z888 Allergy status to other drugs, medicaments and biological substances status: Secondary | ICD-10-CM

## 2019-03-24 DIAGNOSIS — G8929 Other chronic pain: Secondary | ICD-10-CM | POA: Diagnosis present

## 2019-03-24 DIAGNOSIS — Z803 Family history of malignant neoplasm of breast: Secondary | ICD-10-CM

## 2019-03-24 DIAGNOSIS — R102 Pelvic and perineal pain: Secondary | ICD-10-CM

## 2019-03-24 DIAGNOSIS — Z933 Colostomy status: Secondary | ICD-10-CM

## 2019-03-24 DIAGNOSIS — D72829 Elevated white blood cell count, unspecified: Secondary | ICD-10-CM | POA: Diagnosis not present

## 2019-03-24 DIAGNOSIS — R109 Unspecified abdominal pain: Secondary | ICD-10-CM

## 2019-03-24 DIAGNOSIS — E860 Dehydration: Secondary | ICD-10-CM | POA: Diagnosis not present

## 2019-03-24 DIAGNOSIS — F4310 Post-traumatic stress disorder, unspecified: Secondary | ICD-10-CM | POA: Diagnosis present

## 2019-03-24 DIAGNOSIS — B370 Candidal stomatitis: Secondary | ICD-10-CM | POA: Diagnosis present

## 2019-03-24 DIAGNOSIS — Z1159 Encounter for screening for other viral diseases: Secondary | ICD-10-CM

## 2019-03-24 DIAGNOSIS — Z5111 Encounter for antineoplastic chemotherapy: Secondary | ICD-10-CM | POA: Diagnosis not present

## 2019-03-24 DIAGNOSIS — Z87891 Personal history of nicotine dependence: Secondary | ICD-10-CM

## 2019-03-24 LAB — CBC WITH DIFFERENTIAL/PLATELET
Abs Immature Granulocytes: 1.17 10*3/uL — ABNORMAL HIGH (ref 0.00–0.07)
Abs Immature Granulocytes: 0.92 10*3/uL — ABNORMAL HIGH (ref 0.00–0.07)
Basophils Absolute: 0.1 10*3/uL (ref 0.0–0.1)
Basophils Absolute: 0.1 10*3/uL (ref 0.0–0.1)
Basophils Relative: 1 %
Basophils Relative: 1 %
Eosinophils Absolute: 0 10*3/uL (ref 0.0–0.5)
Eosinophils Absolute: 0 10*3/uL (ref 0.0–0.5)
Eosinophils Relative: 0 %
Eosinophils Relative: 0 %
HCT: 38.4 % (ref 36.0–46.0)
HCT: 38 % (ref 36.0–46.0)
Hemoglobin: 12.6 g/dL (ref 12.0–15.0)
Hemoglobin: 12.8 g/dL (ref 12.0–15.0)
Immature Granulocytes: 9 %
Immature Granulocytes: 8 %
Lymphocytes Relative: 3 %
Lymphocytes Relative: 10 %
Lymphs Abs: 0.5 10*3/uL — ABNORMAL LOW (ref 0.7–4.0)
Lymphs Abs: 1.2 10*3/uL (ref 0.7–4.0)
MCH: 27 pg (ref 26.0–34.0)
MCH: 27.2 pg (ref 26.0–34.0)
MCHC: 32.8 g/dL (ref 30.0–36.0)
MCHC: 33.7 g/dL (ref 30.0–36.0)
MCV: 82.4 fL (ref 80.0–100.0)
MCV: 80.9 fL (ref 80.0–100.0)
Monocytes Absolute: 0.2 10*3/uL (ref 0.1–1.0)
Monocytes Absolute: 0.3 10*3/uL (ref 0.1–1.0)
Monocytes Relative: 2 %
Monocytes Relative: 3 %
Neutro Abs: 11.7 10*3/uL — ABNORMAL HIGH (ref 1.7–7.7)
Neutro Abs: 9.6 10*3/uL — ABNORMAL HIGH (ref 1.7–7.7)
Neutrophils Relative %: 85 %
Neutrophils Relative %: 78 %
Platelets: 221 10*3/uL (ref 150–400)
Platelets: 250 10*3/uL (ref 150–400)
RBC: 4.66 MIL/uL (ref 3.87–5.11)
RBC: 4.7 MIL/uL (ref 3.87–5.11)
RDW: 13.8 % (ref 11.5–15.5)
RDW: 13.7 % (ref 11.5–15.5)
Smear Review: NORMAL
WBC: 13.7 10*3/uL — ABNORMAL HIGH (ref 4.0–10.5)
WBC: 11.8 10*3/uL — ABNORMAL HIGH (ref 4.0–10.5)
nRBC: 0 % (ref 0.0–0.2)
nRBC: 0 % (ref 0.0–0.2)

## 2019-03-24 LAB — LIPASE, BLOOD: Lipase: 23 U/L (ref 11–51)

## 2019-03-24 LAB — COMPREHENSIVE METABOLIC PANEL
ALT: 29 U/L (ref 0–44)
ALT: 31 U/L (ref 0–44)
AST: 29 U/L (ref 15–41)
AST: 27 U/L (ref 15–41)
Albumin: 4.1 g/dL (ref 3.5–5.0)
Albumin: 4.6 g/dL (ref 3.5–5.0)
Alkaline Phosphatase: 126 U/L (ref 38–126)
Alkaline Phosphatase: 142 U/L — ABNORMAL HIGH (ref 38–126)
Anion gap: 12 (ref 5–15)
Anion gap: 15 (ref 5–15)
BUN: 19 mg/dL (ref 8–23)
BUN: 17 mg/dL (ref 8–23)
CO2: 21 mmol/L — ABNORMAL LOW (ref 22–32)
CO2: 22 mmol/L (ref 22–32)
Calcium: 9 mg/dL (ref 8.9–10.3)
Calcium: 9.3 mg/dL (ref 8.9–10.3)
Chloride: 99 mmol/L (ref 98–111)
Chloride: 95 mmol/L — ABNORMAL LOW (ref 98–111)
Creatinine, Ser: 0.89 mg/dL (ref 0.44–1.00)
Creatinine, Ser: 0.81 mg/dL (ref 0.44–1.00)
GFR calc Af Amer: >60 mL/min (ref >60)
GFR calc Af Amer: >60 mL/min (ref >60)
GFR calc non Af Amer: >60 mL/min (ref >60)
GFR calc non Af Amer: >60 mL/min (ref >60)
Glucose, Bld: 204 mg/dL — ABNORMAL HIGH (ref 70–99)
Glucose, Bld: 168 mg/dL — ABNORMAL HIGH (ref 70–99)
Potassium: 2.7 mmol/L — CL (ref 3.5–5.1)
Potassium: 2.9 mmol/L — ABNORMAL LOW (ref 3.5–5.1)
Sodium: 132 mmol/L — ABNORMAL LOW (ref 135–145)
Sodium: 132 mmol/L — ABNORMAL LOW (ref 135–145)
Total Bilirubin: 0.6 mg/dL (ref 0.3–1.2)
Total Bilirubin: 0.8 mg/dL (ref 0.3–1.2)
Total Protein: 7.7 g/dL (ref 6.5–8.1)
Total Protein: 8.3 g/dL — ABNORMAL HIGH (ref 6.5–8.1)

## 2019-03-24 LAB — URINALYSIS, COMPLETE (UACMP) WITH MICROSCOPIC
Bilirubin Urine: NEGATIVE
Glucose, UA: NEGATIVE mg/dL
Hgb urine dipstick: NEGATIVE
Ketones, ur: NEGATIVE mg/dL
Nitrite: NEGATIVE
Protein, ur: NEGATIVE mg/dL
Specific Gravity, Urine: 1.045 — ABNORMAL HIGH (ref 1.005–1.030)
pH: 5 (ref 5.0–8.0)

## 2019-03-24 LAB — LACTIC ACID, PLASMA
Lactic Acid, Venous: 3.9 mmol/L (ref 0.5–1.9)
Lactic Acid, Venous: 2.7 mmol/L (ref 0.5–1.9)

## 2019-03-24 MED ORDER — PRAVASTATIN SODIUM 20 MG PO TABS
40.0000 mg | ORAL_TABLET | Freq: Every day | ORAL | Status: DC
  Administered 2019-03-25 – 2019-03-26 (×2): 40 mg via ORAL
  Filled 2019-03-24 (×2): qty 2

## 2019-03-24 MED ORDER — POTASSIUM CHLORIDE 10 MEQ/100ML IV SOLN
10.0000 meq | Freq: Once | INTRAVENOUS | Status: AC
  Administered 2019-03-24: 10 meq via INTRAVENOUS
  Filled 2019-03-24: qty 100

## 2019-03-24 MED ORDER — SERTRALINE HCL 50 MG PO TABS
200.0000 mg | ORAL_TABLET | Freq: Every day | ORAL | Status: DC
  Administered 2019-03-25 – 2019-03-26 (×3): 200 mg via ORAL
  Filled 2019-03-24 (×3): qty 4

## 2019-03-24 MED ORDER — POTASSIUM CHLORIDE 10 MEQ/100ML IV SOLN
10.0000 meq | INTRAVENOUS | Status: AC
  Administered 2019-03-25 (×2): 10 meq via INTRAVENOUS
  Filled 2019-03-24 (×2): qty 100

## 2019-03-24 MED ORDER — SODIUM CHLORIDE 0.9 % IV SOLN
1.0000 g | Freq: Once | INTRAVENOUS | Status: AC
  Administered 2019-03-24: 20:00:00 1 g via INTRAVENOUS
  Filled 2019-03-24: qty 10

## 2019-03-24 MED ORDER — ONDANSETRON HCL 4 MG/2ML IJ SOLN
4.0000 mg | Freq: Once | INTRAMUSCULAR | Status: AC
  Administered 2019-03-24: 21:00:00 4 mg via INTRAVENOUS
  Filled 2019-03-24: qty 2

## 2019-03-24 MED ORDER — CLONIDINE HCL 0.1 MG PO TABS
0.3000 mg | ORAL_TABLET | Freq: Two times a day (BID) | ORAL | Status: DC
  Administered 2019-03-25 – 2019-03-27 (×6): 0.3 mg via ORAL
  Filled 2019-03-24 (×6): qty 3

## 2019-03-24 MED ORDER — PANTOPRAZOLE SODIUM 40 MG PO TBEC
40.0000 mg | DELAYED_RELEASE_TABLET | Freq: Every day | ORAL | Status: DC
  Administered 2019-03-25 – 2019-03-27 (×3): 40 mg via ORAL
  Filled 2019-03-24 (×3): qty 1

## 2019-03-24 MED ORDER — AMLODIPINE BESYLATE 10 MG PO TABS
10.0000 mg | ORAL_TABLET | Freq: Every day | ORAL | Status: DC
  Administered 2019-03-25 – 2019-03-26 (×2): 10 mg via ORAL
  Filled 2019-03-24 (×3): qty 1

## 2019-03-24 MED ORDER — TRAZODONE HCL 50 MG PO TABS
25.0000 mg | ORAL_TABLET | Freq: Every evening | ORAL | Status: DC | PRN
  Administered 2019-03-25: 23:00:00 25 mg via ORAL
  Administered 2019-03-26: 22:00:00 50 mg via ORAL
  Filled 2019-03-24 (×2): qty 1

## 2019-03-24 MED ORDER — SODIUM CHLORIDE 0.9 % IV SOLN
INTRAVENOUS | Status: DC
  Administered 2019-03-24: 23:00:00 via INTRAVENOUS

## 2019-03-24 MED ORDER — HYDRALAZINE HCL 50 MG PO TABS
50.0000 mg | ORAL_TABLET | Freq: Once | ORAL | Status: AC
  Administered 2019-03-24: 50 mg via ORAL
  Filled 2019-03-24: qty 1

## 2019-03-24 MED ORDER — POTASSIUM CHLORIDE CRYS ER 20 MEQ PO TBCR
10.0000 meq | EXTENDED_RELEASE_TABLET | Freq: Every day | ORAL | Status: DC
  Administered 2019-03-25: 10 meq via ORAL
  Filled 2019-03-24: qty 1

## 2019-03-24 MED ORDER — OXYBUTYNIN CHLORIDE 5 MG PO TABS
5.0000 mg | ORAL_TABLET | Freq: Two times a day (BID) | ORAL | Status: DC
  Administered 2019-03-25 – 2019-03-27 (×5): 5 mg via ORAL
  Filled 2019-03-24 (×6): qty 1

## 2019-03-24 MED ORDER — TERAZOSIN HCL 1 MG PO CAPS
1.0000 mg | ORAL_CAPSULE | Freq: Every day | ORAL | Status: DC
  Administered 2019-03-25 – 2019-03-26 (×2): 1 mg via ORAL
  Filled 2019-03-24 (×3): qty 1

## 2019-03-24 MED ORDER — MEGESTROL ACETATE 400 MG/10ML PO SUSP
400.0000 mg | Freq: Every day | ORAL | Status: DC
  Administered 2019-03-25 – 2019-03-27 (×3): 400 mg via ORAL
  Filled 2019-03-24 (×4): qty 10

## 2019-03-24 MED ORDER — HYDRALAZINE HCL 50 MG PO TABS
100.0000 mg | ORAL_TABLET | Freq: Three times a day (TID) | ORAL | Status: DC
  Administered 2019-03-25 – 2019-03-26 (×5): 100 mg via ORAL
  Filled 2019-03-24 (×7): qty 2

## 2019-03-24 MED ORDER — IOHEXOL 300 MG/ML  SOLN
100.0000 mL | Freq: Once | INTRAMUSCULAR | Status: AC | PRN
  Administered 2019-03-24: 100 mL via INTRAVENOUS

## 2019-03-24 MED ORDER — MORPHINE SULFATE (PF) 4 MG/ML IV SOLN
4.0000 mg | Freq: Once | INTRAVENOUS | Status: AC
  Administered 2019-03-24: 21:00:00 4 mg via INTRAVENOUS
  Filled 2019-03-24: qty 1

## 2019-03-24 MED ORDER — MONTELUKAST SODIUM 10 MG PO TABS
10.0000 mg | ORAL_TABLET | Freq: Every evening | ORAL | Status: DC | PRN
  Filled 2019-03-24: qty 1

## 2019-03-24 MED ORDER — METHOCARBAMOL 500 MG PO TABS
500.0000 mg | ORAL_TABLET | Freq: Every evening | ORAL | Status: DC | PRN
  Administered 2019-03-27: 500 mg via ORAL
  Filled 2019-03-24 (×3): qty 1

## 2019-03-24 MED ORDER — METOPROLOL SUCCINATE ER 50 MG PO TB24
100.0000 mg | ORAL_TABLET | Freq: Every day | ORAL | Status: DC
  Administered 2019-03-25 – 2019-03-27 (×3): 100 mg via ORAL
  Filled 2019-03-24 (×3): qty 2

## 2019-03-24 MED ORDER — OLANZAPINE 10 MG PO TABS
10.0000 mg | ORAL_TABLET | Freq: Every day | ORAL | Status: DC
  Administered 2019-03-25 – 2019-03-26 (×2): 10 mg via ORAL
  Filled 2019-03-24 (×3): qty 1

## 2019-03-24 MED ORDER — SODIUM CHLORIDE 0.9 % IV BOLUS
1000.0000 mL | Freq: Once | INTRAVENOUS | Status: AC
  Administered 2019-03-24: 20:00:00 1000 mL via INTRAVENOUS

## 2019-03-24 MED ORDER — MIRTAZAPINE 15 MG PO TABS
15.0000 mg | ORAL_TABLET | Freq: Every day | ORAL | Status: DC
  Administered 2019-03-25 – 2019-03-26 (×3): 15 mg via ORAL
  Filled 2019-03-24 (×3): qty 1

## 2019-03-24 MED ORDER — SODIUM CHLORIDE 0.9 % IV SOLN
1.0000 g | INTRAVENOUS | Status: DC
  Administered 2019-03-24 – 2019-03-26 (×3): 1 g via INTRAVENOUS
  Filled 2019-03-24: qty 10
  Filled 2019-03-24 (×2): qty 1

## 2019-03-24 NOTE — Telephone Encounter (Signed)
I called and spoke with patient and she agreed to call Dr Dahlia Byes, if he cannot see her, she will call me back

## 2019-03-24 NOTE — ED Triage Notes (Addendum)
Patient reports she had a chemo treatment and radiation treatment last week for colon cancer. States since then, she has had worsening abdominal pain, nausea and vomiting. States this is the first time she has had this response to a treatment. Denies contact with anyone who has been sick.

## 2019-03-24 NOTE — Telephone Encounter (Signed)
Patient called reporting she is in extreme pain in her stomach, asking for something to be done for her as she "can't bare it". She is scheduled for radiation therapy at 215 today.Please advise

## 2019-03-24 NOTE — H&P (Signed)
[place] [facility] [place] NAME: Gloria Allen    MR#:  [id number]  DATE OF BIRTH:  1945/12/14  DATE OF ADMISSION:  03/24/2019  PRIMARY CARE PHYSICIAN: [facility] [place]   REQUESTING/REFERRING PHYSICIAN: Lenise Arena, MD  CHIEF COMPLAINT:   Chief Complaint  Patient presents with  . Abdominal Pain  . Nausea    HISTORY OF PRESENT ILLNESS:  Gloria Allen  is a 73 y.o. female with a known history of colon and small cell lung cancer being treated with IV chemotherapy and radiation therapy.  Patient presented from the cancer center complaining of suprapubic pain, dysuria, with urinary frequency and subjective fevers.  She was noted to have elevated white blood cell count at the cancer center with concern for urinary tract infection.  She was also noted to be hypokalemic.  She denies experiencing chest pain.  She has experienced some nausea, no vomiting.  She denies diarrhea or constipation.  She denies hematuria.  She denies increased shortness of breath.  On arrival to the emergency room potassium was 2.9 with WBC 11.8.  Patient also has elevated lactic acid of 3.9.  CT chest/abdomen showed no acute findings.  She received 1 L normal saline in the emergency room currently with normal saline infusing to peripheral IV at 125 cc/h.  Patient also received IV potassium replacement.  Initial dose of Rocephin was given in the emergency room for diagnosis urinary tract infection.  PAST MEDICAL HISTORY:   Past Medical History:  Diagnosis Date  . Anxiety   . Arthritis   . Asthma   . Cancer ([place])   . Cervical central spinal stenosis (C3-C7) (worse at C4-5) 07/30/2017  . Cervical foraminal stenosis (C4-5 and C5-6) (Bilateral) (L>R) 07/30/2017  . Chronic lower extremity pain (Fourth Area of Pain) (Bilateral) (R>L) 06/11/2017  . Chronic neck pain (Primary Area of Pain) (Bilateral) (R>L) 06/11/2017  . Chronic sacroiliac joint pain (Bilateral) (R>L)  06/11/2017  . Chronic shoulder pain [facility] Area of Pain) (Right) 06/11/2017  . Chronic upper extremity pain (Fifth Area of Pain) (Bilateral) (R>L) 06/11/2017  . Colon cancer ([place])   . DDD (degenerative disc disease), cervical 07/30/2017  . DDD (degenerative disc disease), lumbar 07/30/2017  . Depression   . DISH (diffuse idiopathic skeletal hyperostosis) 07/30/2017  . Dyspnea    with exertion  . Entrapment syndrome 06/20/2017   2002 on R and 2010 on L  . Full thickness rotator cuff tear 06/12/2017  . GERD (gastroesophageal reflux disease)   . Grade 1 Anterolisthesis of L4 over L5 07/30/2017  . Headache   . Hx: UTI (urinary tract infection)   . Hypertension   . Inflammation of joint of shoulder region 01/15/2017  . Lumbar central spinal stenosis (L4-5) 07/30/2017  . Lumbar disc protrusion (Left: L5-S1) (Right: L1-2) 07/30/2017   L5-S1 left foraminal protrusion with L5 impingement. L1-2 right paracentral protrusion without impingement.  . Lumbar facet arthropathy (Bilateral) 07/30/2017  . Lumbar facet syndrome (Bilateral) (R>L) 07/30/2017  . Lung cancer ([place])   . Osteoarthritis of shoulder (Right) 01/15/2017    PAST SURGICAL HISTORY:   Past Surgical History:  Procedure Laterality Date  . ABDOMINAL HYSTERECTOMY    . APPENDECTOMY    . BREAST REDUCTION SURGERY Bilateral   . COLONOSCOPY WITH PROPOFOL N/A 11/05/2018   Procedure: COLONOSCOPY WITH PROPOFOL;  Surgeon: Virgel Manifold, MD;  Location: [facility] ENDOSCOPY;  Service: Endoscopy;  Laterality: N/A;  . COLONOSCOPY WITH PROPOFOL N/A 11/06/2018  Procedure: COLONOSCOPY WITH PROPOFOL;  Surgeon: Virgel Manifold, MD;  Location: [facility] ENDOSCOPY;  Service: Endoscopy;  Laterality: N/A;  . COLOSTOMY    . DILATION AND CURETTAGE OF UTERUS    . ESOPHAGOGASTRODUODENOSCOPY (EGD) WITH PROPOFOL N/A 11/05/2018   Procedure: ESOPHAGOGASTRODUODENOSCOPY (EGD) WITH PROPOFOL;  Surgeon: Virgel Manifold, MD;  Location: [facility] ENDOSCOPY;  Service:  Endoscopy;  Laterality: N/A;  . ILEOSTOMY Right 11/27/2018   Procedure: ILEOSTOMY;  Surgeon: Jules Husbands, MD;  Location: [facility] ORS;  Service: General;  Laterality: Right;  . LAPAROSCOPIC SIGMOID COLECTOMY N/A 11/27/2018   Procedure: LAPAROSCOPIC SIGMOID COLECTOMY;  Surgeon: Jules Husbands, MD;  Location: [facility] ORS;  Service: General;  Laterality: N/A;  . ORIF ANKLE FRACTURE Right 11/21/2018   Procedure: OPEN REDUCTION INTERNAL FIXATION (ORIF) ANKLE FRACTURE;  Surgeon: Corky Mull, MD;  Location: [facility] ORS;  Service: Orthopedics;  Laterality: Right;  . PORTACATH PLACEMENT N/A 03/12/2019   Procedure: INSERTION PORT-A-CATH;  Surgeon: Jules Husbands, MD;  Location: [facility] ORS;  Service: General;  Laterality: N/A;  . REDUCTION MAMMAPLASTY    . SHOULDER ARTHROSCOPY WITH OPEN ROTATOR CUFF REPAIR AND DISTAL CLAVICLE ACROMINECTOMY Right 03/26/2017   Procedure: right shoulder arthroscopy, arthroscopic subacromial decompression, distal clavicle excision, mini open rotator cuff repair;  Surgeon: Thornton Park, MD;  Location: [facility] ORS;  Service: Orthopedics;  Laterality: Right;    SOCIAL HISTORY:   Social History   Tobacco Use  . Smoking status: Former Smoker    Packs/day: 1.00    Types: Cigarettes    Quit date: 2010    Years since quitting: 10.4  . Smokeless tobacco: Never Used  . Tobacco comment: quit over 30 years ago   Substance Use Topics  . Alcohol use: No    FAMILY HISTORY:   Family History  Problem Relation Age of Onset  . Hypertension Mother   . Breast cancer Mother 62  . Breast cancer Sister 47    DRUG ALLERGIES:   Allergies  Allergen Reactions  . Ace Inhibitors Swelling  . Angiotensin Receptor Blockers Swelling  . Sulfa Antibiotics Itching  . Chlorthalidone Rash  . Flexeril [Cyclobenzaprine] Rash    REVIEW OF SYSTEMS:   Review of Systems  Constitutional: Positive for chills, fever and malaise/fatigue.  HENT: Negative for congestion and sore throat.   Eyes: Positive  for double vision. Negative for blurred vision.  Respiratory: Negative for cough, shortness of breath and wheezing.   Cardiovascular: Negative for chest pain, palpitations and leg swelling.  Gastrointestinal: Negative for abdominal pain, constipation, diarrhea, nausea and vomiting.  Genitourinary: Positive for dysuria, frequency and urgency. Negative for flank pain and hematuria.  Musculoskeletal: Negative for back pain, falls, joint pain and myalgias.  Skin: Negative.  Negative for itching and rash.  Neurological: Negative for dizziness, loss of consciousness, weakness and headaches.  Psychiatric/Behavioral: Negative.  Negative for depression.   MEDICATIONS AT HOME:   Prior to Admission medications   Medication Sig Start Date End Date Taking? Authorizing Provider  albuterol (PROVENTIL HFA;VENTOLIN HFA) 108 (90 Base) MCG/ACT inhaler Inhale 2 puffs into the lungs every 6 (six) hours as needed for wheezing or shortness of breath. 04/02/18  Yes Kathrine Haddock, NP  amLODipine (NORVASC) 10 MG tablet Take 1 tablet by mouth daily.  01/19/19  Yes [provider]  BREO ELLIPTA 200-25 MCG/INH AEPB Inhale 1 puff into the lungs daily.  08/11/18  Yes [provider]  clonazePAM (KLONOPIN) 0.5 MG tablet Take 0.5 mg by mouth 3 (three) times  daily.    Yes [provider]  cloNIDine (CATAPRES) 0.3 MG tablet Take 2 tablets (0.6 mg total) by mouth 2 (two) times daily. Patient taking differently: Take 0.3 mg by mouth 2 (two) times daily.  05/19/18  Yes Volney American, PA-C  hydrALAZINE (APRESOLINE) 100 MG tablet Take 1 tablet (100 mg total) by mouth 3 (three) times daily. 04/22/18  Yes Johnson, Megan P, DO  hydrOXYzine (ATARAX/VISTARIL) 10 MG tablet TAKE 1 TABLET BY MOUTH THREE TIMES A DAY AS NEEDED Patient taking differently: Take 10 mg by mouth 3 (three) times daily as needed for itching.  06/22/18  Yes Johnson, Megan P, DO  lidocaine-prilocaine (EMLA) cream Apply to affected area  once 02/27/19  Yes Sindy Guadeloupe, MD  loperamide (IMODIUM) 2 MG capsule Take 2 capsules (4 mg total) by mouth 3 (three) times daily. 03/12/19  Yes Pabon, Diego F, MD  LORazepam (ATIVAN) 0.5 MG tablet Take 1 tablet (0.5 mg total) by mouth every 8 (eight) hours as needed for anxiety. 02/24/19  Yes Sindy Guadeloupe, MD  lovastatin (MEVACOR) 40 MG tablet Take 1 tablet (40 mg total) by mouth at bedtime. 04/24/18  Yes Johnson, Megan P, DO  meclizine (ANTIVERT) 12.5 MG tablet Take 12.5 mg by mouth 3 (three) times daily as needed for dizziness.  08/20/18  Yes [provider]  megestrol (MEGACE) 400 MG/10ML suspension Take 10 mLs (400 mg total) by mouth daily for 30 doses. 03/12/19 04/11/19 Yes Pabon, [place], MD  methocarbamol (ROBAXIN) 500 MG tablet Take 1 tablet (500 mg total) by mouth at bedtime as needed for muscle spasms. 05/19/18  Yes Volney American, PA-C  metoprolol succinate (TOPROL-XL) 100 MG 24 hr tablet TAKE 1 TABLET BY MOUTH DAILY 06/22/18  Yes Johnson, Megan P, DO  mirtazapine (REMERON) 15 MG tablet take 1 tablet by mouth at bedtime for APPETITIE Patient taking differently: Take 15 mg by mouth at bedtime. take 1 tablet by mouth at bedtime for APPETITIE 04/24/18  Yes Johnson, Megan P, DO  montelukast (SINGULAIR) 10 MG tablet Take 1 tablet (10 mg total) by mouth at bedtime as needed. 04/02/18  Yes Kathrine Haddock, NP  OLANZapine (ZYPREXA) 10 MG tablet Take 1 tablet (10 mg total) by mouth at bedtime. 03/17/19  Yes Sindy Guadeloupe, MD  omeprazole (PRILOSEC) 40 MG capsule Take 1 capsule (40 mg total) by mouth 2 (two) times daily. 04/24/18  Yes Johnson, Megan P, DO  ondansetron (ZOFRAN) 8 MG tablet Take 1 tablet (8 mg total) by mouth 2 (two) times daily as needed for refractory nausea / vomiting. Start on day 3 after carboplatin chemo. 02/27/19  Yes Sindy Guadeloupe, MD  oxybutynin (DITROPAN) 5 MG tablet TAKE 1 TABLET BY MOUTH TWICE DAILY 08/25/18  Yes Johnson, Megan P, DO  oxyCODONE-acetaminophen  (PERCOCET/ROXICET) 5-325 MG tablet Take 1 tablet by mouth every 6 (six) hours as needed for severe pain. 02/16/19  Yes Pabon, [place], MD  Potassium 99 MG TABS Take 1 tablet by mouth daily as needed (cramps).    Yes [provider]  prochlorperazine (COMPAZINE) 10 MG tablet Take 1 tablet (10 mg total) by mouth every 6 (six) hours as needed (Nausea or vomiting). 02/27/19  Yes Sindy Guadeloupe, MD  sertraline (ZOLOFT) 100 MG tablet Take 2 tablets (200 mg total) by mouth at bedtime. 04/24/18  Yes Johnson, Megan P, DO  terazosin (HYTRIN) 5 MG capsule TAKE 1 CAPSULE BY MOUTH ONCE DAILY AT BEDTIME 06/22/18  Yes Johnson,  Megan P, DO  traZODone (DESYREL) 50 MG tablet Take 0.5-1 tablets (25-50 mg total) by mouth at bedtime as needed for sleep. 04/24/18  Yes Johnson, Megan P, DO  triamcinolone cream (KENALOG) 0.1 % APPLY TOPICALLY TWICE DAILY AS NEEDED FOR ITCHING Patient taking differently: Apply 1 application topically 2 (two) times daily as needed (itching).  06/22/18  Yes Johnson, Megan P, DO  ANORO ELLIPTA 62.5-25 MCG/INH AEPB INHALE 1 PUFF BY MOUTH DAILY Patient not taking: Reported on 03/17/2019 08/25/18   Park Liter P, DO  HYDROcodone-acetaminophen (NORCO/VICODIN) 5-325 MG tablet Take 1 tablet by mouth every 6 (six) hours as needed for moderate pain. Patient not taking: Reported on 03/17/2019 03/12/19   Caroleen Hamman F, MD      VITAL SIGNS:  Blood pressure (!) 200/98, pulse 99, temperature 99.5 F (37.5 C), temperature source Oral, resp. rate 18, height 5\' 5"  (1.651 m), weight 66.7 kg, SpO2 98 %.  PHYSICAL EXAMINATION:  Physical Exam  GENERAL:  73 y.o.-year-old patient lying in the bed with no acute distress.  EYES: Pupils equal, round, reactive to light and accommodation. No scleral icterus. Extraocular muscles intact.  HEENT: Head atraumatic, normocephalic. Oropharynx and nasopharynx clear.  NECK:  Supple, no jugular venous distention. No thyroid enlargement, no tenderness.  LUNGS: Normal  breath sounds bilaterally, no wheezing, rales,rhonchi or crepitation. No use of accessory muscles of respiration.  CARDIOVASCULAR: Regular rate and rhythm, S1, S2 normal. No murmurs, rubs, or gallops.  ABDOMEN: Soft, nondistended, lower abdominal tenderness.Right lower quadrant colostomy Bowel sounds present. No organomegaly or mass.  EXTREMITIES: No pedal edema, cyanosis, or clubbing.  NEUROLOGIC: Cranial nerves II through XII are intact. Muscle strength 5/5 in all extremities. Sensation intact. Gait not checked.  PSYCHIATRIC: The patient is alert and oriented x 3.  Normal affect and good eye contact. SKIN: No obvious rash, lesion, or ulcer.   LABORATORY PANEL:   CBC Recent Labs  Lab 03/24/19 1953  WBC 11.8*  HGB 12.8  HCT 38.0  PLT 250   ------------------------------------------------------------------------------------------------------------------  Chemistries  Recent Labs  Lab 03/24/19 1953  NA 132*  K 2.9*  CL 95*  CO2 22  GLUCOSE 168*  BUN 17  CREATININE 0.81  CALCIUM 9.3  AST 27  ALT 31  ALKPHOS 142*  BILITOT 0.8   ------------------------------------------------------------------------------------------------------------------  Cardiac Enzymes No results for input(s): TROPONINI in the last 168 hours. ------------------------------------------------------------------------------------------------------------------  RADIOLOGY:  Ct Chest W Contrast  Result Date: 03/24/2019 CLINICAL DATA:  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 Diagnosis May 2020. Currently undergoing chemotherapy and radiation. EXAM: CT CHEST, ABDOMEN, AND PELVIS WITH CONTRAST TECHNIQUE: Multidetector CT imaging of the chest, abdomen and pelvis was performed following the standard protocol during bolus administration of intravenous contrast. CONTRAST:  173mL OMNIPAQUE IOHEXOL 300 MG/ML  SOLN COMPARISON:  PET-CT 02/05/2019 FINDINGS: CT CHEST FINDINGS Cardiovascular: The heart  is normal in size. No pericardial effusion. The aorta is normal in caliber. Moderate atherosclerotic calcifications. No dissection. The branch vessels are patent. Stable coronary artery calcifications. Mediastinum/Nodes: No mediastinal or hilar mass or lymphadenopathy. The esophagus is unremarkable. Lungs/Pleura: Stable underlying emphysematous changes. The right upper lobe pulmonary lesion measures 7.5 mm on image number 58 and previously measured 15.5 mm. Semi solid nodular left upper lobe lesion peripherally on image number 85 measures 18 mm and is unchanged. This was not hypermetabolic on the recent PET-CT but recommend continued observation/surveillance. A few tiny scattered subpleural nodules are stable and likely represent lymph nodes. No infiltrates  or effusions. Musculoskeletal: No breast masses, supraclavicular or axillary adenopathy. The right-sided Port-A-Cath is stable. The thyroid gland is unremarkable. Stable small right nodule. No significant bony findings. CT ABDOMEN PELVIS FINDINGS Hepatobiliary: Stable low-attenuation lesion in segment 6 most consistent with a benign hepatic cyst. No worrisome hepatic lesions or intrahepatic biliary dilatation. The gallbladder is surgically absent. Mild associated common bile duct dilatation. Pancreas: No mass, inflammation or ductal dilatation. Spleen: Normal size.  No focal lesions. Adrenals/Urinary Tract: The adrenal glands are normal in stable. No renal, ureteral or bladder calculi or mass. Stomach/Bowel: The stomach, duodenum, small bowel and colon are unremarkable. No acute inflammatory changes, mass lesions or obstructive findings. Patient has a loop ileostomy in the right lower quadrant. Prior left hemicolectomy with transverse colon to rectal anastomosis. There is dense contrast in the rectum with moderate artifact but no findings suspicious for residual or recurrent tumor. Vascular/Lymphatic: Stable aortic and iliac artery calcifications but no aneurysm.  The major venous structures are patent. No mesenteric or retroperitoneal mass or adenopathy. No pelvic lymphadenopathy. Reproductive: The uterus is surgically absent. Both ovaries are still present and appear normal. There is a simple appearing cyst associated with the left ovary. Other: No free pelvic fluid collections or pelvic abscess. No inguinal mass or adenopathy. No subcutaneous lesions. Musculoskeletal: No significant bony findings. IMPRESSION: 1. Interval decrease in size of the right upper lobe pulmonary nodule now measuring 7.5 mm and previously measuring 15.5 mm. 2. Stable 18 mm semi-solid airspace nodule in the left upper lobe. Recommend continued surveillance. 3. No mediastinal or hilar mass or adenopathy. 4. Stable surgical changes involving the colon. No findings suspicious for recurrent tumor, locoregional adenopathy or distant metastatic disease. Electronically Signed   By: Marijo Sanes M.D.   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 Diagnosis May 2020. Currently undergoing chemotherapy and radiation. EXAM: CT CHEST, ABDOMEN, AND PELVIS WITH CONTRAST TECHNIQUE: Multidetector CT imaging of the chest, abdomen and pelvis was performed following the standard protocol during bolus administration of intravenous contrast. CONTRAST:  171mL OMNIPAQUE IOHEXOL 300 MG/ML  SOLN COMPARISON:  PET-CT 02/05/2019 FINDINGS: CT CHEST FINDINGS Cardiovascular: The heart is normal in size. No pericardial effusion. The aorta is normal in caliber. Moderate atherosclerotic calcifications. No dissection. The branch vessels are patent. Stable coronary artery calcifications. Mediastinum/Nodes: No mediastinal or hilar mass or lymphadenopathy. The esophagus is unremarkable. Lungs/Pleura: Stable underlying emphysematous changes. The right upper lobe pulmonary lesion measures 7.5 mm on image number 58 and previously  measured 15.5 mm. Semi solid nodular left upper lobe lesion peripherally on image number 85 measures 18 mm and is unchanged. This was not hypermetabolic on the recent PET-CT but recommend continued observation/surveillance. A few tiny scattered subpleural nodules are stable and likely represent lymph nodes. No infiltrates or effusions. Musculoskeletal: No breast masses, supraclavicular or axillary adenopathy. The right-sided Port-A-Cath is stable. The thyroid gland is unremarkable. Stable small right nodule. No significant bony findings. CT ABDOMEN PELVIS FINDINGS Hepatobiliary: Stable low-attenuation lesion in segment 6 most consistent with a benign hepatic cyst. No worrisome hepatic lesions or intrahepatic biliary dilatation. The gallbladder is surgically absent. Mild associated common bile duct dilatation. Pancreas: No mass, inflammation or ductal dilatation. Spleen: Normal size.  No focal lesions. Adrenals/Urinary Tract: The adrenal glands are normal in stable. No renal, ureteral or bladder calculi or mass. Stomach/Bowel: The stomach, duodenum, small bowel and colon are  unremarkable. No acute inflammatory changes, mass lesions or obstructive findings. Patient has a loop ileostomy in the right lower quadrant. Prior left hemicolectomy with transverse colon to rectal anastomosis. There is dense contrast in the rectum with moderate artifact but no findings suspicious for residual or recurrent tumor. Vascular/Lymphatic: Stable aortic and iliac artery calcifications but no aneurysm. The major venous structures are patent. No mesenteric or retroperitoneal mass or adenopathy. No pelvic lymphadenopathy. Reproductive: The uterus is surgically absent. Both ovaries are still present and appear normal. There is a simple appearing cyst associated with the left ovary. Other: No free pelvic fluid collections or pelvic abscess. No inguinal mass or adenopathy. No subcutaneous lesions. Musculoskeletal: No significant bony  findings. IMPRESSION: 1. Interval decrease in size of the right upper lobe pulmonary nodule now measuring 7.5 mm and previously measuring 15.5 mm. 2. Stable 18 mm semi-solid airspace nodule in the left upper lobe. Recommend continued surveillance. 3. No mediastinal or hilar mass or adenopathy. 4. Stable surgical changes involving the colon. No findings suspicious for recurrent tumor, locoregional adenopathy or distant metastatic disease. Electronically Signed   By: Marijo Sanes M.D.   On: 03/24/2019 15:26      IMPRESSION AND PLAN:   1.  Sepsis - Likely secondary to urinary tract infection - Patient received 1 L normal saline bolus in the emergency room currently with normal saline infusing to peripheral IV at 125 cc/h -Patient was started on IV antibiotic therapy with Rocephin - Urine culture is pending -Blood cultures pending  2.  Urinary tract infection - IV Rocephin - Urine culture pending - We will adjust therapy based on culture results  3.  Hypokalemia - Patient received IV potassium replacement - Repeat BMP in the a.m. - Telemetry monitoring  4: Colon cancer and small cell lung cancer - Patient is being followed by the cancer center receiving IV chemotherapy and radiation therapy  5.  Hypertension - Norvasc, hydralazine, and clonidine continued - We will treat persistent hypertension expectantly  DVT and PPI prophylaxis initiated   All the records are reviewed and case discussed with ED provider. The plan of care was discussed in details with the patient (and family). I answered all questions. The patient agreed to proceed with the above mentioned plan. Further management will depend upon hospital course.   CODE STATUS: Full code  TOTAL TIME TAKING CARE OF THIS PATIENT: 45 minutes.    [place] on 03/24/2019 at 11:16 PM  Pager - [phone]  After 6pm go to [email or web address] - [employer] [facility]  Office   [phone]  CC: Primary care physician; [employer]   Note: This dictation was prepared with Diplomatic Services operational officer dictation along with smaller Company secretary. Any transcriptional errors that result from this process are unintentional.

## 2019-03-24 NOTE — ED Notes (Signed)
Date and time results received: 03/24/19 2040 (use smartphrase ".now" to insert current time)  Test: lactic Critical Value: 3.9  Name of Provider Notified: Cutrielle  Orders Received? Or Actions Taken?: Orders Received - See Orders for details

## 2019-03-24 NOTE — Telephone Encounter (Signed)
Lauren asked me to message you and let you know that she thinks this lady's pain is all coming from her ostomy, and she really needs to see her surgeon, Dr. Dahlia Byes. If the patient can't get in to see him, we can she her here

## 2019-03-24 NOTE — Progress Notes (Signed)
Symptom Management [place]  [phone] [phone]  Patient Care Team: Inc, [facility] as PCP - General [place]elf[place] Nab, [place] as Registered Nurse   Name of the patient: Gloria Allen  [id number]  1946/02/16   Date of visit: 03/24/19  Diagnosis- Small Cell Lung Cancer & Colon Cancer  Chief complaint/ Reason for visit- Abdominal Pain & Vomiting  Heme/Onc history:  Oncology History Overview Note  Patient initially presented a 73 year old African-American female who underwent screening colonoscopy by Dr. Bonna Gains.  Colonoscopy showed villous nonobstructing large mass in the sigmoid colon 12 cm proximal to the anus.  This was partially circumferential.  Biopsy showed invasive adenocarcinoma.  Patient was seen by Dr. Dahlia Byes and underwent hemicolectomy with lymph node sampling on 11/27/2018.  Final pathology showed invasive colorectal carcinoma 3.5 cm, grade 2.  Tumor invades through musculoskeletal propria into the peri-colorectal tissue.  Margins negative.  Tumor deposits present, 2.  1 out of 9 lymph nodes examined were positive for malignancy. pT3pN1a  Patient lives alone and has no social support.  Children live around [place] but are not able to take care of her.  Initially she reported fatigue trouble mobilizing around house.  She has fracture of right ankle which limits her mobility and she has chronic pain.  She has colostomy in place.  She underwent CT chest to complete her staging work-up which showed 2 lung nodules, one in the right upper lobe, and one in the left upper lobe this was followed by PET scan which showed the left upper lobe was not hypermetabolic.  The right upper lobe lung nodule measuring 1.6 with an SUV of 6.8.  No hypermetabolic axillary, mediastinal, or hilar lymphadenopathy or evidence of metastatic disease elsewhere.  Patient underwent CT-guided biopsy of the right upper lobe lung nodule which was  consistent with small cell lung cancer positive for CD56 and TTF-1.  She initiated [place] and etoposide on 03/17/2019.   Malignant neoplasm of colon ([place])  12/19/2018 Initial Diagnosis   Malignant neoplasm of colon ([place])   12/19/2018 Cancer Staging   Staging form: Colon and Rectum, AJCC 8th Edition - Pathologic stage from 12/19/2018: Stage IIIB (pT3, pN1a, cM0) - Signed by Sindy Guadeloupe, MD on 12/22/2018   Small cell lung cancer ([place])  02/24/2019 Initial Diagnosis   Small cell lung cancer ([place])   02/24/2019 Cancer Staging   Staging form: Lung, AJCC 8th Edition - Clinical stage from 02/24/2019: cT1, cN0, cM0 - Signed by Sindy Guadeloupe, MD on 02/24/2019   03/17/2019 -  Chemotherapy   The patient had palonosetron (ALOXI) injection 0.25 mg, 0.25 mg, Intravenous,  Once, 1 of 4 cycles Administration: 0.25 mg (03/17/2019) pegfilgrastim (NEULASTA ONPRO KIT) injection 6 mg, 6 mg, Subcutaneous, Once, 1 of 1 cycle Administration: 6 mg (03/19/2019) CARBOplatin (PARAPLATIN) 400 mg in sodium chloride 0.9 % 250 mL chemo infusion, 400 mg (100 % of original dose 404 mg), Intravenous,  Once, 1 of 4 cycles Dose modification:   (original dose 404 mg, Cycle 1) Administration: 400 mg (03/17/2019) etoposide (VEPESID) 180 mg in sodium chloride 0.9 % 500 mL chemo infusion, 100 mg/m2 = 180 mg, Intravenous,  Once, 1 of 4 cycles Administration: 180 mg (03/17/2019), 180 mg (03/18/2019), 180 mg (03/19/2019)  for chemotherapy treatment.      Interval history-[name], 73 year old female with above history of small cell lung cancer status post cycle 1 of carbo etoposide with concurrent radiation on 6/16-6/18, and stage III colon  cancer s/p ileostomy declined adjuvant treatment currently on observation, presents to symptom management clinic for abdominal pain and vomiting.  She states that after leaving cancer center yesterday her abdominal pain intensified.  Pain occurs intermittently, localizes to lower abdomen/pelvis  radiates to mid abdomen.  Rates 10 of 10.  Pain comes and goes.  Associated symptoms: Nausea and vomiting, Urinary frequency. She took zofran and compazine this morning which improved her symptoms. Output through ileostomy has been normal. Urinary output is normal. No dysuria. She is out of pain medication so she hasn't taken any. Nothing seems to make pain better or worse. She says the pain started approximately 2 weeks ago and has gradually worsened in intensity since that time. She has not seen anyone for this pain.   Not currently on blood pressure medications- says she ran out and hasn't been back to pcp. No fever. Chills associated with vomiting. No bleeding, blood in BM, or blood in vomit.   ECOG FS:1 - Symptomatic but completely ambulatory  Review of systems- Review of Systems  Constitutional: Negative for chills, fever, malaise/fatigue and weight loss.  HENT: Negative.   Eyes: Negative.   Respiratory: Negative.  Negative for cough and shortness of breath.   Cardiovascular: Negative.  Negative for chest pain, palpitations and leg swelling.  Gastrointestinal: Positive for abdominal pain, nausea and vomiting. Negative for blood in stool and melena.  Genitourinary: Positive for frequency. Negative for dysuria, flank pain, hematuria and urgency.  Musculoskeletal: Negative for back pain, falls and myalgias.  Skin: Negative.   Neurological: Positive for weakness. Negative for dizziness, loss of consciousness and headaches.  Psychiatric/Behavioral: Positive for depression. The patient is nervous/anxious.      Current treatment- lung cancer: carbo-etoposide & radiation; colon cancer: observation  Allergies  Allergen Reactions  . Ace Inhibitors Swelling  . Angiotensin Receptor Blockers Swelling  . Sulfa Antibiotics Itching  . Chlorthalidone Rash  . Flexeril [Cyclobenzaprine] Rash    Past Medical History:  Diagnosis Date  . Anxiety   . Arthritis   . Asthma   . Cancer ([place])   .  Cervical central spinal stenosis (C3-C7) (worse at C4-5) 07/30/2017  . Cervical foraminal stenosis (C4-5 and C5-6) (Bilateral) (L>R) 07/30/2017  . Chronic lower extremity pain (Fourth Area of Pain) (Bilateral) (R>L) 06/11/2017  . Chronic neck pain (Primary Area of Pain) (Bilateral) (R>L) 06/11/2017  . Chronic sacroiliac joint pain (Bilateral) (R>L) 06/11/2017  . Chronic shoulder pain [facility] Area of Pain) (Right) 06/11/2017  . Chronic upper extremity pain (Fifth Area of Pain) (Bilateral) (R>L) 06/11/2017  . Colon cancer ([place])   . DDD (degenerative disc disease), cervical 07/30/2017  . DDD (degenerative disc disease), lumbar 07/30/2017  . Depression   . DISH (diffuse idiopathic skeletal hyperostosis) 07/30/2017  . Dyspnea    with exertion  . Entrapment syndrome 06/20/2017   2002 on R and 2010 on L  . Full thickness rotator cuff tear 06/12/2017  . GERD (gastroesophageal reflux disease)   . Grade 1 Anterolisthesis of L4 over L5 07/30/2017  . Headache   . Hx: UTI (urinary tract infection)   . Hypertension   . Inflammation of joint of shoulder region 01/15/2017  . Lumbar central spinal stenosis (L4-5) 07/30/2017  . Lumbar disc protrusion (Left: L5-S1) (Right: L1-2) 07/30/2017   L5-S1 left foraminal protrusion with L5 impingement. L1-2 right paracentral protrusion without impingement.  . Lumbar facet arthropathy (Bilateral) 07/30/2017  . Lumbar facet syndrome (Bilateral) (R>L) 07/30/2017  . Lung cancer ([place])   .  Osteoarthritis of shoulder (Right) 01/15/2017    Past Surgical History:  Procedure Laterality Date  . ABDOMINAL HYSTERECTOMY    . APPENDECTOMY    . BREAST REDUCTION SURGERY Bilateral   . COLONOSCOPY WITH PROPOFOL N/A 11/05/2018   Procedure: COLONOSCOPY WITH PROPOFOL;  Surgeon: Virgel Manifold, MD;  Location: [facility] ENDOSCOPY;  Service: Endoscopy;  Laterality: N/A;  . COLONOSCOPY WITH PROPOFOL N/A 11/06/2018   Procedure: COLONOSCOPY WITH PROPOFOL;  Surgeon: Virgel Manifold, MD;   Location: [facility] ENDOSCOPY;  Service: Endoscopy;  Laterality: N/A;  . COLOSTOMY    . DILATION AND CURETTAGE OF UTERUS    . ESOPHAGOGASTRODUODENOSCOPY (EGD) WITH PROPOFOL N/A 11/05/2018   Procedure: ESOPHAGOGASTRODUODENOSCOPY (EGD) WITH PROPOFOL;  Surgeon: Virgel Manifold, MD;  Location: [facility] ENDOSCOPY;  Service: Endoscopy;  Laterality: N/A;  . ILEOSTOMY Right 11/27/2018   Procedure: ILEOSTOMY;  Surgeon: Jules Husbands, MD;  Location: [facility] ORS;  Service: General;  Laterality: Right;  . LAPAROSCOPIC SIGMOID COLECTOMY N/A 11/27/2018   Procedure: LAPAROSCOPIC SIGMOID COLECTOMY;  Surgeon: Jules Husbands, MD;  Location: [facility] ORS;  Service: General;  Laterality: N/A;  . ORIF ANKLE FRACTURE Right 11/21/2018   Procedure: OPEN REDUCTION INTERNAL FIXATION (ORIF) ANKLE FRACTURE;  Surgeon: Corky Mull, MD;  Location: [facility] ORS;  Service: Orthopedics;  Laterality: Right;  . PORTACATH PLACEMENT N/A 03/12/2019   Procedure: INSERTION PORT-A-CATH;  Surgeon: Jules Husbands, MD;  Location: [facility] ORS;  Service: General;  Laterality: N/A;  . REDUCTION MAMMAPLASTY    . SHOULDER ARTHROSCOPY WITH OPEN ROTATOR CUFF REPAIR AND DISTAL CLAVICLE ACROMINECTOMY Right 03/26/2017   Procedure: right shoulder arthroscopy, arthroscopic subacromial decompression, distal clavicle excision, mini open rotator cuff repair;  Surgeon: Thornton Park, MD;  Location: [facility] ORS;  Service: Orthopedics;  Laterality: Right;    Social History   Socioeconomic History  . Marital status: Divorced    Spouse name: Not on file  . Number of children: 3  . Years of education: Not on file  . Highest education level: 11th grade  Occupational History  . Not on file  Social Needs  . Financial resource strain: Somewhat hard  . Food insecurity    Worry: Sometimes true    Inability: Never true  . Transportation needs    Medical: Yes    Non-medical: No  Tobacco Use  . Smoking status: Former Smoker    Packs/day: 1.00    Types: Cigarettes    Quit  date: 2010    Years since quitting: 10.4  . Smokeless tobacco: Never Used  . Tobacco comment: quit over 30 years ago   Substance and Sexual Activity  . Alcohol use: No  . Drug use: Yes    Types: Marijuana  . Sexual activity: Never  Lifestyle  . Physical activity    Days per week: 0 days    Minutes per session: 0 min  . Stress: Not at all  Relationships  . Social Herbalist on phone: Never    Gets together: Never    Attends religious service: More than 4 times per year    Active member of club or organization: No    Attends meetings of clubs or organizations: Never    Relationship status: Divorced  . Intimate partner violence    Fear of current or ex partner: No    Emotionally abused: No    Physically abused: No    Forced sexual activity: No  Other Topics Concern  . Not on file  Social History  Narrative  . Not on file    Family History  Problem Relation Age of Onset  . Hypertension Mother   . Breast cancer Mother 64  . Breast cancer Sister 40     Current Outpatient Medications:  .  albuterol (PROVENTIL HFA;VENTOLIN HFA) 108 (90 Base) MCG/ACT inhaler, Inhale 2 puffs into the lungs every 6 (six) hours as needed for wheezing or shortness of breath., Disp: 1 Inhaler, Rfl: 3 .  amLODipine (NORVASC) 10 MG tablet, Take 1 tablet by mouth 1 day or 1 dose., Disp: , Rfl:  .  ANORO ELLIPTA 62.5-25 MCG/INH AEPB, INHALE 1 PUFF BY MOUTH DAILY (Patient not taking: Reported on 03/17/2019), Disp: 60 each, Rfl: 0 .  BREO ELLIPTA 200-25 MCG/INH AEPB, Inhale 1 puff into the lungs daily. , Disp: , Rfl:  .  clonazePAM (KLONOPIN) 0.5 MG tablet, Take 0.5 mg by mouth 3 (three) times daily. , Disp: , Rfl:  .  cloNIDine (CATAPRES) 0.3 MG tablet, Take 2 tablets (0.6 mg total) by mouth 2 (two) times daily. (Patient taking differently: Take 0.3 mg by mouth 2 (two) times daily. ), Disp: 120 tablet, Rfl: 2 .  hydrALAZINE (APRESOLINE) 100 MG tablet, Take 1 tablet (100 mg total) by mouth 3  (three) times daily. (Patient taking differently: Take 100 mg by mouth daily. ), Disp: 90 tablet, Rfl: 1 .  HYDROcodone-acetaminophen (NORCO/VICODIN) 5-325 MG tablet, Take 1 tablet by mouth every 6 (six) hours as needed for moderate pain. (Patient not taking: Reported on 03/17/2019), Disp: 8 tablet, Rfl: 0 .  hydrOXYzine (ATARAX/VISTARIL) 10 MG tablet, TAKE 1 TABLET BY MOUTH THREE TIMES A DAY AS NEEDED (Patient taking differently: Take 10 mg by mouth 3 (three) times daily as needed for itching. ), Disp: 90 tablet, Rfl: 0 .  lidocaine-prilocaine (EMLA) cream, Apply to affected area once, Disp: 30 g, Rfl: 3 .  loperamide (IMODIUM) 2 MG capsule, Take 2 capsules (4 mg total) by mouth 3 (three) times daily., Disp: 60 capsule, Rfl: 2 .  LORazepam (ATIVAN) 0.5 MG tablet, Take 1 tablet (0.5 mg total) by mouth every 8 (eight) hours as needed for anxiety., Disp: 90 tablet, Rfl: 0 .  lovastatin (MEVACOR) 40 MG tablet, Take 1 tablet (40 mg total) by mouth at bedtime., Disp: 90 tablet, Rfl: 1 .  meclizine (ANTIVERT) 12.5 MG tablet, Take 12.5 mg by mouth 3 (three) times daily as needed for dizziness. , Disp: , Rfl: 0 .  megestrol (MEGACE) 400 MG/10ML suspension, Take 10 mLs (400 mg total) by mouth daily for 30 doses., Disp: 300 mL, Rfl: 2 .  methocarbamol (ROBAXIN) 500 MG tablet, Take 1 tablet (500 mg total) by mouth at bedtime as needed for muscle spasms., Disp: 30 tablet, Rfl: 0 .  metoprolol succinate (TOPROL-XL) 100 MG 24 hr tablet, TAKE 1 TABLET BY MOUTH DAILY, Disp: 30 tablet, Rfl: 0 .  mirtazapine (REMERON) 15 MG tablet, take 1 tablet by mouth at bedtime for APPETITIE (Patient taking differently: Take 15 mg by mouth at bedtime. take 1 tablet by mouth at bedtime for APPETITIE), Disp: 180 tablet, Rfl: 0 .  montelukast (SINGULAIR) 10 MG tablet, Take 1 tablet (10 mg total) by mouth at bedtime as needed., Disp: 90 tablet, Rfl: 1 .  OLANZapine (ZYPREXA) 10 MG tablet, Take 1 tablet (10 mg total) by mouth at bedtime.,  Disp: 30 tablet, Rfl: 1 .  omeprazole (PRILOSEC) 40 MG capsule, Take 1 capsule (40 mg total) by mouth 2 (two) times daily., Disp: 60 capsule,  Rfl: 2 .  ondansetron (ZOFRAN) 8 MG tablet, Take 1 tablet (8 mg total) by mouth 2 (two) times daily as needed for refractory nausea / vomiting. Start on day 3 after carboplatin chemo., Disp: 30 tablet, Rfl: 1 .  oxybutynin (DITROPAN) 5 MG tablet, TAKE 1 TABLET BY MOUTH TWICE DAILY (Patient not taking: No sig reported), Disp: 180 tablet, Rfl: 0 .  oxyCODONE-acetaminophen (PERCOCET/ROXICET) 5-325 MG tablet, Take 1 tablet by mouth every 6 (six) hours as needed for severe pain., Disp: 20 tablet, Rfl: 0 .  Potassium 99 MG TABS, Take 1 tablet by mouth daily as needed (cramps). , Disp: , Rfl:  .  prochlorperazine (COMPAZINE) 10 MG tablet, Take 1 tablet (10 mg total) by mouth every 6 (six) hours as needed (Nausea or vomiting)., Disp: 30 tablet, Rfl: 1 .  sertraline (ZOLOFT) 100 MG tablet, Take 2 tablets (200 mg total) by mouth at bedtime., Disp: 60 tablet, Rfl: 3 .  terazosin (HYTRIN) 5 MG capsule, TAKE 1 CAPSULE BY MOUTH ONCE DAILY AT BEDTIME, Disp: 30 capsule, Rfl: 0 .  traZODone (DESYREL) 50 MG tablet, Take 0.5-1 tablets (25-50 mg total) by mouth at bedtime as needed for sleep., Disp: 30 tablet, Rfl: 3 .  triamcinolone cream (KENALOG) 0.1 %, APPLY TOPICALLY TWICE DAILY AS NEEDED FOR ITCHING (Patient taking differently: Apply 1 application topically 2 (two) times daily as needed (itching). ), Disp: 30 g, Rfl: 0  Physical exam:  Vitals:   03/24/19 1513  BP: (!) 204/80  Pulse: (!) 112  Resp: 20  Temp: 97.6 F (36.4 C)  TempSrc: Tympanic   Physical Exam Constitutional:      General: She is not in acute distress.    Comments: Alert, no acute distress, in wheelchair asking for food.   HENT:     Head: Normocephalic and atraumatic.     Mouth/Throat:     Mouth: Mucous membranes are moist.     Pharynx: Oropharynx is clear.  Eyes:     General: No scleral  icterus. Cardiovascular:     Rate and Rhythm: Regular rhythm. Tachycardia present.  Pulmonary:     Effort: Pulmonary effort is normal. No respiratory distress.  Abdominal:     General: There is no distension.     Palpations: Abdomen is soft.     Tenderness: There is no abdominal tenderness (pt localizes pain to suprapubic). There is no right CVA tenderness or left CVA tenderness.     Comments: Ileostomy in place. Some leakage at site. Liquid stool in bag. Abdomen feels softer than prior exam. Pain not reproducible.   Skin:    General: Skin is warm and dry.  Neurological:     Mental Status: She is alert and oriented to person, place, and time.  Psychiatric:        Mood and Affect: Mood is anxious.      CMP Latest Ref Rng & Units 03/24/2019  Glucose 70 - 99 mg/dL 204(H)  BUN 8 - 23 mg/dL 19  Creatinine 0.44 - 1.00 mg/dL 0.89  Sodium 135 - 145 mmol/L 132(L)  Potassium 3.5 - 5.1 mmol/L 2.7(LL)  Chloride 98 - 111 mmol/L 99  CO2 22 - 32 mmol/L 21(L)  Calcium 8.9 - 10.3 mg/dL 9.0  Total Protein 6.5 - 8.1 g/dL 7.7  Total Bilirubin 0.3 - 1.2 mg/dL 0.6  Alkaline Phos 38 - 126 U/L 126  AST 15 - 41 U/L 29  ALT 0 - 44 U/L 29   CBC Latest Ref Rng &  Units 03/24/2019  WBC 4.0 - 10.5 K/uL 13.7(H)  Hemoglobin 12.0 - 15.0 g/dL 12.6  Hematocrit 36.0 - 46.0 % 38.4  Platelets 150 - 400 K/uL 221    No images are attached to the encounter.  Ct Chest W Contrast  Result Date: 03/24/2019 CLINICAL DATA:  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 Diagnosis May 2020. Currently undergoing chemotherapy and radiation. EXAM: CT CHEST, ABDOMEN, AND PELVIS WITH CONTRAST TECHNIQUE: Multidetector CT imaging of the chest, abdomen and pelvis was performed following the standard protocol during bolus administration of intravenous contrast. CONTRAST:  180m OMNIPAQUE IOHEXOL 300 MG/ML  SOLN COMPARISON:  PET-CT 02/05/2019 FINDINGS: CT CHEST FINDINGS Cardiovascular: The heart is normal in size. No  pericardial effusion. The aorta is normal in caliber. Moderate atherosclerotic calcifications. No dissection. The branch vessels are patent. Stable coronary artery calcifications. Mediastinum/Nodes: No mediastinal or hilar mass or lymphadenopathy. The esophagus is unremarkable. Lungs/Pleura: Stable underlying emphysematous changes. The right upper lobe pulmonary lesion measures 7.5 mm on image number 58 and previously measured 15.5 mm. Semi solid nodular left upper lobe lesion peripherally on image number 85 measures 18 mm and is unchanged. This was not hypermetabolic on the recent PET-CT but recommend continued observation/surveillance. A few tiny scattered subpleural nodules are stable and likely represent lymph nodes. No infiltrates or effusions. Musculoskeletal: No breast masses, supraclavicular or axillary adenopathy. The right-sided Port-A-Cath is stable. The thyroid gland is unremarkable. Stable small right nodule. No significant bony findings. CT ABDOMEN PELVIS FINDINGS Hepatobiliary: Stable low-attenuation lesion in segment 6 most consistent with a benign hepatic cyst. No worrisome hepatic lesions or intrahepatic biliary dilatation. The gallbladder is surgically absent. Mild associated common bile duct dilatation. Pancreas: No mass, inflammation or ductal dilatation. Spleen: Normal size.  No focal lesions. Adrenals/Urinary Tract: The adrenal glands are normal in stable. No renal, ureteral or bladder calculi or mass. Stomach/Bowel: The stomach, duodenum, small bowel and colon are unremarkable. No acute inflammatory changes, mass lesions or obstructive findings. Patient has a loop ileostomy in the right lower quadrant. Prior left hemicolectomy with transverse colon to rectal anastomosis. There is dense contrast in the rectum with moderate artifact but no findings suspicious for residual or recurrent tumor. Vascular/Lymphatic: Stable aortic and iliac artery calcifications but no aneurysm. The major venous  structures are patent. No mesenteric or retroperitoneal mass or adenopathy. No pelvic lymphadenopathy. Reproductive: The uterus is surgically absent. Both ovaries are still present and appear normal. There is a simple appearing cyst associated with the left ovary. Other: No free pelvic fluid collections or pelvic abscess. No inguinal mass or adenopathy. No subcutaneous lesions. Musculoskeletal: No significant bony findings. IMPRESSION: 1. Interval decrease in size of the right upper lobe pulmonary nodule now measuring 7.5 mm and previously measuring 15.5 mm. 2. Stable 18 mm semi-solid airspace nodule in the left upper lobe. Recommend continued surveillance. 3. No mediastinal or hilar mass or adenopathy. 4. Stable surgical changes involving the colon. No findings suspicious for recurrent tumor, locoregional adenopathy or distant metastatic disease. Electronically Signed   By: PMarijo SanesM.D.   On: 03/24/2019 15:26   Mr BJeri CosWAXContrast  Result Date: 02/26/2019 CLINICAL DATA:  History of lung and colon cancer.  Dizziness. EXAM: MRI HEAD WITHOUT AND WITH CONTRAST TECHNIQUE: Multiplanar, multiecho pulse sequences of the brain and surrounding structures were obtained without and with intravenous contrast. CONTRAST:  6 cc Gadavist COMPARISON:  01/31/2017 FINDINGS: Brain: Numerous old small infarctions in the inferior cerebellum on the  left. No brainstem abnormality is seen. Cerebral hemispheres elsewhere show moderate chronic small-vessel change of the deep and subcortical white matter. No cortical or large vessel territory infarction. No mass lesion, hemorrhage, hydrocephalus or extra-axial collection. No abnormal contrast enhancement occurs. Vascular: Major vessels at the base of the brain show flow. Skull and upper cervical spine: Negative Sinuses/Orbits: Clear/normal Other: None IMPRESSION: No acute or reversible finding. Chronic small-vessel ischemic changes of the cerebral hemispheric white matter.  Numerous old small vessel infarctions at the inferior cerebellum on the left. No evidence of metastatic disease. Electronically Signed   By: Nelson Chimes M.D.   On: 02/26/2019 15:15   Ct Abdomen Pelvis W Contrast  Result Date: 03/24/2019 CLINICAL DATA:  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 Diagnosis May 2020. Currently undergoing chemotherapy and radiation. EXAM: CT CHEST, ABDOMEN, AND PELVIS WITH CONTRAST TECHNIQUE: Multidetector CT imaging of the chest, abdomen and pelvis was performed following the standard protocol during bolus administration of intravenous contrast. CONTRAST:  14m OMNIPAQUE IOHEXOL 300 MG/ML  SOLN COMPARISON:  PET-CT 02/05/2019 FINDINGS: CT CHEST FINDINGS Cardiovascular: The heart is normal in size. No pericardial effusion. The aorta is normal in caliber. Moderate atherosclerotic calcifications. No dissection. The branch vessels are patent. Stable coronary artery calcifications. Mediastinum/Nodes: No mediastinal or hilar mass or lymphadenopathy. The esophagus is unremarkable. Lungs/Pleura: Stable underlying emphysematous changes. The right upper lobe pulmonary lesion measures 7.5 mm on image number 58 and previously measured 15.5 mm. Semi solid nodular left upper lobe lesion peripherally on image number 85 measures 18 mm and is unchanged. This was not hypermetabolic on the recent PET-CT but recommend continued observation/surveillance. A few tiny scattered subpleural nodules are stable and likely represent lymph nodes. No infiltrates or effusions. Musculoskeletal: No breast masses, supraclavicular or axillary adenopathy. The right-sided Port-A-Cath is stable. The thyroid gland is unremarkable. Stable small right nodule. No significant bony findings. CT ABDOMEN PELVIS FINDINGS Hepatobiliary: Stable low-attenuation lesion in segment 6 most consistent with a benign hepatic cyst. No worrisome hepatic lesions or intrahepatic biliary dilatation. The gallbladder is  surgically absent. Mild associated common bile duct dilatation. Pancreas: No mass, inflammation or ductal dilatation. Spleen: Normal size.  No focal lesions. Adrenals/Urinary Tract: The adrenal glands are normal in stable. No renal, ureteral or bladder calculi or mass. Stomach/Bowel: The stomach, duodenum, small bowel and colon are unremarkable. No acute inflammatory changes, mass lesions or obstructive findings. Patient has a loop ileostomy in the right lower quadrant. Prior left hemicolectomy with transverse colon to rectal anastomosis. There is dense contrast in the rectum with moderate artifact but no findings suspicious for residual or recurrent tumor. Vascular/Lymphatic: Stable aortic and iliac artery calcifications but no aneurysm. The major venous structures are patent. No mesenteric or retroperitoneal mass or adenopathy. No pelvic lymphadenopathy. Reproductive: The uterus is surgically absent. Both ovaries are still present and appear normal. There is a simple appearing cyst associated with the left ovary. Other: No free pelvic fluid collections or pelvic abscess. No inguinal mass or adenopathy. No subcutaneous lesions. Musculoskeletal: No significant bony findings. IMPRESSION: 1. Interval decrease in size of the right upper lobe pulmonary nodule now measuring 7.5 mm and previously measuring 15.5 mm. 2. Stable 18 mm semi-solid airspace nodule in the left upper lobe. Recommend continued surveillance. 3. No mediastinal or hilar mass or adenopathy. 4. Stable surgical changes involving the colon. No findings suspicious for recurrent tumor, locoregional adenopathy or distant metastatic disease. Electronically Signed   By: PRicky StabsD.  On: 03/24/2019 15:26   Dg Chest Port 1 View  Result Date: 03/12/2019 CLINICAL DATA:  Port-A-Cath placement. EXAM: PORTABLE CHEST 1 VIEW COMPARISON:  Chest x-ray 02/12/2019 FINDINGS: Right IJ power port tip is at the level of the carina in the mid SVC. No complicating  features. The cardiac silhouette, mediastinal and hilar contours are stable. The lungs are clear. No pleural effusion. IMPRESSION: Right IJ power port in good position with the tip in the mid SVC. No complicating features. Electronically Signed   By: Marijo Sanes M.D.   On: 03/12/2019 14:26   Dg Be (colon)w Single Cm (sol Or Thin Ba)  Result Date: 02/25/2019 CLINICAL DATA:  Rectal pain, s/p sigmoid resection with reanastomosis and diverting ileostomy EXAM: BE LIMITED WITH CONTRAST CONTRAST:  Single-contrast barium FLUOROSCOPY TIME:  Fluoroscopy Time:  1:00 Number of Acquired Spot Images: 8 COMPARISON:  PET-CT, 02/05/2019 FINDINGS: Following rectal cannulation and precontrast spot images, barium contrast was administered through the rectal tube under gentle gravitational pressure to the limit of patient tolerance and catheter leakage. The rectum is contrast opacified to the sigmorectal anastomosis and just proximal to the visualized sutures, but does not pass further. No evidence of leak or fistula. IMPRESSION: The rectum is contrast opacified to the sigmorectal anastomosis and just proximal to the visualized sutures, but does not pass further. There may be stenosis or adhesion in the vicinity of the anastomosis. No evidence of leak or fistula. Electronically Signed   By: Eddie Candle M.D.   On: 02/25/2019 11:01   Dg C-arm 1-60 Min-no Report  Result Date: 03/12/2019 Fluoroscopy was utilized by the requesting physician.  No radiographic interpretation.    Assessment and plan- Patient is a 73 y.o. female diagnosed with small cell lung cancer and colon cancer, who presents to symptom management clinic for abdominal pain and vomiting.   1. Abdominal Pain- unclear etiology- CT unremarkable for acute cause of abdominal pain. In setting of urinary frequency, UA pending. WBC elevated, ANC 11.7. Afebrile. Received Neulasta OnPro with chemo but question infection given report of dohle bodies and acute symptoms.  Patient became increasingly painful in clinic and vomited after eating & drinking. Discussed with Dr. Janese Banks who recommends transfer to ER for IV fluids, potassium replacement, and further evaluation & management. ER notified.   2. Hypokalemia- K 2.7; likely associated w/ poor intake and GI loss. Recommend IV replacement.   3. Hypertension- bp elevated persistently in clinic. Improved to 186/85 on recheck. Not currently on anti-hypertensives. I've reached out to PCP multiple times without success and per her request I've reached out to [name] as well. Awaiting call back from [facility] to schedule follow-up appt but would consider initiating antihypertensives in interim.   Patient transferred to ER. Dr. Janese Banks notified & updated and agrees with plan of care.    Visit Diagnosis 1. Abdominal pain, unspecified abdominal location   2. Urinary frequency   3. Hypokalemia due to excessive gastrointestinal loss of potassium   4. Hypertension, unspecified type     Patient expressed understanding and was in agreement with this plan. She also understands that She can call clinic at any time with any questions, concerns, or complaints.   Thank you for allowing me to participate in the care of this patient.   Beckey Rutter, DNP, AGNP-C [facility] [phone] (work cell) [phone] (office)  CC: Dr. Janese Banks

## 2019-03-24 NOTE — Progress Notes (Signed)
CT scan abdomen/pelvis STAT ordered.   Labs ordered.   Will get scheduled this AM.   Faythe Casa, NP 03/24/2019 10:28 AM

## 2019-03-24 NOTE — Telephone Encounter (Signed)
I called her and we are trying to get a CT scan scheduled ASAP.

## 2019-03-24 NOTE — ED Notes (Signed)
Blood work done at [facility] this morning. No new labs ordered

## 2019-03-24 NOTE — ED Provider Notes (Signed)
[facility] Emergency Department Provider Note  ____________________________________________  Time seen: Approximately 7:39 PM  I have reviewed the triage vital signs and the nursing notes.   HISTORY  Chief Complaint Abdominal Pain and Nausea    HPI Gloria Allen is a 73 y.o. female who presents the emergency department complaining of suprapubic pain, urinary frequency, subjective fevers at home.  Patient is a cancer patient receiving chemo and radiation for both colon cancer as well as small cell lung cancer.  Given patient's complaints, patient had labs and imaging performed at the cancer center.  Patient had an elevated white blood cell count and a reassuring CT scan abdomen and pelvis.  Given patient's symptoms they were concerned for urinary tract infection as well as the fact that the patient is hypokalemic.  They referred to the emergency department for further management.  Patient endorses ongoing suprapubic pain x2 weeks with significant urinary frequency.  She denies any gross dysuria.  No hematuria.  Patient denies any nausea vomiting, diarrhea or constipation.         Past Medical History:  Diagnosis Date  . Anxiety   . Arthritis   . Asthma   . Cancer ([place])   . Cervical central spinal stenosis (C3-C7) (worse at C4-5) 07/30/2017  . Cervical foraminal stenosis (C4-5 and C5-6) (Bilateral) (L>R) 07/30/2017  . Chronic lower extremity pain (Fourth Area of Pain) (Bilateral) (R>L) 06/11/2017  . Chronic neck pain (Primary Area of Pain) (Bilateral) (R>L) 06/11/2017  . Chronic sacroiliac joint pain (Bilateral) (R>L) 06/11/2017  . Chronic shoulder pain [facility] Area of Pain) (Right) 06/11/2017  . Chronic upper extremity pain (Fifth Area of Pain) (Bilateral) (R>L) 06/11/2017  . Colon cancer ([place])   . DDD (degenerative disc disease), cervical 07/30/2017  . DDD (degenerative disc disease), lumbar 07/30/2017  . Depression   . DISH (diffuse idiopathic skeletal  hyperostosis) 07/30/2017  . Dyspnea    with exertion  . Entrapment syndrome 06/20/2017   2002 on R and 2010 on L  . Full thickness rotator cuff tear 06/12/2017  . GERD (gastroesophageal reflux disease)   . Grade 1 Anterolisthesis of L4 over L5 07/30/2017  . Headache   . Hx: UTI (urinary tract infection)   . Hypertension   . Inflammation of joint of shoulder region 01/15/2017  . Lumbar central spinal stenosis (L4-5) 07/30/2017  . Lumbar disc protrusion (Left: L5-S1) (Right: L1-2) 07/30/2017   L5-S1 left foraminal protrusion with L5 impingement. L1-2 right paracentral protrusion without impingement.  . Lumbar facet arthropathy (Bilateral) 07/30/2017  . Lumbar facet syndrome (Bilateral) (R>L) 07/30/2017  . Lung cancer ([place])   . Osteoarthritis of shoulder (Right) 01/15/2017    Patient Active Problem List   Diagnosis Date Noted  . Essential hypertension 03/06/2019  . Hyperlipidemia 03/06/2019  . Small cell lung cancer ([place]) 02/24/2019  . Goals of care, counseling/discussion 12/19/2018  . Malignant neoplasm of colon ([place]) 12/19/2018  . S/P laparoscopic colectomy 11/27/2018  . Closed bimalleolar fracture of right ankle 11/24/2018  . Ankylosing spondylitis of multiple sites in spine ([place]) 11/21/2018  . Severe episode of recurrent major depressive disorder, without psychotic features ([place]) 11/21/2018  . Ankle fracture, right 11/21/2018  . Benign neoplasm of ascending colon   . Malignant neoplasm of sigmoid colon ([place])   . Melanosis, colon   . Special screening for malignant neoplasms, colon   . Maculopapular rash   . Colon neoplasm   . Melena   . Columnar epithelial-lined lower esophagus   .  Stomach irritation   . IFG (impaired fasting glucose) 04/22/2018  . Ataxia 03/04/2018  . Atypical chest pain 03/04/2018  . SOB (shortness of breath) 01/26/2018  . Laryngopharyngeal reflux (LPR) 08/16/2017  . Suspected exposure to mold 08/05/2017  . Poor historian 08/05/2017  . Lumbar foraminal  stenosis (Bilateral: L4-5) (Left: L5-S1) 07/30/2017  . Osteopenia of spine 07/30/2017  . PTSD (post-traumatic stress disorder) 06/20/2017  . Cervical arthritis 06/20/2017  . Bronchial asthma 06/20/2017  . Long term current use of opiate analgesic 06/11/2017  . Chronic pain syndrome 06/11/2017  . Anxiety   . Depression   . GERD (gastroesophageal reflux disease)   . Benign hypertensive renal disease     Past Surgical History:  Procedure Laterality Date  . ABDOMINAL HYSTERECTOMY    . APPENDECTOMY    . BREAST REDUCTION SURGERY Bilateral   . COLONOSCOPY WITH PROPOFOL N/A 11/05/2018   Procedure: COLONOSCOPY WITH PROPOFOL;  Surgeon: Virgel Manifold, MD;  Location: [facility] ENDOSCOPY;  Service: Endoscopy;  Laterality: N/A;  . COLONOSCOPY WITH PROPOFOL N/A 11/06/2018   Procedure: COLONOSCOPY WITH PROPOFOL;  Surgeon: Virgel Manifold, MD;  Location: [facility] ENDOSCOPY;  Service: Endoscopy;  Laterality: N/A;  . COLOSTOMY    . DILATION AND CURETTAGE OF UTERUS    . ESOPHAGOGASTRODUODENOSCOPY (EGD) WITH PROPOFOL N/A 11/05/2018   Procedure: ESOPHAGOGASTRODUODENOSCOPY (EGD) WITH PROPOFOL;  Surgeon: Virgel Manifold, MD;  Location: [facility] ENDOSCOPY;  Service: Endoscopy;  Laterality: N/A;  . ILEOSTOMY Right 11/27/2018   Procedure: ILEOSTOMY;  Surgeon: Jules Husbands, MD;  Location: [facility] ORS;  Service: General;  Laterality: Right;  . LAPAROSCOPIC SIGMOID COLECTOMY N/A 11/27/2018   Procedure: LAPAROSCOPIC SIGMOID COLECTOMY;  Surgeon: Jules Husbands, MD;  Location: [facility] ORS;  Service: General;  Laterality: N/A;  . ORIF ANKLE FRACTURE Right 11/21/2018   Procedure: OPEN REDUCTION INTERNAL FIXATION (ORIF) ANKLE FRACTURE;  Surgeon: Corky Mull, MD;  Location: [facility] ORS;  Service: Orthopedics;  Laterality: Right;  . PORTACATH PLACEMENT N/A 03/12/2019   Procedure: INSERTION PORT-A-CATH;  Surgeon: Jules Husbands, MD;  Location: [facility] ORS;  Service: General;  Laterality: N/A;  . REDUCTION MAMMAPLASTY    . SHOULDER  ARTHROSCOPY WITH OPEN ROTATOR CUFF REPAIR AND DISTAL CLAVICLE ACROMINECTOMY Right 03/26/2017   Procedure: right shoulder arthroscopy, arthroscopic subacromial decompression, distal clavicle excision, mini open rotator cuff repair;  Surgeon: Thornton Park, MD;  Location: [facility] ORS;  Service: Orthopedics;  Laterality: Right;    Prior to Admission medications   Medication Sig Start Date End Date Taking? Authorizing Provider  albuterol (PROVENTIL HFA;VENTOLIN HFA) 108 (90 Base) MCG/ACT inhaler Inhale 2 puffs into the lungs every 6 (six) hours as needed for wheezing or shortness of breath. 04/02/18   Kathrine Haddock, NP  amLODipine (NORVASC) 10 MG tablet Take 1 tablet by mouth 1 day or 1 dose. 01/19/19   [provider]  Celedonio Miyamoto 62.5-25 MCG/INH AEPB INHALE 1 PUFF BY MOUTH DAILY Patient not taking: Reported on 03/17/2019 08/25/18   Park Liter P, DO  BREO ELLIPTA 200-25 MCG/INH AEPB Inhale 1 puff into the lungs daily.  08/11/18   [provider]  clonazePAM (KLONOPIN) 0.5 MG tablet Take 0.5 mg by mouth 3 (three) times daily.     [provider]  cloNIDine (CATAPRES) 0.3 MG tablet Take 2 tablets (0.6 mg total) by mouth 2 (two) times daily. Patient taking differently: Take 0.3 mg by mouth 2 (two) times daily.  05/19/18   Volney American, PA-C  hydrALAZINE (APRESOLINE) 100 MG  tablet Take 1 tablet (100 mg total) by mouth 3 (three) times daily. Patient taking differently: Take 100 mg by mouth daily.  04/22/18   Johnson, Megan P, DO  HYDROcodone-acetaminophen (NORCO/VICODIN) 5-325 MG tablet Take 1 tablet by mouth every 6 (six) hours as needed for moderate pain. Patient not taking: Reported on 03/17/2019 03/12/19   Caroleen Hamman F, MD  hydrOXYzine (ATARAX/VISTARIL) 10 MG tablet TAKE 1 TABLET BY MOUTH THREE TIMES A DAY AS NEEDED Patient taking differently: Take 10 mg by mouth 3 (three) times daily as needed for itching.  06/22/18   Park Liter P, DO  lidocaine-prilocaine  (EMLA) cream Apply to affected area once 02/27/19   Sindy Guadeloupe, MD  loperamide (IMODIUM) 2 MG capsule Take 2 capsules (4 mg total) by mouth 3 (three) times daily. 03/12/19   Pabon, Diego F, MD  LORazepam (ATIVAN) 0.5 MG tablet Take 1 tablet (0.5 mg total) by mouth every 8 (eight) hours as needed for anxiety. 02/24/19   Sindy Guadeloupe, MD  lovastatin (MEVACOR) 40 MG tablet Take 1 tablet (40 mg total) by mouth at bedtime. 04/24/18   Johnson, Megan P, DO  meclizine (ANTIVERT) 12.5 MG tablet Take 12.5 mg by mouth 3 (three) times daily as needed for dizziness.  08/20/18   [provider]  megestrol (MEGACE) 400 MG/10ML suspension Take 10 mLs (400 mg total) by mouth daily for 30 doses. 03/12/19 04/11/19  Pabon, Marjory Lies, MD  methocarbamol (ROBAXIN) 500 MG tablet Take 1 tablet (500 mg total) by mouth at bedtime as needed for muscle spasms. 05/19/18   Volney American, PA-C  metoprolol succinate (TOPROL-XL) 100 MG 24 hr tablet TAKE 1 TABLET BY MOUTH DAILY 06/22/18   Johnson, Megan P, DO  mirtazapine (REMERON) 15 MG tablet take 1 tablet by mouth at bedtime for APPETITIE Patient taking differently: Take 15 mg by mouth at bedtime. take 1 tablet by mouth at bedtime for APPETITIE 04/24/18   Johnson, Megan P, DO  montelukast (SINGULAIR) 10 MG tablet Take 1 tablet (10 mg total) by mouth at bedtime as needed. 04/02/18   Kathrine Haddock, NP  OLANZapine (ZYPREXA) 10 MG tablet Take 1 tablet (10 mg total) by mouth at bedtime. 03/17/19   Sindy Guadeloupe, MD  omeprazole (PRILOSEC) 40 MG capsule Take 1 capsule (40 mg total) by mouth 2 (two) times daily. 04/24/18   Johnson, Megan P, DO  ondansetron (ZOFRAN) 8 MG tablet Take 1 tablet (8 mg total) by mouth 2 (two) times daily as needed for refractory nausea / vomiting. Start on day 3 after carboplatin chemo. 02/27/19   Sindy Guadeloupe, MD  oxybutynin (DITROPAN) 5 MG tablet TAKE 1 TABLET BY MOUTH TWICE DAILY Patient not taking: No sig reported 08/25/18   Park Liter P, DO   oxyCODONE-acetaminophen (PERCOCET/ROXICET) 5-325 MG tablet Take 1 tablet by mouth every 6 (six) hours as needed for severe pain. 02/16/19   Pabon, [place], MD  Potassium 99 MG TABS Take 1 tablet by mouth daily as needed (cramps).     [provider]  prochlorperazine (COMPAZINE) 10 MG tablet Take 1 tablet (10 mg total) by mouth every 6 (six) hours as needed (Nausea or vomiting). 02/27/19   Sindy Guadeloupe, MD  sertraline (ZOLOFT) 100 MG tablet Take 2 tablets (200 mg total) by mouth at bedtime. 04/24/18   Johnson, Megan P, DO  terazosin (HYTRIN) 5 MG capsule TAKE 1 CAPSULE BY MOUTH ONCE DAILY AT BEDTIME 06/22/18   Johnson, [employer],  DO  traZODone (DESYREL) 50 MG tablet Take 0.5-1 tablets (25-50 mg total) by mouth at bedtime as needed for sleep. 04/24/18   Johnson, Megan P, DO  triamcinolone cream (KENALOG) 0.1 % APPLY TOPICALLY TWICE DAILY AS NEEDED FOR ITCHING Patient taking differently: Apply 1 application topically 2 (two) times daily as needed (itching).  06/22/18   Park Liter P, DO    Allergies Ace inhibitors, Angiotensin receptor blockers, Sulfa antibiotics, Chlorthalidone, and Flexeril [cyclobenzaprine]  Family History  Problem Relation Age of Onset  . Hypertension Mother   . Breast cancer Mother 1  . Breast cancer Sister 22    Social History Social History   Tobacco Use  . Smoking status: Former Smoker    Packs/day: 1.00    Types: Cigarettes    Quit date: 2010    Years since quitting: 10.4  . Smokeless tobacco: Never Used  . Tobacco comment: quit over 30 years ago   Substance Use Topics  . Alcohol use: No  . Drug use: Yes    Types: Marijuana     Review of Systems  Constitutional: No fever/chills Eyes: No visual changes. No discharge ENT: No upper respiratory complaints. Cardiovascular: no chest pain. Respiratory: no cough. No SOB. Gastrointestinal: Positive for suprapubic abdominal pain.  No other abdominal pain.  No nausea, no vomiting.  No diarrhea.  No  constipation. Genitourinary: Negative for dysuria. No hematuria.  Positive for polyuria. Musculoskeletal: Negative for musculoskeletal pain. Skin: Negative for rash, abrasions, lacerations, ecchymosis. Neurological: Negative for headaches, focal weakness or numbness. 10-point ROS otherwise negative.  ____________________________________________   PHYSICAL EXAM:  VITAL SIGNS: ED Triage Vitals  Enc Vitals Group     BP 03/24/19 1618 (!) 163/104     Pulse Rate 03/24/19 1618 (!) 107     Resp 03/24/19 1618 16     Temp 03/24/19 1618 99.5 F (37.5 C)     Temp Source 03/24/19 1618 Oral     SpO2 03/24/19 1618 99 %     Weight 03/24/19 1619 147 lb (66.7 kg)     Height 03/24/19 1619 5\' 5"  (1.651 m)     Head Circumference --      Peak Flow --      Pain Score 03/24/19 1619 10     Pain Loc --      Pain Edu? --      Excl. in [place]? --      Constitutional: Alert and oriented. Well appearing and in no acute distress. Eyes: Conjunctivae are normal. PERRL. EOMI. Head: Atraumatic.  Neck: No stridor.   Hematological/Lymphatic/Immunilogical: No cervical lymphadenopathy. Cardiovascular: Normal rate, regular rhythm. Normal S1 and S2.  Good peripheral circulation. Respiratory: Normal respiratory effort without tachypnea or retractions. Lungs CTAB. Good air entry to the bases with no decreased or absent breath sounds. Gastrointestinal: Bowel sounds 4 quadrants.  Soft to palpation all quadrants.  Tenderness to palpation over the suprapubic region.  No palpable abnormality.  No guarding or rigidity. No palpable masses. No distention. No CVA tenderness. Musculoskeletal: Full range of motion to all extremities. No gross deformities appreciated. Neurologic:  Normal speech and language. No gross focal neurologic deficits are appreciated.  Skin:  Skin is warm, dry and intact. No rash noted. Psychiatric: Mood and affect are normal. Speech and behavior are normal. Patient exhibits appropriate insight and  judgement.   ____________________________________________   LABS (all labs ordered are listed, but only abnormal results are displayed)  Labs Reviewed  LACTIC ACID, PLASMA - Abnormal; Notable for  the following components:      Result Value   Lactic Acid, Venous 3.9 (*)    All other components within normal limits  COMPREHENSIVE METABOLIC PANEL - Abnormal; Notable for the following components:   Sodium 132 (*)    Potassium 2.9 (*)    Chloride 95 (*)    Glucose, Bld 168 (*)    Total Protein 8.3 (*)    Alkaline Phosphatase 142 (*)    All other components within normal limits  CBC WITH DIFFERENTIAL/PLATELET - Abnormal; Notable for the following components:   WBC 11.8 (*)    All other components within normal limits  NOVEL CORONAVIRUS, NAA (HOSPITAL ORDER, SEND-OUT TO REF LAB)  LIPASE, BLOOD  LACTIC ACID, PLASMA   ____________________________________________  EKG   ____________________________________________  RADIOLOGY I personally viewed and evaluated these images as part of my medical decision making, as well as reviewing the written report by the radiologist.  Ct Chest W Contrast  Result Date: 03/24/2019 CLINICAL DATA:  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 Diagnosis May 2020. Currently undergoing chemotherapy and radiation. EXAM: CT CHEST, ABDOMEN, AND PELVIS WITH CONTRAST TECHNIQUE: Multidetector CT imaging of the chest, abdomen and pelvis was performed following the standard protocol during bolus administration of intravenous contrast. CONTRAST:  168mL OMNIPAQUE IOHEXOL 300 MG/ML  SOLN COMPARISON:  PET-CT 02/05/2019 FINDINGS: CT CHEST FINDINGS Cardiovascular: The heart is normal in size. No pericardial effusion. The aorta is normal in caliber. Moderate atherosclerotic calcifications. No dissection. The branch vessels are patent. Stable coronary artery calcifications. Mediastinum/Nodes: No mediastinal or hilar mass or lymphadenopathy. The esophagus is  unremarkable. Lungs/Pleura: Stable underlying emphysematous changes. The right upper lobe pulmonary lesion measures 7.5 mm on image number 58 and previously measured 15.5 mm. Semi solid nodular left upper lobe lesion peripherally on image number 85 measures 18 mm and is unchanged. This was not hypermetabolic on the recent PET-CT but recommend continued observation/surveillance. A few tiny scattered subpleural nodules are stable and likely represent lymph nodes. No infiltrates or effusions. Musculoskeletal: No breast masses, supraclavicular or axillary adenopathy. The right-sided Port-A-Cath is stable. The thyroid gland is unremarkable. Stable small right nodule. No significant bony findings. CT ABDOMEN PELVIS FINDINGS Hepatobiliary: Stable low-attenuation lesion in segment 6 most consistent with a benign hepatic cyst. No worrisome hepatic lesions or intrahepatic biliary dilatation. The gallbladder is surgically absent. Mild associated common bile duct dilatation. Pancreas: No mass, inflammation or ductal dilatation. Spleen: Normal size.  No focal lesions. Adrenals/Urinary Tract: The adrenal glands are normal in stable. No renal, ureteral or bladder calculi or mass. Stomach/Bowel: The stomach, duodenum, small bowel and colon are unremarkable. No acute inflammatory changes, mass lesions or obstructive findings. Patient has a loop ileostomy in the right lower quadrant. Prior left hemicolectomy with transverse colon to rectal anastomosis. There is dense contrast in the rectum with moderate artifact but no findings suspicious for residual or recurrent tumor. Vascular/Lymphatic: Stable aortic and iliac artery calcifications but no aneurysm. The major venous structures are patent. No mesenteric or retroperitoneal mass or adenopathy. No pelvic lymphadenopathy. Reproductive: The uterus is surgically absent. Both ovaries are still present and appear normal. There is a simple appearing cyst associated with the left ovary.  Other: No free pelvic fluid collections or pelvic abscess. No inguinal mass or adenopathy. No subcutaneous lesions. Musculoskeletal: No significant bony findings. IMPRESSION: 1. Interval decrease in size of the right upper lobe pulmonary nodule now measuring 7.5 mm and previously measuring 15.5 mm. 2. Stable 18  mm semi-solid airspace nodule in the left upper lobe. Recommend continued surveillance. 3. No mediastinal or hilar mass or adenopathy. 4. Stable surgical changes involving the colon. No findings suspicious for recurrent tumor, locoregional adenopathy or distant metastatic disease. Electronically Signed   By: Marijo Sanes M.D.   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 Diagnosis May 2020. Currently undergoing chemotherapy and radiation. EXAM: CT CHEST, ABDOMEN, AND PELVIS WITH CONTRAST TECHNIQUE: Multidetector CT imaging of the chest, abdomen and pelvis was performed following the standard protocol during bolus administration of intravenous contrast. CONTRAST:  191mL OMNIPAQUE IOHEXOL 300 MG/ML  SOLN COMPARISON:  PET-CT 02/05/2019 FINDINGS: CT CHEST FINDINGS Cardiovascular: The heart is normal in size. No pericardial effusion. The aorta is normal in caliber. Moderate atherosclerotic calcifications. No dissection. The branch vessels are patent. Stable coronary artery calcifications. Mediastinum/Nodes: No mediastinal or hilar mass or lymphadenopathy. The esophagus is unremarkable. Lungs/Pleura: Stable underlying emphysematous changes. The right upper lobe pulmonary lesion measures 7.5 mm on image number 58 and previously measured 15.5 mm. Semi solid nodular left upper lobe lesion peripherally on image number 85 measures 18 mm and is unchanged. This was not hypermetabolic on the recent PET-CT but recommend continued observation/surveillance. A few tiny scattered subpleural nodules are stable and  likely represent lymph nodes. No infiltrates or effusions. Musculoskeletal: No breast masses, supraclavicular or axillary adenopathy. The right-sided Port-A-Cath is stable. The thyroid gland is unremarkable. Stable small right nodule. No significant bony findings. CT ABDOMEN PELVIS FINDINGS Hepatobiliary: Stable low-attenuation lesion in segment 6 most consistent with a benign hepatic cyst. No worrisome hepatic lesions or intrahepatic biliary dilatation. The gallbladder is surgically absent. Mild associated common bile duct dilatation. Pancreas: No mass, inflammation or ductal dilatation. Spleen: Normal size.  No focal lesions. Adrenals/Urinary Tract: The adrenal glands are normal in stable. No renal, ureteral or bladder calculi or mass. Stomach/Bowel: The stomach, duodenum, small bowel and colon are unremarkable. No acute inflammatory changes, mass lesions or obstructive findings. Patient has a loop ileostomy in the right lower quadrant. Prior left hemicolectomy with transverse colon to rectal anastomosis. There is dense contrast in the rectum with moderate artifact but no findings suspicious for residual or recurrent tumor. Vascular/Lymphatic: Stable aortic and iliac artery calcifications but no aneurysm. The major venous structures are patent. No mesenteric or retroperitoneal mass or adenopathy. No pelvic lymphadenopathy. Reproductive: The uterus is surgically absent. Both ovaries are still present and appear normal. There is a simple appearing cyst associated with the left ovary. Other: No free pelvic fluid collections or pelvic abscess. No inguinal mass or adenopathy. No subcutaneous lesions. Musculoskeletal: No significant bony findings. IMPRESSION: 1. Interval decrease in size of the right upper lobe pulmonary nodule now measuring 7.5 mm and previously measuring 15.5 mm. 2. Stable 18 mm semi-solid airspace nodule in the left upper lobe. Recommend continued surveillance. 3. No mediastinal or hilar mass or  adenopathy. 4. Stable surgical changes involving the colon. No findings suspicious for recurrent tumor, locoregional adenopathy or distant metastatic disease. Electronically Signed   By: Marijo Sanes M.D.   On: 03/24/2019 15:26    ____________________________________________    PROCEDURES  Procedure(s) performed:    Procedures    Medications  potassium chloride 10 mEq in 100 mL IVPB (10 mEq Intravenous New Bag/Given 03/24/19 2040)  sodium chloride 0.9 % bolus 1,000 mL (1,000 mLs Intravenous New Bag/Given 03/24/19 2011)  cefTRIAXone (  ROCEPHIN) 1 g in sodium chloride 0.9 % 100 mL IVPB (0 g Intravenous Stopped 03/24/19 2040)  morphine 4 MG/ML injection 4 mg (4 mg Intravenous Given 03/24/19 2040)  ondansetron (ZOFRAN) injection 4 mg (4 mg Intravenous Given 03/24/19 2040)     ____________________________________________   INITIAL IMPRESSION / ASSESSMENT AND PLAN / ED COURSE  Pertinent labs & imaging results that were available during my care of the patient were reviewed by me and considered in my medical decision making (see chart for details).  Review of the [place] CSRS was performed in accordance of the [place] prior to dispensing any controlled drugs.  Clinical Course as of Mar 23 2104  Tue Mar 24, 2019  1940 Patient presented to the emergency department from the cancer center for further evaluation of elevated white blood cell count, hypokalemia, suprapubic pain with possible urinary tract infection.  Urinalysis had not returned at the time that patient was sent to the emergency department.  Given patient's reassuring exam, and her symptoms at this time I do suspect that she does have a urinary tract infection.  Patient did have imaging and I will not repeat same.  As I will have to start an IV I will draw basic labs to ensure white blood cell count is not trending up.  Patient will also have a lactic acid performed.  Patient will have IV potassium, IV antibiotics here in the emergency  department.  If labs remain stable with no evidence of sepsis, patient will be discharged home with oral antibiotics and follow-up with cancer center.  If labs are worsening and patient's lactic is elevated will consider admission at that time.   [JC]    Clinical Course User Index [JC] Skyla Champagne, Charline Bills, PA-C          Patient's diagnosis is consistent with UTI.  Patient presented to the emergency department with increasing suprapubic pain and urinary frequency.  Patient is a cancer patient undergoing treatment for both colon cancer as well as small cell lung cancer.  Patient was evaluated at the cancer center earlier today, patient's labs revealed an elevated white blood cell count, hypokalemia.  Negative CT scan performed at cancer center.  Patient was sent here for further evaluation.  Urinalysis returns with results concerning for UTI.  Patient was given IV Rocephin, IV potassium.  Patient's lactate did return at 3.9.  Given this finding, with patient's suppressed immune system from chemo, patient is a good candidate for admission.  I discussed with the hospitalist service for admission.  They agreed to admit the patient.  Patient care will be transferred to the hospital service at this time..      ____________________________________________  FINAL CLINICAL IMPRESSION(S) / ED DIAGNOSES  Final diagnoses:  Lower urinary tract infectious disease      NEW MEDICATIONS STARTED DURING THIS VISIT:  ED Discharge Orders    None          This chart was dictated using voice recognition software/Dragon. Despite best efforts to proofread, errors can occur which can change the meaning. Any change was purely unintentional.    Darletta Moll, PA-C 03/24/19 2105    Carrie Mew, MD 03/25/19 [phone]

## 2019-03-24 NOTE — ED Notes (Signed)
Pt given meal tray.

## 2019-03-24 NOTE — Telephone Encounter (Signed)
Gloria Allen will call her now

## 2019-03-24 NOTE — ED Notes (Signed)
Ostomy bag changed and new one applied at this time.

## 2019-03-25 ENCOUNTER — Ambulatory Visit: Payer: Medicare Other

## 2019-03-25 DIAGNOSIS — B370 Candidal stomatitis: Secondary | ICD-10-CM | POA: Diagnosis present

## 2019-03-25 DIAGNOSIS — K219 Gastro-esophageal reflux disease without esophagitis: Secondary | ICD-10-CM | POA: Diagnosis present

## 2019-03-25 DIAGNOSIS — E876 Hypokalemia: Secondary | ICD-10-CM | POA: Diagnosis present

## 2019-03-25 DIAGNOSIS — E872 Acidosis: Secondary | ICD-10-CM | POA: Diagnosis present

## 2019-03-25 DIAGNOSIS — A419 Sepsis, unspecified organism: Secondary | ICD-10-CM | POA: Diagnosis present

## 2019-03-25 DIAGNOSIS — G8929 Other chronic pain: Secondary | ICD-10-CM | POA: Diagnosis present

## 2019-03-25 DIAGNOSIS — Z933 Colostomy status: Secondary | ICD-10-CM | POA: Diagnosis not present

## 2019-03-25 DIAGNOSIS — Z7951 Long term (current) use of inhaled steroids: Secondary | ICD-10-CM | POA: Diagnosis not present

## 2019-03-25 DIAGNOSIS — F329 Major depressive disorder, single episode, unspecified: Secondary | ICD-10-CM | POA: Diagnosis present

## 2019-03-25 DIAGNOSIS — K921 Melena: Secondary | ICD-10-CM | POA: Diagnosis not present

## 2019-03-25 DIAGNOSIS — Z1159 Encounter for screening for other viral diseases: Secondary | ICD-10-CM | POA: Diagnosis not present

## 2019-03-25 DIAGNOSIS — C189 Malignant neoplasm of colon, unspecified: Secondary | ICD-10-CM | POA: Diagnosis not present

## 2019-03-25 DIAGNOSIS — C3490 Malignant neoplasm of unspecified part of unspecified bronchus or lung: Secondary | ICD-10-CM | POA: Diagnosis present

## 2019-03-25 DIAGNOSIS — F419 Anxiety disorder, unspecified: Secondary | ICD-10-CM | POA: Diagnosis present

## 2019-03-25 DIAGNOSIS — J449 Chronic obstructive pulmonary disease, unspecified: Secondary | ICD-10-CM | POA: Diagnosis present

## 2019-03-25 DIAGNOSIS — M5030 Other cervical disc degeneration, unspecified cervical region: Secondary | ICD-10-CM | POA: Diagnosis present

## 2019-03-25 DIAGNOSIS — M4810 Ankylosing hyperostosis [Forestier], site unspecified: Secondary | ICD-10-CM | POA: Diagnosis present

## 2019-03-25 DIAGNOSIS — C187 Malignant neoplasm of sigmoid colon: Secondary | ICD-10-CM | POA: Diagnosis present

## 2019-03-25 DIAGNOSIS — E785 Hyperlipidemia, unspecified: Secondary | ICD-10-CM | POA: Diagnosis present

## 2019-03-25 DIAGNOSIS — F4310 Post-traumatic stress disorder, unspecified: Secondary | ICD-10-CM | POA: Diagnosis present

## 2019-03-25 DIAGNOSIS — M4802 Spinal stenosis, cervical region: Secondary | ICD-10-CM | POA: Diagnosis present

## 2019-03-25 DIAGNOSIS — M533 Sacrococcygeal disorders, not elsewhere classified: Secondary | ICD-10-CM | POA: Diagnosis present

## 2019-03-25 DIAGNOSIS — M5136 Other intervertebral disc degeneration, lumbar region: Secondary | ICD-10-CM | POA: Diagnosis present

## 2019-03-25 DIAGNOSIS — I10 Essential (primary) hypertension: Secondary | ICD-10-CM | POA: Diagnosis present

## 2019-03-25 DIAGNOSIS — N390 Urinary tract infection, site not specified: Secondary | ICD-10-CM | POA: Diagnosis present

## 2019-03-25 LAB — BASIC METABOLIC PANEL
Anion gap: 11 (ref 5–15)
BUN: 14 mg/dL (ref 8–23)
CO2: 22 mmol/L (ref 22–32)
Calcium: 8.5 mg/dL — ABNORMAL LOW (ref 8.9–10.3)
Chloride: 103 mmol/L (ref 98–111)
Creatinine, Ser: 0.67 mg/dL (ref 0.44–1.00)
GFR calc Af Amer: >60 mL/min (ref >60)
GFR calc non Af Amer: >60 mL/min (ref >60)
Glucose, Bld: 119 mg/dL — ABNORMAL HIGH (ref 70–99)
Potassium: 3.3 mmol/L — ABNORMAL LOW (ref 3.5–5.1)
Sodium: 136 mmol/L (ref 135–145)

## 2019-03-25 LAB — URINE CULTURE

## 2019-03-25 LAB — CBC
HCT: 31.1 % — ABNORMAL LOW (ref 36.0–46.0)
Hemoglobin: 10.3 g/dL — ABNORMAL LOW (ref 12.0–15.0)
MCH: 27.2 pg (ref 26.0–34.0)
MCHC: 33.1 g/dL (ref 30.0–36.0)
MCV: 82.1 fL (ref 80.0–100.0)
Platelets: 162 10*3/uL (ref 150–400)
RBC: 3.79 MIL/uL — ABNORMAL LOW (ref 3.87–5.11)
RDW: 13.7 % (ref 11.5–15.5)
WBC: 5.4 10*3/uL (ref 4.0–10.5)
nRBC: 0 % (ref 0.0–0.2)

## 2019-03-25 LAB — LACTIC ACID, PLASMA
Lactic Acid, Venous: 2.5 mmol/L (ref 0.5–1.9)
Lactic Acid, Venous: 1.9 mmol/L (ref 0.5–1.9)

## 2019-03-25 MED ORDER — MORPHINE SULFATE (PF) 2 MG/ML IV SOLN
2.0000 mg | INTRAVENOUS | Status: DC | PRN
  Administered 2019-03-25 – 2019-03-27 (×9): 2 mg via INTRAVENOUS
  Filled 2019-03-25 (×9): qty 1

## 2019-03-25 MED ORDER — TRAMADOL HCL 50 MG PO TABS
50.0000 mg | ORAL_TABLET | Freq: Four times a day (QID) | ORAL | Status: DC | PRN
  Filled 2019-03-25: qty 1

## 2019-03-25 MED ORDER — ENOXAPARIN SODIUM 40 MG/0.4ML ~~LOC~~ SOLN
40.0000 mg | SUBCUTANEOUS | Status: DC
  Filled 2019-03-25: qty 0.4

## 2019-03-25 MED ORDER — ONDANSETRON HCL 4 MG PO TABS: 4.0000 mg | ORAL_TABLET | Freq: Four times a day (QID) | ORAL | Status: DC | PRN

## 2019-03-25 MED ORDER — POTASSIUM CHLORIDE CRYS ER 20 MEQ PO TBCR
40.0000 meq | EXTENDED_RELEASE_TABLET | Freq: Once | ORAL | Status: AC
  Administered 2019-03-25: 10:00:00 40 meq via ORAL
  Filled 2019-03-25: qty 2

## 2019-03-25 MED ORDER — ACETAMINOPHEN 325 MG PO TABS
650.0000 mg | ORAL_TABLET | Freq: Four times a day (QID) | ORAL | Status: DC | PRN
  Filled 2019-03-25: qty 2

## 2019-03-25 MED ORDER — ACETAMINOPHEN 650 MG RE SUPP: 650.0000 mg | Freq: Four times a day (QID) | RECTAL | Status: DC | PRN

## 2019-03-25 MED ORDER — SODIUM CHLORIDE 0.9% FLUSH
3.0000 mL | Freq: Two times a day (BID) | INTRAVENOUS | Status: DC
  Administered 2019-03-26 – 2019-03-27 (×2): 3 mL via INTRAVENOUS

## 2019-03-25 MED ORDER — SODIUM CHLORIDE 0.9 % IV SOLN
INTRAVENOUS | Status: DC
  Administered 2019-03-25 – 2019-03-26 (×4): via INTRAVENOUS

## 2019-03-25 MED ORDER — SODIUM CHLORIDE 0.9 % IV SOLN
250.0000 mL | INTRAVENOUS | Status: DC | PRN
  Administered 2019-03-26: 23:00:00 250 mL via INTRAVENOUS
  Administered 2019-03-27: 13:00:00 via INTRAVENOUS

## 2019-03-25 MED ORDER — ONDANSETRON HCL 4 MG/2ML IJ SOLN: 4.0000 mg | Freq: Four times a day (QID) | INTRAMUSCULAR | Status: DC | PRN

## 2019-03-25 MED ORDER — SODIUM CHLORIDE 0.9% FLUSH: 3.0000 mL | INTRAVENOUS | Status: DC | PRN

## 2019-03-25 MED ORDER — SODIUM CHLORIDE 0.9% FLUSH
3.0000 mL | Freq: Two times a day (BID) | INTRAVENOUS | Status: DC
  Administered 2019-03-26: 3 mL via INTRAVENOUS

## 2019-03-25 NOTE — ED Notes (Addendum)
ED TO INPATIENT HANDOFF REPORT  ED Nurse Name and Phone #: Antonela Freiman [phone]  S Name/Age/Gender Gloria Allen 73 y.o. female Room/Bed: ED12A/ED12A  Code Status   Code Status: Full Code  Home/SNF/Other Home Patient oriented to: self place time situation Is this baseline? Yes   Triage Complete: Triage complete  Chief Complaint nausea, vomiting and abd pain  Triage Note Patient reports she had a chemo treatment and radiation treatment last week for colon cancer. States since then, she has had worsening abdominal pain, nausea and vomiting. States this is the first time she has had this response to a treatment. Denies contact with anyone who has been sick.   Allergies Allergies  Allergen Reactions  . Ace Inhibitors Swelling  . Angiotensin Receptor Blockers Swelling  . Sulfa Antibiotics Itching  . Chlorthalidone Rash  . Flexeril [Cyclobenzaprine] Rash    Level of Care/Admitting Diagnosis ED Disposition    ED Disposition Condition [facility] [facility]: [facility] [[id number]]  Level of Care: Med-Surg [16]  Covid Evaluation: Screening Protocol (No Symptoms)  Diagnosis: Sepsis [facility]) [[id number]]  Admitting Physician: Mayer Camel [[id number]]  Attending Physician: Mayer Camel [[id number]]  Estimated length of stay: past midnight tomorrow  Certification:: I certify this patient will need inpatient services for at least 2 midnights  PT Class (Do Not Modify): Inpatient [101]  PT Acc Code (Do Not Modify): Private [1]       B Medical/Surgery History Past Medical History:  Diagnosis Date  . Anxiety   . Arthritis   . Asthma   . Cancer ([place])   . Cervical central spinal stenosis (C3-C7) (worse at C4-5) 07/30/2017  . Cervical foraminal stenosis (C4-5 and C5-6) (Bilateral) (L>R) 07/30/2017  . Chronic lower extremity pain (Fourth Area of Pain) (Bilateral) (R>L) 06/11/2017  . Chronic neck pain (Primary Area of Pain) (Bilateral) (R>L) 06/11/2017  . Chronic  sacroiliac joint pain (Bilateral) (R>L) 06/11/2017  . Chronic shoulder pain [facility] Area of Pain) (Right) 06/11/2017  . Chronic upper extremity pain (Fifth Area of Pain) (Bilateral) (R>L) 06/11/2017  . Colon cancer ([place])   . DDD (degenerative disc disease), cervical 07/30/2017  . DDD (degenerative disc disease), lumbar 07/30/2017  . Depression   . DISH (diffuse idiopathic skeletal hyperostosis) 07/30/2017  . Dyspnea    with exertion  . Entrapment syndrome 06/20/2017   2002 on R and 2010 on L  . Full thickness rotator cuff tear 06/12/2017  . GERD (gastroesophageal reflux disease)   . Grade 1 Anterolisthesis of L4 over L5 07/30/2017  . Headache   . Hx: UTI (urinary tract infection)   . Hypertension   . Inflammation of joint of shoulder region 01/15/2017  . Lumbar central spinal stenosis (L4-5) 07/30/2017  . Lumbar disc protrusion (Left: L5-S1) (Right: L1-2) 07/30/2017   L5-S1 left foraminal protrusion with L5 impingement. L1-2 right paracentral protrusion without impingement.  . Lumbar facet arthropathy (Bilateral) 07/30/2017  . Lumbar facet syndrome (Bilateral) (R>L) 07/30/2017  . Lung cancer ([place])   . Osteoarthritis of shoulder (Right) 01/15/2017   Past Surgical History:  Procedure Laterality Date  . ABDOMINAL HYSTERECTOMY    . APPENDECTOMY    . BREAST REDUCTION SURGERY Bilateral   . COLONOSCOPY WITH PROPOFOL N/A 11/05/2018   Procedure: COLONOSCOPY WITH PROPOFOL;  Surgeon: Virgel Manifold, MD;  Location: [facility] ENDOSCOPY;  Service: Endoscopy;  Laterality: N/A;  . COLONOSCOPY WITH PROPOFOL N/A 11/06/2018   Procedure: COLONOSCOPY WITH PROPOFOL;  Surgeon: Virgel Manifold, MD;  Location: [facility] ENDOSCOPY;  Service: Endoscopy;  Laterality: N/A;  . COLOSTOMY    . DILATION AND CURETTAGE OF UTERUS    . ESOPHAGOGASTRODUODENOSCOPY (EGD) WITH PROPOFOL N/A 11/05/2018   Procedure: ESOPHAGOGASTRODUODENOSCOPY (EGD) WITH PROPOFOL;  Surgeon: Virgel Manifold, MD;  Location: [facility] ENDOSCOPY;   Service: Endoscopy;  Laterality: N/A;  . ILEOSTOMY Right 11/27/2018   Procedure: ILEOSTOMY;  Surgeon: Jules Husbands, MD;  Location: [facility] ORS;  Service: General;  Laterality: Right;  . LAPAROSCOPIC SIGMOID COLECTOMY N/A 11/27/2018   Procedure: LAPAROSCOPIC SIGMOID COLECTOMY;  Surgeon: Jules Husbands, MD;  Location: [facility] ORS;  Service: General;  Laterality: N/A;  . ORIF ANKLE FRACTURE Right 11/21/2018   Procedure: OPEN REDUCTION INTERNAL FIXATION (ORIF) ANKLE FRACTURE;  Surgeon: Corky Mull, MD;  Location: [facility] ORS;  Service: Orthopedics;  Laterality: Right;  . PORTACATH PLACEMENT N/A 03/12/2019   Procedure: INSERTION PORT-A-CATH;  Surgeon: Jules Husbands, MD;  Location: [facility] ORS;  Service: General;  Laterality: N/A;  . REDUCTION MAMMAPLASTY    . SHOULDER ARTHROSCOPY WITH OPEN ROTATOR CUFF REPAIR AND DISTAL CLAVICLE ACROMINECTOMY Right 03/26/2017   Procedure: right shoulder arthroscopy, arthroscopic subacromial decompression, distal clavicle excision, mini open rotator cuff repair;  Surgeon: Thornton Park, MD;  Location: [facility] ORS;  Service: Orthopedics;  Laterality: Right;     A IV Location/Drains/Wounds Patient Lines/Drains/Airways Status   Active Line/Drains/Airways    Name:   Placement date:   Placement time:   Site:   Days:   Implanted Port 03/12/19 Right Chest   03/12/19    1317    Chest   13   Peripheral IV 03/25/19 Left Forearm   03/25/19    0820    Forearm   less than 1   Closed System Drain 1 Midline;Distal Abdomen Bulb (JP)   11/27/18    1521    Abdomen   118   Ileostomy Loop RLQ   11/27/18    2030    RLQ   118   Incision (Closed) 11/21/18 Ankle Right   11/21/18    2230     124   Incision (Closed) 11/27/18 Abdomen Other (Comment)   11/27/18    1556     118   Incision (Closed) 11/27/18 Rectum Other (Comment)   11/27/18    1556     118   Incision (Closed) 03/12/19 Chest Right   03/12/19    1327     13   Incision (Closed) 03/12/19 Neck Right   03/12/19    1327     13   Incision - 3  Ports Abdomen Right;Lower Right;Mid;Lower Left;Distal   11/27/18    1511     118          Intake/Output Last 24 hours  Intake/Output Summary (Last 24 hours) at 03/25/2019 0836 Last data filed at 03/25/2019 0300 Gross per 24 hour  Intake 291.13 ml  Output -  Net 291.13 ml    Labs/Imaging Results for orders placed or performed during the hospital encounter of 03/24/19 (from the past 48 hour(s))  Lactic acid, plasma     Status: Abnormal   Collection Time: 03/24/19  7:34 PM  Result Value Ref Range   Lactic Acid, Venous 3.9 (HH) 0.5 - 1.9 mmol/L    Comment: CRITICAL RESULT CALLED TO, READ BACK BY AND VERIFIED WITH SAMANTHA HAMILTON AT 2031 03/24/2019.  TFK Performed at [facility], [address] [place], [place] [address]   Comprehensive metabolic panel  Status: Abnormal   Collection Time: 03/24/19  7:53 PM  Result Value Ref Range   Sodium 132 (L) 135 - 145 mmol/L   Potassium 2.9 (L) 3.5 - 5.1 mmol/L   Chloride 95 (L) 98 - 111 mmol/L   CO2 22 22 - 32 mmol/L   Glucose, Bld 168 (H) 70 - 99 mg/dL   BUN 17 8 - 23 mg/dL   Creatinine, Ser 0.81 0.44 - 1.00 mg/dL   Calcium 9.3 8.9 - 10.3 mg/dL   Total Protein 8.3 (H) 6.5 - 8.1 g/dL   Albumin 4.6 3.5 - 5.0 g/dL   AST 27 15 - 41 U/L   ALT 31 0 - 44 U/L   Alkaline Phosphatase 142 (H) 38 - 126 U/L   Total Bilirubin 0.8 0.3 - 1.2 mg/dL   GFR calc non Af Amer >60 >60 mL/min   GFR calc Af Amer >60 >60 mL/min   Anion gap 15 5 - 15    Comment: Performed at [facility], [address] [place], [place] [address]  Lipase, blood     Status: None   Collection Time: 03/24/19  7:53 PM  Result Value Ref Range   Lipase 23 11 - 51 U/L    Comment: Performed at [facility], [address] [place], [place] [address]  CBC with Differential     Status: Abnormal   Collection Time: 03/24/19  7:53 PM  Result Value Ref Range   WBC 11.8 (H) 4.0 - 10.5 K/uL   RBC 4.70 3.87 - 5.11 MIL/uL   Hemoglobin 12.8 12.0 -  15.0 g/dL   HCT 38.0 36.0 - 46.0 %   MCV 80.9 80.0 - 100.0 fL   MCH 27.2 26.0 - 34.0 pg   MCHC 33.7 30.0 - 36.0 g/dL   RDW 13.7 11.5 - 15.5 %   Platelets 250 150 - 400 K/uL   nRBC 0.0 0.0 - 0.2 %   Neutrophils Relative % 78 %   Neutro Abs 9.6 (H) 1.7 - 7.7 K/uL   Lymphocytes Relative 10 %   Lymphs Abs 1.2 0.7 - 4.0 K/uL   Monocytes Relative 3 %   Monocytes Absolute 0.3 0.1 - 1.0 K/uL   Eosinophils Relative 0 %   Eosinophils Absolute 0.0 0.0 - 0.5 K/uL   Basophils Relative 1 %   Basophils Absolute 0.1 0.0 - 0.1 K/uL   WBC Morphology DOHLE BODIES    Smear Review See Note     Comment: FEW PLATELET CLUMPS SEEN ON SMEAR MJU   Immature Granulocytes 8 %   Abs Immature Granulocytes 0.92 (H) 0.00 - 0.07 K/uL   Dohle Bodies PRESENT     Comment: Performed at [facility], [address] [place], [place] [address]  Lactic acid, plasma     Status: Abnormal   Collection Time: 03/24/19  9:59 PM  Result Value Ref Range   Lactic Acid, Venous 2.7 (HH) 0.5 - 1.9 mmol/L    Comment: CRITICAL RESULT CALLED TO, READ BACK BY AND VERIFIED WITH  Terri Piedra [facility] AT 2329 03/24/2019 SDR Performed at [facility], [address], [place], [place] [address]   Basic metabolic panel     Status: Abnormal   Collection Time: 03/25/19  5:46 AM  Result Value Ref Range   Sodium 136 135 - 145 mmol/L   Potassium 3.3 (L) 3.5 - 5.1 mmol/L   Chloride 103 98 - 111 mmol/L   CO2 22 22 - 32 mmol/L   Glucose,  Bld 119 (H) 70 - 99 mg/dL   BUN 14 8 - 23 mg/dL   Creatinine, Ser 0.67 0.44 - 1.00 mg/dL   Calcium 8.5 (L) 8.9 - 10.3 mg/dL   GFR calc non Af Amer >60 >60 mL/min   GFR calc Af Amer >60 >60 mL/min   Anion gap 11 5 - 15    Comment: Performed at [facility], [address] [place], [place] [address]  CBC     Status: Abnormal   Collection Time: 03/25/19  5:46 AM  Result Value Ref Range   WBC 5.4 4.0 - 10.5 K/uL   RBC 3.79 (L) 3.87 - 5.11 MIL/uL   Hemoglobin 10.3 (L) 12.0 - 15.0  g/dL   HCT 31.1 (L) 36.0 - 46.0 %   MCV 82.1 80.0 - 100.0 fL   MCH 27.2 26.0 - 34.0 pg   MCHC 33.1 30.0 - 36.0 g/dL   RDW 13.7 11.5 - 15.5 %   Platelets 162 150 - 400 K/uL   nRBC 0.0 0.0 - 0.2 %    Comment: Performed at [facility], [address] [place], [place] [address]   Ct Chest W Contrast  Result Date: 03/24/2019 CLINICAL DATA:  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 Diagnosis May 2020. Currently undergoing chemotherapy and radiation. EXAM: CT CHEST, ABDOMEN, AND PELVIS WITH CONTRAST TECHNIQUE: Multidetector CT imaging of the chest, abdomen and pelvis was performed following the standard protocol during bolus administration of intravenous contrast. CONTRAST:  117mL OMNIPAQUE IOHEXOL 300 MG/ML  SOLN COMPARISON:  PET-CT 02/05/2019 FINDINGS: CT CHEST FINDINGS Cardiovascular: The heart is normal in size. No pericardial effusion. The aorta is normal in caliber. Moderate atherosclerotic calcifications. No dissection. The branch vessels are patent. Stable coronary artery calcifications. Mediastinum/Nodes: No mediastinal or hilar mass or lymphadenopathy. The esophagus is unremarkable. Lungs/Pleura: Stable underlying emphysematous changes. The right upper lobe pulmonary lesion measures 7.5 mm on image number 58 and previously measured 15.5 mm. Semi solid nodular left upper lobe lesion peripherally on image number 85 measures 18 mm and is unchanged. This was not hypermetabolic on the recent PET-CT but recommend continued observation/surveillance. A few tiny scattered subpleural nodules are stable and likely represent lymph nodes. No infiltrates or effusions. Musculoskeletal: No breast masses, supraclavicular or axillary adenopathy. The right-sided Port-A-Cath is stable. The thyroid gland is unremarkable. Stable small right nodule. No significant bony findings. CT ABDOMEN PELVIS FINDINGS Hepatobiliary: Stable low-attenuation lesion in segment 6 most consistent with a benign  hepatic cyst. No worrisome hepatic lesions or intrahepatic biliary dilatation. The gallbladder is surgically absent. Mild associated common bile duct dilatation. Pancreas: No mass, inflammation or ductal dilatation. Spleen: Normal size.  No focal lesions. Adrenals/Urinary Tract: The adrenal glands are normal in stable. No renal, ureteral or bladder calculi or mass. Stomach/Bowel: The stomach, duodenum, small bowel and colon are unremarkable. No acute inflammatory changes, mass lesions or obstructive findings. Patient has a loop ileostomy in the right lower quadrant. Prior left hemicolectomy with transverse colon to rectal anastomosis. There is dense contrast in the rectum with moderate artifact but no findings suspicious for residual or recurrent tumor. Vascular/Lymphatic: Stable aortic and iliac artery calcifications but no aneurysm. The major venous structures are patent. No mesenteric or retroperitoneal mass or adenopathy. No pelvic lymphadenopathy. Reproductive: The uterus is surgically absent. Both ovaries are still present and appear normal. There is a simple appearing cyst associated with the left ovary. Other: No free pelvic fluid collections or pelvic abscess. No  inguinal mass or adenopathy. No subcutaneous lesions. Musculoskeletal: No significant bony findings. IMPRESSION: 1. Interval decrease in size of the right upper lobe pulmonary nodule now measuring 7.5 mm and previously measuring 15.5 mm. 2. Stable 18 mm semi-solid airspace nodule in the left upper lobe. Recommend continued surveillance. 3. No mediastinal or hilar mass or adenopathy. 4. Stable surgical changes involving the colon. No findings suspicious for recurrent tumor, locoregional adenopathy or distant metastatic disease. Electronically Signed   By: Marijo Sanes M.D.   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 Diagnosis May 2020.  Currently undergoing chemotherapy and radiation. EXAM: CT CHEST, ABDOMEN, AND PELVIS WITH CONTRAST TECHNIQUE: Multidetector CT imaging of the chest, abdomen and pelvis was performed following the standard protocol during bolus administration of intravenous contrast. CONTRAST:  12mL OMNIPAQUE IOHEXOL 300 MG/ML  SOLN COMPARISON:  PET-CT 02/05/2019 FINDINGS: CT CHEST FINDINGS Cardiovascular: The heart is normal in size. No pericardial effusion. The aorta is normal in caliber. Moderate atherosclerotic calcifications. No dissection. The branch vessels are patent. Stable coronary artery calcifications. Mediastinum/Nodes: No mediastinal or hilar mass or lymphadenopathy. The esophagus is unremarkable. Lungs/Pleura: Stable underlying emphysematous changes. The right upper lobe pulmonary lesion measures 7.5 mm on image number 58 and previously measured 15.5 mm. Semi solid nodular left upper lobe lesion peripherally on image number 85 measures 18 mm and is unchanged. This was not hypermetabolic on the recent PET-CT but recommend continued observation/surveillance. A few tiny scattered subpleural nodules are stable and likely represent lymph nodes. No infiltrates or effusions. Musculoskeletal: No breast masses, supraclavicular or axillary adenopathy. The right-sided Port-A-Cath is stable. The thyroid gland is unremarkable. Stable small right nodule. No significant bony findings. CT ABDOMEN PELVIS FINDINGS Hepatobiliary: Stable low-attenuation lesion in segment 6 most consistent with a benign hepatic cyst. No worrisome hepatic lesions or intrahepatic biliary dilatation. The gallbladder is surgically absent. Mild associated common bile duct dilatation. Pancreas: No mass, inflammation or ductal dilatation. Spleen: Normal size.  No focal lesions. Adrenals/Urinary Tract: The adrenal glands are normal in stable. No renal, ureteral or bladder calculi or mass. Stomach/Bowel: The stomach, duodenum, small bowel and colon are  unremarkable. No acute inflammatory changes, mass lesions or obstructive findings. Patient has a loop ileostomy in the right lower quadrant. Prior left hemicolectomy with transverse colon to rectal anastomosis. There is dense contrast in the rectum with moderate artifact but no findings suspicious for residual or recurrent tumor. Vascular/Lymphatic: Stable aortic and iliac artery calcifications but no aneurysm. The major venous structures are patent. No mesenteric or retroperitoneal mass or adenopathy. No pelvic lymphadenopathy. Reproductive: The uterus is surgically absent. Both ovaries are still present and appear normal. There is a simple appearing cyst associated with the left ovary. Other: No free pelvic fluid collections or pelvic abscess. No inguinal mass or adenopathy. No subcutaneous lesions. Musculoskeletal: No significant bony findings. IMPRESSION: 1. Interval decrease in size of the right upper lobe pulmonary nodule now measuring 7.5 mm and previously measuring 15.5 mm. 2. Stable 18 mm semi-solid airspace nodule in the left upper lobe. Recommend continued surveillance. 3. No mediastinal or hilar mass or adenopathy. 4. Stable surgical changes involving the colon. No findings suspicious for recurrent tumor, locoregional adenopathy or distant metastatic disease. Electronically Signed   By: Marijo Sanes M.D.   On: 03/24/2019 15:26    Pending Labs [employer] (From admission, onward)    Start  Ordered   04/01/19 0500  Creatinine, serum  (enoxaparin (LOVENOX)    CrCl < 30 ml/min)  Weekly,   STAT    Comments: while on enoxaparin therapy.    03/25/19 0221   03/25/19 0806  Lactic acid, plasma  Once,   STAT     03/25/19 0805   03/24/19 2315  Urine Culture  Once,   STAT    Question:  Patient immune status  Answer:  Immunocompromised   03/24/19 2314   03/24/19 2103  Novel Coronavirus,NAA,(SEND-OUT TO REF LAB - TAT 24-48 hrs); Hosp Order  (Asymptomatic Patients Labs)  Once,   STAT    Question:   Rule Out  Answer:  Yes   03/24/19 2102   03/24/19 1953  Pathologist smear review  Once,   STAT     03/24/19 1953          Vitals/Pain Today's Vitals   03/25/19 0000 03/25/19 0709 03/25/19 0710 03/25/19 0711  BP: (!) 186/75  (!) 144/81   Pulse: 92   76  Resp:      Temp:      TempSrc:      SpO2: 100%   100%  Weight:      Height:      PainSc:  8       Isolation Precautions No active isolations  Medications Medications  cloNIDine (CATAPRES) tablet 0.3 mg (0.3 mg Oral Given 03/25/19 0048)  hydrALAZINE (APRESOLINE) tablet 100 mg (100 mg Oral Given 03/25/19 0051)  pravastatin (PRAVACHOL) tablet 40 mg (has no administration in time range)  megestrol (MEGACE) 400 MG/10ML suspension 400 mg (has no administration in time range)  methocarbamol (ROBAXIN) tablet 500 mg (has no administration in time range)  metoprolol succinate (TOPROL-XL) 24 hr tablet 100 mg (has no administration in time range)  mirtazapine (REMERON) tablet 15 mg (15 mg Oral Given 03/25/19 0047)  montelukast (SINGULAIR) tablet 10 mg (has no administration in time range)  OLANZapine (ZYPREXA) tablet 10 mg (0 mg Oral Hold 03/25/19 0443)  pantoprazole (PROTONIX) EC tablet 40 mg (has no administration in time range)  oxybutynin (DITROPAN) tablet 5 mg (0 mg Oral Hold 03/25/19 0442)  sertraline (ZOLOFT) tablet 200 mg (200 mg Oral Given 03/25/19 0049)  terazosin (HYTRIN) capsule 1 mg (0 mg Oral Hold 03/25/19 0442)  traZODone (DESYREL) tablet 25-50 mg (has no administration in time range)  amLODipine (NORVASC) tablet 10 mg (has no administration in time range)  cefTRIAXone (ROCEPHIN) 1 g in sodium chloride 0.9 % 100 mL IVPB (0 g Intravenous Stopped 03/25/19 0037)  0.9 %  sodium chloride infusion ( Intravenous New Bag/Given 03/24/19 2324)  enoxaparin (LOVENOX) injection 40 mg (has no administration in time range)  sodium chloride flush (NS) 0.9 % injection 3 mL (3 mLs Intravenous Not Given 03/25/19 0340)  sodium chloride flush (NS)  0.9 % injection 3 mL (3 mLs Intravenous Not Given 03/25/19 0341)  sodium chloride flush (NS) 0.9 % injection 3 mL (has no administration in time range)  0.9 %  sodium chloride infusion (has no administration in time range)  0.9 %  sodium chloride infusion ( Intravenous New Bag/Given 03/25/19 0547)  acetaminophen (TYLENOL) tablet 650 mg (has no administration in time range)    Or  acetaminophen (TYLENOL) suppository 650 mg (has no administration in time range)  ondansetron (ZOFRAN) tablet 4 mg (has no administration in time range)    Or  ondansetron (ZOFRAN) injection 4 mg (has no administration in time range)  morphine 2 MG/ML  injection 2 mg (2 mg Intravenous Given 03/25/19 0804)  traMADol (ULTRAM) tablet 50 mg (has no administration in time range)  potassium chloride SA (K-DUR) CR tablet 40 mEq (has no administration in time range)  sodium chloride 0.9 % bolus 1,000 mL (0 mLs Intravenous Stopped 03/24/19 2155)  potassium chloride 10 mEq in 100 mL IVPB (0 mEq Intravenous Stopped 03/24/19 2154)  cefTRIAXone (ROCEPHIN) 1 g in sodium chloride 0.9 % 100 mL IVPB (0 g Intravenous Stopped 03/24/19 2040)  morphine 4 MG/ML injection 4 mg (4 mg Intravenous Given 03/24/19 2040)  ondansetron (ZOFRAN) injection 4 mg (4 mg Intravenous Given 03/24/19 2040)  hydrALAZINE (APRESOLINE) tablet 50 mg (50 mg Oral Given 03/24/19 2216)  potassium chloride 10 mEq in 100 mL IVPB (0 mEq Intravenous Stopped 03/25/19 0300)    Mobility walks Low fall risk   Focused Assessments GI assessment: Pt with colostomy bag post colon reduction r/t colon CA.    R Recommendations: See Admitting Provider Note  Report given to:   Additional Notes:

## 2019-03-25 NOTE — ED Notes (Signed)
Pt asleep, woke up when this rn went in room, Pt ambulated to restroom, denies any other needs at this time. Pt stable, will continue to monitor.

## 2019-03-25 NOTE — Progress Notes (Signed)
[facility] NAME: Gloria Allen    MR#:  [id number]  DATE OF BIRTH:  02/05/1946  SUBJECTIVE:   Patient is endorsing some suprapubic abdominal pain this morning.  She denies any fevers or chills.  No other concerns today.  REVIEW OF SYSTEMS:  Review of Systems  Constitutional: Negative for chills and fever.  HENT: Negative for congestion and sore throat.   Eyes: Negative for blurred vision and double vision.  Respiratory: Negative for cough and shortness of breath.   Cardiovascular: Negative for chest pain and palpitations.  Gastrointestinal: Positive for abdominal pain. Negative for nausea and vomiting.  Genitourinary: Negative for dysuria and urgency.  Musculoskeletal: Negative for back pain and neck pain.  Neurological: Negative for dizziness and headaches.  Psychiatric/Behavioral: Negative for depression. The patient is not nervous/anxious.     DRUG ALLERGIES:   Allergies  Allergen Reactions  . Ace Inhibitors Swelling  . Angiotensin Receptor Blockers Swelling  . Sulfa Antibiotics Itching  . Chlorthalidone Rash  . Flexeril [Cyclobenzaprine] Rash   VITALS:  Blood pressure (!) 117/46, pulse 62, temperature 98.7 F (37.1 C), temperature source Oral, resp. rate 20, height 5\' 5"  (1.651 m), weight 66.7 kg, SpO2 99 %. PHYSICAL EXAMINATION:  Physical Exam  GENERAL:  Laying in the bed with no acute distress.  HEENT: Head atraumatic, normocephalic. Pupils equal, round, reactive to light and accommodation. No scleral icterus. Extraocular muscles intact. Oropharynx and nasopharynx clear.  NECK:  Supple, no jugular venous distention. No thyroid enlargement. LUNGS: Lungs are clear to auscultation bilaterally. No wheezes, crackles, rhonchi. No use of accessory muscles of respiration.  CARDIOVASCULAR: RRR, S1, S2 normal. No murmurs, rubs, or gallops.  ABDOMEN: Soft, nondistended. Bowel sounds present. + Suprapubic abdominal pain, no  rebound or guarding. EXTREMITIES: No pedal edema, cyanosis, or clubbing.  NEUROLOGIC: CN 2-12 intact, no focal deficits. 5/5 muscle strength throughout all extremities. Sensation intact throughout. Gait not checked.  PSYCHIATRIC: The patient is alert and oriented x 3.  SKIN: No obvious rash, lesion, or ulcer.  LABORATORY PANEL:  Female CBC Recent Labs  Lab 03/25/19 0546  WBC 5.4  HGB 10.3*  HCT 31.1*  PLT 162   ------------------------------------------------------------------------------------------------------------------ Chemistries  Recent Labs  Lab 03/24/19 1953 03/25/19 0546  NA 132* 136  K 2.9* 3.3*  CL 95* 103  CO2 22 22  GLUCOSE 168* 119*  BUN 17 14  CREATININE 0.81 0.67  CALCIUM 9.3 8.5*  AST 27  --   ALT 31  --   ALKPHOS 142*  --   BILITOT 0.8  --    RADIOLOGY:  No results found. ASSESSMENT AND PLAN:   Sepsis- likely secondary to UTI.  Sepsis is resolving.  Lactic acidosis improving -Continue ceftriaxone -Blood and urine cultures -Continue IV fluids  Hypokalemia -Replete and recheck  Colon cancer and small cell lung cancer -Patient is being followed by the cancer center receiving IV chemotherapy and radiation therapy  Hypertension -Continue Norvasc, hydralazine, and clonidine   All the records are reviewed and case discussed with Care Management/Social Worker. Management plans discussed with the patient, family and they are in agreement.  CODE STATUS: Full Code  TOTAL TIME TAKING CARE OF THIS PATIENT: 40 minutes.   More than 50% of the time was spent in counseling/coordination of care: YES  POSSIBLE D/C IN 1-2 DAYS, DEPENDING ON CLINICAL CONDITION.   Berna Spare Dontez Hauss M.D on 03/25/2019 at 4:11 PM  Between 7am to 6pm -  Pager - [phone]  After 6pm go to [email or web address] - [employer] brewer [facility]  Office  [phone]  CC: Primary care physician; [employer]  Note: This dictation was  prepared with Diplomatic Services operational officer dictation along with smaller Company secretary. Any transcriptional errors that result from this process are unintentional.

## 2019-03-25 NOTE — Progress Notes (Signed)
Hematology/Oncology Consult note [facility]  Telephone:([phone] [phone]  Patient Care Team: Inc, [facility] as PCP - General Telford Nab, RN as Registered Nurse   Name of the patient: Gloria Allen  [id number]  10-12-1945    Interval history- reports that her abdominal pain is better. Has not had significant nausea. She felt like she was going to die when she came to the hospital  ECOG PS- 1 Pain scale- 0   Review of systems- Review of Systems  Constitutional: Positive for malaise/fatigue. Negative for chills, fever and weight loss.  HENT: Negative for congestion, ear discharge and nosebleeds.   Eyes: Negative for blurred vision.  Respiratory: Negative for cough, hemoptysis, sputum production, shortness of breath and wheezing.   Cardiovascular: Negative for chest pain, palpitations, orthopnea and claudication.  Gastrointestinal: Positive for abdominal pain. Negative for blood in stool, constipation, diarrhea, heartburn, melena, nausea and vomiting.  Genitourinary: Negative for dysuria, flank pain, frequency, hematuria and urgency.  Musculoskeletal: Negative for back pain, joint pain and myalgias.  Skin: Negative for rash.  Neurological: Negative for dizziness, tingling, focal weakness, seizures, weakness and headaches.  Endo/Heme/Allergies: Does not bruise/bleed easily.  Psychiatric/Behavioral: Negative for depression and suicidal ideas. The patient does not have insomnia.       Allergies  Allergen Reactions  . Ace Inhibitors Swelling  . Angiotensin Receptor Blockers Swelling  . Sulfa Antibiotics Itching  . Chlorthalidone Rash  . Flexeril [Cyclobenzaprine] Rash     Past Medical History:  Diagnosis Date  . Anxiety   . Arthritis   . Asthma   . Cancer ([place])   . Cervical central spinal stenosis (C3-C7) (worse at C4-5) 07/30/2017  . Cervical foraminal stenosis (C4-5 and C5-6) (Bilateral) (L>R) 07/30/2017  .  Chronic lower extremity pain (Fourth Area of Pain) (Bilateral) (R>L) 06/11/2017  . Chronic neck pain (Primary Area of Pain) (Bilateral) (R>L) 06/11/2017  . Chronic sacroiliac joint pain (Bilateral) (R>L) 06/11/2017  . Chronic shoulder pain [facility]) (Right) 06/11/2017  . Chronic upper extremity pain (Fifth Area of Pain) (Bilateral) (R>L) 06/11/2017  . Colon cancer ([place])   . DDD (degenerative disc disease), cervical 07/30/2017  . DDD (degenerative disc disease), lumbar 07/30/2017  . Depression   . DISH (diffuse idiopathic skeletal hyperostosis) 07/30/2017  . Dyspnea    with exertion  . Entrapment syndrome 06/20/2017   2002 on R and 2010 on L  . Full thickness rotator cuff tear 06/12/2017  . GERD (gastroesophageal reflux disease)   . Grade 1 Anterolisthesis of L4 over L5 07/30/2017  . Headache   . Hx: UTI (urinary tract infection)   . Hypertension   . Inflammation of joint of shoulder region 01/15/2017  . Lumbar central spinal stenosis (L4-5) 07/30/2017  . Lumbar disc protrusion (Left: L5-S1) (Right: L1-2) 07/30/2017   L5-S1 left foraminal protrusion with L5 impingement. L1-2 right paracentral protrusion without impingement.  . Lumbar facet arthropathy (Bilateral) 07/30/2017  . Lumbar facet syndrome (Bilateral) (R>L) 07/30/2017  . Lung cancer ([place])   . Osteoarthritis of shoulder (Right) 01/15/2017     Past Surgical History:  Procedure Laterality Date  . ABDOMINAL HYSTERECTOMY    . APPENDECTOMY    . BREAST REDUCTION SURGERY Bilateral   . COLONOSCOPY WITH PROPOFOL N/A 11/05/2018   Procedure: COLONOSCOPY WITH PROPOFOL;  Surgeon: Virgel Manifold, MD;  Location: [facility] ENDOSCOPY;  Service: Endoscopy;  Laterality: N/A;  . COLONOSCOPY WITH PROPOFOL N/A 11/06/2018   Procedure: COLONOSCOPY WITH PROPOFOL;  Surgeon: Virgel Manifold, MD;  Location: [facility] ENDOSCOPY;  Service: Endoscopy;  Laterality: N/A;  . COLOSTOMY    . DILATION AND CURETTAGE OF UTERUS    .  ESOPHAGOGASTRODUODENOSCOPY (EGD) WITH PROPOFOL N/A 11/05/2018   Procedure: ESOPHAGOGASTRODUODENOSCOPY (EGD) WITH PROPOFOL;  Surgeon: Virgel Manifold, MD;  Location: [facility] ENDOSCOPY;  Service: Endoscopy;  Laterality: N/A;  . ILEOSTOMY Right 11/27/2018   Procedure: ILEOSTOMY;  Surgeon: Jules Husbands, MD;  Location: [facility] ORS;  Service: General;  Laterality: Right;  . LAPAROSCOPIC SIGMOID COLECTOMY N/A 11/27/2018   Procedure: LAPAROSCOPIC SIGMOID COLECTOMY;  Surgeon: Jules Husbands, MD;  Location: [facility] ORS;  Service: General;  Laterality: N/A;  . ORIF ANKLE FRACTURE Right 11/21/2018   Procedure: OPEN REDUCTION INTERNAL FIXATION (ORIF) ANKLE FRACTURE;  Surgeon: Corky Mull, MD;  Location: [facility] ORS;  Service: Orthopedics;  Laterality: Right;  . PORTACATH PLACEMENT N/A 03/12/2019   Procedure: INSERTION PORT-A-CATH;  Surgeon: Jules Husbands, MD;  Location: [facility] ORS;  Service: General;  Laterality: N/A;  . REDUCTION MAMMAPLASTY    . SHOULDER ARTHROSCOPY WITH OPEN ROTATOR CUFF REPAIR AND DISTAL CLAVICLE ACROMINECTOMY Right 03/26/2017   Procedure: right shoulder arthroscopy, arthroscopic subacromial decompression, distal clavicle excision, mini open rotator cuff repair;  Surgeon: Thornton Park, MD;  Location: [facility] ORS;  Service: Orthopedics;  Laterality: Right;    Social History   Socioeconomic History  . Marital status: Divorced    Spouse name: Not on file  . Number of children: 3  . Years of education: Not on file  . Highest education level: 11th grade  Occupational History  . Not on file  Social Needs  . Financial resource strain: Somewhat hard  . Food insecurity    Worry: Sometimes true    Inability: Never true  . Transportation needs    Medical: Yes    Non-medical: No  Tobacco Use  . Smoking status: Former Smoker    Packs/day: 1.00    Types: Cigarettes    Quit date: 2010    Years since quitting: 10.4  . Smokeless tobacco: Never Used  . Tobacco comment: quit over 30 years ago    Substance and Sexual Activity  . Alcohol use: No  . Drug use: Yes    Types: Marijuana  . Sexual activity: Never  Lifestyle  . Physical activity    Days per week: 0 days    Minutes per session: 0 min  . Stress: Not at all  Relationships  . Social Herbalist on phone: Never    Gets together: Never    Attends religious service: More than 4 times per year    Active member of club or organization: No    Attends meetings of clubs or organizations: Never    Relationship status: Divorced  . Intimate partner violence    Fear of current or ex partner: No    Emotionally abused: No    Physically abused: No    Forced sexual activity: No  Other Topics Concern  . 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 She receives disability. She has 3 children and several grandchildren. She lives alone. She drives. Uses a walker and shower chair at baseline.     Family History  Problem Relation Age of Onset  . Hypertension Mother   . Breast cancer Mother 58  . Breast cancer Sister 50     Current Facility-Administered Medications:  .  0.9 %  sodium chloride infusion, 250 mL, Intravenous, PRN, Seals, Angela H, NP .  0.9 %  sodium chloride infusion, , Intravenous, Continuous, Mayo, Pete Pelt, MD, Last Rate: 75 mL/hr at 03/25/19 1730 .  acetaminophen (TYLENOL) tablet 650 mg, 650 mg, Oral, Q6H PRN **OR** acetaminophen (TYLENOL) suppository 650 mg, 650 mg, Rectal, Q6H PRN, Seals, Angela H, NP .  amLODipine (NORVASC) tablet 10 mg, 10 mg, Oral, Daily, Seals, Angela H, NP, 10 mg at 03/25/19 0948 .  cefTRIAXone (ROCEPHIN) 1 g in sodium chloride 0.9 % 100 mL IVPB, 1 g, Intravenous, Q24H, Seals, Theo Dills, NP, Stopped at 03/25/19 0037 .  cloNIDine (CATAPRES) tablet 0.3 mg, 0.3 mg, Oral, BID, Seals, Angela H, NP, 0.3 mg at 03/25/19 2208 .  enoxaparin (LOVENOX) injection 40 mg, 40 mg, Subcutaneous, Q24H, Seals, Angela H, NP .  hydrALAZINE (APRESOLINE) tablet 100  mg, 100 mg, Oral, TID, Seals, Angela H, NP, 100 mg at 03/25/19 2207 .  megestrol (MEGACE) 400 MG/10ML suspension 400 mg, 400 mg, Oral, Daily, Seals, Angela H, NP, 400 mg at 03/25/19 1211 .  methocarbamol (ROBAXIN) tablet 500 mg, 500 mg, Oral, QHS PRN, Seals, Angela H, NP .  metoprolol succinate (TOPROL-XL) 24 hr tablet 100 mg, 100 mg, Oral, Daily, Seals, Angela H, NP, 100 mg at 03/25/19 0948 .  mirtazapine (REMERON) tablet 15 mg, 15 mg, Oral, QHS, Seals, Angela H, NP, 15 mg at 03/25/19 2206 .  montelukast (SINGULAIR) tablet 10 mg, 10 mg, Oral, QHS PRN, Seals, Angela H, NP .  morphine 2 MG/ML injection 2 mg, 2 mg, Intravenous, Q4H PRN, Mayo, Pete Pelt, MD, 2 mg at 03/25/19 2204 .  OLANZapine (ZYPREXA) tablet 10 mg, 10 mg, Oral, QHS, Seals, Angela H, NP, 10 mg at 03/25/19 2209 .  ondansetron (ZOFRAN) tablet 4 mg, 4 mg, Oral, Q6H PRN **OR** ondansetron (ZOFRAN) injection 4 mg, 4 mg, Intravenous, Q6H PRN, Seals, Angela H, NP .  oxybutynin (DITROPAN) tablet 5 mg, 5 mg, Oral, BID, Seals, Angela H, NP, 5 mg at 03/25/19 2206 .  pantoprazole (PROTONIX) EC tablet 40 mg, 40 mg, Oral, Daily, Seals, Angela H, NP, 40 mg at 03/25/19 0948 .  pravastatin (PRAVACHOL) tablet 40 mg, 40 mg, Oral, q1800, Seals, Angela H, NP, 40 mg at 03/25/19 1820 .  sertraline (ZOLOFT) tablet 200 mg, 200 mg, Oral, QHS, Seals, Angela H, NP, 200 mg at 03/25/19 2208 .  sodium chloride flush (NS) 0.9 % injection 3 mL, 3 mL, Intravenous, Q12H, Seals, Angela H, NP .  sodium chloride flush (NS) 0.9 % injection 3 mL, 3 mL, Intravenous, Q12H, Seals, Angela H, NP .  sodium chloride flush (NS) 0.9 % injection 3 mL, 3 mL, Intravenous, PRN, Seals, Angela H, NP .  terazosin (HYTRIN) capsule 1 mg, 1 mg, Oral, QHS, Seals, Angela H, NP, 1 mg at 03/25/19 2208 .  traMADol (ULTRAM) tablet 50 mg, 50 mg, Oral, Q6H PRN, Mayo, Pete Pelt, MD .  traZODone (DESYREL) tablet 25-50 mg, 25-50 mg, Oral, QHS PRN, Mayer Camel, NP  Physical exam:  Vitals:    03/25/19 0950 03/25/19 1450 03/25/19 2003 03/25/19 2120  BP: 137/84 (!) 117/46 139/70 132/62  Pulse: 75 62 74 61  Resp:  20 15   Temp:  98.7 F (37.1 C) (!) 100.4 F (38 C) 99.4 F (37.4 C)  TempSrc:  Oral Oral Oral  SpO2:  99% 99%   Weight:      Height:       Physical Exam Constitutional:  General: She is not in acute distress. HENT:     Head: Normocephalic and atraumatic.  Eyes:     Pupils: Pupils are equal, round, and reactive to light.  Neck:     Musculoskeletal: Normal range of motion.  Cardiovascular:     Rate and Rhythm: Normal rate and regular rhythm.     Heart sounds: Normal heart sounds.  Pulmonary:     Effort: Pulmonary effort is normal.     Breath sounds: Normal breath sounds.  Abdominal:     General: Bowel sounds are normal.     Palpations: Abdomen is soft.     Tenderness: There is no abdominal tenderness.     Comments: Colostomy in place.   Skin:    General: Skin is warm and dry.  Neurological:     Mental Status: She is alert and oriented to person, place, and time.      CMP Latest Ref Rng & Units 03/25/2019  Glucose 70 - 99 mg/dL 119(H)  BUN 8 - 23 mg/dL 14  Creatinine 0.44 - 1.00 mg/dL 0.67  Sodium 135 - 145 mmol/L 136  Potassium 3.5 - 5.1 mmol/L 3.3(L)  Chloride 98 - 111 mmol/L 103  CO2 22 - 32 mmol/L 22  Calcium 8.9 - 10.3 mg/dL 8.5(L)  Total Protein 6.5 - 8.1 g/dL -  Total Bilirubin 0.3 - 1.2 mg/dL -  Alkaline Phos 38 - 126 U/L -  AST 15 - 41 U/L -  ALT 0 - 44 U/L -   CBC Latest Ref Rng & Units 03/25/2019  WBC 4.0 - 10.5 K/uL 5.4  Hemoglobin 12.0 - 15.0 g/dL 10.3(L)  Hematocrit 36.0 - 46.0 % 31.1(L)  Platelets 150 - 400 K/uL 162    @IMAGES @  Ct Chest W Contrast  Result Date: 03/24/2019 CLINICAL DATA:  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 Diagnosis May 2020. Currently undergoing chemotherapy and radiation. EXAM: CT CHEST, ABDOMEN, AND PELVIS WITH CONTRAST TECHNIQUE: Multidetector CT imaging of the chest,  abdomen and pelvis was performed following the standard protocol during bolus administration of intravenous contrast. CONTRAST:  163mL OMNIPAQUE IOHEXOL 300 MG/ML  SOLN COMPARISON:  PET-CT 02/05/2019 FINDINGS: CT CHEST FINDINGS Cardiovascular: The heart is normal in size. No pericardial effusion. The aorta is normal in caliber. Moderate atherosclerotic calcifications. No dissection. The branch vessels are patent. Stable coronary artery calcifications. Mediastinum/Nodes: No mediastinal or hilar mass or lymphadenopathy. The esophagus is unremarkable. Lungs/Pleura: Stable underlying emphysematous changes. The right upper lobe pulmonary lesion measures 7.5 mm on image number 58 and previously measured 15.5 mm. Semi solid nodular left upper lobe lesion peripherally on image number 85 measures 18 mm and is unchanged. This was not hypermetabolic on the recent PET-CT but recommend continued observation/surveillance. A few tiny scattered subpleural nodules are stable and likely represent lymph nodes. No infiltrates or effusions. Musculoskeletal: No breast masses, supraclavicular or axillary adenopathy. The right-sided Port-A-Cath is stable. The thyroid gland is unremarkable. Stable small right nodule. No significant bony findings. CT ABDOMEN PELVIS FINDINGS Hepatobiliary: Stable low-attenuation lesion in segment 6 most consistent with a benign hepatic cyst. No worrisome hepatic lesions or intrahepatic biliary dilatation. The gallbladder is surgically absent. Mild associated common bile duct dilatation. Pancreas: No mass, inflammation or ductal dilatation. Spleen: Normal size.  No focal lesions. Adrenals/Urinary Tract: The adrenal glands are normal in stable. No renal, ureteral or bladder calculi or mass. Stomach/Bowel: The stomach, duodenum, small bowel and colon are unremarkable. No acute inflammatory changes, mass lesions or  obstructive findings. Patient has a loop ileostomy in the right lower quadrant. Prior left  hemicolectomy with transverse colon to rectal anastomosis. There is dense contrast in the rectum with moderate artifact but no findings suspicious for residual or recurrent tumor. Vascular/Lymphatic: Stable aortic and iliac artery calcifications but no aneurysm. The major venous structures are patent. No mesenteric or retroperitoneal mass or adenopathy. No pelvic lymphadenopathy. Reproductive: The uterus is surgically absent. Both ovaries are still present and appear normal. There is a simple appearing cyst associated with the left ovary. Other: No free pelvic fluid collections or pelvic abscess. No inguinal mass or adenopathy. No subcutaneous lesions. Musculoskeletal: No significant bony findings. IMPRESSION: 1. Interval decrease in size of the right upper lobe pulmonary nodule now measuring 7.5 mm and previously measuring 15.5 mm. 2. Stable 18 mm semi-solid airspace nodule in the left upper lobe. Recommend continued surveillance. 3. No mediastinal or hilar mass or adenopathy. 4. Stable surgical changes involving the colon. No findings suspicious for recurrent tumor, locoregional adenopathy or distant metastatic disease. Electronically Signed   By: Marijo Sanes M.D.   On: 03/24/2019 15:26   Mr Jeri Cos QB Contrast  Result Date: 02/26/2019 CLINICAL DATA:  History of lung and colon cancer.  Dizziness. EXAM: MRI HEAD WITHOUT AND WITH CONTRAST TECHNIQUE: Multiplanar, multiecho pulse sequences of the brain and surrounding structures were obtained without and with intravenous contrast. CONTRAST:  6 cc Gadavist COMPARISON:  01/31/2017 FINDINGS: Brain: Numerous old small infarctions in the inferior cerebellum on the left. No brainstem abnormality is seen. Cerebral hemispheres elsewhere show moderate chronic small-vessel change of the deep and subcortical white matter. No cortical or large vessel territory infarction. No mass lesion, hemorrhage, hydrocephalus or extra-axial collection. No abnormal contrast enhancement  occurs. Vascular: Major vessels at the base of the brain show flow. Skull and upper cervical spine: Negative Sinuses/Orbits: Clear/normal Other: None IMPRESSION: No acute or reversible finding. Chronic small-vessel ischemic changes of the cerebral hemispheric white matter. Numerous old small vessel infarctions at the inferior cerebellum on the left. No evidence of metastatic disease. Electronically Signed   By: Nelson Chimes M.D.   On: 02/26/2019 15:15   Ct Abdomen Pelvis W Contrast  Result Date: 03/24/2019 CLINICAL DATA:  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 Diagnosis May 2020. Currently undergoing chemotherapy and radiation. EXAM: CT CHEST, ABDOMEN, AND PELVIS WITH CONTRAST TECHNIQUE: Multidetector CT imaging of the chest, abdomen and pelvis was performed following the standard protocol during bolus administration of intravenous contrast. CONTRAST:  144mL OMNIPAQUE IOHEXOL 300 MG/ML  SOLN COMPARISON:  PET-CT 02/05/2019 FINDINGS: CT CHEST FINDINGS Cardiovascular: The heart is normal in size. No pericardial effusion. The aorta is normal in caliber. Moderate atherosclerotic calcifications. No dissection. The branch vessels are patent. Stable coronary artery calcifications. Mediastinum/Nodes: No mediastinal or hilar mass or lymphadenopathy. The esophagus is unremarkable. Lungs/Pleura: Stable underlying emphysematous changes. The right upper lobe pulmonary lesion measures 7.5 mm on image number 58 and previously measured 15.5 mm. Semi solid nodular left upper lobe lesion peripherally on image number 85 measures 18 mm and is unchanged. This was not hypermetabolic on the recent PET-CT but recommend continued observation/surveillance. A few tiny scattered subpleural nodules are stable and likely represent lymph nodes. No infiltrates or effusions. Musculoskeletal: No breast masses, supraclavicular or axillary adenopathy. The right-sided Port-A-Cath is stable. The thyroid gland is unremarkable.  Stable small right nodule. No significant bony findings. CT ABDOMEN PELVIS FINDINGS Hepatobiliary: Stable low-attenuation lesion in segment 6 most consistent  with a benign hepatic cyst. No worrisome hepatic lesions or intrahepatic biliary dilatation. The gallbladder is surgically absent. Mild associated common bile duct dilatation. Pancreas: No mass, inflammation or ductal dilatation. Spleen: Normal size.  No focal lesions. Adrenals/Urinary Tract: The adrenal glands are normal in stable. No renal, ureteral or bladder calculi or mass. Stomach/Bowel: The stomach, duodenum, small bowel and colon are unremarkable. No acute inflammatory changes, mass lesions or obstructive findings. Patient has a loop ileostomy in the right lower quadrant. Prior left hemicolectomy with transverse colon to rectal anastomosis. There is dense contrast in the rectum with moderate artifact but no findings suspicious for residual or recurrent tumor. Vascular/Lymphatic: Stable aortic and iliac artery calcifications but no aneurysm. The major venous structures are patent. No mesenteric or retroperitoneal mass or adenopathy. No pelvic lymphadenopathy. Reproductive: The uterus is surgically absent. Both ovaries are still present and appear normal. There is a simple appearing cyst associated with the left ovary. Other: No free pelvic fluid collections or pelvic abscess. No inguinal mass or adenopathy. No subcutaneous lesions. Musculoskeletal: No significant bony findings. IMPRESSION: 1. Interval decrease in size of the right upper lobe pulmonary nodule now measuring 7.5 mm and previously measuring 15.5 mm. 2. Stable 18 mm semi-solid airspace nodule in the left upper lobe. Recommend continued surveillance. 3. No mediastinal or hilar mass or adenopathy. 4. Stable surgical changes involving the colon. No findings suspicious for recurrent tumor, locoregional adenopathy or distant metastatic disease. Electronically Signed   By: Marijo Sanes M.D.   On:  03/24/2019 15:26   Dg Chest Port 1 View  Result Date: 03/12/2019 CLINICAL DATA:  Port-A-Cath placement. EXAM: PORTABLE CHEST 1 VIEW COMPARISON:  Chest x-ray 02/12/2019 FINDINGS: Right IJ power port tip is at the level of the carina in the mid SVC. No complicating features. The cardiac silhouette, mediastinal and hilar contours are stable. The lungs are clear. No pleural effusion. IMPRESSION: Right IJ power port in good position with the tip in the mid SVC. No complicating features. Electronically Signed   By: Marijo Sanes M.D.   On: 03/12/2019 14:26   Dg Be (colon)w Single Cm (sol Or Thin Ba)  Result Date: 02/25/2019 CLINICAL DATA:  Rectal pain, s/p sigmoid resection with reanastomosis and diverting ileostomy EXAM: BE LIMITED WITH CONTRAST CONTRAST:  Single-contrast barium FLUOROSCOPY TIME:  Fluoroscopy Time:  1:00 Number of Acquired Spot Images: 8 COMPARISON:  PET-CT, 02/05/2019 FINDINGS: Following rectal cannulation and precontrast spot images, barium contrast was administered through the rectal tube under gentle gravitational pressure to the limit of patient tolerance and catheter leakage. The rectum is contrast opacified to the sigmorectal anastomosis and just proximal to the visualized sutures, but does not pass further. No evidence of leak or fistula. IMPRESSION: The rectum is contrast opacified to the sigmorectal anastomosis and just proximal to the visualized sutures, but does not pass further. There may be stenosis or adhesion in the vicinity of the anastomosis. No evidence of leak or fistula. Electronically Signed   By: Eddie Candle M.D.   On: 02/25/2019 11:01   Dg C-arm 1-60 Min-no Report  Result Date: 03/12/2019 Fluoroscopy was utilized by the requesting physician.  No radiographic interpretation.     Assessment and plan- Patient is a 73 y.o. female with history of stage III colon cancer status post resection (did not receive adjuvant chemotherapy).  Then had new diagnosis of limited  stage small cell lung cancer status post cycle 1 of carboplatin and etoposide chemotherapy on 03/17/2019 with concurrent radiation admitted  for severe abdominal pain along with nausea possibly secondary to UTI  1.  CT abdomen did not reveal any acute pathology.  She was admitted from the cancer center due to severe abdominal pain as well as severe hypokalemia.  Hypokalemia has now improved after potassium replacement.  She is currently being treated with empiric antibiotics for possible UTI.  Continue current management and if patient is symptomatically better she could be discharged in the next 24 to 48 hours and I will follow-up with her as an outpatient   Visit Diagnosis 1. Lower urinary tract infectious disease      Dr. Randa Evens, MD, MPH [facility] [phone] 03/25/2019 10:53 PM

## 2019-03-26 ENCOUNTER — Ambulatory Visit
Admission: RE | Admit: 2019-03-26 | Discharge: 2019-03-26 | Disposition: A | Discharge status: Home / Self Care | Payer: Medicare Other | Source: Ambulatory Visit | Attending: Radiation Oncology | Admitting: Radiation Oncology

## 2019-03-26 DIAGNOSIS — K921 Melena: Secondary | ICD-10-CM

## 2019-03-26 DIAGNOSIS — N390 Urinary tract infection, site not specified: Secondary | ICD-10-CM

## 2019-03-26 DIAGNOSIS — C189 Malignant neoplasm of colon, unspecified: Secondary | ICD-10-CM

## 2019-03-26 LAB — BASIC METABOLIC PANEL
Anion gap: 9 (ref 5–15)
BUN: 12 mg/dL (ref 8–23)
CO2: 22 mmol/L (ref 22–32)
Calcium: 8.4 mg/dL — ABNORMAL LOW (ref 8.9–10.3)
Chloride: 108 mmol/L (ref 98–111)
Creatinine, Ser: 0.54 mg/dL (ref 0.44–1.00)
GFR calc Af Amer: >60 mL/min (ref >60)
GFR calc non Af Amer: >60 mL/min (ref >60)
Glucose, Bld: 123 mg/dL — ABNORMAL HIGH (ref 70–99)
Potassium: 3.1 mmol/L — ABNORMAL LOW (ref 3.5–5.1)
Sodium: 139 mmol/L (ref 135–145)

## 2019-03-26 LAB — CBC
HCT: 29.9 % — ABNORMAL LOW (ref 36.0–46.0)
Hemoglobin: 9.6 g/dL — ABNORMAL LOW (ref 12.0–15.0)
MCH: 26.7 pg (ref 26.0–34.0)
MCHC: 32.1 g/dL (ref 30.0–36.0)
MCV: 83.1 fL (ref 80.0–100.0)
Platelets: 134 10*3/uL — ABNORMAL LOW (ref 150–400)
RBC: 3.6 MIL/uL — ABNORMAL LOW (ref 3.87–5.11)
RDW: 13.6 % (ref 11.5–15.5)
WBC: 4.2 10*3/uL (ref 4.0–10.5)
nRBC: 0 % (ref 0.0–0.2)

## 2019-03-26 LAB — GASTROINTESTINAL PANEL BY PCR, STOOL (REPLACES STOOL CULTURE)

## 2019-03-26 LAB — URINE CULTURE: Culture: <10000 — AB

## 2019-03-26 LAB — NOVEL CORONAVIRUS, NAA (HOSP ORDER, SEND-OUT TO REF LAB; TAT 18-24 HRS): SARS-CoV-2, NAA: NOT DETECTED

## 2019-03-26 LAB — HEMOGLOBIN: Hemoglobin: 9.1 g/dL — ABNORMAL LOW (ref 12.0–15.0)

## 2019-03-26 LAB — C DIFFICILE QUICK SCREEN W PCR REFLEX
C Diff antigen: NEGATIVE
C Diff interpretation: NOT DETECTED
C Diff toxin: NEGATIVE

## 2019-03-26 LAB — OCCULT BLOOD X 1 CARD TO LAB, STOOL: Fecal Occult Bld: POSITIVE — AB

## 2019-03-26 MED ORDER — NYSTATIN 100000 UNIT/ML MT SUSP
5.0000 mL | Freq: Four times a day (QID) | OROMUCOSAL | Status: DC
  Administered 2019-03-26 – 2019-03-27 (×4): 500000 [IU] via ORAL
  Filled 2019-03-26 (×4): qty 5

## 2019-03-26 MED ORDER — LOPERAMIDE HCL 2 MG PO CAPS
4.0000 mg | ORAL_CAPSULE | Freq: Two times a day (BID) | ORAL | Status: DC
  Administered 2019-03-26 – 2019-03-27 (×3): 4 mg via ORAL
  Filled 2019-03-26 (×3): qty 2

## 2019-03-26 MED ORDER — POTASSIUM CHLORIDE CRYS ER 20 MEQ PO TBCR
40.0000 meq | EXTENDED_RELEASE_TABLET | ORAL | Status: AC
  Administered 2019-03-26 (×2): 40 meq via ORAL
  Filled 2019-03-26 (×2): qty 2

## 2019-03-26 MED ORDER — CLONAZEPAM 0.5 MG PO TABS
0.5000 mg | ORAL_TABLET | Freq: Three times a day (TID) | ORAL | Status: DC
  Administered 2019-03-26 – 2019-03-27 (×2): 0.5 mg via ORAL
  Filled 2019-03-26 (×2): qty 1

## 2019-03-26 NOTE — Consult Note (Signed)
[place] Nurse ostomy consult note Stoma type/location: RLQ ileostomy. Had leaked overnight and RN placed two piece system on. Patient usually wears one piece.  She is satisfied that the current pouch is in place. Would like a tape barrier applied and I have done this. I have left a one piece convex as well as an ostomy belt at bedside. Patient normally wears this.  No further needs at this time.  Will not follow at this time.  Please re-consult if needed.  Domenic Moras MSN, RN, FNP-BC CWON Wound, Ostomy, Continence Nurse Pager [phone]

## 2019-03-26 NOTE — Consult Note (Signed)
Gloria Lame, MD [facility]  [address] [place], [place] [address] Phone: [phone] Fax : [phone]  Consultation  Referring Provider:     Dr. Leslye Peer  Primary Care Physician:  Inc, [facility] Primary Gastroenterologist:  Dr., Mauricia Area         Reason for Consultation:     Anemia and dark stools  Date of Admission:  03/24/2019 Date of Consultation:  03/26/2019         HPI:   Gloria Allen is a 73 y.o. female with a history of stage III colon cancer and a diagnosis of small cell lung cancer.  The patient was admitted with abdominal pain and nausea.  The patient is receiving IV chemotherapy and radiation.  The patient was having abdominal pain with dysuria and urinary frequency with the feeling of possible fevers.  This was reported at the cancer center therefore she was seen in the ED.  The patient was noted to have a white cell count of 11.8 and lactic acid of 3.9.  The patient was treated with IV fluids and admitted to the hospital.  The patient reports that she has been having dark stools in addition to her significant nausea.  The patient was started on antibiotics for a possible UTI.  The patient had a CT scan that showed:  IMPRESSION: 1. Interval decrease in size of the right upper lobe pulmonary nodule now measuring 7.5 mm and previously measuring 15.5 mm. 2. Stable 18 mm semi-solid airspace nodule in the left upper lobe. Recommend continued surveillance. 3. No mediastinal or hilar mass or adenopathy. 4. Stable surgical changes involving the colon. No findings suspicious for recurrent tumor, locoregional adenopathy or distant metastatic disease.  The patient had stool cards sent off that showed her to have blood in her stool.  The patient's C. difficile was negative.  Her hemoglobin has continued to decrease:  Component     Latest Ref Rng & Units 03/17/2019 03/24/2019 03/24/2019 03/25/2019         12:42 PM  7:53 PM   Hemoglobin     12.0 - 15.0 g/dL  13.1 12.6 12.8 10.3 (L)   Component     Latest Ref Rng & Units 03/26/2019 03/26/2019         4:17 AM 12:18 PM  Hemoglobin     12.0 - 15.0 g/dL 9.6 (L) 9.1 (L)    I am now being asked to see the patient for her anemia and dark stools.  Past Medical History:  Diagnosis Date  . Anxiety   . Arthritis   . Asthma   . Cancer ([place])   . Cervical central spinal stenosis (C3-C7) (worse at C4-5) 07/30/2017  . Cervical foraminal stenosis (C4-5 and C5-6) (Bilateral) (L>R) 07/30/2017  . Chronic lower extremity pain (Fourth Area of Pain) (Bilateral) (R>L) 06/11/2017  . Chronic neck pain (Primary Area of Pain) (Bilateral) (R>L) 06/11/2017  . Chronic sacroiliac joint pain (Bilateral) (R>L) 06/11/2017  . Chronic shoulder pain [facility] Area of Pain) (Right) 06/11/2017  . Chronic upper extremity pain (Fifth Area of Pain) (Bilateral) (R>L) 06/11/2017  . Colon cancer ([place])   . DDD (degenerative disc disease), cervical 07/30/2017  . DDD (degenerative disc disease), lumbar 07/30/2017  . Depression   . DISH (diffuse idiopathic skeletal hyperostosis) 07/30/2017  . Dyspnea    with exertion  . Entrapment syndrome 06/20/2017   2002 on R and 2010 on L  . Full thickness rotator cuff tear 06/12/2017  . GERD (  gastroesophageal reflux disease)   . Grade 1 Anterolisthesis of L4 over L5 07/30/2017  . Headache   . Hx: UTI (urinary tract infection)   . Hypertension   . Inflammation of joint of shoulder region 01/15/2017  . Lumbar central spinal stenosis (L4-5) 07/30/2017  . Lumbar disc protrusion (Left: L5-S1) (Right: L1-2) 07/30/2017   L5-S1 left foraminal protrusion with L5 impingement. L1-2 right paracentral protrusion without impingement.  . Lumbar facet arthropathy (Bilateral) 07/30/2017  . Lumbar facet syndrome (Bilateral) (R>L) 07/30/2017  . Lung cancer ([place])   . Osteoarthritis of shoulder (Right) 01/15/2017    Past Surgical History:  Procedure Laterality Date  . ABDOMINAL HYSTERECTOMY    . APPENDECTOMY     . BREAST REDUCTION SURGERY Bilateral   . COLONOSCOPY WITH PROPOFOL N/A 11/05/2018   Procedure: COLONOSCOPY WITH PROPOFOL;  Surgeon: Virgel Manifold, MD;  Location: [facility] ENDOSCOPY;  Service: Endoscopy;  Laterality: N/A;  . COLONOSCOPY WITH PROPOFOL N/A 11/06/2018   Procedure: COLONOSCOPY WITH PROPOFOL;  Surgeon: Virgel Manifold, MD;  Location: [facility] ENDOSCOPY;  Service: Endoscopy;  Laterality: N/A;  . COLOSTOMY    . DILATION AND CURETTAGE OF UTERUS    . ESOPHAGOGASTRODUODENOSCOPY (EGD) WITH PROPOFOL N/A 11/05/2018   Procedure: ESOPHAGOGASTRODUODENOSCOPY (EGD) WITH PROPOFOL;  Surgeon: Virgel Manifold, MD;  Location: [facility] ENDOSCOPY;  Service: Endoscopy;  Laterality: N/A;  . ILEOSTOMY Right 11/27/2018   Procedure: ILEOSTOMY;  Surgeon: Jules Husbands, MD;  Location: [facility] ORS;  Service: General;  Laterality: Right;  . LAPAROSCOPIC SIGMOID COLECTOMY N/A 11/27/2018   Procedure: LAPAROSCOPIC SIGMOID COLECTOMY;  Surgeon: Jules Husbands, MD;  Location: [facility] ORS;  Service: General;  Laterality: N/A;  . ORIF ANKLE FRACTURE Right 11/21/2018   Procedure: OPEN REDUCTION INTERNAL FIXATION (ORIF) ANKLE FRACTURE;  Surgeon: Corky Mull, MD;  Location: [facility] ORS;  Service: Orthopedics;  Laterality: Right;  . PORTACATH PLACEMENT N/A 03/12/2019   Procedure: INSERTION PORT-A-CATH;  Surgeon: Jules Husbands, MD;  Location: [facility] ORS;  Service: General;  Laterality: N/A;  . REDUCTION MAMMAPLASTY    . SHOULDER ARTHROSCOPY WITH OPEN ROTATOR CUFF REPAIR AND DISTAL CLAVICLE ACROMINECTOMY Right 03/26/2017   Procedure: right shoulder arthroscopy, arthroscopic subacromial decompression, distal clavicle excision, mini open rotator cuff repair;  Surgeon: Thornton Park, MD;  Location: [facility] ORS;  Service: Orthopedics;  Laterality: Right;    Prior to Admission medications   Medication Sig Start Date End Date Taking? Authorizing Provider  albuterol (PROVENTIL HFA;VENTOLIN HFA) 108 (90 Base) MCG/ACT inhaler Inhale 2 puffs  into the lungs every 6 (six) hours as needed for wheezing or shortness of breath. 04/02/18  Yes Kathrine Haddock, NP  amLODipine (NORVASC) 10 MG tablet Take 1 tablet by mouth daily.  01/19/19  Yes [provider]  BREO ELLIPTA 200-25 MCG/INH AEPB Inhale 1 puff into the lungs daily.  08/11/18  Yes [provider]  clonazePAM (KLONOPIN) 0.5 MG tablet Take 0.5 mg by mouth 3 (three) times daily.    Yes [provider]  cloNIDine (CATAPRES) 0.3 MG tablet Take 2 tablets (0.6 mg total) by mouth 2 (two) times daily. Patient taking differently: Take 0.3 mg by mouth 2 (two) times daily.  05/19/18  Yes Volney American, PA-C  hydrALAZINE (APRESOLINE) 100 MG tablet Take 1 tablet (100 mg total) by mouth 3 (three) times daily. 04/22/18  Yes Johnson, Megan P, DO  hydrOXYzine (ATARAX/VISTARIL) 10 MG tablet TAKE 1 TABLET BY MOUTH THREE TIMES A DAY AS NEEDED Patient taking differently: Take 10 mg by  mouth 3 (three) times daily as needed for itching.  06/22/18  Yes Johnson, Megan P, DO  lidocaine-prilocaine (EMLA) cream Apply to affected area once 02/27/19  Yes Sindy Guadeloupe, MD  loperamide (IMODIUM) 2 MG capsule Take 2 capsules (4 mg total) by mouth 3 (three) times daily. 03/12/19  Yes Pabon, Diego F, MD  LORazepam (ATIVAN) 0.5 MG tablet Take 1 tablet (0.5 mg total) by mouth every 8 (eight) hours as needed for anxiety. 02/24/19  Yes Sindy Guadeloupe, MD  lovastatin (MEVACOR) 40 MG tablet Take 1 tablet (40 mg total) by mouth at bedtime. 04/24/18  Yes Johnson, Megan P, DO  meclizine (ANTIVERT) 12.5 MG tablet Take 12.5 mg by mouth 3 (three) times daily as needed for dizziness.  08/20/18  Yes [provider]  megestrol (MEGACE) 400 MG/10ML suspension Take 10 mLs (400 mg total) by mouth daily for 30 doses. 03/12/19 04/11/19 Yes Pabon, [place], MD  methocarbamol (ROBAXIN) 500 MG tablet Take 1 tablet (500 mg total) by mouth at bedtime as needed for muscle spasms. 05/19/18  Yes Volney American,  PA-C  metoprolol succinate (TOPROL-XL) 100 MG 24 hr tablet TAKE 1 TABLET BY MOUTH DAILY 06/22/18  Yes Johnson, Megan P, DO  mirtazapine (REMERON) 15 MG tablet take 1 tablet by mouth at bedtime for APPETITIE Patient taking differently: Take 15 mg by mouth at bedtime. take 1 tablet by mouth at bedtime for APPETITIE 04/24/18  Yes Johnson, Megan P, DO  montelukast (SINGULAIR) 10 MG tablet Take 1 tablet (10 mg total) by mouth at bedtime as needed. 04/02/18  Yes Kathrine Haddock, NP  OLANZapine (ZYPREXA) 10 MG tablet Take 1 tablet (10 mg total) by mouth at bedtime. 03/17/19  Yes Sindy Guadeloupe, MD  omeprazole (PRILOSEC) 40 MG capsule Take 1 capsule (40 mg total) by mouth 2 (two) times daily. 04/24/18  Yes Johnson, Megan P, DO  ondansetron (ZOFRAN) 8 MG tablet Take 1 tablet (8 mg total) by mouth 2 (two) times daily as needed for refractory nausea / vomiting. Start on day 3 after carboplatin chemo. 02/27/19  Yes Sindy Guadeloupe, MD  oxybutynin (DITROPAN) 5 MG tablet TAKE 1 TABLET BY MOUTH TWICE DAILY 08/25/18  Yes Johnson, Megan P, DO  oxyCODONE-acetaminophen (PERCOCET/ROXICET) 5-325 MG tablet Take 1 tablet by mouth every 6 (six) hours as needed for severe pain. 02/16/19  Yes Pabon, [place], MD  Potassium 99 MG TABS Take 1 tablet by mouth daily as needed (cramps).    Yes [provider]  prochlorperazine (COMPAZINE) 10 MG tablet Take 1 tablet (10 mg total) by mouth every 6 (six) hours as needed (Nausea or vomiting). 02/27/19  Yes Sindy Guadeloupe, MD  sertraline (ZOLOFT) 100 MG tablet Take 2 tablets (200 mg total) by mouth at bedtime. 04/24/18  Yes Johnson, Megan P, DO  terazosin (HYTRIN) 5 MG capsule TAKE 1 CAPSULE BY MOUTH ONCE DAILY AT BEDTIME 06/22/18  Yes Johnson, Megan P, DO  traZODone (DESYREL) 50 MG tablet Take 0.5-1 tablets (25-50 mg total) by mouth at bedtime as needed for sleep. 04/24/18  Yes Johnson, Megan P, DO  triamcinolone cream (KENALOG) 0.1 % APPLY TOPICALLY TWICE DAILY AS NEEDED FOR  ITCHING Patient taking differently: Apply 1 application topically 2 (two) times daily as needed (itching).  06/22/18  Yes Johnson, Megan P, DO  ANORO ELLIPTA 62.5-25 MCG/INH AEPB INHALE 1 PUFF BY MOUTH DAILY Patient not taking: Reported on 03/17/2019 08/25/18   Park Liter P, DO  HYDROcodone-acetaminophen (NORCO/VICODIN) [phone]  MG tablet Take 1 tablet by mouth every 6 (six) hours as needed for moderate pain. Patient not taking: Reported on 03/17/2019 03/12/19   Jules Husbands, MD    Family History  Problem Relation Age of Onset  . Hypertension Mother   . Breast cancer Mother 6  . Breast cancer Sister 63     Social History   Tobacco Use  . Smoking status: Former Smoker    Packs/day: 1.00    Types: Cigarettes    Quit date: 2010    Years since quitting: 10.4  . Smokeless tobacco: Never Used  . Tobacco comment: quit over 30 years ago   Substance Use Topics  . Alcohol use: No  . Drug use: Yes    Types: Marijuana    Allergies as of 03/24/2019 - Review Complete 03/24/2019  Allergen Reaction Noted  . Ace inhibitors Swelling 06/20/2017  . Angiotensin receptor blockers Swelling 06/20/2017  . Sulfa antibiotics Itching 08/20/2018  . Chlorthalidone Rash 04/22/2018  . Flexeril [cyclobenzaprine] Rash 04/22/2018    Review of Systems:    All systems reviewed and negative except where noted in HPI.   Physical Exam:  Vital signs in last 24 hours: Temp:  [97.2 F (36.2 C)-100.4 F (38 C)] 97.2 F (36.2 C) (06/25 0500) Pulse Rate:  [61-74] 65 (06/25 0751) Resp:  [15] 15 (06/25 0500) BP: (118-139)/(51-70) 130/59 (06/25 0751) SpO2:  [99 %] 99 % (06/25 0500) Last BM Date: 03/26/19 General:   Pleasant, cooperative in NAD Head:  Normocephalic and atraumatic. Eyes:   No icterus.   Conjunctiva pink. PERRLA. Ears:  Normal auditory acuity. Neck:  Supple; no masses or thyroidomegaly Lungs: Respirations even and unlabored. Lungs clear to auscultation bilaterally.   No wheezes, crackles, or  rhonchi.  Heart:  Regular rate and rhythm;  Without murmur, clicks, rubs or gallops Abdomen:  Soft, nondistended, nontender. Normal bowel sounds. No appreciable masses or hepatomegaly.  No rebound or guarding.  Rectal:  Not performed. Msk:  Symmetrical without gross deformities.    Extremities:  Without edema, cyanosis or clubbing. Neurologic:  Alert and oriented x3;  grossly normal neurologically. Skin:  Intact without significant lesions or rashes. Cervical Nodes:  No significant cervical adenopathy. Psych:  Alert and cooperative. Normal affect.  LAB RESULTS: Recent Labs    03/24/19 1953 03/25/19 0546 03/26/19 0417 03/26/19 1218  WBC 11.8* 5.4 4.2  --   HGB 12.8 10.3* 9.6* 9.1*  HCT 38.0 31.1* 29.9*  --   PLT 250 162 134*  --    BMET Recent Labs    03/24/19 1953 03/25/19 0546 03/26/19 0417  NA 132* 136 139  K 2.9* 3.3* 3.1*  CL 95* 103 108  CO2 22 22 22   GLUCOSE 168* 119* 123*  BUN 17 14 12   CREATININE 0.81 0.67 0.54  CALCIUM 9.3 8.5* 8.4*   LFT Recent Labs    03/24/19 1953  PROT 8.3*  ALBUMIN 4.6  AST 27  ALT 31  ALKPHOS 142*  BILITOT 0.8   PT/INR No results for input(s): LABPROT, INR in the last 72 hours.  STUDIES: No results found.    Impression / Plan:   Assessment: Active Problems:   Sepsis ([place])   Gloria Allen is a 73 y.o. y/o female with anemia and dark stools.  The patient's hemoglobin on 16 June was 12.6 and this morning was 9.1.  The patient denies taking NSAIDs although she does use BC powder she has not used them in a few  weeks.  Plan:  The patient has been told that due to her blood in her stool in addition to her stools being black and her nausea she will be set up for a upper endoscopy for tomorrow. I have discussed risks & benefits which include, but are not limited to, bleeding, infection, perforation & drug reaction.  The patient agrees with this plan & written consent will be obtained.     Thank you for involving me in the  care of this patient.      LOS: 1 day   Gloria Lame, MD  03/26/2019, 4:25 PM    Note: This dictation was prepared with Dragon dictation along with smaller phrase technology. Any transcriptional errors that result from this process are unintentional.

## 2019-03-26 NOTE — Progress Notes (Signed)
Changed patient colostomy bag size 42mm, secured edges with tape and barrier ointment. Morphine 2mg  given. Will continue to monitor.  Stacie Glaze, RN

## 2019-03-26 NOTE — Progress Notes (Signed)
Patient ID: Gloria Allen, female   DOB: 03/06/1946, 73 y.o.   MRN: [id number]  [employer] PROGRESS NOTE  Gloria Allen:[id number] DOB: 1946-07-19 DOA: 03/24/2019 PCP: [employer]  HPI/Subjective: Patient was feeling well then she ate breakfast and started developing some abdominal pain.  She has been having her diarrhea.  She normally takes Imodium at home.  Objective: Vitals:   03/26/19 0500 03/26/19 0751  BP: (!) 118/51 (!) 130/59  Pulse: 64 65  Resp: 15   Temp: (!) 97.2 F (36.2 C)   SpO2: 99%     Filed Weights   03/24/19 1619  Weight: 66.7 kg    ROS: Review of Systems  Constitutional: Negative for chills and fever.  Eyes: Negative for blurred vision.  Respiratory: Negative for cough and shortness of breath.   Cardiovascular: Negative for chest pain.  Gastrointestinal: Positive for abdominal pain and diarrhea. Negative for constipation, nausea and vomiting.  Genitourinary: Negative for dysuria.  Musculoskeletal: Negative for joint pain.  Neurological: Negative for dizziness and headaches.   Exam: Physical Exam  Constitutional: She is oriented to person, place, and time.  HENT:  Nose: No mucosal edema.  Mouth/Throat: No oropharyngeal exudate or posterior oropharyngeal edema.  Eyes: Pupils are equal, round, and reactive to light. Conjunctivae, EOM and lids are normal.  Neck: No JVD present. Carotid bruit is not present. No edema present. No thyroid mass and no thyromegaly present.  Cardiovascular: S1 normal and S2 normal. Exam reveals no gallop.  No murmur heard. Pulses:      Dorsalis pedis pulses are 2+ on the right side and 2+ on the left side.  Respiratory: No respiratory distress. She has no wheezes. She has no rhonchi. She has no rales.  GI: Soft. Bowel sounds are normal. There is abdominal tenderness.  Black stool in ostomy  Musculoskeletal:     Right ankle: She exhibits no swelling.     Left ankle: She exhibits no swelling.   Lymphadenopathy:    She has no cervical adenopathy.  Neurological: She is alert and oriented to person, place, and time. No cranial nerve deficit.  Skin: Skin is warm. No rash noted. Nails show no clubbing.  Psychiatric: She has a normal mood and affect.      Data Reviewed: Basic Metabolic Panel: Recent Labs  Lab 03/24/19 1242 03/24/19 1953 03/25/19 0546 03/26/19 0417  NA 132* 132* 136 139  K 2.7* 2.9* 3.3* 3.1*  CL 99 95* 103 108  CO2 21* 22 22 22   GLUCOSE 204* 168* 119* 123*  BUN 19 17 14 12   CREATININE 0.89 0.81 0.67 0.54  CALCIUM 9.0 9.3 8.5* 8.4*   Liver Function Tests: Recent Labs  Lab 03/24/19 1242 03/24/19 1953  AST 29 27  ALT 29 31  ALKPHOS 126 142*  BILITOT 0.6 0.8  PROT 7.7 8.3*  ALBUMIN 4.1 4.6   Recent Labs  Lab 03/24/19 1953  LIPASE 23   CBC: Recent Labs  Lab 03/24/19 1242 03/24/19 1953 03/25/19 0546 03/26/19 0417 03/26/19 1218  WBC 13.7* 11.8* 5.4 4.2  --   NEUTROABS 11.7* 9.6*  --   --   --   HGB 12.6 12.8 10.3* 9.6* 9.1*  HCT 38.4 38.0 31.1* 29.9*  --   MCV 82.4 80.9 82.1 83.1  --   PLT 221 250 162 134*  --      Recent Results (from the past 240 hour(s))  Urine culture     Status: Abnormal  Collection Time: 03/24/19  4:04 PM   Specimen: Urine, Clean Catch  Result Value Ref Range Status   Specimen Description   Final    URINE, CLEAN CATCH Performed at [facility], [address], [place], [place] [address]    Special Requests   Final    NONE Performed at [facility], [address] [place], [place] [address]    Culture MULTIPLE SPECIES PRESENT, SUGGEST RECOLLECTION (A)  Final   Report Status 03/25/2019 FINAL  Final  Urine Culture     Status: Abnormal   Collection Time: 03/24/19 11:15 PM   Specimen: Urine, Clean Catch  Result Value Ref Range Status   Specimen Description   Final    URINE, CLEAN CATCH Performed at [facility], [address], [place], [place] [address]    Special Requests    Final    Immunocompromised Performed at [facility], [address] [place], [place] [address]    Culture (A)  Final    <10,000 COLONIES/mL INSIGNIFICANT GROWTH Performed at [facility], [place] [address] [place], [place] [address]    Report Status 03/26/2019 FINAL  Final  Gastrointestinal Panel by PCR , Stool     Status: None   Collection Time: 03/26/19 10:41 AM   Specimen: STOOL  Result Value Ref Range Status   Campylobacter species NOT DETECTED NOT DETECTED Final   Plesimonas shigelloides NOT DETECTED NOT DETECTED Final   Salmonella species NOT DETECTED NOT DETECTED Final   Yersinia enterocolitica NOT DETECTED NOT DETECTED Final   Vibrio species NOT DETECTED NOT DETECTED Final   Vibrio cholerae NOT DETECTED NOT DETECTED Final   Enteroaggregative E coli (EAEC) NOT DETECTED NOT DETECTED Final   Enteropathogenic E coli (EPEC) NOT DETECTED NOT DETECTED Final   Enterotoxigenic E coli (ETEC) NOT DETECTED NOT DETECTED Final   Shiga like toxin producing E coli (STEC) NOT DETECTED NOT DETECTED Final   E. coli O157 NOT DETECTED NOT DETECTED Final   Shigella/Enteroinvasive E coli (EIEC) NOT DETECTED NOT DETECTED Final   Cryptosporidium NOT DETECTED NOT DETECTED Final   Cyclospora cayetanensis NOT DETECTED NOT DETECTED Final   Entamoeba histolytica NOT DETECTED NOT DETECTED Final   Giardia lamblia NOT DETECTED NOT DETECTED Final   Adenovirus F40/41 NOT DETECTED NOT DETECTED Final   Astrovirus NOT DETECTED NOT DETECTED Final   Norovirus GI/GII NOT DETECTED NOT DETECTED Final   Rotavirus A NOT DETECTED NOT DETECTED Final   Sapovirus (I, II, IV, and V) NOT DETECTED NOT DETECTED Final    Comment: Performed at [facility], [address] [place], [place] [address]  C difficile quick scan w PCR reflex     Status: None   Collection Time: 03/26/19 10:41 AM   Specimen: STOOL  Result Value Ref Range Status   C Diff antigen NEGATIVE NEGATIVE Final   C Diff toxin  NEGATIVE NEGATIVE Final   C Diff interpretation No C. difficile detected.  Final    Comment: Performed at [facility], [address] [place],  [address]     Studies: Ct Chest W Contrast  Result Date: 03/24/2019 CLINICAL DATA:  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 Diagnosis May 2020. Currently undergoing chemotherapy and radiation. EXAM: CT CHEST, ABDOMEN, AND PELVIS WITH CONTRAST TECHNIQUE: Multidetector CT imaging of the chest, abdomen and pelvis was performed following the standard protocol during bolus administration of intravenous contrast. CONTRAST:  114mL OMNIPAQUE IOHEXOL 300 MG/ML  SOLN  COMPARISON:  PET-CT 02/05/2019 FINDINGS: CT CHEST FINDINGS Cardiovascular: The heart is normal in size. No pericardial effusion. The aorta is normal in caliber. Moderate atherosclerotic calcifications. No dissection. The branch vessels are patent. Stable coronary artery calcifications. Mediastinum/Nodes: No mediastinal or hilar mass or lymphadenopathy. The esophagus is unremarkable. Lungs/Pleura: Stable underlying emphysematous changes. The right upper lobe pulmonary lesion measures 7.5 mm on image number 58 and previously measured 15.5 mm. Semi solid nodular left upper lobe lesion peripherally on image number 85 measures 18 mm and is unchanged. This was not hypermetabolic on the recent PET-CT but recommend continued observation/surveillance. A few tiny scattered subpleural nodules are stable and likely represent lymph nodes. No infiltrates or effusions. Musculoskeletal: No breast masses, supraclavicular or axillary adenopathy. The right-sided Port-A-Cath is stable. The thyroid gland is unremarkable. Stable small right nodule. No significant bony findings. CT ABDOMEN PELVIS FINDINGS Hepatobiliary: Stable low-attenuation lesion in segment 6 most consistent with a benign hepatic cyst. No worrisome hepatic lesions or intrahepatic biliary dilatation. The gallbladder is surgically  absent. Mild associated common bile duct dilatation. Pancreas: No mass, inflammation or ductal dilatation. Spleen: Normal size.  No focal lesions. Adrenals/Urinary Tract: The adrenal glands are normal in stable. No renal, ureteral or bladder calculi or mass. Stomach/Bowel: The stomach, duodenum, small bowel and colon are unremarkable. No acute inflammatory changes, mass lesions or obstructive findings. Patient has a loop ileostomy in the right lower quadrant. Prior left hemicolectomy with transverse colon to rectal anastomosis. There is dense contrast in the rectum with moderate artifact but no findings suspicious for residual or recurrent tumor. Vascular/Lymphatic: Stable aortic and iliac artery calcifications but no aneurysm. The major venous structures are patent. No mesenteric or retroperitoneal mass or adenopathy. No pelvic lymphadenopathy. Reproductive: The uterus is surgically absent. Both ovaries are still present and appear normal. There is a simple appearing cyst associated with the left ovary. Other: No free pelvic fluid collections or pelvic abscess. No inguinal mass or adenopathy. No subcutaneous lesions. Musculoskeletal: No significant bony findings. IMPRESSION: 1. Interval decrease in size of the right upper lobe pulmonary nodule now measuring 7.5 mm and previously measuring 15.5 mm. 2. Stable 18 mm semi-solid airspace nodule in the left upper lobe. Recommend continued surveillance. 3. No mediastinal or hilar mass or adenopathy. 4. Stable surgical changes involving the colon. No findings suspicious for recurrent tumor, locoregional adenopathy or distant metastatic disease. Electronically Signed   By: Marijo Sanes M.D.   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 Diagnosis May 2020. Currently undergoing chemotherapy and radiation. EXAM: CT CHEST, ABDOMEN, AND PELVIS WITH CONTRAST  TECHNIQUE: Multidetector CT imaging of the chest, abdomen and pelvis was performed following the standard protocol during bolus administration of intravenous contrast. CONTRAST:  127mL OMNIPAQUE IOHEXOL 300 MG/ML  SOLN COMPARISON:  PET-CT 02/05/2019 FINDINGS: CT CHEST FINDINGS Cardiovascular: The heart is normal in size. No pericardial effusion. The aorta is normal in caliber. Moderate atherosclerotic calcifications. No dissection. The branch vessels are patent. Stable coronary artery calcifications. Mediastinum/Nodes: No mediastinal or hilar mass or lymphadenopathy. The esophagus is unremarkable. Lungs/Pleura: Stable underlying emphysematous changes. The right upper lobe pulmonary lesion measures 7.5 mm on image number 58 and previously measured 15.5 mm. Semi solid nodular left upper lobe lesion peripherally on image number 85 measures 18 mm and is unchanged. This was not hypermetabolic on the recent PET-CT but recommend continued observation/surveillance. A few  tiny scattered subpleural nodules are stable and likely represent lymph nodes. No infiltrates or effusions. Musculoskeletal: No breast masses, supraclavicular or axillary adenopathy. The right-sided Port-A-Cath is stable. The thyroid gland is unremarkable. Stable small right nodule. No significant bony findings. CT ABDOMEN PELVIS FINDINGS Hepatobiliary: Stable low-attenuation lesion in segment 6 most consistent with a benign hepatic cyst. No worrisome hepatic lesions or intrahepatic biliary dilatation. The gallbladder is surgically absent. Mild associated common bile duct dilatation. Pancreas: No mass, inflammation or ductal dilatation. Spleen: Normal size.  No focal lesions. Adrenals/Urinary Tract: The adrenal glands are normal in stable. No renal, ureteral or bladder calculi or mass. Stomach/Bowel: The stomach, duodenum, small bowel and colon are unremarkable. No acute inflammatory changes, mass lesions or obstructive findings. Patient has a loop  ileostomy in the right lower quadrant. Prior left hemicolectomy with transverse colon to rectal anastomosis. There is dense contrast in the rectum with moderate artifact but no findings suspicious for residual or recurrent tumor. Vascular/Lymphatic: Stable aortic and iliac artery calcifications but no aneurysm. The major venous structures are patent. No mesenteric or retroperitoneal mass or adenopathy. No pelvic lymphadenopathy. Reproductive: The uterus is surgically absent. Both ovaries are still present and appear normal. There is a simple appearing cyst associated with the left ovary. Other: No free pelvic fluid collections or pelvic abscess. No inguinal mass or adenopathy. No subcutaneous lesions. Musculoskeletal: No significant bony findings. IMPRESSION: 1. Interval decrease in size of the right upper lobe pulmonary nodule now measuring 7.5 mm and previously measuring 15.5 mm. 2. Stable 18 mm semi-solid airspace nodule in the left upper lobe. Recommend continued surveillance. 3. No mediastinal or hilar mass or adenopathy. 4. Stable surgical changes involving the colon. No findings suspicious for recurrent tumor, locoregional adenopathy or distant metastatic disease. Electronically Signed   By: Marijo Sanes M.D.   On: 03/24/2019 15:26    Scheduled Meds: . amLODipine  10 mg Oral Daily  . cloNIDine  0.3 mg Oral BID  . hydrALAZINE  100 mg Oral TID  . loperamide  4 mg Oral BID  . megestrol  400 mg Oral Daily  . metoprolol succinate  100 mg Oral Daily  . mirtazapine  15 mg Oral QHS  . nystatin  5 mL Oral QID  . OLANZapine  10 mg Oral QHS  . oxybutynin  5 mg Oral BID  . pantoprazole  40 mg Oral Daily  . pravastatin  40 mg Oral q1800  . sertraline  200 mg Oral QHS  . sodium chloride flush  3 mL Intravenous Q12H  . sodium chloride flush  3 mL Intravenous Q12H  . terazosin  1 mg Oral QHS   Continuous Infusions: . sodium chloride    . cefTRIAXone (ROCEPHIN)  IV Stopped (03/25/19 2350)     Assessment/Plan:  1. Clinical sepsis likely secondary to urinary tract infection.  The patient had dohle bodies on initial CBC which usually goes along with bacterial infection.  Unfortunately the first urine culture was a contamination.  Second urine culture was negative probably because antibiotics were already given.  Continue Rocephin for now. 2. Abdominal pain, black diarrhea in ostomy bag, drop in hemoglobin.  Consult gastroenterology for possible endoscopy for tomorrow.  Check another hemoglobin tomorrow.  Hemoglobin dropped from 12.6 down to 9.1. 3. Small cell lung cancer undergoing radiation therapy and receiving IV chemotherapy. 4. History of colon cancer status post resection and ostomy bag.  Patient has had chronic diarrhea since.  Restart Imodium. 5. Hypertension on Norvasc hydralazine  and clonidine 6. Hyperlipidemia unspecified on pravastatin  Code Status:     Code Status Orders  (From admission, onward)         Start     Ordered   03/25/19 0220  Full code  Continuous     03/25/19 0221        Code Status History    Date Active Date Inactive Code Status Order ID Comments User Context   11/28/2018 1137 12/03/2018 1801 DNR [id number]  Demetrios Loll, MD Inpatient   11/27/2018 2029 11/28/2018 1137 Full Code [id number]  Jules Husbands, MD Inpatient   11/21/2018 1945 11/22/2018 0017 Full Code [id number]  Corky Mull, MD ED   03/26/2017 1223 03/27/2017 1740 Full Code [id number]  Thornton Park, MD Inpatient   Advance Care Planning Activity     Family Communication: Spoke with daughter on the phone Disposition Plan: Reassess on a daily basis on when to go home  Consultants:  Oncology  Gastroenterology  Antibiotics:  Rocephin  Time spent: 50 minutes  [place]

## 2019-03-27 ENCOUNTER — Inpatient Hospital Stay: Payer: Medicare Other | Admitting: Anesthesiology

## 2019-03-27 ENCOUNTER — Encounter
Admission: EM | Disposition: A | Discharge status: Home / Self Care | Payer: Self-pay | Source: Home / Self Care | Attending: Internal Medicine

## 2019-03-27 ENCOUNTER — Encounter: Payer: Self-pay | Admitting: Gastroenterology

## 2019-03-27 ENCOUNTER — Inpatient Hospital Stay: Payer: Medicare Other

## 2019-03-27 ENCOUNTER — Ambulatory Visit
Admission: RE | Admit: 2019-03-27 | Discharge: 2019-03-27 | Disposition: A | Discharge status: Home / Self Care | Payer: Medicare Other | Source: Ambulatory Visit | Attending: Radiation Oncology | Admitting: Radiation Oncology

## 2019-03-27 ENCOUNTER — Inpatient Hospital Stay: Payer: Medicare Other | Admitting: Oncology

## 2019-03-27 HISTORY — PX: ESOPHAGOGASTRODUODENOSCOPY (EGD) WITH PROPOFOL: SHX5813

## 2019-03-27 LAB — CBC
HCT: 28.1 % — ABNORMAL LOW (ref 36.0–46.0)
Hemoglobin: 9.2 g/dL — ABNORMAL LOW (ref 12.0–15.0)
MCH: 26.9 pg (ref 26.0–34.0)
MCHC: 32.7 g/dL (ref 30.0–36.0)
MCV: 82.2 fL (ref 80.0–100.0)
Platelets: 126 10*3/uL — ABNORMAL LOW (ref 150–400)
RBC: 3.42 MIL/uL — ABNORMAL LOW (ref 3.87–5.11)
RDW: 13.8 % (ref 11.5–15.5)
WBC: 4.7 10*3/uL (ref 4.0–10.5)
nRBC: 0 % (ref 0.0–0.2)

## 2019-03-27 SURGERY — ESOPHAGOGASTRODUODENOSCOPY (EGD) WITH PROPOFOL
Anesthesia: General

## 2019-03-27 MED ORDER — TRAZODONE HCL 50 MG PO TABS: 25.0000 mg | ORAL_TABLET | Freq: Every evening | ORAL | 0 refills | Status: DC | PRN

## 2019-03-27 MED ORDER — METHYLPREDNISOLONE SODIUM SUCC 40 MG IJ SOLR
20.0000 mg | Freq: Once | INTRAMUSCULAR | Status: AC
  Administered 2019-03-27: 11:00:00 20 mg via INTRAVENOUS
  Filled 2019-03-27: qty 1

## 2019-03-27 MED ORDER — SODIUM CHLORIDE 0.9 % IV SOLN
INTRAVENOUS | Status: DC
  Administered 2019-03-27: 1000 mL via INTRAVENOUS

## 2019-03-27 MED ORDER — AMLODIPINE BESYLATE 10 MG PO TABS: 10.0000 mg | ORAL_TABLET | Freq: Every day | ORAL | 0 refills | Status: DC

## 2019-03-27 MED ORDER — CEPHALEXIN 500 MG PO CAPS: 500.0000 mg | ORAL_CAPSULE | Freq: Three times a day (TID) | ORAL | 0 refills | Status: AC

## 2019-03-27 MED ORDER — CLONIDINE HCL 0.2 MG PO TABS: 0.2000 mg | ORAL_TABLET | Freq: Two times a day (BID) | ORAL | 0 refills | Status: DC

## 2019-03-27 MED ORDER — OMEPRAZOLE 40 MG PO CPDR: 40.0000 mg | DELAYED_RELEASE_CAPSULE | Freq: Two times a day (BID) | ORAL | 0 refills | Status: DC

## 2019-03-27 MED ORDER — CLONAZEPAM 0.5 MG PO TABS: 0.5000 mg | ORAL_TABLET | Freq: Three times a day (TID) | ORAL | 0 refills | Status: DC

## 2019-03-27 MED ORDER — METOPROLOL SUCCINATE ER 100 MG PO TB24: 100.0000 mg | ORAL_TABLET | Freq: Every day | ORAL | 0 refills | Status: DC

## 2019-03-27 MED ORDER — MIRTAZAPINE 15 MG PO TABS: ORAL_TABLET | ORAL | 0 refills | Status: DC

## 2019-03-27 MED ORDER — CLONIDINE HCL 0.3 MG PO TABS: 0.3000 mg | ORAL_TABLET | Freq: Two times a day (BID) | ORAL | 0 refills | Status: DC

## 2019-03-27 MED ORDER — SERTRALINE HCL 100 MG PO TABS: 200.0000 mg | ORAL_TABLET | Freq: Every day | ORAL | 0 refills | Status: DC

## 2019-03-27 MED ORDER — PROPOFOL 10 MG/ML IV BOLUS
INTRAVENOUS | Status: DC | PRN
  Administered 2019-03-27: 20 mg via INTRAVENOUS
  Administered 2019-03-27: 30 mg via INTRAVENOUS

## 2019-03-27 MED ORDER — NYSTATIN 100000 UNIT/ML MT SUSP: 5.0000 mL | Freq: Four times a day (QID) | OROMUCOSAL | 0 refills | Status: DC

## 2019-03-27 MED ORDER — BREO ELLIPTA 200-25 MCG/INH IN AEPB: 1.0000 | INHALATION_SPRAY | Freq: Every day | RESPIRATORY_TRACT | 0 refills | Status: DC

## 2019-03-27 MED ORDER — ONDANSETRON HCL 8 MG PO TABS: 8.0000 mg | ORAL_TABLET | Freq: Two times a day (BID) | ORAL | 0 refills | Status: DC | PRN

## 2019-03-27 MED ORDER — OXYCODONE HCL 5 MG PO TABS
5.0000 mg | ORAL_TABLET | ORAL | Status: DC | PRN
  Administered 2019-03-27: 5 mg via ORAL
  Filled 2019-03-27: qty 1

## 2019-03-27 MED ORDER — OLANZAPINE 10 MG PO TABS: 10.0000 mg | ORAL_TABLET | Freq: Every day | ORAL | 0 refills | Status: DC

## 2019-03-27 MED ORDER — MONTELUKAST SODIUM 10 MG PO TABS: 10.0000 mg | ORAL_TABLET | Freq: Every evening | ORAL | 0 refills | Status: AC | PRN

## 2019-03-27 MED ORDER — OXYCODONE-ACETAMINOPHEN 5-325 MG PO TABS: 1.0000 | ORAL_TABLET | Freq: Four times a day (QID) | ORAL | 0 refills | Status: DC | PRN

## 2019-03-27 MED ORDER — LIDOCAINE HCL (PF) 2 % IJ SOLN
INTRAMUSCULAR | Status: DC | PRN
  Administered 2019-03-27: 60 mg

## 2019-03-27 MED ORDER — PROPOFOL 500 MG/50ML IV EMUL
INTRAVENOUS | Status: DC | PRN
  Administered 2019-03-27: 50 ug/kg/min via INTRAVENOUS

## 2019-03-27 MED ORDER — HYDRALAZINE HCL 100 MG PO TABS: 100.0000 mg | ORAL_TABLET | Freq: Three times a day (TID) | ORAL | 0 refills | Status: AC

## 2019-03-27 MED ORDER — PROCHLORPERAZINE MALEATE 10 MG PO TABS: 10.0000 mg | ORAL_TABLET | Freq: Four times a day (QID) | ORAL | 0 refills | Status: DC | PRN

## 2019-03-27 MED ORDER — TERAZOSIN HCL 1 MG PO CAPS: 1.0000 mg | ORAL_CAPSULE | Freq: Every day | ORAL | 0 refills | Status: DC

## 2019-03-27 MED ORDER — METHOCARBAMOL 500 MG PO TABS: 500.0000 mg | ORAL_TABLET | Freq: Every evening | ORAL | 0 refills | Status: DC | PRN

## 2019-03-27 MED ORDER — LOPERAMIDE HCL 2 MG PO CAPS: 4.0000 mg | ORAL_CAPSULE | Freq: Three times a day (TID) | ORAL | 0 refills | Status: DC

## 2019-03-27 MED ORDER — PROPOFOL 500 MG/50ML IV EMUL
INTRAVENOUS | Status: AC
  Filled 2019-03-27: qty 50

## 2019-03-27 MED ORDER — ALBUTEROL SULFATE HFA 108 (90 BASE) MCG/ACT IN AERS: 2.0000 | INHALATION_SPRAY | Freq: Four times a day (QID) | RESPIRATORY_TRACT | 0 refills | Status: DC | PRN

## 2019-03-27 MED ORDER — MEGESTROL ACETATE 400 MG/10ML PO SUSP: 400.0000 mg | Freq: Every day | ORAL | 2 refills | Status: AC

## 2019-03-27 MED ORDER — OXYBUTYNIN CHLORIDE 5 MG PO TABS: 5.0000 mg | ORAL_TABLET | Freq: Two times a day (BID) | ORAL | 0 refills | Status: DC

## 2019-03-27 MED ORDER — MECLIZINE HCL 12.5 MG PO TABS: 12.5000 mg | ORAL_TABLET | Freq: Three times a day (TID) | ORAL | 0 refills | Status: AC | PRN

## 2019-03-27 MED ORDER — POTASSIUM 99 MG PO TABS: 1.0000 | ORAL_TABLET | Freq: Every day | ORAL | 0 refills | Status: DC

## 2019-03-27 MED ORDER — LOVASTATIN 40 MG PO TABS: 40.0000 mg | ORAL_TABLET | Freq: Every day | ORAL | 0 refills | Status: AC

## 2019-03-27 MED ORDER — TRIAMCINOLONE ACETONIDE 0.1 % EX CREA: 1.0000 "application " | TOPICAL_CREAM | Freq: Two times a day (BID) | CUTANEOUS | 0 refills | Status: DC | PRN

## 2019-03-27 MED ORDER — HYDROXYZINE HCL 10 MG PO TABS: 10.0000 mg | ORAL_TABLET | Freq: Three times a day (TID) | ORAL | 0 refills | Status: DC | PRN

## 2019-03-27 NOTE — Progress Notes (Signed)
Patient complaining of muscle spasms/cramps in the lower abdominal area. Robaxin given. MD Jannifer Franklin notified with order to change NPO to sips with meds. Robaxin did not relieve, gave 2mg  morphine.  Patient sleeping comfortably now, will continue to monitor. Stacie Glaze, RN

## 2019-03-27 NOTE — Transfer of Care (Signed)
Immediate Anesthesia Transfer of Care Note  Patient: Gloria Allen  Procedure(s) Performed: ESOPHAGOGASTRODUODENOSCOPY (EGD) WITH PROPOFOL (N/A )  Patient Location: PACU  Anesthesia Type:General  Level of Consciousness: sedated  Airway & Oxygen Therapy: Patient Spontanous Breathing and Patient connected to nasal cannula oxygen  Post-op Assessment: Report given to RN and Post -op Vital signs reviewed and stable  Post vital signs: Reviewed and stable  Last Vitals:  Vitals Value Taken Time  BP    Temp    Pulse    Resp    SpO2      Last Pain:  Vitals:   03/27/19 1246  TempSrc: Tympanic  PainSc:          Complications: No apparent anesthesia complications

## 2019-03-27 NOTE — Op Note (Signed)
[facility] Patient Name: Gloria Allen Procedure Date: 03/27/2019 1:09 PM MRN: [id number] Account #: [id number] Date of Birth: 09/02/1946 Admit Type: Emergency Department Age: 73 Room: [facility] ENDO ROOM 4 Gender: Female Note Status: Finalized Procedure:            Upper GI endoscopy Indications:          Melena Providers:            Lucilla Lame MD, MD Medicines:            Propofol per Anesthesia Complications:        No immediate complications. Procedure:            Pre-Anesthesia Assessment:                       - Prior to the procedure, a History and Physical was                        performed, and patient medications and allergies were                        reviewed. The patient's tolerance of previous                        anesthesia was also reviewed. The risks and benefits of                        the procedure and the sedation options and risks were                        discussed with the patient. All questions were                        answered, and informed consent was obtained. Prior                        Anticoagulants: The patient has taken no previous                        anticoagulant or antiplatelet agents. ASA Grade                        Assessment: III - A patient with severe systemic                        disease. After reviewing the risks and benefits, the                        patient was deemed in satisfactory condition to undergo                        the procedure.                       After obtaining informed consent, the endoscope was                        passed under direct vision. Throughout the procedure,                        the patient's blood pressure, pulse,  and oxygen                        saturations were monitored continuously. The Endoscope                        was introduced through the mouth, and advanced to the                        second part of duodenum. The upper GI endoscopy was                       accomplished without difficulty. The patient tolerated                        the procedure well. Findings:      The examined esophagus was normal.      The entire examined stomach was normal.      The examined duodenum was normal. Impression:           - Normal esophagus.                       - Normal stomach.                       - Normal examined duodenum.                       - No specimens collected. Recommendation:       - Return patient to hospital ward for ongoing care.                       - Resume regular diet.                       - Continue present medications. Procedure Code(s):    --- Professional ---                       [phone], Esophagogastroduodenoscopy, flexible, transoral;                        diagnostic, including collection of specimen(s) by                        brushing or washing, when performed (separate procedure) Diagnosis Code(s):    --- Professional ---                       K92.1, Melena (includes Hematochezia) CPT copyright 2019 [employer]. All rights reserved. The codes documented in this report are preliminary and upon coder review may  be revised to meet current compliance requirements. Lucilla Lame MD, MD 03/27/2019 1:31:31 PM This report has been signed electronically. Number of Addenda: 0 Note Initiated On: 03/27/2019 1:09 PM Estimated Blood Loss: Estimated blood loss: none.      [facility]

## 2019-03-27 NOTE — Anesthesia Post-op Follow-up Note (Signed)
Anesthesia QCDR form completed.        

## 2019-03-27 NOTE — Progress Notes (Signed)
Pt being discharged home, discharge instructions reviewed with pt, states understanding, taxi being called for transport, pt with no complaints

## 2019-03-27 NOTE — Anesthesia Preprocedure Evaluation (Addendum)
Anesthesia Evaluation  Patient identified by MRN, date of birth, ID band Patient awake    Reviewed: Allergy & Precautions, H&P , NPO status , Patient's Chart, lab work & pertinent test results  History of Anesthesia Complications Negative for: history of anesthetic complications  Airway Mallampati: II  TM Distance: >3 FB     Dental  (+) Chipped   Pulmonary shortness of breath and with exertion, asthma , COPD, former smoker,  H/o lung CA          Cardiovascular hypertension,      Neuro/Psych  Headaches, PSYCHIATRIC DISORDERS Anxiety Depression Chronic pain, neuropathies    GI/Hepatic Neg liver ROS, GERD  ,H/o colon CA   Endo/Other  negative endocrine ROS  Renal/GU CRFRenal disease  negative genitourinary   Musculoskeletal  (+) Arthritis ,   Abdominal   Peds  Hematology  (+) Blood dyscrasia (Hgb 9.2), anemia ,   Anesthesia Other Findings Past Medical History: No date: Anxiety No date: Arthritis No date: Asthma No date: Cancer ([place]) 07/30/2017: Cervical central spinal stenosis (C3-C7) (worse at C4-5) 07/30/2017: Cervical foraminal stenosis (C4-5 and C5-6) (Bilateral)  (L>R) 06/11/2017: Chronic lower extremity pain (Fourth Area of Pain)  (Bilateral) (R>L) 06/11/2017: Chronic neck pain (Primary Area of Pain) (Bilateral) (R>L) 06/11/2017: Chronic sacroiliac joint pain (Bilateral) (R>L) 06/11/2017: Chronic shoulder pain (Tertiary Area of Pain) (Right) 06/11/2017: Chronic upper extremity pain (Fifth Area of Pain)  (Bilateral) (R>L) No date: Colon cancer ([place]) 07/30/2017: DDD (degenerative disc disease), cervical 07/30/2017: DDD (degenerative disc disease), lumbar No date: Depression 07/30/2017: DISH (diffuse idiopathic skeletal hyperostosis) No date: Dyspnea     Comment:  with exertion 06/20/2017: Entrapment syndrome     Comment:  2002 on R and 2010 on L 06/12/2017: Full thickness rotator cuff tear No date: GERD  (gastroesophageal reflux disease) 07/30/2017: Grade 1 Anterolisthesis of L4 over L5 No date: Headache No date: Hx: UTI (urinary tract infection) No date: Hypertension 01/15/2017: Inflammation of joint of shoulder region 07/30/2017: Lumbar central spinal stenosis (L4-5) 07/30/2017: Lumbar disc protrusion (Left: L5-S1) (Right: L1-2)     Comment:  L5-S1 left foraminal protrusion with L5 impingement.               L1-2 right paracentral protrusion without impingement. 07/30/2017: Lumbar facet arthropathy (Bilateral) 07/30/2017: Lumbar facet syndrome (Bilateral) (R>L) No date: Lung cancer ([place]) 01/15/2017: Osteoarthritis of shoulder (Right)  Past Surgical History: No date: ABDOMINAL HYSTERECTOMY No date: APPENDECTOMY No date: BREAST REDUCTION SURGERY; Bilateral 11/05/2018: COLONOSCOPY WITH PROPOFOL; N/A     Comment:  Procedure: COLONOSCOPY WITH PROPOFOL;  Surgeon:               Virgel Manifold, MD;  Location: [facility];                Service: Endoscopy;  Laterality: N/A; 11/06/2018: COLONOSCOPY WITH PROPOFOL; N/A     Comment:  Procedure: COLONOSCOPY WITH PROPOFOL;  Surgeon:               Virgel Manifold, MD;  Location: [facility];                Service: Endoscopy;  Laterality: N/A; No date: COLOSTOMY No date: DILATION AND CURETTAGE OF UTERUS 11/05/2018: ESOPHAGOGASTRODUODENOSCOPY (EGD) WITH PROPOFOL; N/A     Comment:  Procedure: ESOPHAGOGASTRODUODENOSCOPY (EGD) WITH               PROPOFOL;  Surgeon: Virgel Manifold, MD;  Location:  [facility];  Service: Endoscopy;  Laterality: N/A; 11/27/2018: ILEOSTOMY; Right     Comment:  Procedure: ILEOSTOMY;  Surgeon: Jules Husbands, MD;                Location: [facility];  Service: General;  Laterality:               Right; 11/27/2018: LAPAROSCOPIC SIGMOID COLECTOMY; N/A     Comment:  Procedure: LAPAROSCOPIC SIGMOID COLECTOMY;  Surgeon:               Jules Husbands, MD;  Location: [facility];  Service:                General;  Laterality: N/A; 11/21/2018: ORIF ANKLE FRACTURE; Right     Comment:  Procedure: OPEN REDUCTION INTERNAL FIXATION (ORIF) ANKLE              FRACTURE;  Surgeon: Corky Mull, MD;  Location: [facility]               ORS;  Service: Orthopedics;  Laterality: Right; 03/12/2019: PORTACATH PLACEMENT; N/A     Comment:  Procedure: INSERTION PORT-A-CATH;  Surgeon: Jules Husbands, MD;  Location: [facility];  Service: General;                Laterality: N/A; No date: REDUCTION MAMMAPLASTY 03/26/2017: SHOULDER ARTHROSCOPY WITH OPEN ROTATOR CUFF REPAIR AND  DISTAL CLAVICLE ACROMINECTOMY; Right     Comment:  Procedure: right shoulder arthroscopy, arthroscopic               subacromial decompression, distal clavicle excision, mini              open rotator cuff repair;  Surgeon: Thornton Park, MD;              Location: [facility];  Service: Orthopedics;  Laterality:               Right;  BMI    Body Mass Index: 24.46 kg/m      Reproductive/Obstetrics negative OB ROS                         Anesthesia Physical Anesthesia Plan  ASA: III  Anesthesia Plan: General   Post-op Pain Management:    Induction:   PONV Risk Score and Plan: Propofol infusion and TIVA  Airway Management Planned: Natural Airway and Simple Face Mask  Additional Equipment:   Intra-op Plan:   Post-operative Plan:   Informed Consent: I have reviewed the patients History and Physical, chart, labs and discussed the procedure including the risks, benefits and alternatives for the proposed anesthesia with the patient or authorized representative who has indicated his/her understanding and acceptance.     Dental Advisory Given  Plan Discussed with: Anesthesiologist and CRNA  Anesthesia Plan Comments:        Anesthesia Quick Evaluation

## 2019-03-27 NOTE — Discharge Summary (Signed)
[place] [facility] [place] NAME: Gloria Allen    MR#:  [id number]  DATE OF BIRTH:  [birth year]  DATE OF ADMISSION:  03/24/2019 ADMITTING PHYSICIAN: Christel Mormon, MD  DATE OF DISCHARGE: 03/27/2019  PRIMARY CARE PHYSICIAN: [facility] [place] DIAGNOSIS:  Lower urinary tract infectious disease [N39.0]  DISCHARGE DIAGNOSIS:  Active Problems:   Sepsis ([place])   Blood in stool   SECONDARY DIAGNOSIS:   Past Medical History:  Diagnosis Date  . Anxiety   . Arthritis   . Asthma   . Cancer ([place])   . Cervical central spinal stenosis (C3-C7) (worse at C4-5) 07/30/2017  . Cervical foraminal stenosis (C4-5 and C5-6) (Bilateral) (L>R) 07/30/2017  . Chronic lower extremity pain (Fourth Area of Pain) (Bilateral) (R>L) 06/11/2017  . Chronic neck pain (Primary Area of Pain) (Bilateral) (R>L) 06/11/2017  . Chronic sacroiliac joint pain (Bilateral) (R>L) 06/11/2017  . Chronic shoulder pain [facility] Area of Pain) (Right) 06/11/2017  . Chronic upper extremity pain (Fifth Area of Pain) (Bilateral) (R>L) 06/11/2017  . Colon cancer ([place])   . DDD (degenerative disc disease), cervical 07/30/2017  . DDD (degenerative disc disease), lumbar 07/30/2017  . Depression   . DISH (diffuse idiopathic skeletal hyperostosis) 07/30/2017  . Dyspnea    with exertion  . Entrapment syndrome 06/20/2017   2002 on R and 2010 on L  . Full thickness rotator cuff tear 06/12/2017  . GERD (gastroesophageal reflux disease)   . Grade 1 Anterolisthesis of L4 over L5 07/30/2017  . Headache   . Hx: UTI (urinary tract infection)   . Hypertension   . Inflammation of joint of shoulder region 01/15/2017  . Lumbar central spinal stenosis (L4-5) 07/30/2017  . Lumbar disc protrusion (Left: L5-S1) (Right: L1-2) 07/30/2017   L5-S1 left foraminal protrusion with L5 impingement. L1-2 right paracentral protrusion without impingement.  . Lumbar facet arthropathy (Bilateral) 07/30/2017   . Lumbar facet syndrome (Bilateral) (R>L) 07/30/2017  . Lung cancer ([place])   . Osteoarthritis of shoulder (Right) 01/15/2017    HOSPITAL COURSE:   1.  Clinical sepsis secondary to urinary tract infection.  The patient had Jolie bodies on initial CBC which usually goes along with a bacterial infection.  Unfortunately the first urine culture was a contamination and the second urine culture was negative probably because antibiotics were already given.  Patient was given Rocephin during the hospital course and was given 2 more days of Keflex upon going home. 2.  Abdominal pain, black diarrhea in ostomy bag and drop in hemoglobin.  Endoscopy was negative.  Hemoglobin dropped from 12.6 down to 9.1 but then was stable at 9.2.  Stool in the ostomy on the day of discharge was brown.  CT initially was negative of the abdomen. 3.  Small cell lung cancer undergoing radiation therapy and following with oncology.  Follow-up with radiation oncology and oncology as outpatient. 4.  History of colon cancer status post resection and ostomy bag.  Patient has had chronic diarrhea since then.  Stool studies negative.  Restarted Imodium. 5.  Hypertension on Norvasc, hydralazine and clonidine.  I lowered down the clonidine to 0.2 mg twice a day 6.  Hyperlipidemia unspecified on pravastatin 7.  Back pain.  Oxycodone prescribed upon discharge home a few pills.  I did give 1 dose of Solu-Medrol just in case inflammatory.  I think it is likely because she has been lying in the bed for the few days that  she has been here. 8.  I renewed all of her medications 1 month supply. 9.  Thrush nystatin swish and swallow.  Discharge condition stable  CONSULTS OBTAINED:  Treatment Team:  Sindy Guadeloupe, MD Lucilla Lame, MD  DRUG ALLERGIES:   Allergies  Allergen Reactions  . Ace Inhibitors Swelling  . Angiotensin Receptor Blockers Swelling  . Sulfa Antibiotics Itching  . Chlorthalidone Rash  . Flexeril [Cyclobenzaprine] Rash     DISCHARGE MEDICATIONS:   Allergies as of 03/27/2019      Reactions   Ace Inhibitors Swelling   Angiotensin Receptor Blockers Swelling   Sulfa Antibiotics Itching   Chlorthalidone Rash   Flexeril [cyclobenzaprine] Rash      Medication List    STOP taking these medications   Anoro Ellipta 62.5-25 MCG/INH Aepb Generic drug: umeclidinium-vilanterol   HYDROcodone-acetaminophen 5-325 MG tablet Commonly known as: NORCO/VICODIN   lidocaine-prilocaine cream Commonly known as: EMLA   LORazepam 0.5 MG tablet Commonly known as: ATIVAN     TAKE these medications   albuterol 108 (90 Base) MCG/ACT inhaler Commonly known as: VENTOLIN HFA Inhale 2 puffs into the lungs every 6 (six) hours as needed for wheezing or shortness of breath.   amLODipine 10 MG tablet Commonly known as: NORVASC Take 1 tablet (10 mg total) by mouth daily.   Breo Ellipta 200-25 MCG/INH Aepb Generic drug: fluticasone furoate-vilanterol Inhale 1 puff into the lungs daily.   cephALEXin 500 MG capsule Commonly known as: KEFLEX Take 1 capsule (500 mg total) by mouth 3 (three) times daily for 6 doses.   clonazePAM 0.5 MG tablet Commonly known as: KLONOPIN Take 1 tablet (0.5 mg total) by mouth 3 (three) times daily.   cloNIDine 0.2 MG tablet Commonly known as: CATAPRES Take 1 tablet (0.2 mg total) by mouth 2 (two) times daily. What changed:   medication strength  how much to take   hydrALAZINE 100 MG tablet Commonly known as: APRESOLINE Take 1 tablet (100 mg total) by mouth 3 (three) times daily.   hydrOXYzine 10 MG tablet Commonly known as: ATARAX/VISTARIL Take 1 tablet (10 mg total) by mouth 3 (three) times daily as needed for itching.   loperamide 2 MG capsule Commonly known as: IMODIUM Take 2 capsules (4 mg total) by mouth 3 (three) times daily.   lovastatin 40 MG tablet Commonly known as: MEVACOR Take 1 tablet (40 mg total) by mouth at bedtime.   meclizine 12.5 MG tablet Commonly  known as: ANTIVERT Take 1 tablet (12.5 mg total) by mouth 3 (three) times daily as needed for dizziness.   megestrol 400 MG/10ML suspension Commonly known as: MEGACE Take 10 mLs (400 mg total) by mouth daily for 30 doses.   methocarbamol 500 MG tablet Commonly known as: Robaxin Take 1 tablet (500 mg total) by mouth at bedtime as needed for muscle spasms.   metoprolol succinate 100 MG 24 hr tablet Commonly known as: TOPROL-XL Take 1 tablet (100 mg total) by mouth daily. Take with or immediately following a meal. What changed: additional instructions   mirtazapine 15 MG tablet Commonly known as: REMERON take 1 tablet by mouth at bedtime for APPETITIE What changed:   how much to take  how to take this  when to take this   montelukast 10 MG tablet Commonly known as: SINGULAIR Take 1 tablet (10 mg total) by mouth at bedtime as needed.   nystatin 100000 UNIT/ML suspension Commonly known as: MYCOSTATIN Take 5 mLs (500,000 Units total) by mouth 4 (four)  times daily.   OLANZapine 10 MG tablet Commonly known as: ZYPREXA Take 1 tablet (10 mg total) by mouth at bedtime.   omeprazole 40 MG capsule Commonly known as: PRILOSEC Take 1 capsule (40 mg total) by mouth 2 (two) times daily.   ondansetron 8 MG tablet Commonly known as: Zofran Take 1 tablet (8 mg total) by mouth 2 (two) times daily as needed for refractory nausea / vomiting. Start on day 3 after carboplatin chemo.   oxybutynin 5 MG tablet Commonly known as: DITROPAN Take 1 tablet (5 mg total) by mouth 2 (two) times daily.   oxyCODONE-acetaminophen 5-325 MG tablet Commonly known as: PERCOCET/ROXICET Take 1 tablet by mouth every 6 (six) hours as needed for severe pain.   Potassium 99 MG Tabs Take 1 tablet (99 mg total) by mouth daily after breakfast. What changed:   when to take this  reasons to take this   prochlorperazine 10 MG tablet Commonly known as: COMPAZINE Take 1 tablet (10 mg total) by mouth every 6  (six) hours as needed (Nausea or vomiting).   sertraline 100 MG tablet Commonly known as: ZOLOFT Take 2 tablets (200 mg total) by mouth at bedtime.   terazosin 1 MG capsule Commonly known as: HYTRIN Take 1 capsule (1 mg total) by mouth at bedtime. What changed:   medication strength  See the new instructions.   traZODone 50 MG tablet Commonly known as: DESYREL Take 0.5-1 tablets (25-50 mg total) by mouth at bedtime as needed for sleep.   triamcinolone cream 0.1 % Commonly known as: KENALOG Apply 1 application topically 2 (two) times daily as needed (itching). What changed: See the new instructions.        DISCHARGE INSTRUCTIONS:   Follow-up PMD 5 days Follow-up oncology 1 week Follow-up radiation oncology as scheduled  If you experience worsening of your admission symptoms, develop shortness of breath, life threatening emergency, suicidal or homicidal thoughts you must seek medical attention immediately by calling 911 or calling your MD immediately  if symptoms less severe.  You Must read complete instructions/literature along with all the possible adverse reactions/side effects for all the Medicines you take and that have been prescribed to you. Take any new Medicines after you have completely understood and accept all the possible adverse reactions/side effects.   Please note  You were cared for by a hospitalist during your hospital stay. If you have any questions about your discharge medications or the care you received while you were in the hospital after you are discharged, you can call the unit and asked to speak with the hospitalist on call if the hospitalist that took care of you is not available. Once you are discharged, your primary care physician will handle any further medical issues. Please note that NO REFILLS for any discharge medications will be authorized once you are discharged, as it is imperative that you return to your primary care physician (or establish  a relationship with a primary care physician if you do not have one) for your aftercare needs so that they can reassess your need for medications and monitor your lab values.    Today   CHIEF COMPLAINT:   Chief Complaint  Patient presents with  . Abdominal Pain  . Nausea    HISTORY OF PRESENT ILLNESS:  Gloria Allen  is a 73 y.o. female with a known history presented with nausea and abdominal pain   VITAL SIGNS:  Blood pressure 129/77, pulse 65, temperature (!) 96.8 F (36 C), temperature  source Tympanic, resp. rate 15, height 5\' 5"  (1.651 m), weight 66.7 kg, SpO2 100 %.   PHYSICAL EXAMINATION:  GENERAL:  73 y.o.-year-old patient lying in the bed with no acute distress.  EYES: Pupils equal, round, reactive to light and accommodation. No scleral icterus. Extraocular muscles intact.  HEENT: Head atraumatic, normocephalic. Oropharynx and nasopharynx clear.  NECK:  Supple, no jugular venous distention. No thyroid enlargement, no tenderness.  LUNGS: Normal breath sounds bilaterally, no wheezing, rales,rhonchi or crepitation. No use of accessory muscles of respiration.  CARDIOVASCULAR: S1, S2 normal. No murmurs, rubs, or gallops.  ABDOMEN: Soft, slight lower abdominal tenderness, non-distended. Bowel sounds present. No organomegaly or mass.  EXTREMITIES: No pedal edema, cyanosis, or clubbing.  NEUROLOGIC: Cranial nerves II through XII are intact. Muscle strength 5/5 in all extremities. Sensation intact. Gait not checked.  PSYCHIATRIC: The patient is alert and oriented x 3.  SKIN: No obvious rash, lesion, or ulcer.   DATA REVIEW:   CBC Recent Labs  Lab 03/27/19 0605  WBC 4.7  HGB 9.2*  HCT 28.1*  PLT 126*    Chemistries  Recent Labs  Lab 03/24/19 1953  03/26/19 0417  NA 132*   < > 139  K 2.9*   < > 3.1*  CL 95*   < > 108  CO2 22   < > 22  GLUCOSE 168*   < > 123*  BUN 17   < > 12  CREATININE 0.81   < > 0.54  CALCIUM 9.3   < > 8.4*  AST 27  --   --   ALT 31  --   --    ALKPHOS 142*  --   --   BILITOT 0.8  --   --    < > = values in this interval not displayed.     Microbiology Results  Results for orders placed or performed during the hospital encounter of 03/24/19  Novel Coronavirus,NAA,(SEND-OUT TO REF LAB - TAT 24-48 hrs); Hosp Order     Status: None   Collection Time: 03/24/19  9:35 PM   Specimen: Nasopharyngeal Swab; Respiratory  Result Value Ref Range Status   SARS-CoV-2, NAA NOT DETECTED NOT DETECTED Final    Comment: (NOTE) This test was developed and its performance characteristics determined by [employer]. This test has not been FDA cleared or approved. This test has been authorized by FDA under an Emergency Use Authorization (EUA). This test is only authorized for the duration of time the declaration that circumstances exist justifying the authorization of the emergency use of in vitro diagnostic tests for detection of SARS-CoV-2 virus and/or diagnosis of COVID-19 infection under section 564(b)(1) of the Act, 21 U.S.C. 767MCN-4(B)(0), unless the authorization is terminated or revoked sooner. When diagnostic testing is negative, the possibility of a false negative result should be considered in the context of a patient's recent exposures and the presence of clinical signs and symptoms consistent with COVID-19. An individual without symptoms of COVID-19 and who is not shedding SARS-CoV-2 virus would expect to have a negative (not detected) result in this assay. Performed  At: [facility] [place], [place] [id number] Rush Farmer MD UT:[id number]    [place]  Final    Comment: Performed at [facility], [address] [place], [address] [address]  Urine Culture     Status: Abnormal   Collection Time: 03/24/19 11:15 PM   Specimen: Urine, Clean Catch  Result Value Ref Range Status  Specimen Description   Final    URINE, CLEAN CATCH Performed at [facility], [address], [place], [place] [address]    Special Requests   Final    Immunocompromised Performed at [facility], [address] [place], [place] [address]    Culture (A)  Final    <10,000 COLONIES/mL INSIGNIFICANT GROWTH Performed at [place] [address], [place], [place] [address]    Report Status 03/26/2019 FINAL  Final  Gastrointestinal Panel by PCR , Stool     Status: None   Collection Time: 03/26/19 10:41 AM   Specimen: STOOL  Result Value Ref Range Status   Campylobacter species NOT DETECTED NOT DETECTED Final   Plesimonas shigelloides NOT DETECTED NOT DETECTED Final   Salmonella species NOT DETECTED NOT DETECTED Final   Yersinia enterocolitica NOT DETECTED NOT DETECTED Final   Vibrio species NOT DETECTED NOT DETECTED Final   Vibrio cholerae NOT DETECTED NOT DETECTED Final   Enteroaggregative E coli (EAEC) NOT DETECTED NOT DETECTED Final   Enteropathogenic E coli (EPEC) NOT DETECTED NOT DETECTED Final   Enterotoxigenic E coli (ETEC) NOT DETECTED NOT DETECTED Final   Shiga like toxin producing E coli (STEC) NOT DETECTED NOT DETECTED Final   E. coli O157 NOT DETECTED NOT DETECTED Final   Shigella/Enteroinvasive E coli (EIEC) NOT DETECTED NOT DETECTED Final   Cryptosporidium NOT DETECTED NOT DETECTED Final   Cyclospora cayetanensis NOT DETECTED NOT DETECTED Final   Entamoeba histolytica NOT DETECTED NOT DETECTED Final   Giardia lamblia NOT DETECTED NOT DETECTED Final   Adenovirus F40/41 NOT DETECTED NOT DETECTED Final   Astrovirus NOT DETECTED NOT DETECTED Final   Norovirus GI/GII NOT DETECTED NOT DETECTED Final   Rotavirus A NOT DETECTED NOT DETECTED Final   Sapovirus (I, II, IV, and V) NOT DETECTED NOT DETECTED Final    Comment: Performed at [facility], [address] [place], [place] [address]  C difficile quick scan w PCR reflex     Status: None   Collection Time: 03/26/19 10:41 AM   Specimen: STOOL  Result Value Ref  Range Status   C Diff antigen NEGATIVE NEGATIVE Final   C Diff toxin NEGATIVE NEGATIVE Final   C Diff interpretation No C. difficile detected.  Final    Comment: Performed at [facility], [address], [place], [place] [address]      Management plans discussed with the patient, family and they are in agreement.  CODE STATUS:     Code Status Orders  (From admission, onward)         Start     Ordered   03/25/19 0220  Full code  Continuous     03/25/19 0221        Code Status History    Date Active Date Inactive Code Status Order ID Comments User Context   11/28/2018 1137 12/03/2018 1801 DNR [id number]  Demetrios Loll, MD Inpatient   11/27/2018 2029 11/28/2018 1137 Full Code [id number]  Jules Husbands, MD Inpatient   11/21/2018 1945 11/22/2018 0017 Full Code [id number]  Corky Mull, MD ED   03/26/2017 1223 03/27/2017 1740 Full Code [id number]  Thornton Park, MD Inpatient   Advance Care Planning Activity      TOTAL TIME TAKING CARE OF THIS PATIENT: 37 minutes.    Loletha Grayer M.D on 03/27/2019 at 3:15 PM  Between 7am to 6pm - Pager - [phone]  After 6pm go to [email or web address] - password [email or web address]  [place]  [phone]  CC: Primary care physician; [employer]

## 2019-03-27 NOTE — TOC Transition Note (Signed)
Transition of Care [facility]) - CM/SW Discharge Note   Patient Details  Name: Gloria Allen MRN: [id number] Date of Birth: January 24, 1946  [facility] CM/SW Contact:  Latanya Maudlin, RN Phone Number: 03/27/2019, 3:17 PM   Clinical Narrative:  Patient to be discharged per MD order. Orders in place for home health services. Patient active with [facility] and wishes to resume. Notified Cheryl of discharge. No DME needs. Patient is unbale to get transportation and per the patient typically uses the cancer center [facility] to get to appointments and to her treatments. We will use cab voucher. She has a way into the home.      Final next level of care: [place] Barriers to Discharge: No Barriers Identified   Patient Goals and CMS Choice Patient states their goals for this hospitalization and ongoing recovery are:: to get home CMS Medicare.gov Compare Post Acute Care list provided to:: Patient Choice offered to / list presented to : Patient  Discharge Placement                       Discharge Plan and Services                          HH Arranged: RN, PT [facility] Agency: [place] Date [facility] Agency Contacted: 03/27/19 Time [place]: [id number] Representative spoke with at [place]: [place] ([place]) Interventions     Readmission Risk Interventions Readmission Risk Prevention Plan 03/27/2019  Transportation Screening Complete  HRI or Home Care Consult Complete  Medication Review Press photographer) Complete  Some recent data might be hidden

## 2019-03-27 NOTE — Care Management Important Message (Signed)
Important Message  Patient Details  Name: Gloria Allen MRN: [id number] Date of Birth: Jul 28, 1946   Medicare Important Message Given:  Yes     [name] 03/27/2019, 11:21 AM

## 2019-03-30 ENCOUNTER — Ambulatory Visit
Admission: RE | Admit: 2019-03-30 | Discharge: 2019-03-30 | Disposition: A | Discharge status: Home / Self Care | Payer: Medicare Other | Source: Ambulatory Visit | Attending: Radiation Oncology | Admitting: Radiation Oncology

## 2019-03-30 ENCOUNTER — Other Ambulatory Visit: Payer: Self-pay

## 2019-03-30 ENCOUNTER — Ambulatory Visit: Payer: Medicare Other

## 2019-03-30 DIAGNOSIS — Z87891 Personal history of nicotine dependence: Secondary | ICD-10-CM | POA: Diagnosis not present

## 2019-03-30 DIAGNOSIS — Z510 Encounter for antineoplastic radiation therapy: Secondary | ICD-10-CM | POA: Diagnosis not present

## 2019-03-30 DIAGNOSIS — C3411 Malignant neoplasm of upper lobe, right bronchus or lung: Secondary | ICD-10-CM | POA: Diagnosis not present

## 2019-03-30 NOTE — Anesthesia Postprocedure Evaluation (Signed)
Anesthesia Post Note  Patient: Gloria Allen  Procedure(s) Performed: ESOPHAGOGASTRODUODENOSCOPY (EGD) WITH PROPOFOL (N/A )  Patient location during evaluation: PACU Anesthesia Type: General Level of consciousness: awake and alert Pain management: pain level controlled Vital Signs Assessment: post-procedure vital signs reviewed and stable Respiratory status: spontaneous breathing, nonlabored ventilation and respiratory function stable Cardiovascular status: blood pressure returned to baseline and stable Postop Assessment: no apparent nausea or vomiting Anesthetic complications: no     Last Vitals:  Vitals:   03/27/19 1404 03/27/19 1445  BP: (!) 113/50 129/77  Pulse: 61 65  Resp: 15   Temp:    SpO2: 99% 100%    Last Pain:  Vitals:   03/27/19 1526  TempSrc:   PainSc: [place]

## 2019-03-31 ENCOUNTER — Encounter: Payer: Self-pay | Admitting: Oncology

## 2019-03-31 ENCOUNTER — Inpatient Hospital Stay: Payer: Medicare Other

## 2019-03-31 ENCOUNTER — Other Ambulatory Visit: Payer: Self-pay

## 2019-03-31 ENCOUNTER — Ambulatory Visit
Admission: RE | Admit: 2019-03-31 | Discharge: 2019-03-31 | Disposition: A | Discharge status: Home / Self Care | Payer: Medicare Other | Source: Ambulatory Visit | Attending: Radiation Oncology | Admitting: Radiation Oncology

## 2019-03-31 ENCOUNTER — Inpatient Hospital Stay (HOSPITAL_BASED_OUTPATIENT_CLINIC_OR_DEPARTMENT_OTHER): Payer: Medicare Other | Admitting: Oncology

## 2019-03-31 VITALS — BP 165/97 | HR 77 | Temp 96.0°F | Resp 18 | Wt 146.8 lb

## 2019-03-31 DIAGNOSIS — E860 Dehydration: Secondary | ICD-10-CM | POA: Diagnosis not present

## 2019-03-31 DIAGNOSIS — I10 Essential (primary) hypertension: Secondary | ICD-10-CM | POA: Diagnosis not present

## 2019-03-31 DIAGNOSIS — R102 Pelvic and perineal pain: Secondary | ICD-10-CM | POA: Diagnosis not present

## 2019-03-31 DIAGNOSIS — C3411 Malignant neoplasm of upper lobe, right bronchus or lung: Secondary | ICD-10-CM

## 2019-03-31 DIAGNOSIS — D72829 Elevated white blood cell count, unspecified: Secondary | ICD-10-CM

## 2019-03-31 DIAGNOSIS — C187 Malignant neoplasm of sigmoid colon: Secondary | ICD-10-CM | POA: Diagnosis not present

## 2019-03-31 DIAGNOSIS — Z5189 Encounter for other specified aftercare: Secondary | ICD-10-CM | POA: Diagnosis not present

## 2019-03-31 DIAGNOSIS — E876 Hypokalemia: Secondary | ICD-10-CM

## 2019-03-31 DIAGNOSIS — C3490 Malignant neoplasm of unspecified part of unspecified bronchus or lung: Secondary | ICD-10-CM

## 2019-03-31 DIAGNOSIS — Z510 Encounter for antineoplastic radiation therapy: Secondary | ICD-10-CM | POA: Diagnosis not present

## 2019-03-31 DIAGNOSIS — Z95828 Presence of other vascular implants and grafts: Secondary | ICD-10-CM

## 2019-03-31 DIAGNOSIS — Z87891 Personal history of nicotine dependence: Secondary | ICD-10-CM | POA: Diagnosis not present

## 2019-03-31 DIAGNOSIS — Z5111 Encounter for antineoplastic chemotherapy: Secondary | ICD-10-CM | POA: Diagnosis not present

## 2019-03-31 DIAGNOSIS — R350 Frequency of micturition: Secondary | ICD-10-CM | POA: Diagnosis not present

## 2019-03-31 LAB — COMPREHENSIVE METABOLIC PANEL
ALT: 24 U/L (ref 0–44)
AST: 23 U/L (ref 15–41)
Albumin: 3.7 g/dL (ref 3.5–5.0)
Alkaline Phosphatase: 133 U/L — ABNORMAL HIGH (ref 38–126)
Anion gap: 11 (ref 5–15)
BUN: 8 mg/dL (ref 8–23)
CO2: 23 mmol/L (ref 22–32)
Calcium: 9.4 mg/dL (ref 8.9–10.3)
Chloride: 104 mmol/L (ref 98–111)
Creatinine, Ser: 0.83 mg/dL (ref 0.44–1.00)
GFR calc Af Amer: >60 mL/min (ref >60)
GFR calc non Af Amer: >60 mL/min (ref >60)
Glucose, Bld: 160 mg/dL — ABNORMAL HIGH (ref 70–99)
Potassium: 3 mmol/L — ABNORMAL LOW (ref 3.5–5.1)
Sodium: 138 mmol/L (ref 135–145)
Total Bilirubin: 0.3 mg/dL (ref 0.3–1.2)
Total Protein: 7.1 g/dL (ref 6.5–8.1)

## 2019-03-31 LAB — CBC WITH DIFFERENTIAL/PLATELET
Abs Immature Granulocytes: 3.42 10*3/uL — ABNORMAL HIGH (ref 0.00–0.07)
Basophils Absolute: 0.1 10*3/uL (ref 0.0–0.1)
Basophils Relative: 0 %
Eosinophils Absolute: 0 10*3/uL (ref 0.0–0.5)
Eosinophils Relative: 0 %
HCT: 32.1 % — ABNORMAL LOW (ref 36.0–46.0)
Hemoglobin: 10.6 g/dL — ABNORMAL LOW (ref 12.0–15.0)
Immature Granulocytes: 11 %
Lymphocytes Relative: 6 %
Lymphs Abs: 1.7 10*3/uL (ref 0.7–4.0)
MCH: 27.2 pg (ref 26.0–34.0)
MCHC: 33 g/dL (ref 30.0–36.0)
MCV: 82.3 fL (ref 80.0–100.0)
Monocytes Absolute: 1.5 10*3/uL — ABNORMAL HIGH (ref 0.1–1.0)
Monocytes Relative: 5 %
Neutro Abs: 24.5 10*3/uL — ABNORMAL HIGH (ref 1.7–7.7)
Neutrophils Relative %: 78 %
Platelets: 139 10*3/uL — ABNORMAL LOW (ref 150–400)
RBC: 3.9 MIL/uL (ref 3.87–5.11)
RDW: 14.6 % (ref 11.5–15.5)
Smear Review: NORMAL
WBC: 31.1 10*3/uL — ABNORMAL HIGH (ref 4.0–10.5)
nRBC: 0.3 % — ABNORMAL HIGH (ref 0.0–0.2)

## 2019-03-31 MED ORDER — HEPARIN SOD (PORK) LOCK FLUSH 100 UNIT/ML IV SOLN
500.0000 [IU] | Freq: Once | INTRAVENOUS | Status: AC
  Administered 2019-03-31: 500 [IU] via INTRAVENOUS

## 2019-03-31 MED ORDER — SODIUM CHLORIDE 0.9% FLUSH
10.0000 mL | INTRAVENOUS | Status: DC | PRN
  Administered 2019-03-31: 10 mL via INTRAVENOUS
  Filled 2019-03-31: qty 10

## 2019-03-31 MED ORDER — SODIUM CHLORIDE 0.9 % IV SOLN
Freq: Once | INTRAVENOUS | Status: AC
  Administered 2019-03-31: 11:00:00 via INTRAVENOUS
  Filled 2019-03-31: qty 1000

## 2019-03-31 NOTE — Progress Notes (Signed)
Patient here for follow up. Pt states she had abdominal and nausea yesterday. Today she feels weak.

## 2019-03-31 NOTE — Progress Notes (Signed)
Hematology/Oncology Consult note [facility]  Telephone:([phone] [phone]  Patient Care Team: Inc, [facility] as PCP - General Telford Nab, [place] as Registered Nurse   Name of the patient: Gloria Allen  [id number]  May 29, 1946   Date of visit: 03/31/19  Diagnosis- 1.Stage III colon cancer status post surgery 2.New diagnosis of limited stage small cell lung cancer  Chief complaint/ Reason for visit-post hospital discharge follow-up  Heme/Onc history: patient is a 73 year old African-American female who recently underwent screening colonoscopy by Dr. Bonna Gains.Colonoscopy showed villous nonobstructing large mass in the sigmoid colon 12 cm proximal to the anus. Mass was partially circumferential. Biopsy showed invasive adenocarcinoma. Patient was seen by Dr. Gretchen Short underwent hemicolectomy with lymph node sampling on 11/27/2018. Final pathology showed invasive colorectal carcinoma 3.5 cm, grade 2. Tumor invades through muscularis propria into the peri-colorectal tissue. Margins negative. Tumor deposits present, 2. 1 out of 9 lymph nodes examined was positive for malignancy. [id number].Patient was feeling poorly after surgery and she had no social support and decided against adjuvant chemotherapy.  Patient underwent CT chest to complete her staging work-up which showed 2 lung nodules one in the right upper lobe and one in the left left upper lobe.This was followed by a PET CT scan which showed with the left upper lobe nodule was not hypermetabolic.The right upper lobe lung nodule measure 1.6 cm with an SUV of 6.8. No hypermetabolic axillary mediastinal or hilar lymphadenopathy or evidence of metastatic disease elsewhere. Patient underwent CT-guided biopsy of the right upper lobe lung nodule which was consistent with small cell lung cancerpositive for CD56 and TTF-1   Interval history-patient was hospitalized last week for  uncontrolled abdominal pain.  CT abdomen was unremarkable.  She was treated for UTI and discharged on oral antibiotics.  Patient states that her abdominal pain is improved but she still has occasional bouts of pain.  At this time she feels that her abdomen is bloated and she sometimes has difficulty in moving her bowels  ECOG PS- 1 Pain scale- 0 Opioid associated constipation- no  Review of systems- Review of Systems  Constitutional: Positive for malaise/fatigue. Negative for chills, fever and weight loss.  HENT: Negative for congestion, ear discharge and nosebleeds.   Eyes: Negative for blurred vision.  Respiratory: Negative for cough, hemoptysis, sputum production, shortness of breath and wheezing.   Cardiovascular: Negative for chest pain, palpitations, orthopnea and claudication.  Gastrointestinal: Negative for abdominal pain, blood in stool, constipation, diarrhea, heartburn, melena, nausea and vomiting.  Genitourinary: Negative for dysuria, flank pain, frequency, hematuria and urgency.  Musculoskeletal: Negative for back pain, joint pain and myalgias.  Skin: Negative for rash.  Neurological: Negative for dizziness, tingling, focal weakness, seizures, weakness and headaches.  Endo/Heme/Allergies: Does not bruise/bleed easily.  Psychiatric/Behavioral: Negative for depression and suicidal ideas. The patient does not have insomnia.       Allergies  Allergen Reactions  . Ace Inhibitors Swelling  . Angiotensin Receptor Blockers Swelling  . Sulfa Antibiotics Itching  . Chlorthalidone Rash  . Flexeril [Cyclobenzaprine] Rash     Past Medical History:  Diagnosis Date  . Anxiety   . Arthritis   . Asthma   . Cancer ([place])   . Cervical central spinal stenosis (C3-C7) (worse at C4-5) 07/30/2017  . Cervical foraminal stenosis (C4-5 and C5-6) (Bilateral) (L>R) 07/30/2017  . Chronic lower extremity pain (Fourth Area of Pain) (Bilateral) (R>L) 06/11/2017  . Chronic neck pain (Primary Area  of Pain) (Bilateral) (R>L)  06/11/2017  . Chronic sacroiliac joint pain (Bilateral) (R>L) 06/11/2017  . Chronic shoulder pain [facility] Area of Pain) (Right) 06/11/2017  . Chronic upper extremity pain (Fifth Area of Pain) (Bilateral) (R>L) 06/11/2017  . Colon cancer ([place])   . DDD (degenerative disc disease), cervical 07/30/2017  . DDD (degenerative disc disease), lumbar 07/30/2017  . Depression   . DISH (diffuse idiopathic skeletal hyperostosis) 07/30/2017  . Dyspnea    with exertion  . Entrapment syndrome 06/20/2017   2002 on R and 2010 on L  . Full thickness rotator cuff tear 06/12/2017  . GERD (gastroesophageal reflux disease)   . Grade 1 Anterolisthesis of L4 over L5 07/30/2017  . Headache   . Hx: UTI (urinary tract infection)   . Hypertension   . Inflammation of joint of shoulder region 01/15/2017  . Lumbar central spinal stenosis (L4-5) 07/30/2017  . Lumbar disc protrusion (Left: L5-S1) (Right: L1-2) 07/30/2017   L5-S1 left foraminal protrusion with L5 impingement. L1-2 right paracentral protrusion without impingement.  . Lumbar facet arthropathy (Bilateral) 07/30/2017  . Lumbar facet syndrome (Bilateral) (R>L) 07/30/2017  . Lung cancer ([place])   . Osteoarthritis of shoulder (Right) 01/15/2017     Past Surgical History:  Procedure Laterality Date  . ABDOMINAL HYSTERECTOMY    . APPENDECTOMY    . BREAST REDUCTION SURGERY Bilateral   . COLONOSCOPY WITH PROPOFOL N/A 11/05/2018   Procedure: COLONOSCOPY WITH PROPOFOL;  Surgeon: Virgel Manifold, MD;  Location: [facility] ENDOSCOPY;  Service: Endoscopy;  Laterality: N/A;  . COLONOSCOPY WITH PROPOFOL N/A 11/06/2018   Procedure: COLONOSCOPY WITH PROPOFOL;  Surgeon: Virgel Manifold, MD;  Location: [facility] ENDOSCOPY;  Service: Endoscopy;  Laterality: N/A;  . COLOSTOMY    . DILATION AND CURETTAGE OF UTERUS    . ESOPHAGOGASTRODUODENOSCOPY (EGD) WITH PROPOFOL N/A 11/05/2018   Procedure: ESOPHAGOGASTRODUODENOSCOPY (EGD) WITH PROPOFOL;  Surgeon:  Virgel Manifold, MD;  Location: [facility] ENDOSCOPY;  Service: Endoscopy;  Laterality: N/A;  . ESOPHAGOGASTRODUODENOSCOPY (EGD) WITH PROPOFOL N/A 03/27/2019   Procedure: ESOPHAGOGASTRODUODENOSCOPY (EGD) WITH PROPOFOL;  Surgeon: Lucilla Lame, MD;  Location[facility] ENDOSCOPY;  Service: Endoscopy;  Laterality: N/A;  . ILEOSTOMY Right 11/27/2018   Procedure: ILEOSTOMY;  Surgeon: Jules Husbands, MD;  Location: [facility] ORS;  Service: General;  Laterality: Right;  . LAPAROSCOPIC SIGMOID COLECTOMY N/A 11/27/2018   Procedure: LAPAROSCOPIC SIGMOID COLECTOMY;  Surgeon: Jules Husbands, MD;  Location: [facility] ORS;  Service: General;  Laterality: N/A;  . ORIF ANKLE FRACTURE Right 11/21/2018   Procedure: OPEN REDUCTION INTERNAL FIXATION (ORIF) ANKLE FRACTURE;  Surgeon: Corky Mull, MD;  Location: [facility] ORS;  Service: Orthopedics;  Laterality: Right;  . PORTACATH PLACEMENT N/A 03/12/2019   Procedure: INSERTION PORT-A-CATH;  Surgeon: Jules Husbands, MD;  Location: [facility] ORS;  Service: General;  Laterality: N/A;  . REDUCTION MAMMAPLASTY    . SHOULDER ARTHROSCOPY WITH OPEN ROTATOR CUFF REPAIR AND DISTAL CLAVICLE ACROMINECTOMY Right 03/26/2017   Procedure: right shoulder arthroscopy, arthroscopic subacromial decompression, distal clavicle excision, mini open rotator cuff repair;  Surgeon: Thornton Park, MD;  Location: [facility] ORS;  Service: Orthopedics;  Laterality: Right;    Social History   Socioeconomic History  . Marital status: Divorced    Spouse name: Not on file  . Number of children: 3  . Years of education: Not on file  . Highest education level: 11th grade  Occupational History  . Not on file  Social Needs  . Financial resource strain: Somewhat hard  . Food insecurity    Worry:  Sometimes true    Inability: Never true  . Transportation needs    Medical: Yes    Non-medical: No  Tobacco Use  . Smoking status: Former Smoker    Packs/day: 1.00    Types: Cigarettes    Quit date: 2010    Years since  quitting: 10.5  . Smokeless tobacco: Never Used  . Tobacco comment: quit over 30 years ago   Substance and Sexual Activity  . Alcohol use: No  . Drug use: Yes    Types: Marijuana  . Sexual activity: Never  Lifestyle  . Physical activity    Days per week: 0 days    Minutes per session: 0 min  . Stress: Not at all  Relationships  . Social Herbalist on phone: Never    Gets together: Never    Attends religious service: More than 4 times per year    Active member of club or organization: No    Attends meetings of clubs or organizations: Never    Relationship status: Divorced  . Intimate partner violence    Fear of current or ex partner: No    Emotionally abused: No    Physically abused: No    Forced sexual activity: No  Other Topics Concern  . 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 She receives disability. She has 3 children and several grandchildren. She lives alone. She drives. Uses a walker and shower chair at baseline.     Family History  Problem Relation Age of Onset  . Hypertension Mother   . Breast cancer Mother 24  . Breast cancer Sister 60     Current Outpatient Medications:  .  albuterol (VENTOLIN HFA) 108 (90 Base) MCG/ACT inhaler, Inhale 2 puffs into the lungs every 6 (six) hours as needed for wheezing or shortness of breath., Disp: 18 g, Rfl: 0 .  amLODipine (NORVASC) 10 MG tablet, Take 1 tablet (10 mg total) by mouth daily., Disp: 30 tablet, Rfl: 0 .  BREO ELLIPTA 200-25 MCG/INH AEPB, Inhale 1 puff into the lungs daily., Disp: 60 each, Rfl: 0 .  clonazePAM (KLONOPIN) 0.5 MG tablet, Take 1 tablet (0.5 mg total) by mouth 3 (three) times daily., Disp: 30 tablet, Rfl: 0 .  cloNIDine (CATAPRES) 0.2 MG tablet, Take 1 tablet (0.2 mg total) by mouth 2 (two) times daily., Disp: 60 tablet, Rfl: 0 .  hydrALAZINE (APRESOLINE) 100 MG tablet, Take 1 tablet (100 mg total) by mouth 3 (three) times daily., Disp: 90 tablet,  Rfl: 0 .  hydrOXYzine (ATARAX/VISTARIL) 10 MG tablet, Take 1 tablet (10 mg total) by mouth 3 (three) times daily as needed for itching., Disp: 30 tablet, Rfl: 0 .  loperamide (IMODIUM) 2 MG capsule, Take 2 capsules (4 mg total) by mouth 3 (three) times daily., Disp: 180 capsule, Rfl: 0 .  lovastatin (MEVACOR) 40 MG tablet, Take 1 tablet (40 mg total) by mouth at bedtime., Disp: 30 tablet, Rfl: 0 .  meclizine (ANTIVERT) 12.5 MG tablet, Take 1 tablet (12.5 mg total) by mouth 3 (three) times daily as needed for dizziness., Disp: 30 tablet, Rfl: 0 .  megestrol (MEGACE) 400 MG/10ML suspension, Take 10 mLs (400 mg total) by mouth daily for 30 doses., Disp: 480 mL, Rfl: 2 .  methocarbamol (ROBAXIN) 500 MG tablet, Take 1 tablet (500 mg total) by mouth at bedtime as needed for muscle spasms., Disp: 30 tablet, Rfl: 0 .  metoprolol succinate (TOPROL-XL) 100 MG 24 hr tablet, Take 1 tablet (100 mg total) by mouth daily. Take with or immediately following a meal., Disp: 30 tablet, Rfl: 0 .  mirtazapine (REMERON) 15 MG tablet, take 1 tablet by mouth at bedtime for APPETITIE, Disp: 30 tablet, Rfl: 0 .  montelukast (SINGULAIR) 10 MG tablet, Take 1 tablet (10 mg total) by mouth at bedtime as needed., Disp: 30 tablet, Rfl: 0 .  nystatin (MYCOSTATIN) 100000 UNIT/ML suspension, Take 5 mLs (500,000 Units total) by mouth 4 (four) times daily., Disp: 60 mL, Rfl: 0 .  OLANZapine (ZYPREXA) 10 MG tablet, Take 1 tablet (10 mg total) by mouth at bedtime., Disp: 30 tablet, Rfl: 0 .  omeprazole (PRILOSEC) 40 MG capsule, Take 1 capsule (40 mg total) by mouth 2 (two) times daily., Disp: 60 capsule, Rfl: 0 .  ondansetron (ZOFRAN) 8 MG tablet, Take 1 tablet (8 mg total) by mouth 2 (two) times daily as needed for refractory nausea / vomiting. Start on day 3 after carboplatin chemo., Disp: 30 tablet, Rfl: 0 .  oxybutynin (DITROPAN) 5 MG tablet, Take 1 tablet (5 mg total) by mouth 2 (two) times daily., Disp: 60 tablet, Rfl: 0 .   oxyCODONE-acetaminophen (PERCOCET/ROXICET) 5-325 MG tablet, Take 1 tablet by mouth every 6 (six) hours as needed for severe pain., Disp: 12 tablet, Rfl: 0 .  Potassium 99 MG TABS, Take 1 tablet (99 mg total) by mouth daily after breakfast., Disp: 30 tablet, Rfl: 0 .  prochlorperazine (COMPAZINE) 10 MG tablet, Take 1 tablet (10 mg total) by mouth every 6 (six) hours as needed (Nausea or vomiting)., Disp: 30 tablet, Rfl: 0 .  sertraline (ZOLOFT) 100 MG tablet, Take 2 tablets (200 mg total) by mouth at bedtime., Disp: 60 tablet, Rfl: 0 .  terazosin (HYTRIN) 1 MG capsule, Take 1 capsule (1 mg total) by mouth at bedtime., Disp: 30 capsule, Rfl: 0 .  traZODone (DESYREL) 50 MG tablet, Take 0.5-1 tablets (25-50 mg total) by mouth at bedtime as needed for sleep., Disp: 30 tablet, Rfl: 0 .  triamcinolone cream (KENALOG) 0.1 %, Apply 1 application topically 2 (two) times daily as needed (itching)., Disp: 15 g, Rfl: 0  Physical exam:  Vitals:   03/31/19 0942  BP: (!) 165/97  Pulse: 77  Resp: 18  Temp: (!) 96 F (35.6 C)  Weight: 146 lb 12.8 oz (66.6 kg)   Physical Exam Constitutional:      Comments: Sitting in a wheelchair.  Appears fatigued  HENT:     Head: Normocephalic and atraumatic.  Eyes:     Pupils: Pupils are equal, round, and reactive to light.  Neck:     Musculoskeletal: Normal range of motion.  Cardiovascular:     Rate and Rhythm: Normal rate and regular rhythm.     Heart sounds: Normal heart sounds.  Pulmonary:     Effort: Pulmonary effort is normal.     Breath sounds: Normal breath sounds.  Abdominal:     General: Bowel sounds are normal.     Palpations: Abdomen is soft.     Tenderness: There is no abdominal tenderness.     Comments: Colostomy in place  Skin:    General: Skin is warm and dry.  Neurological:     Mental Status: She is alert and oriented to person, place, and time.      CMP Latest Ref Rng & Units 03/26/2019  Glucose 70 - 99 mg/dL 123(H)  BUN 8 - 23 mg/dL  12  Creatinine 0.44 - 1.00 mg/dL 0.54  Sodium 135 - 145 mmol/L 139  Potassium 3.5 - 5.1 mmol/L 3.1(L)  Chloride 98 - 111 mmol/L 108  CO2 22 - 32 mmol/L 22  Calcium 8.9 - 10.3 mg/dL 8.4(L)  Total Protein 6.5 - 8.1 g/dL -  Total Bilirubin 0.3 - 1.2 mg/dL -  Alkaline Phos 38 - 126 U/L -  AST 15 - 41 U/L -  ALT 0 - 44 U/L -   CBC Latest Ref Rng & Units 03/27/2019  WBC 4.0 - 10.5 K/uL 4.7  Hemoglobin 12.0 - 15.0 g/dL 9.2(L)  Hematocrit 36.0 - 46.0 % 28.1(L)  Platelets 150 - 400 K/uL 126(L)    No images are attached to the encounter.  Ct Chest W Contrast  Result Date: 03/24/2019 CLINICAL DATA:  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 Diagnosis May 2020. Currently undergoing chemotherapy and radiation. EXAM: CT CHEST, ABDOMEN, AND PELVIS WITH CONTRAST TECHNIQUE: Multidetector CT imaging of the chest, abdomen and pelvis was performed following the standard protocol during bolus administration of intravenous contrast. CONTRAST:  148mL OMNIPAQUE IOHEXOL 300 MG/ML  SOLN COMPARISON:  PET-CT 02/05/2019 FINDINGS: CT CHEST FINDINGS Cardiovascular: The heart is normal in size. No pericardial effusion. The aorta is normal in caliber. Moderate atherosclerotic calcifications. No dissection. The branch vessels are patent. Stable coronary artery calcifications. Mediastinum/Nodes: No mediastinal or hilar mass or lymphadenopathy. The esophagus is unremarkable. Lungs/Pleura: Stable underlying emphysematous changes. The right upper lobe pulmonary lesion measures 7.5 mm on image number 58 and previously measured 15.5 mm. Semi solid nodular left upper lobe lesion peripherally on image number 85 measures 18 mm and is unchanged. This was not hypermetabolic on the recent PET-CT but recommend continued observation/surveillance. A few tiny scattered subpleural nodules are stable and likely represent lymph nodes. No infiltrates or effusions. Musculoskeletal: No breast masses, supraclavicular or axillary  adenopathy. The right-sided Port-A-Cath is stable. The thyroid gland is unremarkable. Stable small right nodule. No significant bony findings. CT ABDOMEN PELVIS FINDINGS Hepatobiliary: Stable low-attenuation lesion in segment 6 most consistent with a benign hepatic cyst. No worrisome hepatic lesions or intrahepatic biliary dilatation. The gallbladder is surgically absent. Mild associated common bile duct dilatation. Pancreas: No mass, inflammation or ductal dilatation. Spleen: Normal size.  No focal lesions. Adrenals/Urinary Tract: The adrenal glands are normal in stable. No renal, ureteral or bladder calculi or mass. Stomach/Bowel: The stomach, duodenum, small bowel and colon are unremarkable. No acute inflammatory changes, mass lesions or obstructive findings. Patient has a loop ileostomy in the right lower quadrant. Prior left hemicolectomy with transverse colon to rectal anastomosis. There is dense contrast in the rectum with moderate artifact but no findings suspicious for residual or recurrent tumor. Vascular/Lymphatic: Stable aortic and iliac artery calcifications but no aneurysm. The major venous structures are patent. No mesenteric or retroperitoneal mass or adenopathy. No pelvic lymphadenopathy. Reproductive: The uterus is surgically absent. Both ovaries are still present and appear normal. There is a simple appearing cyst associated with the left ovary. Other: No free pelvic fluid collections or pelvic abscess. No inguinal mass or adenopathy. No subcutaneous lesions. Musculoskeletal: No significant bony findings. IMPRESSION: 1. Interval decrease in size of the right upper lobe pulmonary nodule now measuring 7.5 mm and previously measuring 15.5 mm. 2. Stable 18 mm semi-solid airspace nodule in the left upper lobe. Recommend continued surveillance. 3. No mediastinal or hilar mass or adenopathy. 4. Stable surgical changes involving the colon. No findings suspicious for recurrent tumor,  locoregional  adenopathy or distant metastatic disease. Electronically Signed   By: Marijo Sanes M.D.   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 Diagnosis May 2020. Currently undergoing chemotherapy and radiation. EXAM: CT CHEST, ABDOMEN, AND PELVIS WITH CONTRAST TECHNIQUE: Multidetector CT imaging of the chest, abdomen and pelvis was performed following the standard protocol during bolus administration of intravenous contrast. CONTRAST:  154mL OMNIPAQUE IOHEXOL 300 MG/ML  SOLN COMPARISON:  PET-CT 02/05/2019 FINDINGS: CT CHEST FINDINGS Cardiovascular: The heart is normal in size. No pericardial effusion. The aorta is normal in caliber. Moderate atherosclerotic calcifications. No dissection. The branch vessels are patent. Stable coronary artery calcifications. Mediastinum/Nodes: No mediastinal or hilar mass or lymphadenopathy. The esophagus is unremarkable. Lungs/Pleura: Stable underlying emphysematous changes. The right upper lobe pulmonary lesion measures 7.5 mm on image number 58 and previously measured 15.5 mm. Semi solid nodular left upper lobe lesion peripherally on image number 85 measures 18 mm and is unchanged. This was not hypermetabolic on the recent PET-CT but recommend continued observation/surveillance. A few tiny scattered subpleural nodules are stable and likely represent lymph nodes. No infiltrates or effusions. Musculoskeletal: No breast masses, supraclavicular or axillary adenopathy. The right-sided Port-A-Cath is stable. The thyroid gland is unremarkable. Stable small right nodule. No significant bony findings. CT ABDOMEN PELVIS FINDINGS Hepatobiliary: Stable low-attenuation lesion in segment 6 most consistent with a benign hepatic cyst. No worrisome hepatic lesions or intrahepatic biliary dilatation. The gallbladder is surgically absent. Mild associated common bile duct dilatation. Pancreas: No  mass, inflammation or ductal dilatation. Spleen: Normal size.  No focal lesions. Adrenals/Urinary Tract: The adrenal glands are normal in stable. No renal, ureteral or bladder calculi or mass. Stomach/Bowel: The stomach, duodenum, small bowel and colon are unremarkable. No acute inflammatory changes, mass lesions or obstructive findings. Patient has a loop ileostomy in the right lower quadrant. Prior left hemicolectomy with transverse colon to rectal anastomosis. There is dense contrast in the rectum with moderate artifact but no findings suspicious for residual or recurrent tumor. Vascular/Lymphatic: Stable aortic and iliac artery calcifications but no aneurysm. The major venous structures are patent. No mesenteric or retroperitoneal mass or adenopathy. No pelvic lymphadenopathy. Reproductive: The uterus is surgically absent. Both ovaries are still present and appear normal. There is a simple appearing cyst associated with the left ovary. Other: No free pelvic fluid collections or pelvic abscess. No inguinal mass or adenopathy. No subcutaneous lesions. Musculoskeletal: No significant bony findings. IMPRESSION: 1. Interval decrease in size of the right upper lobe pulmonary nodule now measuring 7.5 mm and previously measuring 15.5 mm. 2. Stable 18 mm semi-solid airspace nodule in the left upper lobe. Recommend continued surveillance. 3. No mediastinal or hilar mass or adenopathy. 4. Stable surgical changes involving the colon. No findings suspicious for recurrent tumor, locoregional adenopathy or distant metastatic disease. Electronically Signed   By: Marijo Sanes M.D.   On: 03/24/2019 15:26   Dg Chest Port 1 View  Result Date: 03/12/2019 CLINICAL DATA:  Port-A-Cath placement. EXAM: PORTABLE CHEST 1 VIEW COMPARISON:  Chest x-ray 02/12/2019 FINDINGS: Right IJ power port tip is at the level of the carina in the mid SVC. No complicating features. The cardiac silhouette, mediastinal and hilar contours are stable.  The lungs are clear. No pleural effusion. IMPRESSION: Right IJ power port in good position with the tip in the mid SVC. No complicating features. Electronically Signed  By: Marijo Sanes M.D.   On: 03/12/2019 14:26   Dg C-arm 1-60 Min-no Report  Result Date: 03/12/2019 Fluoroscopy was utilized by the requesting physician.  No radiographic interpretation.     Assessment and plan- Patient is a 73 y.o. female with history of stage III colon cancer status post resection (did not receive adjuvant chemotherapy).  Then had new diagnosis of limited stage small cell lung cancer status post cycle 1 of carboplatin and etoposide chemotherapy on 03/17/2019 with concurrent radiation.  This is a post hospital discharge follow-up  1.  Abdominal pain: She was admitted recently to the hospital for this.  CT abdomen was unremarkable.  She underwent treatment for possible UTI.  There is also some concern for a drop in hemoglobin which I think is secondary to her chemotherapy but was activated to a possible GI bleed and underwent an upper endoscopy which was unremarkable.  Continue to monitor  2.  Leukocytosis: Patient was given steroids for back pain.  Leukocytosis possibly secondary to this.  Clinically no signs and symptoms of infection.  Continue to monitor  3.  Patient still feels fatigued since her hospital stay.  She is trying to keep up with her oral intake.  She is going through daily radiation.  I will plan to give her 1 L of IV fluids today.  4.  Hypokalemia: Potassium 3.0 today.  We will give her 20 mEq of IV potassium today and give her a prescription for 20 mEq of oral potassium for 14 days   Visit Diagnosis 1. Dehydration   2. Hypokalemia   3. Leukocytosis, unspecified type      Dr. Randa Evens, MD, MPH [facility] [phone] 03/31/2019 10:59 AM

## 2019-04-01 ENCOUNTER — Ambulatory Visit
Admission: RE | Admit: 2019-04-01 | Discharge: 2019-04-01 | Disposition: A | Discharge status: Home / Self Care | Payer: Medicare Other | Source: Ambulatory Visit | Attending: Radiation Oncology | Admitting: Radiation Oncology

## 2019-04-01 ENCOUNTER — Other Ambulatory Visit: Payer: Self-pay

## 2019-04-01 ENCOUNTER — Other Ambulatory Visit: Payer: Self-pay | Admitting: *Deleted

## 2019-04-01 DIAGNOSIS — Z510 Encounter for antineoplastic radiation therapy: Secondary | ICD-10-CM | POA: Insufficient documentation

## 2019-04-01 DIAGNOSIS — C3411 Malignant neoplasm of upper lobe, right bronchus or lung: Secondary | ICD-10-CM | POA: Diagnosis not present

## 2019-04-01 DIAGNOSIS — Z87891 Personal history of nicotine dependence: Secondary | ICD-10-CM | POA: Diagnosis not present

## 2019-04-02 ENCOUNTER — Ambulatory Visit
Admission: RE | Admit: 2019-04-02 | Discharge: 2019-04-02 | Disposition: A | Discharge status: Home / Self Care | Payer: Medicare Other | Source: Ambulatory Visit | Attending: Radiation Oncology | Admitting: Radiation Oncology

## 2019-04-02 ENCOUNTER — Other Ambulatory Visit: Payer: Self-pay

## 2019-04-02 DIAGNOSIS — Z510 Encounter for antineoplastic radiation therapy: Secondary | ICD-10-CM | POA: Diagnosis not present

## 2019-04-02 DIAGNOSIS — Z87891 Personal history of nicotine dependence: Secondary | ICD-10-CM | POA: Diagnosis not present

## 2019-04-02 DIAGNOSIS — C3411 Malignant neoplasm of upper lobe, right bronchus or lung: Secondary | ICD-10-CM | POA: Diagnosis not present

## 2019-04-02 MED ORDER — OXYCODONE-ACETAMINOPHEN 5-325 MG PO TABS: 1.0000 | ORAL_TABLET | Freq: Four times a day (QID) | ORAL | 0 refills | Status: DC | PRN

## 2019-04-06 ENCOUNTER — Telehealth: Payer: Self-pay

## 2019-04-06 ENCOUNTER — Ambulatory Visit
Admission: RE | Admit: 2019-04-06 | Discharge: 2019-04-06 | Disposition: A | Discharge status: Home / Self Care | Payer: Medicare Other | Source: Ambulatory Visit | Attending: Radiation Oncology | Admitting: Radiation Oncology

## 2019-04-06 ENCOUNTER — Other Ambulatory Visit: Payer: Self-pay

## 2019-04-06 DIAGNOSIS — C3411 Malignant neoplasm of upper lobe, right bronchus or lung: Secondary | ICD-10-CM | POA: Diagnosis not present

## 2019-04-06 DIAGNOSIS — Z510 Encounter for antineoplastic radiation therapy: Secondary | ICD-10-CM | POA: Diagnosis not present

## 2019-04-06 DIAGNOSIS — Z87891 Personal history of nicotine dependence: Secondary | ICD-10-CM | POA: Diagnosis not present

## 2019-04-06 DIAGNOSIS — I10 Essential (primary) hypertension: Secondary | ICD-10-CM | POA: Diagnosis not present

## 2019-04-06 NOTE — Telephone Encounter (Signed)
RNCM reaching out to patient after [facility] Call flagged for concerns.  Patient reports that she is not depressed and is doing okay, states " I could be doing better but I could be doing worse".  Patient was discharged on 6/26 and was to resume home health services with [facility].  Patient reports that no one has come out to see her and she is having trouble with her colostomy.  Patient has contacted her PCP and has a telephone appointment next week.  RNCM contacted Sharmon Revere with [facility] and she will look into the case.  RNCM will follow up with Malachy Mood from [facility] to ensure that patient has home health services needed.

## 2019-04-07 ENCOUNTER — Inpatient Hospital Stay: Payer: Medicare Other | Attending: Oncology

## 2019-04-07 ENCOUNTER — Inpatient Hospital Stay (HOSPITAL_BASED_OUTPATIENT_CLINIC_OR_DEPARTMENT_OTHER): Payer: Medicare Other | Admitting: Oncology

## 2019-04-07 ENCOUNTER — Other Ambulatory Visit: Payer: Self-pay

## 2019-04-07 ENCOUNTER — Inpatient Hospital Stay: Payer: Medicare Other

## 2019-04-07 ENCOUNTER — Encounter: Payer: Self-pay | Admitting: Oncology

## 2019-04-07 ENCOUNTER — Ambulatory Visit
Admission: RE | Admit: 2019-04-07 | Discharge: 2019-04-07 | Disposition: A | Discharge status: Home / Self Care | Payer: Medicare Other | Source: Ambulatory Visit | Attending: Radiation Oncology | Admitting: Radiation Oncology

## 2019-04-07 ENCOUNTER — Telehealth: Payer: Self-pay | Admitting: *Deleted

## 2019-04-07 VITALS — BP 163/79 | HR 87 | Temp 96.3°F | Resp 18 | Wt 149.8 lb

## 2019-04-07 DIAGNOSIS — G8929 Other chronic pain: Secondary | ICD-10-CM | POA: Insufficient documentation

## 2019-04-07 DIAGNOSIS — E876 Hypokalemia: Secondary | ICD-10-CM

## 2019-04-07 DIAGNOSIS — Z510 Encounter for antineoplastic radiation therapy: Secondary | ICD-10-CM | POA: Diagnosis not present

## 2019-04-07 DIAGNOSIS — Z5112 Encounter for antineoplastic immunotherapy: Secondary | ICD-10-CM | POA: Insufficient documentation

## 2019-04-07 DIAGNOSIS — R110 Nausea: Secondary | ICD-10-CM | POA: Diagnosis not present

## 2019-04-07 DIAGNOSIS — C3411 Malignant neoplasm of upper lobe, right bronchus or lung: Secondary | ICD-10-CM

## 2019-04-07 DIAGNOSIS — D6481 Anemia due to antineoplastic chemotherapy: Secondary | ICD-10-CM | POA: Insufficient documentation

## 2019-04-07 DIAGNOSIS — Z85038 Personal history of other malignant neoplasm of large intestine: Secondary | ICD-10-CM | POA: Insufficient documentation

## 2019-04-07 DIAGNOSIS — C187 Malignant neoplasm of sigmoid colon: Secondary | ICD-10-CM

## 2019-04-07 DIAGNOSIS — R05 Cough: Secondary | ICD-10-CM | POA: Diagnosis not present

## 2019-04-07 DIAGNOSIS — C3490 Malignant neoplasm of unspecified part of unspecified bronchus or lung: Secondary | ICD-10-CM

## 2019-04-07 DIAGNOSIS — Z5189 Encounter for other specified aftercare: Secondary | ICD-10-CM | POA: Diagnosis not present

## 2019-04-07 DIAGNOSIS — Z5111 Encounter for antineoplastic chemotherapy: Secondary | ICD-10-CM

## 2019-04-07 DIAGNOSIS — Z87891 Personal history of nicotine dependence: Secondary | ICD-10-CM | POA: Diagnosis not present

## 2019-04-07 LAB — CBC WITH DIFFERENTIAL/PLATELET
Abs Immature Granulocytes: 0.25 10*3/uL — ABNORMAL HIGH (ref 0.00–0.07)
Basophils Absolute: 0 10*3/uL (ref 0.0–0.1)
Basophils Relative: 0 %
Eosinophils Absolute: 0 10*3/uL (ref 0.0–0.5)
Eosinophils Relative: 0 %
HCT: 28.8 % — ABNORMAL LOW (ref 36.0–46.0)
Hemoglobin: 9.6 g/dL — ABNORMAL LOW (ref 12.0–15.0)
Immature Granulocytes: 2 %
Lymphocytes Relative: 8 %
Lymphs Abs: 1.1 10*3/uL (ref 0.7–4.0)
MCH: 27.7 pg (ref 26.0–34.0)
MCHC: 33.3 g/dL (ref 30.0–36.0)
MCV: 83.2 fL (ref 80.0–100.0)
Monocytes Absolute: 0.6 10*3/uL (ref 0.1–1.0)
Monocytes Relative: 5 %
Neutro Abs: 11.6 10*3/uL — ABNORMAL HIGH (ref 1.7–7.7)
Neutrophils Relative %: 85 %
Platelets: 405 10*3/uL — ABNORMAL HIGH (ref 150–400)
RBC: 3.46 MIL/uL — ABNORMAL LOW (ref 3.87–5.11)
RDW: 15.8 % — ABNORMAL HIGH (ref 11.5–15.5)
WBC: 13.6 10*3/uL — ABNORMAL HIGH (ref 4.0–10.5)
nRBC: 0.1 % (ref 0.0–0.2)

## 2019-04-07 LAB — COMPREHENSIVE METABOLIC PANEL
ALT: 23 U/L (ref 0–44)
AST: 22 U/L (ref 15–41)
Albumin: 3.7 g/dL (ref 3.5–5.0)
Alkaline Phosphatase: 104 U/L (ref 38–126)
Anion gap: 11 (ref 5–15)
BUN: 10 mg/dL (ref 8–23)
CO2: 22 mmol/L (ref 22–32)
Calcium: 8.8 mg/dL — ABNORMAL LOW (ref 8.9–10.3)
Chloride: 105 mmol/L (ref 98–111)
Creatinine, Ser: 0.65 mg/dL (ref 0.44–1.00)
GFR calc Af Amer: >60 mL/min (ref >60)
GFR calc non Af Amer: >60 mL/min (ref >60)
Glucose, Bld: 131 mg/dL — ABNORMAL HIGH (ref 70–99)
Potassium: 2.8 mmol/L — ABNORMAL LOW (ref 3.5–5.1)
Sodium: 138 mmol/L (ref 135–145)
Total Bilirubin: 0.3 mg/dL (ref 0.3–1.2)
Total Protein: 6.9 g/dL (ref 6.5–8.1)

## 2019-04-07 MED ORDER — SODIUM CHLORIDE 0.9 % IV SOLN
Freq: Once | INTRAVENOUS | Status: AC
  Administered 2019-04-07: 11:00:00 via INTRAVENOUS
  Filled 2019-04-07: qty 1000

## 2019-04-07 MED ORDER — HEPARIN SOD (PORK) LOCK FLUSH 100 UNIT/ML IV SOLN
500.0000 [IU] | Freq: Once | INTRAVENOUS | Status: AC
  Administered 2019-04-07: 500 [IU] via INTRAVENOUS
  Filled 2019-04-07: qty 5

## 2019-04-07 MED ORDER — POTASSIUM CHLORIDE 0.4 MEQ/ML IV SOLN: 20.0000 meq | Freq: Once | INTRAVENOUS | 0 refills | Status: DC

## 2019-04-07 MED ORDER — DEXAMETHASONE SODIUM PHOSPHATE 10 MG/ML IJ SOLN
10.0000 mg | Freq: Once | INTRAMUSCULAR | Status: AC
  Administered 2019-04-07: 13:00:00 10 mg via INTRAVENOUS
  Filled 2019-04-07: qty 1

## 2019-04-07 MED ORDER — SODIUM CHLORIDE 0.9 % IV SOLN
Freq: Once | INTRAVENOUS | Status: AC
  Administered 2019-04-07: 11:00:00 via INTRAVENOUS
  Filled 2019-04-07: qty 250

## 2019-04-07 MED ORDER — POTASSIUM CHLORIDE CRYS ER 20 MEQ PO TBCR: 20.0000 meq | EXTENDED_RELEASE_TABLET | Freq: Every day | ORAL | 0 refills | Status: DC

## 2019-04-07 MED ORDER — PALONOSETRON HCL INJECTION 0.25 MG/5ML
0.2500 mg | Freq: Once | INTRAVENOUS | Status: AC
  Administered 2019-04-07: 0.25 mg via INTRAVENOUS
  Filled 2019-04-07: qty 5

## 2019-04-07 MED ORDER — SODIUM CHLORIDE 0.9 % IV SOLN
400.0000 mg | Freq: Once | INTRAVENOUS | Status: AC
  Administered 2019-04-07: 14:00:00 400 mg via INTRAVENOUS
  Filled 2019-04-07: qty 40

## 2019-04-07 MED ORDER — SODIUM CHLORIDE 0.9 % IV SOLN
100.0000 mg/m2 | Freq: Once | INTRAVENOUS | Status: AC
  Administered 2019-04-07: 13:00:00 180 mg via INTRAVENOUS
  Filled 2019-04-07: qty 9

## 2019-04-07 MED ORDER — SODIUM CHLORIDE 0.9 % IV SOLN
999.0000 mL/h | INTRAVENOUS | Status: DC
  Filled 2019-04-07: qty 250

## 2019-04-07 MED ORDER — SODIUM CHLORIDE 0.9% FLUSH
10.0000 mL | Freq: Once | INTRAVENOUS | Status: AC
  Administered 2019-04-07: 10:00:00 10 mL via INTRAVENOUS
  Filled 2019-04-07: qty 10

## 2019-04-07 NOTE — Progress Notes (Signed)
Hematology/Oncology Consult note [facility]  Telephone:([phone] [phone]  Patient Care Team: Inc, [facility] as PCP - General Telford Nab, [place] as Registered Nurse   Name of the patient: Gloria Allen  [id number]  [birth year]   Date of visit: 04/07/19  Diagnosis-  1.Stage III colon cancer status post surgery 2.New diagnosis of limited stage small cell lung cancer  Chief complaint/ Reason for visit-on treatment assessment prior to cycle 2-day 1 of carboplatin and etoposide  Heme/Onc history: patient is a 73 year old African-American female who recently underwent screening colonoscopy by Dr. Bonna Gains.Colonoscopy showed villous nonobstructing large mass in the sigmoid colon 12 cm proximal to the anus. Mass was partially circumferential. Biopsy showed invasive adenocarcinoma. Patient was seen by Dr. Gretchen Short underwent hemicolectomy with lymph node sampling on 11/27/2018. Final pathology showed invasive colorectal carcinoma 3.5 cm, grade 2. Tumor invades through muscularis propria into the peri-colorectal tissue. Margins negative. Tumor deposits present, 2. 1 out of 9 lymph nodes examined was positive for malignancy. [id number].Patient was feeling poorly after surgery and she had no social support and decided against adjuvant chemotherapy.  Patient underwent CT chest to complete her staging work-up which showed 2 lung nodules one in the right upper lobe and one in the left left upper lobe.This was followed by a PET CT scan which showed with the left upper lobe nodule was not hypermetabolic.The right upper lobe lung nodule measure 1.6 cm with an SUV of 6.8. No hypermetabolic axillary mediastinal or hilar lymphadenopathy or evidence of metastatic disease elsewhere. Patient underwent CT-guided biopsy of the right upper lobe lung nodule which was consistent with small cell lung cancerpositive for CD56 and TTF-1.  Patient started  concurrent chemoradiation in June 2020.  Carboplatin chosen instead of cisplatin with etoposide given her age, poor social support and concern for renal dysfunction.  Interval history-patient reports ongoing low back pain as well as bilateral leg pain since her hospital discharge.  She also reports of leg abdominal pain which comes and goes.  She is having regular bowel movements and denies any constipation or diarrhea  ECOG PS- 1 Pain scale- 0 Opioid associated constipation- no  Review of systems- Review of Systems  Constitutional: Positive for malaise/fatigue. Negative for chills, fever and weight loss.  HENT: Negative for congestion, ear discharge and nosebleeds.   Eyes: Negative for blurred vision.  Respiratory: Negative for cough, hemoptysis, sputum production, shortness of breath and wheezing.   Cardiovascular: Negative for chest pain, palpitations, orthopnea and claudication.  Gastrointestinal: Positive for abdominal pain. Negative for blood in stool, constipation, diarrhea, heartburn, melena, nausea and vomiting.  Genitourinary: Negative for dysuria, flank pain, frequency, hematuria and urgency.  Musculoskeletal: Positive for back pain. Negative for joint pain and myalgias.  Skin: Negative for rash.  Neurological: Negative for dizziness, tingling, focal weakness, seizures, weakness and headaches.  Endo/Heme/Allergies: Does not bruise/bleed easily.  Psychiatric/Behavioral: Negative for depression and suicidal ideas. The patient does not have insomnia.       Allergies  Allergen Reactions  . Ace Inhibitors Swelling  . Angiotensin Receptor Blockers Swelling  . Sulfa Antibiotics Itching  . Chlorthalidone Rash  . Flexeril [Cyclobenzaprine] Rash     Past Medical History:  Diagnosis Date  . Anxiety   . Arthritis   . Asthma   . Cancer ([place])   . Cervical central spinal stenosis (C3-C7) (worse at C4-5) 07/30/2017  . Cervical foraminal stenosis (C4-5 and C5-6) (Bilateral) (L>R)  07/30/2017  . Chronic lower extremity pain (  Fourth Area of Pain) (Bilateral) (R>L) 06/11/2017  . Chronic neck pain (Primary Area of Pain) (Bilateral) (R>L) 06/11/2017  . Chronic sacroiliac joint pain (Bilateral) (R>L) 06/11/2017  . Chronic shoulder pain [facility] Area of Pain) (Right) 06/11/2017  . Chronic upper extremity pain (Fifth Area of Pain) (Bilateral) (R>L) 06/11/2017  . Colon cancer ([place])   . DDD (degenerative disc disease), cervical 07/30/2017  . DDD (degenerative disc disease), lumbar 07/30/2017  . Depression   . DISH (diffuse idiopathic skeletal hyperostosis) 07/30/2017  . Dyspnea    with exertion  . Entrapment syndrome 06/20/2017   2002 on R and 2010 on L  . Full thickness rotator cuff tear 06/12/2017  . GERD (gastroesophageal reflux disease)   . Grade 1 Anterolisthesis of L4 over L5 07/30/2017  . Headache   . Hx: UTI (urinary tract infection)   . Hypertension   . Inflammation of joint of shoulder region 01/15/2017  . Lumbar central spinal stenosis (L4-5) 07/30/2017  . Lumbar disc protrusion (Left: L5-S1) (Right: L1-2) 07/30/2017   L5-S1 left foraminal protrusion with L5 impingement. L1-2 right paracentral protrusion without impingement.  . Lumbar facet arthropathy (Bilateral) 07/30/2017  . Lumbar facet syndrome (Bilateral) (R>L) 07/30/2017  . Lung cancer ([place])   . Osteoarthritis of shoulder (Right) 01/15/2017     Past Surgical History:  Procedure Laterality Date  . ABDOMINAL HYSTERECTOMY    . APPENDECTOMY    . BREAST REDUCTION SURGERY Bilateral   . COLONOSCOPY WITH PROPOFOL N/A 11/05/2018   Procedure: COLONOSCOPY WITH PROPOFOL;  Surgeon: Virgel Manifold, MD;  Location: [facility] ENDOSCOPY;  Service: Endoscopy;  Laterality: N/A;  . COLONOSCOPY WITH PROPOFOL N/A 11/06/2018   Procedure: COLONOSCOPY WITH PROPOFOL;  Surgeon: Virgel Manifold, MD;  Location: [facility] ENDOSCOPY;  Service: Endoscopy;  Laterality: N/A;  . COLOSTOMY    . DILATION AND CURETTAGE OF UTERUS    .  ESOPHAGOGASTRODUODENOSCOPY (EGD) WITH PROPOFOL N/A 11/05/2018   Procedure: ESOPHAGOGASTRODUODENOSCOPY (EGD) WITH PROPOFOL;  Surgeon: Virgel Manifold, MD;  Location: [facility] ENDOSCOPY;  Service: Endoscopy;  Laterality: N/A;  . ESOPHAGOGASTRODUODENOSCOPY (EGD) WITH PROPOFOL N/A 03/27/2019   Procedure: ESOPHAGOGASTRODUODENOSCOPY (EGD) WITH PROPOFOL;  Surgeon: Lucilla Lame, MD;  Location: [facility] ENDOSCOPY;  Service: Endoscopy;  Laterality: N/A;  . ILEOSTOMY Right 11/27/2018   Procedure: ILEOSTOMY;  Surgeon: Jules Husbands, MD;  Location: [facility] ORS;  Service: General;  Laterality: Right;  . LAPAROSCOPIC SIGMOID COLECTOMY N/A 11/27/2018   Procedure: LAPAROSCOPIC SIGMOID COLECTOMY;  Surgeon: Jules Husbands, MD;  Location: [facility] ORS;  Service: General;  Laterality: N/A;  . ORIF ANKLE FRACTURE Right 11/21/2018   Procedure: OPEN REDUCTION INTERNAL FIXATION (ORIF) ANKLE FRACTURE;  Surgeon: Corky Mull, MD;  Location: [facility] ORS;  Service: Orthopedics;  Laterality: Right;  . PORTACATH PLACEMENT N/A 03/12/2019   Procedure: INSERTION PORT-A-CATH;  Surgeon: Jules Husbands, MD;  Location: [facility] ORS;  Service: General;  Laterality: N/A;  . REDUCTION MAMMAPLASTY    . SHOULDER ARTHROSCOPY WITH OPEN ROTATOR CUFF REPAIR AND DISTAL CLAVICLE ACROMINECTOMY Right 03/26/2017   Procedure: right shoulder arthroscopy, arthroscopic subacromial decompression, distal clavicle excision, mini open rotator cuff repair;  Surgeon: Thornton Park, MD;  Location: [facility] ORS;  Service: Orthopedics;  Laterality: Right;    Social History   Socioeconomic History  . Marital status: Divorced    Spouse name: Not on file  . Number of children: 3  . Years of education: Not on file  . Highest education level: 11th grade  Occupational History  . Not on file  Social Needs  . Financial resource strain: Somewhat hard  . Food insecurity    Worry: Sometimes true    Inability: Never true  . Transportation needs    Medical: Yes    Non-medical: No   Tobacco Use  . Smoking status: Former Smoker    Packs/day: 1.00    Types: Cigarettes    Quit date: 2010    Years since quitting: 10.5  . Smokeless tobacco: Never Used  . Tobacco comment: quit over 30 years ago   Substance and Sexual Activity  . Alcohol use: No  . Drug use: Yes    Types: Marijuana  . Sexual activity: Never  Lifestyle  . Physical activity    Days per week: 0 days    Minutes per session: 0 min  . Stress: Not at all  Relationships  . Social Herbalist on phone: Never    Gets together: Never    Attends religious service: More than 4 times per year    Active member of club or organization: No    Attends meetings of clubs or organizations: Never    Relationship status: Divorced  . Intimate partner violence    Fear of current or ex partner: No    Emotionally abused: No    Physically abused: No    Forced sexual activity: No  Other Topics Concern  . 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 She receives disability. She has 3 children and several grandchildren. She lives alone. She drives. Uses a walker and shower chair at baseline.     Family History  Problem Relation Age of Onset  . Hypertension Mother   . Breast cancer Mother 43  . Breast cancer Sister 3     Current Outpatient Medications:  .  albuterol (VENTOLIN HFA) 108 (90 Base) MCG/ACT inhaler, Inhale 2 puffs into the lungs every 6 (six) hours as needed for wheezing or shortness of breath., Disp: 18 g, Rfl: 0 .  amLODipine (NORVASC) 10 MG tablet, Take 1 tablet (10 mg total) by mouth daily., Disp: 30 tablet, Rfl: 0 .  BREO ELLIPTA 200-25 MCG/INH AEPB, Inhale 1 puff into the lungs daily., Disp: 60 each, Rfl: 0 .  clonazePAM (KLONOPIN) 0.5 MG tablet, Take 1 tablet (0.5 mg total) by mouth 3 (three) times daily., Disp: 30 tablet, Rfl: 0 .  cloNIDine (CATAPRES) 0.2 MG tablet, Take 1 tablet (0.2 mg total) by mouth 2 (two) times daily., Disp: 60 tablet, Rfl:  0 .  hydrALAZINE (APRESOLINE) 100 MG tablet, Take 1 tablet (100 mg total) by mouth 3 (three) times daily., Disp: 90 tablet, Rfl: 0 .  hydrOXYzine (ATARAX/VISTARIL) 10 MG tablet, Take 1 tablet (10 mg total) by mouth 3 (three) times daily as needed for itching., Disp: 30 tablet, Rfl: 0 .  loperamide (IMODIUM) 2 MG capsule, Take 2 capsules (4 mg total) by mouth 3 (three) times daily., Disp: 180 capsule, Rfl: 0 .  lovastatin (MEVACOR) 40 MG tablet, Take 1 tablet (40 mg total) by mouth at bedtime., Disp: 30 tablet, Rfl: 0 .  meclizine (ANTIVERT) 12.5 MG tablet, Take 1 tablet (12.5 mg total) by mouth 3 (three) times daily as needed for dizziness., Disp: 30 tablet, Rfl: 0 .  megestrol (MEGACE) 400 MG/10ML suspension, Take 10 mLs (400 mg total) by mouth daily for 30 doses., Disp: 480 mL, Rfl: 2 .  methocarbamol (ROBAXIN) 500 MG tablet, Take 1 tablet (500 mg  total) by mouth at bedtime as needed for muscle spasms., Disp: 30 tablet, Rfl: 0 .  metoprolol succinate (TOPROL-XL) 100 MG 24 hr tablet, Take 1 tablet (100 mg total) by mouth daily. Take with or immediately following a meal., Disp: 30 tablet, Rfl: 0 .  mirtazapine (REMERON) 15 MG tablet, take 1 tablet by mouth at bedtime for APPETITIE, Disp: 30 tablet, Rfl: 0 .  montelukast (SINGULAIR) 10 MG tablet, Take 1 tablet (10 mg total) by mouth at bedtime as needed., Disp: 30 tablet, Rfl: 0 .  nystatin (MYCOSTATIN) 100000 UNIT/ML suspension, Take 5 mLs (500,000 Units total) by mouth 4 (four) times daily., Disp: 60 mL, Rfl: 0 .  OLANZapine (ZYPREXA) 10 MG tablet, Take 1 tablet (10 mg total) by mouth at bedtime., Disp: 30 tablet, Rfl: 0 .  omeprazole (PRILOSEC) 40 MG capsule, Take 1 capsule (40 mg total) by mouth 2 (two) times daily., Disp: 60 capsule, Rfl: 0 .  ondansetron (ZOFRAN) 8 MG tablet, Take 1 tablet (8 mg total) by mouth 2 (two) times daily as needed for refractory nausea / vomiting. Start on day 3 after carboplatin chemo., Disp: 30 tablet, Rfl: 0 .   oxybutynin (DITROPAN) 5 MG tablet, Take 1 tablet (5 mg total) by mouth 2 (two) times daily., Disp: 60 tablet, Rfl: 0 .  oxyCODONE-acetaminophen (PERCOCET/ROXICET) 5-325 MG tablet, Take 1 tablet by mouth every 6 (six) hours as needed for severe pain., Disp: 28 tablet, Rfl: 0 .  Potassium 99 MG TABS, Take 1 tablet (99 mg total) by mouth daily after breakfast., Disp: 30 tablet, Rfl: 0 .  prochlorperazine (COMPAZINE) 10 MG tablet, Take 1 tablet (10 mg total) by mouth every 6 (six) hours as needed (Nausea or vomiting)., Disp: 30 tablet, Rfl: 0 .  sertraline (ZOLOFT) 100 MG tablet, Take 2 tablets (200 mg total) by mouth at bedtime., Disp: 60 tablet, Rfl: 0 .  terazosin (HYTRIN) 1 MG capsule, Take 1 capsule (1 mg total) by mouth at bedtime., Disp: 30 capsule, Rfl: 0 .  traZODone (DESYREL) 50 MG tablet, Take 0.5-1 tablets (25-50 mg total) by mouth at bedtime as needed for sleep., Disp: 30 tablet, Rfl: 0 .  triamcinolone cream (KENALOG) 0.1 %, Apply 1 application topically 2 (two) times daily as needed (itching)., Disp: 15 g, Rfl: 0 .  potassium chloride SA (K-DUR) 20 MEQ tablet, Take 1 tablet (20 mEq total) by mouth daily., Disp: 20 tablet, Rfl: 0 No current facility-administered medications for this visit.   Facility-Administered Medications Ordered in Other Visits:  .  CARBOplatin (PARAPLATIN) 400 mg in sodium chloride 0.9 % 250 mL chemo infusion, 400 mg, Intravenous, Once, Sindy Guadeloupe, MD .  etoposide (VEPESID) 180 mg in sodium chloride 0.9 % 500 mL chemo infusion, 100 mg/m2 (Treatment Plan Recorded), Intravenous, Once, Sindy Guadeloupe, MD, Last Rate: 509 mL/hr at 04/07/19 1254, 180 mg at 04/07/19 1254 .  heparin lock flush 100 unit/mL, 500 Units, Intravenous, Once, Sindy Guadeloupe, MD  Physical exam:  Vitals:   04/07/19 1022  BP: (!) 163/79  Pulse: 87  Resp: 18  Temp: (!) 96.3 F (35.7 C)  TempSrc: Tympanic  SpO2: 98%  Weight: 149 lb 12.8 oz (67.9 kg)   Physical Exam Constitutional:       General: She is not in acute distress.    Comments: Sitting in a wheelchair.  HENT:     Head: Normocephalic and atraumatic.  Eyes:     Pupils: Pupils are equal, round, and reactive to light.  Neck:     Musculoskeletal: Normal range of motion.  Cardiovascular:     Rate and Rhythm: Normal rate and regular rhythm.     Heart sounds: Normal heart sounds.  Pulmonary:     Effort: Pulmonary effort is normal.     Breath sounds: Normal breath sounds.  Abdominal:     General: Bowel sounds are normal.     Palpations: Abdomen is soft.     Comments: Colostomy in place.  Normal stool output noted.  No evidence of infection in the parastomal area  Skin:    General: Skin is warm and dry.  Neurological:     Mental Status: She is alert and oriented to person, place, and time.      CMP Latest Ref Rng & Units 04/07/2019  Glucose 70 - 99 mg/dL 131(H)  BUN 8 - 23 mg/dL 10  Creatinine 0.44 - 1.00 mg/dL 0.65  Sodium 135 - 145 mmol/L 138  Potassium 3.5 - 5.1 mmol/L 2.8(L)  Chloride 98 - 111 mmol/L 105  CO2 22 - 32 mmol/L 22  Calcium 8.9 - 10.3 mg/dL 8.8(L)  Total Protein 6.5 - 8.1 g/dL 6.9  Total Bilirubin 0.3 - 1.2 mg/dL 0.3  Alkaline Phos 38 - 126 U/L 104  AST 15 - 41 U/L 22  ALT 0 - 44 U/L 23   CBC Latest Ref Rng & Units 04/07/2019  WBC 4.0 - 10.5 K/uL 13.6(H)  Hemoglobin 12.0 - 15.0 g/dL 9.6(L)  Hematocrit 36.0 - 46.0 % 28.8(L)  Platelets 150 - 400 K/uL 405(H)    No images are attached to the encounter.  Ct Chest W Contrast  Result Date: 03/24/2019 CLINICAL DATA:  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 Diagnosis May 2020. Currently undergoing chemotherapy and radiation. EXAM: CT CHEST, ABDOMEN, AND PELVIS WITH CONTRAST TECHNIQUE: Multidetector CT imaging of the chest, abdomen and pelvis was performed following the standard protocol during bolus administration of intravenous contrast. CONTRAST:  160mL OMNIPAQUE IOHEXOL 300 MG/ML  SOLN COMPARISON:  PET-CT 02/05/2019  FINDINGS: CT CHEST FINDINGS Cardiovascular: The heart is normal in size. No pericardial effusion. The aorta is normal in caliber. Moderate atherosclerotic calcifications. No dissection. The branch vessels are patent. Stable coronary artery calcifications. Mediastinum/Nodes: No mediastinal or hilar mass or lymphadenopathy. The esophagus is unremarkable. Lungs/Pleura: Stable underlying emphysematous changes. The right upper lobe pulmonary lesion measures 7.5 mm on image number 58 and previously measured 15.5 mm. Semi solid nodular left upper lobe lesion peripherally on image number 85 measures 18 mm and is unchanged. This was not hypermetabolic on the recent PET-CT but recommend continued observation/surveillance. A few tiny scattered subpleural nodules are stable and likely represent lymph nodes. No infiltrates or effusions. Musculoskeletal: No breast masses, supraclavicular or axillary adenopathy. The right-sided Port-A-Cath is stable. The thyroid gland is unremarkable. Stable small right nodule. No significant bony findings. CT ABDOMEN PELVIS FINDINGS Hepatobiliary: Stable low-attenuation lesion in segment 6 most consistent with a benign hepatic cyst. No worrisome hepatic lesions or intrahepatic biliary dilatation. The gallbladder is surgically absent. Mild associated common bile duct dilatation. Pancreas: No mass, inflammation or ductal dilatation. Spleen: Normal size.  No focal lesions. Adrenals/Urinary Tract: The adrenal glands are normal in stable. No renal, ureteral or bladder calculi or mass. Stomach/Bowel: The stomach, duodenum, small bowel and colon are unremarkable. No acute inflammatory changes, mass lesions or obstructive findings. Patient has a loop ileostomy in the right lower quadrant. Prior left hemicolectomy with transverse colon to rectal anastomosis. There is  dense contrast in the rectum with moderate artifact but no findings suspicious for residual or recurrent tumor. Vascular/Lymphatic: Stable  aortic and iliac artery calcifications but no aneurysm. The major venous structures are patent. No mesenteric or retroperitoneal mass or adenopathy. No pelvic lymphadenopathy. Reproductive: The uterus is surgically absent. Both ovaries are still present and appear normal. There is a simple appearing cyst associated with the left ovary. Other: No free pelvic fluid collections or pelvic abscess. No inguinal mass or adenopathy. No subcutaneous lesions. Musculoskeletal: No significant bony findings. IMPRESSION: 1. Interval decrease in size of the right upper lobe pulmonary nodule now measuring 7.5 mm and previously measuring 15.5 mm. 2. Stable 18 mm semi-solid airspace nodule in the left upper lobe. Recommend continued surveillance. 3. No mediastinal or hilar mass or adenopathy. 4. Stable surgical changes involving the colon. No findings suspicious for recurrent tumor, locoregional adenopathy or distant metastatic disease. Electronically Signed   By: Marijo Sanes M.D.   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 Diagnosis May 2020. Currently undergoing chemotherapy and radiation. EXAM: CT CHEST, ABDOMEN, AND PELVIS WITH CONTRAST TECHNIQUE: Multidetector CT imaging of the chest, abdomen and pelvis was performed following the standard protocol during bolus administration of intravenous contrast. CONTRAST:  129mL OMNIPAQUE IOHEXOL 300 MG/ML  SOLN COMPARISON:  PET-CT 02/05/2019 FINDINGS: CT CHEST FINDINGS Cardiovascular: The heart is normal in size. No pericardial effusion. The aorta is normal in caliber. Moderate atherosclerotic calcifications. No dissection. The branch vessels are patent. Stable coronary artery calcifications. Mediastinum/Nodes: No mediastinal or hilar mass or lymphadenopathy. The esophagus is unremarkable. Lungs/Pleura: Stable underlying emphysematous changes. The right upper lobe pulmonary  lesion measures 7.5 mm on image number 58 and previously measured 15.5 mm. Semi solid nodular left upper lobe lesion peripherally on image number 85 measures 18 mm and is unchanged. This was not hypermetabolic on the recent PET-CT but recommend continued observation/surveillance. A few tiny scattered subpleural nodules are stable and likely represent lymph nodes. No infiltrates or effusions. Musculoskeletal: No breast masses, supraclavicular or axillary adenopathy. The right-sided Port-A-Cath is stable. The thyroid gland is unremarkable. Stable small right nodule. No significant bony findings. CT ABDOMEN PELVIS FINDINGS Hepatobiliary: Stable low-attenuation lesion in segment 6 most consistent with a benign hepatic cyst. No worrisome hepatic lesions or intrahepatic biliary dilatation. The gallbladder is surgically absent. Mild associated common bile duct dilatation. Pancreas: No mass, inflammation or ductal dilatation. Spleen: Normal size.  No focal lesions. Adrenals/Urinary Tract: The adrenal glands are normal in stable. No renal, ureteral or bladder calculi or mass. Stomach/Bowel: The stomach, duodenum, small bowel and colon are unremarkable. No acute inflammatory changes, mass lesions or obstructive findings. Patient has a loop ileostomy in the right lower quadrant. Prior left hemicolectomy with transverse colon to rectal anastomosis. There is dense contrast in the rectum with moderate artifact but no findings suspicious for residual or recurrent tumor. Vascular/Lymphatic: Stable aortic and iliac artery calcifications but no aneurysm. The major venous structures are patent. No mesenteric or retroperitoneal mass or adenopathy. No pelvic lymphadenopathy. Reproductive: The uterus is surgically absent. Both ovaries are still present and appear normal. There is a simple appearing cyst associated with the left ovary. Other: No free pelvic fluid collections or pelvic abscess. No inguinal mass or adenopathy. No  subcutaneous lesions. Musculoskeletal: No significant bony findings. IMPRESSION: 1. Interval decrease in size of the right upper lobe pulmonary nodule now  measuring 7.5 mm and previously measuring 15.5 mm. 2. Stable 18 mm semi-solid airspace nodule in the left upper lobe. Recommend continued surveillance. 3. No mediastinal or hilar mass or adenopathy. 4. Stable surgical changes involving the colon. No findings suspicious for recurrent tumor, locoregional adenopathy or distant metastatic disease. Electronically Signed   By: Marijo Sanes M.D.   On: 03/24/2019 15:26   Dg Chest Port 1 View  Result Date: 03/12/2019 CLINICAL DATA:  Port-A-Cath placement. EXAM: PORTABLE CHEST 1 VIEW COMPARISON:  Chest x-ray 02/12/2019 FINDINGS: Right IJ power port tip is at the level of the carina in the mid SVC. No complicating features. The cardiac silhouette, mediastinal and hilar contours are stable. The lungs are clear. No pleural effusion. IMPRESSION: Right IJ power port in good position with the tip in the mid SVC. No complicating features. Electronically Signed   By: Marijo Sanes M.D.   On: 03/12/2019 14:26   Dg C-arm 1-60 Min-no Report  Result Date: 03/12/2019 Fluoroscopy was utilized by the requesting physician.  No radiographic interpretation.     Assessment and plan- Patient is a 73 y.o. female with history of stage III colon cancer status post resection(did not receive adjuvant chemotherapy).Then had new diagnosis of limited stage small cell lung cancer.  She is here for on treatment assessment prior to cycle 2-day 1 of carboplatin and etoposide chemotherapy  1.  Counts okay to proceed with cycle 2-day 1 of carboplatin and etoposide chemotherapy today.  She will receive etoposide on day 2 and day 3.  I have not switched her to cisplatin as I am concerned that her oral fluid intake is not consistent and patient lives alone and has poor social support.  She did have one episode of hospitalization for abdominal  pain after her first cycle of chemotherapy.  2.  1 L of IV fluids today and I will see her back in 10 days with a repeat CMP for possible IV fluids  3.  Hypokalemia: We will give her 20 mEq of IV potassium today and I will send her home with oral potassium 20 mEq for next 3 weeks.  4.  Patient continues to have vague abdominal pain the etiology of which is unclear.  She did have a recent CT abdomen which did not reveal any acute pathology.  Patient reports problems with her parastomal area and feels like it is becoming darker in color.  She also has stool abdominal staples in place which are yet to be removed.  I will refer her back to Dr. Dahlia Byes for these complaints.  5.  Due to ongoing chemotherapy and recent hospitalization patient is deconditioned and confined to one room without access to a bathroom and therefore needs commode due to limitations in her mobility.   Visit Diagnosis 1. Malignant neoplasm of sigmoid colon ([place])   2. Encounter for antineoplastic chemotherapy   3. Hypokalemia      Dr. Randa Evens, MD, MPH [facility] [phone] 04/07/2019 1:53 PM

## 2019-04-07 NOTE — Telephone Encounter (Signed)
That was an error. I will switch to PO K

## 2019-04-07 NOTE — Progress Notes (Signed)
Pt in for follow up, reports having cough and congestion.

## 2019-04-08 ENCOUNTER — Ambulatory Visit: Payer: Medicare Other

## 2019-04-08 ENCOUNTER — Inpatient Hospital Stay: Payer: Medicare Other

## 2019-04-08 ENCOUNTER — Ambulatory Visit
Admission: RE | Admit: 2019-04-08 | Discharge: 2019-04-08 | Disposition: A | Discharge status: Home / Self Care | Payer: Medicare Other | Source: Ambulatory Visit | Attending: Radiation Oncology | Admitting: Radiation Oncology

## 2019-04-08 ENCOUNTER — Other Ambulatory Visit: Payer: Self-pay

## 2019-04-08 ENCOUNTER — Ambulatory Visit (INDEPENDENT_AMBULATORY_CARE_PROVIDER_SITE_OTHER): Payer: Medicare Other | Admitting: Surgery

## 2019-04-08 ENCOUNTER — Encounter: Payer: Self-pay | Admitting: Surgery

## 2019-04-08 VITALS — BP 140/63 | HR 80 | Temp 97.2°F | Resp 20

## 2019-04-08 VITALS — BP 156/74 | HR 103 | Temp 97.7°F | Ht 65.0 in | Wt 155.0 lb

## 2019-04-08 DIAGNOSIS — Z5189 Encounter for other specified aftercare: Secondary | ICD-10-CM | POA: Diagnosis not present

## 2019-04-08 DIAGNOSIS — G8929 Other chronic pain: Secondary | ICD-10-CM | POA: Diagnosis not present

## 2019-04-08 DIAGNOSIS — C3490 Malignant neoplasm of unspecified part of unspecified bronchus or lung: Secondary | ICD-10-CM

## 2019-04-08 DIAGNOSIS — Z87891 Personal history of nicotine dependence: Secondary | ICD-10-CM | POA: Diagnosis not present

## 2019-04-08 DIAGNOSIS — E876 Hypokalemia: Secondary | ICD-10-CM | POA: Diagnosis not present

## 2019-04-08 DIAGNOSIS — C187 Malignant neoplasm of sigmoid colon: Secondary | ICD-10-CM | POA: Diagnosis not present

## 2019-04-08 DIAGNOSIS — C3411 Malignant neoplasm of upper lobe, right bronchus or lung: Secondary | ICD-10-CM | POA: Diagnosis not present

## 2019-04-08 DIAGNOSIS — Z5112 Encounter for antineoplastic immunotherapy: Secondary | ICD-10-CM | POA: Diagnosis not present

## 2019-04-08 DIAGNOSIS — D6481 Anemia due to antineoplastic chemotherapy: Secondary | ICD-10-CM | POA: Diagnosis not present

## 2019-04-08 DIAGNOSIS — R110 Nausea: Secondary | ICD-10-CM | POA: Diagnosis not present

## 2019-04-08 DIAGNOSIS — R05 Cough: Secondary | ICD-10-CM | POA: Diagnosis not present

## 2019-04-08 DIAGNOSIS — Z510 Encounter for antineoplastic radiation therapy: Secondary | ICD-10-CM | POA: Diagnosis not present

## 2019-04-08 DIAGNOSIS — Z85038 Personal history of other malignant neoplasm of large intestine: Secondary | ICD-10-CM | POA: Diagnosis not present

## 2019-04-08 MED ORDER — HEPARIN SOD (PORK) LOCK FLUSH 100 UNIT/ML IV SOLN
500.0000 [IU] | Freq: Once | INTRAVENOUS | Status: AC | PRN
  Administered 2019-04-08: 12:00:00 500 [IU]
  Filled 2019-04-08: qty 5

## 2019-04-08 MED ORDER — SODIUM CHLORIDE 0.9% FLUSH
10.0000 mL | INTRAVENOUS | Status: DC | PRN
  Administered 2019-04-08: 10:00:00 10 mL
  Filled 2019-04-08: qty 10

## 2019-04-08 MED ORDER — IBUPROFEN 800 MG PO TABS: 800.0000 mg | ORAL_TABLET | Freq: Three times a day (TID) | ORAL | 0 refills | Status: DC | PRN

## 2019-04-08 MED ORDER — DEXAMETHASONE SODIUM PHOSPHATE 10 MG/ML IJ SOLN
10.0000 mg | Freq: Once | INTRAMUSCULAR | Status: AC
  Administered 2019-04-08: 11:00:00 10 mg via INTRAVENOUS
  Filled 2019-04-08: qty 1

## 2019-04-08 MED ORDER — SODIUM CHLORIDE 0.9 % IV SOLN
Freq: Once | INTRAVENOUS | Status: AC
  Administered 2019-04-08: 10:00:00 via INTRAVENOUS
  Filled 2019-04-08: qty 250

## 2019-04-08 MED ORDER — SODIUM CHLORIDE 0.9 % IV SOLN
100.0000 mg/m2 | Freq: Once | INTRAVENOUS | Status: AC
  Administered 2019-04-08: 11:00:00 180 mg via INTRAVENOUS
  Filled 2019-04-08: qty 9

## 2019-04-08 MED ORDER — TIZANIDINE HCL 2 MG PO TABS: 2.0000 mg | ORAL_TABLET | Freq: Four times a day (QID) | ORAL | 0 refills | Status: DC | PRN

## 2019-04-08 NOTE — Patient Instructions (Signed)
The patient is aware to call back for any questions or new concerns.  

## 2019-04-08 NOTE — Progress Notes (Signed)
Outpatient Surgical Follow Up  04/08/2019  CHARMAGNE BUHL is an 73 y.o. female.   Chief Complaint  Patient presents with  . Follow-up    HPI: 73 year old female well-known to me with a prior history of low anterior resection with diverting loop ileostomy for cancer. 11/27/2018.  She presents with right-sided abdominal pain.  She had a recent CT scan that I have personally reviewed showing no evidence of new metastatic disease and no evidence of abscess infection or parastomal hernia.  Her ileostomy is working well.  SHe is taking p.o. No fevers or chills.  She does feel significant weakness secondary to chemotherapy and radiation therapy.   Past Medical History:  Diagnosis Date  . Anxiety   . Arthritis   . Asthma   . Cancer ([place])   . Cervical central spinal stenosis (C3-C7) (worse at C4-5) 07/30/2017  . Cervical foraminal stenosis (C4-5 and C5-6) (Bilateral) (L>R) 07/30/2017  . Chronic lower extremity pain (Fourth Area of Pain) (Bilateral) (R>L) 06/11/2017  . Chronic neck pain (Primary Area of Pain) (Bilateral) (R>L) 06/11/2017  . Chronic sacroiliac joint pain (Bilateral) (R>L) 06/11/2017  . Chronic shoulder pain [facility] Area of Pain) (Right) 06/11/2017  . Chronic upper extremity pain (Fifth Area of Pain) (Bilateral) (R>L) 06/11/2017  . Colon cancer ([place])   . DDD (degenerative disc disease), cervical 07/30/2017  . DDD (degenerative disc disease), lumbar 07/30/2017  . Depression   . DISH (diffuse idiopathic skeletal hyperostosis) 07/30/2017  . Dyspnea    with exertion  . Entrapment syndrome 06/20/2017   2002 on R and 2010 on L  . Full thickness rotator cuff tear 06/12/2017  . GERD (gastroesophageal reflux disease)   . Grade 1 Anterolisthesis of L4 over L5 07/30/2017  . Headache   . Hx: UTI (urinary tract infection)   . Hypertension   . Inflammation of joint of shoulder region 01/15/2017  . Lumbar central spinal stenosis (L4-5) 07/30/2017  . Lumbar disc protrusion (Left: L5-S1)  (Right: L1-2) 07/30/2017   L5-S1 left foraminal protrusion with L5 impingement. L1-2 right paracentral protrusion without impingement.  . Lumbar facet arthropathy (Bilateral) 07/30/2017  . Lumbar facet syndrome (Bilateral) (R>L) 07/30/2017  . Lung cancer ([place])   . Osteoarthritis of shoulder (Right) 01/15/2017    Past Surgical History:  Procedure Laterality Date  . ABDOMINAL HYSTERECTOMY    . APPENDECTOMY    . BREAST REDUCTION SURGERY Bilateral   . COLONOSCOPY WITH PROPOFOL N/A 11/05/2018   Procedure: COLONOSCOPY WITH PROPOFOL;  Surgeon: Virgel Manifold, MD;  Location: [facility] ENDOSCOPY;  Service: Endoscopy;  Laterality: N/A;  . COLONOSCOPY WITH PROPOFOL N/A 11/06/2018   Procedure: COLONOSCOPY WITH PROPOFOL;  Surgeon: Virgel Manifold, MD;  Location: [facility] ENDOSCOPY;  Service: Endoscopy;  Laterality: N/A;  . COLOSTOMY    . DILATION AND CURETTAGE OF UTERUS    . ESOPHAGOGASTRODUODENOSCOPY (EGD) WITH PROPOFOL N/A 11/05/2018   Procedure: ESOPHAGOGASTRODUODENOSCOPY (EGD) WITH PROPOFOL;  Surgeon: Virgel Manifold, MD;  Location: [facility] ENDOSCOPY;  Service: Endoscopy;  Laterality: N/A;  . ESOPHAGOGASTRODUODENOSCOPY (EGD) WITH PROPOFOL N/A 03/27/2019   Procedure: ESOPHAGOGASTRODUODENOSCOPY (EGD) WITH PROPOFOL;  Surgeon: Lucilla Lame, MD;  Location: [facility] ENDOSCOPY;  Service: Endoscopy;  Laterality: N/A;  . ILEOSTOMY Right 11/27/2018   Procedure: ILEOSTOMY;  Surgeon: Jules Husbands, MD;  Location: [facility] ORS;  Service: General;  Laterality: Right;  . LAPAROSCOPIC SIGMOID COLECTOMY N/A 11/27/2018   Procedure: LAPAROSCOPIC SIGMOID COLECTOMY;  Surgeon: Jules Husbands, MD;  Location: [facility] ORS;  Service: General;  Laterality: N/A;  .  ORIF ANKLE FRACTURE Right 11/21/2018   Procedure: OPEN REDUCTION INTERNAL FIXATION (ORIF) ANKLE FRACTURE;  Surgeon: Corky Mull, MD;  Location: [facility] ORS;  Service: Orthopedics;  Laterality: Right;  . PORTACATH PLACEMENT N/A 03/12/2019   Procedure: INSERTION PORT-A-CATH;   Surgeon: Jules Husbands, MD;  Location: [facility] ORS;  Service: General;  Laterality: N/A;  . REDUCTION MAMMAPLASTY    . SHOULDER ARTHROSCOPY WITH OPEN ROTATOR CUFF REPAIR AND DISTAL CLAVICLE ACROMINECTOMY Right 03/26/2017   Procedure: right shoulder arthroscopy, arthroscopic subacromial decompression, distal clavicle excision, mini open rotator cuff repair;  Surgeon: Thornton Park, MD;  Location: [facility] ORS;  Service: Orthopedics;  Laterality: Right;    Family History  Problem Relation Age of Onset  . Hypertension Mother   . Breast cancer Mother 73  . Breast cancer Sister 81    Social History:  reports that she quit smoking about 10 years ago. Her smoking use included cigarettes. She smoked 1.00 pack per day. She has never used smokeless tobacco. She reports current drug use. Drug: Marijuana. She reports that she does not drink alcohol.  Allergies:  Allergies  Allergen Reactions  . Ace Inhibitors Swelling  . Angiotensin Receptor Blockers Swelling  . Sulfa Antibiotics Itching  . Chlorthalidone Rash  . Flexeril [Cyclobenzaprine] Rash    Medications reviewed.    ROS Full ROS performed and is otherwise negative other than what is stated in HPI   BP (!) 156/74   Pulse (!) 103   Temp 97.7 F (36.5 C) (Temporal)   Ht 5\' 5"  (1.651 m)   Wt 155 lb (70.3 kg)   SpO2 98%   BMI 25.79 kg/m   Physical Exam Vitals signs and nursing note reviewed. Exam conducted with a chaperone present.  Eyes:     General:        Right eye: No discharge.        Left eye: No discharge.  Neck:     Musculoskeletal: Normal range of motion and neck supple.  Cardiovascular:     Rate and Rhythm: Normal rate.     Heart sounds: No murmur. No gallop.   Pulmonary:     Effort: Pulmonary effort is normal. No respiratory distress.     Breath sounds: Normal breath sounds. No stridor.  Abdominal:     General: Abdomen is flat. There is no distension.     Palpations: There is no mass.     Tenderness: There is  no abdominal tenderness. There is no guarding.     Comments: Lower quadrant ileostomy healing well.  There was a couple of staples that I removed.  No infection no peritonitis.  Mild tenderness to palpation in the right flank  Skin:    General: Skin is warm and dry.     Capillary Refill: Capillary refill takes less than 2 seconds.  Neurological:     General: No focal deficit present.     Mental Status: She is oriented to person, place, and time.  Psychiatric:        Mood and Affect: Mood normal.        Behavior: Behavior normal.        Thought Content: Thought content normal.        Judgment: Judgment normal.        Results for orders placed or performed in visit on 04/07/19 (from the past 48 hour(s))  Comprehensive metabolic panel     Status: Abnormal   Collection Time: 04/07/19  9:54 AM  Result Value Ref  Range   Sodium 138 135 - 145 mmol/L   Potassium 2.8 (L) 3.5 - 5.1 mmol/L   Chloride 105 98 - 111 mmol/L   CO2 22 22 - 32 mmol/L   Glucose, Bld 131 (H) 70 - 99 mg/dL   BUN 10 8 - 23 mg/dL   Creatinine, Ser 0.65 0.44 - 1.00 mg/dL   Calcium 8.8 (L) 8.9 - 10.3 mg/dL   Total Protein 6.9 6.5 - 8.1 g/dL   Albumin 3.7 3.5 - 5.0 g/dL   AST 22 15 - 41 U/L   ALT 23 0 - 44 U/L   Alkaline Phosphatase 104 38 - 126 U/L   Total Bilirubin 0.3 0.3 - 1.2 mg/dL   GFR calc non Af Amer >60 >60 mL/min   GFR calc Af Amer >60 >60 mL/min   Anion gap 11 5 - 15    Comment: Performed at [facility] [address] [place], [place] [address]  CBC with Differential/Platelet     Status: Abnormal   Collection Time: 04/07/19  9:54 AM  Result Value Ref Range   WBC 13.6 (H) 4.0 - 10.5 K/uL   RBC 3.46 (L) 3.87 - 5.11 MIL/uL   Hemoglobin 9.6 (L) 12.0 - 15.0 g/dL   HCT 28.8 (L) 36.0 - 46.0 %   MCV 83.2 80.0 - 100.0 fL   MCH 27.7 26.0 - 34.0 pg   MCHC 33.3 30.0 - 36.0 g/dL   RDW 15.8 (H) 11.5 - 15.5 %   Platelets 405 (H) 150 - 400 K/uL   nRBC 0.1 0.0 - 0.2 %   Neutrophils Relative % 85 %    Neutro Abs 11.6 (H) 1.7 - 7.7 K/uL   Lymphocytes Relative 8 %   Lymphs Abs 1.1 0.7 - 4.0 K/uL   Monocytes Relative 5 %   Monocytes Absolute 0.6 0.1 - 1.0 K/uL   Eosinophils Relative 0 %   Eosinophils Absolute 0.0 0.0 - 0.5 K/uL   Basophils Relative 0 %   Basophils Absolute 0.0 0.0 - 0.1 K/uL   Immature Granulocytes 2 %   Abs Immature Granulocytes 0.25 (H) 0.00 - 0.07 K/uL    Comment: Performed at [facility], [address] [place], [place] [address]   No results found.  Assessment/Plan: 73 year old female with right flank pain likely related to some muscle spasm.  There is no evidence of intra-abdominal pathology.  No EKG for surgery at this time.  She will likely need a colonoscopy before ileostomy takedown.  I have discussed this with her but will have to wait until she completes the chemotherapy and radiation so she is strong enough to undergo a second major surgery.  She understands and agrees  Greater than 50% of the 25 minutes  visit was spent in counseling/coordination of care   Caroleen Hamman, MD [place] Surgeon

## 2019-04-09 ENCOUNTER — Ambulatory Visit
Admission: RE | Admit: 2019-04-09 | Discharge: 2019-04-09 | Disposition: A | Discharge status: Home / Self Care | Payer: Medicare Other | Source: Ambulatory Visit | Attending: Radiation Oncology | Admitting: Radiation Oncology

## 2019-04-09 ENCOUNTER — Ambulatory Visit: Payer: Medicare Other

## 2019-04-09 ENCOUNTER — Other Ambulatory Visit: Payer: Self-pay

## 2019-04-09 ENCOUNTER — Inpatient Hospital Stay: Payer: Medicare Other

## 2019-04-09 VITALS — BP 135/78 | HR 78 | Temp 96.8°F

## 2019-04-09 DIAGNOSIS — R05 Cough: Secondary | ICD-10-CM | POA: Diagnosis not present

## 2019-04-09 DIAGNOSIS — D6481 Anemia due to antineoplastic chemotherapy: Secondary | ICD-10-CM | POA: Diagnosis not present

## 2019-04-09 DIAGNOSIS — G8929 Other chronic pain: Secondary | ICD-10-CM | POA: Diagnosis not present

## 2019-04-09 DIAGNOSIS — Z510 Encounter for antineoplastic radiation therapy: Secondary | ICD-10-CM | POA: Diagnosis not present

## 2019-04-09 DIAGNOSIS — E876 Hypokalemia: Secondary | ICD-10-CM | POA: Diagnosis not present

## 2019-04-09 DIAGNOSIS — Z5112 Encounter for antineoplastic immunotherapy: Secondary | ICD-10-CM | POA: Diagnosis not present

## 2019-04-09 DIAGNOSIS — Z87891 Personal history of nicotine dependence: Secondary | ICD-10-CM | POA: Diagnosis not present

## 2019-04-09 DIAGNOSIS — Z5189 Encounter for other specified aftercare: Secondary | ICD-10-CM | POA: Diagnosis not present

## 2019-04-09 DIAGNOSIS — Z85038 Personal history of other malignant neoplasm of large intestine: Secondary | ICD-10-CM | POA: Diagnosis not present

## 2019-04-09 DIAGNOSIS — C3411 Malignant neoplasm of upper lobe, right bronchus or lung: Secondary | ICD-10-CM | POA: Diagnosis not present

## 2019-04-09 DIAGNOSIS — R110 Nausea: Secondary | ICD-10-CM | POA: Diagnosis not present

## 2019-04-09 DIAGNOSIS — C3490 Malignant neoplasm of unspecified part of unspecified bronchus or lung: Secondary | ICD-10-CM

## 2019-04-09 MED ORDER — DEXAMETHASONE SODIUM PHOSPHATE 10 MG/ML IJ SOLN
10.0000 mg | Freq: Once | INTRAMUSCULAR | Status: AC
  Administered 2019-04-09: 12:00:00 10 mg via INTRAVENOUS
  Filled 2019-04-09: qty 1

## 2019-04-09 MED ORDER — SODIUM CHLORIDE 0.9 % IV SOLN
100.0000 mg/m2 | Freq: Once | INTRAVENOUS | Status: AC
  Administered 2019-04-09: 12:00:00 180 mg via INTRAVENOUS
  Filled 2019-04-09: qty 9

## 2019-04-09 MED ORDER — SODIUM CHLORIDE 0.9 % IV SOLN
Freq: Once | INTRAVENOUS | Status: AC
  Administered 2019-04-09: 12:00:00 via INTRAVENOUS
  Filled 2019-04-09: qty 250

## 2019-04-09 MED ORDER — HEPARIN SOD (PORK) LOCK FLUSH 100 UNIT/ML IV SOLN
500.0000 [IU] | Freq: Once | INTRAVENOUS | Status: DC | PRN
  Filled 2019-04-09: qty 5

## 2019-04-10 ENCOUNTER — Ambulatory Visit
Admission: RE | Admit: 2019-04-10 | Discharge: 2019-04-10 | Disposition: A | Discharge status: Home / Self Care | Payer: Medicare Other | Source: Ambulatory Visit | Attending: Radiation Oncology | Admitting: Radiation Oncology

## 2019-04-10 ENCOUNTER — Other Ambulatory Visit: Payer: Self-pay

## 2019-04-10 DIAGNOSIS — Z510 Encounter for antineoplastic radiation therapy: Secondary | ICD-10-CM | POA: Diagnosis not present

## 2019-04-10 DIAGNOSIS — C3411 Malignant neoplasm of upper lobe, right bronchus or lung: Secondary | ICD-10-CM | POA: Diagnosis not present

## 2019-04-10 DIAGNOSIS — Z87891 Personal history of nicotine dependence: Secondary | ICD-10-CM | POA: Diagnosis not present

## 2019-04-13 ENCOUNTER — Inpatient Hospital Stay (HOSPITAL_BASED_OUTPATIENT_CLINIC_OR_DEPARTMENT_OTHER): Payer: Medicare Other | Admitting: Nurse Practitioner

## 2019-04-13 ENCOUNTER — Inpatient Hospital Stay: Payer: Medicare Other

## 2019-04-13 ENCOUNTER — Other Ambulatory Visit: Payer: Self-pay

## 2019-04-13 ENCOUNTER — Ambulatory Visit
Admission: RE | Admit: 2019-04-13 | Discharge: 2019-04-13 | Disposition: A | Discharge status: Home / Self Care | Payer: Medicare Other | Source: Ambulatory Visit | Attending: Radiation Oncology | Admitting: Radiation Oncology

## 2019-04-13 VITALS — BP 112/76 | HR 88 | Temp 97.7°F | Resp 18

## 2019-04-13 DIAGNOSIS — Z85038 Personal history of other malignant neoplasm of large intestine: Secondary | ICD-10-CM | POA: Diagnosis not present

## 2019-04-13 DIAGNOSIS — C3411 Malignant neoplasm of upper lobe, right bronchus or lung: Secondary | ICD-10-CM | POA: Diagnosis not present

## 2019-04-13 DIAGNOSIS — Z510 Encounter for antineoplastic radiation therapy: Secondary | ICD-10-CM | POA: Diagnosis not present

## 2019-04-13 DIAGNOSIS — C3490 Malignant neoplasm of unspecified part of unspecified bronchus or lung: Secondary | ICD-10-CM

## 2019-04-13 DIAGNOSIS — E860 Dehydration: Secondary | ICD-10-CM

## 2019-04-13 DIAGNOSIS — E876 Hypokalemia: Secondary | ICD-10-CM | POA: Diagnosis not present

## 2019-04-13 DIAGNOSIS — Z87891 Personal history of nicotine dependence: Secondary | ICD-10-CM | POA: Diagnosis not present

## 2019-04-13 DIAGNOSIS — R05 Cough: Secondary | ICD-10-CM | POA: Diagnosis not present

## 2019-04-13 DIAGNOSIS — T451X5A Adverse effect of antineoplastic and immunosuppressive drugs, initial encounter: Secondary | ICD-10-CM

## 2019-04-13 DIAGNOSIS — Z5112 Encounter for antineoplastic immunotherapy: Secondary | ICD-10-CM | POA: Diagnosis not present

## 2019-04-13 DIAGNOSIS — D6481 Anemia due to antineoplastic chemotherapy: Secondary | ICD-10-CM | POA: Diagnosis not present

## 2019-04-13 DIAGNOSIS — Z5189 Encounter for other specified aftercare: Secondary | ICD-10-CM | POA: Diagnosis not present

## 2019-04-13 DIAGNOSIS — G8929 Other chronic pain: Secondary | ICD-10-CM | POA: Diagnosis not present

## 2019-04-13 DIAGNOSIS — Z95828 Presence of other vascular implants and grafts: Secondary | ICD-10-CM

## 2019-04-13 DIAGNOSIS — R110 Nausea: Secondary | ICD-10-CM

## 2019-04-13 LAB — COMPREHENSIVE METABOLIC PANEL
ALT: 20 U/L (ref 0–44)
AST: 19 U/L (ref 15–41)
Albumin: 3.7 g/dL (ref 3.5–5.0)
Alkaline Phosphatase: 90 U/L (ref 38–126)
Anion gap: 6 (ref 5–15)
BUN: 18 mg/dL (ref 8–23)
CO2: 22 mmol/L (ref 22–32)
Calcium: 8.8 mg/dL — ABNORMAL LOW (ref 8.9–10.3)
Chloride: 109 mmol/L (ref 98–111)
Creatinine, Ser: 0.87 mg/dL (ref 0.44–1.00)
GFR calc Af Amer: >60 mL/min (ref >60)
GFR calc non Af Amer: >60 mL/min (ref >60)
Glucose, Bld: 160 mg/dL — ABNORMAL HIGH (ref 70–99)
Potassium: 3.2 mmol/L — ABNORMAL LOW (ref 3.5–5.1)
Sodium: 137 mmol/L (ref 135–145)
Total Bilirubin: 0.5 mg/dL (ref 0.3–1.2)
Total Protein: 7 g/dL (ref 6.5–8.1)

## 2019-04-13 LAB — CBC WITH DIFFERENTIAL/PLATELET
Abs Immature Granulocytes: 0.03 10*3/uL (ref 0.00–0.07)
Basophils Absolute: 0 10*3/uL (ref 0.0–0.1)
Basophils Relative: 1 %
Eosinophils Absolute: 0 10*3/uL (ref 0.0–0.5)
Eosinophils Relative: 1 %
HCT: 25.9 % — ABNORMAL LOW (ref 36.0–46.0)
Hemoglobin: 8.6 g/dL — ABNORMAL LOW (ref 12.0–15.0)
Immature Granulocytes: 1 %
Lymphocytes Relative: 15 %
Lymphs Abs: 0.6 10*3/uL — ABNORMAL LOW (ref 0.7–4.0)
MCH: 28.1 pg (ref 26.0–34.0)
MCHC: 33.2 g/dL (ref 30.0–36.0)
MCV: 84.6 fL (ref 80.0–100.0)
Monocytes Absolute: 0 10*3/uL — ABNORMAL LOW (ref 0.1–1.0)
Monocytes Relative: 1 %
Neutro Abs: 3.5 10*3/uL (ref 1.7–7.7)
Neutrophils Relative %: 81 %
Platelets: 409 10*3/uL — ABNORMAL HIGH (ref 150–400)
RBC: 3.06 MIL/uL — ABNORMAL LOW (ref 3.87–5.11)
RDW: 15.4 % (ref 11.5–15.5)
Smear Review: NORMAL
WBC: 4.2 10*3/uL (ref 4.0–10.5)
nRBC: 0 % (ref 0.0–0.2)

## 2019-04-13 MED ORDER — SODIUM CHLORIDE 0.9% FLUSH
10.0000 mL | INTRAVENOUS | Status: AC | PRN
  Administered 2019-04-13: 10 mL via INTRAVENOUS
  Filled 2019-04-13: qty 10

## 2019-04-13 MED ORDER — HEPARIN SOD (PORK) LOCK FLUSH 100 UNIT/ML IV SOLN
500.0000 [IU] | Freq: Once | INTRAVENOUS | Status: AC
  Administered 2019-04-13: 500 [IU] via INTRAVENOUS

## 2019-04-13 MED ORDER — SODIUM CHLORIDE 0.9 % IV SOLN
Freq: Once | INTRAVENOUS | Status: AC
  Administered 2019-04-13: 15:00:00 via INTRAVENOUS
  Filled 2019-04-13: qty 250

## 2019-04-13 NOTE — Progress Notes (Signed)
Symptom Management [name]  [phone] [phone]  Patient Care Team: Inc, [facility] as PCP - General [place] Nab, [place] as Registered Nurse   Name of the patient: Gloria Allen  [id number]  Jun 15, 1946   Date of visit: 04/13/19  Diagnosis-small cell lung cancer and colon cancer  Chief complaint/ Reason for visit-nausea  Heme/Onc history:  Oncology History Overview Note  Patient initially presented a 73 year old African-American female who underwent screening colonoscopy by Dr. Bonna Gains.  Colonoscopy showed villous nonobstructing large mass in the sigmoid colon 12 cm proximal to the anus.  This was partially circumferential.  Biopsy showed invasive adenocarcinoma.  Patient was seen by Dr. Dahlia Byes and underwent hemicolectomy with lymph node sampling on 11/27/2018.  Final pathology showed invasive colorectal carcinoma 3.5 cm, grade 2.  Tumor invades through musculoskeletal propria into the peri-colorectal tissue.  Margins negative.  Tumor deposits present, 2.  1 out of 9 lymph nodes examined were positive for malignancy. pT3pN1a  Patient lives alone and has no social support.  Children live around [place] but are not able to take care of her.  Initially she reported fatigue trouble mobilizing around house.  She has fracture of right ankle which limits her mobility and she has chronic pain.  She has colostomy in place.  She underwent CT chest to complete her staging work-up which showed 2 lung nodules, one in the right upper lobe, and one in the left upper lobe this was followed by PET scan which showed the left upper lobe was not hypermetabolic.  The right upper lobe lung nodule measuring 1.6 with an SUV of 6.8.  No hypermetabolic axillary, mediastinal, or hilar lymphadenopathy or evidence of metastatic disease elsewhere.  Patient underwent CT-guided biopsy of the right upper lobe lung nodule which was consistent with small  cell lung cancer positive for CD56 and TTF-1.  She initiated [place] and etoposide on 03/17/2019.   Malignant neoplasm of colon ([place])  12/19/2018 Initial Diagnosis   Malignant neoplasm of colon ([place])   12/19/2018 Cancer Staging   Staging form: Colon and Rectum, AJCC 8th Edition - Pathologic stage from 12/19/2018: Stage IIIB (pT3, pN1a, cM0) - Signed by Sindy Guadeloupe, MD on 12/22/2018   Small cell lung cancer ([place])  02/24/2019 Initial Diagnosis   Small cell lung cancer ([place])   02/24/2019 Cancer Staging   Staging form: Lung, AJCC 8th Edition - Clinical stage from 02/24/2019: cT1, cN0, cM0 - Signed by Sindy Guadeloupe, MD on 02/24/2019   03/17/2019 -  Chemotherapy   The patient had palonosetron (ALOXI) injection 0.25 mg, 0.25 mg, Intravenous,  Once, 2 of 4 cycles Administration: 0.25 mg (03/17/2019), 0.25 mg (04/07/2019) pegfilgrastim (NEULASTA ONPRO KIT) injection 6 mg, 6 mg, Subcutaneous, Once, 1 of 1 cycle Administration: 6 mg (03/19/2019) CARBOplatin (PARAPLATIN) 400 mg in sodium chloride 0.9 % 250 mL chemo infusion, 400 mg (100 % of original dose 404 mg), Intravenous,  Once, 2 of 4 cycles Dose modification:   (original dose 404 mg, Cycle 1) Administration: 400 mg (03/17/2019), 400 mg (04/07/2019) etoposide (VEPESID) 180 mg in sodium chloride 0.9 % 500 mL chemo infusion, 100 mg/m2 = 180 mg, Intravenous,  Once, 2 of 4 cycles Administration: 180 mg (03/17/2019), 180 mg (03/18/2019), 180 mg (03/19/2019), 180 mg (04/09/2019), 180 mg (04/07/2019), 180 mg (04/08/2019)  for chemotherapy treatment.      Interval history-Gloria Allen, 73 year old female with above history of stage III colon cancer status post resection with colostomy, refused  adjuvant chemotherapy currently on surveillance and new diagnosis of limited stage small cell lung cancer currently on carbo-etoposide with concurrent radiation, presents to symptom management clinic for nausea.  She was receiving radiation treatment today and reported nausea to  nursing staff.  Said she felt like she needed fluids.  She denies vomiting.  Pain is stable.  Denies fever or chills.  Has not taken anything for her symptoms.  She denies sick contacts.  Symptoms not interfering with ADLs.  She had EGD on 03/27/2019 which was reported as normal.  Otherwise, she feels at baseline and denies other specific complaints.  ECOG FS:1 - Symptomatic but completely ambulatory  Review of systems- Review of Systems  Constitutional: Negative for chills, fever, malaise/fatigue and weight loss.  HENT: Negative for hearing loss, nosebleeds, sore throat and tinnitus.   Eyes: Negative for blurred vision and double vision.  Respiratory: Negative for cough, hemoptysis, shortness of breath and wheezing.   Cardiovascular: Negative for chest pain, palpitations and leg swelling.  Gastrointestinal: Positive for nausea. Negative for abdominal pain, blood in stool, constipation, diarrhea, melena and vomiting.  Genitourinary: Negative for dysuria and urgency.  Musculoskeletal: Negative for back pain, falls, joint pain and myalgias.  Skin: Negative for itching and rash.  Neurological: Negative for dizziness, tingling, sensory change, loss of consciousness, weakness and headaches.  Endo/Heme/Allergies: Negative for environmental allergies. Does not bruise/bleed easily.  Psychiatric/Behavioral: Negative for depression. The patient is not nervous/anxious and does not have insomnia.      Current treatment-carbo-etoposide with concurrent radiation.  Colon cancer on surveillance (declined adjuvant chemotherapy)  Allergies  Allergen Reactions  . Ace Inhibitors Swelling  . Angiotensin Receptor Blockers Swelling  . Sulfa Antibiotics Itching  . Chlorthalidone Rash  . Flexeril [Cyclobenzaprine] Rash    Past Medical History:  Diagnosis Date  . Anxiety   . Arthritis   . Asthma   . Cancer ([place])   . Cervical central spinal stenosis (C3-C7) (worse at C4-5) 07/30/2017  . Cervical foraminal  stenosis (C4-5 and C5-6) (Bilateral) (L>R) 07/30/2017  . Chronic lower extremity pain (Fourth Area of Pain) (Bilateral) (R>L) 06/11/2017  . Chronic neck pain (Primary Area of Pain) (Bilateral) (R>L) 06/11/2017  . Chronic sacroiliac joint pain (Bilateral) (R>L) 06/11/2017  . Chronic shoulder pain [facility] Area of Pain) (Right) 06/11/2017  . Chronic upper extremity pain (Fifth Area of Pain) (Bilateral) (R>L) 06/11/2017  . Colon cancer ([place])   . DDD (degenerative disc disease), cervical 07/30/2017  . DDD (degenerative disc disease), lumbar 07/30/2017  . Depression   . DISH (diffuse idiopathic skeletal hyperostosis) 07/30/2017  . Dyspnea    with exertion  . Entrapment syndrome 06/20/2017   2002 on R and 2010 on L  . Full thickness rotator cuff tear 06/12/2017  . GERD (gastroesophageal reflux disease)   . Grade 1 Anterolisthesis of L4 over L5 07/30/2017  . Headache   . Hx: UTI (urinary tract infection)   . Hypertension   . Inflammation of joint of shoulder region 01/15/2017  . Lumbar central spinal stenosis (L4-5) 07/30/2017  . Lumbar disc protrusion (Left: L5-S1) (Right: L1-2) 07/30/2017   L5-S1 left foraminal protrusion with L5 impingement. L1-2 right paracentral protrusion without impingement.  . Lumbar facet arthropathy (Bilateral) 07/30/2017  . Lumbar facet syndrome (Bilateral) (R>L) 07/30/2017  . Lung cancer ([place])   . Osteoarthritis of shoulder (Right) 01/15/2017    Past Surgical History:  Procedure Laterality Date  . ABDOMINAL HYSTERECTOMY    . APPENDECTOMY    . BREAST REDUCTION  SURGERY Bilateral   . COLONOSCOPY WITH PROPOFOL N/A 11/05/2018   Procedure: COLONOSCOPY WITH PROPOFOL;  Surgeon: Virgel Manifold, MD;  Location: [facility] ENDOSCOPY;  Service: Endoscopy;  Laterality: N/A;  . COLONOSCOPY WITH PROPOFOL N/A 11/06/2018   Procedure: COLONOSCOPY WITH PROPOFOL;  Surgeon: Virgel Manifold, MD;  Location: [facility] ENDOSCOPY;  Service: Endoscopy;  Laterality: N/A;  . COLOSTOMY    .  DILATION AND CURETTAGE OF UTERUS    . ESOPHAGOGASTRODUODENOSCOPY (EGD) WITH PROPOFOL N/A 11/05/2018   Procedure: ESOPHAGOGASTRODUODENOSCOPY (EGD) WITH PROPOFOL;  Surgeon: Virgel Manifold, MD;  Location: [facility] ENDOSCOPY;  Service: Endoscopy;  Laterality: N/A;  . ESOPHAGOGASTRODUODENOSCOPY (EGD) WITH PROPOFOL N/A 03/27/2019   Procedure: ESOPHAGOGASTRODUODENOSCOPY (EGD) WITH PROPOFOL;  Surgeon: Lucilla Lame, MD;  Location: [facility] ENDOSCOPY;  Service: Endoscopy;  Laterality: N/A;  . ILEOSTOMY Right 11/27/2018   Procedure: ILEOSTOMY;  Surgeon: Jules Husbands, MD;  Location: [facility] ORS;  Service: General;  Laterality: Right;  . LAPAROSCOPIC SIGMOID COLECTOMY N/A 11/27/2018   Procedure: LAPAROSCOPIC SIGMOID COLECTOMY;  Surgeon: Jules Husbands, MD;  Location: [facility] ORS;  Service: General;  Laterality: N/A;  . ORIF ANKLE FRACTURE Right 11/21/2018   Procedure: OPEN REDUCTION INTERNAL FIXATION (ORIF) ANKLE FRACTURE;  Surgeon: Corky Mull, MD;  Location: [facility] ORS;  Service: Orthopedics;  Laterality: Right;  . PORTACATH PLACEMENT N/A 03/12/2019   Procedure: INSERTION PORT-A-CATH;  Surgeon: Jules Husbands, MD;  Location: [facility] ORS;  Service: General;  Laterality: N/A;  . REDUCTION MAMMAPLASTY    . SHOULDER ARTHROSCOPY WITH OPEN ROTATOR CUFF REPAIR AND DISTAL CLAVICLE ACROMINECTOMY Right 03/26/2017   Procedure: right shoulder arthroscopy, arthroscopic subacromial decompression, distal clavicle excision, mini open rotator cuff repair;  Surgeon: Thornton Park, MD;  Location: [facility] ORS;  Service: Orthopedics;  Laterality: Right;    Social History   Socioeconomic History  . Marital status: Divorced    Spouse name: Not on file  . Number of children: 3  . Years of education: Not on file  . Highest education level: 11th grade  Occupational History  . Not on file  Social Needs  . Financial resource strain: Somewhat hard  . Food insecurity    Worry: Sometimes true    Inability: Never true  . Transportation needs     Medical: Yes    Non-medical: No  Tobacco Use  . Smoking status: Former Smoker    Packs/day: 1.00    Types: Cigarettes    Quit date: 2010    Years since quitting: 10.5  . Smokeless tobacco: Never Used  . Tobacco comment: quit over 30 years ago   Substance and Sexual Activity  . Alcohol use: No  . Drug use: Yes    Types: Marijuana  . Sexual activity: Never  Lifestyle  . Physical activity    Days per week: 0 days    Minutes per session: 0 min  . Stress: Not at all  Relationships  . Social Herbalist on phone: Never    Gets together: Never    Attends religious service: More than 4 times per year    Active member of club or organization: No    Attends meetings of clubs or organizations: Never    Relationship status: Divorced  . Intimate partner violence    Fear of current or ex partner: No    Emotionally abused: No    Physically abused: No    Forced sexual activity: No  Other Topics Concern  . 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 She receives disability. She has 3 children and several grandchildren. She lives alone. She drives. Uses a walker and shower chair at baseline.     Family History  Problem Relation Age of Onset  . Hypertension Mother   . Breast cancer Mother 70  . Breast cancer Sister 76     Current Outpatient Medications:  .  albuterol (VENTOLIN HFA) 108 (90 Base) MCG/ACT inhaler, Inhale 2 puffs into the lungs every 6 (six) hours as needed for wheezing or shortness of breath., Disp: 18 g, Rfl: 0 .  amLODipine (NORVASC) 10 MG tablet, Take 1 tablet (10 mg total) by mouth daily., Disp: 30 tablet, Rfl: 0 .  BREO ELLIPTA 200-25 MCG/INH AEPB, Inhale 1 puff into the lungs daily., Disp: 60 each, Rfl: 0 .  clonazePAM (KLONOPIN) 0.5 MG tablet, Take 1 tablet (0.5 mg total) by mouth 3 (three) times daily., Disp: 30 tablet, Rfl: 0 .  cloNIDine (CATAPRES) 0.2 MG tablet, Take 1 tablet (0.2 mg total) by mouth 2 (two)  times daily., Disp: 60 tablet, Rfl: 0 .  hydrALAZINE (APRESOLINE) 100 MG tablet, Take 1 tablet (100 mg total) by mouth 3 (three) times daily., Disp: 90 tablet, Rfl: 0 .  hydrOXYzine (ATARAX/VISTARIL) 10 MG tablet, Take 1 tablet (10 mg total) by mouth 3 (three) times daily as needed for itching., Disp: 30 tablet, Rfl: 0 .  ibuprofen (ADVIL) 800 MG tablet, Take 1 tablet (800 mg total) by mouth every 8 (eight) hours as needed., Disp: 20 tablet, Rfl: 0 .  loperamide (IMODIUM) 2 MG capsule, Take 2 capsules (4 mg total) by mouth 3 (three) times daily., Disp: 180 capsule, Rfl: 0 .  lovastatin (MEVACOR) 40 MG tablet, Take 1 tablet (40 mg total) by mouth at bedtime., Disp: 30 tablet, Rfl: 0 .  meclizine (ANTIVERT) 12.5 MG tablet, Take 1 tablet (12.5 mg total) by mouth 3 (three) times daily as needed for dizziness., Disp: 30 tablet, Rfl: 0 .  megestrol (MEGACE) 400 MG/10ML suspension, Take 10 mLs (400 mg total) by mouth daily for 30 doses., Disp: 480 mL, Rfl: 2 .  methocarbamol (ROBAXIN) 500 MG tablet, Take 1 tablet (500 mg total) by mouth at bedtime as needed for muscle spasms., Disp: 30 tablet, Rfl: 0 .  metoprolol succinate (TOPROL-XL) 100 MG 24 hr tablet, Take 1 tablet (100 mg total) by mouth daily. Take with or immediately following a meal., Disp: 30 tablet, Rfl: 0 .  mirtazapine (REMERON) 15 MG tablet, take 1 tablet by mouth at bedtime for APPETITIE, Disp: 30 tablet, Rfl: 0 .  montelukast (SINGULAIR) 10 MG tablet, Take 1 tablet (10 mg total) by mouth at bedtime as needed., Disp: 30 tablet, Rfl: 0 .  nystatin (MYCOSTATIN) 100000 UNIT/ML suspension, Take 5 mLs (500,000 Units total) by mouth 4 (four) times daily., Disp: 60 mL, Rfl: 0 .  OLANZapine (ZYPREXA) 10 MG tablet, Take 1 tablet (10 mg total) by mouth at bedtime., Disp: 30 tablet, Rfl: 0 .  omeprazole (PRILOSEC) 40 MG capsule, Take 1 capsule (40 mg total) by mouth 2 (two) times daily., Disp: 60 capsule, Rfl: 0 .  ondansetron (ZOFRAN) 8 MG tablet, Take  1 tablet (8 mg total) by mouth 2 (two) times daily as needed for refractory nausea / vomiting. Start on day 3 after carboplatin chemo., Disp: 30 tablet, Rfl: 0 .  oxybutynin (DITROPAN) 5 MG tablet, Take 1 tablet (5 mg total) by mouth 2 (two) times  daily., Disp: 60 tablet, Rfl: 0 .  oxyCODONE-acetaminophen (PERCOCET/ROXICET) 5-325 MG tablet, Take 1 tablet by mouth every 6 (six) hours as needed for severe pain., Disp: 28 tablet, Rfl: 0 .  Potassium 99 MG TABS, Take 1 tablet (99 mg total) by mouth daily after breakfast., Disp: 30 tablet, Rfl: 0 .  potassium chloride SA (K-DUR) 20 MEQ tablet, Take 1 tablet (20 mEq total) by mouth daily., Disp: 20 tablet, Rfl: 0 .  prochlorperazine (COMPAZINE) 10 MG tablet, Take 1 tablet (10 mg total) by mouth every 6 (six) hours as needed (Nausea or vomiting)., Disp: 30 tablet, Rfl: 0 .  sertraline (ZOLOFT) 100 MG tablet, Take 2 tablets (200 mg total) by mouth at bedtime., Disp: 60 tablet, Rfl: 0 .  terazosin (HYTRIN) 1 MG capsule, Take 1 capsule (1 mg total) by mouth at bedtime., Disp: 30 capsule, Rfl: 0 .  tiZANidine (ZANAFLEX) 2 MG tablet, Take 1 tablet (2 mg total) by mouth every 6 (six) hours as needed for muscle spasms., Disp: 30 tablet, Rfl: 0 .  traZODone (DESYREL) 50 MG tablet, Take 0.5-1 tablets (25-50 mg total) by mouth at bedtime as needed for sleep., Disp: 30 tablet, Rfl: 0 .  triamcinolone cream (KENALOG) 0.1 %, Apply 1 application topically 2 (two) times daily as needed (itching)., Disp: 15 g, Rfl: 0 No current facility-administered medications for this visit.   Facility-Administered Medications Ordered in Other Visits:  .  heparin lock flush 100 unit/mL, 500 Units, Intravenous, Once, Verlon Au, NP .  sodium chloride flush (NS) 0.9 % injection 10 mL, 10 mL, Intravenous, PRN, Verlon Au, NP, 10 mL at 04/13/19 1430  Physical exam:  Vitals:   04/13/19 1453  BP: 112/76  Pulse: 88  Resp: 18  Temp: 97.7 F (36.5 C)  TempSrc: Tympanic    Physical Exam Constitutional:      General: She is not in acute distress.    Appearance: She is well-developed. She is not ill-appearing.     Comments: Talking on phone; cheerful.   HENT:     Head: Atraumatic.     Nose: Nose normal.     Mouth/Throat:     Pharynx: No oropharyngeal exudate.  Eyes:     General: No scleral icterus.    Conjunctiva/sclera: Conjunctivae normal.     Pupils: Pupils are equal, round, and reactive to light.  Neck:     Musculoskeletal: Normal range of motion and neck supple.  Cardiovascular:     Rate and Rhythm: Normal rate and regular rhythm.  Pulmonary:     Effort: Pulmonary effort is normal.     Breath sounds: Normal breath sounds.  Abdominal:     General: There is no distension.     Palpations: Abdomen is soft.     Tenderness: There is no abdominal tenderness.     Comments: Colostomy in place with soft stool in bag  Musculoskeletal: Normal range of motion.  Skin:    General: Skin is warm and dry.  Neurological:     Mental Status: She is alert and oriented to person, place, and time.  Psychiatric:        Mood and Affect: Mood normal.        Behavior: Behavior normal.      CMP Latest Ref Rng & Units 04/13/2019  Glucose 70 - 99 mg/dL 160(H)  BUN 8 - 23 mg/dL 18  Creatinine 0.44 - 1.00 mg/dL 0.87  Sodium 135 - 145 mmol/L 137  Potassium 3.5 - 5.1  mmol/L 3.2(L)  Chloride 98 - 111 mmol/L 109  CO2 22 - 32 mmol/L 22  Calcium 8.9 - 10.3 mg/dL 8.8(L)  Total Protein 6.5 - 8.1 g/dL 7.0  Total Bilirubin 0.3 - 1.2 mg/dL 0.5  Alkaline Phos 38 - 126 U/L 90  AST 15 - 41 U/L 19  ALT 0 - 44 U/L 20   CBC Latest Ref Rng & Units 04/13/2019  WBC 4.0 - 10.5 K/uL 4.2  Hemoglobin 12.0 - 15.0 g/dL 8.6(L)  Hematocrit 36.0 - 46.0 % 25.9(L)  Platelets 150 - 400 K/uL 409(H)    No images are attached to the encounter.  Ct Chest W Contrast  Result Date: 03/24/2019 CLINICAL DATA:  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 Diagnosis May 2020.  Currently undergoing chemotherapy and radiation. EXAM: CT CHEST, ABDOMEN, AND PELVIS WITH CONTRAST TECHNIQUE: Multidetector CT imaging of the chest, abdomen and pelvis was performed following the standard protocol during bolus administration of intravenous contrast. CONTRAST:  138m OMNIPAQUE IOHEXOL 300 MG/ML  SOLN COMPARISON:  PET-CT 02/05/2019 FINDINGS: CT CHEST FINDINGS Cardiovascular: The heart is normal in size. No pericardial effusion. The aorta is normal in caliber. Moderate atherosclerotic calcifications. No dissection. The branch vessels are patent. Stable coronary artery calcifications. Mediastinum/Nodes: No mediastinal or hilar mass or lymphadenopathy. The esophagus is unremarkable. Lungs/Pleura: Stable underlying emphysematous changes. The right upper lobe pulmonary lesion measures 7.5 mm on image number 58 and previously measured 15.5 mm. Semi solid nodular left upper lobe lesion peripherally on image number 85 measures 18 mm and is unchanged. This was not hypermetabolic on the recent PET-CT but recommend continued observation/surveillance. A few tiny scattered subpleural nodules are stable and likely represent lymph nodes. No infiltrates or effusions. Musculoskeletal: No breast masses, supraclavicular or axillary adenopathy. The right-sided Port-A-Cath is stable. The thyroid gland is unremarkable. Stable small right nodule. No significant bony findings. CT ABDOMEN PELVIS FINDINGS Hepatobiliary: Stable low-attenuation lesion in segment 6 most consistent with a benign hepatic cyst. No worrisome hepatic lesions or intrahepatic biliary dilatation. The gallbladder is surgically absent. Mild associated common bile duct dilatation. Pancreas: No mass, inflammation or ductal dilatation. Spleen: Normal size.  No focal lesions. Adrenals/Urinary Tract: The adrenal glands are normal in stable. No renal, ureteral or bladder calculi or mass. Stomach/Bowel: The stomach, duodenum, small bowel and colon are  unremarkable. No acute inflammatory changes, mass lesions or obstructive findings. Patient has a loop ileostomy in the right lower quadrant. Prior left hemicolectomy with transverse colon to rectal anastomosis. There is dense contrast in the rectum with moderate artifact but no findings suspicious for residual or recurrent tumor. Vascular/Lymphatic: Stable aortic and iliac artery calcifications but no aneurysm. The major venous structures are patent. No mesenteric or retroperitoneal mass or adenopathy. No pelvic lymphadenopathy. Reproductive: The uterus is surgically absent. Both ovaries are still present and appear normal. There is a simple appearing cyst associated with the left ovary. Other: No free pelvic fluid collections or pelvic abscess. No inguinal mass or adenopathy. No subcutaneous lesions. Musculoskeletal: No significant bony findings. IMPRESSION: 1. Interval decrease in size of the right upper lobe pulmonary nodule now measuring 7.5 mm and previously measuring 15.5 mm. 2. Stable 18 mm semi-solid airspace nodule in the left upper lobe. Recommend continued surveillance. 3. No mediastinal or hilar mass or adenopathy. 4. Stable surgical changes involving the colon. No findings suspicious for recurrent tumor, locoregional adenopathy or distant metastatic disease. Electronically Signed   By: PMarijo SanesM.D.   On: 03/24/2019  15:26   Ct Abdomen Pelvis W Contrast  Result Date: 03/24/2019 CLINICAL DATA:  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 Diagnosis May 2020. Currently undergoing chemotherapy and radiation. EXAM: CT CHEST, ABDOMEN, AND PELVIS WITH CONTRAST TECHNIQUE: Multidetector CT imaging of the chest, abdomen and pelvis was performed following the standard protocol during bolus administration of intravenous contrast. CONTRAST:  160m OMNIPAQUE IOHEXOL 300 MG/ML  SOLN COMPARISON:  PET-CT 02/05/2019 FINDINGS: CT CHEST FINDINGS Cardiovascular: The heart is normal in size. No  pericardial effusion. The aorta is normal in caliber. Moderate atherosclerotic calcifications. No dissection. The branch vessels are patent. Stable coronary artery calcifications. Mediastinum/Nodes: No mediastinal or hilar mass or lymphadenopathy. The esophagus is unremarkable. Lungs/Pleura: Stable underlying emphysematous changes. The right upper lobe pulmonary lesion measures 7.5 mm on image number 58 and previously measured 15.5 mm. Semi solid nodular left upper lobe lesion peripherally on image number 85 measures 18 mm and is unchanged. This was not hypermetabolic on the recent PET-CT but recommend continued observation/surveillance. A few tiny scattered subpleural nodules are stable and likely represent lymph nodes. No infiltrates or effusions. Musculoskeletal: No breast masses, supraclavicular or axillary adenopathy. The right-sided Port-A-Cath is stable. The thyroid gland is unremarkable. Stable small right nodule. No significant bony findings. CT ABDOMEN PELVIS FINDINGS Hepatobiliary: Stable low-attenuation lesion in segment 6 most consistent with a benign hepatic cyst. No worrisome hepatic lesions or intrahepatic biliary dilatation. The gallbladder is surgically absent. Mild associated common bile duct dilatation. Pancreas: No mass, inflammation or ductal dilatation. Spleen: Normal size.  No focal lesions. Adrenals/Urinary Tract: The adrenal glands are normal in stable. No renal, ureteral or bladder calculi or mass. Stomach/Bowel: The stomach, duodenum, small bowel and colon are unremarkable. No acute inflammatory changes, mass lesions or obstructive findings. Patient has a loop ileostomy in the right lower quadrant. Prior left hemicolectomy with transverse colon to rectal anastomosis. There is dense contrast in the rectum with moderate artifact but no findings suspicious for residual or recurrent tumor. Vascular/Lymphatic: Stable aortic and iliac artery calcifications but no aneurysm. The major venous  structures are patent. No mesenteric or retroperitoneal mass or adenopathy. No pelvic lymphadenopathy. Reproductive: The uterus is surgically absent. Both ovaries are still present and appear normal. There is a simple appearing cyst associated with the left ovary. Other: No free pelvic fluid collections or pelvic abscess. No inguinal mass or adenopathy. No subcutaneous lesions. Musculoskeletal: No significant bony findings. IMPRESSION: 1. Interval decrease in size of the right upper lobe pulmonary nodule now measuring 7.5 mm and previously measuring 15.5 mm. 2. Stable 18 mm semi-solid airspace nodule in the left upper lobe. Recommend continued surveillance. 3. No mediastinal or hilar mass or adenopathy. 4. Stable surgical changes involving the colon. No findings suspicious for recurrent tumor, locoregional adenopathy or distant metastatic disease. Electronically Signed   By: PMarijo SanesM.D.   On: 03/24/2019 15:26    Assessment and plan- Patient is a 73y.o. female with history of stage III colon cancer status post resection, declined adjuvant chemotherapy, currently on surveillance and new diagnosis of limited stage small cell lung cancer currently status post cycle 2 of carbo-etoposide with concurrent radiation who presents to symptom management clinic for nausea.  1.  Chemotherapy-induced nausea and convalescence-labs overall stable.  IV fluids in clinic today.  Declined antiemetics in clinic stating nausea was resolved.  Okay to discharge home after fluids.  Continue oral antiemetics as prescribed.   2.  Hypokalemia-likely secondary to GI loss and inadequate  oral intake.  Potassium improved with oral potassium supplementation.  3.2 today; previously 2.8.  Encouraged continue potassium supplementation per Dr. Janese Banks and increase intake of potassium rich foods.   Return to clinic if symptoms not improved or worsen.  Follow-up with Dr. Janese Banks as scheduled.  Visit Diagnosis 1. Chemotherapy-induced nausea    2. Hypokalemia     Patient expressed understanding and was in agreement with this plan. She also understands that She can call clinic at any time with any questions, concerns, or complaints.   Thank you for allowing me to participate in the care of this very pleasant patient.   Beckey Rutter, DNP, AGNP-C [facility] [phone] (work cell) [phone] (office)  CC: Dr. Janese Banks

## 2019-04-14 ENCOUNTER — Ambulatory Visit
Admission: RE | Admit: 2019-04-14 | Discharge: 2019-04-14 | Disposition: A | Discharge status: Home / Self Care | Payer: Medicare Other | Source: Ambulatory Visit | Attending: Radiation Oncology | Admitting: Radiation Oncology

## 2019-04-14 ENCOUNTER — Other Ambulatory Visit: Payer: Self-pay

## 2019-04-14 DIAGNOSIS — C3411 Malignant neoplasm of upper lobe, right bronchus or lung: Secondary | ICD-10-CM | POA: Diagnosis not present

## 2019-04-14 DIAGNOSIS — Z510 Encounter for antineoplastic radiation therapy: Secondary | ICD-10-CM | POA: Diagnosis not present

## 2019-04-14 DIAGNOSIS — Z87891 Personal history of nicotine dependence: Secondary | ICD-10-CM | POA: Diagnosis not present

## 2019-04-15 ENCOUNTER — Other Ambulatory Visit: Payer: Self-pay

## 2019-04-15 ENCOUNTER — Ambulatory Visit
Admission: RE | Admit: 2019-04-15 | Discharge: 2019-04-15 | Disposition: A | Discharge status: Home / Self Care | Payer: Medicare Other | Source: Ambulatory Visit | Attending: Radiation Oncology | Admitting: Radiation Oncology

## 2019-04-15 DIAGNOSIS — E876 Hypokalemia: Secondary | ICD-10-CM | POA: Insufficient documentation

## 2019-04-15 DIAGNOSIS — R110 Nausea: Secondary | ICD-10-CM | POA: Insufficient documentation

## 2019-04-15 DIAGNOSIS — Z87891 Personal history of nicotine dependence: Secondary | ICD-10-CM | POA: Diagnosis not present

## 2019-04-15 DIAGNOSIS — Z510 Encounter for antineoplastic radiation therapy: Secondary | ICD-10-CM | POA: Diagnosis not present

## 2019-04-15 DIAGNOSIS — C3411 Malignant neoplasm of upper lobe, right bronchus or lung: Secondary | ICD-10-CM | POA: Diagnosis not present

## 2019-04-16 ENCOUNTER — Ambulatory Visit
Admission: RE | Admit: 2019-04-16 | Discharge: 2019-04-16 | Disposition: A | Discharge status: Home / Self Care | Payer: Medicare Other | Source: Ambulatory Visit | Attending: Radiation Oncology | Admitting: Radiation Oncology

## 2019-04-16 ENCOUNTER — Other Ambulatory Visit: Payer: Self-pay | Admitting: *Deleted

## 2019-04-16 ENCOUNTER — Other Ambulatory Visit: Payer: Self-pay

## 2019-04-16 ENCOUNTER — Ambulatory Visit: Payer: Medicare Other

## 2019-04-16 DIAGNOSIS — J45909 Unspecified asthma, uncomplicated: Secondary | ICD-10-CM | POA: Diagnosis not present

## 2019-04-16 DIAGNOSIS — Z433 Encounter for attention to colostomy: Secondary | ICD-10-CM | POA: Diagnosis not present

## 2019-04-16 DIAGNOSIS — C3400 Malignant neoplasm of unspecified main bronchus: Secondary | ICD-10-CM | POA: Diagnosis not present

## 2019-04-16 DIAGNOSIS — Z510 Encounter for antineoplastic radiation therapy: Secondary | ICD-10-CM | POA: Diagnosis not present

## 2019-04-16 DIAGNOSIS — M533 Sacrococcygeal disorders, not elsewhere classified: Secondary | ICD-10-CM | POA: Diagnosis not present

## 2019-04-16 DIAGNOSIS — K219 Gastro-esophageal reflux disease without esophagitis: Secondary | ICD-10-CM | POA: Diagnosis not present

## 2019-04-16 DIAGNOSIS — M4810 Ankylosing hyperostosis [Forestier], site unspecified: Secondary | ICD-10-CM | POA: Diagnosis not present

## 2019-04-16 DIAGNOSIS — C3411 Malignant neoplasm of upper lobe, right bronchus or lung: Secondary | ICD-10-CM | POA: Diagnosis not present

## 2019-04-16 DIAGNOSIS — M4802 Spinal stenosis, cervical region: Secondary | ICD-10-CM | POA: Diagnosis not present

## 2019-04-16 DIAGNOSIS — M19011 Primary osteoarthritis, right shoulder: Secondary | ICD-10-CM | POA: Diagnosis not present

## 2019-04-16 DIAGNOSIS — C3490 Malignant neoplasm of unspecified part of unspecified bronchus or lung: Secondary | ICD-10-CM

## 2019-04-16 DIAGNOSIS — I10 Essential (primary) hypertension: Secondary | ICD-10-CM | POA: Diagnosis not present

## 2019-04-16 DIAGNOSIS — Z1389 Encounter for screening for other disorder: Secondary | ICD-10-CM | POA: Diagnosis not present

## 2019-04-16 DIAGNOSIS — Z13820 Encounter for screening for osteoporosis: Secondary | ICD-10-CM | POA: Diagnosis not present

## 2019-04-16 DIAGNOSIS — M4316 Spondylolisthesis, lumbar region: Secondary | ICD-10-CM | POA: Diagnosis not present

## 2019-04-16 DIAGNOSIS — M5136 Other intervertebral disc degeneration, lumbar region: Secondary | ICD-10-CM | POA: Diagnosis not present

## 2019-04-16 DIAGNOSIS — Z85038 Personal history of other malignant neoplasm of large intestine: Secondary | ICD-10-CM | POA: Diagnosis not present

## 2019-04-16 DIAGNOSIS — Z0000 Encounter for general adult medical examination without abnormal findings: Secondary | ICD-10-CM | POA: Diagnosis not present

## 2019-04-16 DIAGNOSIS — Z87891 Personal history of nicotine dependence: Secondary | ICD-10-CM | POA: Diagnosis not present

## 2019-04-16 DIAGNOSIS — G8929 Other chronic pain: Secondary | ICD-10-CM | POA: Diagnosis not present

## 2019-04-16 DIAGNOSIS — M5030 Other cervical disc degeneration, unspecified cervical region: Secondary | ICD-10-CM | POA: Diagnosis not present

## 2019-04-16 DIAGNOSIS — Z87440 Personal history of urinary (tract) infections: Secondary | ICD-10-CM | POA: Diagnosis not present

## 2019-04-17 ENCOUNTER — Inpatient Hospital Stay: Payer: Medicare Other

## 2019-04-17 ENCOUNTER — Inpatient Hospital Stay: Payer: Medicare Other | Admitting: Oncology

## 2019-04-17 ENCOUNTER — Ambulatory Visit: Payer: Medicare Other

## 2019-04-17 ENCOUNTER — Inpatient Hospital Stay (HOSPITAL_BASED_OUTPATIENT_CLINIC_OR_DEPARTMENT_OTHER): Payer: Medicare Other | Admitting: Oncology

## 2019-04-17 ENCOUNTER — Encounter: Payer: Self-pay | Admitting: Oncology

## 2019-04-17 ENCOUNTER — Other Ambulatory Visit: Payer: Self-pay

## 2019-04-17 VITALS — BP 144/75 | HR 68 | Temp 95.7°F | Resp 16 | Ht 65.0 in | Wt 151.8 lb

## 2019-04-17 DIAGNOSIS — C3411 Malignant neoplasm of upper lobe, right bronchus or lung: Secondary | ICD-10-CM

## 2019-04-17 DIAGNOSIS — M533 Sacrococcygeal disorders, not elsewhere classified: Secondary | ICD-10-CM | POA: Diagnosis not present

## 2019-04-17 DIAGNOSIS — I10 Essential (primary) hypertension: Secondary | ICD-10-CM | POA: Diagnosis not present

## 2019-04-17 DIAGNOSIS — M5030 Other cervical disc degeneration, unspecified cervical region: Secondary | ICD-10-CM | POA: Diagnosis not present

## 2019-04-17 DIAGNOSIS — Z5189 Encounter for other specified aftercare: Secondary | ICD-10-CM | POA: Diagnosis not present

## 2019-04-17 DIAGNOSIS — E876 Hypokalemia: Secondary | ICD-10-CM | POA: Diagnosis not present

## 2019-04-17 DIAGNOSIS — D6481 Anemia due to antineoplastic chemotherapy: Secondary | ICD-10-CM | POA: Diagnosis not present

## 2019-04-17 DIAGNOSIS — M4810 Ankylosing hyperostosis [Forestier], site unspecified: Secondary | ICD-10-CM | POA: Diagnosis not present

## 2019-04-17 DIAGNOSIS — M4802 Spinal stenosis, cervical region: Secondary | ICD-10-CM | POA: Diagnosis not present

## 2019-04-17 DIAGNOSIS — Z433 Encounter for attention to colostomy: Secondary | ICD-10-CM | POA: Diagnosis not present

## 2019-04-17 DIAGNOSIS — Z5112 Encounter for antineoplastic immunotherapy: Secondary | ICD-10-CM | POA: Diagnosis not present

## 2019-04-17 DIAGNOSIS — G8929 Other chronic pain: Secondary | ICD-10-CM | POA: Diagnosis not present

## 2019-04-17 DIAGNOSIS — M5136 Other intervertebral disc degeneration, lumbar region: Secondary | ICD-10-CM | POA: Diagnosis not present

## 2019-04-17 DIAGNOSIS — Z85038 Personal history of other malignant neoplasm of large intestine: Secondary | ICD-10-CM | POA: Diagnosis not present

## 2019-04-17 DIAGNOSIS — Z510 Encounter for antineoplastic radiation therapy: Secondary | ICD-10-CM | POA: Diagnosis not present

## 2019-04-17 DIAGNOSIS — J45909 Unspecified asthma, uncomplicated: Secondary | ICD-10-CM | POA: Diagnosis not present

## 2019-04-17 DIAGNOSIS — C3400 Malignant neoplasm of unspecified main bronchus: Secondary | ICD-10-CM | POA: Diagnosis not present

## 2019-04-17 DIAGNOSIS — R05 Cough: Secondary | ICD-10-CM | POA: Diagnosis not present

## 2019-04-17 DIAGNOSIS — R110 Nausea: Secondary | ICD-10-CM | POA: Diagnosis not present

## 2019-04-17 DIAGNOSIS — M19011 Primary osteoarthritis, right shoulder: Secondary | ICD-10-CM | POA: Diagnosis not present

## 2019-04-17 DIAGNOSIS — K219 Gastro-esophageal reflux disease without esophagitis: Secondary | ICD-10-CM | POA: Diagnosis not present

## 2019-04-17 DIAGNOSIS — Z87891 Personal history of nicotine dependence: Secondary | ICD-10-CM | POA: Diagnosis not present

## 2019-04-17 DIAGNOSIS — T451X5A Adverse effect of antineoplastic and immunosuppressive drugs, initial encounter: Secondary | ICD-10-CM

## 2019-04-17 DIAGNOSIS — M4316 Spondylolisthesis, lumbar region: Secondary | ICD-10-CM | POA: Diagnosis not present

## 2019-04-17 DIAGNOSIS — Z87440 Personal history of urinary (tract) infections: Secondary | ICD-10-CM | POA: Diagnosis not present

## 2019-04-17 DIAGNOSIS — C3490 Malignant neoplasm of unspecified part of unspecified bronchus or lung: Secondary | ICD-10-CM

## 2019-04-17 LAB — CBC WITH DIFFERENTIAL/PLATELET
Abs Immature Granulocytes: 0.01 10*3/uL (ref 0.00–0.07)
Basophils Absolute: 0 10*3/uL (ref 0.0–0.1)
Basophils Relative: 1 %
Eosinophils Absolute: 0 10*3/uL (ref 0.0–0.5)
Eosinophils Relative: 0 %
HCT: 23.1 % — ABNORMAL LOW (ref 36.0–46.0)
Hemoglobin: 7.4 g/dL — ABNORMAL LOW (ref 12.0–15.0)
Immature Granulocytes: 0 %
Lymphocytes Relative: 21 %
Lymphs Abs: 0.6 10*3/uL — ABNORMAL LOW (ref 0.7–4.0)
MCH: 27.8 pg (ref 26.0–34.0)
MCHC: 32 g/dL (ref 30.0–36.0)
MCV: 86.8 fL (ref 80.0–100.0)
Monocytes Absolute: 0.1 10*3/uL (ref 0.1–1.0)
Monocytes Relative: 3 %
Neutro Abs: 2.2 10*3/uL (ref 1.7–7.7)
Neutrophils Relative %: 75 %
Platelets: 116 10*3/uL — ABNORMAL LOW (ref 150–400)
RBC: 2.66 MIL/uL — ABNORMAL LOW (ref 3.87–5.11)
RDW: 14.9 % (ref 11.5–15.5)
Smear Review: NORMAL
WBC: 3 10*3/uL — ABNORMAL LOW (ref 4.0–10.5)
nRBC: 0 % (ref 0.0–0.2)

## 2019-04-17 LAB — COMPREHENSIVE METABOLIC PANEL
ALT: 20 U/L (ref 0–44)
AST: 17 U/L (ref 15–41)
Albumin: 3.6 g/dL (ref 3.5–5.0)
Alkaline Phosphatase: 87 U/L (ref 38–126)
Anion gap: 9 (ref 5–15)
BUN: 11 mg/dL (ref 8–23)
CO2: 23 mmol/L (ref 22–32)
Calcium: 9.1 mg/dL (ref 8.9–10.3)
Chloride: 107 mmol/L (ref 98–111)
Creatinine, Ser: 0.74 mg/dL (ref 0.44–1.00)
GFR calc Af Amer: >60 mL/min (ref >60)
GFR calc non Af Amer: >60 mL/min (ref >60)
Glucose, Bld: 106 mg/dL — ABNORMAL HIGH (ref 70–99)
Potassium: 3.4 mmol/L — ABNORMAL LOW (ref 3.5–5.1)
Sodium: 139 mmol/L (ref 135–145)
Total Bilirubin: 0.4 mg/dL (ref 0.3–1.2)
Total Protein: 6.6 g/dL (ref 6.5–8.1)

## 2019-04-17 MED ORDER — POTASSIUM CHLORIDE CRYS ER 20 MEQ PO TBCR: 20.0000 meq | EXTENDED_RELEASE_TABLET | Freq: Every day | ORAL | 0 refills | Status: DC

## 2019-04-17 MED ORDER — BENZONATATE 200 MG PO CAPS: 200.0000 mg | ORAL_CAPSULE | Freq: Three times a day (TID) | ORAL | 0 refills | Status: DC | PRN

## 2019-04-17 NOTE — Progress Notes (Signed)
Pt here after her chemo due to she has been feeling bad. Today she feels good about drinking fluids and feels better. She is complaining of pain on leg on right side and rad. To back

## 2019-04-17 NOTE — Progress Notes (Signed)
Hematology/Oncology Consult note [facility]  [phone] [phone]  Patient Care Team: Inc, [facility] as PCP - General Telford Nab, [place] as Registered Nurse   Name of the patient: Gloria Allen  [id number]  11-May-1946   Date of visit: 04/17/19  Diagnosis- 1.Stage III colon cancer status post surgery 2.New diagnosis of limited stage small cell lung cancer  Chief complaint/ Reason for visit-toxicity assessment after cycle 2 of carboplatin etoposide chemotherapy  Heme/Onc history: patient is a 73 year old African-American female who recently underwent screening colonoscopy by Dr. Bonna Gains.Colonoscopy showed villous nonobstructing large mass in the sigmoid colon 12 cm proximal to the anus. Mass was partially circumferential. Biopsy showed invasive adenocarcinoma. Patient was seen by Dr. Gretchen Short underwent hemicolectomy with lymph node sampling on 11/27/2018. Final pathology showed invasive colorectal carcinoma 3.5 cm, grade 2. Tumor invades through muscularis propria into the peri-colorectal tissue. Margins negative. Tumor deposits present, 2. 1 out of 9 lymph nodes examined was positive for malignancy. [id number].Patient was feeling poorly after surgery and she had no social support and decided against adjuvant chemotherapy.  Patient underwent CT chest to complete her staging work-up which showed 2 lung nodules one in the right upper lobe and one in the left left upper lobe.This was followed by a PET CT scan which showed with the left upper lobe nodule was not hypermetabolic.The right upper lobe lung nodule measure 1.6 cm with an SUV of 6.8. No hypermetabolic axillary mediastinal or hilar lymphadenopathy or evidence of metastatic disease elsewhere. Patient underwent CT-guided biopsy of the right upper lobe lung nodule which was consistent with small cell lung cancerpositive for CD56 and TTF-1.  Patient started  concurrent chemoradiation in June 2020.  Carboplatin chosen instead of cisplatin with etoposide given her age, poor social support and concern for renal dysfunction.   Interval history- feels fatigued. Reports low back pain which occasionally radiates to her lower extremities  ECOG PS- 1 Pain scale- 0 Opioid associated constipation- no  Review of systems- Review of Systems  Constitutional: Positive for malaise/fatigue. Negative for chills, fever and weight loss.  HENT: Negative for congestion, ear discharge and nosebleeds.   Eyes: Negative for blurred vision.  Respiratory: Negative for cough, hemoptysis, sputum production, shortness of breath and wheezing.   Cardiovascular: Negative for chest pain, palpitations, orthopnea and claudication.  Gastrointestinal: Negative for abdominal pain, blood in stool, constipation, diarrhea, heartburn, melena, nausea and vomiting.  Genitourinary: Negative for dysuria, flank pain, frequency, hematuria and urgency.  Musculoskeletal: Positive for back pain. Negative for joint pain and myalgias.  Skin: Negative for rash.  Neurological: Negative for dizziness, tingling, focal weakness, seizures, weakness and headaches.  Endo/Heme/Allergies: Does not bruise/bleed easily.  Psychiatric/Behavioral: Negative for depression and suicidal ideas. The patient does not have insomnia.        Allergies  Allergen Reactions  . Ace Inhibitors Swelling  . Angiotensin Receptor Blockers Swelling  . Sulfa Antibiotics Itching  . Chlorthalidone Rash  . Flexeril [Cyclobenzaprine] Rash     Past Medical History:  Diagnosis Date  . Anxiety   . Arthritis   . Asthma   . Cancer ([place])   . Cervical central spinal stenosis (C3-C7) (worse at C4-5) 07/30/2017  . Cervical foraminal stenosis (C4-5 and C5-6) (Bilateral) (L>R) 07/30/2017  . Chronic lower extremity pain (Fourth Area of Pain) (Bilateral) (R>L) 06/11/2017  . Chronic neck pain (Primary Area of Pain) (Bilateral) (R>L)  06/11/2017  . Chronic sacroiliac joint pain (Bilateral) (R>L) 06/11/2017  . Chronic  shoulder pain [facility] Area of Pain) (Right) 06/11/2017  . Chronic upper extremity pain (Fifth Area of Pain) (Bilateral) (R>L) 06/11/2017  . Colon cancer ([place])   . DDD (degenerative disc disease), cervical 07/30/2017  . DDD (degenerative disc disease), lumbar 07/30/2017  . Depression   . DISH (diffuse idiopathic skeletal hyperostosis) 07/30/2017  . Dyspnea    with exertion  . Entrapment syndrome 06/20/2017   2002 on R and 2010 on L  . Full thickness rotator cuff tear 06/12/2017  . GERD (gastroesophageal reflux disease)   . Grade 1 Anterolisthesis of L4 over L5 07/30/2017  . Headache   . Hx: UTI (urinary tract infection)   . Hypertension   . Inflammation of joint of shoulder region 01/15/2017  . Lumbar central spinal stenosis (L4-5) 07/30/2017  . Lumbar disc protrusion (Left: L5-S1) (Right: L1-2) 07/30/2017   L5-S1 left foraminal protrusion with L5 impingement. L1-2 right paracentral protrusion without impingement.  . Lumbar facet arthropathy (Bilateral) 07/30/2017  . Lumbar facet syndrome (Bilateral) (R>L) 07/30/2017  . Lung cancer ([place])   . Osteoarthritis of shoulder (Right) 01/15/2017     Past Surgical History:  Procedure Laterality Date  . ABDOMINAL HYSTERECTOMY    . APPENDECTOMY    . BREAST REDUCTION SURGERY Bilateral   . COLONOSCOPY WITH PROPOFOL N/A 11/05/2018   Procedure: COLONOSCOPY WITH PROPOFOL;  Surgeon: Virgel Manifold, MD;  Location: [facility] ENDOSCOPY;  Service: Endoscopy;  Laterality: N/A;  . COLONOSCOPY WITH PROPOFOL N/A 11/06/2018   Procedure: COLONOSCOPY WITH PROPOFOL;  Surgeon: Virgel Manifold, MD;  Location: [facility] ENDOSCOPY;  Service: Endoscopy;  Laterality: N/A;  . COLOSTOMY    . DILATION AND CURETTAGE OF UTERUS    . ESOPHAGOGASTRODUODENOSCOPY (EGD) WITH PROPOFOL N/A 11/05/2018   Procedure: ESOPHAGOGASTRODUODENOSCOPY (EGD) WITH PROPOFOL;  Surgeon: Virgel Manifold, MD;   Location: [facility] ENDOSCOPY;  Service: Endoscopy;  Laterality: N/A;  . ESOPHAGOGASTRODUODENOSCOPY (EGD) WITH PROPOFOL N/A 03/27/2019   Procedure: ESOPHAGOGASTRODUODENOSCOPY (EGD) WITH PROPOFOL;  Surgeon: Lucilla Lame, MD;  Location: [facility] ENDOSCOPY;  Service: Endoscopy;  Laterality: N/A;  . ILEOSTOMY Right 11/27/2018   Procedure: ILEOSTOMY;  Surgeon: Jules Husbands, MD;  Location: [facility] ORS;  Service: General;  Laterality: Right;  . LAPAROSCOPIC SIGMOID COLECTOMY N/A 11/27/2018   Procedure: LAPAROSCOPIC SIGMOID COLECTOMY;  Surgeon: Jules Husbands, MD;  Location: [facility] ORS;  Service: General;  Laterality: N/A;  . ORIF ANKLE FRACTURE Right 11/21/2018   Procedure: OPEN REDUCTION INTERNAL FIXATION (ORIF) ANKLE FRACTURE;  Surgeon: Corky Mull, MD;  Location: [facility] ORS;  Service: Orthopedics;  Laterality: Right;  . PORTACATH PLACEMENT N/A 03/12/2019   Procedure: INSERTION PORT-A-CATH;  Surgeon: Jules Husbands, MD;  Location: [facility] ORS;  Service: General;  Laterality: N/A;  . REDUCTION MAMMAPLASTY    . SHOULDER ARTHROSCOPY WITH OPEN ROTATOR CUFF REPAIR AND DISTAL CLAVICLE ACROMINECTOMY Right 03/26/2017   Procedure: right shoulder arthroscopy, arthroscopic subacromial decompression, distal clavicle excision, mini open rotator cuff repair;  Surgeon: Thornton Park, MD;  Location: [facility] ORS;  Service: Orthopedics;  Laterality: Right;    Social History   Socioeconomic History  . Marital status: Divorced    Spouse name: Not on file  . Number of children: 3  . Years of education: Not on file  . Highest education level: 11th grade  Occupational History  . Not on file  Social Needs  . Financial resource strain: Somewhat hard  . Food insecurity    Worry: Sometimes true    Inability: Never true  . Transportation needs  Medical: Yes    Non-medical: No  Tobacco Use  . Smoking status: Former Smoker    Packs/day: 1.00    Types: Cigarettes    Quit date: 2010    Years since quitting: 10.5  . Smokeless  tobacco: Never Used  . Tobacco comment: quit over 30 years ago   Substance and Sexual Activity  . Alcohol use: No  . Drug use: Yes    Types: Marijuana  . Sexual activity: Never  Lifestyle  . Physical activity    Days per week: 0 days    Minutes per session: 0 min  . Stress: Not at all  Relationships  . Social Herbalist on phone: Never    Gets together: Never    Attends religious service: More than 4 times per year    Active member of club or organization: No    Attends meetings of clubs or organizations: Never    Relationship status: Divorced  . Intimate partner violence    Fear of current or ex partner: No    Emotionally abused: No    Physically abused: No    Forced sexual activity: No  Other Topics Concern  . 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 She receives disability. She has 3 children and several grandchildren. She lives alone. She drives. Uses a walker and shower chair at baseline.     Family History  Problem Relation Age of Onset  . Hypertension Mother   . Breast cancer Mother 40  . Breast cancer Sister 23     Current Outpatient Medications:  .  albuterol (VENTOLIN HFA) 108 (90 Base) MCG/ACT inhaler, Inhale 2 puffs into the lungs every 6 (six) hours as needed for wheezing or shortness of breath., Disp: 18 g, Rfl: 0 .  amLODipine (NORVASC) 10 MG tablet, Take 1 tablet (10 mg total) by mouth daily., Disp: 30 tablet, Rfl: 0 .  BREO ELLIPTA 200-25 MCG/INH AEPB, Inhale 1 puff into the lungs daily., Disp: 60 each, Rfl: 0 .  clonazePAM (KLONOPIN) 0.5 MG tablet, Take 1 tablet (0.5 mg total) by mouth 3 (three) times daily., Disp: 30 tablet, Rfl: 0 .  cloNIDine (CATAPRES) 0.2 MG tablet, Take 1 tablet (0.2 mg total) by mouth 2 (two) times daily., Disp: 60 tablet, Rfl: 0 .  hydrALAZINE (APRESOLINE) 100 MG tablet, Take 1 tablet (100 mg total) by mouth 3 (three) times daily., Disp: 90 tablet, Rfl: 0 .  hydrOXYzine  (ATARAX/VISTARIL) 10 MG tablet, Take 1 tablet (10 mg total) by mouth 3 (three) times daily as needed for itching., Disp: 30 tablet, Rfl: 0 .  ibuprofen (ADVIL) 800 MG tablet, Take 1 tablet (800 mg total) by mouth every 8 (eight) hours as needed., Disp: 20 tablet, Rfl: 0 .  loperamide (IMODIUM) 2 MG capsule, Take 2 capsules (4 mg total) by mouth 3 (three) times daily., Disp: 180 capsule, Rfl: 0 .  lovastatin (MEVACOR) 40 MG tablet, Take 1 tablet (40 mg total) by mouth at bedtime., Disp: 30 tablet, Rfl: 0 .  meclizine (ANTIVERT) 12.5 MG tablet, Take 1 tablet (12.5 mg total) by mouth 3 (three) times daily as needed for dizziness., Disp: 30 tablet, Rfl: 0 .  megestrol (MEGACE) 400 MG/10ML suspension, Take 10 mLs (400 mg total) by mouth daily for 30 doses., Disp: 480 mL, Rfl: 2 .  methocarbamol (ROBAXIN) 500 MG tablet, Take 1 tablet (500 mg total) by mouth at bedtime as  needed for muscle spasms., Disp: 30 tablet, Rfl: 0 .  metoprolol succinate (TOPROL-XL) 100 MG 24 hr tablet, Take 1 tablet (100 mg total) by mouth daily. Take with or immediately following a meal., Disp: 30 tablet, Rfl: 0 .  mirtazapine (REMERON) 15 MG tablet, take 1 tablet by mouth at bedtime for APPETITIE, Disp: 30 tablet, Rfl: 0 .  montelukast (SINGULAIR) 10 MG tablet, Take 1 tablet (10 mg total) by mouth at bedtime as needed., Disp: 30 tablet, Rfl: 0 .  nystatin (MYCOSTATIN) 100000 UNIT/ML suspension, Take 5 mLs (500,000 Units total) by mouth 4 (four) times daily., Disp: 60 mL, Rfl: 0 .  OLANZapine (ZYPREXA) 10 MG tablet, Take 1 tablet (10 mg total) by mouth at bedtime., Disp: 30 tablet, Rfl: 0 .  omeprazole (PRILOSEC) 40 MG capsule, Take 1 capsule (40 mg total) by mouth 2 (two) times daily., Disp: 60 capsule, Rfl: 0 .  ondansetron (ZOFRAN) 8 MG tablet, Take 1 tablet (8 mg total) by mouth 2 (two) times daily as needed for refractory nausea / vomiting. Start on day 3 after carboplatin chemo., Disp: 30 tablet, Rfl: 0 .  oxybutynin  (DITROPAN) 5 MG tablet, Take 1 tablet (5 mg total) by mouth 2 (two) times daily., Disp: 60 tablet, Rfl: 0 .  oxyCODONE-acetaminophen (PERCOCET/ROXICET) 5-325 MG tablet, Take 1 tablet by mouth every 6 (six) hours as needed for severe pain., Disp: 28 tablet, Rfl: 0 .  Potassium 99 MG TABS, Take 1 tablet (99 mg total) by mouth daily after breakfast., Disp: 30 tablet, Rfl: 0 .  potassium chloride SA (K-DUR) 20 MEQ tablet, Take 1 tablet (20 mEq total) by mouth daily., Disp: 20 tablet, Rfl: 0 .  prochlorperazine (COMPAZINE) 10 MG tablet, Take 1 tablet (10 mg total) by mouth every 6 (six) hours as needed (Nausea or vomiting)., Disp: 30 tablet, Rfl: 0 .  sertraline (ZOLOFT) 100 MG tablet, Take 2 tablets (200 mg total) by mouth at bedtime., Disp: 60 tablet, Rfl: 0 .  terazosin (HYTRIN) 1 MG capsule, Take 1 capsule (1 mg total) by mouth at bedtime., Disp: 30 capsule, Rfl: 0 .  tiZANidine (ZANAFLEX) 2 MG tablet, Take 1 tablet (2 mg total) by mouth every 6 (six) hours as needed for muscle spasms., Disp: 30 tablet, Rfl: 0 .  traZODone (DESYREL) 50 MG tablet, Take 0.5-1 tablets (25-50 mg total) by mouth at bedtime as needed for sleep., Disp: 30 tablet, Rfl: 0 .  triamcinolone cream (KENALOG) 0.1 %, Apply 1 application topically 2 (two) times daily as needed (itching)., Disp: 15 g, Rfl: 0 No current facility-administered medications for this visit.   Facility-Administered Medications Ordered in Other Visits:  .  sodium chloride flush (NS) 0.9 % injection 10 mL, 10 mL, Intravenous, PRN, Verlon Au, NP, 10 mL at 04/13/19 1430  Physical exam:  Vitals:   04/17/19 0908  BP: (!) 144/75  Pulse: 68  Resp: 16  Temp: (!) 95.7 F (35.4 C)  TempSrc: Tympanic  Weight: 151 lb 12.8 oz (68.9 kg)  Height: 5\' 5"  (1.651 m)   Physical Exam Constitutional:      Comments: Appears in no acute distress  HENT:     Head: Normocephalic and atraumatic.  Eyes:     Pupils: Pupils are equal, round, and reactive to light.   Neck:     Musculoskeletal: Normal range of motion.  Cardiovascular:     Rate and Rhythm: Normal rate and regular rhythm.     Heart sounds: Normal heart sounds.  Pulmonary:     Effort: Pulmonary effort is normal.     Breath sounds: Normal breath sounds.  Abdominal:     General: Bowel sounds are normal.     Palpations: Abdomen is soft.     Comments: Colostomy in place  Skin:    General: Skin is warm and dry.  Neurological:     Mental Status: She is alert and oriented to person, place, and time.      CMP Latest Ref Rng & Units 04/17/2019  Glucose 70 - 99 mg/dL 106(H)  BUN 8 - 23 mg/dL 11  Creatinine 0.44 - 1.00 mg/dL 0.74  Sodium 135 - 145 mmol/L 139  Potassium 3.5 - 5.1 mmol/L 3.4(L)  Chloride 98 - 111 mmol/L 107  CO2 22 - 32 mmol/L 23  Calcium 8.9 - 10.3 mg/dL 9.1  Total Protein 6.5 - 8.1 g/dL 6.6  Total Bilirubin 0.3 - 1.2 mg/dL 0.4  Alkaline Phos 38 - 126 U/L 87  AST 15 - 41 U/L 17  ALT 0 - 44 U/L 20   CBC Latest Ref Rng & Units 04/17/2019  WBC 4.0 - 10.5 K/uL 3.0(L)  Hemoglobin 12.0 - 15.0 g/dL 7.4(L)  Hematocrit 36.0 - 46.0 % 23.1(L)  Platelets 150 - 400 K/uL 116(L)    No images are attached to the encounter.  Ct Chest W Contrast  Result Date: 03/24/2019 CLINICAL DATA:  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 Diagnosis May 2020. Currently undergoing chemotherapy and radiation. EXAM: CT CHEST, ABDOMEN, AND PELVIS WITH CONTRAST TECHNIQUE: Multidetector CT imaging of the chest, abdomen and pelvis was performed following the standard protocol during bolus administration of intravenous contrast. CONTRAST:  174mL OMNIPAQUE IOHEXOL 300 MG/ML  SOLN COMPARISON:  PET-CT 02/05/2019 FINDINGS: CT CHEST FINDINGS Cardiovascular: The heart is normal in size. No pericardial effusion. The aorta is normal in caliber. Moderate atherosclerotic calcifications. No dissection. The branch vessels are patent. Stable coronary artery calcifications. Mediastinum/Nodes: No  mediastinal or hilar mass or lymphadenopathy. The esophagus is unremarkable. Lungs/Pleura: Stable underlying emphysematous changes. The right upper lobe pulmonary lesion measures 7.5 mm on image number 58 and previously measured 15.5 mm. Semi solid nodular left upper lobe lesion peripherally on image number 85 measures 18 mm and is unchanged. This was not hypermetabolic on the recent PET-CT but recommend continued observation/surveillance. A few tiny scattered subpleural nodules are stable and likely represent lymph nodes. No infiltrates or effusions. Musculoskeletal: No breast masses, supraclavicular or axillary adenopathy. The right-sided Port-A-Cath is stable. The thyroid gland is unremarkable. Stable small right nodule. No significant bony findings. CT ABDOMEN PELVIS FINDINGS Hepatobiliary: Stable low-attenuation lesion in segment 6 most consistent with a benign hepatic cyst. No worrisome hepatic lesions or intrahepatic biliary dilatation. The gallbladder is surgically absent. Mild associated common bile duct dilatation. Pancreas: No mass, inflammation or ductal dilatation. Spleen: Normal size.  No focal lesions. Adrenals/Urinary Tract: The adrenal glands are normal in stable. No renal, ureteral or bladder calculi or mass. Stomach/Bowel: The stomach, duodenum, small bowel and colon are unremarkable. No acute inflammatory changes, mass lesions or obstructive findings. Patient has a loop ileostomy in the right lower quadrant. Prior left hemicolectomy with transverse colon to rectal anastomosis. There is dense contrast in the rectum with moderate artifact but no findings suspicious for residual or recurrent tumor. Vascular/Lymphatic: Stable aortic and iliac artery calcifications but no aneurysm. The major venous structures are patent. No mesenteric or retroperitoneal mass or adenopathy. No pelvic lymphadenopathy. Reproductive: The uterus is surgically  absent. Both ovaries are still present and appear normal. There  is a simple appearing cyst associated with the left ovary. Other: No free pelvic fluid collections or pelvic abscess. No inguinal mass or adenopathy. No subcutaneous lesions. Musculoskeletal: No significant bony findings. IMPRESSION: 1. Interval decrease in size of the right upper lobe pulmonary nodule now measuring 7.5 mm and previously measuring 15.5 mm. 2. Stable 18 mm semi-solid airspace nodule in the left upper lobe. Recommend continued surveillance. 3. No mediastinal or hilar mass or adenopathy. 4. Stable surgical changes involving the colon. No findings suspicious for recurrent tumor, locoregional adenopathy or distant metastatic disease. Electronically Signed   By: Marijo Sanes M.D.   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 Diagnosis May 2020. Currently undergoing chemotherapy and radiation. EXAM: CT CHEST, ABDOMEN, AND PELVIS WITH CONTRAST TECHNIQUE: Multidetector CT imaging of the chest, abdomen and pelvis was performed following the standard protocol during bolus administration of intravenous contrast. CONTRAST:  139mL OMNIPAQUE IOHEXOL 300 MG/ML  SOLN COMPARISON:  PET-CT 02/05/2019 FINDINGS: CT CHEST FINDINGS Cardiovascular: The heart is normal in size. No pericardial effusion. The aorta is normal in caliber. Moderate atherosclerotic calcifications. No dissection. The branch vessels are patent. Stable coronary artery calcifications. Mediastinum/Nodes: No mediastinal or hilar mass or lymphadenopathy. The esophagus is unremarkable. Lungs/Pleura: Stable underlying emphysematous changes. The right upper lobe pulmonary lesion measures 7.5 mm on image number 58 and previously measured 15.5 mm. Semi solid nodular left upper lobe lesion peripherally on image number 85 measures 18 mm and is unchanged. This was not hypermetabolic on the recent PET-CT but recommend continued  observation/surveillance. A few tiny scattered subpleural nodules are stable and likely represent lymph nodes. No infiltrates or effusions. Musculoskeletal: No breast masses, supraclavicular or axillary adenopathy. The right-sided Port-A-Cath is stable. The thyroid gland is unremarkable. Stable small right nodule. No significant bony findings. CT ABDOMEN PELVIS FINDINGS Hepatobiliary: Stable low-attenuation lesion in segment 6 most consistent with a benign hepatic cyst. No worrisome hepatic lesions or intrahepatic biliary dilatation. The gallbladder is surgically absent. Mild associated common bile duct dilatation. Pancreas: No mass, inflammation or ductal dilatation. Spleen: Normal size.  No focal lesions. Adrenals/Urinary Tract: The adrenal glands are normal in stable. No renal, ureteral or bladder calculi or mass. Stomach/Bowel: The stomach, duodenum, small bowel and colon are unremarkable. No acute inflammatory changes, mass lesions or obstructive findings. Patient has a loop ileostomy in the right lower quadrant. Prior left hemicolectomy with transverse colon to rectal anastomosis. There is dense contrast in the rectum with moderate artifact but no findings suspicious for residual or recurrent tumor. Vascular/Lymphatic: Stable aortic and iliac artery calcifications but no aneurysm. The major venous structures are patent. No mesenteric or retroperitoneal mass or adenopathy. No pelvic lymphadenopathy. Reproductive: The uterus is surgically absent. Both ovaries are still present and appear normal. There is a simple appearing cyst associated with the left ovary. Other: No free pelvic fluid collections or pelvic abscess. No inguinal mass or adenopathy. No subcutaneous lesions. Musculoskeletal: No significant bony findings. IMPRESSION: 1. Interval decrease in size of the right upper lobe pulmonary nodule now measuring 7.5 mm and previously measuring 15.5 mm. 2. Stable 18 mm semi-solid airspace nodule in the left  upper lobe. Recommend continued surveillance. 3. No mediastinal or hilar mass or adenopathy. 4. Stable surgical changes involving the colon. No findings suspicious for recurrent tumor, locoregional adenopathy or distant  metastatic disease. Electronically Signed   By: Marijo Sanes M.D.   On: 03/24/2019 15:26     Assessment and plan- Patient is a 73 y.o. female with history of stage III colon cancer status post resection(did not receive adjuvant chemotherapy).Then had new diagnosis of limited stage small cell lung cancer and is status post 2 cycles of carboplatin and etoposide.  He is here for routine follow-up after 2 cycles of chemo to assess tolerance and possible IV fluids  1.  Patient feels fatigued but overall has been keeping up with her oral intake.  We will hold off on giving her IV fluids today and tentatively schedule her for 1 L of IV fluids on 04/21/2019 along with 10 mg of IV Decadron  2.  Mild hypokalemia: We will renew her oral potassium at this time  3.  Chemo-induced anemia: Counts are likely at its nadir today and hopefully would recover in the next 2 weeks when she comes back for her next treatment.  Hold off on blood transfusion at this time continue to monitor  4.  Back pain: Chronic likely musculoskeletal.  No etiology was apparent on recent CT abdomen.  Continue to monitor and I will consider referring her to orthopedic surgery after completion of chemoradiation  I will see her back on 04/28/2019 with port labs: CBC with differential, CMP to receive cycle 3 of carboplatin and etoposide and since she would have been done with her radiation at that time I will plan to give her on pro Neulasta on day 3 of etoposide     Visit Diagnosis 1. Small cell lung cancer ([place])   2. Antineoplastic chemotherapy induced anemia   3. Hypokalemia      Dr. Randa Evens, MD, MPH [facility] [phone] 04/17/2019 11:02 AM

## 2019-04-20 ENCOUNTER — Other Ambulatory Visit: Payer: Self-pay

## 2019-04-20 ENCOUNTER — Ambulatory Visit: Payer: Medicare Other

## 2019-04-20 ENCOUNTER — Ambulatory Visit
Admission: RE | Admit: 2019-04-20 | Discharge: 2019-04-20 | Disposition: A | Discharge status: Home / Self Care | Payer: Medicare Other | Source: Ambulatory Visit | Attending: Radiation Oncology | Admitting: Radiation Oncology

## 2019-04-20 DIAGNOSIS — Z87891 Personal history of nicotine dependence: Secondary | ICD-10-CM | POA: Diagnosis not present

## 2019-04-20 DIAGNOSIS — M4802 Spinal stenosis, cervical region: Secondary | ICD-10-CM | POA: Diagnosis not present

## 2019-04-20 DIAGNOSIS — M5136 Other intervertebral disc degeneration, lumbar region: Secondary | ICD-10-CM | POA: Diagnosis not present

## 2019-04-20 DIAGNOSIS — M19011 Primary osteoarthritis, right shoulder: Secondary | ICD-10-CM | POA: Diagnosis not present

## 2019-04-20 DIAGNOSIS — Z85038 Personal history of other malignant neoplasm of large intestine: Secondary | ICD-10-CM | POA: Diagnosis not present

## 2019-04-20 DIAGNOSIS — M4810 Ankylosing hyperostosis [Forestier], site unspecified: Secondary | ICD-10-CM | POA: Diagnosis not present

## 2019-04-20 DIAGNOSIS — I10 Essential (primary) hypertension: Secondary | ICD-10-CM | POA: Diagnosis not present

## 2019-04-20 DIAGNOSIS — K219 Gastro-esophageal reflux disease without esophagitis: Secondary | ICD-10-CM | POA: Diagnosis not present

## 2019-04-20 DIAGNOSIS — M5030 Other cervical disc degeneration, unspecified cervical region: Secondary | ICD-10-CM | POA: Diagnosis not present

## 2019-04-20 DIAGNOSIS — C3400 Malignant neoplasm of unspecified main bronchus: Secondary | ICD-10-CM | POA: Diagnosis not present

## 2019-04-20 DIAGNOSIS — M533 Sacrococcygeal disorders, not elsewhere classified: Secondary | ICD-10-CM | POA: Diagnosis not present

## 2019-04-20 DIAGNOSIS — M4316 Spondylolisthesis, lumbar region: Secondary | ICD-10-CM | POA: Diagnosis not present

## 2019-04-20 DIAGNOSIS — Z510 Encounter for antineoplastic radiation therapy: Secondary | ICD-10-CM | POA: Diagnosis not present

## 2019-04-20 DIAGNOSIS — Z433 Encounter for attention to colostomy: Secondary | ICD-10-CM | POA: Diagnosis not present

## 2019-04-20 DIAGNOSIS — C3411 Malignant neoplasm of upper lobe, right bronchus or lung: Secondary | ICD-10-CM | POA: Diagnosis not present

## 2019-04-20 DIAGNOSIS — G8929 Other chronic pain: Secondary | ICD-10-CM | POA: Diagnosis not present

## 2019-04-20 DIAGNOSIS — J45909 Unspecified asthma, uncomplicated: Secondary | ICD-10-CM | POA: Diagnosis not present

## 2019-04-20 DIAGNOSIS — Z87440 Personal history of urinary (tract) infections: Secondary | ICD-10-CM | POA: Diagnosis not present

## 2019-04-21 ENCOUNTER — Ambulatory Visit: Payer: Medicare Other

## 2019-04-21 ENCOUNTER — Ambulatory Visit
Admission: RE | Admit: 2019-04-21 | Discharge: 2019-04-21 | Disposition: A | Discharge status: Home / Self Care | Payer: Medicare Other | Source: Ambulatory Visit | Attending: Radiation Oncology | Admitting: Radiation Oncology

## 2019-04-21 ENCOUNTER — Inpatient Hospital Stay: Payer: Medicare Other

## 2019-04-21 ENCOUNTER — Other Ambulatory Visit: Payer: Self-pay

## 2019-04-21 ENCOUNTER — Other Ambulatory Visit: Payer: Self-pay | Admitting: Oncology

## 2019-04-21 VITALS — BP 102/68 | HR 62 | Temp 97.3°F | Resp 17

## 2019-04-21 DIAGNOSIS — Z5112 Encounter for antineoplastic immunotherapy: Secondary | ICD-10-CM | POA: Diagnosis not present

## 2019-04-21 DIAGNOSIS — E876 Hypokalemia: Secondary | ICD-10-CM | POA: Diagnosis not present

## 2019-04-21 DIAGNOSIS — G8929 Other chronic pain: Secondary | ICD-10-CM | POA: Diagnosis not present

## 2019-04-21 DIAGNOSIS — R05 Cough: Secondary | ICD-10-CM | POA: Diagnosis not present

## 2019-04-21 DIAGNOSIS — R110 Nausea: Secondary | ICD-10-CM | POA: Diagnosis not present

## 2019-04-21 DIAGNOSIS — D6481 Anemia due to antineoplastic chemotherapy: Secondary | ICD-10-CM | POA: Diagnosis not present

## 2019-04-21 DIAGNOSIS — Z510 Encounter for antineoplastic radiation therapy: Secondary | ICD-10-CM | POA: Diagnosis not present

## 2019-04-21 DIAGNOSIS — C3490 Malignant neoplasm of unspecified part of unspecified bronchus or lung: Secondary | ICD-10-CM

## 2019-04-21 DIAGNOSIS — Z5189 Encounter for other specified aftercare: Secondary | ICD-10-CM | POA: Diagnosis not present

## 2019-04-21 DIAGNOSIS — Z85038 Personal history of other malignant neoplasm of large intestine: Secondary | ICD-10-CM | POA: Diagnosis not present

## 2019-04-21 DIAGNOSIS — Z87891 Personal history of nicotine dependence: Secondary | ICD-10-CM | POA: Diagnosis not present

## 2019-04-21 DIAGNOSIS — C3411 Malignant neoplasm of upper lobe, right bronchus or lung: Secondary | ICD-10-CM | POA: Diagnosis not present

## 2019-04-21 MED ORDER — SODIUM CHLORIDE 0.9 % IV SOLN
Freq: Once | INTRAVENOUS | Status: AC
  Administered 2019-04-21: 15:00:00 via INTRAVENOUS
  Filled 2019-04-21: qty 250

## 2019-04-21 MED ORDER — HEPARIN SOD (PORK) LOCK FLUSH 100 UNIT/ML IV SOLN
500.0000 [IU] | Freq: Once | INTRAVENOUS | Status: AC
  Administered 2019-04-21: 16:00:00 500 [IU] via INTRAVENOUS

## 2019-04-21 MED ORDER — HEPARIN SOD (PORK) LOCK FLUSH 100 UNIT/ML IV SOLN
INTRAVENOUS | Status: AC
  Filled 2019-04-21: qty 5

## 2019-04-21 NOTE — Progress Notes (Signed)
Patient receiving IV fluids and infusion today requesting pain medication for mouth pain.  I was asked to assess patient.  Mild plaque present on patient's tongue.  No evidence of lesions.  Patient has history of thrush in the past due to chemotherapy.  States she does not have any mouthwash at this time.  Will resend Magic mouthwash to her pharmacy.  RX magic mouthwash with lidocaine.   I will touch base with patient tomorrow to see if symptoms have improved.  Faythe Casa, NP 04/21/2019 3:55 PM

## 2019-04-21 NOTE — Progress Notes (Signed)
Pt to receive IVFs and no Decadron today per Dr. Janese Banks. Pt states she is having dizziness with position changes, pt reports drinking "plenty of fluids".   Pt reports "sore/white" tougnue since 04/19/19 and pt reports starting her mouthwash 04/20/2019 with no relief. MD aware. Sonia Baller NP will come to infusion to assess.

## 2019-04-22 ENCOUNTER — Ambulatory Visit: Payer: Medicare Other

## 2019-04-22 ENCOUNTER — Ambulatory Visit
Admission: RE | Admit: 2019-04-22 | Discharge: 2019-04-22 | Disposition: A | Discharge status: Home / Self Care | Payer: Medicare Other | Source: Ambulatory Visit | Attending: Radiation Oncology | Admitting: Radiation Oncology

## 2019-04-22 ENCOUNTER — Other Ambulatory Visit: Payer: Self-pay

## 2019-04-22 DIAGNOSIS — Z510 Encounter for antineoplastic radiation therapy: Secondary | ICD-10-CM | POA: Diagnosis not present

## 2019-04-22 DIAGNOSIS — C3411 Malignant neoplasm of upper lobe, right bronchus or lung: Secondary | ICD-10-CM | POA: Diagnosis not present

## 2019-04-22 DIAGNOSIS — Z87891 Personal history of nicotine dependence: Secondary | ICD-10-CM | POA: Diagnosis not present

## 2019-04-23 ENCOUNTER — Ambulatory Visit: Payer: Medicare Other

## 2019-04-23 ENCOUNTER — Other Ambulatory Visit: Payer: Self-pay

## 2019-04-23 ENCOUNTER — Ambulatory Visit
Admission: RE | Admit: 2019-04-23 | Discharge: 2019-04-23 | Disposition: A | Discharge status: Home / Self Care | Payer: Medicare Other | Source: Ambulatory Visit | Attending: Radiation Oncology | Admitting: Radiation Oncology

## 2019-04-23 DIAGNOSIS — Z87891 Personal history of nicotine dependence: Secondary | ICD-10-CM | POA: Diagnosis not present

## 2019-04-23 DIAGNOSIS — Z510 Encounter for antineoplastic radiation therapy: Secondary | ICD-10-CM | POA: Diagnosis not present

## 2019-04-23 DIAGNOSIS — M19011 Primary osteoarthritis, right shoulder: Secondary | ICD-10-CM | POA: Diagnosis not present

## 2019-04-23 DIAGNOSIS — M4810 Ankylosing hyperostosis [Forestier], site unspecified: Secondary | ICD-10-CM | POA: Diagnosis not present

## 2019-04-23 DIAGNOSIS — I10 Essential (primary) hypertension: Secondary | ICD-10-CM | POA: Diagnosis not present

## 2019-04-23 DIAGNOSIS — Z87440 Personal history of urinary (tract) infections: Secondary | ICD-10-CM | POA: Diagnosis not present

## 2019-04-23 DIAGNOSIS — G8929 Other chronic pain: Secondary | ICD-10-CM | POA: Diagnosis not present

## 2019-04-23 DIAGNOSIS — Z433 Encounter for attention to colostomy: Secondary | ICD-10-CM | POA: Diagnosis not present

## 2019-04-23 DIAGNOSIS — C3400 Malignant neoplasm of unspecified main bronchus: Secondary | ICD-10-CM | POA: Diagnosis not present

## 2019-04-23 DIAGNOSIS — M4316 Spondylolisthesis, lumbar region: Secondary | ICD-10-CM | POA: Diagnosis not present

## 2019-04-23 DIAGNOSIS — C3411 Malignant neoplasm of upper lobe, right bronchus or lung: Secondary | ICD-10-CM | POA: Diagnosis not present

## 2019-04-23 DIAGNOSIS — M4802 Spinal stenosis, cervical region: Secondary | ICD-10-CM | POA: Diagnosis not present

## 2019-04-23 DIAGNOSIS — M5030 Other cervical disc degeneration, unspecified cervical region: Secondary | ICD-10-CM | POA: Diagnosis not present

## 2019-04-23 DIAGNOSIS — M5136 Other intervertebral disc degeneration, lumbar region: Secondary | ICD-10-CM | POA: Diagnosis not present

## 2019-04-23 DIAGNOSIS — M533 Sacrococcygeal disorders, not elsewhere classified: Secondary | ICD-10-CM | POA: Diagnosis not present

## 2019-04-23 DIAGNOSIS — Z85038 Personal history of other malignant neoplasm of large intestine: Secondary | ICD-10-CM | POA: Diagnosis not present

## 2019-04-23 DIAGNOSIS — K219 Gastro-esophageal reflux disease without esophagitis: Secondary | ICD-10-CM | POA: Diagnosis not present

## 2019-04-23 DIAGNOSIS — J45909 Unspecified asthma, uncomplicated: Secondary | ICD-10-CM | POA: Diagnosis not present

## 2019-04-24 ENCOUNTER — Other Ambulatory Visit: Payer: Self-pay

## 2019-04-24 ENCOUNTER — Ambulatory Visit
Admission: RE | Admit: 2019-04-24 | Discharge: 2019-04-24 | Disposition: A | Discharge status: Home / Self Care | Payer: Medicare Other | Source: Ambulatory Visit | Attending: Radiation Oncology | Admitting: Radiation Oncology

## 2019-04-24 DIAGNOSIS — Z87891 Personal history of nicotine dependence: Secondary | ICD-10-CM | POA: Diagnosis not present

## 2019-04-24 DIAGNOSIS — C3411 Malignant neoplasm of upper lobe, right bronchus or lung: Secondary | ICD-10-CM | POA: Diagnosis not present

## 2019-04-24 DIAGNOSIS — Z510 Encounter for antineoplastic radiation therapy: Secondary | ICD-10-CM | POA: Diagnosis not present

## 2019-04-25 DIAGNOSIS — S82841S Displaced bimalleolar fracture of right lower leg, sequela: Secondary | ICD-10-CM | POA: Diagnosis not present

## 2019-04-27 ENCOUNTER — Telehealth: Payer: Self-pay | Admitting: *Deleted

## 2019-04-27 ENCOUNTER — Other Ambulatory Visit: Payer: Self-pay

## 2019-04-27 DIAGNOSIS — M4316 Spondylolisthesis, lumbar region: Secondary | ICD-10-CM | POA: Diagnosis not present

## 2019-04-27 DIAGNOSIS — Z85038 Personal history of other malignant neoplasm of large intestine: Secondary | ICD-10-CM | POA: Diagnosis not present

## 2019-04-27 DIAGNOSIS — M4802 Spinal stenosis, cervical region: Secondary | ICD-10-CM | POA: Diagnosis not present

## 2019-04-27 DIAGNOSIS — G8929 Other chronic pain: Secondary | ICD-10-CM | POA: Diagnosis not present

## 2019-04-27 DIAGNOSIS — K219 Gastro-esophageal reflux disease without esophagitis: Secondary | ICD-10-CM | POA: Diagnosis not present

## 2019-04-27 DIAGNOSIS — Z433 Encounter for attention to colostomy: Secondary | ICD-10-CM | POA: Diagnosis not present

## 2019-04-27 DIAGNOSIS — M533 Sacrococcygeal disorders, not elsewhere classified: Secondary | ICD-10-CM | POA: Diagnosis not present

## 2019-04-27 DIAGNOSIS — M5030 Other cervical disc degeneration, unspecified cervical region: Secondary | ICD-10-CM | POA: Diagnosis not present

## 2019-04-27 DIAGNOSIS — J45909 Unspecified asthma, uncomplicated: Secondary | ICD-10-CM | POA: Diagnosis not present

## 2019-04-27 DIAGNOSIS — M19011 Primary osteoarthritis, right shoulder: Secondary | ICD-10-CM | POA: Diagnosis not present

## 2019-04-27 DIAGNOSIS — I10 Essential (primary) hypertension: Secondary | ICD-10-CM | POA: Diagnosis not present

## 2019-04-27 DIAGNOSIS — M5136 Other intervertebral disc degeneration, lumbar region: Secondary | ICD-10-CM | POA: Diagnosis not present

## 2019-04-27 DIAGNOSIS — Z87440 Personal history of urinary (tract) infections: Secondary | ICD-10-CM | POA: Diagnosis not present

## 2019-04-27 DIAGNOSIS — C3400 Malignant neoplasm of unspecified main bronchus: Secondary | ICD-10-CM | POA: Diagnosis not present

## 2019-04-27 DIAGNOSIS — M4810 Ankylosing hyperostosis [Forestier], site unspecified: Secondary | ICD-10-CM | POA: Diagnosis not present

## 2019-04-27 NOTE — Telephone Encounter (Signed)
I will discuss this tomorrow at my office visit

## 2019-04-27 NOTE — Telephone Encounter (Signed)
Danae Chen, nurse case manager with [facility] called reporting that patient is having back pain not controlled by Tramadol and asking if we can give patient something stronger for pain. Please advise

## 2019-04-28 ENCOUNTER — Ambulatory Visit
Admission: RE | Admit: 2019-04-28 | Discharge: 2019-04-28 | Disposition: A | Discharge status: Home / Self Care | Payer: Medicare Other | Source: Ambulatory Visit | Attending: Oncology | Admitting: Oncology

## 2019-04-28 ENCOUNTER — Other Ambulatory Visit: Payer: Self-pay

## 2019-04-28 ENCOUNTER — Inpatient Hospital Stay: Payer: Medicare Other

## 2019-04-28 ENCOUNTER — Other Ambulatory Visit: Payer: Self-pay | Admitting: *Deleted

## 2019-04-28 ENCOUNTER — Inpatient Hospital Stay (HOSPITAL_BASED_OUTPATIENT_CLINIC_OR_DEPARTMENT_OTHER): Payer: Medicare Other | Admitting: Oncology

## 2019-04-28 VITALS — BP 149/75 | HR 73 | Temp 97.1°F | Resp 18

## 2019-04-28 VITALS — BP 127/75 | HR 70 | Temp 96.7°F | Resp 20 | Ht 65.0 in | Wt 151.4 lb

## 2019-04-28 DIAGNOSIS — D649 Anemia, unspecified: Secondary | ICD-10-CM

## 2019-04-28 DIAGNOSIS — R05 Cough: Secondary | ICD-10-CM

## 2019-04-28 DIAGNOSIS — D6481 Anemia due to antineoplastic chemotherapy: Secondary | ICD-10-CM

## 2019-04-28 DIAGNOSIS — Z5111 Encounter for antineoplastic chemotherapy: Secondary | ICD-10-CM

## 2019-04-28 DIAGNOSIS — Z85038 Personal history of other malignant neoplasm of large intestine: Secondary | ICD-10-CM

## 2019-04-28 DIAGNOSIS — R059 Cough, unspecified: Secondary | ICD-10-CM

## 2019-04-28 DIAGNOSIS — R110 Nausea: Secondary | ICD-10-CM | POA: Diagnosis not present

## 2019-04-28 DIAGNOSIS — Z5189 Encounter for other specified aftercare: Secondary | ICD-10-CM | POA: Diagnosis not present

## 2019-04-28 DIAGNOSIS — E876 Hypokalemia: Secondary | ICD-10-CM

## 2019-04-28 DIAGNOSIS — C3490 Malignant neoplasm of unspecified part of unspecified bronchus or lung: Secondary | ICD-10-CM

## 2019-04-28 DIAGNOSIS — C3411 Malignant neoplasm of upper lobe, right bronchus or lung: Secondary | ICD-10-CM

## 2019-04-28 DIAGNOSIS — Z5112 Encounter for antineoplastic immunotherapy: Secondary | ICD-10-CM | POA: Diagnosis not present

## 2019-04-28 DIAGNOSIS — G8929 Other chronic pain: Secondary | ICD-10-CM | POA: Diagnosis not present

## 2019-04-28 DIAGNOSIS — Z136 Encounter for screening for cardiovascular disorders: Secondary | ICD-10-CM | POA: Diagnosis not present

## 2019-04-28 DIAGNOSIS — R079 Chest pain, unspecified: Secondary | ICD-10-CM | POA: Diagnosis not present

## 2019-04-28 DIAGNOSIS — R0789 Other chest pain: Secondary | ICD-10-CM

## 2019-04-28 DIAGNOSIS — T451X5A Adverse effect of antineoplastic and immunosuppressive drugs, initial encounter: Secondary | ICD-10-CM

## 2019-04-28 LAB — CBC WITH DIFFERENTIAL/PLATELET
Abs Immature Granulocytes: 0.04 10*3/uL (ref 0.00–0.07)
Basophils Absolute: 0 10*3/uL (ref 0.0–0.1)
Basophils Relative: 0 %
Eosinophils Absolute: 0 10*3/uL (ref 0.0–0.5)
Eosinophils Relative: 1 %
HCT: 27.1 % — ABNORMAL LOW (ref 36.0–46.0)
Hemoglobin: 8.9 g/dL — ABNORMAL LOW (ref 12.0–15.0)
Immature Granulocytes: 1 %
Lymphocytes Relative: 18 %
Lymphs Abs: 0.8 10*3/uL (ref 0.7–4.0)
MCH: 29.6 pg (ref 26.0–34.0)
MCHC: 32.8 g/dL (ref 30.0–36.0)
MCV: 90 fL (ref 80.0–100.0)
Monocytes Absolute: 0.6 10*3/uL (ref 0.1–1.0)
Monocytes Relative: 12 %
Neutro Abs: 3.1 10*3/uL (ref 1.7–7.7)
Neutrophils Relative %: 68 %
Platelets: 249 10*3/uL (ref 150–400)
RBC: 3.01 MIL/uL — ABNORMAL LOW (ref 3.87–5.11)
RDW: 19.5 % — ABNORMAL HIGH (ref 11.5–15.5)
WBC: 4.5 10*3/uL (ref 4.0–10.5)
nRBC: 0 % (ref 0.0–0.2)

## 2019-04-28 LAB — COMPREHENSIVE METABOLIC PANEL
ALT: 18 U/L (ref 0–44)
AST: 18 U/L (ref 15–41)
Albumin: 4 g/dL (ref 3.5–5.0)
Alkaline Phosphatase: 78 U/L (ref 38–126)
Anion gap: 11 (ref 5–15)
BUN: 12 mg/dL (ref 8–23)
CO2: 20 mmol/L — ABNORMAL LOW (ref 22–32)
Calcium: 9.5 mg/dL (ref 8.9–10.3)
Chloride: 108 mmol/L (ref 98–111)
Creatinine, Ser: 0.75 mg/dL (ref 0.44–1.00)
GFR calc Af Amer: >60 mL/min (ref >60)
GFR calc non Af Amer: >60 mL/min (ref >60)
Glucose, Bld: 124 mg/dL — ABNORMAL HIGH (ref 70–99)
Potassium: 3.8 mmol/L (ref 3.5–5.1)
Sodium: 139 mmol/L (ref 135–145)
Total Bilirubin: 0.4 mg/dL (ref 0.3–1.2)
Total Protein: 7.4 g/dL (ref 6.5–8.1)

## 2019-04-28 LAB — TROPONIN I (HIGH SENSITIVITY): Troponin I (High Sensitivity): 9 ng/L (ref <18)

## 2019-04-28 MED ORDER — SODIUM CHLORIDE 0.9 % IV SOLN
100.0000 mg/m2 | Freq: Once | INTRAVENOUS | Status: AC
  Administered 2019-04-28: 13:00:00 180 mg via INTRAVENOUS
  Filled 2019-04-28: qty 9

## 2019-04-28 MED ORDER — PALONOSETRON HCL INJECTION 0.25 MG/5ML
0.2500 mg | Freq: Once | INTRAVENOUS | Status: AC
  Administered 2019-04-28: 0.25 mg via INTRAVENOUS
  Filled 2019-04-28: qty 5

## 2019-04-28 MED ORDER — SODIUM CHLORIDE 0.9 % IV SOLN
Freq: Once | INTRAVENOUS | Status: AC
  Administered 2019-04-28: 12:00:00 via INTRAVENOUS
  Filled 2019-04-28: qty 250

## 2019-04-28 MED ORDER — SODIUM CHLORIDE 0.9 % IV SOLN
Freq: Once | INTRAVENOUS | Status: AC
  Administered 2019-04-28: 11:00:00 via INTRAVENOUS
  Filled 2019-04-28: qty 250

## 2019-04-28 MED ORDER — HEPARIN SOD (PORK) LOCK FLUSH 100 UNIT/ML IV SOLN
INTRAVENOUS | Status: AC
  Filled 2019-04-28: qty 5

## 2019-04-28 MED ORDER — SODIUM CHLORIDE 0.9 % IV SOLN
323.2000 mg | Freq: Once | INTRAVENOUS | Status: AC
  Administered 2019-04-28: 12:00:00 320 mg via INTRAVENOUS
  Filled 2019-04-28: qty 32

## 2019-04-28 MED ORDER — HYDROCODONE-HOMATROPINE 5-1.5 MG/5ML PO SYRP: 5.0000 mL | ORAL_SOLUTION | Freq: Four times a day (QID) | ORAL | 0 refills | Status: DC | PRN

## 2019-04-28 MED ORDER — HEPARIN SOD (PORK) LOCK FLUSH 100 UNIT/ML IV SOLN
500.0000 [IU] | Freq: Once | INTRAVENOUS | Status: AC | PRN
  Administered 2019-04-28: 500 [IU]

## 2019-04-28 MED ORDER — DEXAMETHASONE SODIUM PHOSPHATE 10 MG/ML IJ SOLN
10.0000 mg | Freq: Once | INTRAMUSCULAR | Status: AC
  Administered 2019-04-28: 12:00:00 10 mg via INTRAVENOUS
  Filled 2019-04-28: qty 1

## 2019-04-28 NOTE — Progress Notes (Signed)
When I went to get Gloria Allen from the waiting room to collect her labs this morning she was grimacing and complaining of sharp chest pain. Vital signs stable with systolic BP in the 685'R. Sonia Baller, NP came to assess the patient and decided to check her troponin with an EKG. Dr. Janese Banks decided to proceed with chemo treatment and patient will receive a chest Xray today before going home.

## 2019-04-28 NOTE — Progress Notes (Signed)
Patient here for follow up. Reports she had episode of Chest pain while waiting for visit. Reports it occurred for approx. 15 minutes, mid chest, sharp. Pain has resolved at this time. Patient reports concerns with chronic back pain.

## 2019-04-29 ENCOUNTER — Encounter: Payer: Self-pay | Admitting: Oncology

## 2019-04-29 ENCOUNTER — Other Ambulatory Visit: Payer: Self-pay

## 2019-04-29 ENCOUNTER — Inpatient Hospital Stay: Payer: Medicare Other

## 2019-04-29 VITALS — BP 153/74 | HR 73 | Temp 96.5°F | Resp 18

## 2019-04-29 DIAGNOSIS — D6481 Anemia due to antineoplastic chemotherapy: Secondary | ICD-10-CM | POA: Diagnosis not present

## 2019-04-29 DIAGNOSIS — Z85038 Personal history of other malignant neoplasm of large intestine: Secondary | ICD-10-CM | POA: Diagnosis not present

## 2019-04-29 DIAGNOSIS — R05 Cough: Secondary | ICD-10-CM | POA: Diagnosis not present

## 2019-04-29 DIAGNOSIS — R110 Nausea: Secondary | ICD-10-CM | POA: Diagnosis not present

## 2019-04-29 DIAGNOSIS — E876 Hypokalemia: Secondary | ICD-10-CM | POA: Diagnosis not present

## 2019-04-29 DIAGNOSIS — C3490 Malignant neoplasm of unspecified part of unspecified bronchus or lung: Secondary | ICD-10-CM

## 2019-04-29 DIAGNOSIS — Z5112 Encounter for antineoplastic immunotherapy: Secondary | ICD-10-CM | POA: Diagnosis not present

## 2019-04-29 DIAGNOSIS — Z5189 Encounter for other specified aftercare: Secondary | ICD-10-CM | POA: Diagnosis not present

## 2019-04-29 DIAGNOSIS — C3411 Malignant neoplasm of upper lobe, right bronchus or lung: Secondary | ICD-10-CM | POA: Diagnosis not present

## 2019-04-29 DIAGNOSIS — G8929 Other chronic pain: Secondary | ICD-10-CM | POA: Diagnosis not present

## 2019-04-29 MED ORDER — DEXAMETHASONE SODIUM PHOSPHATE 10 MG/ML IJ SOLN
10.0000 mg | Freq: Once | INTRAMUSCULAR | Status: AC
  Administered 2019-04-29: 10 mg via INTRAVENOUS
  Filled 2019-04-29: qty 1

## 2019-04-29 MED ORDER — SODIUM CHLORIDE 0.9 % IV SOLN
Freq: Once | INTRAVENOUS | Status: AC
  Administered 2019-04-29: 11:00:00 via INTRAVENOUS
  Filled 2019-04-29: qty 250

## 2019-04-29 MED ORDER — HEPARIN SOD (PORK) LOCK FLUSH 100 UNIT/ML IV SOLN
500.0000 [IU] | Freq: Once | INTRAVENOUS | Status: AC | PRN
  Administered 2019-04-29: 500 [IU]
  Filled 2019-04-29: qty 5

## 2019-04-29 MED ORDER — SODIUM CHLORIDE 0.9 % IV SOLN
100.0000 mg/m2 | Freq: Once | INTRAVENOUS | Status: AC
  Administered 2019-04-29: 180 mg via INTRAVENOUS
  Filled 2019-04-29: qty 9

## 2019-04-29 NOTE — Progress Notes (Signed)
Hematology/Oncology Consult note [facility]  [phone] [phone]  Patient Care Team: Inc, [facility] as PCP - General Telford Nab, [place] as Registered Nurse   Name of the patient: Gloria Allen  [id number]  05/10/1946   Date of visit: 04/29/19  Diagnosis- 1.Stage III colon cancer status post surgery 2.New diagnosis of limited stage small cell lung cancer  Chief complaint/ Reason for visit- on treatment assessment prior to cycle 3 of carboplatin etoposide chemotherapy  Heme/Onc history: patient is a 73 year old African-American female who recently underwent screening colonoscopy by Dr. Bonna Gains.Colonoscopy showed villous nonobstructing large mass in the sigmoid colon 12 cm proximal to the anus. Mass was partially circumferential. Biopsy showed invasive adenocarcinoma. Patient was seen by Dr. Gretchen Short underwent hemicolectomy with lymph node sampling on 11/27/2018. Final pathology showed invasive colorectal carcinoma 3.5 cm, grade 2. Tumor invades through muscularis propria into the peri-colorectal tissue. Margins negative. Tumor deposits present, 2. 1 out of 9 lymph nodes examined was positive for malignancy. [id number].Patient was feeling poorly after surgery and she had no social support and decided against adjuvant chemotherapy.  Patient underwent CT chest to complete her staging work-up which showed 2 lung nodules one in the right upper lobe and one in the left left upper lobe.This was followed by a PET CT scan which showed with the left upper lobe nodule was not hypermetabolic.The right upper lobe lung nodule measure 1.6 cm with an SUV of 6.8. No hypermetabolic axillary mediastinal or hilar lymphadenopathy or evidence of metastatic disease elsewhere. Patient underwent CT-guided biopsy of the right upper lobe lung nodule which was consistent with small cell lung cancerpositive for CD56 and TTF-1.Patient started  concurrent chemoradiation in June 2020. Carboplatin chosen instead of cisplatin with etoposide given her age, poor social support and concern for renal dysfunction.    Interval history- she reported some chest pain which started when she had a bout of cough. It resolved by the time I saw her. Denies any chest pain or sob on exertion. She still has dry hacking cough which has not responded to tesselon pearls. Denies any fever. She has had long standing chronic low back pain  ECOG PS- 2 Pain scale- 4 Opioid associated constipation- no  Review of systems- Review of Systems  Constitutional: Positive for malaise/fatigue. Negative for chills, fever and weight loss.  HENT: Negative for congestion, ear discharge and nosebleeds.   Eyes: Negative for blurred vision.  Respiratory: Positive for cough. Negative for hemoptysis, sputum production, shortness of breath and wheezing.   Cardiovascular: Negative for chest pain, palpitations, orthopnea and claudication.  Gastrointestinal: Negative for abdominal pain, blood in stool, constipation, diarrhea, heartburn, melena, nausea and vomiting.  Genitourinary: Negative for dysuria, flank pain, frequency, hematuria and urgency.  Musculoskeletal: Negative for back pain, joint pain and myalgias.  Skin: Negative for rash.  Neurological: Negative for dizziness, tingling, focal weakness, seizures, weakness and headaches.  Endo/Heme/Allergies: Does not bruise/bleed easily.  Psychiatric/Behavioral: Negative for depression and suicidal ideas. The patient does not have insomnia.      Allergies  Allergen Reactions  . Ace Inhibitors Swelling  . Angiotensin Receptor Blockers Swelling  . Sulfa Antibiotics Itching  . Chlorthalidone Rash  . Flexeril [Cyclobenzaprine] Rash     Past Medical History:  Diagnosis Date  . Anxiety   . Arthritis   . Asthma   . Cancer ([place])   . Cervical central spinal stenosis (C3-C7) (worse at C4-5) 07/30/2017  . Cervical foraminal  stenosis (C4-5 and  C5-6) (Bilateral) (L>R) 07/30/2017  . Chronic lower extremity pain (Fourth Area of Pain) (Bilateral) (R>L) 06/11/2017  . Chronic neck pain (Primary Area of Pain) (Bilateral) (R>L) 06/11/2017  . Chronic sacroiliac joint pain (Bilateral) (R>L) 06/11/2017  . Chronic shoulder pain [facility] Area of Pain) (Right) 06/11/2017  . Chronic upper extremity pain (Fifth Area of Pain) (Bilateral) (R>L) 06/11/2017  . Colon cancer ([place])   . DDD (degenerative disc disease), cervical 07/30/2017  . DDD (degenerative disc disease), lumbar 07/30/2017  . Depression   . DISH (diffuse idiopathic skeletal hyperostosis) 07/30/2017  . Dyspnea    with exertion  . Entrapment syndrome 06/20/2017   2002 on R and 2010 on L  . Full thickness rotator cuff tear 06/12/2017  . GERD (gastroesophageal reflux disease)   . Grade 1 Anterolisthesis of L4 over L5 07/30/2017  . Headache   . Hx: UTI (urinary tract infection)   . Hypertension   . Inflammation of joint of shoulder region 01/15/2017  . Lumbar central spinal stenosis (L4-5) 07/30/2017  . Lumbar disc protrusion (Left: L5-S1) (Right: L1-2) 07/30/2017   L5-S1 left foraminal protrusion with L5 impingement. L1-2 right paracentral protrusion without impingement.  . Lumbar facet arthropathy (Bilateral) 07/30/2017  . Lumbar facet syndrome (Bilateral) (R>L) 07/30/2017  . Lung cancer ([place])   . Osteoarthritis of shoulder (Right) 01/15/2017     Past Surgical History:  Procedure Laterality Date  . ABDOMINAL HYSTERECTOMY    . APPENDECTOMY    . BREAST REDUCTION SURGERY Bilateral   . COLONOSCOPY WITH PROPOFOL N/A 11/05/2018   Procedure: COLONOSCOPY WITH PROPOFOL;  Surgeon: Virgel Manifold, MD;  Location: [facility] ENDOSCOPY;  Service: Endoscopy;  Laterality: N/A;  . COLONOSCOPY WITH PROPOFOL N/A 11/06/2018   Procedure: COLONOSCOPY WITH PROPOFOL;  Surgeon: Virgel Manifold, MD;  Location: [facility] ENDOSCOPY;  Service: Endoscopy;  Laterality: N/A;  . COLOSTOMY    .  DILATION AND CURETTAGE OF UTERUS    . ESOPHAGOGASTRODUODENOSCOPY (EGD) WITH PROPOFOL N/A 11/05/2018   Procedure: ESOPHAGOGASTRODUODENOSCOPY (EGD) WITH PROPOFOL;  Surgeon: Virgel Manifold, MD;  Location: [facility] ENDOSCOPY;  Service: Endoscopy;  Laterality: N/A;  . ESOPHAGOGASTRODUODENOSCOPY (EGD) WITH PROPOFOL N/A 03/27/2019   Procedure: ESOPHAGOGASTRODUODENOSCOPY (EGD) WITH PROPOFOL;  Surgeon: Lucilla Lame, MD;  Location: [facility] ENDOSCOPY;  Service: Endoscopy;  Laterality: N/A;  . ILEOSTOMY Right 11/27/2018   Procedure: ILEOSTOMY;  Surgeon: Jules Husbands, MD;  Location: [facility] ORS;  Service: General;  Laterality: Right;  . LAPAROSCOPIC SIGMOID COLECTOMY N/A 11/27/2018   Procedure: LAPAROSCOPIC SIGMOID COLECTOMY;  Surgeon: Jules Husbands, MD;  Location: [facility] ORS;  Service: General;  Laterality: N/A;  . ORIF ANKLE FRACTURE Right 11/21/2018   Procedure: OPEN REDUCTION INTERNAL FIXATION (ORIF) ANKLE FRACTURE;  Surgeon: Corky Mull, MD;  Location: [facility] ORS;  Service: Orthopedics;  Laterality: Right;  . PORTACATH PLACEMENT N/A 03/12/2019   Procedure: INSERTION PORT-A-CATH;  Surgeon: Jules Husbands, MD;  Location: [facility] ORS;  Service: General;  Laterality: N/A;  . REDUCTION MAMMAPLASTY    . SHOULDER ARTHROSCOPY WITH OPEN ROTATOR CUFF REPAIR AND DISTAL CLAVICLE ACROMINECTOMY Right 03/26/2017   Procedure: right shoulder arthroscopy, arthroscopic subacromial decompression, distal clavicle excision, mini open rotator cuff repair;  Surgeon: Thornton Park, MD;  Location: [facility] ORS;  Service: Orthopedics;  Laterality: Right;    Social History   Socioeconomic History  . Marital status: Divorced    Spouse name: Not on file  . Number of children: 3  . Years of education: Not on file  . Highest education level:  11th grade  Occupational History  . Not on file  Social Needs  . Financial resource strain: Somewhat hard  . Food insecurity    Worry: Sometimes true    Inability: Never true  . Transportation needs     Medical: Yes    Non-medical: No  Tobacco Use  . Smoking status: Former Smoker    Packs/day: 1.00    Types: Cigarettes    Quit date: 2010    Years since quitting: 10.5  . Smokeless tobacco: Never Used  . Tobacco comment: quit over 30 years ago   Substance and Sexual Activity  . Alcohol use: No  . Drug use: Yes    Types: Marijuana  . Sexual activity: Never  Lifestyle  . Physical activity    Days per week: 0 days    Minutes per session: 0 min  . Stress: Not at all  Relationships  . Social Herbalist on phone: Never    Gets together: Never    Attends religious service: More than 4 times per year    Active member of club or organization: No    Attends meetings of clubs or organizations: Never    Relationship status: Divorced  . Intimate partner violence    Fear of current or ex partner: No    Emotionally abused: No    Physically abused: No    Forced sexual activity: No  Other Topics Concern  . 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 She receives disability. She has 3 children and several grandchildren. She lives alone. She drives. Uses a walker and shower chair at baseline.     Family History  Problem Relation Age of Onset  . Hypertension Mother   . Breast cancer Mother 37  . Breast cancer Sister 74     Current Outpatient Medications:  .  albuterol (VENTOLIN HFA) 108 (90 Base) MCG/ACT inhaler, Inhale 2 puffs into the lungs every 6 (six) hours as needed for wheezing or shortness of breath., Disp: 18 g, Rfl: 0 .  amLODipine (NORVASC) 10 MG tablet, Take 1 tablet (10 mg total) by mouth daily., Disp: 30 tablet, Rfl: 0 .  benzonatate (TESSALON) 200 MG capsule, Take 1 capsule (200 mg total) by mouth 3 (three) times daily as needed for cough., Disp: 60 capsule, Rfl: 0 .  BREO ELLIPTA 200-25 MCG/INH AEPB, Inhale 1 puff into the lungs daily., Disp: 60 each, Rfl: 0 .  clonazePAM (KLONOPIN) 0.5 MG tablet, Take 1 tablet (0.5  mg total) by mouth 3 (three) times daily., Disp: 30 tablet, Rfl: 0 .  cloNIDine (CATAPRES) 0.2 MG tablet, Take 1 tablet (0.2 mg total) by mouth 2 (two) times daily., Disp: 60 tablet, Rfl: 0 .  hydrALAZINE (APRESOLINE) 100 MG tablet, Take 1 tablet (100 mg total) by mouth 3 (three) times daily., Disp: 90 tablet, Rfl: 0 .  hydrOXYzine (ATARAX/VISTARIL) 10 MG tablet, Take 1 tablet (10 mg total) by mouth 3 (three) times daily as needed for itching., Disp: 30 tablet, Rfl: 0 .  meclizine (ANTIVERT) 12.5 MG tablet, Take 1 tablet (12.5 mg total) by mouth 3 (three) times daily as needed for dizziness., Disp: 30 tablet, Rfl: 0 .  methocarbamol (ROBAXIN) 500 MG tablet, Take 1 tablet (500 mg total) by mouth at bedtime as needed for muscle spasms., Disp: 30 tablet, Rfl: 0 .  metoprolol succinate (TOPROL-XL) 100 MG 24 hr tablet, Take 1 tablet (100 mg total)  by mouth daily. Take with or immediately following a meal., Disp: 30 tablet, Rfl: 0 .  mirtazapine (REMERON) 15 MG tablet, take 1 tablet by mouth at bedtime for APPETITIE, Disp: 30 tablet, Rfl: 0 .  montelukast (SINGULAIR) 10 MG tablet, Take 1 tablet (10 mg total) by mouth at bedtime as needed., Disp: 30 tablet, Rfl: 0 .  OLANZapine (ZYPREXA) 10 MG tablet, Take 1 tablet (10 mg total) by mouth at bedtime., Disp: 30 tablet, Rfl: 0 .  omeprazole (PRILOSEC) 40 MG capsule, Take 1 capsule (40 mg total) by mouth 2 (two) times daily., Disp: 60 capsule, Rfl: 0 .  ondansetron (ZOFRAN) 8 MG tablet, Take 1 tablet (8 mg total) by mouth 2 (two) times daily as needed for refractory nausea / vomiting. Start on day 3 after carboplatin chemo., Disp: 30 tablet, Rfl: 0 .  oxybutynin (DITROPAN) 5 MG tablet, Take 1 tablet (5 mg total) by mouth 2 (two) times daily., Disp: 60 tablet, Rfl: 0 .  potassium chloride SA (K-DUR) 20 MEQ tablet, Take 1 tablet (20 mEq total) by mouth daily., Disp: 20 tablet, Rfl: 0 .  prochlorperazine (COMPAZINE) 10 MG tablet, Take 1 tablet (10 mg total) by mouth  every 6 (six) hours as needed (Nausea or vomiting)., Disp: 30 tablet, Rfl: 0 .  sertraline (ZOLOFT) 100 MG tablet, Take 2 tablets (200 mg total) by mouth at bedtime., Disp: 60 tablet, Rfl: 0 .  terazosin (HYTRIN) 1 MG capsule, Take 1 capsule (1 mg total) by mouth at bedtime., Disp: 30 capsule, Rfl: 0 .  tiZANidine (ZANAFLEX) 2 MG tablet, Take 1 tablet (2 mg total) by mouth every 6 (six) hours as needed for muscle spasms., Disp: 30 tablet, Rfl: 0 .  traZODone (DESYREL) 50 MG tablet, Take 0.5-1 tablets (25-50 mg total) by mouth at bedtime as needed for sleep., Disp: 30 tablet, Rfl: 0 .  triamcinolone cream (KENALOG) 0.1 %, Apply 1 application topically 2 (two) times daily as needed (itching)., Disp: 15 g, Rfl: 0 .  HYDROcodone-homatropine (HYCODAN) 5-1.5 MG/5ML syrup, Take 5 mLs by mouth every 6 (six) hours as needed for cough., Disp: 120 mL, Rfl: 0 .  loperamide (IMODIUM) 2 MG capsule, Take 2 capsules (4 mg total) by mouth 3 (three) times daily. (Patient not taking: Reported on 04/28/2019), Disp: 180 capsule, Rfl: 0 .  lovastatin (MEVACOR) 40 MG tablet, Take 1 tablet (40 mg total) by mouth at bedtime. (Patient not taking: Reported on 04/28/2019), Disp: 30 tablet, Rfl: 0 No current facility-administered medications for this visit.   Facility-Administered Medications Ordered in Other Visits:  .  etoposide (VEPESID) 180 mg in sodium chloride 0.9 % 500 mL chemo infusion, 100 mg/m2 (Treatment Plan Recorded), Intravenous, Once, Sindy Guadeloupe, MD, Last Rate: 509 mL/hr at 04/29/19 1109, 180 mg at 04/29/19 1109 .  heparin lock flush 100 unit/mL, 500 Units, Intracatheter, Once PRN, Sindy Guadeloupe, MD .  sodium chloride flush (NS) 0.9 % injection 10 mL, 10 mL, Intravenous, PRN, Verlon Au, NP, 10 mL at 04/13/19 1430  Physical exam:  Vitals:   04/28/19 1037 04/28/19 1050  BP: 127/75   Pulse: 70   Resp: 20   Temp: (!) 96.7 F (35.9 C)   TempSrc: Tympanic   SpO2: 100%   Weight:  151 lb 6.4 oz (68.7  kg)  Height:  5\' 5"  (1.651 m)   Physical Exam Constitutional:      General: She is not in acute distress. HENT:     Head: Normocephalic  and atraumatic.  Eyes:     Pupils: Pupils are equal, round, and reactive to light.  Neck:     Musculoskeletal: Normal range of motion.  Cardiovascular:     Rate and Rhythm: Normal rate and regular rhythm.     Heart sounds: Normal heart sounds.  Pulmonary:     Effort: Pulmonary effort is normal.     Comments: B/l rhonchi at the bases Abdominal:     General: Bowel sounds are normal.     Palpations: Abdomen is soft.  Skin:    General: Skin is warm and dry.  Neurological:     Mental Status: She is alert and oriented to person, place, and time.      CMP Latest Ref Rng & Units 04/28/2019  Glucose 70 - 99 mg/dL 124(H)  BUN 8 - 23 mg/dL 12  Creatinine 0.44 - 1.00 mg/dL 0.75  Sodium 135 - 145 mmol/L 139  Potassium 3.5 - 5.1 mmol/L 3.8  Chloride 98 - 111 mmol/L 108  CO2 22 - 32 mmol/L 20(L)  Calcium 8.9 - 10.3 mg/dL 9.5  Total Protein 6.5 - 8.1 g/dL 7.4  Total Bilirubin 0.3 - 1.2 mg/dL 0.4  Alkaline Phos 38 - 126 U/L 78  AST 15 - 41 U/L 18  ALT 0 - 44 U/L 18   CBC Latest Ref Rng & Units 04/28/2019  WBC 4.0 - 10.5 K/uL 4.5  Hemoglobin 12.0 - 15.0 g/dL 8.9(L)  Hematocrit 36.0 - 46.0 % 27.1(L)  Platelets 150 - 400 K/uL 249    No images are attached to the encounter.  Dg Chest 2 View  Result Date: 04/29/2019 CLINICAL DATA:  Chest pain and cough.  History of colon carcinoma EXAM: CHEST - 2 VIEW COMPARISON:  March 12, 2019 FINDINGS: Port-A-Cath tip is in the superior vena cava. No pneumothorax. Lungs are clear. The heart size and pulmonary vascularity are normal. No evident adenopathy. There are foci of degenerative change in the thoracic spine. No blastic or lytic bone lesions. Postoperative change noted in the right humeral head region. IMPRESSION: Port-A-Cath tip in superior vena cava. No pneumothorax. No edema or consolidation. No neoplastic  focus demonstrable. No adenopathy. Electronically Signed   By: Lowella Grip III M.D.   On: 04/29/2019 09:52     Assessment and plan- Patient is a 73 y.o. female with history of stage III colon cancer status post resection(did not receive adjuvant chemotherapy).Then had new diagnosis of limited stage small cell lung cancer. She is here for on treatment assessment prior to cycle 3 of carboplatin/ etoposide  1. Patient completed RT last week. She is more anemic after 2 cycles of chemotherapy. She will proceed with cycle 3 of carboplatin etoposide today with onpro neulasta support on day 3 of etoposide. I will reduce carboplatin dose to auc 4.   2. I will see her in 10 days time to see how she is doing with chemo and hold tube for possible transfusion.  3. 1L IVF today with 10 mg IV dceadron  4. Chronic low back pain- non malignant. She will need to discuss with her pcp for pain managemtn versus ortho referral  5. Cough- clinically she is hemodynamically stable. No fever, tachycardia. cxr done today shows no infiltrate. I will give her hycodan for her cough  6. Chest pain- EKG shows no changes of acute MI. It was self limited after acute bout of cough. Troponin was negative. Continue to monitor   Visit Diagnosis 1. Small cell lung cancer ([place])  2. Encounter for antineoplastic chemotherapy   3. Anemia due to antineoplastic chemotherapy   4. Cough      Dr. Randa Evens, MD, MPH [facility] at [facility] [phone] 04/29/2019 12:00 PM

## 2019-04-30 ENCOUNTER — Other Ambulatory Visit: Payer: Self-pay

## 2019-04-30 ENCOUNTER — Inpatient Hospital Stay: Payer: Medicare Other

## 2019-04-30 VITALS — BP 173/77 | HR 55 | Temp 98.1°F | Resp 20

## 2019-04-30 DIAGNOSIS — M19011 Primary osteoarthritis, right shoulder: Secondary | ICD-10-CM | POA: Diagnosis not present

## 2019-04-30 DIAGNOSIS — G8929 Other chronic pain: Secondary | ICD-10-CM | POA: Diagnosis not present

## 2019-04-30 DIAGNOSIS — M5136 Other intervertebral disc degeneration, lumbar region: Secondary | ICD-10-CM | POA: Diagnosis not present

## 2019-04-30 DIAGNOSIS — Z85038 Personal history of other malignant neoplasm of large intestine: Secondary | ICD-10-CM | POA: Diagnosis not present

## 2019-04-30 DIAGNOSIS — C3400 Malignant neoplasm of unspecified main bronchus: Secondary | ICD-10-CM | POA: Diagnosis not present

## 2019-04-30 DIAGNOSIS — D6481 Anemia due to antineoplastic chemotherapy: Secondary | ICD-10-CM | POA: Diagnosis not present

## 2019-04-30 DIAGNOSIS — Z5189 Encounter for other specified aftercare: Secondary | ICD-10-CM | POA: Diagnosis not present

## 2019-04-30 DIAGNOSIS — J45909 Unspecified asthma, uncomplicated: Secondary | ICD-10-CM | POA: Diagnosis not present

## 2019-04-30 DIAGNOSIS — Z87440 Personal history of urinary (tract) infections: Secondary | ICD-10-CM | POA: Diagnosis not present

## 2019-04-30 DIAGNOSIS — K219 Gastro-esophageal reflux disease without esophagitis: Secondary | ICD-10-CM | POA: Diagnosis not present

## 2019-04-30 DIAGNOSIS — C3411 Malignant neoplasm of upper lobe, right bronchus or lung: Secondary | ICD-10-CM | POA: Diagnosis not present

## 2019-04-30 DIAGNOSIS — M4316 Spondylolisthesis, lumbar region: Secondary | ICD-10-CM | POA: Diagnosis not present

## 2019-04-30 DIAGNOSIS — M533 Sacrococcygeal disorders, not elsewhere classified: Secondary | ICD-10-CM | POA: Diagnosis not present

## 2019-04-30 DIAGNOSIS — M5030 Other cervical disc degeneration, unspecified cervical region: Secondary | ICD-10-CM | POA: Diagnosis not present

## 2019-04-30 DIAGNOSIS — Z5112 Encounter for antineoplastic immunotherapy: Secondary | ICD-10-CM | POA: Diagnosis not present

## 2019-04-30 DIAGNOSIS — M4810 Ankylosing hyperostosis [Forestier], site unspecified: Secondary | ICD-10-CM | POA: Diagnosis not present

## 2019-04-30 DIAGNOSIS — E876 Hypokalemia: Secondary | ICD-10-CM | POA: Diagnosis not present

## 2019-04-30 DIAGNOSIS — R05 Cough: Secondary | ICD-10-CM | POA: Diagnosis not present

## 2019-04-30 DIAGNOSIS — M4802 Spinal stenosis, cervical region: Secondary | ICD-10-CM | POA: Diagnosis not present

## 2019-04-30 DIAGNOSIS — Z433 Encounter for attention to colostomy: Secondary | ICD-10-CM | POA: Diagnosis not present

## 2019-04-30 DIAGNOSIS — C3490 Malignant neoplasm of unspecified part of unspecified bronchus or lung: Secondary | ICD-10-CM

## 2019-04-30 DIAGNOSIS — I10 Essential (primary) hypertension: Secondary | ICD-10-CM | POA: Diagnosis not present

## 2019-04-30 DIAGNOSIS — R110 Nausea: Secondary | ICD-10-CM | POA: Diagnosis not present

## 2019-04-30 MED ORDER — PEGFILGRASTIM 6 MG/0.6ML ~~LOC~~ PSKT
6.0000 mg | PREFILLED_SYRINGE | Freq: Once | SUBCUTANEOUS | Status: AC
  Administered 2019-04-30: 6 mg via SUBCUTANEOUS
  Filled 2019-04-30: qty 0.6

## 2019-04-30 MED ORDER — HEPARIN SOD (PORK) LOCK FLUSH 100 UNIT/ML IV SOLN
500.0000 [IU] | Freq: Once | INTRAVENOUS | Status: AC
  Administered 2019-04-30: 500 [IU] via INTRAVENOUS

## 2019-04-30 MED ORDER — SODIUM CHLORIDE 0.9 % IV SOLN
100.0000 mg/m2 | Freq: Once | INTRAVENOUS | Status: AC
  Administered 2019-04-30: 180 mg via INTRAVENOUS
  Filled 2019-04-30: qty 9

## 2019-04-30 MED ORDER — DEXAMETHASONE SODIUM PHOSPHATE 10 MG/ML IJ SOLN
10.0000 mg | Freq: Once | INTRAMUSCULAR | Status: AC
  Administered 2019-04-30: 10 mg via INTRAVENOUS
  Filled 2019-04-30: qty 1

## 2019-04-30 MED ORDER — SODIUM CHLORIDE 0.9 % IV SOLN
Freq: Once | INTRAVENOUS | Status: AC
  Administered 2019-04-30: 10:00:00 via INTRAVENOUS
  Filled 2019-04-30: qty 250

## 2019-05-04 DIAGNOSIS — M533 Sacrococcygeal disorders, not elsewhere classified: Secondary | ICD-10-CM | POA: Diagnosis not present

## 2019-05-04 DIAGNOSIS — M4810 Ankylosing hyperostosis [Forestier], site unspecified: Secondary | ICD-10-CM | POA: Diagnosis not present

## 2019-05-04 DIAGNOSIS — M5136 Other intervertebral disc degeneration, lumbar region: Secondary | ICD-10-CM | POA: Diagnosis not present

## 2019-05-04 DIAGNOSIS — K219 Gastro-esophageal reflux disease without esophagitis: Secondary | ICD-10-CM | POA: Diagnosis not present

## 2019-05-04 DIAGNOSIS — I10 Essential (primary) hypertension: Secondary | ICD-10-CM | POA: Diagnosis not present

## 2019-05-04 DIAGNOSIS — Z87440 Personal history of urinary (tract) infections: Secondary | ICD-10-CM | POA: Diagnosis not present

## 2019-05-04 DIAGNOSIS — J45909 Unspecified asthma, uncomplicated: Secondary | ICD-10-CM | POA: Diagnosis not present

## 2019-05-04 DIAGNOSIS — M4316 Spondylolisthesis, lumbar region: Secondary | ICD-10-CM | POA: Diagnosis not present

## 2019-05-04 DIAGNOSIS — C3400 Malignant neoplasm of unspecified main bronchus: Secondary | ICD-10-CM | POA: Diagnosis not present

## 2019-05-04 DIAGNOSIS — Z433 Encounter for attention to colostomy: Secondary | ICD-10-CM | POA: Diagnosis not present

## 2019-05-04 DIAGNOSIS — M4802 Spinal stenosis, cervical region: Secondary | ICD-10-CM | POA: Diagnosis not present

## 2019-05-04 DIAGNOSIS — Z85038 Personal history of other malignant neoplasm of large intestine: Secondary | ICD-10-CM | POA: Diagnosis not present

## 2019-05-04 DIAGNOSIS — M19011 Primary osteoarthritis, right shoulder: Secondary | ICD-10-CM | POA: Diagnosis not present

## 2019-05-04 DIAGNOSIS — M5030 Other cervical disc degeneration, unspecified cervical region: Secondary | ICD-10-CM | POA: Diagnosis not present

## 2019-05-04 DIAGNOSIS — G8929 Other chronic pain: Secondary | ICD-10-CM | POA: Diagnosis not present

## 2019-05-05 ENCOUNTER — Telehealth: Payer: Self-pay | Admitting: *Deleted

## 2019-05-05 DIAGNOSIS — M545 Low back pain: Secondary | ICD-10-CM | POA: Diagnosis not present

## 2019-05-05 NOTE — Telephone Encounter (Signed)
She can come for IVF. I can see her before that

## 2019-05-05 NOTE — Telephone Encounter (Signed)
She was already ordered for 1 unit of blood vs 1 liter of fluids for friday

## 2019-05-05 NOTE — Telephone Encounter (Signed)
Patient called reporting that she has completed radiation therapy and that her throat is still raw and would like to come in for IV fluids this week if possible. Please advise, she has an appointment Friday for port labs and physician

## 2019-05-07 ENCOUNTER — Other Ambulatory Visit: Payer: Self-pay

## 2019-05-08 ENCOUNTER — Encounter: Payer: Self-pay | Admitting: Oncology

## 2019-05-08 ENCOUNTER — Inpatient Hospital Stay: Payer: Medicare Other

## 2019-05-08 ENCOUNTER — Inpatient Hospital Stay: Payer: Medicare Other | Attending: Oncology

## 2019-05-08 ENCOUNTER — Telehealth: Payer: Self-pay | Admitting: *Deleted

## 2019-05-08 ENCOUNTER — Inpatient Hospital Stay (HOSPITAL_BASED_OUTPATIENT_CLINIC_OR_DEPARTMENT_OTHER): Payer: Medicare Other | Admitting: Oncology

## 2019-05-08 ENCOUNTER — Other Ambulatory Visit: Payer: Self-pay

## 2019-05-08 VITALS — BP 117/75 | HR 88 | Temp 96.3°F | Resp 18 | Ht 65.0 in | Wt 153.9 lb

## 2019-05-08 DIAGNOSIS — C3490 Malignant neoplasm of unspecified part of unspecified bronchus or lung: Secondary | ICD-10-CM

## 2019-05-08 DIAGNOSIS — M79606 Pain in leg, unspecified: Secondary | ICD-10-CM | POA: Insufficient documentation

## 2019-05-08 DIAGNOSIS — T451X5A Adverse effect of antineoplastic and immunosuppressive drugs, initial encounter: Secondary | ICD-10-CM

## 2019-05-08 DIAGNOSIS — D6481 Anemia due to antineoplastic chemotherapy: Secondary | ICD-10-CM

## 2019-05-08 DIAGNOSIS — E860 Dehydration: Secondary | ICD-10-CM | POA: Diagnosis not present

## 2019-05-08 DIAGNOSIS — D649 Anemia, unspecified: Secondary | ICD-10-CM

## 2019-05-08 DIAGNOSIS — M5136 Other intervertebral disc degeneration, lumbar region: Secondary | ICD-10-CM | POA: Diagnosis not present

## 2019-05-08 DIAGNOSIS — Z87891 Personal history of nicotine dependence: Secondary | ICD-10-CM | POA: Insufficient documentation

## 2019-05-08 DIAGNOSIS — M48061 Spinal stenosis, lumbar region without neurogenic claudication: Secondary | ICD-10-CM | POA: Insufficient documentation

## 2019-05-08 DIAGNOSIS — K219 Gastro-esophageal reflux disease without esophagitis: Secondary | ICD-10-CM | POA: Diagnosis not present

## 2019-05-08 DIAGNOSIS — I10 Essential (primary) hypertension: Secondary | ICD-10-CM | POA: Diagnosis not present

## 2019-05-08 DIAGNOSIS — Z79899 Other long term (current) drug therapy: Secondary | ICD-10-CM | POA: Diagnosis not present

## 2019-05-08 DIAGNOSIS — M1990 Unspecified osteoarthritis, unspecified site: Secondary | ICD-10-CM | POA: Insufficient documentation

## 2019-05-08 DIAGNOSIS — G8929 Other chronic pain: Secondary | ICD-10-CM | POA: Diagnosis not present

## 2019-05-08 DIAGNOSIS — E876 Hypokalemia: Secondary | ICD-10-CM | POA: Insufficient documentation

## 2019-05-08 DIAGNOSIS — D72829 Elevated white blood cell count, unspecified: Secondary | ICD-10-CM | POA: Diagnosis not present

## 2019-05-08 DIAGNOSIS — Z7689 Persons encountering health services in other specified circumstances: Secondary | ICD-10-CM | POA: Diagnosis not present

## 2019-05-08 DIAGNOSIS — F329 Major depressive disorder, single episode, unspecified: Secondary | ICD-10-CM | POA: Insufficient documentation

## 2019-05-08 DIAGNOSIS — Z95828 Presence of other vascular implants and grafts: Secondary | ICD-10-CM

## 2019-05-08 DIAGNOSIS — J45909 Unspecified asthma, uncomplicated: Secondary | ICD-10-CM | POA: Diagnosis not present

## 2019-05-08 DIAGNOSIS — Z5111 Encounter for antineoplastic chemotherapy: Secondary | ICD-10-CM | POA: Diagnosis not present

## 2019-05-08 DIAGNOSIS — Z9221 Personal history of antineoplastic chemotherapy: Secondary | ICD-10-CM | POA: Insufficient documentation

## 2019-05-08 DIAGNOSIS — C19 Malignant neoplasm of rectosigmoid junction: Secondary | ICD-10-CM | POA: Diagnosis not present

## 2019-05-08 DIAGNOSIS — F419 Anxiety disorder, unspecified: Secondary | ICD-10-CM | POA: Diagnosis not present

## 2019-05-08 DIAGNOSIS — M549 Dorsalgia, unspecified: Secondary | ICD-10-CM | POA: Diagnosis not present

## 2019-05-08 DIAGNOSIS — T451X5S Adverse effect of antineoplastic and immunosuppressive drugs, sequela: Secondary | ICD-10-CM | POA: Insufficient documentation

## 2019-05-08 LAB — IRON AND TIBC
Iron: 59 ug/dL (ref 28–170)
Saturation Ratios: 21 % (ref 10.4–31.8)
TIBC: 286 ug/dL (ref 250–450)
UIBC: 227 ug/dL

## 2019-05-08 LAB — CBC WITH DIFFERENTIAL/PLATELET
Abs Immature Granulocytes: 3.53 10*3/uL — ABNORMAL HIGH (ref 0.00–0.07)
Basophils Absolute: 0.2 10*3/uL — ABNORMAL HIGH (ref 0.0–0.1)
Basophils Relative: 1 %
Eosinophils Absolute: 0 10*3/uL (ref 0.0–0.5)
Eosinophils Relative: 0 %
HCT: 23.9 % — ABNORMAL LOW (ref 36.0–46.0)
Hemoglobin: 7.9 g/dL — ABNORMAL LOW (ref 12.0–15.0)
Immature Granulocytes: 18 %
Lymphocytes Relative: 6 %
Lymphs Abs: 1.2 10*3/uL (ref 0.7–4.0)
MCH: 30 pg (ref 26.0–34.0)
MCHC: 33.1 g/dL (ref 30.0–36.0)
MCV: 90.9 fL (ref 80.0–100.0)
Monocytes Absolute: 1.9 10*3/uL — ABNORMAL HIGH (ref 0.1–1.0)
Monocytes Relative: 10 %
Neutro Abs: 12.8 10*3/uL — ABNORMAL HIGH (ref 1.7–7.7)
Neutrophils Relative %: 65 %
Platelets: 155 10*3/uL (ref 150–400)
RBC: 2.63 MIL/uL — ABNORMAL LOW (ref 3.87–5.11)
RDW: 17.9 % — ABNORMAL HIGH (ref 11.5–15.5)
Smear Review: ADEQUATE
WBC: 19.6 10*3/uL — ABNORMAL HIGH (ref 4.0–10.5)
nRBC: 0.9 % — ABNORMAL HIGH (ref 0.0–0.2)

## 2019-05-08 LAB — SAMPLE TO BLOOD BANK

## 2019-05-08 LAB — VITAMIN B12: Vitamin B-12: 651 pg/mL (ref 180–914)

## 2019-05-08 LAB — FOLATE: Folate: 32 ng/mL (ref >5.9)

## 2019-05-08 MED ORDER — ONDANSETRON HCL 8 MG PO TABS: 8.0000 mg | ORAL_TABLET | Freq: Two times a day (BID) | ORAL | 0 refills | Status: DC | PRN

## 2019-05-08 MED ORDER — SODIUM CHLORIDE 0.9 % IV SOLN
Freq: Once | INTRAVENOUS | Status: AC
  Administered 2019-05-08: 10:00:00 via INTRAVENOUS
  Filled 2019-05-08: qty 250

## 2019-05-08 MED ORDER — PROCHLORPERAZINE MALEATE 10 MG PO TABS: 10.0000 mg | ORAL_TABLET | Freq: Four times a day (QID) | ORAL | 1 refills | Status: DC | PRN

## 2019-05-08 MED ORDER — SODIUM CHLORIDE 0.9% FLUSH
10.0000 mL | Freq: Once | INTRAVENOUS | Status: AC
  Administered 2019-05-08: 09:00:00 10 mL via INTRAVENOUS
  Filled 2019-05-08: qty 10

## 2019-05-08 MED ORDER — HEPARIN SOD (PORK) LOCK FLUSH 100 UNIT/ML IV SOLN
500.0000 [IU] | Freq: Once | INTRAVENOUS | Status: AC
  Administered 2019-05-08: 11:00:00 500 [IU] via INTRAVENOUS

## 2019-05-08 NOTE — Telephone Encounter (Signed)
VO called to Triage nurse [place]

## 2019-05-08 NOTE — Telephone Encounter (Signed)
Referral from [facility] care was received for Palliative care services. They are asking if Dr Janese Banks is in agreement and will sign orders for this. Please advise

## 2019-05-08 NOTE — Progress Notes (Signed)
Pt is in pain today her back and it runs up her neck and down to her legs. She has ran out of muscle relaxers, and 1 b/p med. She needs refill for nausea med but not nauseated right now. Feels weak and tired. No energy to do things. Eating and drinking ok

## 2019-05-08 NOTE — Telephone Encounter (Signed)
yes

## 2019-05-11 ENCOUNTER — Encounter: Payer: Self-pay | Admitting: Oncology

## 2019-05-11 DIAGNOSIS — G8929 Other chronic pain: Secondary | ICD-10-CM | POA: Diagnosis not present

## 2019-05-11 DIAGNOSIS — M19011 Primary osteoarthritis, right shoulder: Secondary | ICD-10-CM | POA: Diagnosis not present

## 2019-05-11 DIAGNOSIS — K219 Gastro-esophageal reflux disease without esophagitis: Secondary | ICD-10-CM | POA: Diagnosis not present

## 2019-05-11 DIAGNOSIS — Z85038 Personal history of other malignant neoplasm of large intestine: Secondary | ICD-10-CM | POA: Diagnosis not present

## 2019-05-11 DIAGNOSIS — Z433 Encounter for attention to colostomy: Secondary | ICD-10-CM | POA: Diagnosis not present

## 2019-05-11 DIAGNOSIS — M4316 Spondylolisthesis, lumbar region: Secondary | ICD-10-CM | POA: Diagnosis not present

## 2019-05-11 DIAGNOSIS — M4802 Spinal stenosis, cervical region: Secondary | ICD-10-CM | POA: Diagnosis not present

## 2019-05-11 DIAGNOSIS — M533 Sacrococcygeal disorders, not elsewhere classified: Secondary | ICD-10-CM | POA: Diagnosis not present

## 2019-05-11 DIAGNOSIS — M5030 Other cervical disc degeneration, unspecified cervical region: Secondary | ICD-10-CM | POA: Diagnosis not present

## 2019-05-11 DIAGNOSIS — M5136 Other intervertebral disc degeneration, lumbar region: Secondary | ICD-10-CM | POA: Diagnosis not present

## 2019-05-11 DIAGNOSIS — M4810 Ankylosing hyperostosis [Forestier], site unspecified: Secondary | ICD-10-CM | POA: Diagnosis not present

## 2019-05-11 DIAGNOSIS — J45909 Unspecified asthma, uncomplicated: Secondary | ICD-10-CM | POA: Diagnosis not present

## 2019-05-11 DIAGNOSIS — I10 Essential (primary) hypertension: Secondary | ICD-10-CM | POA: Diagnosis not present

## 2019-05-11 DIAGNOSIS — C3400 Malignant neoplasm of unspecified main bronchus: Secondary | ICD-10-CM | POA: Diagnosis not present

## 2019-05-11 DIAGNOSIS — Z87440 Personal history of urinary (tract) infections: Secondary | ICD-10-CM | POA: Diagnosis not present

## 2019-05-11 NOTE — Progress Notes (Signed)
Hematology/Oncology Consult note [facility]  [phone] [phone]  Patient Care Team: Inc, [facility] as PCP - General Telford Nab, [place] as Registered Nurse   Name of the patient: Gloria Allen  [id number]  1946/04/26   Date of visit: 05/11/19  Diagnosis- 1.Stage III colon cancer status post surgery 2.New diagnosis of limited stage small cell lung cancer  Chief complaint/ Reason for visit-assessment after 3 cycles of chemotherapy for possible IV fluids  Heme/Onc history: patient is a 73 year old African-American female who recently underwent screening colonoscopy by Dr. Bonna Gains.Colonoscopy showed villous nonobstructing large mass in the sigmoid colon 12 cm proximal to the anus. Mass was partially circumferential. Biopsy showed invasive adenocarcinoma. Patient was seen by Dr. Gretchen Short underwent hemicolectomy with lymph node sampling on 11/27/2018. Final pathology showed invasive colorectal carcinoma 3.5 cm, grade 2. Tumor invades through muscularis propria into the peri-colorectal tissue. Margins negative. Tumor deposits present, 2. 1 out of 9 lymph nodes examined was positive for malignancy. [id number].Patient was feeling poorly after surgery and she had no social support and decided against adjuvant chemotherapy.  Patient underwent CT chest to complete her staging work-up which showed 2 lung nodules one in the right upper lobe and one in the left left upper lobe.This was followed by a PET CT scan which showed with the left upper lobe nodule was not hypermetabolic.The right upper lobe lung nodule measure 1.6 cm with an SUV of 6.8. No hypermetabolic axillary mediastinal or hilar lymphadenopathy or evidence of metastatic disease elsewhere. Patient underwent CT-guided biopsy of the right upper lobe lung nodule which was consistent with small cell lung cancerpositive for CD56 and TTF-1.Patient started concurrent  chemoradiation in June 2020. Carboplatin chosen instead of cisplatin with etoposide given her age, poor social support and concern for renal dysfunction.   Interval history-patient feels fatigued.  Appetite and weight have remained stable.  She complains of diffuse pain all over her body mainly in her back which she says runs down to her legs.  ECOG PS- 1-2 Pain scale- 4 Opioid associated constipation- no  Review of systems- Review of Systems  Constitutional: Positive for malaise/fatigue. Negative for chills, fever and weight loss.  HENT: Negative for congestion, ear discharge and nosebleeds.   Eyes: Negative for blurred vision.  Respiratory: Negative for cough, hemoptysis, sputum production, shortness of breath and wheezing.   Cardiovascular: Negative for chest pain, palpitations, orthopnea and claudication.  Gastrointestinal: Negative for abdominal pain, blood in stool, constipation, diarrhea, heartburn, melena, nausea and vomiting.  Genitourinary: Negative for dysuria, flank pain, frequency, hematuria and urgency.  Musculoskeletal: Positive for back pain, joint pain and neck pain. Negative for myalgias.  Skin: Negative for rash.  Neurological: Negative for dizziness, tingling, focal weakness, seizures, weakness and headaches.  Endo/Heme/Allergies: Does not bruise/bleed easily.  Psychiatric/Behavioral: Negative for depression and suicidal ideas. The patient does not have insomnia.        Allergies  Allergen Reactions  . Ace Inhibitors Swelling  . Angiotensin Receptor Blockers Swelling  . Sulfa Antibiotics Itching  . Chlorthalidone Rash  . Flexeril [Cyclobenzaprine] Rash     Past Medical History:  Diagnosis Date  . Anxiety   . Arthritis   . Asthma   . Cancer ([place])   . Cervical central spinal stenosis (C3-C7) (worse at C4-5) 07/30/2017  . Cervical foraminal stenosis (C4-5 and C5-6) (Bilateral) (L>R) 07/30/2017  . Chronic lower extremity pain (Fourth Area of Pain)  (Bilateral) (R>L) 06/11/2017  . Chronic neck pain (Primary  Area of Pain) (Bilateral) (R>L) 06/11/2017  . Chronic sacroiliac joint pain (Bilateral) (R>L) 06/11/2017  . Chronic shoulder pain [facility] Area of Pain) (Right) 06/11/2017  . Chronic upper extremity pain (Fifth Area of Pain) (Bilateral) (R>L) 06/11/2017  . Colon cancer ([place])   . DDD (degenerative disc disease), cervical 07/30/2017  . DDD (degenerative disc disease), lumbar 07/30/2017  . Depression   . DISH (diffuse idiopathic skeletal hyperostosis) 07/30/2017  . Dyspnea    with exertion  . Entrapment syndrome 06/20/2017   2002 on R and 2010 on L  . Full thickness rotator cuff tear 06/12/2017  . GERD (gastroesophageal reflux disease)   . Grade 1 Anterolisthesis of L4 over L5 07/30/2017  . Headache   . Hx: UTI (urinary tract infection)   . Hypertension   . Inflammation of joint of shoulder region 01/15/2017  . Lumbar central spinal stenosis (L4-5) 07/30/2017  . Lumbar disc protrusion (Left: L5-S1) (Right: L1-2) 07/30/2017   L5-S1 left foraminal protrusion with L5 impingement. L1-2 right paracentral protrusion without impingement.  . Lumbar facet arthropathy (Bilateral) 07/30/2017  . Lumbar facet syndrome (Bilateral) (R>L) 07/30/2017  . Lung cancer ([place])   . Osteoarthritis of shoulder (Right) 01/15/2017     Past Surgical History:  Procedure Laterality Date  . ABDOMINAL HYSTERECTOMY    . APPENDECTOMY    . BREAST REDUCTION SURGERY Bilateral   . COLONOSCOPY WITH PROPOFOL N/A 11/05/2018   Procedure: COLONOSCOPY WITH PROPOFOL;  Surgeon: Virgel Manifold, MD;  Location: [facility] ENDOSCOPY;  Service: Endoscopy;  Laterality: N/A;  . COLONOSCOPY WITH PROPOFOL N/A 11/06/2018   Procedure: COLONOSCOPY WITH PROPOFOL;  Surgeon: Virgel Manifold, MD;  Location: [facility] ENDOSCOPY;  Service: Endoscopy;  Laterality: N/A;  . COLOSTOMY    . DILATION AND CURETTAGE OF UTERUS    . ESOPHAGOGASTRODUODENOSCOPY (EGD) WITH PROPOFOL N/A 11/05/2018   Procedure:  ESOPHAGOGASTRODUODENOSCOPY (EGD) WITH PROPOFOL;  Surgeon: Virgel Manifold, MD;  Location: [facility] ENDOSCOPY;  Service: Endoscopy;  Laterality: N/A;  . ESOPHAGOGASTRODUODENOSCOPY (EGD) WITH PROPOFOL N/A 03/27/2019   Procedure: ESOPHAGOGASTRODUODENOSCOPY (EGD) WITH PROPOFOL;  Surgeon: Lucilla Lame, MD;  Location: [facility] ENDOSCOPY;  Service: Endoscopy;  Laterality: N/A;  . ILEOSTOMY Right 11/27/2018   Procedure: ILEOSTOMY;  Surgeon: Jules Husbands, MD;  Location: [facility] ORS;  Service: General;  Laterality: Right;  . LAPAROSCOPIC SIGMOID COLECTOMY N/A 11/27/2018   Procedure: LAPAROSCOPIC SIGMOID COLECTOMY;  Surgeon: Jules Husbands, MD;  Location: [facility] ORS;  Service: General;  Laterality: N/A;  . ORIF ANKLE FRACTURE Right 11/21/2018   Procedure: OPEN REDUCTION INTERNAL FIXATION (ORIF) ANKLE FRACTURE;  Surgeon: Corky Mull, MD;  Location: [facility] ORS;  Service: Orthopedics;  Laterality: Right;  . PORTACATH PLACEMENT N/A 03/12/2019   Procedure: INSERTION PORT-A-CATH;  Surgeon: Jules Husbands, MD;  Location: [facility] ORS;  Service: General;  Laterality: N/A;  . REDUCTION MAMMAPLASTY    . SHOULDER ARTHROSCOPY WITH OPEN ROTATOR CUFF REPAIR AND DISTAL CLAVICLE ACROMINECTOMY Right 03/26/2017   Procedure: right shoulder arthroscopy, arthroscopic subacromial decompression, distal clavicle excision, mini open rotator cuff repair;  Surgeon: Thornton Park, MD;  Location: [facility] ORS;  Service: Orthopedics;  Laterality: Right;    Social History   Socioeconomic History  . Marital status: Divorced    Spouse name: Not on file  . Number of children: 3  . Years of education: Not on file  . Highest education level: 11th grade  Occupational History  . Not on file  Social Needs  . Financial resource strain: Somewhat hard  . Food  insecurity    Worry: Sometimes true    Inability: Never true  . Transportation needs    Medical: Yes    Non-medical: No  Tobacco Use  . Smoking status: Former Smoker    Packs/day: 1.00     Types: Cigarettes    Quit date: 2010    Years since quitting: 10.6  . Smokeless tobacco: Never Used  . Tobacco comment: quit over 30 years ago   Substance and Sexual Activity  . Alcohol use: No  . Drug use: Yes    Types: Marijuana  . Sexual activity: Never  Lifestyle  . Physical activity    Days per week: 0 days    Minutes per session: 0 min  . Stress: Not at all  Relationships  . Social Herbalist on phone: Never    Gets together: Never    Attends religious service: More than 4 times per year    Active member of club or organization: No    Attends meetings of clubs or organizations: Never    Relationship status: Divorced  . Intimate partner violence    Fear of current or ex partner: No    Emotionally abused: No    Physically abused: No    Forced sexual activity: No  Other Topics Concern  . 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 She receives disability. She has 3 children and several grandchildren. She lives alone. She drives. Uses a walker and shower chair at baseline.     Family History  Problem Relation Age of Onset  . Hypertension Mother   . Breast cancer Mother 53  . Breast cancer Sister 25     Current Outpatient Medications:  .  albuterol (VENTOLIN HFA) 108 (90 Base) MCG/ACT inhaler, Inhale 2 puffs into the lungs every 6 (six) hours as needed for wheezing or shortness of breath., Disp: 18 g, Rfl: 0 .  amLODipine (NORVASC) 10 MG tablet, Take 1 tablet (10 mg total) by mouth daily., Disp: 30 tablet, Rfl: 0 .  benzonatate (TESSALON) 200 MG capsule, Take 1 capsule (200 mg total) by mouth 3 (three) times daily as needed for cough., Disp: 60 capsule, Rfl: 0 .  BREO ELLIPTA 200-25 MCG/INH AEPB, Inhale 1 puff into the lungs daily., Disp: 60 each, Rfl: 0 .  hydrALAZINE (APRESOLINE) 100 MG tablet, Take 1 tablet (100 mg total) by mouth 3 (three) times daily., Disp: 90 tablet, Rfl: 0 .  hydrOXYzine (ATARAX/VISTARIL) 10 MG  tablet, Take 1 tablet (10 mg total) by mouth 3 (three) times daily as needed for itching., Disp: 30 tablet, Rfl: 0 .  lovastatin (MEVACOR) 40 MG tablet, Take 1 tablet (40 mg total) by mouth at bedtime., Disp: 30 tablet, Rfl: 0 .  meclizine (ANTIVERT) 12.5 MG tablet, Take 1 tablet (12.5 mg total) by mouth 3 (three) times daily as needed for dizziness., Disp: 30 tablet, Rfl: 0 .  methocarbamol (ROBAXIN) 500 MG tablet, Take 1 tablet (500 mg total) by mouth at bedtime as needed for muscle spasms., Disp: 30 tablet, Rfl: 0 .  metoprolol succinate (TOPROL-XL) 100 MG 24 hr tablet, Take 1 tablet (100 mg total) by mouth daily. Take with or immediately following a meal., Disp: 30 tablet, Rfl: 0 .  mirtazapine (REMERON) 15 MG tablet, take 1 tablet by mouth at bedtime for APPETITIE, Disp: 30 tablet, Rfl: 0 .  montelukast (SINGULAIR) 10 MG tablet, Take 1 tablet (10 mg total)  by mouth at bedtime as needed., Disp: 30 tablet, Rfl: 0 .  OLANZapine (ZYPREXA) 10 MG tablet, Take 1 tablet (10 mg total) by mouth at bedtime., Disp: 30 tablet, Rfl: 0 .  omeprazole (PRILOSEC) 40 MG capsule, Take 1 capsule (40 mg total) by mouth 2 (two) times daily., Disp: 60 capsule, Rfl: 0 .  oxybutynin (DITROPAN) 5 MG tablet, Take 1 tablet (5 mg total) by mouth 2 (two) times daily., Disp: 60 tablet, Rfl: 0 .  sertraline (ZOLOFT) 100 MG tablet, Take 2 tablets (200 mg total) by mouth at bedtime., Disp: 60 tablet, Rfl: 0 .  terazosin (HYTRIN) 1 MG capsule, Take 1 capsule (1 mg total) by mouth at bedtime., Disp: 30 capsule, Rfl: 0 .  traZODone (DESYREL) 50 MG tablet, Take 0.5-1 tablets (25-50 mg total) by mouth at bedtime as needed for sleep., Disp: 30 tablet, Rfl: 0 .  triamcinolone cream (KENALOG) 0.1 %, Apply 1 application topically 2 (two) times daily as needed (itching)., Disp: 15 g, Rfl: 0 .  clonazePAM (KLONOPIN) 0.5 MG tablet, Take 1 tablet (0.5 mg total) by mouth 3 (three) times daily. (Patient not taking: Reported on 05/08/2019), Disp: 30  tablet, Rfl: 0 .  cloNIDine (CATAPRES) 0.2 MG tablet, Take 1 tablet (0.2 mg total) by mouth 2 (two) times daily., Disp: 60 tablet, Rfl: 0 .  HYDROcodone-homatropine (HYCODAN) 5-1.5 MG/5ML syrup, Take 5 mLs by mouth every 6 (six) hours as needed for cough. (Patient not taking: Reported on 05/08/2019), Disp: 120 mL, Rfl: 0 .  loperamide (IMODIUM) 2 MG capsule, Take 2 capsules (4 mg total) by mouth 3 (three) times daily. (Patient not taking: Reported on 04/28/2019), Disp: 180 capsule, Rfl: 0 .  ondansetron (ZOFRAN) 8 MG tablet, Take 1 tablet (8 mg total) by mouth 2 (two) times daily as needed for refractory nausea / vomiting. Start on day 3 after carboplatin chemo., Disp: 30 tablet, Rfl: 0 .  potassium chloride SA (K-DUR) 20 MEQ tablet, Take 1 tablet (20 mEq total) by mouth daily. (Patient not taking: Reported on 05/08/2019), Disp: 20 tablet, Rfl: 0 .  prochlorperazine (COMPAZINE) 10 MG tablet, Take 1 tablet (10 mg total) by mouth every 6 (six) hours as needed (Nausea or vomiting)., Disp: 30 tablet, Rfl: 1 .  tiZANidine (ZANAFLEX) 2 MG tablet, Take 1 tablet (2 mg total) by mouth every 6 (six) hours as needed for muscle spasms. (Patient not taking: Reported on 05/08/2019), Disp: 30 tablet, Rfl: 0 No current facility-administered medications for this visit.   Facility-Administered Medications Ordered in Other Visits:  .  sodium chloride flush (NS) 0.9 % injection 10 mL, 10 mL, Intravenous, PRN, Verlon Au, NP, 10 mL at 04/13/19 1430  Physical exam:  Vitals:   05/08/19 0902  BP: 117/75  Pulse: 88  Resp: 18  Temp: (!) 96.3 F (35.7 C)  TempSrc: Tympanic  Weight: 153 lb 14.4 oz (69.8 kg)  Height: 5\' 5"  (1.651 m)   Physical Exam Constitutional:      Comments: She is sitting in a wheelchair.  Appears in no acute distress  HENT:     Head: Normocephalic and atraumatic.  Eyes:     Pupils: Pupils are equal, round, and reactive to light.  Neck:     Musculoskeletal: Normal range of motion.   Cardiovascular:     Rate and Rhythm: Normal rate and regular rhythm.     Heart sounds: Normal heart sounds.  Pulmonary:     Effort: Pulmonary effort is normal.  Breath sounds: Normal breath sounds.  Abdominal:     General: Bowel sounds are normal.     Palpations: Abdomen is soft.  Skin:    General: Skin is warm and dry.  Neurological:     Mental Status: She is alert and oriented to person, place, and time.      CMP Latest Ref Rng & Units 04/28/2019  Glucose 70 - 99 mg/dL 124(H)  BUN 8 - 23 mg/dL 12  Creatinine 0.44 - 1.00 mg/dL 0.75  Sodium 135 - 145 mmol/L 139  Potassium 3.5 - 5.1 mmol/L 3.8  Chloride 98 - 111 mmol/L 108  CO2 22 - 32 mmol/L 20(L)  Calcium 8.9 - 10.3 mg/dL 9.5  Total Protein 6.5 - 8.1 g/dL 7.4  Total Bilirubin 0.3 - 1.2 mg/dL 0.4  Alkaline Phos 38 - 126 U/L 78  AST 15 - 41 U/L 18  ALT 0 - 44 U/L 18   CBC Latest Ref Rng & Units 05/08/2019  WBC 4.0 - 10.5 K/uL 19.6(H)  Hemoglobin 12.0 - 15.0 g/dL 7.9(L)  Hematocrit 36.0 - 46.0 % 23.9(L)  Platelets 150 - 400 K/uL 155    No images are attached to the encounter.  Dg Chest 2 View  Result Date: 04/29/2019 CLINICAL DATA:  Chest pain and cough.  History of colon carcinoma EXAM: CHEST - 2 VIEW COMPARISON:  March 12, 2019 FINDINGS: Port-A-Cath tip is in the superior vena cava. No pneumothorax. Lungs are clear. The heart size and pulmonary vascularity are normal. No evident adenopathy. There are foci of degenerative change in the thoracic spine. No blastic or lytic bone lesions. Postoperative change noted in the right humeral head region. IMPRESSION: Port-A-Cath tip in superior vena cava. No pneumothorax. No edema or consolidation. No neoplastic focus demonstrable. No adenopathy. Electronically Signed   By: Lowella Grip III M.D.   On: 04/29/2019 09:52     Assessment and plan- Patient is a 73 y.o. female with history of stage III colon cancer status post resection and did not receive adjuvant chemotherapy.  She  now has limited stage small cell lung cancer and is status post 3 cycles of carboplatin and etoposide.  This is an acute visit to assess for possible IV fluids  We will proceed with 1 L of IV fluids today.  She is roughly 10 days out from her chemotherapy.  I will see her back on 05/19/2019 for cycle 4-day 1 of carboplatin and etoposide.  We will also hold tube for possible blood transfusion on that day  Leukocytosis: Secondary to 1 for Neulasta.  Continue to monitor  Patient is significant anemia secondary to chemotherapy.  Iron studies B12 and folate are normal.  She is due for one last cycle of chemotherapy and hopefully her counts will start improving after that.  I will renew her nausea medications today.  I have never prescribed her muscle relaxers or clonazepam in the past   Visit Diagnosis 1. Antineoplastic chemotherapy induced anemia   2. Small cell lung cancer (HCC)   3. Dehydration      Dr. Randa Evens, MD, MPH [facility] at [facility] [phone] 05/11/2019 8:37 AM

## 2019-05-12 ENCOUNTER — Telehealth: Payer: Self-pay | Admitting: Nurse Practitioner

## 2019-05-12 NOTE — Telephone Encounter (Signed)
Spoke with patient about Palliative services and she was in agreement with this.  I have scheduled a Telephone Palliative Consult for 06/01/19 @ 12:30 PM

## 2019-05-14 ENCOUNTER — Other Ambulatory Visit: Payer: Self-pay

## 2019-05-15 ENCOUNTER — Other Ambulatory Visit: Payer: Self-pay

## 2019-05-15 ENCOUNTER — Inpatient Hospital Stay: Payer: Medicare Other

## 2019-05-15 ENCOUNTER — Inpatient Hospital Stay: Payer: Medicare Other | Admitting: *Deleted

## 2019-05-15 DIAGNOSIS — Z9221 Personal history of antineoplastic chemotherapy: Secondary | ICD-10-CM | POA: Diagnosis not present

## 2019-05-15 DIAGNOSIS — M1990 Unspecified osteoarthritis, unspecified site: Secondary | ICD-10-CM | POA: Diagnosis not present

## 2019-05-15 DIAGNOSIS — I10 Essential (primary) hypertension: Secondary | ICD-10-CM | POA: Diagnosis not present

## 2019-05-15 DIAGNOSIS — Z87891 Personal history of nicotine dependence: Secondary | ICD-10-CM | POA: Diagnosis not present

## 2019-05-15 DIAGNOSIS — Z95828 Presence of other vascular implants and grafts: Secondary | ICD-10-CM

## 2019-05-15 DIAGNOSIS — Z5111 Encounter for antineoplastic chemotherapy: Secondary | ICD-10-CM | POA: Diagnosis not present

## 2019-05-15 DIAGNOSIS — C3490 Malignant neoplasm of unspecified part of unspecified bronchus or lung: Secondary | ICD-10-CM

## 2019-05-15 DIAGNOSIS — M549 Dorsalgia, unspecified: Secondary | ICD-10-CM | POA: Diagnosis not present

## 2019-05-15 DIAGNOSIS — Z79899 Other long term (current) drug therapy: Secondary | ICD-10-CM | POA: Diagnosis not present

## 2019-05-15 DIAGNOSIS — K219 Gastro-esophageal reflux disease without esophagitis: Secondary | ICD-10-CM | POA: Diagnosis not present

## 2019-05-15 DIAGNOSIS — T451X5A Adverse effect of antineoplastic and immunosuppressive drugs, initial encounter: Secondary | ICD-10-CM

## 2019-05-15 DIAGNOSIS — M79606 Pain in leg, unspecified: Secondary | ICD-10-CM | POA: Diagnosis not present

## 2019-05-15 DIAGNOSIS — D72829 Elevated white blood cell count, unspecified: Secondary | ICD-10-CM | POA: Diagnosis not present

## 2019-05-15 DIAGNOSIS — Z7689 Persons encountering health services in other specified circumstances: Secondary | ICD-10-CM | POA: Diagnosis not present

## 2019-05-15 DIAGNOSIS — C19 Malignant neoplasm of rectosigmoid junction: Secondary | ICD-10-CM | POA: Diagnosis not present

## 2019-05-15 DIAGNOSIS — G8929 Other chronic pain: Secondary | ICD-10-CM | POA: Diagnosis not present

## 2019-05-15 DIAGNOSIS — M5136 Other intervertebral disc degeneration, lumbar region: Secondary | ICD-10-CM | POA: Diagnosis not present

## 2019-05-15 DIAGNOSIS — D6481 Anemia due to antineoplastic chemotherapy: Secondary | ICD-10-CM | POA: Diagnosis not present

## 2019-05-15 DIAGNOSIS — E860 Dehydration: Secondary | ICD-10-CM | POA: Diagnosis not present

## 2019-05-15 DIAGNOSIS — J45909 Unspecified asthma, uncomplicated: Secondary | ICD-10-CM | POA: Diagnosis not present

## 2019-05-15 DIAGNOSIS — T451X5S Adverse effect of antineoplastic and immunosuppressive drugs, sequela: Secondary | ICD-10-CM | POA: Diagnosis not present

## 2019-05-15 DIAGNOSIS — E876 Hypokalemia: Secondary | ICD-10-CM | POA: Diagnosis not present

## 2019-05-15 DIAGNOSIS — C801 Malignant (primary) neoplasm, unspecified: Secondary | ICD-10-CM

## 2019-05-15 DIAGNOSIS — M48061 Spinal stenosis, lumbar region without neurogenic claudication: Secondary | ICD-10-CM | POA: Diagnosis not present

## 2019-05-15 LAB — CBC WITH DIFFERENTIAL/PLATELET
Abs Immature Granulocytes: 1.13 10*3/uL — ABNORMAL HIGH (ref 0.00–0.07)
Basophils Absolute: 0 10*3/uL (ref 0.0–0.1)
Basophils Relative: 0 %
Eosinophils Absolute: 0 10*3/uL (ref 0.0–0.5)
Eosinophils Relative: 0 %
HCT: 25.4 % — ABNORMAL LOW (ref 36.0–46.0)
Hemoglobin: 8.2 g/dL — ABNORMAL LOW (ref 12.0–15.0)
Immature Granulocytes: 7 %
Lymphocytes Relative: 7 %
Lymphs Abs: 1.2 10*3/uL (ref 0.7–4.0)
MCH: 29.8 pg (ref 26.0–34.0)
MCHC: 32.3 g/dL (ref 30.0–36.0)
MCV: 92.4 fL (ref 80.0–100.0)
Monocytes Absolute: 1 10*3/uL (ref 0.1–1.0)
Monocytes Relative: 6 %
Neutro Abs: 14 10*3/uL — ABNORMAL HIGH (ref 1.7–7.7)
Neutrophils Relative %: 80 %
Platelets: 205 10*3/uL (ref 150–400)
RBC: 2.75 MIL/uL — ABNORMAL LOW (ref 3.87–5.11)
RDW: 18.8 % — ABNORMAL HIGH (ref 11.5–15.5)
WBC: 17.3 10*3/uL — ABNORMAL HIGH (ref 4.0–10.5)
nRBC: 0.7 % — ABNORMAL HIGH (ref 0.0–0.2)

## 2019-05-15 LAB — SAMPLE TO BLOOD BANK

## 2019-05-15 MED ORDER — SODIUM CHLORIDE 0.9 % IV SOLN
INTRAVENOUS | Status: AC
  Administered 2019-05-15: 11:00:00 via INTRAVENOUS
  Filled 2019-05-15 (×2): qty 250

## 2019-05-15 MED ORDER — HEPARIN SOD (PORK) LOCK FLUSH 100 UNIT/ML IV SOLN
500.0000 [IU] | Freq: Once | INTRAVENOUS | Status: AC
  Administered 2019-05-15: 500 [IU] via INTRAVENOUS
  Filled 2019-05-15: qty 5

## 2019-05-15 MED ORDER — SODIUM CHLORIDE 0.9% FLUSH
10.0000 mL | Freq: Once | INTRAVENOUS | Status: AC
  Administered 2019-05-15: 10 mL via INTRAVENOUS
  Filled 2019-05-15: qty 10

## 2019-05-19 ENCOUNTER — Ambulatory Visit: Payer: Medicare Other | Admitting: Oncology

## 2019-05-19 ENCOUNTER — Inpatient Hospital Stay: Payer: Medicare Other

## 2019-05-19 ENCOUNTER — Encounter: Payer: Self-pay | Admitting: Oncology

## 2019-05-19 ENCOUNTER — Other Ambulatory Visit: Payer: Self-pay

## 2019-05-19 ENCOUNTER — Ambulatory Visit: Payer: Medicare Other

## 2019-05-19 ENCOUNTER — Inpatient Hospital Stay (HOSPITAL_BASED_OUTPATIENT_CLINIC_OR_DEPARTMENT_OTHER): Payer: Medicare Other | Admitting: Oncology

## 2019-05-19 ENCOUNTER — Other Ambulatory Visit: Payer: Medicare Other

## 2019-05-19 VITALS — BP 160/83 | HR 71 | Temp 97.4°F | Wt 150.0 lb

## 2019-05-19 DIAGNOSIS — E876 Hypokalemia: Secondary | ICD-10-CM

## 2019-05-19 DIAGNOSIS — C3490 Malignant neoplasm of unspecified part of unspecified bronchus or lung: Secondary | ICD-10-CM | POA: Diagnosis not present

## 2019-05-19 DIAGNOSIS — M545 Low back pain, unspecified: Secondary | ICD-10-CM

## 2019-05-19 DIAGNOSIS — Z5111 Encounter for antineoplastic chemotherapy: Secondary | ICD-10-CM | POA: Diagnosis not present

## 2019-05-19 DIAGNOSIS — G8929 Other chronic pain: Secondary | ICD-10-CM | POA: Diagnosis not present

## 2019-05-19 DIAGNOSIS — Z79899 Other long term (current) drug therapy: Secondary | ICD-10-CM | POA: Diagnosis not present

## 2019-05-19 DIAGNOSIS — K219 Gastro-esophageal reflux disease without esophagitis: Secondary | ICD-10-CM | POA: Diagnosis not present

## 2019-05-19 DIAGNOSIS — J45909 Unspecified asthma, uncomplicated: Secondary | ICD-10-CM | POA: Diagnosis not present

## 2019-05-19 DIAGNOSIS — M5136 Other intervertebral disc degeneration, lumbar region: Secondary | ICD-10-CM | POA: Diagnosis not present

## 2019-05-19 DIAGNOSIS — M1990 Unspecified osteoarthritis, unspecified site: Secondary | ICD-10-CM | POA: Diagnosis not present

## 2019-05-19 DIAGNOSIS — D72829 Elevated white blood cell count, unspecified: Secondary | ICD-10-CM | POA: Diagnosis not present

## 2019-05-19 DIAGNOSIS — M79606 Pain in leg, unspecified: Secondary | ICD-10-CM | POA: Diagnosis not present

## 2019-05-19 DIAGNOSIS — M48061 Spinal stenosis, lumbar region without neurogenic claudication: Secondary | ICD-10-CM | POA: Diagnosis not present

## 2019-05-19 DIAGNOSIS — Z7689 Persons encountering health services in other specified circumstances: Secondary | ICD-10-CM | POA: Diagnosis not present

## 2019-05-19 DIAGNOSIS — E860 Dehydration: Secondary | ICD-10-CM | POA: Diagnosis not present

## 2019-05-19 DIAGNOSIS — Z87891 Personal history of nicotine dependence: Secondary | ICD-10-CM | POA: Diagnosis not present

## 2019-05-19 DIAGNOSIS — I10 Essential (primary) hypertension: Secondary | ICD-10-CM | POA: Diagnosis not present

## 2019-05-19 DIAGNOSIS — Z9221 Personal history of antineoplastic chemotherapy: Secondary | ICD-10-CM | POA: Diagnosis not present

## 2019-05-19 DIAGNOSIS — D6481 Anemia due to antineoplastic chemotherapy: Secondary | ICD-10-CM | POA: Diagnosis not present

## 2019-05-19 DIAGNOSIS — T451X5S Adverse effect of antineoplastic and immunosuppressive drugs, sequela: Secondary | ICD-10-CM | POA: Diagnosis not present

## 2019-05-19 DIAGNOSIS — C19 Malignant neoplasm of rectosigmoid junction: Secondary | ICD-10-CM | POA: Diagnosis not present

## 2019-05-19 DIAGNOSIS — M549 Dorsalgia, unspecified: Secondary | ICD-10-CM | POA: Diagnosis not present

## 2019-05-19 LAB — COMPREHENSIVE METABOLIC PANEL
ALT: 22 U/L (ref 0–44)
AST: 28 U/L (ref 15–41)
Albumin: 3.8 g/dL (ref 3.5–5.0)
Alkaline Phosphatase: 83 U/L (ref 38–126)
Anion gap: 12 (ref 5–15)
BUN: 10 mg/dL (ref 8–23)
CO2: 24 mmol/L (ref 22–32)
Calcium: 9 mg/dL (ref 8.9–10.3)
Chloride: 104 mmol/L (ref 98–111)
Creatinine, Ser: 0.69 mg/dL (ref 0.44–1.00)
GFR calc Af Amer: >60 mL/min (ref >60)
GFR calc non Af Amer: >60 mL/min (ref >60)
Glucose, Bld: 124 mg/dL — ABNORMAL HIGH (ref 70–99)
Potassium: 2.9 mmol/L — ABNORMAL LOW (ref 3.5–5.1)
Sodium: 140 mmol/L (ref 135–145)
Total Bilirubin: 0.4 mg/dL (ref 0.3–1.2)
Total Protein: 7.1 g/dL (ref 6.5–8.1)

## 2019-05-19 LAB — CBC WITH DIFFERENTIAL/PLATELET
Abs Immature Granulocytes: 0.12 10*3/uL — ABNORMAL HIGH (ref 0.00–0.07)
Basophils Absolute: 0 10*3/uL (ref 0.0–0.1)
Basophils Relative: 0 %
Eosinophils Absolute: 0 10*3/uL (ref 0.0–0.5)
Eosinophils Relative: 0 %
HCT: 25.4 % — ABNORMAL LOW (ref 36.0–46.0)
Hemoglobin: 8.4 g/dL — ABNORMAL LOW (ref 12.0–15.0)
Immature Granulocytes: 1 %
Lymphocytes Relative: 9 %
Lymphs Abs: 0.8 10*3/uL (ref 0.7–4.0)
MCH: 30.7 pg (ref 26.0–34.0)
MCHC: 33.1 g/dL (ref 30.0–36.0)
MCV: 92.7 fL (ref 80.0–100.0)
Monocytes Absolute: 0.6 10*3/uL (ref 0.1–1.0)
Monocytes Relative: 7 %
Neutro Abs: 7.6 10*3/uL (ref 1.7–7.7)
Neutrophils Relative %: 83 %
Platelets: 324 10*3/uL (ref 150–400)
RBC: 2.74 MIL/uL — ABNORMAL LOW (ref 3.87–5.11)
RDW: 18.9 % — ABNORMAL HIGH (ref 11.5–15.5)
WBC: 9.2 10*3/uL (ref 4.0–10.5)
nRBC: 0.2 % (ref 0.0–0.2)

## 2019-05-19 MED ORDER — SODIUM CHLORIDE 0.9 % IV SOLN
320.0000 mg | Freq: Once | INTRAVENOUS | Status: AC
  Administered 2019-05-19: 320 mg via INTRAVENOUS
  Filled 2019-05-19: qty 32

## 2019-05-19 MED ORDER — PALONOSETRON HCL INJECTION 0.25 MG/5ML
0.2500 mg | Freq: Once | INTRAVENOUS | Status: AC
  Administered 2019-05-19: 0.25 mg via INTRAVENOUS
  Filled 2019-05-19: qty 5

## 2019-05-19 MED ORDER — SODIUM CHLORIDE 0.9 % IV SOLN
Freq: Once | INTRAVENOUS | Status: AC
  Administered 2019-05-19: 12:00:00 via INTRAVENOUS
  Filled 2019-05-19: qty 250

## 2019-05-19 MED ORDER — SODIUM CHLORIDE 0.9 % IV SOLN
100.0000 mg/m2 | Freq: Once | INTRAVENOUS | Status: AC
  Administered 2019-05-19: 180 mg via INTRAVENOUS
  Filled 2019-05-19: qty 9

## 2019-05-19 MED ORDER — SODIUM CHLORIDE 0.9% FLUSH
10.0000 mL | INTRAVENOUS | Status: DC | PRN
  Administered 2019-05-19: 10 mL via INTRAVENOUS
  Filled 2019-05-19: qty 10

## 2019-05-19 MED ORDER — HEPARIN SOD (PORK) LOCK FLUSH 100 UNIT/ML IV SOLN
500.0000 [IU] | Freq: Once | INTRAVENOUS | Status: AC
  Administered 2019-05-19: 500 [IU] via INTRAVENOUS
  Filled 2019-05-19: qty 5

## 2019-05-19 MED ORDER — DEXAMETHASONE SODIUM PHOSPHATE 10 MG/ML IJ SOLN
10.0000 mg | Freq: Once | INTRAMUSCULAR | Status: AC
  Administered 2019-05-19: 10 mg via INTRAVENOUS
  Filled 2019-05-19: qty 1

## 2019-05-19 MED ORDER — SODIUM CHLORIDE 0.9 % IV SOLN
Freq: Once | INTRAVENOUS | Status: AC
  Administered 2019-05-19: 12:00:00 via INTRAVENOUS
  Filled 2019-05-19: qty 1000

## 2019-05-19 NOTE — Progress Notes (Signed)
Pt reports needing some medications refilled. Pt also reports 8/10 back and leg pain today.

## 2019-05-20 ENCOUNTER — Other Ambulatory Visit: Payer: Self-pay | Admitting: Oncology

## 2019-05-20 ENCOUNTER — Inpatient Hospital Stay: Payer: Medicare Other

## 2019-05-20 ENCOUNTER — Other Ambulatory Visit: Payer: Self-pay

## 2019-05-20 VITALS — BP 179/78 | HR 85 | Temp 98.6°F

## 2019-05-20 DIAGNOSIS — M48061 Spinal stenosis, lumbar region without neurogenic claudication: Secondary | ICD-10-CM | POA: Diagnosis not present

## 2019-05-20 DIAGNOSIS — Z7689 Persons encountering health services in other specified circumstances: Secondary | ICD-10-CM | POA: Diagnosis not present

## 2019-05-20 DIAGNOSIS — J45909 Unspecified asthma, uncomplicated: Secondary | ICD-10-CM | POA: Diagnosis not present

## 2019-05-20 DIAGNOSIS — D72829 Elevated white blood cell count, unspecified: Secondary | ICD-10-CM | POA: Diagnosis not present

## 2019-05-20 DIAGNOSIS — M5136 Other intervertebral disc degeneration, lumbar region: Secondary | ICD-10-CM | POA: Diagnosis not present

## 2019-05-20 DIAGNOSIS — K219 Gastro-esophageal reflux disease without esophagitis: Secondary | ICD-10-CM | POA: Diagnosis not present

## 2019-05-20 DIAGNOSIS — M1990 Unspecified osteoarthritis, unspecified site: Secondary | ICD-10-CM | POA: Diagnosis not present

## 2019-05-20 DIAGNOSIS — E860 Dehydration: Secondary | ICD-10-CM | POA: Diagnosis not present

## 2019-05-20 DIAGNOSIS — Z5111 Encounter for antineoplastic chemotherapy: Secondary | ICD-10-CM | POA: Diagnosis not present

## 2019-05-20 DIAGNOSIS — T451X5S Adverse effect of antineoplastic and immunosuppressive drugs, sequela: Secondary | ICD-10-CM | POA: Diagnosis not present

## 2019-05-20 DIAGNOSIS — M79606 Pain in leg, unspecified: Secondary | ICD-10-CM | POA: Diagnosis not present

## 2019-05-20 DIAGNOSIS — G8929 Other chronic pain: Secondary | ICD-10-CM | POA: Diagnosis not present

## 2019-05-20 DIAGNOSIS — I10 Essential (primary) hypertension: Secondary | ICD-10-CM | POA: Diagnosis not present

## 2019-05-20 DIAGNOSIS — Z79899 Other long term (current) drug therapy: Secondary | ICD-10-CM | POA: Diagnosis not present

## 2019-05-20 DIAGNOSIS — C3490 Malignant neoplasm of unspecified part of unspecified bronchus or lung: Secondary | ICD-10-CM

## 2019-05-20 DIAGNOSIS — Z87891 Personal history of nicotine dependence: Secondary | ICD-10-CM | POA: Diagnosis not present

## 2019-05-20 DIAGNOSIS — D6481 Anemia due to antineoplastic chemotherapy: Secondary | ICD-10-CM | POA: Diagnosis not present

## 2019-05-20 DIAGNOSIS — M549 Dorsalgia, unspecified: Secondary | ICD-10-CM | POA: Diagnosis not present

## 2019-05-20 DIAGNOSIS — E876 Hypokalemia: Secondary | ICD-10-CM | POA: Diagnosis not present

## 2019-05-20 DIAGNOSIS — Z9221 Personal history of antineoplastic chemotherapy: Secondary | ICD-10-CM | POA: Diagnosis not present

## 2019-05-20 DIAGNOSIS — C19 Malignant neoplasm of rectosigmoid junction: Secondary | ICD-10-CM | POA: Diagnosis not present

## 2019-05-20 MED ORDER — HEPARIN SOD (PORK) LOCK FLUSH 100 UNIT/ML IV SOLN
500.0000 [IU] | Freq: Once | INTRAVENOUS | Status: AC
  Administered 2019-05-20: 500 [IU] via INTRAVENOUS

## 2019-05-20 MED ORDER — POTASSIUM CHLORIDE 20 MEQ/100ML IV SOLN
20.0000 meq | Freq: Once | INTRAVENOUS | Status: AC
  Administered 2019-05-20: 20 meq via INTRAVENOUS

## 2019-05-20 MED ORDER — SODIUM CHLORIDE 0.9% FLUSH
10.0000 mL | INTRAVENOUS | Status: DC | PRN
  Administered 2019-05-20: 10 mL via INTRAVENOUS
  Filled 2019-05-20: qty 10

## 2019-05-20 MED ORDER — DEXAMETHASONE SODIUM PHOSPHATE 10 MG/ML IJ SOLN
10.0000 mg | Freq: Once | INTRAMUSCULAR | Status: AC
  Administered 2019-05-20: 10 mg via INTRAVENOUS
  Filled 2019-05-20: qty 1

## 2019-05-20 MED ORDER — SODIUM CHLORIDE 0.9 % IV SOLN
100.0000 mg/m2 | Freq: Once | INTRAVENOUS | Status: AC
  Administered 2019-05-20: 180 mg via INTRAVENOUS
  Filled 2019-05-20: qty 9

## 2019-05-20 MED ORDER — POTASSIUM CHLORIDE CRYS ER 20 MEQ PO TBCR: 20.0000 meq | EXTENDED_RELEASE_TABLET | Freq: Every day | ORAL | 0 refills | Status: DC

## 2019-05-20 MED ORDER — SODIUM CHLORIDE 0.9 % IV SOLN
Freq: Once | INTRAVENOUS | Status: AC
  Administered 2019-05-20: 11:00:00 via INTRAVENOUS
  Filled 2019-05-20: qty 250

## 2019-05-20 NOTE — Progress Notes (Signed)
Hematology/Oncology Consult note [facility]  [phone] [phone]  Patient Care Team: Inc, [facility] as PCP - General Telford Nab, [place] as Registered Nurse   Name of the patient: Gloria Allen  [id number]  [birth year]   Date of visit: 05/20/19  Diagnosis- 1.Stage III colon cancer status post surgery 2.Limited stage small cell lung cancer   Chief complaint/ Reason for visit- on treatment assessment prior to cycle 4 day 1 of carboplatin and etoposide  Heme/Onc history: patient is a 73 year old African-American female who recently underwent screening colonoscopy by Dr. Bonna Gains.Colonoscopy showed villous nonobstructing large mass in the sigmoid colon 12 cm proximal to the anus. Mass was partially circumferential. Biopsy showed invasive adenocarcinoma. Patient was seen by Dr. Gretchen Short underwent hemicolectomy with lymph node sampling on 11/27/2018. Final pathology showed invasive colorectal carcinoma 3.5 cm, grade 2. Tumor invades through muscularis propria into the peri-colorectal tissue. Margins negative. Tumor deposits present, 2. 1 out of 9 lymph nodes examined was positive for malignancy. [id number].Patient was feeling poorly after surgery and she had no social support and decided against adjuvant chemotherapy.  Patient underwent CT chest to complete her staging work-up which showed 2 lung nodules one in the right upper lobe and one in the left left upper lobe.This was followed by a PET CT scan which showed with the left upper lobe nodule was not hypermetabolic.The right upper lobe lung nodule measure 1.6 cm with an SUV of 6.8. No hypermetabolic axillary mediastinal or hilar lymphadenopathy or evidence of metastatic disease elsewhere. Patient underwent CT-guided biopsy of the right upper lobe lung nodule which was consistent with small cell lung cancerpositive for CD56 and TTF-1.Patient started concurrent  chemoradiation in June 2020. Carboplatin chosen instead of cisplatin with etoposide given her age, poor social support and concern for renal dysfunction.  Interval history- patient reports ongoing fatigue. She has chronic low back pain which has been bothering her even before chemotherapy. She says it is hard for her to go around the house and do her chores because of her back pain  ECOG PS- 2 Pain scale- 6 Opioid associated constipation- no  Review of systems- Review of Systems  Constitutional: Positive for malaise/fatigue. Negative for chills, fever and weight loss.  HENT: Negative for congestion, ear discharge and nosebleeds.   Eyes: Negative for blurred vision.  Respiratory: Negative for cough, hemoptysis, sputum production, shortness of breath and wheezing.   Cardiovascular: Negative for chest pain, palpitations, orthopnea and claudication.  Gastrointestinal: Negative for abdominal pain, blood in stool, constipation, diarrhea, heartburn, melena, nausea and vomiting.  Genitourinary: Negative for dysuria, flank pain, frequency, hematuria and urgency.  Musculoskeletal: Positive for back pain. Negative for joint pain and myalgias.  Skin: Negative for rash.  Neurological: Negative for dizziness, tingling, focal weakness, seizures, weakness and headaches.  Endo/Heme/Allergies: Does not bruise/bleed easily.  Psychiatric/Behavioral: Negative for depression and suicidal ideas. The patient does not have insomnia.        Allergies  Allergen Reactions  . Ace Inhibitors Swelling  . Angiotensin Receptor Blockers Swelling  . Sulfa Antibiotics Itching  . Chlorthalidone Rash  . Flexeril [Cyclobenzaprine] Rash     Past Medical History:  Diagnosis Date  . Anxiety   . Arthritis   . Asthma   . Cancer ([place])   . Cervical central spinal stenosis (C3-C7) (worse at C4-5) 07/30/2017  . Cervical foraminal stenosis (C4-5 and C5-6) (Bilateral) (L>R) 07/30/2017  . Chronic lower extremity pain  (Fourth Area of Pain) (Bilateral) (R>L)  06/11/2017  . Chronic neck pain (Primary Area of Pain) (Bilateral) (R>L) 06/11/2017  . Chronic sacroiliac joint pain (Bilateral) (R>L) 06/11/2017  . Chronic shoulder pain [facility] Area of Pain) (Right) 06/11/2017  . Chronic upper extremity pain (Fifth Area of Pain) (Bilateral) (R>L) 06/11/2017  . Colon cancer ([place])   . DDD (degenerative disc disease), cervical 07/30/2017  . DDD (degenerative disc disease), lumbar 07/30/2017  . Depression   . DISH (diffuse idiopathic skeletal hyperostosis) 07/30/2017  . Dyspnea    with exertion  . Entrapment syndrome 06/20/2017   2002 on R and 2010 on L  . Full thickness rotator cuff tear 06/12/2017  . GERD (gastroesophageal reflux disease)   . Grade 1 Anterolisthesis of L4 over L5 07/30/2017  . Headache   . Hx: UTI (urinary tract infection)   . Hypertension   . Inflammation of joint of shoulder region 01/15/2017  . Lumbar central spinal stenosis (L4-5) 07/30/2017  . Lumbar disc protrusion (Left: L5-S1) (Right: L1-2) 07/30/2017   L5-S1 left foraminal protrusion with L5 impingement. L1-2 right paracentral protrusion without impingement.  . Lumbar facet arthropathy (Bilateral) 07/30/2017  . Lumbar facet syndrome (Bilateral) (R>L) 07/30/2017  . Lung cancer ([place])   . Osteoarthritis of shoulder (Right) 01/15/2017     Past Surgical History:  Procedure Laterality Date  . ABDOMINAL HYSTERECTOMY    . APPENDECTOMY    . BREAST REDUCTION SURGERY Bilateral   . COLONOSCOPY WITH PROPOFOL N/A 11/05/2018   Procedure: COLONOSCOPY WITH PROPOFOL;  Surgeon: Virgel Manifold, MD;  Location: [facility] ENDOSCOPY;  Service: Endoscopy;  Laterality: N/A;  . COLONOSCOPY WITH PROPOFOL N/A 11/06/2018   Procedure: COLONOSCOPY WITH PROPOFOL;  Surgeon: Virgel Manifold, MD;  Location: [facility] ENDOSCOPY;  Service: Endoscopy;  Laterality: N/A;  . COLOSTOMY    . DILATION AND CURETTAGE OF UTERUS    . ESOPHAGOGASTRODUODENOSCOPY (EGD) WITH PROPOFOL N/A  11/05/2018   Procedure: ESOPHAGOGASTRODUODENOSCOPY (EGD) WITH PROPOFOL;  Surgeon: Virgel Manifold, MD;  Location: [facility] ENDOSCOPY;  Service: Endoscopy;  Laterality: N/A;  . ESOPHAGOGASTRODUODENOSCOPY (EGD) WITH PROPOFOL N/A 03/27/2019   Procedure: ESOPHAGOGASTRODUODENOSCOPY (EGD) WITH PROPOFOL;  Surgeon: Lucilla Lame, MD;  Location: [facility] ENDOSCOPY;  Service: Endoscopy;  Laterality: N/A;  . ILEOSTOMY Right 11/27/2018   Procedure: ILEOSTOMY;  Surgeon: Jules Husbands, MD;  Location: [facility] ORS;  Service: General;  Laterality: Right;  . LAPAROSCOPIC SIGMOID COLECTOMY N/A 11/27/2018   Procedure: LAPAROSCOPIC SIGMOID COLECTOMY;  Surgeon: Jules Husbands, MD;  Location: [facility] ORS;  Service: General;  Laterality: N/A;  . ORIF ANKLE FRACTURE Right 11/21/2018   Procedure: OPEN REDUCTION INTERNAL FIXATION (ORIF) ANKLE FRACTURE;  Surgeon: Corky Mull, MD;  Location: [facility] ORS;  Service: Orthopedics;  Laterality: Right;  . PORTACATH PLACEMENT N/A 03/12/2019   Procedure: INSERTION PORT-A-CATH;  Surgeon: Jules Husbands, MD;  Location: [facility] ORS;  Service: General;  Laterality: N/A;  . REDUCTION MAMMAPLASTY    . SHOULDER ARTHROSCOPY WITH OPEN ROTATOR CUFF REPAIR AND DISTAL CLAVICLE ACROMINECTOMY Right 03/26/2017   Procedure: right shoulder arthroscopy, arthroscopic subacromial decompression, distal clavicle excision, mini open rotator cuff repair;  Surgeon: Thornton Park, MD;  Location: [facility] ORS;  Service: Orthopedics;  Laterality: Right;    Social History   Socioeconomic History  . Marital status: Divorced    Spouse name: Not on file  . Number of children: 3  . Years of education: Not on file  . Highest education level: 11th grade  Occupational History  . Not on file  Social Needs  . Financial  resource strain: Somewhat hard  . Food insecurity    Worry: Sometimes true    Inability: Never true  . Transportation needs    Medical: Yes    Non-medical: No  Tobacco Use  . Smoking status: Former Smoker     Packs/day: 1.00    Types: Cigarettes    Quit date: 2010    Years since quitting: 10.6  . Smokeless tobacco: Never Used  . Tobacco comment: quit over 30 years ago   Substance and Sexual Activity  . Alcohol use: No  . Drug use: Yes    Types: Marijuana  . Sexual activity: Never  Lifestyle  . Physical activity    Days per week: 0 days    Minutes per session: 0 min  . Stress: Not at all  Relationships  . Social Herbalist on phone: Never    Gets together: Never    Attends religious service: More than 4 times per year    Active member of club or organization: No    Attends meetings of clubs or organizations: Never    Relationship status: Divorced  . Intimate partner violence    Fear of current or ex partner: No    Emotionally abused: No    Physically abused: No    Forced sexual activity: No  Other Topics Concern  . 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 She receives disability. She has 3 children and several grandchildren. She lives alone. She drives. Uses a walker and shower chair at baseline.     Family History  Problem Relation Age of Onset  . Hypertension Mother   . Breast cancer Mother 54  . Breast cancer Sister 52     Current Outpatient Medications:  .  albuterol (VENTOLIN HFA) 108 (90 Base) MCG/ACT inhaler, Inhale 2 puffs into the lungs every 6 (six) hours as needed for wheezing or shortness of breath., Disp: 18 g, Rfl: 0 .  amLODipine (NORVASC) 10 MG tablet, Take 1 tablet (10 mg total) by mouth daily., Disp: 30 tablet, Rfl: 0 .  benzonatate (TESSALON) 200 MG capsule, Take 1 capsule (200 mg total) by mouth 3 (three) times daily as needed for cough., Disp: 60 capsule, Rfl: 0 .  BREO ELLIPTA 200-25 MCG/INH AEPB, Inhale 1 puff into the lungs daily., Disp: 60 each, Rfl: 0 .  clonazePAM (KLONOPIN) 0.5 MG tablet, Take 1 tablet (0.5 mg total) by mouth 3 (three) times daily., Disp: 30 tablet, Rfl: 0 .  cloNIDine  (CATAPRES) 0.2 MG tablet, Take 1 tablet (0.2 mg total) by mouth 2 (two) times daily., Disp: 60 tablet, Rfl: 0 .  hydrALAZINE (APRESOLINE) 100 MG tablet, Take 1 tablet (100 mg total) by mouth 3 (three) times daily., Disp: 90 tablet, Rfl: 0 .  HYDROcodone-homatropine (HYCODAN) 5-1.5 MG/5ML syrup, Take 5 mLs by mouth every 6 (six) hours as needed for cough., Disp: 120 mL, Rfl: 0 .  hydrOXYzine (ATARAX/VISTARIL) 10 MG tablet, Take 1 tablet (10 mg total) by mouth 3 (three) times daily as needed for itching., Disp: 30 tablet, Rfl: 0 .  loperamide (IMODIUM) 2 MG capsule, Take 2 capsules (4 mg total) by mouth 3 (three) times daily., Disp: 180 capsule, Rfl: 0 .  lovastatin (MEVACOR) 40 MG tablet, Take 1 tablet (40 mg total) by mouth at bedtime., Disp: 30 tablet, Rfl: 0 .  meclizine (ANTIVERT) 12.5 MG tablet, Take 1 tablet (12.5 mg total) by mouth 3 (  three) times daily as needed for dizziness., Disp: 30 tablet, Rfl: 0 .  methocarbamol (ROBAXIN) 500 MG tablet, Take 1 tablet (500 mg total) by mouth at bedtime as needed for muscle spasms., Disp: 30 tablet, Rfl: 0 .  metoprolol succinate (TOPROL-XL) 100 MG 24 hr tablet, Take 1 tablet (100 mg total) by mouth daily. Take with or immediately following a meal., Disp: 30 tablet, Rfl: 0 .  mirtazapine (REMERON) 15 MG tablet, take 1 tablet by mouth at bedtime for APPETITIE, Disp: 30 tablet, Rfl: 0 .  montelukast (SINGULAIR) 10 MG tablet, Take 1 tablet (10 mg total) by mouth at bedtime as needed., Disp: 30 tablet, Rfl: 0 .  OLANZapine (ZYPREXA) 10 MG tablet, Take 1 tablet (10 mg total) by mouth at bedtime., Disp: 30 tablet, Rfl: 0 .  omeprazole (PRILOSEC) 40 MG capsule, Take 1 capsule (40 mg total) by mouth 2 (two) times daily., Disp: 60 capsule, Rfl: 0 .  ondansetron (ZOFRAN) 8 MG tablet, Take 1 tablet (8 mg total) by mouth 2 (two) times daily as needed for refractory nausea / vomiting. Start on day 3 after carboplatin chemo., Disp: 30 tablet, Rfl: 0 .  oxybutynin  (DITROPAN) 5 MG tablet, Take 1 tablet (5 mg total) by mouth 2 (two) times daily., Disp: 60 tablet, Rfl: 0 .  potassium chloride SA (K-DUR) 20 MEQ tablet, Take 1 tablet (20 mEq total) by mouth daily., Disp: 20 tablet, Rfl: 0 .  prochlorperazine (COMPAZINE) 10 MG tablet, Take 1 tablet (10 mg total) by mouth every 6 (six) hours as needed (Nausea or vomiting)., Disp: 30 tablet, Rfl: 1 .  sertraline (ZOLOFT) 100 MG tablet, Take 2 tablets (200 mg total) by mouth at bedtime., Disp: 60 tablet, Rfl: 0 .  terazosin (HYTRIN) 1 MG capsule, Take 1 capsule (1 mg total) by mouth at bedtime., Disp: 30 capsule, Rfl: 0 .  tiZANidine (ZANAFLEX) 2 MG tablet, Take 1 tablet (2 mg total) by mouth every 6 (six) hours as needed for muscle spasms., Disp: 30 tablet, Rfl: 0 .  traZODone (DESYREL) 50 MG tablet, Take 0.5-1 tablets (25-50 mg total) by mouth at bedtime as needed for sleep., Disp: 30 tablet, Rfl: 0 .  triamcinolone cream (KENALOG) 0.1 %, Apply 1 application topically 2 (two) times daily as needed (itching)., Disp: 15 g, Rfl: 0 No current facility-administered medications for this visit.   Facility-Administered Medications Ordered in Other Visits:  .  etoposide (VEPESID) 180 mg in sodium chloride 0.9 % 500 mL chemo infusion, 100 mg/m2 (Treatment Plan Recorded), Intravenous, Once, Sindy Guadeloupe, MD, Last Rate: 509 mL/hr at 05/20/19 1130, 180 mg at 05/20/19 1130 .  [COMPLETED] heparin lock flush 100 unit/mL, 500 Units, Intravenous, Once, Sindy Guadeloupe, MD, 500 Units at 05/20/19 1235 .  potassium chloride 20 mEq in 100 mL IVPB, 20 mEq, Intravenous, Once, Sindy Guadeloupe, MD, Last Rate: 100 mL/hr at 05/20/19 1127, 20 mEq at 05/20/19 1127 .  sodium chloride flush (NS) 0.9 % injection 10 mL, 10 mL, Intravenous, PRN, Verlon Au, NP, 10 mL at 04/13/19 1430 .  sodium chloride flush (NS) 0.9 % injection 10 mL, 10 mL, Intravenous, PRN, Sindy Guadeloupe, MD, 10 mL at 05/20/19 1055  Physical exam:  Vitals:   05/19/19 1032   BP: (!) 160/83  Pulse: 71  Temp: (!) 97.4 F (36.3 C)  Weight: 150 lb (68 kg)   Physical Exam Constitutional:      Comments: She is sitting in a wheelchair. Appears fatigued  HENT:     Head: Normocephalic and atraumatic.  Eyes:     Pupils: Pupils are equal, round, and reactive to light.  Neck:     Musculoskeletal: Normal range of motion.  Cardiovascular:     Rate and Rhythm: Normal rate and regular rhythm.     Heart sounds: Normal heart sounds.  Pulmonary:     Effort: Pulmonary effort is normal.     Breath sounds: Normal breath sounds.  Abdominal:     General: Bowel sounds are normal.     Palpations: Abdomen is soft.  Skin:    General: Skin is warm and dry.  Neurological:     Mental Status: She is alert and oriented to person, place, and time.      CMP Latest Ref Rng & Units 05/19/2019  Glucose 70 - 99 mg/dL 124(H)  BUN 8 - 23 mg/dL 10  Creatinine 0.44 - 1.00 mg/dL 0.69  Sodium 135 - 145 mmol/L 140  Potassium 3.5 - 5.1 mmol/L 2.9(L)  Chloride 98 - 111 mmol/L 104  CO2 22 - 32 mmol/L 24  Calcium 8.9 - 10.3 mg/dL 9.0  Total Protein 6.5 - 8.1 g/dL 7.1  Total Bilirubin 0.3 - 1.2 mg/dL 0.4  Alkaline Phos 38 - 126 U/L 83  AST 15 - 41 U/L 28  ALT 0 - 44 U/L 22   CBC Latest Ref Rng & Units 05/19/2019  WBC 4.0 - 10.5 K/uL 9.2  Hemoglobin 12.0 - 15.0 g/dL 8.4(L)  Hematocrit 36.0 - 46.0 % 25.4(L)  Platelets 150 - 400 K/uL 324    No images are attached to the encounter.  Dg Chest 2 View  Result Date: 04/29/2019 CLINICAL DATA:  Chest pain and cough.  History of colon carcinoma EXAM: CHEST - 2 VIEW COMPARISON:  March 12, 2019 FINDINGS: Port-A-Cath tip is in the superior vena cava. No pneumothorax. Lungs are clear. The heart size and pulmonary vascularity are normal. No evident adenopathy. There are foci of degenerative change in the thoracic spine. No blastic or lytic bone lesions. Postoperative change noted in the right humeral head region. IMPRESSION: Port-A-Cath tip in  superior vena cava. No pneumothorax. No edema or consolidation. No neoplastic focus demonstrable. No adenopathy. Electronically Signed   By: Lowella Grip III M.D.   On: 04/29/2019 09:52     Assessment and plan- Patient is a 73 y.o. female with h/o stage III colon cancer s/p rescetion and now with limited stage small cell lung cancer. She has completed her Rt and is here for on treatment assessment prior to cycle 4 day 1 of carboplatin and etoposide  1. Counts ok to proceed with cycle 4 day 1 of carboplatin and etoposide today. She will receive etoposide on day 2 and day 3 and onpro neulasta on day 3  2. Chemo induced anemia: continue to monitor. This is her last chemotherapy and her counts should improve over next 2-3 months  3. Hypokalemia: will receive 20 meq IV potassium today and next 2 days  4. Chronic low back pain: this has been an ongoing issue even before her lung cancer diagnosis. No evidence of malignancy in her spine. This is musculoskeletal pain. She has been on tramadol, muscle relaxers not prescribed by oncology. I explained to the patient that I will not be managing her non malignancy related back pain and she will need to see ortho or pain clinic for this. Patient does not wish to undergo any invasive intervention for her back pain and hence does  not wish to see ortho. She is willing to seek pain clinic opinion and I will refer her to pain clinic for the same.  I will see her back in 10 days with cbc with diff for possible IVF. She receives IVF today  CT chest abdomen pelvis with contrast 2 weeks from now     Visit Diagnosis 1. Small cell lung cancer ([place])   2. Encounter for antineoplastic chemotherapy   3. Chronic low back pain, unspecified back pain laterality, unspecified whether sciatica present   4. Hypokalemia      Dr. Randa Evens, MD, MPH [facility] [phone] 05/20/2019 12:23 PM

## 2019-05-21 ENCOUNTER — Other Ambulatory Visit: Payer: Self-pay

## 2019-05-21 ENCOUNTER — Inpatient Hospital Stay: Payer: Medicare Other

## 2019-05-21 VITALS — BP 150/71 | HR 84

## 2019-05-21 DIAGNOSIS — Z5111 Encounter for antineoplastic chemotherapy: Secondary | ICD-10-CM | POA: Diagnosis not present

## 2019-05-21 DIAGNOSIS — M533 Sacrococcygeal disorders, not elsewhere classified: Secondary | ICD-10-CM | POA: Diagnosis not present

## 2019-05-21 DIAGNOSIS — M5136 Other intervertebral disc degeneration, lumbar region: Secondary | ICD-10-CM | POA: Diagnosis not present

## 2019-05-21 DIAGNOSIS — Z433 Encounter for attention to colostomy: Secondary | ICD-10-CM | POA: Diagnosis not present

## 2019-05-21 DIAGNOSIS — Z79899 Other long term (current) drug therapy: Secondary | ICD-10-CM | POA: Diagnosis not present

## 2019-05-21 DIAGNOSIS — T451X5S Adverse effect of antineoplastic and immunosuppressive drugs, sequela: Secondary | ICD-10-CM | POA: Diagnosis not present

## 2019-05-21 DIAGNOSIS — C3490 Malignant neoplasm of unspecified part of unspecified bronchus or lung: Secondary | ICD-10-CM

## 2019-05-21 DIAGNOSIS — C19 Malignant neoplasm of rectosigmoid junction: Secondary | ICD-10-CM | POA: Diagnosis not present

## 2019-05-21 DIAGNOSIS — J45909 Unspecified asthma, uncomplicated: Secondary | ICD-10-CM | POA: Diagnosis not present

## 2019-05-21 DIAGNOSIS — E876 Hypokalemia: Secondary | ICD-10-CM | POA: Diagnosis not present

## 2019-05-21 DIAGNOSIS — M549 Dorsalgia, unspecified: Secondary | ICD-10-CM | POA: Diagnosis not present

## 2019-05-21 DIAGNOSIS — M1990 Unspecified osteoarthritis, unspecified site: Secondary | ICD-10-CM | POA: Diagnosis not present

## 2019-05-21 DIAGNOSIS — M48061 Spinal stenosis, lumbar region without neurogenic claudication: Secondary | ICD-10-CM | POA: Diagnosis not present

## 2019-05-21 DIAGNOSIS — Z7689 Persons encountering health services in other specified circumstances: Secondary | ICD-10-CM | POA: Diagnosis not present

## 2019-05-21 DIAGNOSIS — Z87440 Personal history of urinary (tract) infections: Secondary | ICD-10-CM | POA: Diagnosis not present

## 2019-05-21 DIAGNOSIS — I10 Essential (primary) hypertension: Secondary | ICD-10-CM | POA: Diagnosis not present

## 2019-05-21 DIAGNOSIS — Z85038 Personal history of other malignant neoplasm of large intestine: Secondary | ICD-10-CM | POA: Diagnosis not present

## 2019-05-21 DIAGNOSIS — M79606 Pain in leg, unspecified: Secondary | ICD-10-CM | POA: Diagnosis not present

## 2019-05-21 DIAGNOSIS — Z87891 Personal history of nicotine dependence: Secondary | ICD-10-CM | POA: Diagnosis not present

## 2019-05-21 DIAGNOSIS — M4316 Spondylolisthesis, lumbar region: Secondary | ICD-10-CM | POA: Diagnosis not present

## 2019-05-21 DIAGNOSIS — M19011 Primary osteoarthritis, right shoulder: Secondary | ICD-10-CM | POA: Diagnosis not present

## 2019-05-21 DIAGNOSIS — K219 Gastro-esophageal reflux disease without esophagitis: Secondary | ICD-10-CM | POA: Diagnosis not present

## 2019-05-21 DIAGNOSIS — C3400 Malignant neoplasm of unspecified main bronchus: Secondary | ICD-10-CM | POA: Diagnosis not present

## 2019-05-21 DIAGNOSIS — D6481 Anemia due to antineoplastic chemotherapy: Secondary | ICD-10-CM | POA: Diagnosis not present

## 2019-05-21 DIAGNOSIS — D72829 Elevated white blood cell count, unspecified: Secondary | ICD-10-CM | POA: Diagnosis not present

## 2019-05-21 DIAGNOSIS — E860 Dehydration: Secondary | ICD-10-CM | POA: Diagnosis not present

## 2019-05-21 DIAGNOSIS — G8929 Other chronic pain: Secondary | ICD-10-CM | POA: Diagnosis not present

## 2019-05-21 DIAGNOSIS — M4802 Spinal stenosis, cervical region: Secondary | ICD-10-CM | POA: Diagnosis not present

## 2019-05-21 DIAGNOSIS — M4810 Ankylosing hyperostosis [Forestier], site unspecified: Secondary | ICD-10-CM | POA: Diagnosis not present

## 2019-05-21 DIAGNOSIS — Z9221 Personal history of antineoplastic chemotherapy: Secondary | ICD-10-CM | POA: Diagnosis not present

## 2019-05-21 DIAGNOSIS — M5030 Other cervical disc degeneration, unspecified cervical region: Secondary | ICD-10-CM | POA: Diagnosis not present

## 2019-05-21 MED ORDER — DIPHENHYDRAMINE HCL 25 MG PO CAPS
ORAL_CAPSULE | ORAL | Status: AC
  Filled 2019-05-21: qty 1

## 2019-05-21 MED ORDER — SODIUM CHLORIDE 0.9 % IV SOLN
100.0000 mg/m2 | Freq: Once | INTRAVENOUS | Status: AC
  Administered 2019-05-21: 180 mg via INTRAVENOUS
  Filled 2019-05-21: qty 9

## 2019-05-21 MED ORDER — SODIUM CHLORIDE 0.9 % IV SOLN
Freq: Once | INTRAVENOUS | Status: AC
  Administered 2019-05-21: 10:00:00 via INTRAVENOUS
  Filled 2019-05-21: qty 250

## 2019-05-21 MED ORDER — DIPHENHYDRAMINE HCL 25 MG PO TABS
25.0000 mg | ORAL_TABLET | Freq: Once | ORAL | Status: AC
  Administered 2019-05-21: 25 mg via ORAL
  Filled 2019-05-21: qty 1

## 2019-05-21 MED ORDER — DEXAMETHASONE SODIUM PHOSPHATE 10 MG/ML IJ SOLN
10.0000 mg | Freq: Once | INTRAMUSCULAR | Status: AC
  Administered 2019-05-21: 10 mg via INTRAVENOUS
  Filled 2019-05-21: qty 1

## 2019-05-21 MED ORDER — PEGFILGRASTIM 6 MG/0.6ML ~~LOC~~ PSKT
6.0000 mg | PREFILLED_SYRINGE | Freq: Once | SUBCUTANEOUS | Status: AC
  Administered 2019-05-21: 6 mg via SUBCUTANEOUS
  Filled 2019-05-21: qty 0.6

## 2019-05-21 MED ORDER — HEPARIN SOD (PORK) LOCK FLUSH 100 UNIT/ML IV SOLN
500.0000 [IU] | Freq: Once | INTRAVENOUS | Status: AC | PRN
  Administered 2019-05-21: 500 [IU]
  Filled 2019-05-21: qty 5

## 2019-05-26 DIAGNOSIS — S82841S Displaced bimalleolar fracture of right lower leg, sequela: Secondary | ICD-10-CM | POA: Diagnosis not present

## 2019-05-26 DIAGNOSIS — I10 Essential (primary) hypertension: Secondary | ICD-10-CM | POA: Diagnosis not present

## 2019-05-27 ENCOUNTER — Telehealth: Payer: Self-pay | Admitting: Surgery

## 2019-05-27 MED ORDER — LOPERAMIDE HCL 2 MG PO CAPS: 4.0000 mg | ORAL_CAPSULE | Freq: Three times a day (TID) | ORAL | 3 refills | Status: DC

## 2019-05-27 NOTE — Telephone Encounter (Signed)
I talked with the patient and she states she has similar pain when her potassium is low and will notify Dr Janese Banks. In reviewing her medications she has Robaxin as well. She would like a refill on her Imodium to help with her stools.May send in refills for Imodium per Dr Dahlia Byes.

## 2019-05-27 NOTE — Telephone Encounter (Signed)
Telephone Triage Questions    Type of surgery?     INSERTION PORT-A-CATH                   Date?          03/12/2019                    Physician?     Dr Dahlia Byes   Pain ? Lower back, and under hips on both sides  Where and how severe, (1-10)  8  Constipation / Diarrhea? no   Have you tried Ibuprofen/Tylenol combination for your pain? Tried ibuprofen didn't help   Patient also was calling about getting a refill on her meds that makes up bowels due to her coloscopy bag.  Please call patient and advise.

## 2019-05-28 ENCOUNTER — Other Ambulatory Visit: Payer: Self-pay

## 2019-05-28 ENCOUNTER — Ambulatory Visit
Admission: RE | Admit: 2019-05-28 | Discharge: 2019-05-28 | Disposition: A | Discharge status: Home / Self Care | Payer: Medicare Other | Source: Ambulatory Visit | Attending: Radiation Oncology | Admitting: Radiation Oncology

## 2019-05-28 ENCOUNTER — Encounter: Payer: Self-pay | Admitting: Radiation Oncology

## 2019-05-28 VITALS — BP 188/64 | HR 98 | Temp 97.6°F | Wt 153.0 lb

## 2019-05-28 DIAGNOSIS — C3411 Malignant neoplasm of upper lobe, right bronchus or lung: Secondary | ICD-10-CM | POA: Insufficient documentation

## 2019-05-28 DIAGNOSIS — Z87891 Personal history of nicotine dependence: Secondary | ICD-10-CM | POA: Insufficient documentation

## 2019-05-28 DIAGNOSIS — R52 Pain, unspecified: Secondary | ICD-10-CM | POA: Diagnosis not present

## 2019-05-28 DIAGNOSIS — Z923 Personal history of irradiation: Secondary | ICD-10-CM | POA: Insufficient documentation

## 2019-05-28 DIAGNOSIS — C3490 Malignant neoplasm of unspecified part of unspecified bronchus or lung: Secondary | ICD-10-CM

## 2019-05-28 DIAGNOSIS — Z9221 Personal history of antineoplastic chemotherapy: Secondary | ICD-10-CM | POA: Insufficient documentation

## 2019-05-28 NOTE — Progress Notes (Signed)
Radiation Oncology Follow up Note  Name: Gloria Allen   Date:   05/28/2019 MRN:  [id number] DOB: 1945-12-04    This 73 y.o. female presents to the clinic today for  macro for1 month follow-up status post concurrent chemo1 month follow-up status post completion of radiation therapy with concurrent chemotherapy for stage I limited stage small cell lung cancer.  REFERRING PROVIDER: [facility], [place]  HPI: Patient is a 73 year old female now at 1 month having completed radiation therapy with concurrent chemotherapy for stage I limited stage small cell lung cancer..  She received concurrent chemo therapy consisting of carboplatin etoposide.  Her major complaint is body pain.  She also has a mild nonproductive cough.  No dysphasia..  She is being seen tomorrow in symptom clinic for possible dehydration to receive fluids and for pain management.  She specifically denies cough hemoptysis or chest tightness.  COMPLICATIONS OF TREATMENT: none  FOLLOW UP COMPLIANCE: keeps appointments   PHYSICAL EXAM:  BP (!) 188/64   Pulse 98   Temp 97.6 F (36.4 C)   Wt 153 lb (69.4 kg)   BMI 25.46 kg/m  Well-developed well-nourished patient in NAD. HEENT reveals PERLA, EOMI, discs not visualized.  Oral cavity is clear. No oral mucosal lesions are identified. Neck is clear without evidence of cervical or supraclavicular adenopathy. Lungs are clear to A&P. Cardiac examination is essentially unremarkable with regular rate and rhythm without murmur rub or thrill. Abdomen is benign with no organomegaly or masses noted. Motor sensory and DTR levels are equal and symmetric in the upper and lower extremities. Cranial nerves II through XII are grossly intact. Proprioception is intact. No peripheral adenopathy or edema is identified. No motor or sensory levels are noted. Crude visual fields are within normal range.  RADIOLOGY RESULTS: CT scan of the chest has been ordered for early September which I will  review  PLAN: Present time patient from pulmonary standpoint is stable.  She will be seen symptom management tomorrow for consideration of fluids and possible pain medication for some of her somatic complaints.  I am otherwise pleased with her overall progress I will review her CT scans in early September.  I have asked to see her back in 3 to 4 months for follow-up.  Should her scans show excellent results will consider PCI at some point and will discuss that with medical oncology.  I would like to take this opportunity to thank you for allowing me to participate in the care of your patient.Noreene Filbert, MD

## 2019-05-29 ENCOUNTER — Inpatient Hospital Stay: Payer: Medicare Other

## 2019-05-29 ENCOUNTER — Telehealth: Payer: Self-pay | Admitting: *Deleted

## 2019-05-29 ENCOUNTER — Other Ambulatory Visit: Payer: Self-pay

## 2019-05-29 ENCOUNTER — Other Ambulatory Visit: Payer: Self-pay | Admitting: *Deleted

## 2019-05-29 VITALS — BP 164/76 | HR 102 | Temp 97.2°F | Resp 18

## 2019-05-29 DIAGNOSIS — C3490 Malignant neoplasm of unspecified part of unspecified bronchus or lung: Secondary | ICD-10-CM | POA: Diagnosis not present

## 2019-05-29 DIAGNOSIS — I10 Essential (primary) hypertension: Secondary | ICD-10-CM | POA: Diagnosis not present

## 2019-05-29 DIAGNOSIS — M48061 Spinal stenosis, lumbar region without neurogenic claudication: Secondary | ICD-10-CM | POA: Diagnosis not present

## 2019-05-29 DIAGNOSIS — D72829 Elevated white blood cell count, unspecified: Secondary | ICD-10-CM | POA: Diagnosis not present

## 2019-05-29 DIAGNOSIS — E876 Hypokalemia: Secondary | ICD-10-CM | POA: Diagnosis not present

## 2019-05-29 DIAGNOSIS — J45909 Unspecified asthma, uncomplicated: Secondary | ICD-10-CM | POA: Diagnosis not present

## 2019-05-29 DIAGNOSIS — K219 Gastro-esophageal reflux disease without esophagitis: Secondary | ICD-10-CM | POA: Diagnosis not present

## 2019-05-29 DIAGNOSIS — Z7689 Persons encountering health services in other specified circumstances: Secondary | ICD-10-CM | POA: Diagnosis not present

## 2019-05-29 DIAGNOSIS — T451X5S Adverse effect of antineoplastic and immunosuppressive drugs, sequela: Secondary | ICD-10-CM | POA: Diagnosis not present

## 2019-05-29 DIAGNOSIS — Z9221 Personal history of antineoplastic chemotherapy: Secondary | ICD-10-CM | POA: Diagnosis not present

## 2019-05-29 DIAGNOSIS — G8929 Other chronic pain: Secondary | ICD-10-CM | POA: Diagnosis not present

## 2019-05-29 DIAGNOSIS — M1990 Unspecified osteoarthritis, unspecified site: Secondary | ICD-10-CM | POA: Diagnosis not present

## 2019-05-29 DIAGNOSIS — M5136 Other intervertebral disc degeneration, lumbar region: Secondary | ICD-10-CM | POA: Diagnosis not present

## 2019-05-29 DIAGNOSIS — Z87891 Personal history of nicotine dependence: Secondary | ICD-10-CM | POA: Diagnosis not present

## 2019-05-29 DIAGNOSIS — M549 Dorsalgia, unspecified: Secondary | ICD-10-CM | POA: Diagnosis not present

## 2019-05-29 DIAGNOSIS — C19 Malignant neoplasm of rectosigmoid junction: Secondary | ICD-10-CM | POA: Diagnosis not present

## 2019-05-29 DIAGNOSIS — M79606 Pain in leg, unspecified: Secondary | ICD-10-CM | POA: Diagnosis not present

## 2019-05-29 DIAGNOSIS — E860 Dehydration: Secondary | ICD-10-CM | POA: Diagnosis not present

## 2019-05-29 DIAGNOSIS — Z5111 Encounter for antineoplastic chemotherapy: Secondary | ICD-10-CM | POA: Diagnosis not present

## 2019-05-29 DIAGNOSIS — D6481 Anemia due to antineoplastic chemotherapy: Secondary | ICD-10-CM | POA: Diagnosis not present

## 2019-05-29 DIAGNOSIS — Z79899 Other long term (current) drug therapy: Secondary | ICD-10-CM | POA: Diagnosis not present

## 2019-05-29 LAB — BASIC METABOLIC PANEL
Anion gap: 12 (ref 5–15)
BUN: 10 mg/dL (ref 8–23)
CO2: 23 mmol/L (ref 22–32)
Calcium: 9 mg/dL (ref 8.9–10.3)
Chloride: 103 mmol/L (ref 98–111)
Creatinine, Ser: 0.75 mg/dL (ref 0.44–1.00)
GFR calc Af Amer: >60 mL/min (ref >60)
GFR calc non Af Amer: >60 mL/min (ref >60)
Glucose, Bld: 138 mg/dL — ABNORMAL HIGH (ref 70–99)
Potassium: 3 mmol/L — ABNORMAL LOW (ref 3.5–5.1)
Sodium: 138 mmol/L (ref 135–145)

## 2019-05-29 MED ORDER — SODIUM CHLORIDE 0.9 % IV SOLN
Freq: Once | INTRAVENOUS | Status: AC
  Administered 2019-05-29: 14:00:00 via INTRAVENOUS
  Filled 2019-05-29: qty 250

## 2019-05-29 MED ORDER — HEPARIN SOD (PORK) LOCK FLUSH 100 UNIT/ML IV SOLN
500.0000 [IU] | Freq: Once | INTRAVENOUS | Status: AC
  Administered 2019-05-29: 500 [IU] via INTRAVENOUS

## 2019-05-29 MED ORDER — POTASSIUM CHLORIDE 20 MEQ/100ML IV SOLN
20.0000 meq | Freq: Once | INTRAVENOUS | Status: AC
  Administered 2019-05-29: 20 meq via INTRAVENOUS

## 2019-05-29 MED ORDER — SODIUM CHLORIDE 0.9 % IV SOLN
Freq: Once | INTRAVENOUS | Status: DC
  Filled 2019-05-29: qty 1000

## 2019-05-29 NOTE — Progress Notes (Signed)
Pt to receive 1 liter NS over one hour and 20 MEQs IV potassium over one hour (infuse through port) per Dr. Janese Banks. Pt agrees with plan.   Pt stable at discharge.

## 2019-06-01 ENCOUNTER — Other Ambulatory Visit: Payer: Self-pay | Admitting: Nurse Practitioner

## 2019-06-03 DIAGNOSIS — Z1389 Encounter for screening for other disorder: Secondary | ICD-10-CM | POA: Diagnosis not present

## 2019-06-03 DIAGNOSIS — I10 Essential (primary) hypertension: Secondary | ICD-10-CM | POA: Diagnosis not present

## 2019-06-03 DIAGNOSIS — C3490 Malignant neoplasm of unspecified part of unspecified bronchus or lung: Secondary | ICD-10-CM | POA: Diagnosis not present

## 2019-06-03 DIAGNOSIS — Z604 Social exclusion and rejection: Secondary | ICD-10-CM | POA: Diagnosis not present

## 2019-06-03 DIAGNOSIS — R7309 Other abnormal glucose: Secondary | ICD-10-CM | POA: Diagnosis not present

## 2019-06-03 DIAGNOSIS — E559 Vitamin D deficiency, unspecified: Secondary | ICD-10-CM | POA: Diagnosis not present

## 2019-06-03 DIAGNOSIS — S82891A Other fracture of right lower leg, initial encounter for closed fracture: Secondary | ICD-10-CM | POA: Diagnosis not present

## 2019-06-04 ENCOUNTER — Other Ambulatory Visit: Payer: Self-pay

## 2019-06-05 ENCOUNTER — Ambulatory Visit
Admission: RE | Admit: 2019-06-05 | Discharge: 2019-06-05 | Disposition: A | Discharge status: Home / Self Care | Payer: Medicare Other | Source: Ambulatory Visit | Attending: Oncology | Admitting: Oncology

## 2019-06-05 ENCOUNTER — Other Ambulatory Visit: Payer: Self-pay

## 2019-06-05 ENCOUNTER — Inpatient Hospital Stay: Payer: Medicare Other

## 2019-06-05 ENCOUNTER — Inpatient Hospital Stay: Payer: Medicare Other | Attending: Oncology

## 2019-06-05 DIAGNOSIS — R5383 Other fatigue: Secondary | ICD-10-CM | POA: Insufficient documentation

## 2019-06-05 DIAGNOSIS — Z7951 Long term (current) use of inhaled steroids: Secondary | ICD-10-CM | POA: Diagnosis not present

## 2019-06-05 DIAGNOSIS — Z87440 Personal history of urinary (tract) infections: Secondary | ICD-10-CM | POA: Diagnosis not present

## 2019-06-05 DIAGNOSIS — M1990 Unspecified osteoarthritis, unspecified site: Secondary | ICD-10-CM | POA: Insufficient documentation

## 2019-06-05 DIAGNOSIS — M4816 Ankylosing hyperostosis [Forestier], lumbar region: Secondary | ICD-10-CM | POA: Insufficient documentation

## 2019-06-05 DIAGNOSIS — C3491 Malignant neoplasm of unspecified part of right bronchus or lung: Secondary | ICD-10-CM | POA: Diagnosis not present

## 2019-06-05 DIAGNOSIS — M549 Dorsalgia, unspecified: Secondary | ICD-10-CM | POA: Insufficient documentation

## 2019-06-05 DIAGNOSIS — M4812 Ankylosing hyperostosis [Forestier], cervical region: Secondary | ICD-10-CM | POA: Insufficient documentation

## 2019-06-05 DIAGNOSIS — C3411 Malignant neoplasm of upper lobe, right bronchus or lung: Secondary | ICD-10-CM | POA: Insufficient documentation

## 2019-06-05 DIAGNOSIS — J45909 Unspecified asthma, uncomplicated: Secondary | ICD-10-CM | POA: Insufficient documentation

## 2019-06-05 DIAGNOSIS — K219 Gastro-esophageal reflux disease without esophagitis: Secondary | ICD-10-CM | POA: Insufficient documentation

## 2019-06-05 DIAGNOSIS — C3490 Malignant neoplasm of unspecified part of unspecified bronchus or lung: Secondary | ICD-10-CM | POA: Diagnosis not present

## 2019-06-05 DIAGNOSIS — Z9049 Acquired absence of other specified parts of digestive tract: Secondary | ICD-10-CM | POA: Insufficient documentation

## 2019-06-05 DIAGNOSIS — M5031 Other cervical disc degeneration,  high cervical region: Secondary | ICD-10-CM | POA: Insufficient documentation

## 2019-06-05 DIAGNOSIS — C19 Malignant neoplasm of rectosigmoid junction: Secondary | ICD-10-CM | POA: Insufficient documentation

## 2019-06-05 DIAGNOSIS — Z87891 Personal history of nicotine dependence: Secondary | ICD-10-CM | POA: Diagnosis not present

## 2019-06-05 DIAGNOSIS — F419 Anxiety disorder, unspecified: Secondary | ICD-10-CM | POA: Diagnosis not present

## 2019-06-05 DIAGNOSIS — F329 Major depressive disorder, single episode, unspecified: Secondary | ICD-10-CM | POA: Diagnosis not present

## 2019-06-05 DIAGNOSIS — Z79899 Other long term (current) drug therapy: Secondary | ICD-10-CM | POA: Diagnosis not present

## 2019-06-05 DIAGNOSIS — Z9221 Personal history of antineoplastic chemotherapy: Secondary | ICD-10-CM | POA: Diagnosis not present

## 2019-06-05 DIAGNOSIS — E876 Hypokalemia: Secondary | ICD-10-CM | POA: Diagnosis not present

## 2019-06-05 DIAGNOSIS — G8929 Other chronic pain: Secondary | ICD-10-CM | POA: Insufficient documentation

## 2019-06-05 DIAGNOSIS — I10 Essential (primary) hypertension: Secondary | ICD-10-CM | POA: Insufficient documentation

## 2019-06-05 LAB — COMPREHENSIVE METABOLIC PANEL
ALT: 25 U/L (ref 0–44)
AST: 24 U/L (ref 15–41)
Albumin: 4.1 g/dL (ref 3.5–5.0)
Alkaline Phosphatase: 116 U/L (ref 38–126)
Anion gap: 10 (ref 5–15)
BUN: 6 mg/dL — ABNORMAL LOW (ref 8–23)
CO2: 27 mmol/L (ref 22–32)
Calcium: 9.1 mg/dL (ref 8.9–10.3)
Chloride: 100 mmol/L (ref 98–111)
Creatinine, Ser: 0.66 mg/dL (ref 0.44–1.00)
GFR calc Af Amer: >60 mL/min (ref >60)
GFR calc non Af Amer: >60 mL/min (ref >60)
Glucose, Bld: 108 mg/dL — ABNORMAL HIGH (ref 70–99)
Potassium: 3.9 mmol/L (ref 3.5–5.1)
Sodium: 137 mmol/L (ref 135–145)
Total Bilirubin: 0.3 mg/dL (ref 0.3–1.2)
Total Protein: 7.1 g/dL (ref 6.5–8.1)

## 2019-06-05 LAB — CBC WITH DIFFERENTIAL/PLATELET
Abs Immature Granulocytes: 0.35 10*3/uL — ABNORMAL HIGH (ref 0.00–0.07)
Basophils Absolute: 0.1 10*3/uL (ref 0.0–0.1)
Basophils Relative: 0 %
Eosinophils Absolute: 0.1 10*3/uL (ref 0.0–0.5)
Eosinophils Relative: 0 %
HCT: 28.9 % — ABNORMAL LOW (ref 36.0–46.0)
Hemoglobin: 9 g/dL — ABNORMAL LOW (ref 12.0–15.0)
Immature Granulocytes: 3 %
Lymphocytes Relative: 8 %
Lymphs Abs: 1 10*3/uL (ref 0.7–4.0)
MCH: 30.5 pg (ref 26.0–34.0)
MCHC: 31.1 g/dL (ref 30.0–36.0)
MCV: 98 fL (ref 80.0–100.0)
Monocytes Absolute: 0.7 10*3/uL (ref 0.1–1.0)
Monocytes Relative: 5 %
Neutro Abs: 11.5 10*3/uL — ABNORMAL HIGH (ref 1.7–7.7)
Neutrophils Relative %: 84 %
Platelets: 218 10*3/uL (ref 150–400)
RBC: 2.95 MIL/uL — ABNORMAL LOW (ref 3.87–5.11)
RDW: 18.5 % — ABNORMAL HIGH (ref 11.5–15.5)
WBC: 13.6 10*3/uL — ABNORMAL HIGH (ref 4.0–10.5)
nRBC: 0.1 % (ref 0.0–0.2)

## 2019-06-05 MED ORDER — IOHEXOL 300 MG/ML  SOLN
100.0000 mL | Freq: Once | INTRAMUSCULAR | Status: AC | PRN
  Administered 2019-06-05: 10:00:00 100 mL via INTRAVENOUS

## 2019-06-05 NOTE — Progress Notes (Unsigned)
No potassium replacement needed today. Potassium was 3.9. Dr. Janese Banks is aware.

## 2019-06-10 ENCOUNTER — Ambulatory Visit (INDEPENDENT_AMBULATORY_CARE_PROVIDER_SITE_OTHER): Payer: Medicare Other | Admitting: Surgery

## 2019-06-10 ENCOUNTER — Other Ambulatory Visit: Payer: Self-pay

## 2019-06-10 ENCOUNTER — Encounter: Payer: Self-pay | Admitting: Surgery

## 2019-06-10 VITALS — BP 180/88 | HR 100 | Temp 97.3°F | Ht 64.0 in | Wt 154.0 lb

## 2019-06-10 DIAGNOSIS — K6289 Other specified diseases of anus and rectum: Secondary | ICD-10-CM | POA: Diagnosis not present

## 2019-06-10 DIAGNOSIS — C187 Malignant neoplasm of sigmoid colon: Secondary | ICD-10-CM | POA: Diagnosis not present

## 2019-06-10 DIAGNOSIS — G894 Chronic pain syndrome: Secondary | ICD-10-CM

## 2019-06-10 DIAGNOSIS — C3490 Malignant neoplasm of unspecified part of unspecified bronchus or lung: Secondary | ICD-10-CM

## 2019-06-10 NOTE — Patient Instructions (Signed)
Ref to gi and pain med. We will call you about surgery

## 2019-06-11 ENCOUNTER — Ambulatory Visit: Payer: Medicare Other

## 2019-06-11 ENCOUNTER — Inpatient Hospital Stay: Payer: Medicare Other

## 2019-06-11 ENCOUNTER — Other Ambulatory Visit: Payer: Self-pay

## 2019-06-11 ENCOUNTER — Inpatient Hospital Stay (HOSPITAL_BASED_OUTPATIENT_CLINIC_OR_DEPARTMENT_OTHER): Payer: Medicare Other | Admitting: Oncology

## 2019-06-11 VITALS — BP 181/76 | HR 87 | Temp 97.0°F | Ht 60.5 in | Wt 154.7 lb

## 2019-06-11 DIAGNOSIS — M4816 Ankylosing hyperostosis [Forestier], lumbar region: Secondary | ICD-10-CM | POA: Diagnosis not present

## 2019-06-11 DIAGNOSIS — M4812 Ankylosing hyperostosis [Forestier], cervical region: Secondary | ICD-10-CM | POA: Diagnosis not present

## 2019-06-11 DIAGNOSIS — E876 Hypokalemia: Secondary | ICD-10-CM

## 2019-06-11 DIAGNOSIS — C3490 Malignant neoplasm of unspecified part of unspecified bronchus or lung: Secondary | ICD-10-CM

## 2019-06-11 DIAGNOSIS — C19 Malignant neoplasm of rectosigmoid junction: Secondary | ICD-10-CM | POA: Diagnosis not present

## 2019-06-11 DIAGNOSIS — Z87891 Personal history of nicotine dependence: Secondary | ICD-10-CM | POA: Diagnosis not present

## 2019-06-11 DIAGNOSIS — C187 Malignant neoplasm of sigmoid colon: Secondary | ICD-10-CM

## 2019-06-11 DIAGNOSIS — R5383 Other fatigue: Secondary | ICD-10-CM | POA: Diagnosis not present

## 2019-06-11 DIAGNOSIS — I10 Essential (primary) hypertension: Secondary | ICD-10-CM | POA: Diagnosis not present

## 2019-06-11 DIAGNOSIS — G8929 Other chronic pain: Secondary | ICD-10-CM | POA: Diagnosis not present

## 2019-06-11 DIAGNOSIS — M1990 Unspecified osteoarthritis, unspecified site: Secondary | ICD-10-CM | POA: Diagnosis not present

## 2019-06-11 DIAGNOSIS — M5031 Other cervical disc degeneration,  high cervical region: Secondary | ICD-10-CM | POA: Diagnosis not present

## 2019-06-11 DIAGNOSIS — C3411 Malignant neoplasm of upper lobe, right bronchus or lung: Secondary | ICD-10-CM | POA: Diagnosis not present

## 2019-06-11 DIAGNOSIS — R0600 Dyspnea, unspecified: Secondary | ICD-10-CM | POA: Diagnosis not present

## 2019-06-11 DIAGNOSIS — M549 Dorsalgia, unspecified: Secondary | ICD-10-CM | POA: Diagnosis not present

## 2019-06-11 DIAGNOSIS — K219 Gastro-esophageal reflux disease without esophagitis: Secondary | ICD-10-CM | POA: Diagnosis not present

## 2019-06-11 DIAGNOSIS — Z79899 Other long term (current) drug therapy: Secondary | ICD-10-CM | POA: Diagnosis not present

## 2019-06-11 DIAGNOSIS — Z87440 Personal history of urinary (tract) infections: Secondary | ICD-10-CM | POA: Diagnosis not present

## 2019-06-11 DIAGNOSIS — Z7951 Long term (current) use of inhaled steroids: Secondary | ICD-10-CM | POA: Diagnosis not present

## 2019-06-11 DIAGNOSIS — J45909 Unspecified asthma, uncomplicated: Secondary | ICD-10-CM | POA: Diagnosis not present

## 2019-06-11 DIAGNOSIS — Z9221 Personal history of antineoplastic chemotherapy: Secondary | ICD-10-CM | POA: Diagnosis not present

## 2019-06-11 DIAGNOSIS — Z9049 Acquired absence of other specified parts of digestive tract: Secondary | ICD-10-CM | POA: Diagnosis not present

## 2019-06-11 LAB — CBC WITH DIFFERENTIAL/PLATELET
Abs Immature Granulocytes: 0.02 10*3/uL (ref 0.00–0.07)
Basophils Absolute: 0.1 10*3/uL (ref 0.0–0.1)
Basophils Relative: 1 %
Eosinophils Absolute: 0 10*3/uL (ref 0.0–0.5)
Eosinophils Relative: 1 %
HCT: 29.9 % — ABNORMAL LOW (ref 36.0–46.0)
Hemoglobin: 9.7 g/dL — ABNORMAL LOW (ref 12.0–15.0)
Immature Granulocytes: 0 %
Lymphocytes Relative: 18 %
Lymphs Abs: 1 10*3/uL (ref 0.7–4.0)
MCH: 31.3 pg (ref 26.0–34.0)
MCHC: 32.4 g/dL (ref 30.0–36.0)
MCV: 96.5 fL (ref 80.0–100.0)
Monocytes Absolute: 0.6 10*3/uL (ref 0.1–1.0)
Monocytes Relative: 11 %
Neutro Abs: 3.7 10*3/uL (ref 1.7–7.7)
Neutrophils Relative %: 69 %
Platelets: 260 10*3/uL (ref 150–400)
RBC: 3.1 MIL/uL — ABNORMAL LOW (ref 3.87–5.11)
RDW: 17.1 % — ABNORMAL HIGH (ref 11.5–15.5)
WBC: 5.4 10*3/uL (ref 4.0–10.5)
nRBC: 0 % (ref 0.0–0.2)

## 2019-06-11 LAB — COMPREHENSIVE METABOLIC PANEL
ALT: 17 U/L (ref 0–44)
AST: 24 U/L (ref 15–41)
Albumin: 4.2 g/dL (ref 3.5–5.0)
Alkaline Phosphatase: 102 U/L (ref 38–126)
Anion gap: 12 (ref 5–15)
BUN: 12 mg/dL (ref 8–23)
CO2: 24 mmol/L (ref 22–32)
Calcium: 9 mg/dL (ref 8.9–10.3)
Chloride: 102 mmol/L (ref 98–111)
Creatinine, Ser: 0.75 mg/dL (ref 0.44–1.00)
GFR calc Af Amer: >60 mL/min (ref >60)
GFR calc non Af Amer: >60 mL/min (ref >60)
Glucose, Bld: 180 mg/dL — ABNORMAL HIGH (ref 70–99)
Potassium: 3.1 mmol/L — ABNORMAL LOW (ref 3.5–5.1)
Sodium: 138 mmol/L (ref 135–145)
Total Bilirubin: 0.4 mg/dL (ref 0.3–1.2)
Total Protein: 6.9 g/dL (ref 6.5–8.1)

## 2019-06-11 MED ORDER — SODIUM CHLORIDE 0.9 % IV SOLN: Freq: Once | INTRAVENOUS | Status: DC

## 2019-06-11 MED ORDER — HEPARIN SOD (PORK) LOCK FLUSH 100 UNIT/ML IV SOLN
500.0000 [IU] | Freq: Once | INTRAVENOUS | Status: AC
  Administered 2019-06-11: 500 [IU] via INTRAVENOUS

## 2019-06-11 MED ORDER — SODIUM CHLORIDE 0.9% FLUSH
10.0000 mL | Freq: Once | INTRAVENOUS | Status: AC
  Administered 2019-06-11: 10 mL via INTRAVENOUS
  Filled 2019-06-11: qty 10

## 2019-06-11 MED ORDER — POTASSIUM CHLORIDE IN NACL 20-0.9 MEQ/L-% IV SOLN
INTRAVENOUS | Status: DC
  Administered 2019-06-11: 11:00:00 via INTRAVENOUS
  Filled 2019-06-11 (×2): qty 1000

## 2019-06-11 MED ORDER — POTASSIUM CHLORIDE CRYS ER 20 MEQ PO TBCR: 20.0000 meq | EXTENDED_RELEASE_TABLET | Freq: Every day | ORAL | 0 refills | Status: DC

## 2019-06-12 ENCOUNTER — Encounter: Payer: Self-pay | Admitting: Oncology

## 2019-06-12 DIAGNOSIS — M19011 Primary osteoarthritis, right shoulder: Secondary | ICD-10-CM | POA: Diagnosis not present

## 2019-06-12 DIAGNOSIS — M4810 Ankylosing hyperostosis [Forestier], site unspecified: Secondary | ICD-10-CM | POA: Diagnosis not present

## 2019-06-12 DIAGNOSIS — M5136 Other intervertebral disc degeneration, lumbar region: Secondary | ICD-10-CM | POA: Diagnosis not present

## 2019-06-12 DIAGNOSIS — Z85038 Personal history of other malignant neoplasm of large intestine: Secondary | ICD-10-CM | POA: Diagnosis not present

## 2019-06-12 DIAGNOSIS — Z433 Encounter for attention to colostomy: Secondary | ICD-10-CM | POA: Diagnosis not present

## 2019-06-12 DIAGNOSIS — K219 Gastro-esophageal reflux disease without esophagitis: Secondary | ICD-10-CM | POA: Diagnosis not present

## 2019-06-12 DIAGNOSIS — I10 Essential (primary) hypertension: Secondary | ICD-10-CM | POA: Diagnosis not present

## 2019-06-12 DIAGNOSIS — Z87440 Personal history of urinary (tract) infections: Secondary | ICD-10-CM | POA: Diagnosis not present

## 2019-06-12 DIAGNOSIS — G8929 Other chronic pain: Secondary | ICD-10-CM | POA: Diagnosis not present

## 2019-06-12 DIAGNOSIS — J45909 Unspecified asthma, uncomplicated: Secondary | ICD-10-CM | POA: Diagnosis not present

## 2019-06-12 DIAGNOSIS — M4802 Spinal stenosis, cervical region: Secondary | ICD-10-CM | POA: Diagnosis not present

## 2019-06-12 DIAGNOSIS — M533 Sacrococcygeal disorders, not elsewhere classified: Secondary | ICD-10-CM | POA: Diagnosis not present

## 2019-06-12 DIAGNOSIS — C3400 Malignant neoplasm of unspecified main bronchus: Secondary | ICD-10-CM | POA: Diagnosis not present

## 2019-06-12 DIAGNOSIS — M4316 Spondylolisthesis, lumbar region: Secondary | ICD-10-CM | POA: Diagnosis not present

## 2019-06-12 DIAGNOSIS — M5030 Other cervical disc degeneration, unspecified cervical region: Secondary | ICD-10-CM | POA: Diagnosis not present

## 2019-06-12 NOTE — Progress Notes (Signed)
Hematology/Oncology Consult note [facility]  [phone] [phone]  Patient Care Team: Inc, [facility] as PCP - General Telford Nab, [place] as Registered Nurse   Name of the patient: Gloria Allen  [id number]  [birth year]   Date of visit: 06/12/19  Diagnosis- 1.Stage III colon cancer status post surgery 2.Limited stage small cell lung cancer  Chief complaint/ Reason for visit-discuss CT scan results and further management  Heme/Onc history: patient is a 73 year old African-American female who recently underwent screening colonoscopy by Dr. Bonna Gains.Colonoscopy showed villous nonobstructing large mass in the sigmoid colon 12 cm proximal to the anus. Mass was partially circumferential. Biopsy showed invasive adenocarcinoma. Patient was seen by Dr. Gretchen Short underwent hemicolectomy with lymph node sampling on 11/27/2018. Final pathology showed invasive colorectal carcinoma 3.5 cm, grade 2. Tumor invades through muscularis propria into the peri-colorectal tissue. Margins negative. Tumor deposits present, 2. 1 out of 9 lymph nodes examined was positive for malignancy. [id number].Patient was feeling poorly after surgery and she had no social support and decided against adjuvant chemotherapy.  Patient underwent CT chest to complete her staging work-up which showed 2 lung nodules one in the right upper lobe and one in the left left upper lobe.This was followed by a PET CT scan which showed with the left upper lobe nodule was not hypermetabolic.The right upper lobe lung nodule measure 1.6 cm with an SUV of 6.8. No hypermetabolic axillary mediastinal or hilar lymphadenopathy or evidence of metastatic disease elsewhere. Patient underwent CT-guided biopsy of the right upper lobe lung nodule which was consistent with small cell lung cancerpositive for CD56 and TTF-1.Patient started concurrent chemoradiation in June 2020.  Carboplatin chosen instead of cisplatin with etoposide given her age, poor social support and concern for renal dysfunction. Patient completed 4 cycles of carboplatin and etoposide on 05/19/2019  Interval history-she still feels fatigued but is showing slow signs of improvement.  She has chronic back pain.  Appetite is fair.  ECOG PS- 2 Pain scale- 4 Opioid associated constipation- no  Review of systems- Review of Systems  Constitutional: Positive for malaise/fatigue. Negative for chills, fever and weight loss.  HENT: Negative for congestion, ear discharge and nosebleeds.   Eyes: Negative for blurred vision.  Respiratory: Negative for cough, hemoptysis, sputum production, shortness of breath and wheezing.   Cardiovascular: Negative for chest pain, palpitations, orthopnea and claudication.  Gastrointestinal: Negative for abdominal pain, blood in stool, constipation, diarrhea, heartburn, melena, nausea and vomiting.  Genitourinary: Negative for dysuria, flank pain, frequency, hematuria and urgency.  Musculoskeletal: Positive for back pain. Negative for joint pain and myalgias.  Skin: Negative for rash.  Neurological: Negative for dizziness, tingling, focal weakness, seizures, weakness and headaches.  Endo/Heme/Allergies: Does not bruise/bleed easily.  Psychiatric/Behavioral: Negative for depression and suicidal ideas. The patient does not have insomnia.       Allergies  Allergen Reactions  . Ace Inhibitors Swelling  . Angiotensin Receptor Blockers Swelling  . Sulfa Antibiotics Itching  . Chlorthalidone Rash  . Flexeril [Cyclobenzaprine] Rash     Past Medical History:  Diagnosis Date  . Anxiety   . Arthritis   . Asthma   . Cervical central spinal stenosis (C3-C7) (worse at C4-5) 07/30/2017  . Cervical foraminal stenosis (C4-5 and C5-6) (Bilateral) (L>R) 07/30/2017  . Chronic lower extremity pain (Fourth Area of Pain) (Bilateral) (R>L) 06/11/2017  . Chronic neck pain (Primary  Area of Pain) (Bilateral) (R>L) 06/11/2017  . Chronic sacroiliac joint pain (Bilateral) (R>L) 06/11/2017  .  Chronic shoulder pain [facility] Area of Pain) (Right) 06/11/2017  . Chronic upper extremity pain (Fifth Area of Pain) (Bilateral) (R>L) 06/11/2017  . Colon cancer ([place]) 11/2018   Partial colon resection  . DDD (degenerative disc disease), cervical 07/30/2017  . DDD (degenerative disc disease), lumbar 07/30/2017  . Depression   . DISH (diffuse idiopathic skeletal hyperostosis) 07/30/2017  . Dyspnea    with exertion  . Entrapment syndrome 06/20/2017   2002 on R and 2010 on L  . Full thickness rotator cuff tear 06/12/2017  . GERD (gastroesophageal reflux disease)   . Grade 1 Anterolisthesis of L4 over L5 07/30/2017  . Headache   . Hx: UTI (urinary tract infection)   . Hypertension   . Inflammation of joint of shoulder region 01/15/2017  . Lumbar central spinal stenosis (L4-5) 07/30/2017  . Lumbar disc protrusion (Left: L5-S1) (Right: L1-2) 07/30/2017   L5-S1 left foraminal protrusion with L5 impingement. L1-2 right paracentral protrusion without impingement.  . Lumbar facet arthropathy (Bilateral) 07/30/2017  . Lumbar facet syndrome (Bilateral) (R>L) 07/30/2017  . Lung cancer ([place]) 11/2018   Chemo  and rad tx's  . Osteoarthritis of shoulder (Right) 01/15/2017     Past Surgical History:  Procedure Laterality Date  . ABDOMINAL HYSTERECTOMY    . APPENDECTOMY    . BREAST REDUCTION SURGERY Bilateral   . COLONOSCOPY WITH PROPOFOL N/A 11/05/2018   Procedure: COLONOSCOPY WITH PROPOFOL;  Surgeon: Virgel Manifold, MD;  Location: [facility] ENDOSCOPY;  Service: Endoscopy;  Laterality: N/A;  . COLONOSCOPY WITH PROPOFOL N/A 11/06/2018   Procedure: COLONOSCOPY WITH PROPOFOL;  Surgeon: Virgel Manifold, MD;  Location: [facility] ENDOSCOPY;  Service: Endoscopy;  Laterality: N/A;  . COLOSTOMY    . DILATION AND CURETTAGE OF UTERUS    . ESOPHAGOGASTRODUODENOSCOPY (EGD) WITH PROPOFOL N/A 11/05/2018    Procedure: ESOPHAGOGASTRODUODENOSCOPY (EGD) WITH PROPOFOL;  Surgeon: Virgel Manifold, MD;  Location: [facility] ENDOSCOPY;  Service: Endoscopy;  Laterality: N/A;  . ESOPHAGOGASTRODUODENOSCOPY (EGD) WITH PROPOFOL N/A 03/27/2019   Procedure: ESOPHAGOGASTRODUODENOSCOPY (EGD) WITH PROPOFOL;  Surgeon: Lucilla Lame, MD;  Location: [facility] ENDOSCOPY;  Service: Endoscopy;  Laterality: N/A;  . ILEOSTOMY Right 11/27/2018   Procedure: ILEOSTOMY;  Surgeon: Jules Husbands, MD;  Location: [facility] ORS;  Service: General;  Laterality: Right;  . LAPAROSCOPIC SIGMOID COLECTOMY N/A 11/27/2018   Procedure: LAPAROSCOPIC SIGMOID COLECTOMY;  Surgeon: Jules Husbands, MD;  Location: [facility] ORS;  Service: General;  Laterality: N/A;  . ORIF ANKLE FRACTURE Right 11/21/2018   Procedure: OPEN REDUCTION INTERNAL FIXATION (ORIF) ANKLE FRACTURE;  Surgeon: Corky Mull, MD;  Location: [facility] ORS;  Service: Orthopedics;  Laterality: Right;  . PORTACATH PLACEMENT N/A 03/12/2019   Procedure: INSERTION PORT-A-CATH;  Surgeon: Jules Husbands, MD;  Location: [facility] ORS;  Service: General;  Laterality: N/A;  . REDUCTION MAMMAPLASTY    . SHOULDER ARTHROSCOPY WITH OPEN ROTATOR CUFF REPAIR AND DISTAL CLAVICLE ACROMINECTOMY Right 03/26/2017   Procedure: right shoulder arthroscopy, arthroscopic subacromial decompression, distal clavicle excision, mini open rotator cuff repair;  Surgeon: Thornton Park, MD;  Location: [facility] ORS;  Service: Orthopedics;  Laterality: Right;    Social History   Socioeconomic History  . Marital status: Divorced    Spouse name: Not on file  . Number of children: 3  . Years of education: Not on file  . Highest education level: 11th grade  Occupational History  . Not on file  Social Needs  . Financial resource strain: Somewhat hard  . Food insecurity    Worry:  Sometimes true    Inability: Never true  . Transportation needs    Medical: Yes    Non-medical: No  Tobacco Use  . Smoking status: Former Smoker    Packs/day:  1.00    Types: Cigarettes    Quit date: 2010    Years since quitting: 10.7  . Smokeless tobacco: Never Used  . Tobacco comment: quit over 30 years ago   Substance and Sexual Activity  . Alcohol use: No  . Drug use: Yes    Types: Marijuana  . Sexual activity: Never  Lifestyle  . Physical activity    Days per week: 0 days    Minutes per session: 0 min  . Stress: Not at all  Relationships  . Social Herbalist on phone: Never    Gets together: Never    Attends religious service: More than 4 times per year    Active member of club or organization: No    Attends meetings of clubs or organizations: Never    Relationship status: Divorced  . Intimate partner violence    Fear of current or ex partner: No    Emotionally abused: No    Physically abused: No    Forced sexual activity: No  Other Topics Concern  . 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 She receives disability. She has 3 children and several grandchildren. She lives alone. She drives. Uses a walker and shower chair at baseline.     Family History  Problem Relation Age of Onset  . Hypertension Mother   . Breast cancer Mother 76  . Breast cancer Sister 58     Current Outpatient Medications:  .  albuterol (VENTOLIN HFA) 108 (90 Base) MCG/ACT inhaler, Inhale 2 puffs into the lungs every 6 (six) hours as needed for wheezing or shortness of breath., Disp: 18 g, Rfl: 0 .  amLODipine (NORVASC) 10 MG tablet, Take 1 tablet (10 mg total) by mouth daily., Disp: 30 tablet, Rfl: 0 .  benzonatate (TESSALON) 200 MG capsule, Take 1 capsule (200 mg total) by mouth 3 (three) times daily as needed for cough., Disp: 60 capsule, Rfl: 0 .  BREO ELLIPTA 200-25 MCG/INH AEPB, Inhale 1 puff into the lungs daily., Disp: 60 each, Rfl: 0 .  cloNIDine (CATAPRES) 0.2 MG tablet, Take 1 tablet (0.2 mg total) by mouth 2 (two) times daily., Disp: 60 tablet, Rfl: 0 .  hydrALAZINE (APRESOLINE) 100 MG  tablet, Take 1 tablet (100 mg total) by mouth 3 (three) times daily., Disp: 90 tablet, Rfl: 0 .  hydrOXYzine (ATARAX/VISTARIL) 10 MG tablet, Take 1 tablet (10 mg total) by mouth 3 (three) times daily as needed for itching., Disp: 30 tablet, Rfl: 0 .  loperamide (IMODIUM) 2 MG capsule, Take 2 capsules (4 mg total) by mouth 3 (three) times daily., Disp: 180 capsule, Rfl: 3 .  lovastatin (MEVACOR) 40 MG tablet, Take 1 tablet (40 mg total) by mouth at bedtime., Disp: 30 tablet, Rfl: 0 .  meclizine (ANTIVERT) 12.5 MG tablet, Take 1 tablet (12.5 mg total) by mouth 3 (three) times daily as needed for dizziness., Disp: 30 tablet, Rfl: 0 .  methocarbamol (ROBAXIN) 500 MG tablet, Take 1 tablet (500 mg total) by mouth at bedtime as needed for muscle spasms., Disp: 30 tablet, Rfl: 0 .  metoprolol succinate (TOPROL-XL) 100 MG 24 hr tablet, Take 1 tablet (100 mg total) by mouth daily. Take with or  immediately following a meal., Disp: 30 tablet, Rfl: 0 .  mirtazapine (REMERON) 15 MG tablet, take 1 tablet by mouth at bedtime for APPETITIE, Disp: 30 tablet, Rfl: 0 .  montelukast (SINGULAIR) 10 MG tablet, Take 1 tablet (10 mg total) by mouth at bedtime as needed., Disp: 30 tablet, Rfl: 0 .  OLANZapine (ZYPREXA) 10 MG tablet, Take 1 tablet (10 mg total) by mouth at bedtime., Disp: 30 tablet, Rfl: 0 .  omeprazole (PRILOSEC) 40 MG capsule, Take 1 capsule (40 mg total) by mouth 2 (two) times daily., Disp: 60 capsule, Rfl: 0 .  ondansetron (ZOFRAN) 8 MG tablet, Take 1 tablet (8 mg total) by mouth 2 (two) times daily as needed for refractory nausea / vomiting. Start on day 3 after carboplatin chemo., Disp: 30 tablet, Rfl: 0 .  oxybutynin (DITROPAN) 5 MG tablet, Take 1 tablet (5 mg total) by mouth 2 (two) times daily., Disp: 60 tablet, Rfl: 0 .  potassium chloride SA (K-DUR) 20 MEQ tablet, Take 1 tablet (20 mEq total) by mouth daily., Disp: 20 tablet, Rfl: 0 .  prochlorperazine (COMPAZINE) 10 MG tablet, Take 1 tablet (10 mg  total) by mouth every 6 (six) hours as needed (Nausea or vomiting)., Disp: 30 tablet, Rfl: 1 .  sertraline (ZOLOFT) 100 MG tablet, Take 2 tablets (200 mg total) by mouth at bedtime., Disp: 60 tablet, Rfl: 0 .  terazosin (HYTRIN) 1 MG capsule, Take 1 capsule (1 mg total) by mouth at bedtime., Disp: 30 capsule, Rfl: 0 .  traZODone (DESYREL) 50 MG tablet, Take 0.5-1 tablets (25-50 mg total) by mouth at bedtime as needed for sleep., Disp: 30 tablet, Rfl: 0 .  triamcinolone cream (KENALOG) 0.1 %, Apply 1 application topically 2 (two) times daily as needed (itching)., Disp: 15 g, Rfl: 0 .  clonazePAM (KLONOPIN) 0.5 MG tablet, Take 1 tablet (0.5 mg total) by mouth 3 (three) times daily., Disp: 30 tablet, Rfl: 0 .  HYDROcodone-homatropine (HYCODAN) 5-1.5 MG/5ML syrup, Take 5 mLs by mouth every 6 (six) hours as needed for cough., Disp: 120 mL, Rfl: 0 .  tiZANidine (ZANAFLEX) 2 MG tablet, Take 1 tablet (2 mg total) by mouth every 6 (six) hours as needed for muscle spasms., Disp: 30 tablet, Rfl: 0 No current facility-administered medications for this visit.   Facility-Administered Medications Ordered in Other Visits:  .  sodium chloride flush (NS) 0.9 % injection 10 mL, 10 mL, Intravenous, PRN, Verlon Au, NP, 10 mL at 04/13/19 1430  Physical exam:  Vitals:   06/11/19 0922  BP: (!) 181/76  Pulse: 87  Temp: (!) 97 F (36.1 C)  TempSrc: Tympanic  Weight: 154 lb 11.2 oz (70.2 kg)  Height: 5' 0.5" (1.537 m)   Physical Exam Constitutional:      Comments: Sitting in a wheelchair.  Appears mildly fatigued  HENT:     Head: Normocephalic and atraumatic.  Eyes:     Pupils: Pupils are equal, round, and reactive to light.  Neck:     Musculoskeletal: Normal range of motion.  Cardiovascular:     Rate and Rhythm: Normal rate and regular rhythm.     Heart sounds: Normal heart sounds.  Pulmonary:     Effort: Pulmonary effort is normal.     Breath sounds: Normal breath sounds.  Abdominal:      General: Bowel sounds are normal.     Palpations: Abdomen is soft.     Comments: Colostomy in place  Skin:    General: Skin is  warm and dry.  Neurological:     Mental Status: She is alert and oriented to person, place, and time.      CMP Latest Ref Rng & Units 06/11/2019  Glucose 70 - 99 mg/dL 180(H)  BUN 8 - 23 mg/dL 12  Creatinine 0.44 - 1.00 mg/dL 0.75  Sodium 135 - 145 mmol/L 138  Potassium 3.5 - 5.1 mmol/L 3.1(L)  Chloride 98 - 111 mmol/L 102  CO2 22 - 32 mmol/L 24  Calcium 8.9 - 10.3 mg/dL 9.0  Total Protein 6.5 - 8.1 g/dL 6.9  Total Bilirubin 0.3 - 1.2 mg/dL 0.4  Alkaline Phos 38 - 126 U/L 102  AST 15 - 41 U/L 24  ALT 0 - 44 U/L 17   CBC Latest Ref Rng & Units 06/11/2019  WBC 4.0 - 10.5 K/uL 5.4  Hemoglobin 12.0 - 15.0 g/dL 9.7(L)  Hematocrit 36.0 - 46.0 % 29.9(L)  Platelets 150 - 400 K/uL 260    No images are attached to the encounter.  Ct Chest W Contrast  Result Date: 06/05/2019 CLINICAL DATA:  History of colon cancer. Status post partial colectomy in February of 2020. Staging exam is at the time of the colon cancer workup revealed a right upper lobe pulmonary nodule that was biopsy proven small-cell lung cancer. Restaging. EXAM: CT CHEST, ABDOMEN, AND PELVIS WITH CONTRAST TECHNIQUE: Multidetector CT imaging of the chest, abdomen and pelvis was performed following the standard protocol during bolus administration of intravenous contrast. CONTRAST:  183mL OMNIPAQUE IOHEXOL 300 MG/ML  SOLN COMPARISON:  03/24/2019 FINDINGS: CT CHEST FINDINGS Cardiovascular: The heart size is normal. No substantial pericardial effusion. Atherosclerotic calcification is noted in the wall of the thoracic aorta. Right Port-A-Cath tip is in the distal SVC near the RA junction. Mediastinum/Nodes: No mediastinal lymphadenopathy. There is no hilar lymphadenopathy. The esophagus has normal imaging features. There is no axillary lymphadenopathy. Tiny right thyroid nodule is stable . Lungs/Pleura:  Centrilobular emphsyema noted. The medial right lung nodule has decreased further in the interval, measuring 3 mm on 47/3 today compared to 7.5 mm previously. The sub solid nodular lesion in the peripheral left upper lobe measures 1.8 cm in long axis today, stable. This lesion appears slightly more confluent today but PET-CT of 02/05/2019 revealed no hypermetabolism within this lesion. No new suspicious pulmonary nodule or mass. No focal airspace consolidation. No pleural effusion. Musculoskeletal: No worrisome lytic or sclerotic osseous abnormality. CT ABDOMEN PELVIS FINDINGS Hepatobiliary: The 9 mm low-density lesion in the inferior tip of the right liver (76/2) is stable. Gallbladder is surgically absent. No intrahepatic or extrahepatic biliary dilation. Pancreas: No focal mass lesion. No dilatation of the main duct. No intraparenchymal cyst. No peripancreatic edema. Spleen: No splenomegaly. No focal mass lesion. Adrenals/Urinary Tract: No adrenal nodule or mass. Duplicated intrarenal collecting system noted bilaterally. Tiny hypodensity in the upper pole left kidney is too small to characterize but likely a cyst. No evidence for hydroureter. The urinary bladder appears normal for the degree of distention. Stomach/Bowel: Stomach is unremarkable. No gastric wall thickening. No evidence of outlet obstruction. Duodenum is normally positioned as is the ligament of Treitz. No small bowel wall thickening. No small bowel dilatation. Right lower quadrant loop ileostomy noted. Patient is status post hand assisted laparoscopic low anterior resection. Vascular/Lymphatic: There is abdominal aortic atherosclerosis without aneurysm. There is no gastrohepatic or hepatoduodenal ligament lymphadenopathy. No intraperitoneal or retroperitoneal lymphadenopathy. Soft tissue attenuation along the aortic bifurcation and left common iliac artery is stable. No pelvic sidewall  lymphadenopathy. Reproductive: The uterus is surgically  absent. There is no adnexal mass. Other: No intraperitoneal free fluid. Musculoskeletal: No worrisome lytic or sclerotic osseous abnormality. IMPRESSION: 1. Continued further decrease in size of the posterior right upper lobe pulmonary nodule now measuring 3 mm. 2. 1.8 cm peripheral sub solid nodule in the left upper lobe appears slightly more confluent on today's study but is very similar in appearance to the exam from 01/21/2019. Although this lesion did not show hypermetabolism on PET-CT of 02/05/2019, continued attention on follow-up recommended. 3. No evidence for local recurrence in the pelvis. No metastatic disease in the chest, abdomen, or pelvis. 4.  Aortic Atherosclerois (ICD10-170.0) Electronically Signed   By: Misty Stanley M.D.   On: 06/05/2019 13:04   Ct Abdomen Pelvis W Contrast  Result Date: 06/05/2019 CLINICAL DATA:  History of colon cancer. Status post partial colectomy in February of 2020. Staging exam is at the time of the colon cancer workup revealed a right upper lobe pulmonary nodule that was biopsy proven small-cell lung cancer. Restaging. EXAM: CT CHEST, ABDOMEN, AND PELVIS WITH CONTRAST TECHNIQUE: Multidetector CT imaging of the chest, abdomen and pelvis was performed following the standard protocol during bolus administration of intravenous contrast. CONTRAST:  162mL OMNIPAQUE IOHEXOL 300 MG/ML  SOLN COMPARISON:  03/24/2019 FINDINGS: CT CHEST FINDINGS Cardiovascular: The heart size is normal. No substantial pericardial effusion. Atherosclerotic calcification is noted in the wall of the thoracic aorta. Right Port-A-Cath tip is in the distal SVC near the RA junction. Mediastinum/Nodes: No mediastinal lymphadenopathy. There is no hilar lymphadenopathy. The esophagus has normal imaging features. There is no axillary lymphadenopathy. Tiny right thyroid nodule is stable . Lungs/Pleura: Centrilobular emphsyema noted. The medial right lung nodule has decreased further in the interval, measuring  3 mm on 47/3 today compared to 7.5 mm previously. The sub solid nodular lesion in the peripheral left upper lobe measures 1.8 cm in long axis today, stable. This lesion appears slightly more confluent today but PET-CT of 02/05/2019 revealed no hypermetabolism within this lesion. No new suspicious pulmonary nodule or mass. No focal airspace consolidation. No pleural effusion. Musculoskeletal: No worrisome lytic or sclerotic osseous abnormality. CT ABDOMEN PELVIS FINDINGS Hepatobiliary: The 9 mm low-density lesion in the inferior tip of the right liver (76/2) is stable. Gallbladder is surgically absent. No intrahepatic or extrahepatic biliary dilation. Pancreas: No focal mass lesion. No dilatation of the main duct. No intraparenchymal cyst. No peripancreatic edema. Spleen: No splenomegaly. No focal mass lesion. Adrenals/Urinary Tract: No adrenal nodule or mass. Duplicated intrarenal collecting system noted bilaterally. Tiny hypodensity in the upper pole left kidney is too small to characterize but likely a cyst. No evidence for hydroureter. The urinary bladder appears normal for the degree of distention. Stomach/Bowel: Stomach is unremarkable. No gastric wall thickening. No evidence of outlet obstruction. Duodenum is normally positioned as is the ligament of Treitz. No small bowel wall thickening. No small bowel dilatation. Right lower quadrant loop ileostomy noted. Patient is status post hand assisted laparoscopic low anterior resection. Vascular/Lymphatic: There is abdominal aortic atherosclerosis without aneurysm. There is no gastrohepatic or hepatoduodenal ligament lymphadenopathy. No intraperitoneal or retroperitoneal lymphadenopathy. Soft tissue attenuation along the aortic bifurcation and left common iliac artery is stable. No pelvic sidewall lymphadenopathy. Reproductive: The uterus is surgically absent. There is no adnexal mass. Other: No intraperitoneal free fluid. Musculoskeletal: No worrisome lytic or  sclerotic osseous abnormality. IMPRESSION: 1. Continued further decrease in size of the posterior right upper lobe pulmonary nodule now measuring 3  mm. 2. 1.8 cm peripheral sub solid nodule in the left upper lobe appears slightly more confluent on today's study but is very similar in appearance to the exam from 01/21/2019. Although this lesion did not show hypermetabolism on PET-CT of 02/05/2019, continued attention on follow-up recommended. 3. No evidence for local recurrence in the pelvis. No metastatic disease in the chest, abdomen, or pelvis. 4.  Aortic Atherosclerois (ICD10-170.0) Electronically Signed   By: Misty Stanley M.D.   On: 06/05/2019 13:04     Assessment and plan- Patient is a 73 y.o. female with h/o stage III colon cancer s/p rescetion and now with limited stage small cell lung cancer.  Status post 4 cycles of carboplatin and etoposide and here to discuss the results of CT scan and further management  1.  Small cell lung cancer: I have reviewed CT chest abdomen pelvis images independently and discussed findings with the patient. Right upper lobe lung nodule has decreased to 3 mm.  She also has a left upper lobe 1.8 cm lung nodule but this was not hypermetabolic on the prior PET scan.  No other evidence of distant metastatic disease.  She has had good response to concurrent chemoradiation.  Plan is to continue active surveillance with CT scans every 3 months.  I did discuss the role of prophylactic cranial irradiation and small cell lung cancer limited stage.  Patient however does not wish to pursue PCI at this time.  I will consider active MRI surveillance for her  2.  Stage III colon cancer: She is s/p surgery but at that time did not wish to pursue adjuvant chemotherapy.  She will continue to get surveillance imaging.  She remains in remission.  She will be seeing Dr. Adora Fridge soon for her colostomy takedown as well as port removal.  3.  Patient is still fatigued from her chemotherapy and  will likely recover over the next several weeks.  She has been requiring periodic IV fluids and will receive 1 L of IV fluids today along with IV potassium.  We will also renew her outpatient oral potassium.  She will return to clinic in 2 weeks for possible IV fluids and/or possible potassium and I will see her back in 1 month to assess her functional status post chemo   Visit Diagnosis 1. Malignant neoplasm of sigmoid colon ([place])   2. Small cell lung cancer ([facility])      Dr. Randa Evens, MD, MPH [facility] at [facility] [phone] 06/12/2019 8:59 AM

## 2019-06-13 ENCOUNTER — Encounter: Payer: Self-pay | Admitting: Surgery

## 2019-06-13 NOTE — Progress Notes (Signed)
Outpatient Surgical Follow Up  06/13/2019  THREASA Allen is an 73 y.o. female.   Chief Complaint  Patient presents with  . 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  She also had a new primary in the lung and is status post chemotherapy.  Recent CT scan personally reviewed showing evidence of no metastatic disease or recurrent disease within the pelvis.  There is also improvement in the lung lesion as well.  There is evidence of a small parastomal hernia.  No evidence of obstruction or other acute abnormalities.  She complains of lower abdominal and rectal pain that is intermittent.  But her ileostomy is working well.  No fevers no chills and no other symptoms.  Past Medical History:  Diagnosis Date  . Anxiety   . Arthritis   . Asthma   . Cervical central spinal stenosis (C3-C7) (worse at C4-5) 07/30/2017  . Cervical foraminal stenosis (C4-5 and C5-6) (Bilateral) (L>R) 07/30/2017  . Chronic lower extremity pain (Fourth Area of Pain) (Bilateral) (R>L) 06/11/2017  . Chronic neck pain (Primary Area of Pain) (Bilateral) (R>L) 06/11/2017  . Chronic sacroiliac joint pain (Bilateral) (R>L) 06/11/2017  . Chronic shoulder pain [facility] Area of Pain) (Right) 06/11/2017  . Chronic upper extremity pain (Fifth Area of Pain) (Bilateral) (R>L) 06/11/2017  . Colon cancer ([place]) 11/2018   Partial colon resection  . DDD (degenerative disc disease), cervical 07/30/2017  . DDD (degenerative disc disease), lumbar 07/30/2017  . Depression   . DISH (diffuse idiopathic skeletal hyperostosis) 07/30/2017  . Dyspnea    with exertion  . Entrapment syndrome 06/20/2017   2002 on R and 2010 on L  . Full thickness rotator cuff tear 06/12/2017  . GERD (gastroesophageal reflux disease)   . Grade 1 Anterolisthesis of L4 over L5 07/30/2017  . Headache   . Hx: UTI (urinary tract infection)    . Hypertension   . Inflammation of joint of shoulder region 01/15/2017  . Lumbar central spinal stenosis (L4-5) 07/30/2017  . Lumbar disc protrusion (Left: L5-S1) (Right: L1-2) 07/30/2017   L5-S1 left foraminal protrusion with L5 impingement. L1-2 right paracentral protrusion without impingement.  . Lumbar facet arthropathy (Bilateral) 07/30/2017  . Lumbar facet syndrome (Bilateral) (R>L) 07/30/2017  . Lung cancer ([place]) 11/2018   Chemo  and rad tx's  . Osteoarthritis of shoulder (Right) 01/15/2017    Past Surgical History:  Procedure Laterality Date  . ABDOMINAL HYSTERECTOMY    . APPENDECTOMY    . BREAST REDUCTION SURGERY Bilateral   . COLONOSCOPY WITH PROPOFOL N/A 11/05/2018   Procedure: COLONOSCOPY WITH PROPOFOL;  Surgeon: Virgel Manifold, MD;  Location: [facility] ENDOSCOPY;  Service: Endoscopy;  Laterality: N/A;  . COLONOSCOPY WITH PROPOFOL N/A 11/06/2018   Procedure: COLONOSCOPY WITH PROPOFOL;  Surgeon: Virgel Manifold, MD;  Location: [facility] ENDOSCOPY;  Service: Endoscopy;  Laterality: N/A;  . COLOSTOMY    . DILATION AND CURETTAGE OF UTERUS    . ESOPHAGOGASTRODUODENOSCOPY (EGD) WITH PROPOFOL N/A 11/05/2018   Procedure: ESOPHAGOGASTRODUODENOSCOPY (EGD) WITH PROPOFOL;  Surgeon: Virgel Manifold, MD;  Location: [facility] ENDOSCOPY;  Service: Endoscopy;  Laterality: N/A;  . ESOPHAGOGASTRODUODENOSCOPY (EGD) WITH PROPOFOL N/A 03/27/2019   Procedure: ESOPHAGOGASTRODUODENOSCOPY (EGD) WITH PROPOFOL;  Surgeon: Lucilla Lame, MD;  Location: [facility] ENDOSCOPY;  Service: Endoscopy;  Laterality: N/A;  . ILEOSTOMY Right 11/27/2018   Procedure: ILEOSTOMY;  Surgeon: Jules Husbands, MD;  Location: [facility] ORS;  Service: General;  Laterality: Right;  . LAPAROSCOPIC SIGMOID COLECTOMY N/A 11/27/2018   Procedure: LAPAROSCOPIC SIGMOID COLECTOMY;  Surgeon: Jules Husbands, MD;  Location: [facility] ORS;  Service: General;  Laterality: N/A;  . ORIF ANKLE FRACTURE Right 11/21/2018   Procedure: OPEN REDUCTION INTERNAL FIXATION  (ORIF) ANKLE FRACTURE;  Surgeon: Corky Mull, MD;  Location: [facility] ORS;  Service: Orthopedics;  Laterality: Right;  . PORTACATH PLACEMENT N/A 03/12/2019   Procedure: INSERTION PORT-A-CATH;  Surgeon: Jules Husbands, MD;  Location: [facility] ORS;  Service: General;  Laterality: N/A;  . REDUCTION MAMMAPLASTY    . SHOULDER ARTHROSCOPY WITH OPEN ROTATOR CUFF REPAIR AND DISTAL CLAVICLE ACROMINECTOMY Right 03/26/2017   Procedure: right shoulder arthroscopy, arthroscopic subacromial decompression, distal clavicle excision, mini open rotator cuff repair;  Surgeon: Thornton Park, MD;  Location: [facility] ORS;  Service: Orthopedics;  Laterality: Right;    Family History  Problem Relation Age of Onset  . Hypertension Mother   . Breast cancer Mother 68  . Breast cancer Sister 40    Social History:  reports that she quit smoking about 10 years ago. Her smoking use included cigarettes. She smoked 1.00 pack per day. She has never used smokeless tobacco. She reports current drug use. Drug: Marijuana. She reports that she does not drink alcohol.  Allergies:  Allergies  Allergen Reactions  . Ace Inhibitors Swelling  . Angiotensin Receptor Blockers Swelling  . Sulfa Antibiotics Itching  . Chlorthalidone Rash  . Flexeril [Cyclobenzaprine] Rash    Medications reviewed.    ROS Full ROS performed and is otherwise negative other than what is stated in HPI   BP (!) 180/88   Pulse 100   Temp (!) 97.3 F (36.3 C) (Skin)   Ht 5\' 4"  (1.626 m)   Wt 154 lb (69.9 kg)   SpO2 97%   BMI 26.43 kg/m   Physical Exam Vitals signs and nursing note reviewed. Exam conducted with a chaperone present.  Constitutional:      Appearance: Normal appearance. She is normal weight.  Eyes:     General: No scleral icterus.       Right eye: No discharge.        Left eye: No discharge.  Neck:     Musculoskeletal: Normal range of motion and neck supple. No neck rigidity or muscular tenderness.  Cardiovascular:     Rate and  Rhythm: Normal rate.  Pulmonary:     Effort: Pulmonary effort is normal.     Breath sounds: Normal breath sounds. No stridor.  Abdominal:     General: Abdomen is flat. There is no distension.     Palpations: There is no mass.     Tenderness: There is no abdominal tenderness. There is no rebound.     Hernia: No hernia is present.     Comments: Ileostomy working well, parastomal hernia  Musculoskeletal: Normal range of motion.        General: No swelling or tenderness.  Skin:    General: Skin is warm and dry.  Neurological:     General: No focal deficit present.     Mental Status: She is alert and oriented to person, place, and time.  Psychiatric:        Mood and Affect: Mood normal.        Behavior: Behavior normal.        Thought Content: Thought content normal.        Assessment/Plan: 73 year old female with rectosigmoid cancer and  concomitant known cancer.  She is recovering well and has completed chemotherapy.  She does have a diverting ileostomy will need to be taken down.  Prior to that I would like to have GI scope her to make sure there is no other potential strictures or abnormality given the unequivocal findings on the barium enema.  He does have some chronic pain and we will arrange for her to go to the chronic pain clinic. We will tentatively schedule her for take down of the ileostomy with parastomal hernia repair.  Procedure discussed with the patient in detail.  Risk benefit of possible implications including but not limited to, bleeding, infection injury to adjacent structures and recurrences.  She understands and wished to proceed  If colonoscopy is ok she may be schedule for ileostomy takedown.  Greater than 50% of the 40 minutes  visit was spent in counseling/coordination of care   Caroleen Hamman, MD [place] Surgeon

## 2019-06-16 ENCOUNTER — Other Ambulatory Visit: Payer: Self-pay

## 2019-06-16 ENCOUNTER — Other Ambulatory Visit: Payer: Medicare Other | Admitting: Nurse Practitioner

## 2019-06-16 ENCOUNTER — Encounter: Payer: Self-pay | Admitting: Nurse Practitioner

## 2019-06-16 DIAGNOSIS — Z515 Encounter for palliative care: Secondary | ICD-10-CM | POA: Diagnosis not present

## 2019-06-16 DIAGNOSIS — I10 Essential (primary) hypertension: Secondary | ICD-10-CM | POA: Diagnosis not present

## 2019-06-16 DIAGNOSIS — Z9889 Other specified postprocedural states: Secondary | ICD-10-CM

## 2019-06-16 NOTE — Progress Notes (Signed)
[facility] Consult Note Telephone: [phone]  Fax: [phone]  PATIENT NAME: Gloria Allen DOB: [birth year] MRN: [id number]  PRIMARY CARE PROVIDER:   [facility], [place],  [place] [address]  RESPONSIBLE PARTY:   Self   Due to the COVID-19 crisis, this visit was done via telemedicine from my office and it was initiated and consent by this patient and or family.  I was asked to see Gloria Allen for Palliative Consult for goals of care.   RECOMMENDATIONS and PLAN:  1. ACP: made a DNR; placed in Epic/Vynca. Mailed a blank MOST form and hard choice book to review and will complete at next [facility] f/u visit after Gloria. Allen reviews. Gloria. Allen verbalized she did want to undergo surgery for colostomy reversal but would not want further chemotherapy/radiation should cancer re-occur.  2. Generalized weakness; Encourage energy conservation and rest times.  3. Pain secondary to Malignant neoplasm of colon s/p partial resection with ileostomy/colostomy, small cell lung cancer s/p chemotherapy/radiation will continue to monitor on pain scale, monitor efficacy vs adverse side effects. She currently uses BC powder which is the only medication that helps. She shared previously vicodin, tramadol, tylenol, ibprofen, was not effective. Will further discuss with Gloria Allen to see about symptom management clinic or if will defer back to primary for management.   4. Nausea and vomiting continue antimedic; encourage oral hydration; Discussed with Gloria Allen will see about Gloria Allen sending in a rx for refill, compazine.   5. Palliative care encounter Palliative medicine team will continue to support patient, patient's family, and medical team. Visit consisted of counseling and education dealing with the complex and emotionally intense issues of symptom management and palliative care in the setting of  serious and potentially life-threatening illness  I spent 60 minutes providing this consultation,  from 11:00am to 12:00pm. More than 50% of the time in this consultation was spent coordinating communication.   HISTORY OF PRESENT ILLNESS:  Gloria Allen is a 73 y.o. year old female with multiple medical problems including Malignant neoplasm of colon s/p partial resection with ileostomy/colostomy, small cell lung cancer s/p chemotherapy/radiation, hypertension, gerd, headache, degenerative disc disease,  asthma, arthritis, lumbar facet syndrome, lumbar disc protrusion, anxiety, depression, PTSD, right shoulder arthroscopy, reduction mammoplasty, port-a-cath placement, right orif ankle, appendectomy, abdominal hysterectomy, D&C of uterus. She was hospitalized 2 / 21 / 2020 to 2 / 24 / 2020 for closed right ankle fracture s/p ORIF, severe episodes of major depression. She was hospitalised 2 / 27 / 2020 to 3/4 /2020 for colon cancer with sigmoid colectomy. She was presented at the tumor board on 01/30/2019 as a new patient for new positive pathology. Recommendations were for chemo - radiation therapy. She completed chemo and radiation. Last oncology visit with Gloria. Janese Allen 9 / 10 / 2020 small cell lung cancer with CT results discussed right upper lung nodule has decreased to 3 mm, left up lobe 1.8 cm lung nodule. She had a good response to concurrent chemoradiation and the plan is to continue active surveillance with CT scans every 3 months. Prophylactic cranial IR radiation in small cell lung cancer limited staging was discussed though Gloria Allen did not wish to pursue at that time.: cancer stage 3B surgery and she did not wish to pursue any adjunct chemotherapy and wish to continue surveillance Imaging that she remains in remission. She will see surgery  for colostomy takedown and port removal. She does continue to feel intermittently fatigued secondary to chemotherapy and as required IV fluids periodically with potassium  replacement. Show me the sides at home living independently, driving. She is a retired Museum/gallery curator. She is divorced. She does have three children. I called Gloria Allen for scheduled and is so palliative care visit. Talked about purpose palliative care visit and Gloria Allen in agreement. We talked about how she was feeling today. She verbalize that she is doing okay except she has intermittent back pain. She talked about trying multiple different kinds of medications including Tramadol, Tylenol, ibuprofen, hydrocodone without Improvement. She shared that some days it hurts worse than others. We talked about pain regiment. We talked about Tylenol taking on a routine basis. We talked about past medical history in the setting chronic disease. We talked about her lung and colon cancer. We talked about chemo and radiation. We talked about her hopes to have upcoming surgery to reverse her ileostomy colostomy. We talked about realistic expectations with surgery. We talked about her last appointment with Gloria Allen her oncologist. We talked about her requiring to receive IV fluids and potassium replacement. She talked about worsening nausea and vomiting with potassium pills. We talked about probiotics. We talked about antiemetics for what she does have promethazine and Compazine. She shared that she was nauseous this past weekend and took her last pills. We talked about appetite which has been Fair. Discuss will follow up with Gloria. Janese Allen to see if she can refill. We talked about her follow up surveillance diagnostic testing. We talked about medical goals of care including aggressive versus conservative versus comfort care. Discussed if she was to have reoccurrence of cancer if she would return to proceeding with aggressive interventions with chemotherapy, radiation and surgery. She replies that she would not redo chemo and radiation. We talked about code status as in her medical records she is a full code, then DNR. She verbalize that she  does not want to be resuscitated and wishes to be a DNR. She does not have an out of facility [place] form at home. She was in agreement for 1 to be completed, placed an Epic/Vynca and mailed to her. We talked about her choice book with MOST form. Gloria Baldridge was in agreement to having that mailed to her as well for review. We talked about Life review. We talked about her three children two boys and one girl. She verbalize that her ex-husband has been very sick for which her daughter has been taking care of him. We talked about if she wasn't able to speak for herself who would she wants to be able to verbalize her needs. She was an agreement to her children but more so the boys then her daughter as her daughter continues to take care of her sick ex-husband and she didn't want to put too much of a burden on her. We talked about her functional level as she does drive currently, walks with a cane or Walker when she gets more fatigued. We did talk about  fatigue, resting and energy conservation. We talked about role of palliative care and plan of care. we talked about follow up palliative care visit and Gloria Leuthold in agreement to schedule, went ahead and scheduled in 4 weeks. Discussed will contact oncology for further discussion of pain management to see if oncology would continue or if its primary provider in addition to antiemetic.  AJCC staging  T:pT3 N: N1a M: Mx Group  B colon cancer.   Palliative Care was asked to help address goals of care.   CODE STATUS: DNR  PPS: 60% HOSPICE ELIGIBILITY/DIAGNOSIS: TBD  PAST MEDICAL HISTORY:  Past Medical History:  Diagnosis Date   Anxiety    Arthritis    Asthma    Cervical central spinal stenosis (C3-C7) (worse at C4-5) 07/30/2017   Cervical foraminal stenosis (C4-5 and C5-6) (Bilateral) (L>R) 07/30/2017   Chronic lower extremity pain (Fourth Area of Pain) (Bilateral) (R>L) 06/11/2017   Chronic neck pain (Primary Area of Pain) (Bilateral) (R>L)  06/11/2017   Chronic sacroiliac joint pain (Bilateral) (R>L) 06/11/2017   Chronic shoulder pain (Tertiary Area of Pain) (Right) 06/11/2017   Chronic upper extremity pain (Fifth Area of Pain) (Bilateral) (R>L) 06/11/2017   Colon cancer ([place]) 11/2018   Partial colon resection   DDD (degenerative disc disease), cervical 07/30/2017   DDD (degenerative disc disease), lumbar 07/30/2017   Depression    DISH (diffuse idiopathic skeletal hyperostosis) 07/30/2017   Dyspnea    with exertion   Entrapment syndrome 06/20/2017   2002 on R and 2010 on L   Full thickness rotator cuff tear 06/12/2017   GERD (gastroesophageal reflux disease)    Grade 1 Anterolisthesis of L4 over L5 07/30/2017   Headache    Hx: UTI (urinary tract infection)    Hypertension    Inflammation of joint of shoulder region 01/15/2017   Lumbar central spinal stenosis (L4-5) 07/30/2017   Lumbar disc protrusion (Left: L5-S1) (Right: L1-2) 07/30/2017   L5-S1 left foraminal protrusion with L5 impingement. L1-2 right paracentral protrusion without impingement.   Lumbar facet arthropathy (Bilateral) 07/30/2017   Lumbar facet syndrome (Bilateral) (R>L) 07/30/2017   Lung cancer ([place]) 11/2018   Chemo  and rad tx's   Osteoarthritis of shoulder (Right) 01/15/2017    SOCIAL HX:  Social History   Tobacco Use   Smoking status: Former Smoker    Packs/day: 1.00    Types: Cigarettes    Quit date: 2010    Years since quitting: 10.7   Smokeless tobacco: Never Used   Tobacco comment: quit over 30 years ago   Substance Use Topics   Alcohol use: No    ALLERGIES:  Allergies  Allergen Reactions   Ace Inhibitors Swelling   Angiotensin Receptor Blockers Swelling   Sulfa Antibiotics Itching   Chlorthalidone Rash   Flexeril [Cyclobenzaprine] Rash     PERTINENT MEDICATIONS:  Outpatient Encounter Medications as of 06/16/2019  Medication Sig   albuterol (VENTOLIN HFA) 108 (90 Base) MCG/ACT inhaler Inhale 2  puffs into the lungs every 6 (six) hours as needed for wheezing or shortness of breath.   amLODipine (NORVASC) 10 MG tablet Take 1 tablet (10 mg total) by mouth daily.   benzonatate (TESSALON) 200 MG capsule Take 1 capsule (200 mg total) by mouth 3 (three) times daily as needed for cough.   BREO ELLIPTA 200-25 MCG/INH AEPB Inhale 1 puff into the lungs daily.   clonazePAM (KLONOPIN) 0.5 MG tablet Take 1 tablet (0.5 mg total) by mouth 3 (three) times daily.   cloNIDine (CATAPRES) 0.2 MG tablet Take 1 tablet (0.2 mg total) by mouth 2 (two) times daily.   hydrALAZINE (APRESOLINE) 100 MG tablet Take 1 tablet (100 mg total) by mouth 3 (three) times daily.   HYDROcodone-homatropine (HYCODAN) 5-1.5 MG/5ML syrup Take 5 mLs by mouth every 6 (six) hours as needed for cough.   hydrOXYzine (ATARAX/VISTARIL) 10 MG tablet Take 1 tablet (10 mg total) by mouth 3 (  three) times daily as needed for itching.   loperamide (IMODIUM) 2 MG capsule Take 2 capsules (4 mg total) by mouth 3 (three) times daily.   lovastatin (MEVACOR) 40 MG tablet Take 1 tablet (40 mg total) by mouth at bedtime.   meclizine (ANTIVERT) 12.5 MG tablet Take 1 tablet (12.5 mg total) by mouth 3 (three) times daily as needed for dizziness.   methocarbamol (ROBAXIN) 500 MG tablet Take 1 tablet (500 mg total) by mouth at bedtime as needed for muscle spasms.   metoprolol succinate (TOPROL-XL) 100 MG 24 hr tablet Take 1 tablet (100 mg total) by mouth daily. Take with or immediately following a meal.   mirtazapine (REMERON) 15 MG tablet take 1 tablet by mouth at bedtime for APPETITIE   montelukast (SINGULAIR) 10 MG tablet Take 1 tablet (10 mg total) by mouth at bedtime as needed.   OLANZapine (ZYPREXA) 10 MG tablet Take 1 tablet (10 mg total) by mouth at bedtime.   omeprazole (PRILOSEC) 40 MG capsule Take 1 capsule (40 mg total) by mouth 2 (two) times daily.   ondansetron (ZOFRAN) 8 MG tablet Take 1 tablet (8 mg total) by mouth 2 (two)  times daily as needed for refractory nausea / vomiting. Start on day 3 after carboplatin chemo.   oxybutynin (DITROPAN) 5 MG tablet Take 1 tablet (5 mg total) by mouth 2 (two) times daily.   potassium chloride SA (K-DUR) 20 MEQ tablet Take 1 tablet (20 mEq total) by mouth daily.   prochlorperazine (COMPAZINE) 10 MG tablet Take 1 tablet (10 mg total) by mouth every 6 (six) hours as needed (Nausea or vomiting).   sertraline (ZOLOFT) 100 MG tablet Take 2 tablets (200 mg total) by mouth at bedtime.   terazosin (HYTRIN) 1 MG capsule Take 1 capsule (1 mg total) by mouth at bedtime.   tiZANidine (ZANAFLEX) 2 MG tablet Take 1 tablet (2 mg total) by mouth every 6 (six) hours as needed for muscle spasms.   traZODone (DESYREL) 50 MG tablet Take 0.5-1 tablets (25-50 mg total) by mouth at bedtime as needed for sleep.   triamcinolone cream (KENALOG) 0.1 % Apply 1 application topically 2 (two) times daily as needed (itching).   Facility-Administered Encounter Medications as of 06/16/2019  Medication   sodium chloride flush (NS) 0.9 % injection 10 mL    PHYSICAL EXAM:  Deferred  Issac Moure Z Roselie Cirigliano, NP

## 2019-06-24 ENCOUNTER — Other Ambulatory Visit: Payer: Self-pay

## 2019-06-25 ENCOUNTER — Inpatient Hospital Stay: Payer: Medicare Other

## 2019-06-25 ENCOUNTER — Other Ambulatory Visit: Payer: Medicare Other

## 2019-06-25 ENCOUNTER — Other Ambulatory Visit: Payer: Self-pay | Admitting: *Deleted

## 2019-06-25 ENCOUNTER — Ambulatory Visit: Payer: Medicare Other

## 2019-06-25 ENCOUNTER — Other Ambulatory Visit: Payer: Self-pay

## 2019-06-25 VITALS — BP 162/79 | HR 78 | Temp 96.1°F | Resp 20

## 2019-06-25 DIAGNOSIS — M5031 Other cervical disc degeneration,  high cervical region: Secondary | ICD-10-CM | POA: Diagnosis not present

## 2019-06-25 DIAGNOSIS — M4816 Ankylosing hyperostosis [Forestier], lumbar region: Secondary | ICD-10-CM | POA: Diagnosis not present

## 2019-06-25 DIAGNOSIS — I10 Essential (primary) hypertension: Secondary | ICD-10-CM | POA: Diagnosis not present

## 2019-06-25 DIAGNOSIS — C19 Malignant neoplasm of rectosigmoid junction: Secondary | ICD-10-CM | POA: Diagnosis not present

## 2019-06-25 DIAGNOSIS — K219 Gastro-esophageal reflux disease without esophagitis: Secondary | ICD-10-CM | POA: Diagnosis not present

## 2019-06-25 DIAGNOSIS — G8929 Other chronic pain: Secondary | ICD-10-CM | POA: Diagnosis not present

## 2019-06-25 DIAGNOSIS — Z87440 Personal history of urinary (tract) infections: Secondary | ICD-10-CM | POA: Diagnosis not present

## 2019-06-25 DIAGNOSIS — C3490 Malignant neoplasm of unspecified part of unspecified bronchus or lung: Secondary | ICD-10-CM

## 2019-06-25 DIAGNOSIS — R5383 Other fatigue: Secondary | ICD-10-CM | POA: Diagnosis not present

## 2019-06-25 DIAGNOSIS — E876 Hypokalemia: Secondary | ICD-10-CM

## 2019-06-25 DIAGNOSIS — Z87891 Personal history of nicotine dependence: Secondary | ICD-10-CM | POA: Diagnosis not present

## 2019-06-25 DIAGNOSIS — Z79899 Other long term (current) drug therapy: Secondary | ICD-10-CM | POA: Diagnosis not present

## 2019-06-25 DIAGNOSIS — Z9221 Personal history of antineoplastic chemotherapy: Secondary | ICD-10-CM | POA: Diagnosis not present

## 2019-06-25 DIAGNOSIS — Z7951 Long term (current) use of inhaled steroids: Secondary | ICD-10-CM | POA: Diagnosis not present

## 2019-06-25 DIAGNOSIS — M1990 Unspecified osteoarthritis, unspecified site: Secondary | ICD-10-CM | POA: Diagnosis not present

## 2019-06-25 DIAGNOSIS — C187 Malignant neoplasm of sigmoid colon: Secondary | ICD-10-CM

## 2019-06-25 DIAGNOSIS — J45909 Unspecified asthma, uncomplicated: Secondary | ICD-10-CM | POA: Diagnosis not present

## 2019-06-25 DIAGNOSIS — M4812 Ankylosing hyperostosis [Forestier], cervical region: Secondary | ICD-10-CM | POA: Diagnosis not present

## 2019-06-25 DIAGNOSIS — M549 Dorsalgia, unspecified: Secondary | ICD-10-CM | POA: Diagnosis not present

## 2019-06-25 DIAGNOSIS — Z9049 Acquired absence of other specified parts of digestive tract: Secondary | ICD-10-CM | POA: Diagnosis not present

## 2019-06-25 DIAGNOSIS — C3411 Malignant neoplasm of upper lobe, right bronchus or lung: Secondary | ICD-10-CM | POA: Diagnosis not present

## 2019-06-25 LAB — COMPREHENSIVE METABOLIC PANEL
ALT: 15 U/L (ref 0–44)
AST: 20 U/L (ref 15–41)
Albumin: 3.9 g/dL (ref 3.5–5.0)
Alkaline Phosphatase: 86 U/L (ref 38–126)
Anion gap: 12 (ref 5–15)
BUN: 10 mg/dL (ref 8–23)
CO2: 28 mmol/L (ref 22–32)
Calcium: 9.3 mg/dL (ref 8.9–10.3)
Chloride: 99 mmol/L (ref 98–111)
Creatinine, Ser: 0.61 mg/dL (ref 0.44–1.00)
GFR calc Af Amer: >60 mL/min (ref >60)
GFR calc non Af Amer: >60 mL/min (ref >60)
Glucose, Bld: 140 mg/dL — ABNORMAL HIGH (ref 70–99)
Potassium: 3 mmol/L — ABNORMAL LOW (ref 3.5–5.1)
Sodium: 139 mmol/L (ref 135–145)
Total Bilirubin: 0.4 mg/dL (ref 0.3–1.2)
Total Protein: 7.2 g/dL (ref 6.5–8.1)

## 2019-06-25 LAB — CBC WITH DIFFERENTIAL/PLATELET
Abs Immature Granulocytes: 0 10*3/uL (ref 0.00–0.07)
Basophils Absolute: 0.1 10*3/uL (ref 0.0–0.1)
Basophils Relative: 1 %
Eosinophils Absolute: 0.1 10*3/uL (ref 0.0–0.5)
Eosinophils Relative: 3 %
HCT: 33.6 % — ABNORMAL LOW (ref 36.0–46.0)
Hemoglobin: 10.7 g/dL — ABNORMAL LOW (ref 12.0–15.0)
Immature Granulocytes: 0 %
Lymphocytes Relative: 17 %
Lymphs Abs: 1 10*3/uL (ref 0.7–4.0)
MCH: 30.2 pg (ref 26.0–34.0)
MCHC: 31.8 g/dL (ref 30.0–36.0)
MCV: 94.9 fL (ref 80.0–100.0)
Monocytes Absolute: 0.4 10*3/uL (ref 0.1–1.0)
Monocytes Relative: 7 %
Neutro Abs: 4 10*3/uL (ref 1.7–7.7)
Neutrophils Relative %: 72 %
Platelets: 254 10*3/uL (ref 150–400)
RBC: 3.54 MIL/uL — ABNORMAL LOW (ref 3.87–5.11)
RDW: 13.7 % (ref 11.5–15.5)
WBC: 5.6 10*3/uL (ref 4.0–10.5)
nRBC: 0 % (ref 0.0–0.2)

## 2019-06-25 MED ORDER — POTASSIUM CHLORIDE IN NACL 20-0.9 MEQ/L-% IV SOLN
Freq: Once | INTRAVENOUS | Status: AC
  Administered 2019-06-25: 1000 mL via INTRAVENOUS
  Filled 2019-06-25: qty 1000

## 2019-06-25 MED ORDER — POTASSIUM CHLORIDE CRYS ER 20 MEQ PO TBCR: 20.0000 meq | EXTENDED_RELEASE_TABLET | Freq: Two times a day (BID) | ORAL | 0 refills | Status: DC

## 2019-06-25 MED ORDER — HEPARIN SOD (PORK) LOCK FLUSH 100 UNIT/ML IV SOLN
500.0000 [IU] | Freq: Once | INTRAVENOUS | Status: AC
  Administered 2019-06-25: 500 [IU] via INTRAVENOUS
  Filled 2019-06-25: qty 5

## 2019-06-25 MED ORDER — SODIUM CHLORIDE 0.9% FLUSH
10.0000 mL | INTRAVENOUS | Status: DC | PRN
  Administered 2019-06-25 (×2): 10 mL via INTRAVENOUS
  Filled 2019-06-25: qty 10

## 2019-06-25 MED ORDER — SODIUM CHLORIDE 0.9 % IV SOLN: Freq: Once | INTRAVENOUS | Status: DC

## 2019-06-26 DIAGNOSIS — S82841S Displaced bimalleolar fracture of right lower leg, sequela: Secondary | ICD-10-CM | POA: Diagnosis not present

## 2019-07-07 ENCOUNTER — Telehealth: Payer: Self-pay

## 2019-07-07 ENCOUNTER — Telehealth: Payer: Self-pay | Admitting: Gastroenterology

## 2019-07-07 MED ORDER — NA SULFATE-K SULFATE-MG SULF 17.5-3.13-1.6 GM/177ML PO SOLN: 354.0000 mL | Freq: Once | ORAL | 0 refills | Status: AC

## 2019-07-07 NOTE — Telephone Encounter (Signed)
Pt left vm she thinks she has apts this week to have tests done she is trying to update her calender please call pt

## 2019-07-07 NOTE — Telephone Encounter (Signed)
Patient states she lost her prep and needs it resent to the pharmacy. Resent it to her pharmacy

## 2019-07-07 NOTE — Telephone Encounter (Signed)
Already called patient and informed patient when her colonoscopy test is and when her covid test is. Resent prep to the pharmacy

## 2019-07-08 ENCOUNTER — Other Ambulatory Visit: Payer: Self-pay | Admitting: *Deleted

## 2019-07-08 ENCOUNTER — Telehealth: Payer: Self-pay

## 2019-07-08 DIAGNOSIS — C187 Malignant neoplasm of sigmoid colon: Secondary | ICD-10-CM

## 2019-07-08 DIAGNOSIS — C3490 Malignant neoplasm of unspecified part of unspecified bronchus or lung: Secondary | ICD-10-CM

## 2019-07-08 NOTE — Telephone Encounter (Signed)
Pre-visit assessment call attempted prior to [place] appointment on 07/09/2019 with Dr. Janese Banks. No answer / Voicemail full / unable to leave msg.

## 2019-07-09 ENCOUNTER — Other Ambulatory Visit: Payer: Self-pay | Admitting: *Deleted

## 2019-07-09 ENCOUNTER — Inpatient Hospital Stay (HOSPITAL_BASED_OUTPATIENT_CLINIC_OR_DEPARTMENT_OTHER): Payer: Medicare Other | Admitting: Oncology

## 2019-07-09 ENCOUNTER — Inpatient Hospital Stay: Payer: Medicare Other | Attending: Oncology

## 2019-07-09 ENCOUNTER — Other Ambulatory Visit: Payer: Self-pay

## 2019-07-09 ENCOUNTER — Encounter: Payer: Self-pay | Admitting: Oncology

## 2019-07-09 ENCOUNTER — Inpatient Hospital Stay: Payer: Medicare Other

## 2019-07-09 VITALS — BP 161/90 | HR 93 | Temp 97.2°F | Resp 16 | Wt 152.8 lb

## 2019-07-09 DIAGNOSIS — R0600 Dyspnea, unspecified: Secondary | ICD-10-CM | POA: Insufficient documentation

## 2019-07-09 DIAGNOSIS — Z87891 Personal history of nicotine dependence: Secondary | ICD-10-CM | POA: Insufficient documentation

## 2019-07-09 DIAGNOSIS — M4810 Ankylosing hyperostosis [Forestier], site unspecified: Secondary | ICD-10-CM | POA: Insufficient documentation

## 2019-07-09 DIAGNOSIS — J45909 Unspecified asthma, uncomplicated: Secondary | ICD-10-CM | POA: Insufficient documentation

## 2019-07-09 DIAGNOSIS — K219 Gastro-esophageal reflux disease without esophagitis: Secondary | ICD-10-CM | POA: Insufficient documentation

## 2019-07-09 DIAGNOSIS — G8929 Other chronic pain: Secondary | ICD-10-CM | POA: Diagnosis not present

## 2019-07-09 DIAGNOSIS — C3411 Malignant neoplasm of upper lobe, right bronchus or lung: Secondary | ICD-10-CM | POA: Insufficient documentation

## 2019-07-09 DIAGNOSIS — C187 Malignant neoplasm of sigmoid colon: Secondary | ICD-10-CM | POA: Diagnosis not present

## 2019-07-09 DIAGNOSIS — Z9221 Personal history of antineoplastic chemotherapy: Secondary | ICD-10-CM | POA: Insufficient documentation

## 2019-07-09 DIAGNOSIS — Z79899 Other long term (current) drug therapy: Secondary | ICD-10-CM | POA: Diagnosis not present

## 2019-07-09 DIAGNOSIS — C3490 Malignant neoplasm of unspecified part of unspecified bronchus or lung: Secondary | ICD-10-CM

## 2019-07-09 DIAGNOSIS — F329 Major depressive disorder, single episode, unspecified: Secondary | ICD-10-CM | POA: Diagnosis not present

## 2019-07-09 DIAGNOSIS — F419 Anxiety disorder, unspecified: Secondary | ICD-10-CM | POA: Insufficient documentation

## 2019-07-09 DIAGNOSIS — Z923 Personal history of irradiation: Secondary | ICD-10-CM | POA: Insufficient documentation

## 2019-07-09 DIAGNOSIS — I10 Essential (primary) hypertension: Secondary | ICD-10-CM | POA: Insufficient documentation

## 2019-07-09 DIAGNOSIS — Z933 Colostomy status: Secondary | ICD-10-CM | POA: Insufficient documentation

## 2019-07-09 LAB — CBC WITH DIFFERENTIAL/PLATELET
Abs Immature Granulocytes: 0.02 10*3/uL (ref 0.00–0.07)
Basophils Absolute: 0 10*3/uL (ref 0.0–0.1)
Basophils Relative: 1 %
Eosinophils Absolute: 0.2 10*3/uL (ref 0.0–0.5)
Eosinophils Relative: 3 %
HCT: 37.4 % (ref 36.0–46.0)
Hemoglobin: 12 g/dL (ref 12.0–15.0)
Immature Granulocytes: 0 %
Lymphocytes Relative: 16 %
Lymphs Abs: 0.8 10*3/uL (ref 0.7–4.0)
MCH: 29.9 pg (ref 26.0–34.0)
MCHC: 32.1 g/dL (ref 30.0–36.0)
MCV: 93 fL (ref 80.0–100.0)
Monocytes Absolute: 0.3 10*3/uL (ref 0.1–1.0)
Monocytes Relative: 6 %
Neutro Abs: 3.9 10*3/uL (ref 1.7–7.7)
Neutrophils Relative %: 74 %
Platelets: 218 10*3/uL (ref 150–400)
RBC: 4.02 MIL/uL (ref 3.87–5.11)
RDW: 12.5 % (ref 11.5–15.5)
WBC: 5.3 10*3/uL (ref 4.0–10.5)
nRBC: 0 % (ref 0.0–0.2)

## 2019-07-09 LAB — COMPREHENSIVE METABOLIC PANEL
ALT: 14 U/L (ref 0–44)
AST: 23 U/L (ref 15–41)
Albumin: 4.2 g/dL (ref 3.5–5.0)
Alkaline Phosphatase: 102 U/L (ref 38–126)
Anion gap: 13 (ref 5–15)
BUN: 10 mg/dL (ref 8–23)
CO2: 26 mmol/L (ref 22–32)
Calcium: 9.8 mg/dL (ref 8.9–10.3)
Chloride: 101 mmol/L (ref 98–111)
Creatinine, Ser: 0.64 mg/dL (ref 0.44–1.00)
GFR calc Af Amer: >60 mL/min (ref >60)
GFR calc non Af Amer: >60 mL/min (ref >60)
Glucose, Bld: 185 mg/dL — ABNORMAL HIGH (ref 70–99)
Potassium: 3.7 mmol/L (ref 3.5–5.1)
Sodium: 140 mmol/L (ref 135–145)
Total Bilirubin: 0.3 mg/dL (ref 0.3–1.2)
Total Protein: 7.9 g/dL (ref 6.5–8.1)

## 2019-07-09 MED ORDER — OLANZAPINE 10 MG PO TABS: 10.0000 mg | ORAL_TABLET | Freq: Every day | ORAL | 0 refills | Status: DC

## 2019-07-09 MED ORDER — ONDANSETRON HCL 8 MG PO TABS: 8.0000 mg | ORAL_TABLET | Freq: Three times a day (TID) | ORAL | 0 refills | Status: DC | PRN

## 2019-07-09 MED ORDER — LIDOCAINE-PRILOCAINE 2.5-2.5 % EX CREA: 1.0000 "application " | TOPICAL_CREAM | CUTANEOUS | 1 refills | Status: DC

## 2019-07-09 NOTE — Progress Notes (Signed)
Hematology/Oncology Consult note [facility]  [phone] [phone]  Patient Care Team: Inc, [facility] as PCP - General Telford Nab, [place] as Registered Nurse   Name of the patient: Gloria Allen  [id number]  1946-03-27   Date of visit: 07/09/19  Diagnosis- 1.Stage III colon cancer status post surgery 2.Limited stage small cell lung cancer  Chief complaint/ Reason for visit- routine f/u of lung cancer s/p chemo/RT  Heme/Onc history: patient is a 73 year old African-American female who recently underwent screening colonoscopy by Dr. Bonna Gains.Colonoscopy showed villous nonobstructing large mass in the sigmoid colon 12 cm proximal to the anus. Mass was partially circumferential. Biopsy showed invasive adenocarcinoma. Patient was seen by Dr. Gretchen Short underwent hemicolectomy with lymph node sampling on 11/27/2018. Final pathology showed invasive colorectal carcinoma 3.5 cm, grade 2. Tumor invades through muscularis propria into the peri-colorectal tissue. Margins negative. Tumor deposits present, 2. 1 out of 9 lymph nodes examined was positive for malignancy. [id number].Patient was feeling poorly after surgery and she had no social support and decided against adjuvant chemotherapy.  Patient underwent CT chest to complete her staging work-up which showed 2 lung nodules one in the right upper lobe and one in the left left upper lobe.This was followed by a PET CT scan which showed with the left upper lobe nodule was not hypermetabolic.The right upper lobe lung nodule measure 1.6 cm with an SUV of 6.8. No hypermetabolic axillary mediastinal or hilar lymphadenopathy or evidence of metastatic disease elsewhere. Patient underwent CT-guided biopsy of the right upper lobe lung nodule which was consistent with small cell lung cancerpositive for CD56 and TTF-1.Patient started concurrent chemoradiation in June 2020. Carboplatin  chosen instead of cisplatin with etoposide given her age, poor social support and concern for renal dysfunction. Patient completed 4 cycles of carboplatin and etoposide on 05/19/2019   Interval history- she feels much better as compared to her last visit. She is not sitting in a wheelchair anymore. She does have chronic back pain and will be seeing pain management soon.  ECOG PS- 1 Pain scale- 4 Opioid associated constipation- no  Review of systems- Review of Systems  Constitutional: Positive for malaise/fatigue. Negative for chills, fever and weight loss.  HENT: Negative for congestion, ear discharge and nosebleeds.   Eyes: Negative for blurred vision.  Respiratory: Negative for cough, hemoptysis, sputum production, shortness of breath and wheezing.   Cardiovascular: Negative for chest pain, palpitations, orthopnea and claudication.  Gastrointestinal: Negative for abdominal pain, blood in stool, constipation, diarrhea, heartburn, melena, nausea and vomiting.  Genitourinary: Negative for dysuria, flank pain, frequency, hematuria and urgency.  Musculoskeletal: Positive for back pain. Negative for joint pain and myalgias.  Skin: Negative for rash.  Neurological: Negative for dizziness, tingling, focal weakness, seizures, weakness and headaches.  Endo/Heme/Allergies: Does not bruise/bleed easily.  Psychiatric/Behavioral: Negative for depression and suicidal ideas. The patient does not have insomnia.        Allergies  Allergen Reactions  . Ace Inhibitors Swelling  . Angiotensin Receptor Blockers Swelling  . Sulfa Antibiotics Itching  . Chlorthalidone Rash  . Flexeril [Cyclobenzaprine] Rash     Past Medical History:  Diagnosis Date  . Anxiety   . Arthritis   . Asthma   . Cervical central spinal stenosis (C3-C7) (worse at C4-5) 07/30/2017  . Cervical foraminal stenosis (C4-5 and C5-6) (Bilateral) (L>R) 07/30/2017  . Chronic lower extremity pain (Fourth Area of Pain) (Bilateral)  (R>L) 06/11/2017  . Chronic neck pain (Primary Area of  Pain) (Bilateral) (R>L) 06/11/2017  . Chronic sacroiliac joint pain (Bilateral) (R>L) 06/11/2017  . Chronic shoulder pain [facility] Area of Pain) (Right) 06/11/2017  . Chronic upper extremity pain (Fifth Area of Pain) (Bilateral) (R>L) 06/11/2017  . Colon cancer ([place]) 11/2018   Partial colon resection  . DDD (degenerative disc disease), cervical 07/30/2017  . DDD (degenerative disc disease), lumbar 07/30/2017  . Depression   . DISH (diffuse idiopathic skeletal hyperostosis) 07/30/2017  . Dyspnea    with exertion  . Entrapment syndrome 06/20/2017   2002 on R and 2010 on L  . Full thickness rotator cuff tear 06/12/2017  . GERD (gastroesophageal reflux disease)   . Grade 1 Anterolisthesis of L4 over L5 07/30/2017  . Headache   . Hx: UTI (urinary tract infection)   . Hypertension   . Inflammation of joint of shoulder region 01/15/2017  . Lumbar central spinal stenosis (L4-5) 07/30/2017  . Lumbar disc protrusion (Left: L5-S1) (Right: L1-2) 07/30/2017   L5-S1 left foraminal protrusion with L5 impingement. L1-2 right paracentral protrusion without impingement.  . Lumbar facet arthropathy (Bilateral) 07/30/2017  . Lumbar facet syndrome (Bilateral) (R>L) 07/30/2017  . Lung cancer ([place]) 11/2018   Chemo  and rad tx's  . Osteoarthritis of shoulder (Right) 01/15/2017     Past Surgical History:  Procedure Laterality Date  . ABDOMINAL HYSTERECTOMY    . APPENDECTOMY    . BREAST REDUCTION SURGERY Bilateral   . COLONOSCOPY WITH PROPOFOL N/A 11/05/2018   Procedure: COLONOSCOPY WITH PROPOFOL;  Surgeon: Virgel Manifold, MD;  Location: [facility] ENDOSCOPY;  Service: Endoscopy;  Laterality: N/A;  . COLONOSCOPY WITH PROPOFOL N/A 11/06/2018   Procedure: COLONOSCOPY WITH PROPOFOL;  Surgeon: Virgel Manifold, MD;  Location: [facility] ENDOSCOPY;  Service: Endoscopy;  Laterality: N/A;  . COLOSTOMY    . DILATION AND CURETTAGE OF UTERUS    .  ESOPHAGOGASTRODUODENOSCOPY (EGD) WITH PROPOFOL N/A 11/05/2018   Procedure: ESOPHAGOGASTRODUODENOSCOPY (EGD) WITH PROPOFOL;  Surgeon: Virgel Manifold, MD;  Location: [facility] ENDOSCOPY;  Service: Endoscopy;  Laterality: N/A;  . ESOPHAGOGASTRODUODENOSCOPY (EGD) WITH PROPOFOL N/A 03/27/2019   Procedure: ESOPHAGOGASTRODUODENOSCOPY (EGD) WITH PROPOFOL;  Surgeon: Lucilla Lame, MD;  Location: [facility] ENDOSCOPY;  Service: Endoscopy;  Laterality: N/A;  . ILEOSTOMY Right 11/27/2018   Procedure: ILEOSTOMY;  Surgeon: Jules Husbands, MD;  Location: [facility] ORS;  Service: General;  Laterality: Right;  . LAPAROSCOPIC SIGMOID COLECTOMY N/A 11/27/2018   Procedure: LAPAROSCOPIC SIGMOID COLECTOMY;  Surgeon: Jules Husbands, MD;  Location: [facility] ORS;  Service: General;  Laterality: N/A;  . ORIF ANKLE FRACTURE Right 11/21/2018   Procedure: OPEN REDUCTION INTERNAL FIXATION (ORIF) ANKLE FRACTURE;  Surgeon: Corky Mull, MD;  Location: [facility] ORS;  Service: Orthopedics;  Laterality: Right;  . PORTACATH PLACEMENT N/A 03/12/2019   Procedure: INSERTION PORT-A-CATH;  Surgeon: Jules Husbands, MD;  Location: [facility] ORS;  Service: General;  Laterality: N/A;  . REDUCTION MAMMAPLASTY    . SHOULDER ARTHROSCOPY WITH OPEN ROTATOR CUFF REPAIR AND DISTAL CLAVICLE ACROMINECTOMY Right 03/26/2017   Procedure: right shoulder arthroscopy, arthroscopic subacromial decompression, distal clavicle excision, mini open rotator cuff repair;  Surgeon: Thornton Park, MD;  Location: [facility] ORS;  Service: Orthopedics;  Laterality: Right;    Social History   Socioeconomic History  . Marital status: Divorced    Spouse name: Not on file  . Number of children: 3  . Years of education: Not on file  . Highest education level: 11th grade  Occupational History  . Not on file  Social Needs  .  Financial resource strain: Somewhat hard  . Food insecurity    Worry: Sometimes true    Inability: Never true  . Transportation needs    Medical: Yes    Non-medical: No   Tobacco Use  . Smoking status: Former Smoker    Packs/day: 1.00    Types: Cigarettes    Quit date: 2010    Years since quitting: 10.7  . Smokeless tobacco: Never Used  . Tobacco comment: quit over 30 years ago   Substance and Sexual Activity  . Alcohol use: No  . Drug use: Yes    Types: Marijuana  . Sexual activity: Never  Lifestyle  . Physical activity    Days per week: 0 days    Minutes per session: 0 min  . Stress: Not at all  Relationships  . Social Herbalist on phone: Never    Gets together: Never    Attends religious service: More than 4 times per year    Active member of club or organization: No    Attends meetings of clubs or organizations: Never    Relationship status: Divorced  . Intimate partner violence    Fear of current or ex partner: No    Emotionally abused: No    Physically abused: No    Forced sexual activity: No  Other Topics Concern  . 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 She receives disability. She has 3 children and several grandchildren. She lives alone. She drives. Uses a walker and shower chair at baseline.     Family History  Problem Relation Age of Onset  . Hypertension Mother   . Breast cancer Mother 75  . Breast cancer Sister 22     Current Outpatient Medications:  .  albuterol (VENTOLIN HFA) 108 (90 Base) MCG/ACT inhaler, Inhale 2 puffs into the lungs every 6 (six) hours as needed for wheezing or shortness of breath., Disp: 18 g, Rfl: 0 .  amLODipine (NORVASC) 10 MG tablet, Take 1 tablet (10 mg total) by mouth daily., Disp: 30 tablet, Rfl: 0 .  benzonatate (TESSALON) 200 MG capsule, Take 1 capsule (200 mg total) by mouth 3 (three) times daily as needed for cough., Disp: 60 capsule, Rfl: 0 .  hydrOXYzine (ATARAX/VISTARIL) 10 MG tablet, Take 1 tablet (10 mg total) by mouth 3 (three) times daily as needed for itching., Disp: 30 tablet, Rfl: 0 .  lovastatin (MEVACOR) 40 MG tablet,  Take 1 tablet (40 mg total) by mouth at bedtime., Disp: 30 tablet, Rfl: 0 .  meclizine (ANTIVERT) 12.5 MG tablet, Take 1 tablet (12.5 mg total) by mouth 3 (three) times daily as needed for dizziness., Disp: 30 tablet, Rfl: 0 .  metoprolol succinate (TOPROL-XL) 100 MG 24 hr tablet, Take 1 tablet (100 mg total) by mouth daily. Take with or immediately following a meal., Disp: 30 tablet, Rfl: 0 .  mirtazapine (REMERON) 15 MG tablet, take 1 tablet by mouth at bedtime for APPETITIE, Disp: 30 tablet, Rfl: 0 .  montelukast (SINGULAIR) 10 MG tablet, Take 1 tablet (10 mg total) by mouth at bedtime as needed., Disp: 30 tablet, Rfl: 0 .  OLANZapine (ZYPREXA) 10 MG tablet, Take 1 tablet (10 mg total) by mouth at bedtime., Disp: 30 tablet, Rfl: 0 .  omeprazole (PRILOSEC) 40 MG capsule, Take 1 capsule (40 mg total) by mouth 2 (two) times daily., Disp: 60 capsule, Rfl: 0 .  ondansetron (ZOFRAN) 8  MG tablet, Take 1 tablet (8 mg total) by mouth 2 (two) times daily as needed for refractory nausea / vomiting. Start on day 3 after carboplatin chemo., Disp: 30 tablet, Rfl: 0 .  potassium chloride SA (K-DUR) 20 MEQ tablet, Take 1 tablet (20 mEq total) by mouth 2 (two) times daily., Disp: 28 tablet, Rfl: 0 .  terazosin (HYTRIN) 1 MG capsule, Take 1 capsule (1 mg total) by mouth at bedtime., Disp: 30 capsule, Rfl: 0 .  triamcinolone cream (KENALOG) 0.1 %, Apply 1 application topically 2 (two) times daily as needed (itching)., Disp: 15 g, Rfl: 0 .  BREO ELLIPTA 200-25 MCG/INH AEPB, Inhale 1 puff into the lungs daily. (Patient not taking: Reported on 07/09/2019), Disp: 60 each, Rfl: 0 .  clonazePAM (KLONOPIN) 0.5 MG tablet, Take 1 tablet (0.5 mg total) by mouth 3 (three) times daily. (Patient not taking: Reported on 07/09/2019), Disp: 30 tablet, Rfl: 0 .  cloNIDine (CATAPRES) 0.2 MG tablet, Take 1 tablet (0.2 mg total) by mouth 2 (two) times daily. (Patient not taking: Reported on 07/09/2019), Disp: 60 tablet, Rfl: 0 .   hydrALAZINE (APRESOLINE) 100 MG tablet, Take 1 tablet (100 mg total) by mouth 3 (three) times daily. (Patient not taking: Reported on 07/09/2019), Disp: 90 tablet, Rfl: 0 .  loperamide (IMODIUM) 2 MG capsule, Take 2 capsules (4 mg total) by mouth 3 (three) times daily. (Patient not taking: Reported on 07/09/2019), Disp: 180 capsule, Rfl: 3 .  methocarbamol (ROBAXIN) 500 MG tablet, Take 1 tablet (500 mg total) by mouth at bedtime as needed for muscle spasms. (Patient not taking: Reported on 07/09/2019), Disp: 30 tablet, Rfl: 0 .  oxybutynin (DITROPAN) 5 MG tablet, Take 1 tablet (5 mg total) by mouth 2 (two) times daily. (Patient not taking: Reported on 07/09/2019), Disp: 60 tablet, Rfl: 0 .  prochlorperazine (COMPAZINE) 10 MG tablet, Take 1 tablet (10 mg total) by mouth every 6 (six) hours as needed (Nausea or vomiting). (Patient not taking: Reported on 07/09/2019), Disp: 30 tablet, Rfl: 1 .  sertraline (ZOLOFT) 100 MG tablet, Take 2 tablets (200 mg total) by mouth at bedtime., Disp: 60 tablet, Rfl: 0 .  tiZANidine (ZANAFLEX) 2 MG tablet, Take 1 tablet (2 mg total) by mouth every 6 (six) hours as needed for muscle spasms. (Patient not taking: Reported on 07/09/2019), Disp: 30 tablet, Rfl: 0 .  traZODone (DESYREL) 50 MG tablet, Take 0.5-1 tablets (25-50 mg total) by mouth at bedtime as needed for sleep. (Patient not taking: Reported on 07/09/2019), Disp: 30 tablet, Rfl: 0 No current facility-administered medications for this visit.   Facility-Administered Medications Ordered in Other Visits:  .  sodium chloride flush (NS) 0.9 % injection 10 mL, 10 mL, Intravenous, PRN, Verlon Au, NP, 10 mL at 04/13/19 1430  Physical exam:  Vitals:   07/09/19 0931  BP: (!) 161/90  Pulse: 93  Resp: 16  Temp: (!) 97.2 F (36.2 C)  TempSrc: Tympanic  Weight: 152 lb 12.8 oz (69.3 kg)   Physical Exam Constitutional:      General: She is not in acute distress. HENT:     Head: Normocephalic and atraumatic.  Eyes:      Pupils: Pupils are equal, round, and reactive to light.  Neck:     Musculoskeletal: Normal range of motion.  Cardiovascular:     Rate and Rhythm: Normal rate and regular rhythm.     Heart sounds: Normal heart sounds.  Pulmonary:     Effort: Pulmonary effort is  normal.     Breath sounds: Normal breath sounds.  Abdominal:     General: Bowel sounds are normal.     Palpations: Abdomen is soft.     Comments: Colostomy in place with multiple tapes around the colostomy  Skin:    General: Skin is warm and dry.  Neurological:     Mental Status: She is alert and oriented to person, place, and time.      CMP Latest Ref Rng & Units 07/09/2019  Glucose 70 - 99 mg/dL 185(H)  BUN 8 - 23 mg/dL 10  Creatinine 0.44 - 1.00 mg/dL 0.64  Sodium 135 - 145 mmol/L 140  Potassium 3.5 - 5.1 mmol/L 3.7  Chloride 98 - 111 mmol/L 101  CO2 22 - 32 mmol/L 26  Calcium 8.9 - 10.3 mg/dL 9.8  Total Protein 6.5 - 8.1 g/dL 7.9  Total Bilirubin 0.3 - 1.2 mg/dL 0.3  Alkaline Phos 38 - 126 U/L 102  AST 15 - 41 U/L 23  ALT 0 - 44 U/L 14   CBC Latest Ref Rng & Units 07/09/2019  WBC 4.0 - 10.5 K/uL 5.3  Hemoglobin 12.0 - 15.0 g/dL 12.0  Hematocrit 36.0 - 46.0 % 37.4  Platelets 150 - 400 K/uL 218      Assessment and plan- Patient is a 73 y.o. female with h/o stage III colon cancer s/p rescetion and now with limited stage small cell lung cancer status post 4 cycles of carboplatin and etoposide. She is here for routine f/u  1. Labs have improved significantly. Hb has improved from 10 to 12. Hypokalemia has resolved. Physically she feels stronger. No need for IV fluids today  2. She will need repeat ct chest abdomen pekvis with contrast 2 months from now for surveillance imaging for small cell lung cancer. I will also obtain MRI brain for surveillance imaging at that time  3. Colon cancer- she will be undergoing surveillance colonocopy next week and following that will have a colostomy take down by Dr. Dahlia Byes.  He can also take her port out at that time. She is coming back for wound care visit tomorrow given issues with her colostomy bag   I will see her back in 3 months with scans and labs prior   Visit Diagnosis 1. Malignant neoplasm of sigmoid colon ([place])   2. Small cell lung cancer ([place])      Dr. Randa Evens, MD, MPH [facility] [phone] 07/09/2019 11:36 AM

## 2019-07-10 ENCOUNTER — Other Ambulatory Visit
Admission: RE | Admit: 2019-07-10 | Discharge: 2019-07-10 | Disposition: A | Discharge status: Home / Self Care | Payer: Medicare Other | Source: Ambulatory Visit | Attending: Gastroenterology | Admitting: Gastroenterology

## 2019-07-10 DIAGNOSIS — Z85038 Personal history of other malignant neoplasm of large intestine: Secondary | ICD-10-CM | POA: Diagnosis not present

## 2019-07-10 DIAGNOSIS — M5030 Other cervical disc degeneration, unspecified cervical region: Secondary | ICD-10-CM | POA: Diagnosis not present

## 2019-07-10 DIAGNOSIS — Z20828 Contact with and (suspected) exposure to other viral communicable diseases: Secondary | ICD-10-CM | POA: Insufficient documentation

## 2019-07-10 DIAGNOSIS — M8580 Other specified disorders of bone density and structure, unspecified site: Secondary | ICD-10-CM | POA: Diagnosis not present

## 2019-07-10 DIAGNOSIS — Z01812 Encounter for preprocedural laboratory examination: Secondary | ICD-10-CM | POA: Diagnosis not present

## 2019-07-10 DIAGNOSIS — K219 Gastro-esophageal reflux disease without esophagitis: Secondary | ICD-10-CM | POA: Diagnosis not present

## 2019-07-10 DIAGNOSIS — Z85118 Personal history of other malignant neoplasm of bronchus and lung: Secondary | ICD-10-CM | POA: Diagnosis not present

## 2019-07-10 DIAGNOSIS — M5136 Other intervertebral disc degeneration, lumbar region: Secondary | ICD-10-CM | POA: Diagnosis not present

## 2019-07-10 DIAGNOSIS — M459 Ankylosing spondylitis of unspecified sites in spine: Secondary | ICD-10-CM | POA: Diagnosis not present

## 2019-07-10 DIAGNOSIS — Z433 Encounter for attention to colostomy: Secondary | ICD-10-CM | POA: Diagnosis not present

## 2019-07-10 DIAGNOSIS — I129 Hypertensive chronic kidney disease with stage 1 through stage 4 chronic kidney disease, or unspecified chronic kidney disease: Secondary | ICD-10-CM | POA: Diagnosis not present

## 2019-07-10 DIAGNOSIS — N189 Chronic kidney disease, unspecified: Secondary | ICD-10-CM | POA: Diagnosis not present

## 2019-07-10 DIAGNOSIS — M19011 Primary osteoarthritis, right shoulder: Secondary | ICD-10-CM | POA: Diagnosis not present

## 2019-07-10 DIAGNOSIS — M4802 Spinal stenosis, cervical region: Secondary | ICD-10-CM | POA: Diagnosis not present

## 2019-07-10 LAB — CEA: CEA: 3.1 ng/mL (ref 0.0–4.7)

## 2019-07-10 LAB — SARS CORONAVIRUS 2 (TAT 6-24 HRS): SARS Coronavirus 2: NEGATIVE

## 2019-07-14 DIAGNOSIS — M4802 Spinal stenosis, cervical region: Secondary | ICD-10-CM | POA: Diagnosis not present

## 2019-07-14 DIAGNOSIS — K219 Gastro-esophageal reflux disease without esophagitis: Secondary | ICD-10-CM | POA: Diagnosis not present

## 2019-07-14 DIAGNOSIS — M459 Ankylosing spondylitis of unspecified sites in spine: Secondary | ICD-10-CM | POA: Diagnosis not present

## 2019-07-14 DIAGNOSIS — Z85118 Personal history of other malignant neoplasm of bronchus and lung: Secondary | ICD-10-CM | POA: Diagnosis not present

## 2019-07-14 DIAGNOSIS — M19011 Primary osteoarthritis, right shoulder: Secondary | ICD-10-CM | POA: Diagnosis not present

## 2019-07-14 DIAGNOSIS — Z433 Encounter for attention to colostomy: Secondary | ICD-10-CM | POA: Diagnosis not present

## 2019-07-14 DIAGNOSIS — M5030 Other cervical disc degeneration, unspecified cervical region: Secondary | ICD-10-CM | POA: Diagnosis not present

## 2019-07-14 DIAGNOSIS — Z85038 Personal history of other malignant neoplasm of large intestine: Secondary | ICD-10-CM | POA: Diagnosis not present

## 2019-07-14 DIAGNOSIS — M8580 Other specified disorders of bone density and structure, unspecified site: Secondary | ICD-10-CM | POA: Diagnosis not present

## 2019-07-14 DIAGNOSIS — I129 Hypertensive chronic kidney disease with stage 1 through stage 4 chronic kidney disease, or unspecified chronic kidney disease: Secondary | ICD-10-CM | POA: Diagnosis not present

## 2019-07-14 DIAGNOSIS — M5136 Other intervertebral disc degeneration, lumbar region: Secondary | ICD-10-CM | POA: Diagnosis not present

## 2019-07-14 DIAGNOSIS — N189 Chronic kidney disease, unspecified: Secondary | ICD-10-CM | POA: Diagnosis not present

## 2019-07-14 MED ORDER — SODIUM CHLORIDE 0.9 % IV SOLN
2.0000 g | INTRAVENOUS | Status: DC
  Filled 2019-07-14: qty 2

## 2019-07-15 ENCOUNTER — Encounter: Payer: Self-pay | Admitting: *Deleted

## 2019-07-15 ENCOUNTER — Ambulatory Visit: Payer: Medicare Other | Admitting: Anesthesiology

## 2019-07-15 ENCOUNTER — Encounter
Admission: RE | Disposition: A | Discharge status: Home / Self Care | Payer: Self-pay | Source: Home / Self Care | Attending: Gastroenterology

## 2019-07-15 ENCOUNTER — Ambulatory Visit
Admission: RE | Admit: 2019-07-15 | Discharge: 2019-07-15 | Disposition: A | Discharge status: Home / Self Care | Payer: Medicare Other | Attending: Gastroenterology | Admitting: Gastroenterology

## 2019-07-15 ENCOUNTER — Other Ambulatory Visit: Payer: Self-pay

## 2019-07-15 DIAGNOSIS — Z980 Intestinal bypass and anastomosis status: Secondary | ICD-10-CM | POA: Insufficient documentation

## 2019-07-15 DIAGNOSIS — F419 Anxiety disorder, unspecified: Secondary | ICD-10-CM | POA: Insufficient documentation

## 2019-07-15 DIAGNOSIS — Z85118 Personal history of other malignant neoplasm of bronchus and lung: Secondary | ICD-10-CM | POA: Insufficient documentation

## 2019-07-15 DIAGNOSIS — J449 Chronic obstructive pulmonary disease, unspecified: Secondary | ICD-10-CM | POA: Diagnosis not present

## 2019-07-15 DIAGNOSIS — K921 Melena: Secondary | ICD-10-CM | POA: Diagnosis not present

## 2019-07-15 DIAGNOSIS — I10 Essential (primary) hypertension: Secondary | ICD-10-CM | POA: Diagnosis not present

## 2019-07-15 DIAGNOSIS — Z9221 Personal history of antineoplastic chemotherapy: Secondary | ICD-10-CM | POA: Insufficient documentation

## 2019-07-15 DIAGNOSIS — Z87891 Personal history of nicotine dependence: Secondary | ICD-10-CM | POA: Diagnosis not present

## 2019-07-15 DIAGNOSIS — Z923 Personal history of irradiation: Secondary | ICD-10-CM | POA: Diagnosis not present

## 2019-07-15 DIAGNOSIS — F329 Major depressive disorder, single episode, unspecified: Secondary | ICD-10-CM | POA: Diagnosis not present

## 2019-07-15 DIAGNOSIS — Z432 Encounter for attention to ileostomy: Secondary | ICD-10-CM | POA: Diagnosis not present

## 2019-07-15 DIAGNOSIS — Z85038 Personal history of other malignant neoplasm of large intestine: Secondary | ICD-10-CM | POA: Diagnosis not present

## 2019-07-15 DIAGNOSIS — Z79899 Other long term (current) drug therapy: Secondary | ICD-10-CM | POA: Diagnosis not present

## 2019-07-15 DIAGNOSIS — Z9049 Acquired absence of other specified parts of digestive tract: Secondary | ICD-10-CM | POA: Insufficient documentation

## 2019-07-15 DIAGNOSIS — K219 Gastro-esophageal reflux disease without esophagitis: Secondary | ICD-10-CM | POA: Insufficient documentation

## 2019-07-15 DIAGNOSIS — Z9889 Other specified postprocedural states: Secondary | ICD-10-CM

## 2019-07-15 DIAGNOSIS — K529 Noninfective gastroenteritis and colitis, unspecified: Secondary | ICD-10-CM | POA: Insufficient documentation

## 2019-07-15 DIAGNOSIS — K6389 Other specified diseases of intestine: Secondary | ICD-10-CM | POA: Insufficient documentation

## 2019-07-15 DIAGNOSIS — Z08 Encounter for follow-up examination after completed treatment for malignant neoplasm: Secondary | ICD-10-CM | POA: Diagnosis not present

## 2019-07-15 DIAGNOSIS — C187 Malignant neoplasm of sigmoid colon: Secondary | ICD-10-CM | POA: Diagnosis not present

## 2019-07-15 HISTORY — PX: COLONOSCOPY WITH PROPOFOL: SHX5780

## 2019-07-15 SURGERY — COLONOSCOPY WITH PROPOFOL
Anesthesia: General

## 2019-07-15 MED ORDER — PROPOFOL 10 MG/ML IV BOLUS
INTRAVENOUS | Status: DC | PRN
  Administered 2019-07-15 (×7): 30 mg via INTRAVENOUS
  Administered 2019-07-15: 80 mg via INTRAVENOUS
  Administered 2019-07-15: 30 mg via INTRAVENOUS

## 2019-07-15 MED ORDER — LIDOCAINE HCL (CARDIAC) PF 100 MG/5ML IV SOSY
PREFILLED_SYRINGE | INTRAVENOUS | Status: DC | PRN
  Administered 2019-07-15: 80 mg via INTRAVENOUS

## 2019-07-15 MED ORDER — LIDOCAINE HCL (PF) 2 % IJ SOLN
INTRAMUSCULAR | Status: AC
  Filled 2019-07-15: qty 10

## 2019-07-15 MED ORDER — SODIUM CHLORIDE 0.9 % IV SOLN
INTRAVENOUS | Status: DC
  Administered 2019-07-15: 1000 mL via INTRAVENOUS

## 2019-07-15 MED ORDER — PROPOFOL 500 MG/50ML IV EMUL
INTRAVENOUS | Status: AC
  Filled 2019-07-15: qty 50

## 2019-07-15 MED ORDER — METOPROLOL TARTRATE 50 MG PO TABS
ORAL_TABLET | ORAL | Status: AC
  Filled 2019-07-15: qty 2

## 2019-07-15 MED ORDER — METOPROLOL SUCCINATE ER 25 MG PO TB24
ORAL_TABLET | ORAL | Status: AC
  Filled 2019-07-15: qty 1

## 2019-07-15 MED ORDER — METOPROLOL SUCCINATE ER 100 MG PO TB24
100.0000 mg | ORAL_TABLET | Freq: Once | ORAL | Status: AC
  Administered 2019-07-15: 100 mg via ORAL
  Filled 2019-07-15: qty 1

## 2019-07-15 NOTE — Anesthesia Post-op Follow-up Note (Signed)
Anesthesia QCDR form completed.        

## 2019-07-15 NOTE — Progress Notes (Signed)
C/o tongue hurting, asking "is it white". Oral care with oral care kit and pt. Says "feels better".

## 2019-07-15 NOTE — Transfer of Care (Signed)
Immediate Anesthesia Transfer of Care Note  Patient: Gloria Allen  Procedure(s) Performed: COLONOSCOPY WITH PROPOFOL (N/A )  Patient Location: Endoscopy Unit  Anesthesia Type:General  Level of Consciousness: drowsy  Airway & Oxygen Therapy: Patient Spontanous Breathing and Patient connected to nasal cannula oxygen  Post-op Assessment: Report given to RN and Post -op Vital signs reviewed and stable  Post vital signs: Reviewed and stable  Last Vitals:  Vitals Value Taken Time  BP 185/99 07/15/19 0938  Temp 36.5 C 07/15/19 0937  Pulse 78 07/15/19 0937  Resp 21 07/15/19 0939  SpO2    Vitals shown include unvalidated device data.  Last Pain:  Vitals:   07/15/19 0937  TempSrc: Tympanic  PainSc: 0-No pain         Complications: No apparent anesthesia complications

## 2019-07-15 NOTE — Anesthesia Postprocedure Evaluation (Signed)
Anesthesia Post Note  Patient: Gloria Allen  Procedure(s) Performed: COLONOSCOPY WITH PROPOFOL (N/A )  Patient location during evaluation: Endoscopy Anesthesia Type: General Level of consciousness: awake and alert Pain management: pain level controlled Vital Signs Assessment: post-procedure vital signs reviewed and stable Respiratory status: spontaneous breathing and respiratory function stable Cardiovascular status: stable Anesthetic complications: no     Last Vitals:  Vitals:   07/15/19 0829 07/15/19 0937  BP: (!) 162/113 (!) 185/99  Pulse: (!) 114 78  Resp: 18 20  Temp:  36.5 C  SpO2: 100% 100%    Last Pain:  Vitals:   07/15/19 0937  TempSrc: Tympanic  PainSc: 0-No pain                 Lalita Ebel K

## 2019-07-15 NOTE — Op Note (Signed)
[facility]enter[facility] Patient Name: Gloria Allen Procedure Date: 07/15/2019 9:09 AM MRN: [id number] Account #: [id number] Date of Birth: 05-Jan-1946 Admit Type: Outpatient Age: 73 Room: [facility] ENDO ROOM 2 Gender: Female Note Status: Finalized Procedure:            Colonoscopy Indications:          Personal history of malignant neoplasm of the colon Providers:            Lamyiah Crawshaw B. Bonna Gains MD, MD Medicines:            Monitored Anesthesia Care Complications:        No immediate complications. Procedure:            Pre-Anesthesia Assessment:                       - Prior to the procedure, a History and Physical was                        performed, and patient medications, allergies and                        sensitivities were reviewed. The patient's tolerance of                        previous anesthesia was reviewed.                       - The risks and benefits of the procedure and the                        sedation options and risks were discussed with the                        patient. All questions were answered and informed                        consent was obtained.                       - Patient identification and proposed procedure were                        verified prior to the procedure by the physician, the                        nurse, the anesthesiologist, the anesthetist and the                        technician. The procedure was verified in the                        pre-procedure area in the procedure room in the                        endoscopy suite.                       - Prophylactic Antibiotics: The patient does not                        require prophylactic antibiotics.                       -  ASA Grade Assessment: III - A patient with severe                        systemic disease.                       - After reviewing the risks and benefits, the patient                        was deemed in satisfactory condition to  undergo the                        procedure.                       - Monitored anesthesia care was determined to be                        medically necessary for this procedure based on review                        of the patient's medical history, medications, and                        prior anesthesia history.                       - The anesthesia plan was to use monitored anesthesia                        care (MAC).                       After obtaining informed consent, the colonoscope was                        passed under direct vision. Throughout the procedure,                        the patient's blood pressure, pulse, and oxygen                        saturations were monitored continuously. The                        Colonoscope was introduced through the anus and                        advanced to the the cecum, identified by appendiceal                        orifice and ileocecal valve. The colonoscopy was                        performed with ease. The patient tolerated the                        procedure well. The quality of the bowel preparation                        was poor. Findings:      The perianal and digital rectal examinations  were normal.      A patchy area of mildly erythematous mucosa was found at 30 cm proximal       to the anus. Biopsies were taken with a cold forceps for histology.      A tattoo was seen at 15 cm proximal to the anus. A scar was found at the       tattoo site.      There was evidence of a prior surgical anastomosis in the sigmoid colon.       This was patent and was characterized by a small opening vs closed       indentation. [Traversed].      Large mucus balls seen throughout the colon due to patient's diverted       anatomy. This affected visualization and small polyps or lesions cannot       be excluded.      The exam was otherwise without abnormality. Impression:           - Preparation of the colon was poor.                        - Erythematous mucosa at 30 cm proximal to the anus.                        Biopsied.                       - A tattoo was seen at 15 cm proximal to the anus. A                        scar was found at the tattoo site.                       - Patent surgical anastomosis.                       - Large mucus balls seen throughout the colon due to                        patient's diverted anatomy. This affected visualization                        and small polyps or lesions cannot be excluded.                       - The examination was otherwise normal. Recommendation:       - Repeat colonoscopy [day] within 6-12 months post                        ostomy reversal.                       - Await pathology results.                       - Return to my office in 3 months.                       - Dr. Dahlia Byes as scheduled                       - The findings and recommendations were discussed  with                        the patient.                       - Continue present medications. Procedure Code(s):    --- Professional ---                       [phone], Colonoscopy, flexible; with biopsy, single or                        multiple Diagnosis Code(s):    --- Professional ---                       K63.89, Other specified diseases of intestine                       Z98.0, Intestinal bypass and anastomosis status                       Z85.038, Personal history of other malignant neoplasm                        of large intestine CPT copyright 2019 American Medical Association. All rights reserved. The codes documented in this report are preliminary and upon coder review may  be revised to meet current compliance requirements.  Vonda Antigua, MD Margretta Sidle B. Bonna Gains MD, MD 07/15/2019 9:49:04 AM This report has been signed electronically. Number of Addenda: 0 Note Initiated On: 07/15/2019 9:09 AM Scope Withdrawal Time: 0 hours 7 minutes 45 seconds  Total Procedure Duration: 0 hours 21  minutes 40 seconds  Estimated Blood Loss: Estimated blood loss: none.      [facility]

## 2019-07-15 NOTE — Anesthesia Preprocedure Evaluation (Signed)
Anesthesia Evaluation  Patient identified by MRN, date of birth, ID band Patient awake    Reviewed: Allergy & Precautions, NPO status , Patient's Chart, lab work & pertinent test results, reviewed documented beta blocker date and time   History of Anesthesia Complications Negative for: history of anesthetic complications  Airway Mallampati: II       Dental   Pulmonary asthma , neg sleep apnea, neg COPD, former smoker,  Lung CA          Cardiovascular hypertension, Pt. on medications and Pt. on home beta blockers (-) Past MI and (-) CHF (-) dysrhythmias (-) Valvular Problems/Murmurs     Neuro/Psych neg Seizures Anxiety Depression    GI/Hepatic Neg liver ROS, GERD  Medicated and Controlled,  Endo/Other  neg diabetes  Renal/GU Renal InsufficiencyRenal disease     Musculoskeletal   Abdominal   Peds  Hematology   Anesthesia Other Findings   Reproductive/Obstetrics                             Anesthesia Physical Anesthesia Plan  ASA: III  Anesthesia Plan: General   Post-op Pain Management:    Induction: Intravenous  PONV Risk Score and Plan: 3 and TIVA, Propofol infusion and Ondansetron  Airway Management Planned: Nasal Cannula  Additional Equipment:   Intra-op Plan:   Post-operative Plan:   Informed Consent: I have reviewed the patients History and Physical, chart, labs and discussed the procedure including the risks, benefits and alternatives for the proposed anesthesia with the patient or authorized representative who has indicated his/her understanding and acceptance.       Plan Discussed with:   Anesthesia Plan Comments:         Anesthesia Quick Evaluation

## 2019-07-15 NOTE — H&P (Signed)
Vonda Antigua, MD [address] [place], [place], [place], [address] [id number], [place], [place], [place], [address] Phone: [phone]  Fax: [phone]  Primary Care Physician:  [facility]   Pre-Procedure History & Physical: HPI:  Gloria Allen is a 73 y.o. female is here for a colonoscopy.   Past Medical History:  Diagnosis Date  . Anxiety   . Arthritis   . Asthma   . Cervical central spinal stenosis (C3-C7) (worse at C4-5) 07/30/2017  . Cervical foraminal stenosis (C4-5 and C5-6) (Bilateral) (L>R) 07/30/2017  . Chronic lower extremity pain (Fourth Area of Pain) (Bilateral) (R>L) 06/11/2017  . Chronic neck pain (Primary Area of Pain) (Bilateral) (R>L) 06/11/2017  . Chronic sacroiliac joint pain (Bilateral) (R>L) 06/11/2017  . Chronic shoulder pain [facility] Area of Pain) (Right) 06/11/2017  . Chronic upper extremity pain (Fifth Area of Pain) (Bilateral) (R>L) 06/11/2017  . Colon cancer ([place]) 11/2018   Partial colon resection  . DDD (degenerative disc disease), cervical 07/30/2017  . DDD (degenerative disc disease), lumbar 07/30/2017  . Depression   . DISH (diffuse idiopathic skeletal hyperostosis) 07/30/2017  . Dyspnea    with exertion  . Entrapment syndrome 06/20/2017   2002 on R and 2010 on L  . Full thickness rotator cuff tear 06/12/2017  . GERD (gastroesophageal reflux disease)   . Grade 1 Anterolisthesis of L4 over L5 07/30/2017  . Headache   . Hx: UTI (urinary tract infection)   . Hypertension   . Inflammation of joint of shoulder region 01/15/2017  . Lumbar central spinal stenosis (L4-5) 07/30/2017  . Lumbar disc protrusion (Left: L5-S1) (Right: L1-2) 07/30/2017   L5-S1 left foraminal protrusion with L5 impingement. L1-2 right paracentral protrusion without impingement.  . Lumbar facet arthropathy (Bilateral) 07/30/2017  . Lumbar facet syndrome (Bilateral) (R>L) 07/30/2017  . Lung cancer ([place]) 11/2018   Chemo  and rad tx's  .  Osteoarthritis of shoulder (Right) 01/15/2017    Past Surgical History:  Procedure Laterality Date  . ABDOMINAL HYSTERECTOMY    . APPENDECTOMY    . BREAST REDUCTION SURGERY Bilateral   . COLONOSCOPY WITH PROPOFOL N/A 11/05/2018   Procedure: COLONOSCOPY WITH PROPOFOL;  Surgeon: Virgel Manifold, MD;  Location: [facility] ENDOSCOPY;  Service: Endoscopy;  Laterality: N/A;  . COLONOSCOPY WITH PROPOFOL N/A 11/06/2018   Procedure: COLONOSCOPY WITH PROPOFOL;  Surgeon: Virgel Manifold, MD;  Location: [facility] ENDOSCOPY;  Service: Endoscopy;  Laterality: N/A;  . COLOSTOMY    . DILATION AND CURETTAGE OF UTERUS    . ESOPHAGOGASTRODUODENOSCOPY (EGD) WITH PROPOFOL N/A 11/05/2018   Procedure: ESOPHAGOGASTRODUODENOSCOPY (EGD) WITH PROPOFOL;  Surgeon: Virgel Manifold, MD;  Location: [facility] ENDOSCOPY;  Service: Endoscopy;  Laterality: N/A;  . ESOPHAGOGASTRODUODENOSCOPY (EGD) WITH PROPOFOL N/A 03/27/2019   Procedure: ESOPHAGOGASTRODUODENOSCOPY (EGD) WITH PROPOFOL;  Surgeon: Lucilla Lame, MD;  Location: [facility] ENDOSCOPY;  Service: Endoscopy;  Laterality: N/A;  . ILEOSTOMY Right 11/27/2018   Procedure: ILEOSTOMY;  Surgeon: Jules Husbands, MD;  Location: [facility] ORS;  Service: General;  Laterality: Right;  . LAPAROSCOPIC SIGMOID COLECTOMY N/A 11/27/2018   Procedure: LAPAROSCOPIC SIGMOID COLECTOMY;  Surgeon: Jules Husbands, MD;  Location: [facility] ORS;  Service: General;  Laterality: N/A;  . ORIF ANKLE FRACTURE Right 11/21/2018   Procedure: OPEN REDUCTION INTERNAL FIXATION (ORIF) ANKLE FRACTURE;  Surgeon: Corky Mull, MD;  Location: [facility] ORS;  Service: Orthopedics;  Laterality: Right;  . PORTACATH PLACEMENT N/A 03/12/2019   Procedure: INSERTION PORT-A-CATH;  Surgeon: Jules Husbands,  MD;  Location: [facility] ORS;  Service: General;  Laterality: N/A;  . REDUCTION MAMMAPLASTY    . SHOULDER ARTHROSCOPY WITH OPEN ROTATOR CUFF REPAIR AND DISTAL CLAVICLE ACROMINECTOMY Right 03/26/2017   Procedure: right shoulder arthroscopy, arthroscopic  subacromial decompression, distal clavicle excision, mini open rotator cuff repair;  Surgeon: Thornton Park, MD;  Location: [facility] ORS;  Service: Orthopedics;  Laterality: Right;    Prior to Admission medications   Medication Sig Start Date End Date Taking? Authorizing Provider  amLODipine (NORVASC) 10 MG tablet Take 1 tablet (10 mg total) by mouth daily. 03/27/19  Yes Wieting, Richard, MD  hydrOXYzine (ATARAX/VISTARIL) 10 MG tablet Take 1 tablet (10 mg total) by mouth 3 (three) times daily as needed for itching. 03/27/19  Yes Wieting, Richard, MD  lovastatin (MEVACOR) 40 MG tablet Take 1 tablet (40 mg total) by mouth at bedtime. 03/27/19  Yes Wieting, Richard, MD  meclizine (ANTIVERT) 12.5 MG tablet Take 1 tablet (12.5 mg total) by mouth 3 (three) times daily as needed for dizziness. 03/27/19  Yes Wieting, Richard, MD  metoprolol succinate (TOPROL-XL) 100 MG 24 hr tablet Take 1 tablet (100 mg total) by mouth daily. Take with or immediately following a meal. 03/27/19  Yes Wieting, Richard, MD  montelukast (SINGULAIR) 10 MG tablet Take 1 tablet (10 mg total) by mouth at bedtime as needed. 03/27/19  Yes Wieting, Richard, MD  OLANZapine (ZYPREXA) 10 MG tablet Take 1 tablet (10 mg total) by mouth at bedtime. 07/09/19  Yes Sindy Guadeloupe, MD  omeprazole (PRILOSEC) 40 MG capsule Take 1 capsule (40 mg total) by mouth 2 (two) times daily. 03/27/19  Yes Wieting, Richard, MD  ondansetron (ZOFRAN) 8 MG tablet Take 1 tablet (8 mg total) by mouth every 8 (eight) hours as needed for nausea. Start on day 3 after carboplatin chemo. 07/09/19  Yes Sindy Guadeloupe, MD  potassium chloride SA (K-DUR) 20 MEQ tablet Take 1 tablet (20 mEq total) by mouth 2 (two) times daily. 06/25/19  Yes Sindy Guadeloupe, MD  sertraline (ZOLOFT) 100 MG tablet Take 2 tablets (200 mg total) by mouth at bedtime. 03/27/19  Yes Wieting, Richard, MD  terazosin (HYTRIN) 1 MG capsule Take 1 capsule (1 mg total) by mouth at bedtime. 03/27/19  Yes Loletha Grayer, MD  albuterol (VENTOLIN HFA) 108 (90 Base) MCG/ACT inhaler Inhale 2 puffs into the lungs every 6 (six) hours as needed for wheezing or shortness of breath. 03/27/19   Loletha Grayer, MD  benzonatate (TESSALON) 200 MG capsule Take 1 capsule (200 mg total) by mouth 3 (three) times daily as needed for cough. 04/17/19   Sindy Guadeloupe, MD  BREO ELLIPTA 200-25 MCG/INH AEPB Inhale 1 puff into the lungs daily. Patient not taking: Reported on 07/09/2019 03/27/19   Loletha Grayer, MD  clonazePAM (KLONOPIN) 0.5 MG tablet Take 1 tablet (0.5 mg total) by mouth 3 (three) times daily. Patient not taking: Reported on 07/09/2019 03/27/19   Loletha Grayer, MD  cloNIDine (CATAPRES) 0.2 MG tablet Take 1 tablet (0.2 mg total) by mouth 2 (two) times daily. Patient not taking: Reported on 07/09/2019 03/27/19   Loletha Grayer, MD  hydrALAZINE (APRESOLINE) 100 MG tablet Take 1 tablet (100 mg total) by mouth 3 (three) times daily. Patient not taking: Reported on 07/09/2019 03/27/19   Loletha Grayer, MD  lidocaine-prilocaine (EMLA) cream Apply 1 application topically as directed. Place small amt over port site 1 hour before coming in cancer center for port flush 07/09/19   Randa Evens  C, MD  loperamide (IMODIUM) 2 MG capsule Take 2 capsules (4 mg total) by mouth 3 (three) times daily. Patient not taking: Reported on 07/09/2019 05/27/19   Caroleen Hamman F, MD  methocarbamol (ROBAXIN) 500 MG tablet Take 1 tablet (500 mg total) by mouth at bedtime as needed for muscle spasms. Patient not taking: Reported on 07/09/2019 03/27/19   Loletha Grayer, MD  mirtazapine (REMERON) 15 MG tablet take 1 tablet by mouth at bedtime for APPETITIE 03/27/19   Loletha Grayer, MD  oxybutynin (DITROPAN) 5 MG tablet Take 1 tablet (5 mg total) by mouth 2 (two) times daily. Patient not taking: Reported on 07/09/2019 03/27/19   Loletha Grayer, MD  prochlorperazine (COMPAZINE) 10 MG tablet Take 1 tablet (10 mg total) by mouth every 6 (six) hours  as needed (Nausea or vomiting). Patient not taking: Reported on 07/09/2019 05/08/19   Sindy Guadeloupe, MD  tiZANidine (ZANAFLEX) 2 MG tablet Take 1 tablet (2 mg total) by mouth every 6 (six) hours as needed for muscle spasms. Patient not taking: Reported on 07/09/2019 04/08/19   Caroleen Hamman F, MD  traZODone (DESYREL) 50 MG tablet Take 0.5-1 tablets (25-50 mg total) by mouth at bedtime as needed for sleep. Patient not taking: Reported on 07/09/2019 03/27/19   Loletha Grayer, MD  triamcinolone cream (KENALOG) 0.1 % Apply 1 application topically 2 (two) times daily as needed (itching). 03/27/19   Loletha Grayer, MD    Allergies as of 06/17/2019 - Review Complete 06/16/2019  Allergen Reaction Noted  . Ace inhibitors Swelling 06/20/2017  . Angiotensin receptor blockers Swelling 06/20/2017  . Sulfa antibiotics Itching 08/20/2018  . Chlorthalidone Rash 04/22/2018  . Flexeril [cyclobenzaprine] Rash 04/22/2018    Family History  Problem Relation Age of Onset  . Hypertension Mother   . Breast cancer Mother 33  . Breast cancer Sister 51    Social History   Socioeconomic History  . Marital status: Divorced    Spouse name: Not on file  . Number of children: 3  . Years of education: Not on file  . Highest education level: 11th grade  Occupational History  . Not on file  Social Needs  . Financial resource strain: Somewhat hard  . Food insecurity    Worry: Sometimes true    Inability: Never true  . Transportation needs    Medical: Yes    Non-medical: No  Tobacco Use  . Smoking status: Former Smoker    Packs/day: 1.00    Types: Cigarettes    Quit date: 2010    Years since quitting: 10.7  . Smokeless tobacco: Never Used  . Tobacco comment: quit over 30 years ago   Substance and Sexual Activity  . Alcohol use: No  . Drug use: Yes    Types: Marijuana  . Sexual activity: Never  Lifestyle  . Physical activity    Days per week: 0 days    Minutes per session: 0 min  . Stress: Not at  all  Relationships  . Social Herbalist on phone: Never    Gets together: Never    Attends religious service: More than 4 times per year    Active member of club or organization: No    Attends meetings of clubs or organizations: Never    Relationship status: Divorced  . Intimate partner violence    Fear of current or ex partner: No    Emotionally abused: No    Physically abused: No    Forced sexual  activity: No  Other Topics Concern  . 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 She receives disability. She has 3 children and several grandchildren. She lives alone. She drives. Uses a walker and shower chair at baseline.     Review of Systems: See HPI, otherwise negative ROS  Physical Exam: BP (!) 162/113   Pulse (!) 114   Temp (!) 96.9 F (36.1 C) (Tympanic)   Resp 18   Ht 5\' 5"  (1.651 m)   Wt 68.9 kg   SpO2 100%   BMI 25.29 kg/m  General:   Alert,  pleasant and cooperative in NAD Head:  Normocephalic and atraumatic. Neck:  Supple; no masses or thyromegaly. Lungs:  Clear throughout to auscultation, normal respiratory effort.    Heart:  +S1, +S2, Regular rate and rhythm, No edema. Abdomen:  Soft, nontender and nondistended. Normal bowel sounds, without guarding, and without rebound.   Neurologic:  Alert and  oriented x4;  grossly normal neurologically.  Impression/Plan: [name] is here for a colonoscopy to be performed for history of colon cancer  Risks, benefits, limitations, and alternatives regarding  colonoscopy have been reviewed with the patient.  Questions have been answered.  All parties agreeable.   Virgel Manifold, MD  07/15/2019, 8:54 AM

## 2019-07-16 ENCOUNTER — Encounter: Payer: Self-pay | Admitting: Gastroenterology

## 2019-07-16 LAB — SURGICAL PATHOLOGY

## 2019-07-20 DIAGNOSIS — M19011 Primary osteoarthritis, right shoulder: Secondary | ICD-10-CM | POA: Diagnosis not present

## 2019-07-20 DIAGNOSIS — K219 Gastro-esophageal reflux disease without esophagitis: Secondary | ICD-10-CM | POA: Diagnosis not present

## 2019-07-20 DIAGNOSIS — M5030 Other cervical disc degeneration, unspecified cervical region: Secondary | ICD-10-CM | POA: Diagnosis not present

## 2019-07-20 DIAGNOSIS — M8580 Other specified disorders of bone density and structure, unspecified site: Secondary | ICD-10-CM | POA: Diagnosis not present

## 2019-07-20 DIAGNOSIS — Z433 Encounter for attention to colostomy: Secondary | ICD-10-CM | POA: Diagnosis not present

## 2019-07-20 DIAGNOSIS — Z85038 Personal history of other malignant neoplasm of large intestine: Secondary | ICD-10-CM | POA: Diagnosis not present

## 2019-07-20 DIAGNOSIS — N189 Chronic kidney disease, unspecified: Secondary | ICD-10-CM | POA: Diagnosis not present

## 2019-07-20 DIAGNOSIS — M5136 Other intervertebral disc degeneration, lumbar region: Secondary | ICD-10-CM | POA: Diagnosis not present

## 2019-07-20 DIAGNOSIS — Z85118 Personal history of other malignant neoplasm of bronchus and lung: Secondary | ICD-10-CM | POA: Diagnosis not present

## 2019-07-20 DIAGNOSIS — I129 Hypertensive chronic kidney disease with stage 1 through stage 4 chronic kidney disease, or unspecified chronic kidney disease: Secondary | ICD-10-CM | POA: Diagnosis not present

## 2019-07-20 DIAGNOSIS — M459 Ankylosing spondylitis of unspecified sites in spine: Secondary | ICD-10-CM | POA: Diagnosis not present

## 2019-07-20 DIAGNOSIS — M4802 Spinal stenosis, cervical region: Secondary | ICD-10-CM | POA: Diagnosis not present

## 2019-07-21 ENCOUNTER — Telehealth: Payer: Self-pay | Admitting: Nurse Practitioner

## 2019-07-21 NOTE — Telephone Encounter (Signed)
I called Gloria Allen for Scheduled palliative care telemedince f/u visit, no answer, message left with contact informaton

## 2019-07-22 ENCOUNTER — Encounter: Payer: Self-pay | Admitting: Surgery

## 2019-07-22 ENCOUNTER — Other Ambulatory Visit: Payer: Self-pay

## 2019-07-22 ENCOUNTER — Ambulatory Visit (INDEPENDENT_AMBULATORY_CARE_PROVIDER_SITE_OTHER): Payer: Medicare Other | Admitting: Surgery

## 2019-07-22 VITALS — BP 171/99 | HR 90 | Temp 97.2°F | Resp 14 | Ht 65.0 in | Wt 153.4 lb

## 2019-07-22 DIAGNOSIS — C187 Malignant neoplasm of sigmoid colon: Secondary | ICD-10-CM

## 2019-07-22 MED ORDER — LORATADINE 10 MG PO TABS: 10.0000 mg | ORAL_TABLET | Freq: Every day | ORAL | 0 refills | Status: DC

## 2019-07-22 NOTE — Patient Instructions (Addendum)
No bowel prep needed per Dr.Pabon.   Please stop by the pharmacy to pick up your medication.   Our surgery scheduler will call you within 24-48 hours to schedule your surgery. Please have the [facility] surgery sheet available when speaking with her.

## 2019-07-22 NOTE — H&P (View-Only) (Signed)
Outpatient Surgical Follow Up  07/22/2019  Gloria Allen is an 73 y.o. female.   Chief Complaint  Patient presents with  . 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  She has already completed radiation and chemotherapy.  She did have repeat colonoscopy that I have personally reviewed showing no evidence of concerning lesions.  There was some areas that were biopsy showing no evidence of cancer.  The anastomosis was widely patent. She is feeling stronger.  She does complain of some chronic itching and chronic back pain.  I have personally reviewed her CBC and CMP that is completely normal. She also had a CT scan last month that I have personally reviewed showing no evidence of recurrence within the pelvic area no evidence of intra-abdominal metastatic disease  Past Medical History:  Diagnosis Date  . Anxiety   . Arthritis   . Asthma   . Cervical central spinal stenosis (C3-C7) (worse at C4-5) 07/30/2017  . Cervical foraminal stenosis (C4-5 and C5-6) (Bilateral) (L>R) 07/30/2017  . Chronic lower extremity pain (Fourth Area of Pain) (Bilateral) (R>L) 06/11/2017  . Chronic neck pain (Primary Area of Pain) (Bilateral) (R>L) 06/11/2017  . Chronic sacroiliac joint pain (Bilateral) (R>L) 06/11/2017  . Chronic shoulder pain [facility] Area of Pain) (Right) 06/11/2017  . Chronic upper extremity pain (Fifth Area of Pain) (Bilateral) (R>L) 06/11/2017  . Colon cancer ([place]) 11/2018   Partial colon resection  . DDD (degenerative disc disease), cervical 07/30/2017  . DDD (degenerative disc disease), lumbar 07/30/2017  . Depression   . DISH (diffuse idiopathic skeletal hyperostosis) 07/30/2017  . Dyspnea    with exertion  . Entrapment syndrome 06/20/2017   2002 on R and 2010 on L  . Full thickness rotator cuff tear 06/12/2017  . GERD  (gastroesophageal reflux disease)   . Grade 1 Anterolisthesis of L4 over L5 07/30/2017  . Headache   . Hx: UTI (urinary tract infection)   . Hypertension   . Inflammation of joint of shoulder region 01/15/2017  . Lumbar central spinal stenosis (L4-5) 07/30/2017  . Lumbar disc protrusion (Left: L5-S1) (Right: L1-2) 07/30/2017   L5-S1 left foraminal protrusion with L5 impingement. L1-2 right paracentral protrusion without impingement.  . Lumbar facet arthropathy (Bilateral) 07/30/2017  . Lumbar facet syndrome (Bilateral) (R>L) 07/30/2017  . Lung cancer ([place]) 11/2018   Chemo  and rad tx's  . Osteoarthritis of shoulder (Right) 01/15/2017    Past Surgical History:  Procedure Laterality Date  . ABDOMINAL HYSTERECTOMY    . APPENDECTOMY    . BREAST REDUCTION SURGERY Bilateral   . COLONOSCOPY WITH PROPOFOL N/A 11/05/2018   Procedure: COLONOSCOPY WITH PROPOFOL;  Surgeon: Virgel Manifold, MD;  Location: [facility] ENDOSCOPY;  Service: Endoscopy;  Laterality: N/A;  . COLONOSCOPY WITH PROPOFOL N/A 11/06/2018   Procedure: COLONOSCOPY WITH PROPOFOL;  Surgeon: Virgel Manifold, MD;  Location: [facility] END[facility];  Service: Endoscopy;  Laterality: N/A;  . COLONOSCOPY WITH PROPOFOL N/A 07/15/2019   Procedure: COLONOSCOPY WITH PROPOFOL;  Surgeon: Virgel Manifold, MD;  Location: [facility] ENDOSCOPY;  Service: Endoscopy;  Laterality: N/A;  . COLOSTOMY    . DILATION AND CURETTAGE OF UTERUS    . ESOPHAGOGASTRODUODENOSCOPY (EGD) WITH PROPOFOL N/A 11/05/2018   Procedure: ESOPHAGOGASTRODUODENOSCOPY (EGD) WITH PROPOFOL;  Surgeon: Virgel Manifold, MD;  Location: [facility];  Service: Endoscopy;  Laterality:  N/A;  . ESOPHAGOGASTRODUODENOSCOPY (EGD) WITH PROPOFOL N/A 03/27/2019   Procedure: ESOPHAGOGASTRODUODENOSCOPY (EGD) WITH PROPOFOL;  Surgeon: Lucilla Lame, MD;  Location: [facility] ENDOSCOPY;  Service: Endoscopy;  Laterality: N/A;  . ILEOSTOMY Right 11/27/2018   Procedure: ILEOSTOMY;  Surgeon: Jules Husbands, MD;   Location: [facility];  Service: General;  Laterality: Right;  . LAPAROSCOPIC SIGMOID COLECTOMY N/A 11/27/2018   Procedure: LAPAROSCOPIC SIGMOID COLECTOMY;  Surgeon: Jules Husbands, MD;  Location: [facility];  Service: General;  Laterality: N/A;  . ORIF ANKLE FRACTURE Right 11/21/2018   Procedure: OPEN REDUCTION INTERNAL FIXATION (ORIF) ANKLE FRACTURE;  Surgeon: Corky Mull, MD;  Location: [facility];  Service: Orthopedics;  Laterality: Right;  . PORTACATH PLACEMENT N/A 03/12/2019   Procedure: INSERTION PORT-A-CATH;  Surgeon: Jules Husbands, MD;  Location: [facility];  Service: General;  Laterality: N/A;  . REDUCTION MAMMAPLASTY    . SHOULDER ARTHROSCOPY WITH OPEN ROTATOR CUFF REPAIR AND DISTAL CLAVICLE ACROMINECTOMY Right 03/26/2017   Procedure: right shoulder arthroscopy, arthroscopic subacromial decompression, distal clavicle excision, mini open rotator cuff repair;  Surgeon: Thornton Park, MD;  Location: [facility] ORS;  Service: Orthopedics;  Laterality: Right;    Family History  Problem Relation Age of Onset  . Hypertension Mother   . Breast cancer Mother 34  . Breast cancer Sister 73    Social History:  reports that she quit smoking about 10 years ago. Her smoking use included cigarettes. She smoked 1.00 pack per day. She has never used smokeless tobacco. She reports current drug use. Drug: Marijuana. She reports that she does not drink alcohol.  Allergies:  Allergies  Allergen Reactions  . Ace Inhibitors Swelling  . Angiotensin Receptor Blockers Swelling  . Sulfa Antibiotics Itching  . Chlorthalidone Rash  . Flexeril [Cyclobenzaprine] Rash    Medications reviewed.    ROS Full ROS performed and is otherwise negative other than what is stated in HPI   BP (!) 171/99   Pulse 90   Temp (!) 97.2 F (36.2 C) (Temporal)   Resp 14   Ht 5\' 5"  (1.651 m)   Wt 153 lb 6.4 oz (69.6 kg)   SpO2 99%   BMI 25.53 kg/m   Physical Exam Vitals signs and nursing note reviewed. Exam conducted  with a chaperone present.  Constitutional:      General: She is not in acute distress.    Appearance: Normal appearance. She is normal weight.  Eyes:     General:        Right eye: No discharge.        Left eye: No discharge.  Neck:     Musculoskeletal: Normal range of motion.  Cardiovascular:     Rate and Rhythm: Normal rate and regular rhythm.  Pulmonary:     Effort: Pulmonary effort is normal. No respiratory distress.  Abdominal:     General: Abdomen is flat. There is no distension.     Palpations: Abdomen is soft. There is no mass.     Tenderness: There is no abdominal tenderness. There is no guarding or rebound.     Hernia: No hernia is present.     Comments: Ileostomy widely patent.  No peritonitis  Musculoskeletal: Normal range of motion.        General: No swelling or tenderness.  Skin:    General: Skin is warm and dry.     Capillary Refill: Capillary refill takes less than 2 seconds.  Neurological:     General: No focal deficit present.  Mental Status: She is alert and oriented to person, place, and time.  Psychiatric:        Mood and Affect: Mood normal.        Behavior: Behavior normal.        Thought Content: Thought content normal.        Judgment: Judgment normal.       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  She now comes in for preoperative evaluation for ileostomy takedown port removal.  I have discussed with the patient in detail about the procedure.  Risk, benefits and possible complications including but not limited to: Bleeding, infection, leak, chronic pain, hernias.  She understands and wishes to proceed.  Greater than 50% of the 40 minutes  visit was spent in counseling/coordination of care   Caroleen Hamman, MD [place] Surgeon

## 2019-07-22 NOTE — Progress Notes (Signed)
Outpatient Surgical Follow Up  07/22/2019  JAMEILA Allen is an 73 y.o. female.   Chief Complaint  Patient presents with  . 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  She has already completed radiation and chemotherapy.  She did have repeat colonoscopy that I have personally reviewed showing no evidence of concerning lesions.  There was some areas that were biopsy showing no evidence of cancer.  The anastomosis was widely patent. She is feeling stronger.  She does complain of some chronic itching and chronic back pain.  I have personally reviewed her CBC and CMP that is completely normal. She also had a CT scan last month that I have personally reviewed showing no evidence of recurrence within the pelvic area no evidence of intra-abdominal metastatic disease  Past Medical History:  Diagnosis Date  . Anxiety   . Arthritis   . Asthma   . Cervical central spinal stenosis (C3-C7) (worse at C4-5) 07/30/2017  . Cervical foraminal stenosis (C4-5 and C5-6) (Bilateral) (L>R) 07/30/2017  . Chronic lower extremity pain (Fourth Area of Pain) (Bilateral) (R>L) 06/11/2017  . Chronic neck pain (Primary Area of Pain) (Bilateral) (R>L) 06/11/2017  . Chronic sacroiliac joint pain (Bilateral) (R>L) 06/11/2017  . Chronic shoulder pain [facility] Area of Pain) (Right) 06/11/2017  . Chronic upper extremity pain (Fifth Area of Pain) (Bilateral) (R>L) 06/11/2017  . Colon cancer ([place]) 11/2018   Partial colon resection  . DDD (degenerative disc disease), cervical 07/30/2017  . DDD (degenerative disc disease), lumbar 07/30/2017  . Depression   . DISH (diffuse idiopathic skeletal hyperostosis) 07/30/2017  . Dyspnea    with exertion  . Entrapment syndrome 06/20/2017   2002 on R and 2010 on L  . Full thickness rotator cuff tear 06/12/2017  . GERD  (gastroesophageal reflux disease)   . Grade 1 Anterolisthesis of L4 over L5 07/30/2017  . Headache   . Hx: UTI (urinary tract infection)   . Hypertension   . Inflammation of joint of shoulder region 01/15/2017  . Lumbar central spinal stenosis (L4-5) 07/30/2017  . Lumbar disc protrusion (Left: L5-S1) (Right: L1-2) 07/30/2017   L5-S1 left foraminal protrusion with L5 impingement. L1-2 right paracentral protrusion without impingement.  . Lumbar facet arthropathy (Bilateral) 07/30/2017  . Lumbar facet syndrome (Bilateral) (R>L) 07/30/2017  . Lung cancer ([place]) 11/2018   Chemo  and rad tx's  . Osteoarthritis of shoulder (Right) 01/15/2017    Past Surgical History:  Procedure Laterality Date  . ABDOMINAL HYSTERECTOMY    . APPENDECTOMY    . BREAST REDUCTION SURGERY Bilateral   . COLONOSCOPY WITH PROPOFOL N/A 11/05/2018   Procedure: COLONOSCOPY WITH PROPOFOL;  Surgeon: Virgel Manifold, MD;  Location: [facility] ENDOSCOPY;  Service: Endoscopy;  Laterality: N/A;  . COLONOSCOPY WITH PROPOFOL N/A 11/06/2018   Procedure: COLONOSCOPY WITH PROPOFOL;  Surgeon: Virgel Manifold, MD;  Location: [facility] ENDOSCOPY;  Service: Endoscopy;  Laterality: N/A;  . COLONOSCOPY WITH PROPOFOL N/A 07/15/2019   Procedure: COLONOSCOPY WITH PROPOFOL;  Surgeon: Virgel Manifold, MD;  Location: [facility] ENDOSCOPY;  Service: Endoscopy;  Laterality: N/A;  . COLOSTOMY    . DILATION AND CURETTAGE OF UTERUS    . ESOPHAGOGASTRODUODENOSCOPY (EGD) WITH PROPOFOL N/A 11/05/2018   Procedure: ESOPHAGOGASTRODUODENOSCOPY (EGD) WITH PROPOFOL;  Surgeon: Virgel Manifold, MD;  Location: [facility] ENDOSCOPY;  Service: Endoscopy;  Laterality:  N/A;  . ESOPHAGOGASTRODUODENOSCOPY (EGD) WITH PROPOFOL N/A 03/27/2019   Procedure: ESOPHAGOGASTRODUODENOSCOPY (EGD) WITH PROPOFOL;  Surgeon: Lucilla Lame, MD;  Location: [facility] ENDOSCOPY;  Service: Endoscopy;  Laterality: N/A;  . ILEOSTOMY Right 11/27/2018   Procedure: ILEOSTOMY;  Surgeon: Jules Husbands, MD;   Location: [facility] ORS;  Service: General;  Laterality: Right;  . LAPAROSCOPIC SIGMOID COLECTOMY N/A 11/27/2018   Procedure: LAPAROSCOPIC SIGMOID COLECTOMY;  Surgeon: Jules Husbands, MD;  Location: [facility] ORS;  Service: General;  Laterality: N/A;  . ORIF ANKLE FRACTURE Right 11/21/2018   Procedure: OPEN REDUCTION INTERNAL FIXATION (ORIF) ANKLE FRACTURE;  Surgeon: Corky Mull, MD;  Location: [facility] ORS;  Service: Orthopedics;  Laterality: Right;  . PORTACATH PLACEMENT N/A 03/12/2019   Procedure: INSERTION PORT-A-CATH;  Surgeon: Jules Husbands, MD;  Location: [facility] ORS;  Service: General;  Laterality: N/A;  . REDUCTION MAMMAPLASTY    . SHOULDER ARTHROSCOPY WITH OPEN ROTATOR CUFF REPAIR AND DISTAL CLAVICLE ACROMINECTOMY Right 03/26/2017   Procedure: right shoulder arthroscopy, arthroscopic subacromial decompression, distal clavicle excision, mini open rotator cuff repair;  Surgeon: Thornton Park, MD;  Location: [facility] ORS;  Service: Orthopedics;  Laterality: Right;    Family History  Problem Relation Age of Onset  . Hypertension Mother   . Breast cancer Mother 77  . Breast cancer Sister 74    Social History:  reports that she quit smoking about 10 years ago. Her smoking use included cigarettes. She smoked 1.00 pack per day. She has never used smokeless tobacco. She reports current drug use. Drug: Marijuana. She reports that she does not drink alcohol.  Allergies:  Allergies  Allergen Reactions  . Ace Inhibitors Swelling  . Angiotensin Receptor Blockers Swelling  . Sulfa Antibiotics Itching  . Chlorthalidone Rash  . Flexeril [Cyclobenzaprine] Rash    Medications reviewed.    ROS Full ROS performed and is otherwise negative other than what is stated in HPI   BP (!) 171/99   Pulse 90   Temp (!) 97.2 F (36.2 C) (Temporal)   Resp 14   Ht 5\' 5"  (1.651 m)   Wt 153 lb 6.4 oz (69.6 kg)   SpO2 99%   BMI 25.53 kg/m   Physical Exam Vitals signs and nursing note reviewed. Exam conducted  with a chaperone present.  Constitutional:      General: She is not in acute distress.    Appearance: Normal appearance. She is normal weight.  Eyes:     General:        Right eye: No discharge.        Left eye: No discharge.  Neck:     Musculoskeletal: Normal range of motion.  Cardiovascular:     Rate and Rhythm: Normal rate and regular rhythm.  Pulmonary:     Effort: Pulmonary effort is normal. No respiratory distress.  Abdominal:     General: Abdomen is flat. There is no distension.     Palpations: Abdomen is soft. There is no mass.     Tenderness: There is no abdominal tenderness. There is no guarding or rebound.     Hernia: No hernia is present.     Comments: Ileostomy widely patent.  No peritonitis  Musculoskeletal: Normal range of motion.        General: No swelling or tenderness.  Skin:    General: Skin is warm and dry.     Capillary Refill: Capillary refill takes less than 2 seconds.  Neurological:     General: No focal deficit present.  Mental Status: She is alert and oriented to person, place, and time.  Psychiatric:        Mood and Affect: Mood normal.        Behavior: Behavior normal.        Thought Content: Thought content normal.        Judgment: Judgment normal.       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  She now comes in for preoperative evaluation for ileostomy takedown port removal.  I have discussed with the patient in detail about the procedure.  Risk, benefits and possible complications including but not limited to: Bleeding, infection, leak, chronic pain, hernias.  She understands and wishes to proceed.  Greater than 50% of the 40 minutes  visit was spent in counseling/coordination of care   Caroleen Hamman, MD [place] [facility]

## 2019-07-26 DIAGNOSIS — S82841S Displaced bimalleolar fracture of right lower leg, sequela: Secondary | ICD-10-CM | POA: Diagnosis not present

## 2019-07-28 DIAGNOSIS — N189 Chronic kidney disease, unspecified: Secondary | ICD-10-CM | POA: Diagnosis not present

## 2019-07-28 DIAGNOSIS — M5030 Other cervical disc degeneration, unspecified cervical region: Secondary | ICD-10-CM | POA: Diagnosis not present

## 2019-07-28 DIAGNOSIS — M5136 Other intervertebral disc degeneration, lumbar region: Secondary | ICD-10-CM | POA: Diagnosis not present

## 2019-07-28 DIAGNOSIS — Z433 Encounter for attention to colostomy: Secondary | ICD-10-CM | POA: Diagnosis not present

## 2019-07-28 DIAGNOSIS — Z85038 Personal history of other malignant neoplasm of large intestine: Secondary | ICD-10-CM | POA: Diagnosis not present

## 2019-07-28 DIAGNOSIS — M19011 Primary osteoarthritis, right shoulder: Secondary | ICD-10-CM | POA: Diagnosis not present

## 2019-07-28 DIAGNOSIS — M459 Ankylosing spondylitis of unspecified sites in spine: Secondary | ICD-10-CM | POA: Diagnosis not present

## 2019-07-28 DIAGNOSIS — K219 Gastro-esophageal reflux disease without esophagitis: Secondary | ICD-10-CM | POA: Diagnosis not present

## 2019-07-28 DIAGNOSIS — M4802 Spinal stenosis, cervical region: Secondary | ICD-10-CM | POA: Diagnosis not present

## 2019-07-28 DIAGNOSIS — M8580 Other specified disorders of bone density and structure, unspecified site: Secondary | ICD-10-CM | POA: Diagnosis not present

## 2019-07-28 DIAGNOSIS — I129 Hypertensive chronic kidney disease with stage 1 through stage 4 chronic kidney disease, or unspecified chronic kidney disease: Secondary | ICD-10-CM | POA: Diagnosis not present

## 2019-07-28 DIAGNOSIS — Z85118 Personal history of other malignant neoplasm of bronchus and lung: Secondary | ICD-10-CM | POA: Diagnosis not present

## 2019-07-29 ENCOUNTER — Ambulatory Visit
Admission: RE | Admit: 2019-07-29 | Discharge: 2019-07-29 | Disposition: A | Discharge status: Home / Self Care | Payer: Medicare Other | Source: Ambulatory Visit | Attending: Student in an Organized Health Care Education/Training Program | Admitting: Student in an Organized Health Care Education/Training Program

## 2019-07-29 ENCOUNTER — Other Ambulatory Visit: Payer: Self-pay

## 2019-07-29 ENCOUNTER — Encounter: Payer: Self-pay | Admitting: Student in an Organized Health Care Education/Training Program

## 2019-07-29 ENCOUNTER — Ambulatory Visit (HOSPITAL_BASED_OUTPATIENT_CLINIC_OR_DEPARTMENT_OTHER): Payer: Medicare Other | Admitting: Student in an Organized Health Care Education/Training Program

## 2019-07-29 ENCOUNTER — Telehealth: Payer: Self-pay

## 2019-07-29 VITALS — BP 184/87 | HR 90 | Temp 98.6°F | Ht 65.0 in | Wt 153.0 lb

## 2019-07-29 DIAGNOSIS — M5441 Lumbago with sciatica, right side: Secondary | ICD-10-CM

## 2019-07-29 DIAGNOSIS — G894 Chronic pain syndrome: Secondary | ICD-10-CM

## 2019-07-29 DIAGNOSIS — G8929 Other chronic pain: Secondary | ICD-10-CM

## 2019-07-29 DIAGNOSIS — M533 Sacrococcygeal disorders, not elsewhere classified: Secondary | ICD-10-CM | POA: Insufficient documentation

## 2019-07-29 DIAGNOSIS — M47816 Spondylosis without myelopathy or radiculopathy, lumbar region: Secondary | ICD-10-CM

## 2019-07-29 DIAGNOSIS — M48062 Spinal stenosis, lumbar region with neurogenic claudication: Secondary | ICD-10-CM | POA: Insufficient documentation

## 2019-07-29 DIAGNOSIS — M51369 Other intervertebral disc degeneration, lumbar region without mention of lumbar back pain or lower extremity pain: Secondary | ICD-10-CM

## 2019-07-29 DIAGNOSIS — M5126 Other intervertebral disc displacement, lumbar region: Secondary | ICD-10-CM | POA: Insufficient documentation

## 2019-07-29 DIAGNOSIS — M5442 Lumbago with sciatica, left side: Secondary | ICD-10-CM

## 2019-07-29 DIAGNOSIS — M5136 Other intervertebral disc degeneration, lumbar region: Secondary | ICD-10-CM | POA: Insufficient documentation

## 2019-07-29 DIAGNOSIS — M545 Low back pain: Secondary | ICD-10-CM | POA: Diagnosis not present

## 2019-07-29 MED ORDER — PREGABALIN 50 MG PO CAPS: ORAL_CAPSULE | ORAL | 0 refills | Status: DC

## 2019-07-29 MED ORDER — TIZANIDINE HCL 4 MG PO TABS: 4.0000 mg | ORAL_TABLET | Freq: Two times a day (BID) | ORAL | 0 refills | Status: AC | PRN

## 2019-07-29 NOTE — Telephone Encounter (Signed)
Pt has been advised of pre admission date/time, Covid Testing date and Surgery date.  Surgery Date: 08/18/19 Preadmission Testing Date: will call patient Covid Testing Date: 08/14/19 - patient advised to go to the [place] ([place]  [employer] Video sent via [employer] [facility] and [employer].  Patient has been made aware to call [phone], between 1-3:00pm the day before surgery, to find out what time to arrive.

## 2019-07-29 NOTE — Progress Notes (Signed)
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 Diagnoses of Lumbar facet syndrome (Bilateral) (R>L), DDD (degenerative disc disease), lumbar, Chronic low back pain (Secondary Area of Pain) (Bilateral) (R>L), Lumbar central spinal stenosis (L4-5), Lumbar disc protrusion (Left: L5-S1) (Right: L1-2), Chronic pain syndrome, and Chronic SI joint pain were also pertinent to this visit.  Visit Diagnosis (New problems to examiner): 1. Lumbar facet arthropathy (Bilateral)   2. Lumbar facet syndrome (Bilateral) (R>L)   3. DDD (degenerative disc disease), lumbar   4. Chronic low back pain (Secondary Area of Pain) (Bilateral) (R>L)   5. Lumbar central spinal stenosis (L4-5)   6. Lumbar disc protrusion (Left: L5-S1) (Right: L1-2)   7. Chronic pain syndrome   8. Chronic SI joint pain    Plan of Care (Initial workup plan)  Note: Gloria Allen was reminded that as per protocol, today's visit has been an evaluation only. We have not taken over the patient's controlled substance management.  General Recommendations: The pain condition that the patient suffers from is best treated with a multidisciplinary approach that involves an increase in physical activity to prevent de-conditioning and worsening of the pain cycle, as well as psychological counseling (formal and/or informal) to address the co-morbid psychological affects of pain. Treatment will often involve judicious use of pain medications and interventional procedures to decrease the pain, allowing the patient to participate in the physical activity that will ultimately produce long-lasting pain reductions. The goal of the multidisciplinary approach is to return the patient to a higher level of overall  function and to restore their ability to perform activities of daily living.  73 year old female who presents with a chief complaint of axial low back and bilateral hip pain that radiates into her posterior lateral thigh.  Patient has a  history of lung cancer, colon cancer status post chemotherapy and radiation.  She has a colostomy bag present.  I did assess a discussion with the patient regarding pain management.  I suspect that a large component of her pain is due to lumbar degenerative disease including lumbar facet arthropathy and lumbar spondylosis as suggested by her lumbar MRI and physical exam findings of pain with facet loading.  I recommend the patient consider diagnostic lumbar facet medial branch nerve blocks and possible lumbar radiofrequency ablation for her low back pain but the patient declined.  She states that she is not interested in interventional pain procedures.  I informed her that mics per teas is interventional pain management and that we usually recommend nerve blocks or radiofrequency ablations for her pain condition as these are usually more effective than oral analgesic agents, many of which she has tried.  Patient has tried gabapentin in the past which was not effective.  I recommend Lyrica as below.  Also recommend Zanaflex as below that she can take as needed for lumbar paraspinal muscle spasms.  In regards to chronic opioid therapy, patient does have documented THC intake on her previous urine toxicology screens.  I will obtain a urine toxicology screen which is customary for new patients and also send the patient to psychology for substance abuse disorder evaluation.  I informed the patient that if her UDS was inappropriate, she would not be a candidate for chronic opioid therapy.  I instructed the patient to follow-up after she has completed her psychological assessment.    Lab Orders     Compliance Drug Analysis, Ur  Imaging Orders     DG Lumbar Spine Complete  W/Bend     DG Si Joints  Referral Orders     Ambulatory referral to Psychology  Pharmacotherapy (current): Medications ordered:  Meds ordered this encounter  Medications  . tiZANidine (ZANAFLEX) 4 MG tablet    Sig: Take 1 tablet (4 mg total) by mouth 2 (two) times daily as needed for muscle spasms.    Dispense:  60 tablet    Refill:  0    Do not place this medication, or any other prescription from our practice, on "Automatic Refill". Patient may have prescription filled one day early if pharmacy is closed on scheduled refill date.  . pregabalin (LYRICA) 50 MG capsule    Sig: Take 1 capsule (50 mg total) by mouth at bedtime for 30 days, THEN 1 capsule (50 mg total) 2 (two) times daily.    Dispense:  150 capsule    Refill:  0   Medications administered during this visit: Izora Gala C. Shimko had no medications administered during this visit.   Pharmacological management options:  Opioid Analgesics: The patient was informed that there is no guarantee that she would be a candidate for opioid analgesics. The decision will be made following CDC guidelines. This decision will be based on the results of diagnostic studies, as well as Gloria Allen's risk profile.   Membrane stabilizer: Tried and failed gabapentin in the past.  Trial of Lyrica as above.  Consider Cymbalta in future  Muscle relaxant: Trial of tizanidine.  Can consider Robaxin in future  NSAID: To be determined at a later time  Other analgesic(s): To be determined at a later time   Interventional management options: Gloria Allen was informed that there is no guarantee that she would be a candidate for interventional therapies. The decision will be based on the results of diagnostic studies, as well  as Gloria Allen risk profile.  Procedure(s) under consideration:  Patient is not interested in any interventional pain therapies although based upon her lumbar MRI, she would be a good candidate and could potentially obtain good relief from  diagnostic lumbar facet blocks and possibly lumbar RFA.   Provider-requested follow-up: Return for pt will call after her psych visit.  Future Appointments  Date Time Provider [place]  09/01/2019  9:15 AM [facility] None  09/01/2019 10:00 AM [facility] LAB [facility] None  09/01/2019 11:00 AM [facility]  09/03/2019 10:00 AM [facility] None  09/03/2019 11:00 AM [facility]  09/08/2019  9:00 AM [facility] None  09/08/2019  9:45 AM [facility] LAB [facility] None  09/08/2019 10:15 AM Sindy Guadeloupe, MD [facility] None  09/30/2019 12:45 PM [facility] None  09/30/2019  1:30 PM Noreene Filbert, MD [facility] None    Primary Care Physician: Inc, New Post Location: [facility] Facility Note by: Gillis Santa, MD Date: 07/29/2019; Time: 2:02 PM  Note: This dictation was prepared with Dragon dictation. Any transcriptional errors that may result from this process are unintentional.

## 2019-07-31 ENCOUNTER — Telehealth: Payer: Self-pay

## 2019-07-31 NOTE — Telephone Encounter (Signed)
Pt has been advised of pre admission date/time, Covid Testing date and Surgery date.  Surgery Date: 08/18/19 Preadmission Testing Date: 08/14/19 @ 9:00 am Covid Testing Date: 08/13/19 - patient advised to go to the [place] ([place]  [employer] Video sent via [employer] and [employer].  Patient has been made aware to call [phone], between 1-3:00pm the day before surgery, to find out what time to arrive.

## 2019-08-01 LAB — COMPLIANCE DRUG ANALYSIS, UR

## 2019-08-05 DIAGNOSIS — M8580 Other specified disorders of bone density and structure, unspecified site: Secondary | ICD-10-CM | POA: Diagnosis not present

## 2019-08-05 DIAGNOSIS — Z433 Encounter for attention to colostomy: Secondary | ICD-10-CM | POA: Diagnosis not present

## 2019-08-05 DIAGNOSIS — Z85038 Personal history of other malignant neoplasm of large intestine: Secondary | ICD-10-CM | POA: Diagnosis not present

## 2019-08-05 DIAGNOSIS — Z85118 Personal history of other malignant neoplasm of bronchus and lung: Secondary | ICD-10-CM | POA: Diagnosis not present

## 2019-08-05 DIAGNOSIS — N189 Chronic kidney disease, unspecified: Secondary | ICD-10-CM | POA: Diagnosis not present

## 2019-08-05 DIAGNOSIS — M5030 Other cervical disc degeneration, unspecified cervical region: Secondary | ICD-10-CM | POA: Diagnosis not present

## 2019-08-05 DIAGNOSIS — I129 Hypertensive chronic kidney disease with stage 1 through stage 4 chronic kidney disease, or unspecified chronic kidney disease: Secondary | ICD-10-CM | POA: Diagnosis not present

## 2019-08-05 DIAGNOSIS — M459 Ankylosing spondylitis of unspecified sites in spine: Secondary | ICD-10-CM | POA: Diagnosis not present

## 2019-08-05 DIAGNOSIS — K219 Gastro-esophageal reflux disease without esophagitis: Secondary | ICD-10-CM | POA: Diagnosis not present

## 2019-08-05 DIAGNOSIS — M4802 Spinal stenosis, cervical region: Secondary | ICD-10-CM | POA: Diagnosis not present

## 2019-08-05 DIAGNOSIS — M5136 Other intervertebral disc degeneration, lumbar region: Secondary | ICD-10-CM | POA: Diagnosis not present

## 2019-08-05 DIAGNOSIS — M19011 Primary osteoarthritis, right shoulder: Secondary | ICD-10-CM | POA: Diagnosis not present

## 2019-08-08 ENCOUNTER — Ambulatory Visit (HOSPITAL_COMMUNITY): Payer: Medicare Other | Admitting: Psychiatry

## 2019-08-08 ENCOUNTER — Other Ambulatory Visit: Payer: Self-pay

## 2019-08-11 DIAGNOSIS — Z780 Asymptomatic menopausal state: Secondary | ICD-10-CM | POA: Diagnosis not present

## 2019-08-11 DIAGNOSIS — M4802 Spinal stenosis, cervical region: Secondary | ICD-10-CM | POA: Diagnosis not present

## 2019-08-11 DIAGNOSIS — Z85118 Personal history of other malignant neoplasm of bronchus and lung: Secondary | ICD-10-CM | POA: Diagnosis not present

## 2019-08-11 DIAGNOSIS — Z433 Encounter for attention to colostomy: Secondary | ICD-10-CM | POA: Diagnosis not present

## 2019-08-11 DIAGNOSIS — M459 Ankylosing spondylitis of unspecified sites in spine: Secondary | ICD-10-CM | POA: Diagnosis not present

## 2019-08-11 DIAGNOSIS — M8580 Other specified disorders of bone density and structure, unspecified site: Secondary | ICD-10-CM | POA: Diagnosis not present

## 2019-08-11 DIAGNOSIS — M8589 Other specified disorders of bone density and structure, multiple sites: Secondary | ICD-10-CM | POA: Diagnosis not present

## 2019-08-11 DIAGNOSIS — N189 Chronic kidney disease, unspecified: Secondary | ICD-10-CM | POA: Diagnosis not present

## 2019-08-11 DIAGNOSIS — Z85038 Personal history of other malignant neoplasm of large intestine: Secondary | ICD-10-CM | POA: Diagnosis not present

## 2019-08-11 DIAGNOSIS — M5030 Other cervical disc degeneration, unspecified cervical region: Secondary | ICD-10-CM | POA: Diagnosis not present

## 2019-08-11 DIAGNOSIS — M19011 Primary osteoarthritis, right shoulder: Secondary | ICD-10-CM | POA: Diagnosis not present

## 2019-08-11 DIAGNOSIS — M5136 Other intervertebral disc degeneration, lumbar region: Secondary | ICD-10-CM | POA: Diagnosis not present

## 2019-08-11 DIAGNOSIS — I129 Hypertensive chronic kidney disease with stage 1 through stage 4 chronic kidney disease, or unspecified chronic kidney disease: Secondary | ICD-10-CM | POA: Diagnosis not present

## 2019-08-11 DIAGNOSIS — I10 Essential (primary) hypertension: Secondary | ICD-10-CM | POA: Diagnosis not present

## 2019-08-11 DIAGNOSIS — K219 Gastro-esophageal reflux disease without esophagitis: Secondary | ICD-10-CM | POA: Diagnosis not present

## 2019-08-12 ENCOUNTER — Telehealth: Payer: Self-pay | Admitting: *Deleted

## 2019-08-12 ENCOUNTER — Telehealth: Payer: Self-pay | Admitting: Student in an Organized Health Care Education/Training Program

## 2019-08-12 DIAGNOSIS — G894 Chronic pain syndrome: Secondary | ICD-10-CM

## 2019-08-12 NOTE — Telephone Encounter (Signed)
Called this patient to verify that it would be okay to speak with Gloria Allen from [facility] as she has called and left a voicemail wanting to speak to a nurse or Dr Gloria Allen.  Patient states that Gloria Allen was out on yesterday and was calling around to try and get some pain medicine for her Gloria Allen).  Upon further review patient was last seen on 07/29/19 as a new patient.  I told her that typically Dr Gloria Allen will not prescribe any medication prior to all assessments are complete, UDS, Psych evaluation, which has not been completed. Patient states that he did give her medication at new visit and when checked he did prescribe Lyrica at a titratable dose and Zanaflex for muscle spasms.  I explained to patient that both of these medications were given to help improve her pain.  She states they are not helping.  I told her that did not think Dr Gloria Allen would prescribe any additional medicine at this time and that would be something they would have to discuss at next visit.  Also, let her know that if she was getting pain medication else where that it would be okay if provider felt warranted to have them prescribe at this time. Patient verbalizes u/o of information.

## 2019-08-12 NOTE — Addendum Note (Signed)
Addended by: Gillis Santa on: 08/12/2019 11:45 AM   Modules accepted: Orders

## 2019-08-12 NOTE — Telephone Encounter (Signed)
Thanks Gloria Allen- patient will nee to return to complete a second urine tox screen as her first was positive for THC. I will not prescribe opioids if pts are utilizing THC. If she would like to still be considered, she'll need to repeat UDS.  Thanks

## 2019-08-12 NOTE — Telephone Encounter (Signed)
Cletis Athens from [place] [phone] lvmail asking to speak with Nurse or Dr. Holley Raring.

## 2019-08-12 NOTE — Telephone Encounter (Signed)
Spoke back with patient and let her know that the results of her UDS were positive for THC.  Dr Holley Raring was not sure where this maybe coming from as she did not disclose any marijuana use or CBD oils or creams.  Patient stated that she does use a cream to rub on her and that occasionally she will smoke a joint.  I told her that in order for him to consider prescribing pain medication she will need a repeat UDS where the [facility] was either declining or absent and she will need this prior to her next visit.  Also, she will need to complete Psych evaluation at which she should then call for appt to f/up with Dr Holley Raring.  Patient understands that she should have UDS prior to f/up.

## 2019-08-13 ENCOUNTER — Other Ambulatory Visit: Payer: Self-pay

## 2019-08-13 ENCOUNTER — Encounter
Admission: RE | Admit: 2019-08-13 | Discharge: 2019-08-13 | Disposition: A | Discharge status: Home / Self Care | Payer: Medicare Other | Source: Ambulatory Visit | Attending: Surgery | Admitting: Surgery

## 2019-08-13 DIAGNOSIS — I10 Essential (primary) hypertension: Secondary | ICD-10-CM | POA: Diagnosis not present

## 2019-08-13 DIAGNOSIS — N3281 Overactive bladder: Secondary | ICD-10-CM | POA: Diagnosis not present

## 2019-08-13 DIAGNOSIS — Z01812 Encounter for preprocedural laboratory examination: Secondary | ICD-10-CM | POA: Insufficient documentation

## 2019-08-13 NOTE — Patient Instructions (Addendum)
Your procedure is scheduled on: 08-18-19 TUESDAY Report to Same Day Surgery 2nd floor medical mall [facility] Entrance-take elevator on left to 2nd floor.  Check in with surgery information desk.) To find out your arrival time please call [phone] between 1PM - 3PM on 08-17-19  Remember: Instructions that are not followed completely may result in serious medical risk, up to and including death, or upon the discretion of your surgeon and anesthesiologist your surgery may need to be rescheduled.    _x___ 1. Do not eat food after midnight the night before your procedure. NO GUM OR CANDY AFTER MIDNIGHT. You may drink clear liquids up to 2 hours before you are scheduled to arrive at the hospital for your procedure.  Do not drink clear liquids within 2 hours of your scheduled arrival to the hospital.  Clear liquids include  --Water or Apple juice without pulp  --Gatorade  --Black Coffee or Clear Tea (No milk, no creamers, do not add anything to the coffee or Tea   ____Ensure clear carbohydrate drink on the way to the hospital for bariatric patients  ____Ensure clear carbohydrate drink 3 hours before surgery.    __x__ 2. No Alcohol for 24 hours before or after surgery.   __x__3. No Smoking or e-cigarettes for 24 prior to surgery.  Do not use any chewable tobacco products for at least 6 hour prior to surgery   ____  4. Bring all medications with you on the day of surgery if instructed.    __x__ 5. Notify your doctor if there is any change in your medical condition     (cold, fever, infections).    x___6. On the morning of surgery brush your teeth with toothpaste and water.  You may rinse your mouth with mouth wash if you wish.  Do not swallow any toothpaste or mouthwash.   Do not wear jewelry, make-up, hairpins, clips or nail polish.  Do not wear lotions, powders, or perfumes.   Do not shave 48 hours prior to surgery. Men may shave face and neck.  Do not bring valuables to the  hospital.    [facility] is not responsible for any belongings or valuables.               Contacts, dentures or bridgework may not be worn into surgery.  Leave your suitcase in the car. After surgery it may be brought to your room.  For patients admitted to the hospital, discharge time is determined by your treatment team.  _  Patients discharged the day of surgery will not be allowed to drive home.  You will need someone to drive you home and stay with you the night of your procedure.    Please read over the following fact sheets that you were given:   [facility] Preparing for Surgery and or MRSA Information   _x___ TAKE THE FOLLOWING MEDICATION THE MORNING OF SURGERY WITH A SMALL SIP OF WATER. These include:  1. NORVASC (AMLODIPINE)  2. CLONIDINE (CATAPRESS)  3. HYDRALAZINE (APRESOLINE)  4. CLARITIN (LORATADINE)  5. METOPROLOL (TOPROL)  6. ZANAFLEX (TIZANIDINE)  7. PRILOSEC (OMEPRAZOLE)  8.TAKE AN EXTRA PRILOSEC THE NIGHT BEFORE YOUR SURGERY  ____Fleets enema or Magnesium Citrate as directed.   _x___ Use CHG Soap or sage wipes as directed on instruction sheet   _X___ Use inhalers on the day of surgery and bring to hospital day of surgery-USE YOUR BREO ELLIPTA AND ALBUTEROL INHALER AM OF SURGERY AND BRING ALBUTEROL [place]  ____ Stop Metformin and Janumet 2 days prior to surgery.    ____ Take 1/2 of usual insulin dose the night before surgery and none on the morning surgery.   ____ Follow recommendations from Cardiologist, Pulmonologist or PCP regarding stopping Aspirin, Coumadin, Plavix ,Eliquis, Effient, or Pradaxa, and Pletal.  X____Stop Anti-inflammatories such as Advil, Aleve, Ibuprofen, Motrin, Naproxen, Naprosyn, Goodies powders or aspirin products NOW-OK to take Tylenol   ____ Stop supplements until after surgery.     ____ Bring C-Pap to the hospital.

## 2019-08-14 ENCOUNTER — Other Ambulatory Visit
Admission: RE | Admit: 2019-08-14 | Discharge: 2019-08-14 | Disposition: A | Discharge status: Home / Self Care | Payer: Medicare Other | Source: Ambulatory Visit | Attending: Surgery | Admitting: Surgery

## 2019-08-14 ENCOUNTER — Other Ambulatory Visit: Payer: Self-pay

## 2019-08-14 ENCOUNTER — Telehealth: Payer: Self-pay | Admitting: Nurse Practitioner

## 2019-08-14 DIAGNOSIS — Z01812 Encounter for preprocedural laboratory examination: Secondary | ICD-10-CM | POA: Insufficient documentation

## 2019-08-14 DIAGNOSIS — Z20828 Contact with and (suspected) exposure to other viral communicable diseases: Secondary | ICD-10-CM | POA: Diagnosis not present

## 2019-08-14 LAB — SARS CORONAVIRUS 2 (TAT 6-24 HRS): SARS Coronavirus 2: NEGATIVE

## 2019-08-14 NOTE — Telephone Encounter (Signed)
Called patient to reschedule the Palliative care f/u visit for 07/21/19 - no answer and unable to leave a message due to mailbox was full.

## 2019-08-17 ENCOUNTER — Telehealth: Payer: Self-pay

## 2019-08-17 DIAGNOSIS — M5136 Other intervertebral disc degeneration, lumbar region: Secondary | ICD-10-CM | POA: Diagnosis not present

## 2019-08-17 DIAGNOSIS — Z433 Encounter for attention to colostomy: Secondary | ICD-10-CM | POA: Diagnosis not present

## 2019-08-17 DIAGNOSIS — M4802 Spinal stenosis, cervical region: Secondary | ICD-10-CM | POA: Diagnosis not present

## 2019-08-17 DIAGNOSIS — Z85118 Personal history of other malignant neoplasm of bronchus and lung: Secondary | ICD-10-CM | POA: Diagnosis not present

## 2019-08-17 DIAGNOSIS — M19011 Primary osteoarthritis, right shoulder: Secondary | ICD-10-CM | POA: Diagnosis not present

## 2019-08-17 DIAGNOSIS — M5030 Other cervical disc degeneration, unspecified cervical region: Secondary | ICD-10-CM | POA: Diagnosis not present

## 2019-08-17 DIAGNOSIS — N189 Chronic kidney disease, unspecified: Secondary | ICD-10-CM | POA: Diagnosis not present

## 2019-08-17 DIAGNOSIS — K219 Gastro-esophageal reflux disease without esophagitis: Secondary | ICD-10-CM | POA: Diagnosis not present

## 2019-08-17 DIAGNOSIS — Z85038 Personal history of other malignant neoplasm of large intestine: Secondary | ICD-10-CM | POA: Diagnosis not present

## 2019-08-17 DIAGNOSIS — M459 Ankylosing spondylitis of unspecified sites in spine: Secondary | ICD-10-CM | POA: Diagnosis not present

## 2019-08-17 DIAGNOSIS — I129 Hypertensive chronic kidney disease with stage 1 through stage 4 chronic kidney disease, or unspecified chronic kidney disease: Secondary | ICD-10-CM | POA: Diagnosis not present

## 2019-08-17 DIAGNOSIS — M8580 Other specified disorders of bone density and structure, unspecified site: Secondary | ICD-10-CM | POA: Diagnosis not present

## 2019-08-17 MED ORDER — SODIUM CHLORIDE 0.9 % IV SOLN
2.0000 g | INTRAVENOUS | Status: AC
  Administered 2019-08-18: 2 g via INTRAVENOUS
  Filled 2019-08-17: qty 2

## 2019-08-17 NOTE — Pre-Procedure Instructions (Signed)
Progress Notes - documented in this encounter Gloria Allen, [place] - 03/06/2019 1:00 PM EDT Formatting of this note might be different from the original. Established Patient Visit   Chief Complaint: Chief Complaint  Patient presents with  . Follow-up  6 MONTHS  . Dizziness  Date of Service: 03/06/2019 Date of Birth: 22-Jan-1946 PCP: Gloria Edinger, MD  History of Present Illness: [name] is a 73 y.o.female patient with a past medical history significant for hypertension, hyperlipidemia, GERD, depression, adenocarcinoma of the sigmoid colon, and recently diagnosed lung cancer who presents for a follow up visit. She is stable from a cardiac standpoint and denies worsening/exertional chest pain, shortness of breath, palpitations, orthopnea, PND, or syncopal episodes. Admits to right lower extremity swelling from recent ankle fracture. She has ongoing dizziness in which meclizine has helped in the past. She is scheduled to undergo porta cath placement for chemotherapy treatment on 03/12/2019 with Dr. Dahlia Byes.   Stress test on 02/27/18 revealed anterior hypoperfusion with stress with borderline reversibility. Echocardiogram revealed normal LV function with an EF estimated greater than 55% with mild MR, mild TR. No evidence of valvular stenosis.   Past Medical and Surgical History  Past Medical History Past Medical History:  Diagnosis Date  . Depression  . GERD (gastroesophageal reflux disease)  . Hypertension   Past Surgical History She has a past surgical history that includes ORIF of displaced distal fibular fracture , right ankle (Right, 11/21/2018).   Medications and Allergies  Current Medications  Current Outpatient Medications  Medication Sig Dispense Refill  . megestroL (MEGACE) 400 mg/10 mL (40 mg/mL) suspension Take by mouth  . prochlorperazine (COMPAZINE) 10 MG tablet Take by mouth  . albuterol 90 mcg/actuation inhaler Inhale 2 inhalations into the lungs every 6 (six)  hours as needed for Wheezing  . amLODIPine (NORVASC) 10 MG tablet  . ARIPiprazole (ABILIFY) 5 MG tablet Take 5 mg by mouth once daily  . bisacodyL (DULCOLAX) 5 mg EC tablet Take 4 tablets (5 mg each) at 8 am the day before surgery.  . budesonide-formoterol (SYMBICORT) 160-4.5 mcg/actuation inhaler Inhale 2 inhalations into the lungs 2 (two) times daily  . clonazePAM (KLONOPIN) 0.5 MG tablet Take by mouth Take 0.5 mg by mouth 3 (three) times daily.  . cloNIDine HCl (CATAPRES) 0.2 MG tablet Take by mouth.  . cyclobenzaprine (FLEXERIL) 5 MG tablet Take by mouth Take 5 mg by mouth at bedtime.  . enoxaparin (LOVENOX) 40 mg/0.4 mL injection syringe Inject subcutaneously Inject 0.4 mLs (40 mg total) into the skin daily.  Marland Kitchen erythromycin base (E-MYCIN) 500 MG tablet Take 500 mg by mouth as directed. Take two tablets at 8 am, two tablets at 2 pm, and two tablets at 8 pm the day before surgery.  . fluconazole (DIFLUCAN) 200 MG tablet TAKE 2 TABLETS BY MOUTH ON DAY 1 THEN TAKE 1 TABLET DAILY FOR 14 DAYS  . fluticasone furoate-vilanteroL (BREO ELLIPTA) 200-25 mcg/dose DsDv Inhale into the lungs Inhale 1 puff into the lungs daily.  . food supplemt, lactose-reduced Liqd Take 1 Bottle by mouth 3 (three) times daily with meals 90 Bottle 2  . hydrALAZINE (APRESOLINE) 50 MG tablet TAKE 1 TABLET BY MOUTH THREE TIMES DAILY  . lidocaine (LMX) 5 % cream Apply topically as directed  . loperamide (IMODIUM) 2 mg capsule Take by mouth Take 1 capsule (2 mg total) by mouth 3 (three) times daily  . lovastatin (MEVACOR) 40 MG tablet Take 40 mg by mouth daily with dinner  .  magnesium oxide (MAG-OX) 400 mg tablet Take by mouth.  . meclizine (ANTIVERT) 12.5 mg tablet Take 1 tablet (12.5 mg total) by mouth 2 (two) times daily as needed for Dizziness Take 12.5 mg by mouth 3 (three) times daily as needed for dizziness. 60 tablet 3  . metoprolol succinate (TOPROL-XL) 50 MG XL tablet Take 1 tablet (50 mg total) by mouth once daily 30  tablet 11  . mirtazapine (REMERON) 15 MG tablet Take by mouth.  . montelukast (SINGULAIR) 10 mg tablet Take by mouth.  . neomycin 500 mg tablet Take two tablets at 8 am, two tablets at 2 pm, and two tablets at 8 pm the day before surgery.  Marland Kitchen neomycin 500 mg tablet  . nitrofurantoin (MACRODANTIN) 100 MG capsule Take by mouth Take 100 mg by mouth daily.  Marland Kitchen omega-3 acid ethyl esters (LOVAZA) 1 gram capsule Take 2 g by mouth 2 (two) times daily  . omeprazole (PRILOSEC) 20 MG DR capsule Take 20 mg by mouth once daily  . oxybutynin (DITROPAN) 5 mg tablet TAKE 1 TABLET BY MOUTH TWICE DAILY  . oxyCODONE (ROXICODONE) 5 MG immediate release tablet Take 1 tablet (5 mg total) by mouth every 8 (eight) hours as needed for Pain TAKE 1 TO 2 TABLETS BY MOUTH EVERY 4 HOURS AS NEEDED FOR MODERATE PAIN 20 tablet 0  . polyethylene glycol (MIRALAX) powder 255 grams one bottle for bowel prep  . potassium gluconate 2.5 mEq Tab Take by mouth Take 1 tablet by mouth daily as needed (cramps).  . sertraline (ZOLOFT) 50 MG tablet Take by mouth.  Manus Gunning BOWEL PREP KIT oral solution as directed TAKE AS DIRECTED  . traZODone (DESYREL) 50 MG tablet Take 50 mg by mouth nightly  . umeclidinium-vilanteroL (ANORO ELLIPTA) 62.5-25 mcg/actuation inhaler INHALE 1 PUFF BY MOUTH DAILY   No current facility-administered medications for this visit.   Allergies: Sulfa (sulfonamide antibiotics); Chlorthalidone; and Cyclobenzaprine  Social and Family History  Social History reports that she has quit smoking. She has never used smokeless tobacco. She reports that she does not drink alcohol.  Family History History reviewed. No pertinent family history.  Review of Systems   Review of Systems: The patient denies chest pain, shortness of breath, orthopnea, paroxysmal nocturnal dyspnea, pedal edema, palpitations, heart racing, fatigue, dizziness, lightheadedness, presyncope, syncope, leg pain, leg cramping. Review of 12 Systems is  negative except as described in HPI.   Physical Examination   Vitals:BP 150/84  Pulse 50  Ht 165.1 cm (_0 )  Wt 68 kg (150 lb)  LMP (LMP Unknown)  SpO2 94%  BMI 24.96 kg/m  Ht:165.1 cm (_1 ) Wt:68 kg (150 lb) FYB:OFBP surface area is 1.77 meters squared. Body mass index is 24.96 kg/m.  General: Well developed, well nourished. In no acute distress HEENT: Pupils equally reactive to light and accomodation  Neck: Supple without thyromegaly, or goiter. Carotid pulses 2+. No carotid bruits present.  Pulmonary: Clear to auscultation bilaterally; no wheezes, rales, rhonchi Cardiovascular: Regular rate and rhythm. No gallops, murmurs or rubs Gastrointestinal: Soft nontender, nondistended, with normal bowel sounds Extremities: No cyanosis or clubbing. Trace edema in right ankle Peripheral Pulses: 2+ in upper extremities, 2+ in lower extremities  Neurology: Alert and oriented X3 Pysch: Good affect. Responds appropriately  Assessment   73 y.o. female with  1. Essential hypertension  2. Atypical chest pain  3. Hyperlipidemia, unspecified hyperlipidemia type   Plan  1. Essential hypertension  -Continue amlodipine 6m daily, clonidine .242mtwice  daily, hydralazine 60m three times daily, and metoprolol 582mdaily  -Continue low sodium diet  2. Atypical chest pain  -Symptoms are stable; no further workup indicated at this time -Will continue to monitor  3. Hyperlipidemia  -Continue lovastatin 4021maily and omega 3 fatty acids   No orders of the defined types were placed in this encounter.  Return in about 6 months (around 09/05/2019).  I personally performed the service, non-incident to. (WP)[place] NICOLE LYNJulian HyA    Electronically signed by SteZeb ComfortA at 03/06/2019 1:36 PM EDT   Plan of Treatment - documented as of this encounter Upcoming Encounters Upcoming Encounters  Date Type Specialty Care Team Description  09/10/2019 Office Visit  Cardiology SteZeb ComfortA JacobusC [phone])     Visit Diagnoses - documented in this encounter Diagnosis  Essential hypertension - Primary   Atypical chest pain  Other chest pain   Hyperlipidemia, unspecified hyperlipidemia type    Discontinued Medications - documented as of this encounter Medication Sig Discontinue Reason Start Date End Date  chlorthalidone 25 MG tablet  Take 1 tablet (25 mg total) by mouth once daily Reports Not Taking 02/14/2018 03/06/2019  metoprolol succinate (TOPROL-XL) 50 MG XL tablet  Take by mouth Take 50 mg by mouth daily. Take with or immediately following a meal. Duplicate order  03/23/54/7322xybutynin (DITROPAN) 5 mg tablet  Take 5 mg by mouth 2 (two) times daily Duplicate order  03/02/53/2706raMADoL (ULTRAM) 50 mg tablet  Take by mouth Take 1 tablet (50 mg total) by mouth every 6 (six) hours. Reports Not Taking 11/22/2018 03/06/2019  traMADoL (ULTRAM) 50 mg tablet   Reports Not Taking 11/25/2018 03/06/2019  ondansetron (ZOFRAN) 8 MG tablet  Take by mouth Reports Not Taking 02/27/2019 03/06/2019  meclizine (ANTIVERT) 12.5 mg tablet  Take by mouth Take 12.5 mg by mouth 3 (three) times daily as needed for dizziness. Reorder 08/20/2018 03/06/2019  clonazePAM (KLONOPIN) 0.5 MG tablet  Take 0.5 mg by mouth 3 (three) times daily Duplicate order  03/24/75/2831Historical Medications - added in this encounter This list may reflect changes made after this encounter.  Medication Sig Dispensed Refills Start Date End Date  prochlorperazine (COMPAZINE) 10 MG tablet  Take by mouth  0 02/27/2019   ondansetron (ZOFRAN) 8 MG tablet  Take by mouth  0 02/27/2019 03/06/2019  megestroL (MEGACE) 400 mg/10 mL (40 mg/mL) suspension  Take by mouth  0 02/16/2019 03/18/2019  Images  Patient Contacts  Contact Name Contact Address Communication Relationship to Patient  Christin AdaMennknown  [phone] Health [facility] Centeron or Daughter, Emergency Contact  Document Information  Primary Care Provider Other Service Providers Document Coverage Dates  NoaGwynne EdingerD (Nov. 19, 2019November 19, 2019 - Present) DM: [phone]) [phone]) 322[place] [id number] Medicine  Jun. 05, 202W[place]ascot[address] William CourtrGalesburgC [id number] Providers Encounter Date  NicZeb ComfortA [place]) DM: [phone]) [phone] [id number] Disease Jun. 05, 2020June 05, 2020

## 2019-08-17 NOTE — Telephone Encounter (Signed)
Patient notified covid negative.

## 2019-08-18 ENCOUNTER — Encounter
Admission: RE | Disposition: A | Discharge status: Home Health | Payer: Self-pay | Source: Ambulatory Visit | Attending: Surgery

## 2019-08-18 ENCOUNTER — Ambulatory Visit: Payer: Medicare Other | Admitting: Registered Nurse

## 2019-08-18 ENCOUNTER — Inpatient Hospital Stay
Admission: RE | Admit: 2019-08-18 | Discharge: 2019-08-28 | DRG: 329 | Disposition: A | Discharge status: Home Health | Payer: Medicare Other | Source: Ambulatory Visit | Attending: Surgery | Admitting: Surgery

## 2019-08-18 ENCOUNTER — Other Ambulatory Visit: Payer: Self-pay

## 2019-08-18 DIAGNOSIS — K7689 Other specified diseases of liver: Secondary | ICD-10-CM | POA: Diagnosis not present

## 2019-08-18 DIAGNOSIS — C3411 Malignant neoplasm of upper lobe, right bronchus or lung: Secondary | ICD-10-CM | POA: Diagnosis not present

## 2019-08-18 DIAGNOSIS — S82841S Displaced bimalleolar fracture of right lower leg, sequela: Secondary | ICD-10-CM | POA: Diagnosis not present

## 2019-08-18 DIAGNOSIS — J700 Acute pulmonary manifestations due to radiation: Secondary | ICD-10-CM | POA: Diagnosis not present

## 2019-08-18 DIAGNOSIS — K435 Parastomal hernia without obstruction or  gangrene: Secondary | ICD-10-CM | POA: Diagnosis present

## 2019-08-18 DIAGNOSIS — Z90710 Acquired absence of both cervix and uterus: Secondary | ICD-10-CM

## 2019-08-18 DIAGNOSIS — Z66 Do not resuscitate: Secondary | ICD-10-CM | POA: Diagnosis present

## 2019-08-18 DIAGNOSIS — K219 Gastro-esophageal reflux disease without esophagitis: Secondary | ICD-10-CM | POA: Diagnosis not present

## 2019-08-18 DIAGNOSIS — R0602 Shortness of breath: Secondary | ICD-10-CM | POA: Diagnosis not present

## 2019-08-18 DIAGNOSIS — J45909 Unspecified asthma, uncomplicated: Secondary | ICD-10-CM | POA: Diagnosis present

## 2019-08-18 DIAGNOSIS — R05 Cough: Secondary | ICD-10-CM | POA: Diagnosis not present

## 2019-08-18 DIAGNOSIS — Z881 Allergy status to other antibiotic agents status: Secondary | ICD-10-CM | POA: Diagnosis not present

## 2019-08-18 DIAGNOSIS — Z20828 Contact with and (suspected) exposure to other viral communicable diseases: Secondary | ICD-10-CM | POA: Diagnosis present

## 2019-08-18 DIAGNOSIS — Z87891 Personal history of nicotine dependence: Secondary | ICD-10-CM | POA: Diagnosis not present

## 2019-08-18 DIAGNOSIS — J690 Pneumonitis due to inhalation of food and vomit: Secondary | ICD-10-CM | POA: Diagnosis not present

## 2019-08-18 DIAGNOSIS — Z9221 Personal history of antineoplastic chemotherapy: Secondary | ICD-10-CM | POA: Diagnosis not present

## 2019-08-18 DIAGNOSIS — E876 Hypokalemia: Secondary | ICD-10-CM | POA: Diagnosis present

## 2019-08-18 DIAGNOSIS — Z803 Family history of malignant neoplasm of breast: Secondary | ICD-10-CM | POA: Diagnosis not present

## 2019-08-18 DIAGNOSIS — Z85038 Personal history of other malignant neoplasm of large intestine: Secondary | ICD-10-CM | POA: Diagnosis not present

## 2019-08-18 DIAGNOSIS — R509 Fever, unspecified: Secondary | ICD-10-CM

## 2019-08-18 DIAGNOSIS — M47896 Other spondylosis, lumbar region: Secondary | ICD-10-CM | POA: Diagnosis present

## 2019-08-18 DIAGNOSIS — C187 Malignant neoplasm of sigmoid colon: Secondary | ICD-10-CM

## 2019-08-18 DIAGNOSIS — J181 Lobar pneumonia, unspecified organism: Secondary | ICD-10-CM | POA: Diagnosis not present

## 2019-08-18 DIAGNOSIS — M5136 Other intervertebral disc degeneration, lumbar region: Secondary | ICD-10-CM | POA: Diagnosis not present

## 2019-08-18 DIAGNOSIS — Z9889 Other specified postprocedural states: Secondary | ICD-10-CM

## 2019-08-18 DIAGNOSIS — G8929 Other chronic pain: Secondary | ICD-10-CM | POA: Diagnosis not present

## 2019-08-18 DIAGNOSIS — Z87440 Personal history of urinary (tract) infections: Secondary | ICD-10-CM

## 2019-08-18 DIAGNOSIS — M5030 Other cervical disc degeneration, unspecified cervical region: Secondary | ICD-10-CM | POA: Diagnosis not present

## 2019-08-18 DIAGNOSIS — M47816 Spondylosis without myelopathy or radiculopathy, lumbar region: Secondary | ICD-10-CM | POA: Diagnosis present

## 2019-08-18 DIAGNOSIS — Z923 Personal history of irradiation: Secondary | ICD-10-CM | POA: Diagnosis not present

## 2019-08-18 DIAGNOSIS — R29818 Other symptoms and signs involving the nervous system: Secondary | ICD-10-CM | POA: Diagnosis not present

## 2019-08-18 DIAGNOSIS — K660 Peritoneal adhesions (postprocedural) (postinfection): Secondary | ICD-10-CM | POA: Diagnosis present

## 2019-08-18 DIAGNOSIS — D649 Anemia, unspecified: Secondary | ICD-10-CM | POA: Diagnosis not present

## 2019-08-18 DIAGNOSIS — Y842 Radiological procedure and radiotherapy as the cause of abnormal reaction of the patient, or of later complication, without mention of misadventure at the time of the procedure: Secondary | ICD-10-CM | POA: Diagnosis present

## 2019-08-18 DIAGNOSIS — I951 Orthostatic hypotension: Secondary | ICD-10-CM | POA: Diagnosis not present

## 2019-08-18 DIAGNOSIS — Z85048 Personal history of other malignant neoplasm of rectum, rectosigmoid junction, and anus: Secondary | ICD-10-CM

## 2019-08-18 DIAGNOSIS — M19011 Primary osteoarthritis, right shoulder: Secondary | ICD-10-CM | POA: Diagnosis present

## 2019-08-18 DIAGNOSIS — Z933 Colostomy status: Secondary | ICD-10-CM

## 2019-08-18 DIAGNOSIS — R918 Other nonspecific abnormal finding of lung field: Secondary | ICD-10-CM | POA: Diagnosis not present

## 2019-08-18 DIAGNOSIS — C7800 Secondary malignant neoplasm of unspecified lung: Secondary | ICD-10-CM | POA: Diagnosis not present

## 2019-08-18 DIAGNOSIS — C189 Malignant neoplasm of colon, unspecified: Secondary | ICD-10-CM | POA: Diagnosis not present

## 2019-08-18 DIAGNOSIS — R0600 Dyspnea, unspecified: Secondary | ICD-10-CM | POA: Diagnosis not present

## 2019-08-18 DIAGNOSIS — I10 Essential (primary) hypertension: Secondary | ICD-10-CM | POA: Diagnosis not present

## 2019-08-18 DIAGNOSIS — Z8249 Family history of ischemic heart disease and other diseases of the circulatory system: Secondary | ICD-10-CM | POA: Diagnosis not present

## 2019-08-18 DIAGNOSIS — J90 Pleural effusion, not elsewhere classified: Secondary | ICD-10-CM | POA: Diagnosis not present

## 2019-08-18 DIAGNOSIS — Z85118 Personal history of other malignant neoplasm of bronchus and lung: Secondary | ICD-10-CM

## 2019-08-18 DIAGNOSIS — R197 Diarrhea, unspecified: Secondary | ICD-10-CM | POA: Diagnosis not present

## 2019-08-18 DIAGNOSIS — J189 Pneumonia, unspecified organism: Secondary | ICD-10-CM | POA: Diagnosis not present

## 2019-08-18 DIAGNOSIS — C3490 Malignant neoplasm of unspecified part of unspecified bronchus or lung: Secondary | ICD-10-CM | POA: Diagnosis present

## 2019-08-18 DIAGNOSIS — M48061 Spinal stenosis, lumbar region without neurogenic claudication: Secondary | ICD-10-CM | POA: Diagnosis present

## 2019-08-18 DIAGNOSIS — Z432 Encounter for attention to ileostomy: Secondary | ICD-10-CM | POA: Diagnosis not present

## 2019-08-18 DIAGNOSIS — R531 Weakness: Secondary | ICD-10-CM | POA: Diagnosis not present

## 2019-08-18 DIAGNOSIS — Z932 Ileostomy status: Secondary | ICD-10-CM | POA: Diagnosis not present

## 2019-08-18 DIAGNOSIS — R109 Unspecified abdominal pain: Secondary | ICD-10-CM | POA: Diagnosis not present

## 2019-08-18 HISTORY — PX: PORT-A-CATH REMOVAL: SHX5289

## 2019-08-18 HISTORY — PX: ILEOSTOMY CLOSURE: SHX1784

## 2019-08-18 LAB — CBC
HCT: 41.4 % (ref 36.0–46.0)
Hemoglobin: 13.7 g/dL (ref 12.0–15.0)
MCH: 28.7 pg (ref 26.0–34.0)
MCHC: 33.1 g/dL (ref 30.0–36.0)
MCV: 86.8 fL (ref 80.0–100.0)
Platelets: 251 10*3/uL (ref 150–400)
RBC: 4.77 MIL/uL (ref 3.87–5.11)
RDW: 13.1 % (ref 11.5–15.5)
WBC: 20.1 10*3/uL — ABNORMAL HIGH (ref 4.0–10.5)
nRBC: 0 % (ref 0.0–0.2)

## 2019-08-18 LAB — URINE DRUG SCREEN, QUALITATIVE (ARMC ONLY)
Amphetamines, Ur Screen: NOT DETECTED
Barbiturates, Ur Screen: NOT DETECTED
Benzodiazepine, Ur Scrn: NOT DETECTED
Cannabinoid 50 Ng, Ur ~~LOC~~: NOT DETECTED
Cocaine Metabolite,Ur ~~LOC~~: NOT DETECTED
MDMA (Ecstasy)Ur Screen: NOT DETECTED
Methadone Scn, Ur: NOT DETECTED
Opiate, Ur Screen: NOT DETECTED
Phencyclidine (PCP) Ur S: NOT DETECTED
Tricyclic, Ur Screen: POSITIVE — AB

## 2019-08-18 LAB — CREATININE, SERUM
Creatinine, Ser: 0.87 mg/dL (ref 0.44–1.00)
GFR calc Af Amer: >60 mL/min (ref >60)
GFR calc non Af Amer: >60 mL/min (ref >60)

## 2019-08-18 SURGERY — CLOSURE, ILEOSTOMY
Anesthesia: General | Site: Chest | Laterality: Right

## 2019-08-18 MED ORDER — LIDOCAINE HCL (PF) 2 % IJ SOLN
INTRAMUSCULAR | Status: AC
  Filled 2019-08-18: qty 10

## 2019-08-18 MED ORDER — FAMOTIDINE 20 MG PO TABS
ORAL_TABLET | ORAL | Status: AC
  Filled 2019-08-18: qty 1

## 2019-08-18 MED ORDER — LACTATED RINGERS IV SOLN
INTRAVENOUS | Status: DC
  Administered 2019-08-18 (×2): via INTRAVENOUS

## 2019-08-18 MED ORDER — MORPHINE SULFATE (PF) 2 MG/ML IV SOLN
2.0000 mg | INTRAVENOUS | Status: DC | PRN
  Administered 2019-08-18 – 2019-08-23 (×9): 2 mg via INTRAVENOUS
  Filled 2019-08-18 (×9): qty 1

## 2019-08-18 MED ORDER — DEXAMETHASONE SODIUM PHOSPHATE 10 MG/ML IJ SOLN
INTRAMUSCULAR | Status: AC
  Filled 2019-08-18: qty 1

## 2019-08-18 MED ORDER — CLONIDINE HCL 0.1 MG PO TABS
0.2000 mg | ORAL_TABLET | Freq: Two times a day (BID) | ORAL | Status: DC
  Administered 2019-08-18 – 2019-08-21 (×6): 0.2 mg via ORAL
  Filled 2019-08-18 (×6): qty 2

## 2019-08-18 MED ORDER — AMLODIPINE BESYLATE 5 MG PO TABS
5.0000 mg | ORAL_TABLET | Freq: Every day | ORAL | Status: DC
  Administered 2019-08-18: 5 mg via ORAL
  Filled 2019-08-18 (×3): qty 1

## 2019-08-18 MED ORDER — HYDROXYZINE HCL 10 MG PO TABS
10.0000 mg | ORAL_TABLET | Freq: Three times a day (TID) | ORAL | Status: DC | PRN
  Administered 2019-08-21: 10 mg via ORAL
  Filled 2019-08-18 (×2): qty 1

## 2019-08-18 MED ORDER — SUCCINYLCHOLINE CHLORIDE 20 MG/ML IJ SOLN
INTRAMUSCULAR | Status: AC
  Filled 2019-08-18: qty 1

## 2019-08-18 MED ORDER — PREGABALIN 75 MG PO CAPS
75.0000 mg | ORAL_CAPSULE | Freq: Two times a day (BID) | ORAL | Status: DC
  Administered 2019-08-18 – 2019-08-28 (×21): 75 mg via ORAL
  Filled 2019-08-18 (×21): qty 1

## 2019-08-18 MED ORDER — FAMOTIDINE 20 MG PO TABS
20.0000 mg | ORAL_TABLET | Freq: Once | ORAL | Status: AC
  Administered 2019-08-18: 20 mg via ORAL

## 2019-08-18 MED ORDER — DEXAMETHASONE SODIUM PHOSPHATE 10 MG/ML IJ SOLN
INTRAMUSCULAR | Status: DC | PRN
  Administered 2019-08-18: 10 mg via INTRAVENOUS

## 2019-08-18 MED ORDER — METHOCARBAMOL 500 MG PO TABS
500.0000 mg | ORAL_TABLET | Freq: Four times a day (QID) | ORAL | Status: DC | PRN
  Filled 2019-08-18: qty 1

## 2019-08-18 MED ORDER — LIDOCAINE HCL (PF) 1 % IJ SOLN
INTRAMUSCULAR | Status: DC | PRN
  Administered 2019-08-18: 3 mL

## 2019-08-18 MED ORDER — HYDROMORPHONE HCL 1 MG/ML IJ SOLN
INTRAMUSCULAR | Status: AC
  Filled 2019-08-18: qty 1

## 2019-08-18 MED ORDER — SODIUM CHLORIDE 0.9 % IV SOLN
2.0000 g | INTRAVENOUS | Status: AC
  Administered 2019-08-19: 2 g via INTRAVENOUS
  Filled 2019-08-18: qty 2

## 2019-08-18 MED ORDER — ACETAMINOPHEN 500 MG PO TABS
ORAL_TABLET | ORAL | Status: AC
  Filled 2019-08-18: qty 2

## 2019-08-18 MED ORDER — HEPARIN SODIUM (PORCINE) 5000 UNIT/ML IJ SOLN
5000.0000 [IU] | Freq: Three times a day (TID) | INTRAMUSCULAR | Status: DC
  Administered 2019-08-19 – 2019-08-28 (×28): 5000 [IU] via SUBCUTANEOUS
  Filled 2019-08-18 (×28): qty 1

## 2019-08-18 MED ORDER — ONDANSETRON 4 MG PO TBDP
4.0000 mg | ORAL_TABLET | Freq: Four times a day (QID) | ORAL | Status: DC | PRN
  Administered 2019-08-23: 4 mg via ORAL
  Filled 2019-08-18: qty 1

## 2019-08-18 MED ORDER — OXYCODONE HCL 5 MG PO TABS
5.0000 mg | ORAL_TABLET | ORAL | Status: DC | PRN
  Administered 2019-08-20: 5 mg via ORAL
  Administered 2019-08-20 – 2019-08-22 (×9): 10 mg via ORAL
  Administered 2019-08-23: 5 mg via ORAL
  Administered 2019-08-23 – 2019-08-28 (×9): 10 mg via ORAL
  Filled 2019-08-18: qty 2
  Filled 2019-08-18: qty 1
  Filled 2019-08-18 (×18): qty 2

## 2019-08-18 MED ORDER — HYDROMORPHONE HCL 1 MG/ML IJ SOLN
0.5000 mg | INTRAMUSCULAR | Status: DC | PRN
  Administered 2019-08-18 (×2): 0.5 mg via INTRAVENOUS

## 2019-08-18 MED ORDER — PRAVASTATIN SODIUM 20 MG PO TABS
10.0000 mg | ORAL_TABLET | Freq: Every day | ORAL | Status: DC
  Administered 2019-08-18 – 2019-08-21 (×4): 10 mg via ORAL
  Filled 2019-08-18 (×4): qty 1

## 2019-08-18 MED ORDER — MONTELUKAST SODIUM 10 MG PO TABS
10.0000 mg | ORAL_TABLET | Freq: Every day | ORAL | Status: DC
  Administered 2019-08-18 – 2019-08-27 (×10): 10 mg via ORAL
  Filled 2019-08-18 (×10): qty 1

## 2019-08-18 MED ORDER — HYDRALAZINE HCL 20 MG/ML IJ SOLN
10.0000 mg | INTRAMUSCULAR | Status: AC | PRN
  Administered 2019-08-23 – 2019-08-26 (×4): 10 mg via INTRAVENOUS
  Filled 2019-08-18 (×4): qty 1

## 2019-08-18 MED ORDER — LIDOCAINE HCL (CARDIAC) PF 100 MG/5ML IV SOSY
PREFILLED_SYRINGE | INTRAVENOUS | Status: DC | PRN
  Administered 2019-08-18: 80 mg via INTRAVENOUS

## 2019-08-18 MED ORDER — PROMETHAZINE HCL 25 MG/ML IJ SOLN
INTRAMUSCULAR | Status: AC
  Filled 2019-08-18: qty 1

## 2019-08-18 MED ORDER — ROCURONIUM BROMIDE 50 MG/5ML IV SOLN
INTRAVENOUS | Status: AC
  Filled 2019-08-18: qty 1

## 2019-08-18 MED ORDER — GABAPENTIN 300 MG PO CAPS
300.0000 mg | ORAL_CAPSULE | ORAL | Status: AC
  Administered 2019-08-18: 300 mg via ORAL

## 2019-08-18 MED ORDER — BENZONATATE 100 MG PO CAPS
200.0000 mg | ORAL_CAPSULE | Freq: Three times a day (TID) | ORAL | Status: DC | PRN
  Administered 2019-08-22 – 2019-08-27 (×2): 200 mg via ORAL
  Filled 2019-08-18 (×2): qty 2

## 2019-08-18 MED ORDER — HYDRALAZINE HCL 50 MG PO TABS
100.0000 mg | ORAL_TABLET | Freq: Three times a day (TID) | ORAL | Status: DC
  Administered 2019-08-18 – 2019-08-22 (×9): 100 mg via ORAL
  Filled 2019-08-18 (×12): qty 2

## 2019-08-18 MED ORDER — METOPROLOL TARTRATE 5 MG/5ML IV SOLN
INTRAVENOUS | Status: DC | PRN
  Administered 2019-08-18 (×2): 2 mg via INTRAVENOUS
  Administered 2019-08-18: 1 mg via INTRAVENOUS

## 2019-08-18 MED ORDER — ONDANSETRON HCL 4 MG/2ML IJ SOLN
INTRAMUSCULAR | Status: AC
  Filled 2019-08-18: qty 2

## 2019-08-18 MED ORDER — SERTRALINE HCL 50 MG PO TABS
200.0000 mg | ORAL_TABLET | Freq: Every day | ORAL | Status: DC
  Administered 2019-08-18 – 2019-08-27 (×10): 200 mg via ORAL
  Filled 2019-08-18 (×10): qty 4

## 2019-08-18 MED ORDER — PANTOPRAZOLE SODIUM 40 MG IV SOLR
40.0000 mg | Freq: Every day | INTRAVENOUS | Status: DC
  Administered 2019-08-18: 40 mg via INTRAVENOUS
  Filled 2019-08-18: qty 40

## 2019-08-18 MED ORDER — GABAPENTIN 300 MG PO CAPS: 300.0000 mg | ORAL_CAPSULE | ORAL | Status: DC

## 2019-08-18 MED ORDER — PHENYLEPHRINE HCL (PRESSORS) 10 MG/ML IV SOLN
INTRAVENOUS | Status: AC
  Filled 2019-08-18: qty 1

## 2019-08-18 MED ORDER — POTASSIUM CHLORIDE CRYS ER 20 MEQ PO TBCR
20.0000 meq | EXTENDED_RELEASE_TABLET | Freq: Every day | ORAL | Status: DC
  Administered 2019-08-18 – 2019-08-28 (×10): 20 meq via ORAL
  Filled 2019-08-18 (×10): qty 1

## 2019-08-18 MED ORDER — PROPOFOL 10 MG/ML IV BOLUS
INTRAVENOUS | Status: AC
  Filled 2019-08-18: qty 40

## 2019-08-18 MED ORDER — BUPIVACAINE HCL (PF) 0.25 % IJ SOLN
INTRAMUSCULAR | Status: AC
  Filled 2019-08-18: qty 60

## 2019-08-18 MED ORDER — ALBUTEROL SULFATE (2.5 MG/3ML) 0.083% IN NEBU
2.5000 mg | INHALATION_SOLUTION | Freq: Four times a day (QID) | RESPIRATORY_TRACT | Status: DC | PRN
  Administered 2019-08-20: 2.5 mg via RESPIRATORY_TRACT
  Filled 2019-08-18: qty 3

## 2019-08-18 MED ORDER — FENTANYL CITRATE (PF) 100 MCG/2ML IJ SOLN
INTRAMUSCULAR | Status: AC
  Filled 2019-08-18: qty 2

## 2019-08-18 MED ORDER — FLUTICASONE FUROATE-VILANTEROL 200-25 MCG/INH IN AEPB
1.0000 | INHALATION_SPRAY | RESPIRATORY_TRACT | Status: DC
  Administered 2019-08-18 – 2019-08-28 (×11): 1 via RESPIRATORY_TRACT
  Filled 2019-08-18: qty 28

## 2019-08-18 MED ORDER — LORATADINE 10 MG PO TABS
10.0000 mg | ORAL_TABLET | ORAL | Status: DC
  Administered 2019-08-19 – 2019-08-22 (×4): 10 mg via ORAL
  Filled 2019-08-18 (×4): qty 1

## 2019-08-18 MED ORDER — ONDANSETRON HCL 4 MG/2ML IJ SOLN
INTRAMUSCULAR | Status: DC | PRN
  Administered 2019-08-18: 4 mg via INTRAVENOUS

## 2019-08-18 MED ORDER — ROCURONIUM BROMIDE 100 MG/10ML IV SOLN
INTRAVENOUS | Status: DC | PRN
  Administered 2019-08-18: 50 mg via INTRAVENOUS

## 2019-08-18 MED ORDER — SODIUM CHLORIDE FLUSH 0.9 % IV SOLN
INTRAVENOUS | Status: AC
  Filled 2019-08-18: qty 10

## 2019-08-18 MED ORDER — KETOROLAC TROMETHAMINE 15 MG/ML IJ SOLN
15.0000 mg | Freq: Four times a day (QID) | INTRAMUSCULAR | Status: DC
  Administered 2019-08-18 – 2019-08-21 (×10): 15 mg via INTRAVENOUS
  Filled 2019-08-18 (×13): qty 1

## 2019-08-18 MED ORDER — BUPIVACAINE LIPOSOME 1.3 % IJ SUSP
INTRAMUSCULAR | Status: DC | PRN
  Administered 2019-08-18: 20 mL

## 2019-08-18 MED ORDER — OXYCODONE HCL 5 MG/5ML PO SOLN: 5.0000 mg | Freq: Once | ORAL | Status: DC | PRN

## 2019-08-18 MED ORDER — CHLORHEXIDINE GLUCONATE CLOTH 2 % EX PADS: 6.0000 | MEDICATED_PAD | Freq: Once | CUTANEOUS | Status: DC

## 2019-08-18 MED ORDER — PROMETHAZINE HCL 25 MG/ML IJ SOLN
6.2500 mg | INTRAMUSCULAR | Status: DC | PRN
  Administered 2019-08-18: 12.5 mg via INTRAVENOUS

## 2019-08-18 MED ORDER — GABAPENTIN 300 MG PO CAPS
ORAL_CAPSULE | ORAL | Status: AC
  Filled 2019-08-18: qty 1

## 2019-08-18 MED ORDER — FENTANYL CITRATE (PF) 100 MCG/2ML IJ SOLN
25.0000 ug | INTRAMUSCULAR | Status: DC | PRN
  Administered 2019-08-18 (×2): 50 ug via INTRAVENOUS

## 2019-08-18 MED ORDER — DEXMEDETOMIDINE HCL 200 MCG/2ML IV SOLN
INTRAVENOUS | Status: DC | PRN
  Administered 2019-08-18 (×3): 8 ug via INTRAVENOUS
  Administered 2019-08-18: 12 ug via INTRAVENOUS

## 2019-08-18 MED ORDER — LABETALOL HCL 5 MG/ML IV SOLN
10.0000 mg | INTRAVENOUS | Status: DC | PRN
  Administered 2019-08-18: 10 mg via INTRAVENOUS

## 2019-08-18 MED ORDER — SUGAMMADEX SODIUM 200 MG/2ML IV SOLN
INTRAVENOUS | Status: DC | PRN
  Administered 2019-08-18: 150 mg via INTRAVENOUS

## 2019-08-18 MED ORDER — METOPROLOL SUCCINATE ER 50 MG PO TB24
50.0000 mg | ORAL_TABLET | Freq: Every day | ORAL | Status: DC
  Administered 2019-08-22 – 2019-08-23 (×2): 50 mg via ORAL
  Filled 2019-08-18 (×4): qty 1

## 2019-08-18 MED ORDER — ACETAMINOPHEN 500 MG PO TABS: 1000.0000 mg | ORAL_TABLET | ORAL | Status: DC

## 2019-08-18 MED ORDER — SODIUM CHLORIDE 0.9 % IV SOLN
INTRAVENOUS | Status: DC
  Administered 2019-08-18: 16:00:00 via INTRAVENOUS

## 2019-08-18 MED ORDER — MEPERIDINE HCL 50 MG/ML IJ SOLN: 6.2500 mg | INTRAMUSCULAR | Status: DC | PRN

## 2019-08-18 MED ORDER — OXYCODONE HCL 5 MG PO TABS: 5.0000 mg | ORAL_TABLET | Freq: Once | ORAL | Status: DC | PRN

## 2019-08-18 MED ORDER — PROPOFOL 10 MG/ML IV BOLUS
INTRAVENOUS | Status: DC | PRN
  Administered 2019-08-18: 40 mg via INTRAVENOUS
  Administered 2019-08-18: 120 mg via INTRAVENOUS

## 2019-08-18 MED ORDER — MECLIZINE HCL 12.5 MG PO TABS
12.5000 mg | ORAL_TABLET | Freq: Three times a day (TID) | ORAL | Status: DC | PRN
  Filled 2019-08-18: qty 1

## 2019-08-18 MED ORDER — FENTANYL CITRATE (PF) 100 MCG/2ML IJ SOLN
INTRAMUSCULAR | Status: DC | PRN
  Administered 2019-08-18: 25 ug via INTRAVENOUS
  Administered 2019-08-18: 50 ug via INTRAVENOUS
  Administered 2019-08-18: 25 ug via INTRAVENOUS
  Administered 2019-08-18: 50 ug via INTRAVENOUS
  Administered 2019-08-18 (×2): 25 ug via INTRAVENOUS

## 2019-08-18 MED ORDER — SODIUM CHLORIDE (PF) 0.9 % IJ SOLN
INTRAMUSCULAR | Status: DC | PRN
  Administered 2019-08-18: 50 mL

## 2019-08-18 MED ORDER — METOPROLOL TARTRATE 5 MG/5ML IV SOLN
INTRAVENOUS | Status: AC
  Filled 2019-08-18: qty 5

## 2019-08-18 MED ORDER — MIRTAZAPINE 15 MG PO TABS
15.0000 mg | ORAL_TABLET | Freq: Every day | ORAL | Status: DC
  Administered 2019-08-18 – 2019-08-27 (×10): 15 mg via ORAL
  Filled 2019-08-18 (×10): qty 1

## 2019-08-18 MED ORDER — ACETAMINOPHEN 500 MG PO TABS
1000.0000 mg | ORAL_TABLET | Freq: Four times a day (QID) | ORAL | Status: DC
  Administered 2019-08-18 – 2019-08-22 (×14): 1000 mg via ORAL
  Filled 2019-08-18 (×15): qty 2

## 2019-08-18 MED ORDER — TERAZOSIN HCL 1 MG PO CAPS
1.0000 mg | ORAL_CAPSULE | Freq: Every day | ORAL | Status: DC
  Administered 2019-08-18 – 2019-08-27 (×9): 1 mg via ORAL
  Filled 2019-08-18 (×11): qty 1

## 2019-08-18 MED ORDER — BUPIVACAINE-EPINEPHRINE 0.25% -1:200000 IJ SOLN
INTRAMUSCULAR | Status: DC | PRN
  Administered 2019-08-18: 3 mL
  Administered 2019-08-18: 30 mL

## 2019-08-18 MED ORDER — PROCHLORPERAZINE MALEATE 10 MG PO TABS
10.0000 mg | ORAL_TABLET | Freq: Four times a day (QID) | ORAL | Status: DC | PRN
  Filled 2019-08-18: qty 1

## 2019-08-18 MED ORDER — TIZANIDINE HCL 4 MG PO TABS
4.0000 mg | ORAL_TABLET | Freq: Two times a day (BID) | ORAL | Status: DC
  Administered 2019-08-18 – 2019-08-22 (×9): 4 mg via ORAL
  Filled 2019-08-18 (×10): qty 1

## 2019-08-18 MED ORDER — LABETALOL HCL 5 MG/ML IV SOLN
INTRAVENOUS | Status: AC
  Filled 2019-08-18: qty 4

## 2019-08-18 MED ORDER — PROCHLORPERAZINE EDISYLATE 10 MG/2ML IJ SOLN
5.0000 mg | Freq: Four times a day (QID) | INTRAMUSCULAR | Status: DC | PRN
  Filled 2019-08-18: qty 2

## 2019-08-18 MED ORDER — ONDANSETRON HCL 4 MG/2ML IJ SOLN: 4.0000 mg | Freq: Four times a day (QID) | INTRAMUSCULAR | Status: DC | PRN

## 2019-08-18 MED ORDER — ACETAMINOPHEN 500 MG PO TABS
1000.0000 mg | ORAL_TABLET | ORAL | Status: AC
  Administered 2019-08-18: 1000 mg via ORAL

## 2019-08-18 SURGICAL SUPPLY — 70 items
ADH SKN CLS APL DERMABOND .7 (GAUZE/BANDAGES/DRESSINGS) ×4
APL PRP STRL LF DISP 70% ISPRP (MISCELLANEOUS) ×2
APPLIER CLIP 11 MED OPEN (CLIP)
APPLIER CLIP 13 LRG OPEN (CLIP)
APR CLP LRG 13 20 CLIP (CLIP)
APR CLP MED 11 20 MLT OPN (CLIP)
BAG BIOHAZARD 6X9 CLR ZIPLOCK (MISCELLANEOUS) ×4 IMPLANT
BAG SPEC THK2 9X6 BHZR CLR (MISCELLANEOUS) ×2
BLADE CLIPPER SURG (BLADE) ×2 IMPLANT
BLADE SURG 15 STRL LF DISP TIS (BLADE) ×2 IMPLANT
BLADE SURG 15 STRL SS (BLADE) ×4
BLADE SURG SZ11 CARB STEEL (BLADE) ×4 IMPLANT
BNDG CONFORM 2 STRL LF (GAUZE/BANDAGES/DRESSINGS) ×4 IMPLANT
BRUSH SCRUB EZ  4% CHG (MISCELLANEOUS)
BRUSH SCRUB EZ 4% CHG (MISCELLANEOUS) ×2 IMPLANT
CANISTER SUCT 1200ML W/VALVE (MISCELLANEOUS) ×4 IMPLANT
CATH URET ROBINSON 16FR STRL (CATHETERS) ×2 IMPLANT
CHLORAPREP W/TINT 26 (MISCELLANEOUS) ×4 IMPLANT
CLIP APPLIE 11 MED OPEN (CLIP) IMPLANT
CLIP APPLIE 13 LRG OPEN (CLIP) IMPLANT
CNTNR SPEC 2.5X3XGRAD LEK (MISCELLANEOUS) ×2
CONT SPEC 4OZ STER OR WHT (MISCELLANEOUS) ×2
CONT SPEC 4OZ STRL OR WHT (MISCELLANEOUS) ×2
CONTAINER SPEC 2.5X3XGRAD LEK (MISCELLANEOUS) IMPLANT
COVER WAND RF STERILE (DRAPES) ×4 IMPLANT
DERMABOND ADVANCED (GAUZE/BANDAGES/DRESSINGS) ×4
DERMABOND ADVANCED .7 DNX12 (GAUZE/BANDAGES/DRESSINGS) ×2 IMPLANT
DRAPE LAPAROTOMY 100X77 ABD (DRAPES) ×4 IMPLANT
DRAPE LAPAROTOMY 77X122 PED (DRAPES) ×2 IMPLANT
DRSG OPSITE POSTOP 4X6 (GAUZE/BANDAGES/DRESSINGS) ×4 IMPLANT
DRSG TELFA 3X8 NADH (GAUZE/BANDAGES/DRESSINGS) IMPLANT
DRSG TELFA 4X3 1S NADH ST (GAUZE/BANDAGES/DRESSINGS) ×2 IMPLANT
ELECT BLADE 6.5 EXT (BLADE) ×4 IMPLANT
ELECT CAUTERY BLADE 6.4 (BLADE) ×4 IMPLANT
ELECT REM PT RETURN 9FT ADLT (ELECTROSURGICAL) ×4
ELECTRODE REM PT RTRN 9FT ADLT (ELECTROSURGICAL) ×2 IMPLANT
GAUZE SPONGE 4X4 12PLY STRL (GAUZE/BANDAGES/DRESSINGS) ×4 IMPLANT
GLOVE BIO SURGEON STRL SZ7 (GLOVE) ×8 IMPLANT
GOWN STRL REUS W/ TWL LRG LVL3 (GOWN DISPOSABLE) ×8 IMPLANT
GOWN STRL REUS W/TWL LRG LVL3 (GOWN DISPOSABLE) ×24
HANDLE SUCTION POOLE (INSTRUMENTS) ×2 IMPLANT
LIGASURE IMPACT 36 18CM CVD LR (INSTRUMENTS) ×2 IMPLANT
NDL HYPO 25X1 1.5 SAFETY (NEEDLE) ×2 IMPLANT
NEEDLE HYPO 22GX1.5 SAFETY (NEEDLE) ×4 IMPLANT
NEEDLE HYPO 25X1 1.5 SAFETY (NEEDLE) ×4 IMPLANT
NS IRRIG 1000ML POUR BTL (IV SOLUTION) ×4 IMPLANT
NS IRRIG 500ML POUR BTL (IV SOLUTION) ×4 IMPLANT
PACK BASIN MAJOR ARMC (MISCELLANEOUS) ×4 IMPLANT
PACK BASIN MINOR ARMC (MISCELLANEOUS) ×2 IMPLANT
PACK COLON CLEAN CLOSURE (MISCELLANEOUS) ×4 IMPLANT
PAD DRESSING TELFA 3X8 NADH (GAUZE/BANDAGES/DRESSINGS) ×2 IMPLANT
PLEDGET CV PTFE 7X3 (MISCELLANEOUS) IMPLANT
RELOAD PROXIMATE 75MM BLUE (ENDOMECHANICALS) ×8 IMPLANT
RELOAD STAPLE 75 3.8 BLU REG (ENDOMECHANICALS) IMPLANT
SPONGE LAP 18X18 RF (DISPOSABLE) ×8 IMPLANT
STAPLER PROXIMATE 75MM BLUE (STAPLE) ×2 IMPLANT
STAPLER SKIN PROX 35W (STAPLE) ×4 IMPLANT
SUCTION POOLE HANDLE (INSTRUMENTS)
SUT MNCRL AB 4-0 PS2 18 (SUTURE) ×4 IMPLANT
SUT PDS PLUS 0 (SUTURE) ×4
SUT PDS PLUS AB 0 CT-2 (SUTURE) ×8 IMPLANT
SUT SILK 2 0 SH CR/8 (SUTURE) ×6 IMPLANT
SUT SILK 2 0SH CR/8 30 (SUTURE) ×6 IMPLANT
SUT VIC AB 2-0 SH 27 (SUTURE) ×4
SUT VIC AB 2-0 SH 27XBRD (SUTURE) IMPLANT
SUT VIC AB 3-0 SH 27 (SUTURE) ×4
SUT VIC AB 3-0 SH 27X BRD (SUTURE) ×2 IMPLANT
SYR 20ML LL LF (SYRINGE) ×8 IMPLANT
SYRINGE IRR TOOMEY STRL 70CC (SYRINGE) ×2 IMPLANT
TRAY FOLEY MTR SLVR 16FR STAT (SET/KITS/TRAYS/PACK) ×2 IMPLANT

## 2019-08-18 NOTE — Progress Notes (Signed)
Patient complains of nausea and feeling sick on her stomach.  DR. Randa Lynn aware of elevated bp, meds ordered and Given as directed.

## 2019-08-18 NOTE — Interval H&P Note (Signed)
History and Physical Interval Note:  08/18/2019 9:09 AM  Gloria Allen  has presented today for surgery, with the diagnosis of Colon Cancer.  The various methods of treatment have been discussed with the patient and family. After consideration of risks, benefits and other options for treatment, the patient has consented to  Procedure(s): ILEOSTOMY TAKEDOWN (N/A) REMOVAL PORT-A-CATH (N/A) as a surgical intervention.  The patient's history has been reviewed, patient examined, no change in status, stable for surgery.  I have reviewed the patient's chart and labs.  Questions were answered to the patient's satisfaction.     [place]

## 2019-08-18 NOTE — Anesthesia Post-op Follow-up Note (Signed)
Anesthesia QCDR form completed.        

## 2019-08-18 NOTE — Anesthesia Preprocedure Evaluation (Signed)
Anesthesia Evaluation  Patient identified by MRN, date of birth, ID band Patient awake    Reviewed: Allergy & Precautions, NPO status , Patient's Chart, lab work & pertinent test results  History of Anesthesia Complications Negative for: history of anesthetic complications  Airway Mallampati: II  TM Distance: >3 FB Neck ROM: Full    Dental  (+) Edentulous Upper, Edentulous Lower   Pulmonary asthma , neg sleep apnea, former smoker,    breath sounds clear to auscultation- rhonchi (-) wheezing      Cardiovascular hypertension, Pt. on medications (-) CAD, (-) Past MI, (-) Cardiac Stents and (-) CABG  Rhythm:Regular Rate:Normal - Systolic murmurs and - Diastolic murmurs    Neuro/Psych  Headaches, neg Seizures PSYCHIATRIC DISORDERS Anxiety Depression    GI/Hepatic Neg liver ROS, GERD  ,  Endo/Other  negative endocrine ROSneg diabetes  Renal/GU negative Renal ROS     Musculoskeletal  (+) Arthritis ,   Abdominal (+) - obese,   Peds  Hematology negative hematology ROS (+)   Anesthesia Other Findings Past Medical History: No date: Anxiety No date: Arthritis No date: Asthma 07/30/2017: Cervical central spinal stenosis (C3-C7) (worse at C4-5) 07/30/2017: Cervical foraminal stenosis (C4-5 and C5-6) (Bilateral)  (L>R) 06/11/2017: Chronic lower extremity pain (Fourth Area of Pain)  (Bilateral) (R>L) 06/11/2017: Chronic neck pain (Primary Area of Pain) (Bilateral) (R>L) 06/11/2017: Chronic sacroiliac joint pain (Bilateral) (R>L) 06/11/2017: Chronic shoulder pain (Tertiary Area of Pain) (Right) 06/11/2017: Chronic upper extremity pain (Fifth Area of Pain)  (Bilateral) (R>L) 11/2018: Colon cancer ([place])     Comment:  Partial colon resection 07/30/2017: DDD (degenerative disc disease), cervical 07/30/2017: DDD (degenerative disc disease), lumbar No date: Depression 07/30/2017: DISH (diffuse idiopathic skeletal hyperostosis) No  date: Dyspnea     Comment:  with exertion 06/20/2017: Entrapment syndrome     Comment:  2002 on R and 2010 on L 06/12/2017: Full thickness rotator cuff tear No date: GERD (gastroesophageal reflux disease) 07/30/2017: Grade 1 Anterolisthesis of L4 over L5 No date: Headache No date: Hx: UTI (urinary tract infection) No date: Hypertension 01/15/2017: Inflammation of joint of shoulder region 07/30/2017: Lumbar central spinal stenosis (L4-5) 07/30/2017: Lumbar disc protrusion (Left: L5-S1) (Right: L1-2)     Comment:  L5-S1 left foraminal protrusion with L5 impingement.               L1-2 right paracentral protrusion without impingement. 07/30/2017: Lumbar facet arthropathy (Bilateral) 07/30/2017: Lumbar facet syndrome (Bilateral) (R>L) 11/2018: Lung cancer ([facility])     Comment:  Chemo  and rad tx's 01/15/2017: Osteoarthritis of shoulder (Right)   Reproductive/Obstetrics                             Anesthesia Physical Anesthesia Plan  ASA: III  Anesthesia Plan: General   Post-op Pain Management:    Induction: Intravenous  PONV Risk Score and Plan: 2 and Ondansetron and Dexamethasone  Airway Management Planned: Oral ETT  Additional Equipment:   Intra-op Plan:   Post-operative Plan: Extubation in OR  Informed Consent: I have reviewed the patients History and Physical, chart, labs and discussed the procedure including the risks, benefits and alternatives for the proposed anesthesia with the patient or authorized representative who has indicated his/her understanding and acceptance.     Dental advisory given  Plan Discussed with: CRNA and Anesthesiologist  Anesthesia Plan Comments:         Anesthesia Quick Evaluation

## 2019-08-18 NOTE — Op Note (Signed)
PROCEDURES: 1. Removal of right IJ port-a-cath 2. Ileostomy takedown 3. Repair of parastomal hernia  Pre-operative Diagnosis: Hx sigmoid CA and lung CA  Post-operative Diagnosis:   Surgeon: Marjory Lies Timmie Calix   Assistants: Dr. Genevive Bi required due to the complexity of the procedure and for exposure and anstomosis  Anesthesia: General endotracheal anesthesia  ASA Class: 2   Surgeon: Caroleen Hamman , MD FACS  Anesthesia: Gen. with endotracheal tube  Findings: Dense adhesions from the SB to abd wall, parastomal hernia Patent anastomosis w/o evidence of intra-op leak   Estimated Blood Loss: 24MW           Complications: none                Condition: stable  Procedure Details  The patient was seen again in the Holding Room. The benefits, complications, treatment options, and expected outcomes were discussed with the patient. The risks of bleeding, infection, recurrence of symptoms, failure to resolve symptoms,  bowel injury, any of which could require further surgery were reviewed with the patient.   The patient was taken to Operating Room, identified as Gloria Allen and the procedure verified.  A Time Out was held and the above information confirmed.  Prior to the induction of general anesthesia, antibiotic prophylaxis was administered. VTE prophylaxis was in place. General endotracheal anesthesia was then administered and tolerated well. After the induction, the  chest was prepped with Chloraprep and draped in the sterile fashion. The patient was positioned in the supine position. I started with a right Port-A-Cath and chest wall incision was created with a 15 blade knife.  Were able to identify the catheter and its capsule.  We opened the capsule and remove the Prolene stitches.  After performing a Valsalva maneuver I went ahead and pulled the Port-A-Cath on pressure for a couple minutes for adequate hemostasis.  The wound was closed in a 2 layer fashion with a 3-0 Vicryl and a 4-0  Monocryl.  Dermabond was applied.  Attention then was turned to the abdominal cavity where the ileostomy bag was removed and a 2-0 silk was used to close the ileostomy.  We prepped and draped in the usual sterile fashion with Betadine.  Optical incision was performed incorporating the ileostomy site.  Electrocautery was used to dissect through subcutaneous tissue.  We encounter a moderate size parastomal hernia.  Meticulous dissection was performed to take down the adhesions and to be able to identify the hernia sac and dissected free from the small bowel.  Were able to finally free up the small bowel after a good 30 to 45 minutes of lysis of adhesions and dissection of the intraconal structures from the hernia sac.  Using a GIA stapler we divided the loop ileostomy at in the standard fashion.  We created a side-to-side functional end-to-end staple anastomosis with a 75 GIA stapler and the defect was closed with multiple 2 oh silks in the standard fashion.  There was no evidence of any stricture and there was good perfusion without evidence of any leak from the anastomosis.  Were able to push back the small bowel into the abdominal cavity and closed the hernia defect in a 2 layer fashion with continuous 0 PDS suture.  Please also note that liposomal Marcaine was injected throughout the abdominal wall for postoperative analgesia.  The skin was closed with staples.  Needle and laparotomy count were correct and there were no immediate complications.  Caroleen Hamman, MD, FACS

## 2019-08-18 NOTE — Progress Notes (Signed)
Patient resting comfortably with eyes closed, patient is more relaxed at this time.

## 2019-08-18 NOTE — Progress Notes (Signed)
15 minute call to floor. 

## 2019-08-18 NOTE — Transfer of Care (Signed)
Immediate Anesthesia Transfer of Care Note  Patient: Gloria Allen  Procedure(s) Performed: ILEOSTOMY TAKEDOWN (N/A Abdomen) REMOVAL PORT-A-CATH (Right Chest)  Patient Location: PACU  Anesthesia Type:General  Level of Consciousness: sedated and drowsy  Airway & Oxygen Therapy: Patient Spontanous Breathing and Patient connected to face mask oxygen  Post-op Assessment: Report given to RN and Post -op Vital signs reviewed and stable  Post vital signs: Reviewed and stable  Last Vitals:  Vitals Value Taken Time  BP 188/83 08/18/19 1243  Temp    Pulse 81 08/18/19 1247  Resp 0 08/18/19 1246  SpO2 100 % 08/18/19 1247  Vitals shown include unvalidated device data.  Last Pain:  Vitals:   08/18/19 0931  TempSrc: Temporal  PainSc: 8          Complications: No apparent anesthesia complications

## 2019-08-18 NOTE — Anesthesia Procedure Notes (Signed)
Procedure Name: Intubation Date/Time: 08/18/2019 10:17 AM Performed by: Leeroy Cha, CRNA Pre-anesthesia Checklist: Patient identified, Emergency Drugs available, Suction available and Patient being monitored Patient Re-evaluated:Patient Re-evaluated prior to induction Oxygen Delivery Method: Circle system utilized Preoxygenation: Pre-oxygenation with 100% oxygen Induction Type: IV induction Ventilation: Mask ventilation without difficulty Laryngoscope Size: McGraph and 3 Grade View: Grade I Tube type: Oral Tube size: 7.0 mm Number of attempts: 1 Airway Equipment and Method: Stylet and Oral airway Placement Confirmation: ETT inserted through vocal cords under direct vision,  positive ETCO2 and breath sounds checked- equal and bilateral Secured at: 21 cm Tube secured with: Tape Dental Injury: Teeth and Oropharynx as per pre-operative assessment

## 2019-08-18 NOTE — Anesthesia Postprocedure Evaluation (Signed)
Anesthesia Post Note  Patient: Gloria Allen  Procedure(s) Performed: ILEOSTOMY TAKEDOWN (N/A Abdomen) REMOVAL PORT-A-CATH (Right Chest)  Patient location during evaluation: PACU Anesthesia Type: General Level of consciousness: awake and alert and oriented Pain management: pain level controlled Vital Signs Assessment: post-procedure vital signs reviewed and stable Respiratory status: spontaneous breathing, nonlabored ventilation and respiratory function stable Cardiovascular status: blood pressure returned to baseline and stable Postop Assessment: no signs of nausea or vomiting Anesthetic complications: no     Last Vitals:  Vitals:   08/18/19 1343 08/18/19 1356  BP: (!) 164/75 (!) 145/78  Pulse: 68 75  Resp: 11 14  Temp:    SpO2: 100% 99%    Last Pain:  Vitals:   08/18/19 1356  TempSrc:   PainSc: 6                  Duey Liller

## 2019-08-19 ENCOUNTER — Encounter: Payer: Self-pay | Admitting: Surgery

## 2019-08-19 LAB — BASIC METABOLIC PANEL
Anion gap: 9 (ref 5–15)
BUN: 25 mg/dL — ABNORMAL HIGH (ref 8–23)
CO2: 21 mmol/L — ABNORMAL LOW (ref 22–32)
Calcium: 8.8 mg/dL — ABNORMAL LOW (ref 8.9–10.3)
Chloride: 108 mmol/L (ref 98–111)
Creatinine, Ser: 1.11 mg/dL — ABNORMAL HIGH (ref 0.44–1.00)
GFR calc Af Amer: 58 mL/min — ABNORMAL LOW (ref >60)
GFR calc non Af Amer: 50 mL/min — ABNORMAL LOW (ref >60)
Glucose, Bld: 146 mg/dL — ABNORMAL HIGH (ref 70–99)
Potassium: 4.8 mmol/L (ref 3.5–5.1)
Sodium: 138 mmol/L (ref 135–145)

## 2019-08-19 LAB — CBC
HCT: 35 % — ABNORMAL LOW (ref 36.0–46.0)
Hemoglobin: 11.2 g/dL — ABNORMAL LOW (ref 12.0–15.0)
MCH: 28.6 pg (ref 26.0–34.0)
MCHC: 32 g/dL (ref 30.0–36.0)
MCV: 89.5 fL (ref 80.0–100.0)
Platelets: 219 10*3/uL (ref 150–400)
RBC: 3.91 MIL/uL (ref 3.87–5.11)
RDW: 13.1 % (ref 11.5–15.5)
WBC: 17.6 10*3/uL — ABNORMAL HIGH (ref 4.0–10.5)
nRBC: 0 % (ref 0.0–0.2)

## 2019-08-19 LAB — SURGICAL PATHOLOGY

## 2019-08-19 MED ORDER — PANTOPRAZOLE SODIUM 40 MG PO TBEC
40.0000 mg | DELAYED_RELEASE_TABLET | Freq: Every day | ORAL | Status: DC
  Administered 2019-08-19 – 2019-08-27 (×9): 40 mg via ORAL
  Filled 2019-08-19 (×9): qty 1

## 2019-08-19 MED ORDER — SODIUM CHLORIDE 0.9 % IV BOLUS
1000.0000 mL | Freq: Once | INTRAVENOUS | Status: AC
  Administered 2019-08-19: 1000 mL via INTRAVENOUS

## 2019-08-19 NOTE — TOC Initial Note (Signed)
Transition of Care [facility]) - Initial/Assessment Note    Patient Details  Name: Gloria Allen MRN: [id number] Date of Birth: 03-Jul-1946  [facility] CM/SW Contact:    Beverly Sessions, RN Phone Number: 08/19/2019, 3:09 PM  Clinical Narrative:                  Patient was admitted from home. Status of S/P closure of ileostomy. Patient lives alone. She does have children that live locally but are not involved in here care. Patient does receive outpatient palliative, Santiago Glad with [facility]. Santiago Glad is aware of admission. Patient open with [facility] for nursing and PT. Will add SW for emotional support at discharge. Patient's PCP is: [facility]. Patient's pharmacy is with [facility]. Pt denies issues containing medications. Patient states that when she comes to Cancer center they provide transportation to and from her home. Patient stated that for her other appointments she will either drive herself or she will find someone to drive her to these said appointments.  She is current on her rent, but is not up to date on this current month. Resources were provided for possible financial assistance for housing.   Expected Discharge Plan: [place] Barriers to Discharge: Continued Medical Work up   Patient Goals and CMS Choice Patient states their goals for this hospitalization and ongoing recovery are:: "discharge home tomorrow" CMS Medicare.gov Compare Post Acute Care list provided to:: Patient Choice offered to / list presented to : Patient  Expected Discharge Plan and Services Expected Discharge Plan: [place] In-house Referral: Clinical Social Work Discharge Planning Services: CM Consult Post Acute Care Choice: [place] arrangements for the past 2 months: Single Family Home                           HH Arranged: RN, PT, Social Work [employer] Agency: [place] Date [place]: 08/19/19 Time [place]: [place] Representative spoke with at [place]: Malachy Mood  Prior Living Arrangements/Services Living arrangements for the past 2 months: [place] Lives with:: Self Patient language and need for interpreter reviewed:: Yes Do you feel safe going back to the place where you live?: Yes      Need for Family Participation in Patient Care: No (Comment) Care giver support system in place?: No (comment) Current home services: Home PT, Home RN, Other (comment)(outpatient pallative) Criminal Activity/Legal Involvement Pertinent to Current Situation/Hospitalization: No - Comment as needed  Activities of Daily [place] Devices/Equipment: Environmental consultant (specify type), Shower chair with back, Cane (specify quad or straight), Wheelchair ADL Screening (condition at time of admission) Patient's cognitive ability adequate to safely complete daily activities?: Yes Is the patient deaf or have difficulty hearing?: No Does the patient have difficulty seeing, even when wearing glasses/contacts?: No Does the patient have difficulty concentrating, remembering, or making decisions?: No Patient able to express need for assistance with ADLs?: Yes Does the patient have difficulty dressing or bathing?: No Independently performs ADLs?: Yes (appropriate for developmental age) Does the patient have difficulty walking or climbing stairs?: No Weakness of Legs: Both Weakness of Arms/Hands: None  Permission Sought/Granted                  Emotional Assessment Appearance:: Appears stated age Attitude/Demeanor/Rapport: Engaged, Charismatic, Gracious Affect (typically observed): Accepting, Adaptable Orientation: : Oriented to Self, Oriented to Place, Oriented to  Time, Oriented to Situation   Psych Involvement: No (comment)  Admission diagnosis:  Colon Cancer Patient Active Problem List   Diagnosis Date Noted  . S/P closure of ileostomy 08/18/2019  .  Chemotherapy-induced nausea 04/15/2019  . Hypokalemia 04/15/2019  . Blood in stool   . Sepsis ([place]) 03/25/2019  . Essential hypertension 03/06/2019  . Hyperlipidemia 03/06/2019  . Small cell lung cancer ([place]) 02/24/2019  . Goals of care, counseling/discussion 12/19/2018  . Malignant neoplasm of colon ([place]) 12/19/2018  . S/P laparoscopic colectomy 11/27/2018  . Closed bimalleolar fracture of right ankle 11/24/2018  . Ankylosing spondylitis of multiple sites in spine ([place]) 11/21/2018  . Severe episode of recurrent major depressive disorder, without psychotic features ([place]) 11/21/2018  . Ankle fracture, right 11/21/2018  . Benign neoplasm of ascending colon   . Malignant neoplasm of sigmoid colon ([place])   . Melanosis, colon   . Special screening for malignant neoplasms, colon   . Maculopapular rash   . Colon neoplasm   . Melena   . Columnar epithelial-lined lower esophagus   . Stomach irritation   . IFG (impaired fasting glucose) 04/22/2018  . Ataxia 03/04/2018  . Atypical chest pain 03/04/2018  . SOB (shortness of breath) 01/26/2018  . Laryngopharyngeal reflux (LPR) 08/16/2017  . Suspected exposure to mold 08/05/2017  . Poor historian 08/05/2017  . Lumbar foraminal stenosis (Bilateral: L4-5) (Left: L5-S1) 07/30/2017  . Osteopenia of spine 07/30/2017  . PTSD (post-traumatic stress disorder) 06/20/2017  . Cervical arthritis 06/20/2017  . Bronchial asthma 06/20/2017  . Long term current use of opiate analgesic 06/11/2017  . Chronic pain syndrome 06/11/2017  . Anxiety   . Depression   . GERD (gastroesophageal reflux disease)   . Benign hypertensive renal disease    PCP:  Inc, [place]:   [facility] [place], [place] - [address] [place] [address] Phone: [phone] Fax: [place] [address]), [place] [address] [place] [address] Phone: [phone] Fax: [phone]     Social Determinants of Health (SDOH) Interventions    Readmission Risk Interventions Readmission Risk Prevention Plan 08/19/2019  Transportation Screening Complete  HRI or Home Care Consult Complete  Social Work Consult for [place] Planning/Counseling Complete  Palliative Care Screening Complete  Medication Review Press photographer) Complete  Some encounter information is confidential and restricted. Go to Review Flowsheets activity to see all data.  Some recent data might be hidden

## 2019-08-19 NOTE — Progress Notes (Signed)
Patient is currently followed by [facility] community Palliative program at home. CMRN Stephanie Bowen made aware. °Karen Robertson BSN, RN, CHPN °Hospital Liaison °[facility] °[phone] °

## 2019-08-19 NOTE — Progress Notes (Signed)
PHARMACIST - PHYSICIAN COMMUNICATION  DR:   Dahlia Byes  CONCERNING: IV to Oral Route Change Policy  RECOMMENDATION: This patient is receiving pantoprazole by the intravenous route.  Based on criteria approved by the Pharmacy and Therapeutics Committee, the intravenous medication(s) is/are being converted to the equivalent oral dose form(s).   DESCRIPTION: These criteria include:  The patient is eating (either orally or via tube) and/or has been taking other orally administered medications for a least 24 hours  The patient has no evidence of active gastrointestinal bleeding or impaired GI absorption (gastrectomy, short bowel, patient on TNA or NPO).  If you have questions about this conversion, please contact the Pharmacy Department  []   [phone] )  [name] [x]   [phone] )  [name] []   [phone] )  [name] []   [phone] )  [name] []   [phone] )  [name], [name] 08/19/2019 1:45 PM

## 2019-08-19 NOTE — Progress Notes (Signed)
[place] Hospital Day(s): 1.   Post op day(s): 1 Day Post-Op.   Interval History:  Patient seen and examined no acute events or new complaints overnight.  Patient reports she has incisional soreness at her previous ileostomy site. No fever, chills, nausea, or emesis Slight elevation in renal function, getting IVF  Leukocytosis 17K (Improved from yesterday) + flatus, + BM recorded on chart On CLD and tolerating well.  Has not mobilized yet  Vital signs in last 24 hours: [min-max] current  Temp:  [96.7 F (35.9 C)-99 F (37.2 C)] 97.5 F (36.4 C) (11/18 0511) Pulse Rate:  [57-100] 57 (11/18 0511) Resp:  [0-34] 20 (11/18 0511) BP: (97-190)/(52-91) 97/52 (11/18 0511) SpO2:  [98 %-100 %] 99 % (11/18 0511) Weight:  [70.9 kg] 70.9 kg (11/17 0931)     Height: 5\' 5"  (165.1 cm) Weight: 70.9 kg BMI (Calculated): 26.01   Intake/Output last 2 shifts:  11/17 0701 - 11/18 0700 In: 1775.6 [I.V.:1688.8; IV Piggyback:86.8] Out: 5 [Blood:5]   Physical Exam:  Constitutional: alert, cooperative and no distress  Respiratory: breathing non-labored at rest  Cardiovascular: regular rate and sinus rhythm  Gastrointestinal: soft, incisional tenderness, and non-distended. No rebound or guarding Integumentary: Previous ileostomy incision site is CDI with staples, honey comb in place  Labs:  CBC Latest Ref Rng & Units 08/19/2019 08/18/2019 07/09/2019  WBC 4.0 - 10.5 K/uL 17.6(H) 20.1(H) 5.3  Hemoglobin 12.0 - 15.0 g/dL 11.2(L) 13.7 12.0  Hematocrit 36.0 - 46.0 % 35.0(L) 41.4 37.4  Platelets 150 - 400 K/uL 219 251 218   CMP Latest Ref Rng & Units 08/19/2019 08/18/2019 07/09/2019  Glucose 70 - 99 mg/dL 146(H) - 185(H)  BUN 8 - 23 mg/dL 25(H) - 10  Creatinine 0.44 - 1.00 mg/dL 1.11(H) 0.87 0.64  Sodium 135 - 145 mmol/L 138 - 140  Potassium 3.5 - 5.1 mmol/L 4.8 - 3.7  Chloride 98 - 111 mmol/L 108 - 101  CO2 22 - 32 mmol/L 21(L) - 26  Calcium 8.9 - 10.3  mg/dL 8.8(L) - 9.8  Total Protein 6.5 - 8.1 g/dL - - 7.9  Total Bilirubin 0.3 - 1.2 mg/dL - - 0.3  Alkaline Phos 38 - 126 U/L - - 102  AST 15 - 41 U/L - - 23  ALT 0 - 44 U/L - - 14    Imaging studies: No new pertinent imaging studies   Assessment/Plan:  73 y.o. 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   - Advance to full liquid diet this morning; if tolerates okay to advance to carb modified diet    - IVF Bolus  - pain control prn; antiemetics prn  - Monitor abdominal examination; on-going bowel function    - mobilization encouraged  - medical management of comorbid condiitions  - DVT prophylaxis    - discharge planning: Hopefully home in next 24-48 hours  All of the above findings and recommendations were discussed with the patient, and the medical team, and all of patient's questions were answered to her expressed satisfaction.  -- Edison Simon, PA-C [facility] 08/19/2019, 7:27 AM [phone] M-F: 7am - 4pm

## 2019-08-20 ENCOUNTER — Inpatient Hospital Stay: Payer: Medicare Other

## 2019-08-20 DIAGNOSIS — I951 Orthostatic hypotension: Secondary | ICD-10-CM

## 2019-08-20 LAB — GLUCOSE, CAPILLARY: Glucose-Capillary: 245 mg/dL — ABNORMAL HIGH (ref 70–99)

## 2019-08-20 MED ORDER — SODIUM CHLORIDE 0.9 % IV BOLUS
1000.0000 mL | Freq: Once | INTRAVENOUS | Status: AC
  Administered 2019-08-20: 1000 mL via INTRAVENOUS

## 2019-08-20 MED ORDER — ENSURE ENLIVE PO LIQD
237.0000 mL | Freq: Two times a day (BID) | ORAL | Status: DC
  Administered 2019-08-21 – 2019-08-28 (×7): 237 mL via ORAL

## 2019-08-20 NOTE — Progress Notes (Signed)
Initial Nutrition Assessment  DOCUMENTATION CODES:   Not applicable  INTERVENTION:   Ensure Enlive po BID, each supplement provides 350 kcal and 20 grams of protein  NUTRITION DIAGNOSIS:   Increased nutrient needs related to post-op healing as evidenced by increased estimated needs.  GOAL:   Patient will meet greater than or equal to 90% of their needs  MONITOR:   PO intake, Supplement acceptance, Labs, Weight trends, Skin, I & O's  REASON FOR ASSESSMENT:   Malnutrition Screening Tool    ASSESSMENT:   73 y.o. 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   RD unable to see patient today. Pt with RUE weakness, slurred speech and overall decline in her mental status today. Per MD note, plan is for STAT CT. Pt documented to be eating fairly well and tolerating diet. RD will add Ensure to support post op healing. Per chart, pt appears fairly weight stable pta. RD will obtain nutrition related history and exam at follow up, once patient is more stable.   Medications reviewed and include: heparin, remeron, protonix, KCl  Labs reviewed: BUN 25(H), creat 1.11(H) Wbc- 17.6(H)  Unable to complete Nutrition-Focused physical exam at this time.   Diet Order:   Diet Order            Diet regular Room service appropriate? Yes; Fluid consistency: Thin  Diet effective now             EDUCATION NEEDS:   No education needs have been identified at this time  Skin:  Skin Assessment: Reviewed RN Assessment(incision chest and abdomen)  Last BM:  11/17- type 7  Height:   Ht Readings from Last 1 Encounters:  08/18/19 5\' 5"  (1.651 m)    Weight:   Wt Readings from Last 1 Encounters:  08/18/19 70.9 kg    Ideal Body Weight:  56.8 kg  BMI:  Body mass index is 26.01 kg/m.  Estimated Nutritional Needs:   Kcal:  1500-1700kcal/day  Protein:  75-85g/day  Fluid:   >1.5L/day  Koleen Distance MS, RD, LDN Pager #- [phone] Office#- [phone] After Hours Pager: [phone]

## 2019-08-20 NOTE — Code Documentation (Signed)
Pt 2 Days Post-Op s/p ileostomy reversal following an LAR for rectosigmoid carcinoma, per bedside RN pt was found to have right arm weakness and hypotension at 1300 today while working with PT, code stroke activated, initial NIHSS 0, pt given fluid bolus, no tPA given due to non focal neuro symptoms,  VAN negative, pt taken to CT for non con CT. After CT pt returned to room on 2C, report off to [place].

## 2019-08-20 NOTE — Progress Notes (Addendum)
[facility] Day(s): 2.   Post op day(s): 2 Days Post-Op.   Interval History:  Patient seen and examined no acute events or new complaints overnight.   Patient was doing well earlier in the day however around 1300 I reassessed the patient and there was concern for orthostatic hypotension and RUE weakness. Her neurologic examination and strength appeared intact and I consulted for PT consult. However, it was felt that she had RUE weakness and slurred speech and overall decline in her mental status compared to prior. These changes were new over the last 90 minutes as of this note.   No abdominal pain aside from incisional soreness, no nausea or emesis.  On regular diet and tolerating well, still with evidence of bowel function.  Was unable to wean to RA throughout the day.    Vital signs in last 24 hours: [min-max] current  Temp:  [97.7 F (36.5 C)-98.9 F (37.2 C)] 97.7 F (36.5 C) (11/19 1348) Pulse Rate:  [67-92] 86 (11/19 1348) Resp:  [16-20] 16 (11/19 1145) BP: (98-154)/(43-56) 104/49 (11/19 1348) SpO2:  [99 %-100 %] 100 % (11/19 1348)     Height: 5\' 5"  (165.1 cm) Weight: 70.9 kg BMI (Calculated): 26.01   Intake/Output last 2 shifts:  11/18 0701 - 11/19 0700 In: 2615.4 [P.O.:2045; I.V.:570.4] Out: -    Physical Exam:  Constitutional: alert, cooperative and no distress  Respiratory: breathing non-labored at rest, on [place]  Cardiovascular: regular rate and sinus rhythm  Gastrointestinal: soft, incisional tenderness, and non-distended. No rebound or guarding Integumentary: Previous ileostomy incision site is CDI with staples, honey comb in place  Neurological: PERRLA, A&Ox3, motor strength and grip strength appear 5/5 in upper and lower extremities, no appreciable speech changes, no gross pronator drift, finger-to-finger movement does appear slower on the right compared to the left, sensation appears intact throughout all  extremities.    Labs:  CBC Latest Ref Rng & Units 08/19/2019 08/18/2019 07/09/2019  WBC 4.0 - 10.5 K/uL 17.6(H) 20.1(H) 5.3  Hemoglobin 12.0 - 15.0 g/dL 11.2(L) 13.7 12.0  Hematocrit 36.0 - 46.0 % 35.0(L) 41.4 37.4  Platelets 150 - 400 K/uL 219 251 218   CMP Latest Ref Rng & Units 08/19/2019 08/18/2019 07/09/2019  Glucose 70 - 99 mg/dL 146(H) - 185(H)  BUN 8 - 23 mg/dL 25(H) - 10  Creatinine 0.44 - 1.00 mg/dL 1.11(H) 0.87 0.64  Sodium 135 - 145 mmol/L 138 - 140  Potassium 3.5 - 5.1 mmol/L 4.8 - 3.7  Chloride 98 - 111 mmol/L 108 - 101  CO2 22 - 32 mmol/L 21(L) - 26  Calcium 8.9 - 10.3 mg/dL 8.8(L) - 9.8  Total Protein 6.5 - 8.1 g/dL - - 7.9  Total Bilirubin 0.3 - 1.2 mg/dL - - 0.3  Alkaline Phos 38 - 126 U/L - - 102  AST 15 - 41 U/L - - 23  ALT 0 - 44 U/L - - 14     Imaging studies: No new pertinent imaging studies   Assessment/Plan:  73 y.o. 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   - Will obtain STAT CT Head given concern over acute changes in neurological status  - [place]us with IVF x1 for hypotension  - Repeat labs in AM  - pain control prn; antiemetics prn             - Monitor abdominal examination; on-going bowel function               -  mobilization encouraged; PT following once medically appropriate             - medical management of comorbid conditions; resume HH at discharge             - DVT prophylaxis    All of the above findings and recommendations were discussed with the patient, and the medical team, and all of patient's questions were answered to her expressed satisfaction.  -- Edison Simon, PA-C [facility] 08/20/2019, 2:18 PM [phone] M-F: 7am - 4pm

## 2019-08-20 NOTE — Consult Note (Signed)
Referring Physician: Pabon    Chief Complaint: Right arm pain and headache  HPI: Gloria Allen is an 73 y.o. female with history of multiple medical problems including colon cancer admitted for ileostomy takedown and removal of portacath which was performed on 11/17.  Patient was scheduled to go home later today.  When about to work with therapy had onset of right sided headache and right arm pain.  Patient was hypotensive.  No weakness noted.  Code stroke called at that time.  Initial NIHSS of 0.  Patient with a history of right shoulder pain s/p surgery at that location.      Date last known well: Date: 08/20/2019 Time last known well: Time: 13:00 tPA Given: No: Resolution of symptoms  Past Medical History:  Diagnosis Date  . Anxiety   . Arthritis   . Asthma   . Cervical central spinal stenosis (C3-C7) (worse at C4-5) 07/30/2017  . Cervical foraminal stenosis (C4-5 and C5-6) (Bilateral) (L>R) 07/30/2017  . Chronic lower extremity pain (Fourth Area of Pain) (Bilateral) (R>L) 06/11/2017  . Chronic neck pain (Primary Area of Pain) (Bilateral) (R>L) 06/11/2017  . Chronic sacroiliac joint pain (Bilateral) (R>L) 06/11/2017  . Chronic shoulder pain [facility] Area of Pain) (Right) 06/11/2017  . Chronic upper extremity pain (Fifth Area of Pain) (Bilateral) (R>L) 06/11/2017  . Colon cancer ([place]) 11/2018   Partial colon resection  . DDD (degenerative disc disease), cervical 07/30/2017  . DDD (degenerative disc disease), lumbar 07/30/2017  . Depression   . DISH (diffuse idiopathic skeletal hyperostosis) 07/30/2017  . Dyspnea    with exertion  . Entrapment syndrome 06/20/2017   2002 on R and 2010 on L  . Full thickness rotator cuff tear 06/12/2017  . GERD (gastroesophageal reflux disease)   . Grade 1 Anterolisthesis of L4 over L5 07/30/2017  . Headache   . Hx: UTI (urinary tract infection)   . Hypertension   . Inflammation of joint of shoulder region 01/15/2017  . Lumbar central spinal stenosis  (L4-5) 07/30/2017  . Lumbar disc protrusion (Left: L5-S1) (Right: L1-2) 07/30/2017   L5-S1 left foraminal protrusion with L5 impingement. L1-2 right paracentral protrusion without impingement.  . Lumbar facet arthropathy (Bilateral) 07/30/2017  . Lumbar facet syndrome (Bilateral) (R>L) 07/30/2017  . Lung cancer ([place]) 11/2018   Chemo  and rad tx's  . Osteoarthritis of shoulder (Right) 01/15/2017    Past Surgical History:  Procedure Laterality Date  . ABDOMINAL HYSTERECTOMY    . APPENDECTOMY    . BREAST REDUCTION SURGERY Bilateral   . COLONOSCOPY WITH PROPOFOL N/A 11/05/2018   Procedure: COLONOSCOPY WITH PROPOFOL;  Surgeon: Virgel Manifold, MD;  Location: [facility] ENDOSCOPY;  Service: Endoscopy;  Laterality: N/A;  . COLONOSCOPY WITH PROPOFOL N/A 11/06/2018   Procedure: COLONOSCOPY WITH PROPOFOL;  Surgeon: Virgel Manifold, MD;  Location: [facility] ENDOSCOPY;  Service: Endoscopy;  Laterality: N/A;  . COLONOSCOPY WITH PROPOFOL N/A 07/15/2019   Procedure: COLONOSCOPY WITH PROPOFOL;  Surgeon: Virgel Manifold, MD;  Location: [facility] ENDOSCOPY;  Service: Endoscopy;  Laterality: N/A;  . COLOSTOMY    . DILATION AND CURETTAGE OF UTERUS    . ESOPHAGOGASTRODUODENOSCOPY (EGD) WITH PROPOFOL N/A 11/05/2018   Procedure: ESOPHAGOGASTRODUODENOSCOPY (EGD) WITH PROPOFOL;  Surgeon: Virgel Manifold, MD;  Location: [facility] ENDOSCOPY;  Service: Endoscopy;  Laterality: N/A;  . ESOPHAGOGASTRODUODENOSCOPY (EGD) WITH PROPOFOL N/A 03/27/2019   Procedure: ESOPHAGOGASTRODUODENOSCOPY (EGD) WITH PROPOFOL;  Surgeon: Lucilla Lame, MD;  Location: [facility] ENDOSCOPY;  Service: Endoscopy;  Laterality: N/A;  . ILEOSTOMY  Right 11/27/2018   Procedure: ILEOSTOMY;  Surgeon: Jules Husbands, MD;  Location: [facility] ORS;  Service: General;  Laterality: Right;  . ILEOSTOMY CLOSURE N/A 08/18/2019   Procedure: ILEOSTOMY TAKEDOWN;  Surgeon: Jules Husbands, MD;  Location: [facility] ORS;  Service: General;  Laterality: N/A;  . LAPAROSCOPIC SIGMOID  COLECTOMY N/A 11/27/2018   Procedure: LAPAROSCOPIC SIGMOID COLECTOMY;  Surgeon: Jules Husbands, MD;  Location: [facility] ORS;  Service: General;  Laterality: N/A;  . ORIF ANKLE FRACTURE Right 11/21/2018   Procedure: OPEN REDUCTION INTERNAL FIXATION (ORIF) ANKLE FRACTURE;  Surgeon: Corky Mull, MD;  Location: [facility] ORS;  Service: Orthopedics;  Laterality: Right;  . PORT-A-CATH REMOVAL Right 08/18/2019   Procedure: REMOVAL PORT-A-CATH;  Surgeon: Jules Husbands, MD;  Location: [facility] ORS;  Service: General;  Laterality: Right;  . PORTACATH PLACEMENT N/A 03/12/2019   Procedure: INSERTION PORT-A-CATH;  Surgeon: Jules Husbands, MD;  Location: [facility] ORS;  Service: General;  Laterality: N/A;  . REDUCTION MAMMAPLASTY    . SHOULDER ARTHROSCOPY WITH OPEN ROTATOR CUFF REPAIR AND DISTAL CLAVICLE ACROMINECTOMY Right 03/26/2017   Procedure: right shoulder arthroscopy, arthroscopic subacromial decompression, distal clavicle excision, mini open rotator cuff repair;  Surgeon: Thornton Park, MD;  Location: [facility] ORS;  Service: Orthopedics;  Laterality: Right;    Family History  Problem Relation Age of Onset  . Hypertension Mother   . Breast cancer Mother 62  . Breast cancer Sister 17   Social History:  reports that she quit smoking about 10 years ago. Her smoking use included cigarettes. She has a 15.00 pack-year smoking history. She has never used smokeless tobacco. She reports current drug use. Drug: Marijuana. She reports that she does not drink alcohol.  Allergies:  Allergies  Allergen Reactions  . Ace Inhibitors Swelling  . Angiotensin Receptor Blockers Swelling  . Sulfa Antibiotics Itching  . Chlorthalidone Rash  . Flexeril [Cyclobenzaprine] Rash    Medications:  I have reviewed the patient's current medications. Prior to Admission:  Medications Prior to Admission  Medication Sig Dispense Refill Last Dose  . albuterol (VENTOLIN HFA) 108 (90 Base) MCG/ACT inhaler Inhale 2 puffs into the lungs every 6  (six) hours as needed for wheezing or shortness of breath. 18 g 0 08/17/2019 at Unknown time  . amLODipine (NORVASC) 5 MG tablet Take 5 mg by mouth daily with lunch.    08/17/2019 at Unknown time  . benzonatate (TESSALON) 200 MG capsule Take 200 mg by mouth 3 (three) times daily as needed for cough.   08/17/2019 at Unknown time  . BREO ELLIPTA 200-25 MCG/INH AEPB Inhale 1 puff into the lungs daily. (Patient taking differently: Inhale 1 puff into the lungs every morning. ) 60 each 0 08/17/2019 at Unknown time  . cloNIDine (CATAPRES) 0.2 MG tablet Take 0.2 mg by mouth 2 (two) times daily.   08/17/2019 at Unknown time  . hydrALAZINE (APRESOLINE) 100 MG tablet Take 1 tablet (100 mg total) by mouth 3 (three) times daily. 90 tablet 0 08/17/2019 at Unknown time  . hydrOXYzine (ATARAX/VISTARIL) 10 MG tablet Take 1 tablet (10 mg total) by mouth 3 (three) times daily as needed for itching. 30 tablet 0 08/17/2019 at Unknown time  . loratadine (CLARITIN) 10 MG tablet Take 1 tablet (10 mg total) by mouth daily. (Patient taking differently: Take 10 mg by mouth every morning. ) 30 tablet 0 08/17/2019 at Unknown time  . lovastatin (MEVACOR) 40 MG tablet Take 1 tablet (40 mg total) by mouth at bedtime. [place]  tablet 0 08/17/2019 at Unknown time  . meclizine (ANTIVERT) 12.5 MG tablet Take 1 tablet (12.5 mg total) by mouth 3 (three) times daily as needed for dizziness. 30 tablet 0 08/17/2019 at Unknown time  . metoprolol succinate (TOPROL-XL) 50 MG 24 hr tablet Take 50 mg by mouth daily with lunch. Take with or immediately following a meal.    08/17/2019 at Unknown time  . mirtazapine (REMERON) 15 MG tablet Take 15 mg by mouth at bedtime.   08/17/2019 at Unknown time  . montelukast (SINGULAIR) 10 MG tablet Take 1 tablet (10 mg total) by mouth at bedtime as needed. (Patient taking differently: Take 10 mg by mouth at bedtime. ) 30 tablet 0 08/17/2019 at Unknown time  . omeprazole (PRILOSEC) 40 MG capsule Take 1 capsule (40 mg  total) by mouth 2 (two) times daily. (Patient taking differently: Take 40 mg by mouth 2 (two) times daily as needed (acid reflux/indigestion.). ) 60 capsule 0 08/17/2019 at Unknown time  . potassium chloride SA (K-DUR) 20 MEQ tablet Take 1 tablet (20 mEq total) by mouth 2 (two) times daily. (Patient taking differently: Take 20 mEq by mouth daily. ) 28 tablet 0 08/17/2019 at Unknown time  . pregabalin (LYRICA) 50 MG capsule Take 1 capsule (50 mg total) by mouth at bedtime for 30 days, THEN 1 capsule (50 mg total) 2 (two) times daily. 150 capsule 0 08/17/2019 at Unknown time  . sertraline (ZOLOFT) 100 MG tablet Take 2 tablets (200 mg total) by mouth at bedtime. 60 tablet 0 08/17/2019 at Unknown time  . terazosin (HYTRIN) 1 MG capsule Take 1 capsule (1 mg total) by mouth at bedtime. 30 capsule 0 08/17/2019 at Unknown time  . tiZANidine (ZANAFLEX) 4 MG tablet Take 1 tablet (4 mg total) by mouth 2 (two) times daily as needed for muscle spasms. (Patient taking differently: Take 4 mg by mouth 2 (two) times daily. ) 60 tablet 0 08/17/2019 at Unknown time  . amLODipine (NORVASC) 10 MG tablet Take 1 tablet (10 mg total) by mouth daily. (Patient not taking: Reported on 08/05/2019) 30 tablet 0 Not Taking at Unknown time  . lidocaine-prilocaine (EMLA) cream Apply 1 application topically as directed. Place small amt over port site 1 hour before coming in cancer center for port flush (Patient not taking: Reported on 08/05/2019) 30 g 1 Not Taking at Unknown time  . loperamide (IMODIUM) 2 MG capsule Take 2 capsules (4 mg total) by mouth 3 (three) times daily. (Patient not taking: Reported on 08/05/2019) 180 capsule 3 Not Taking at Unknown time  . metoprolol succinate (TOPROL-XL) 100 MG 24 hr tablet Take 1 tablet (100 mg total) by mouth daily. Take with or immediately following a meal. (Patient not taking: Reported on 08/05/2019) 30 tablet 0 Not Taking at Unknown time  . OLANZapine (ZYPREXA) 10 MG tablet Take 1 tablet (10 mg  total) by mouth at bedtime. (Patient not taking: Reported on 07/29/2019) 30 tablet 0   . ondansetron (ZOFRAN) 8 MG tablet Take 1 tablet (8 mg total) by mouth every 8 (eight) hours as needed for nausea. Start on day 3 after carboplatin chemo. (Patient not taking: Reported on 08/05/2019) 60 tablet 0 Not Taking at Unknown time   Scheduled: . acetaminophen  1,000 mg Oral Q6H  . amLODipine  5 mg Oral Q lunch  . cloNIDine  0.2 mg Oral BID  . feeding supplement (ENSURE ENLIVE)  237 mL Oral BID BM  . fluticasone furoate-vilanterol  1 puff Inhalation BH-q7a  .  heparin  5,000 Units Subcutaneous Q8H  . hydrALAZINE  100 mg Oral TID  . ketorolac  15 mg Intravenous Q6H  . loratadine  10 mg Oral BH-q7a  . metoprolol succinate  50 mg Oral Q lunch  . mirtazapine  15 mg Oral QHS  . montelukast  10 mg Oral QHS  . pantoprazole  40 mg Oral QHS  . potassium chloride SA  20 mEq Oral Daily  . pravastatin  10 mg Oral q1800  . pregabalin  75 mg Oral BID  . sertraline  200 mg Oral QHS  . terazosin  1 mg Oral QHS  . tiZANidine  4 mg Oral BID    ROS: History obtained from the patient  General ROS: negative for - chills, fatigue, fever, night sweats, weight gain or weight loss Psychological ROS: negative for - behavioral disorder, hallucinations, memory difficulties, mood swings or suicidal ideation Ophthalmic ROS: negative for - blurry vision, double vision, eye pain or loss of vision ENT ROS: dizziness Allergy and Immunology ROS: negative for - hives or itchy/watery eyes Hematological and Lymphatic ROS: negative for - bleeding problems, bruising or swollen lymph nodes Endocrine ROS: negative for - galactorrhea, hair pattern changes, polydipsia/polyuria or temperature intolerance Respiratory ROS: negative for - cough, hemoptysis, shortness of breath or wheezing Cardiovascular ROS: negative for - chest pain, dyspnea on exertion, edema or irregular heartbeat Gastrointestinal ROS: negative for - abdominal pain,  diarrhea, hematemesis, nausea/vomiting or stool incontinence Genito-Urinary ROS: negative for - dysuria, hematuria, incontinence or urinary frequency/urgency Musculoskeletal ROS: right arm pain Neurological ROS: as noted in HPI Dermatological ROS: negative for rash and skin lesion changes  Physical Examination: Blood pressure (!) 104/49, pulse 86, temperature 97.7 F (36.5 C), temperature source Oral, resp. rate 16, height 5\' 5"  (1.651 m), weight 70.9 kg, SpO2 100 %.  HEENT-  Normocephalic, no lesions, without obvious abnormality.  Normal external eye and conjunctiva.  Normal TM's bilaterally.  Normal auditory canals and external ears. Normal external nose, mucus membranes and septum.  Normal pharynx. Cardiovascular- S1, S2 normal, pulses palpable throughout   Lungs- chest clear, no wheezing, rales, normal symmetric air entry Abdomen- soft, non-tender; bowel sounds normal; no masses,  no organomegaly Extremities- no edema Lymph-no adenopathy palpable Musculoskeletal-no joint tenderness, deformity or swelling Skin-stitches at right shoulder  Neurological Examination   Mental Status: Alert, oriented, thought content appropriate.  Speech fluent without evidence of aphasia.  Able to follow 3 step commands without difficulty. Cranial Nerves: II: Visual fields grossly normal, pupils equal, round, reactive to light and accommodation III,IV, VI: ptosis not present, extra-ocular motions intact bilaterally V,VII: smile symmetric, facial light touch sensation normal bilaterally VIII: hearing normal bilaterally IX,X: gag reflex present XI: bilateral shoulder shrug XII: midline tongue extension Motor: Right : Upper extremity   5/5    Left:     Upper extremity   5/5  Lower extremity   5/5     Lower extremity   5/5 Tone and bulk:normal tone throughout; no atrophy noted Sensory: Pinprick and light touch intact throughout, bilaterally Deep Tendon Reflexes: Symmetric throughout Plantars: Right:  mute   Left: mute Cerebellar: Normal finger-to-nose and normal heel-to-shin testing bilaterally Gait: not tested due to safety concerns    Laboratory Studies:  Basic Metabolic Panel: Recent Labs  Lab 08/18/19 1538 08/19/19 0453  NA  --  138  K  --  4.8  CL  --  108  CO2  --  21*  GLUCOSE  --  146*  BUN  --  25*  CREATININE 0.87 1.11*  CALCIUM  --  8.8*    Liver Function Tests: No results for input(s): AST, ALT, ALKPHOS, BILITOT, PROT, ALBUMIN in the last 168 hours. No results for input(s): LIPASE, AMYLASE in the last 168 hours. No results for input(s): AMMONIA in the last 168 hours.  CBC: Recent Labs  Lab 08/18/19 1538 08/19/19 0453  WBC 20.1* 17.6*  HGB 13.7 11.2*  HCT 41.4 35.0*  MCV 86.8 89.5  PLT 251 219    Cardiac Enzymes: No results for input(s): CKTOTAL, CKMB, CKMBINDEX, TROPONINI in the last 168 hours.  BNP: Invalid input(s): POCBNP  CBG: Recent Labs  Lab 08/20/19 1517  GLUCAP 245*    Microbiology: Results for orders placed or performed during the hospital encounter of 08/14/19  SARS CORONAVIRUS 2 (TAT 6-24 HRS) Nasopharyngeal Nasopharyngeal Swab     Status: None   Collection Time: 08/14/19 10:45 AM   Specimen: Nasopharyngeal Swab  Result Value Ref Range Status   SARS Coronavirus 2 NEGATIVE NEGATIVE Final    Comment: (NOTE) SARS-CoV-2 target nucleic acids are NOT DETECTED. The SARS-CoV-2 RNA is generally detectable in upper and lower respiratory specimens during the acute phase of infection. Negative results do not preclude SARS-CoV-2 infection, do not rule out co-infections with other pathogens, and should not be used as the sole basis for treatment or other patient management decisions. Negative results must be combined with clinical observations, patient history, and epidemiological information. The expected result is Negative. Fact Sheet for Patients: SugarRoll.be Fact Sheet for Healthcare  Providers: https://www.woods-mathews.com/ This test is not yet approved or cleared by the [place] FDA and  has been authorized for detection and/or diagnosis of SARS-CoV-2 by FDA under an Emergency Use Authorization (EUA). This EUA will remain  in effect (meaning this test can be used) for the duration of the COVID-19 declaration under Section 56 4(b)(1) of the Act, 21 U.S.C. section 360bbb-3(b)(1), unless the authorization is terminated or revoked sooner. Performed at [facility], [place] [address] [place], [place] [address]     Coagulation Studies: No results for input(s): LABPROT, INR in the last 72 hours.  Urinalysis: No results for input(s): COLORURINE, LABSPEC, PHURINE, GLUCOSEU, HGBUR, BILIRUBINUR, KETONESUR, PROTEINUR, UROBILINOGEN, NITRITE, LEUKOCYTESUR in the last 168 hours.  Invalid input(s): APPERANCEUR  Lipid Panel:    Component Value Date/Time   CHOL 159 04/22/2018 1559   TRIG 277 (H) 04/22/2018 1559   HDL 58 04/22/2018 1559   LDLCALC 46 04/22/2018 1559    HgbA1C:  Lab Results  Component Value Date   HGBA1C 6.4 04/22/2018    Urine Drug Screen:      Component Value Date/Time   LABOPIA NONE DETECTED 08/18/2019 0905   COCAINSCRNUR NONE DETECTED 08/18/2019 0905   LABBENZ NONE DETECTED 08/18/2019 0905   AMPHETMU NONE DETECTED 08/18/2019 0905   THCU NONE DETECTED 08/18/2019 0905   LABBARB NONE DETECTED 08/18/2019 0905    Alcohol Level: No results for input(s): ETH in the last 168 hours.   Imaging: No results found.  Assessment: 73 y.o. female with history of multiple medical problems including colon cancer admitted for ileostomy takedown and removal of portacath which was performed on 11/17.  Today developed right sided headache and right arm pain.  Patient was hypotensive.  No weakness noted.  Initial NIHSS of 0.  Head CT reviewed and shows no acute changes.  With resolution of symptoms patient not a tPA candidate.  Doubt ischemic  event.     Stroke Risk  Factors - hypertension  Plan: 1. Continue to follow BP's.  May need liberalization of BP medications 2. PT consult, OT consult, Speech consult 3. Prophylactic therapy-ASA 81mg  once cleared medically  4. Telemetry monitoring 5. Frequent neuro checks   Alexis Goodell, MD Neurology [phone] 08/20/2019, 3:52 PM

## 2019-08-20 NOTE — Progress Notes (Signed)
Alan from PT was unable to evaluate patient. Patient complained of RUE pain that radiated to her shoulder. Patient also stated she was feeling woozy. Assessed patient; no symptoms were noted during neuro assessment. Alerted MD for further evaluation.   Fuller Mandril, RN

## 2019-08-20 NOTE — Progress Notes (Signed)
   08/20/19 1600  Clinical Encounter Type  Visited With Patient not available;Health care provider  Visit Type Code  Referral From Nurse   This chaplain received a code stroke page for the patient. Upon arrival, the medical team was assessing the patient's status and administering care. Chaplain maintained pastoral presence outside of the patient's room, offering silent prayer. Patient taken to CT. No additional needs at this time. Please call or page if necessary.

## 2019-08-20 NOTE — Progress Notes (Signed)
PT Cancellation Note  Patient Details Name: Gloria Allen MRN: [id number] DOB: [birth year]   Cancelled Treatment:    Reason Eval/Treat Not Completed: Patient not medically ready;Medical issues which prohibited therapy. Chart reviewed, RN consulted. RN reports pt c BP drop about an hour ago. Author entered room to find pt finishing lunch, pt reports continued dizziness and weakness. Pt reports difficulty with bimanual manipulation during meal, difficulty capping/uncapping Coke bottle. BP taken 100s/43mmHg, previously 98/34mmHg. Pt reports weakness since then. Pt also has slurred speech, which she confirms is not baseline. Grips bilat strong, Rt 10-20% impaired. Hand to head to slow and labored on right with mild ataxia. Digital opposition is heavily impaired on Right hand, pt struggling to bring thumb to each fingertip. Pt reports HA since yesterday. RN made aware of pt's current state, will hold PT evaluation at this time and until pt can be medically assessed.   1:49 PM, 08/20/19 Etta Grandchild, PT, DPT Physical Therapist - [facility]  [phone] ([place])     Kellsie Grindle C 08/20/2019, 1:45 PM

## 2019-08-20 NOTE — Significant Event (Signed)
Rapid Response Event Note  Overview: Time Called: 1507 Arrival Time: 1508 Event Type: Neurologic  Initial Focused Assessment: Responded to Code Stroke page. Arrived in patient's room with patient alert and lying in bed with her nurse at bedside. Per patient and nurse patient had been working with PT and had a sudden onset of pain in her right arm and generalized weakness. Patient also had drop in BP during this episode.Patient's last known well time 13:56.  Upon this RN's arrival patient was alert and oriented, BEFAST and VAN negative. Patient did report that she had shoulder surgery before in the arm that was hurting and that she often has difficulty with her blood pressure. Vital signs: HR 88 NSR, BP 126/58, MAP 79, RR 16 unlabored, O2 sats 100% on 1 L.  Interventions: Patient's RN started a IV bolus of NS per previous MD order. CBG 245. Dr. Doy Mince at bedside and assessed patient, then this RN took patient to CT with Jana Half RN. Upon arrival back from CT patient was still alert and oriented and vital signs were HR 89 NSR, RR 16 unlabored, oxygen saturation 98% on room air, and BP 129/71, MAP 87  Plan of Care (if not transferred):  Plan to remain on 2C for now. Patient and RN to recall rapid response if neurological condition worsens or further assistance needed.  Event Summary: Name of Physician Notified: Dr. Doy Mince at 1507    at    Outcome: Stayed in room and stabalized  Event End Time: [place], [place]

## 2019-08-20 NOTE — Progress Notes (Signed)
MD ordered STAT head CT. Code stroke initiated.   Fuller Mandril, RN

## 2019-08-21 ENCOUNTER — Inpatient Hospital Stay: Payer: Medicare Other

## 2019-08-21 LAB — CBC
HCT: 29.3 % — ABNORMAL LOW (ref 36.0–46.0)
Hemoglobin: 9.3 g/dL — ABNORMAL LOW (ref 12.0–15.0)
MCH: 28.6 pg (ref 26.0–34.0)
MCHC: 31.7 g/dL (ref 30.0–36.0)
MCV: 90.2 fL (ref 80.0–100.0)
Platelets: 196 10*3/uL (ref 150–400)
RBC: 3.25 MIL/uL — ABNORMAL LOW (ref 3.87–5.11)
RDW: 13.5 % (ref 11.5–15.5)
WBC: 12.1 10*3/uL — ABNORMAL HIGH (ref 4.0–10.5)
nRBC: 0 % (ref 0.0–0.2)

## 2019-08-21 LAB — COMPREHENSIVE METABOLIC PANEL
ALT: 16 U/L (ref 0–44)
AST: 24 U/L (ref 15–41)
Albumin: 2.9 g/dL — ABNORMAL LOW (ref 3.5–5.0)
Alkaline Phosphatase: 85 U/L (ref 38–126)
Anion gap: 10 (ref 5–15)
BUN: 10 mg/dL (ref 8–23)
CO2: 20 mmol/L — ABNORMAL LOW (ref 22–32)
Calcium: 8.5 mg/dL — ABNORMAL LOW (ref 8.9–10.3)
Chloride: 110 mmol/L (ref 98–111)
Creatinine, Ser: 0.66 mg/dL (ref 0.44–1.00)
GFR calc Af Amer: >60 mL/min (ref >60)
GFR calc non Af Amer: >60 mL/min (ref >60)
Glucose, Bld: 148 mg/dL — ABNORMAL HIGH (ref 70–99)
Potassium: 3.7 mmol/L (ref 3.5–5.1)
Sodium: 140 mmol/L (ref 135–145)
Total Bilirubin: 0.5 mg/dL (ref 0.3–1.2)
Total Protein: 6.4 g/dL — ABNORMAL LOW (ref 6.5–8.1)

## 2019-08-21 LAB — MAGNESIUM: Magnesium: 1.7 mg/dL (ref 1.7–2.4)

## 2019-08-21 LAB — PHOSPHORUS: Phosphorus: 2.2 mg/dL — ABNORMAL LOW (ref 2.5–4.6)

## 2019-08-21 MED ORDER — SODIUM CHLORIDE 0.9 % IV BOLUS
1000.0000 mL | Freq: Once | INTRAVENOUS | Status: AC
  Administered 2019-08-21: 1000 mL via INTRAVENOUS

## 2019-08-21 MED ORDER — CLONIDINE HCL 0.1 MG PO TABS
0.2000 mg | ORAL_TABLET | Freq: Two times a day (BID) | ORAL | Status: DC
  Administered 2019-08-22 – 2019-08-24 (×5): 0.2 mg via ORAL
  Filled 2019-08-21 (×5): qty 2

## 2019-08-21 MED ORDER — PIPERACILLIN-TAZOBACTAM 3.375 G IVPB
3.3750 g | Freq: Three times a day (TID) | INTRAVENOUS | Status: DC
  Administered 2019-08-21 – 2019-08-26 (×15): 3.375 g via INTRAVENOUS
  Filled 2019-08-21 (×15): qty 50

## 2019-08-21 NOTE — Progress Notes (Signed)
[facility] Day(s): 3.   Post op day(s): 3 Days Post-Op.   Interval History:  Patient seen and examined Neurologic symptoms yesterday but negative work up She still reports feeling generalized weakness and right sided headache, somewhat improved compared to yesterday afternoon.  She did have a low grade fever to 100.9 No nausea or emesis, flatus and BM yesterday.  Tolerating soft diet.  Working with PT.   Vital signs in last 24 hours: [min-max] current  Temp:  [97.7 F (36.5 C)-100.9 F (38.3 C)] 98.5 F (36.9 C) (11/20 0829) Pulse Rate:  [77-86] 77 (11/20 0937) Resp:  [16-18] 18 (11/20 0500) BP: (104-157)/(49-64) 142/64 (11/20 0938) SpO2:  [94 %-100 %] 94 % (11/20 0939)     Height: 5\' 5"  (165.1 cm) Weight: 70.9 kg BMI (Calculated): 26.01   Intake/Output last 2 shifts:  11/19 0701 - 11/20 0700 In: 1669.6 [I.V.:663.3; IV Piggyback:1006.4] Out: -    Physical Exam:  Constitutional: alert, cooperative and no distress  Respiratory: breathing non-labored at rest, on [place]  Cardiovascular: regular rate and sinus rhythm  Gastrointestinal: soft, incisional tenderness, and non-distended. No rebound or guarding Integumentary:Previous ileostomy incision site is CDI with staples, honey comb in place   Labs:  CBC Latest Ref Rng & Units 08/21/2019 08/19/2019 08/18/2019  WBC 4.0 - 10.5 K/uL 12.1(H) 17.6(H) 20.1(H)  Hemoglobin 12.0 - 15.0 g/dL 9.3(L) 11.2(L) 13.7  Hematocrit 36.0 - 46.0 % 29.3(L) 35.0(L) 41.4  Platelets 150 - 400 K/uL 196 219 251   CMP Latest Ref Rng & Units 08/21/2019 08/19/2019 08/18/2019  Glucose 70 - 99 mg/dL 148(H) 146(H) -  BUN 8 - 23 mg/dL 10 25(H) -  Creatinine 0.44 - 1.00 mg/dL 0.66 1.11(H) 0.87  Sodium 135 - 145 mmol/L 140 138 -  Potassium 3.5 - 5.1 mmol/L 3.7 4.8 -  Chloride 98 - 111 mmol/L 110 108 -  CO2 22 - 32 mmol/L 20(L) 21(L) -  Calcium 8.9 - 10.3 mg/dL 8.5(L) 8.8(L) -  Total Protein 6.5 - 8.1 g/dL  6.4(L) - -  Total Bilirubin 0.3 - 1.2 mg/dL 0.5 - -  Alkaline Phos 38 - 126 U/L 85 - -  AST 15 - 41 U/L 24 - -  ALT 0 - 44 U/L 16 - -     Imaging studies: No new pertinent imaging studies   Assessment/Plan: 73 y.o. 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  -- Edison Simon, PA-C [facility] 08/21/2019, 12:14 PM [phone] M-F: 7am - 4pm

## 2019-08-21 NOTE — Consult Note (Signed)
Pharmacy Antibiotic Note  Gloria Allen is a 73 y.o. female admitted on 08/18/2019 with fever.  Pharmacy has been consulted for Zosyn dosing.  Cefotetan 2g surgical prophylaxis   Plan: Zosyn 3.375 g Q8H (extended infusion) - will start now.   Height: 5\' 5"  (165.1 cm) Weight: 156 lb 4.9 oz (70.9 kg) IBW/kg (Calculated) : 57  Temp (24hrs), Avg:100.2 F (37.9 C), Min:98.5 F (36.9 C), Max:103.2 F (39.6 C)  Recent Labs  Lab 08/18/19 1538 08/19/19 0453 08/21/19 0635  WBC 20.1* 17.6* 12.1*  CREATININE 0.87 1.11* 0.66    Estimated Creatinine Clearance: 63.7 mL/min (by C-G formula based on SCr of 0.66 mg/dL).    Allergies  Allergen Reactions  . Ace Inhibitors Swelling  . Angiotensin Receptor Blockers Swelling  . Sulfa Antibiotics Itching  . Chlorthalidone Rash  . Flexeril [Cyclobenzaprine] Rash    Antimicrobials this admission: 11/20  Zosyn >>   Microbiology results: 11/20 BCx: pending    Thank you for allowing pharmacy to be a part of this patient's care.  Rowland Lathe 08/21/2019 5:46 PM

## 2019-08-21 NOTE — Evaluation (Signed)
Physical Therapy Evaluation Patient Details Name: Gloria Allen MRN: [id number] DOB: Jul 18, 1946 Today's Date: 08/21/2019   History of Present Illness  Amilyah Nack is a 27yoF who comes [facility] on 11/17 for removal of Right IJ portacath, ileostomy takedown, and repair of parastomal hernia. Code stroke called on 11/19 after acute onset slurring of speech, changes to fine motor coordination, hypotension, and HA. Stat head CT negative, neurology not suspecting any ischaemic process.  Clinical Impression  Pt admitted with above diagnosis. Pt currently with functional limitations due to the deficits listed below (see "PT Problem List"). Upon entry, pt in bed, awake and agreeable to participate. The pt is alert and oriented x4, pleasant, conversational, and generally a good historian. Regarding code stroke prior day: slurring of speech is resolved, HA largely resolves (returns after session), fine motor deficits in Right hand resolved by 75%, pt denies dizziness/sleepiness. This date, ABD pain is somewhat limiting, but pt performs all mobility without physical assistance. Pt reports to have more acute weakness issues than anything else, minimally worse balance impairment. Functional mobility assessment demonstrates increased effort/time requirements, fair tolerance, and need for physical assistance, whereas the patient performed these at a higher level of independence PTA. Pt reports weakness in the legs when AMB which limits distance. In current state, pt could safely access home, but would not tolerate distances required for accessing the community for IADL/groceries. Pt will benefit from skilled PT intervention to increase independence and safety with basic mobility in preparation for discharge to the venue listed below.       Follow Up Recommendations Home health PT    Equipment Recommendations  None recommended by PT    Recommendations for Other Services OT consult(still has changes to fine motor  coordination)     Precautions / Restrictions Precautions Precautions: Fall      Mobility  Bed Mobility Overal bed mobility: Modified Independent;Needs Assistance Bed Mobility: Supine to Sit     Supine to sit: Supervision     General bed mobility comments: pain is inhibitory, no physical assis needed, HOB elevated. bed saturated with urine.  Transfers Overall transfer level: Modified independent Equipment used: None                Ambulation/Gait Ambulation/Gait assistance: Supervision Gait Distance (Feet): 85 Feet Assistive device: Rolling walker (2 wheeled) Gait Pattern/deviations: Step-to pattern Gait velocity: 0.95m/s   General Gait Details: pt reports to feel very weak compared to baseline  Stairs            Wheelchair Mobility    Modified Rankin (Stroke Patients Only)       Balance Overall balance assessment: Modified Independent;Mild deficits observed, not formally tested                                           Pertinent Vitals/Pain Pain Assessment: 0-10 Pain Score: 7  Pain Location: stomach pain Pain Descriptors / Indicators: Operative site guarding Pain Intervention(s): Limited activity within patient's tolerance;Monitored during session    Home Living Family/patient expects to be discharged to:: Private residence Living Arrangements: Alone Available Help at Discharge: Neighbor;Available PRN/intermittently Type of Home: House Home Access: Stairs to enter Entrance Stairs-Rails: Can reach both Entrance Stairs-Number of Steps: 3 Home Layout: One level Home Equipment: Cane - single point;Walker - 2 wheels;Bedside commode;Shower seat      Prior Function Level of Independence: Independent with  assistive device(s)               Hand Dominance   Dominant Hand: Right    Extremity/Trunk Assessment   Upper Extremity Assessment Upper Extremity Assessment: Generalized weakness;Overall WFL for tasks  assessed;RUE deficits/detail RUE Deficits / Details: able to oppose pollux to all digits this date, unlike yesterday, but remains slightly impaired.    Lower Extremity Assessment Lower Extremity Assessment: Generalized weakness;Overall [facility] for tasks assessed    Cervical / Trunk Assessment Cervical / Trunk Assessment: Normal  Communication      Cognition Arousal/Alertness: Awake/alert Behavior During Therapy: WFL for tasks assessed/performed Overall Cognitive Status: Within Functional Limits for tasks assessed                                        General Comments      Exercises     Assessment/Plan    PT Assessment Patient needs continued PT services  PT Problem List Decreased mobility;Decreased balance;Decreased activity tolerance;Decreased range of motion;Decreased strength       PT Treatment Interventions Gait training;DME instruction;Balance training;Functional mobility training;Therapeutic activities;Therapeutic exercise;Patient/family education    PT Goals (Current goals can be found in the Care Plan section)  Acute Rehab PT Goals Patient Stated Goal: regain strength per baseline PT Goal Formulation: With patient Time For Goal Achievement: 08/21/19 Potential to Achieve Goals: Good    Frequency Min 2X/week   Barriers to discharge        Co-evaluation               AM-PAC PT "6 Clicks" Mobility  Outcome Measure Help needed turning from your back to your side while in a flat bed without using bedrails?: A Little Help needed moving from lying on your back to sitting on the side of a flat bed without using bedrails?: A Little Help needed moving to and from a bed to a chair (including a wheelchair)?: A Little Help needed standing up from a chair using your arms (e.g., wheelchair or bedside chair)?: A Little Help needed to walk in hospital room?: A Little Help needed climbing 3-5 steps with a railing? : A Little 6 Click Score: 18    End  of Session Equipment Utilized During Treatment: Gait belt Activity Tolerance: Patient tolerated treatment well;Patient limited by fatigue Patient left: in chair;with nursing/sitter in room;with SCD's reapplied Nurse Communication: Mobility status PT Visit Diagnosis: Unsteadiness on feet (R26.81);Other abnormalities of gait and mobility (R26.89);Muscle weakness (generalized) (M62.81)    Time: 3734-2876 PT Time Calculation (min) (ACUTE ONLY): 19 min   Charges:   PT Evaluation $PT Eval Moderate Complexity: 1 Mod         ,9:54 AM, 08/21/19 Etta Grandchild, PT, DPT Physical Therapist - [facility]  [phone] ([place])   Buccola,Allan C 08/21/2019, 9:51 AM

## 2019-08-21 NOTE — Care Management Important Message (Signed)
Important Message  Patient Details  Name: Gloria Allen MRN: [id number] Date of Birth: Aug 27, 1946   Medicare Important Message Given:  Yes     Dannette Barbara 08/21/2019, 12:22 PM

## 2019-08-21 NOTE — Progress Notes (Signed)
Primary nurse called and spoke to Dr. Dahlia Byes and results reviewed of Chest/ Abd xray. Dr. Dahlia Byes to place orders for consult for hospitalist to consult. Primary nurse to continue to monitor.

## 2019-08-22 ENCOUNTER — Inpatient Hospital Stay: Payer: Medicare Other

## 2019-08-22 DIAGNOSIS — J690 Pneumonitis due to inhalation of food and vomit: Secondary | ICD-10-CM | POA: Diagnosis present

## 2019-08-22 LAB — CBC
HCT: 29.9 % — ABNORMAL LOW (ref 36.0–46.0)
Hemoglobin: 9.8 g/dL — ABNORMAL LOW (ref 12.0–15.0)
MCH: 28.5 pg (ref 26.0–34.0)
MCHC: 32.8 g/dL (ref 30.0–36.0)
MCV: 86.9 fL (ref 80.0–100.0)
Platelets: 218 10*3/uL (ref 150–400)
RBC: 3.44 MIL/uL — ABNORMAL LOW (ref 3.87–5.11)
RDW: 13.7 % (ref 11.5–15.5)
WBC: 13.7 10*3/uL — ABNORMAL HIGH (ref 4.0–10.5)
nRBC: 0 % (ref 0.0–0.2)

## 2019-08-22 LAB — HEMOGLOBIN A1C
Hgb A1c MFr Bld: 6.5 % — ABNORMAL HIGH (ref 4.8–5.6)
Mean Plasma Glucose: 139.85 mg/dL

## 2019-08-22 LAB — BRAIN NATRIURETIC PEPTIDE: B Natriuretic Peptide: 372 pg/mL — ABNORMAL HIGH (ref 0.0–100.0)

## 2019-08-22 LAB — COMPREHENSIVE METABOLIC PANEL
ALT: 19 U/L (ref 0–44)
AST: 23 U/L (ref 15–41)
Albumin: 2.8 g/dL — ABNORMAL LOW (ref 3.5–5.0)
Alkaline Phosphatase: 107 U/L (ref 38–126)
Anion gap: 10 (ref 5–15)
BUN: 8 mg/dL (ref 8–23)
CO2: 21 mmol/L — ABNORMAL LOW (ref 22–32)
Calcium: 8.4 mg/dL — ABNORMAL LOW (ref 8.9–10.3)
Chloride: 109 mmol/L (ref 98–111)
Creatinine, Ser: 0.64 mg/dL (ref 0.44–1.00)
GFR calc Af Amer: >60 mL/min (ref >60)
GFR calc non Af Amer: >60 mL/min (ref >60)
Glucose, Bld: 143 mg/dL — ABNORMAL HIGH (ref 70–99)
Potassium: 3.4 mmol/L — ABNORMAL LOW (ref 3.5–5.1)
Sodium: 140 mmol/L (ref 135–145)
Total Bilirubin: 0.6 mg/dL (ref 0.3–1.2)
Total Protein: 6.3 g/dL — ABNORMAL LOW (ref 6.5–8.1)

## 2019-08-22 LAB — SARS CORONAVIRUS 2 (TAT 6-24 HRS): SARS Coronavirus 2: NEGATIVE

## 2019-08-22 MED ORDER — IOHEXOL 300 MG/ML  SOLN
100.0000 mL | Freq: Once | INTRAMUSCULAR | Status: AC | PRN
  Administered 2019-08-22: 100 mL via INTRAVENOUS

## 2019-08-22 MED ORDER — LACTATED RINGERS IV SOLN
INTRAVENOUS | Status: AC
  Administered 2019-08-22 – 2019-08-23 (×3): via INTRAVENOUS

## 2019-08-22 MED ORDER — IOHEXOL 9 MG/ML PO SOLN
500.0000 mL | ORAL | Status: AC
  Administered 2019-08-22 (×2): 500 mL via ORAL

## 2019-08-22 MED ORDER — HYDRALAZINE HCL 50 MG PO TABS
50.0000 mg | ORAL_TABLET | Freq: Three times a day (TID) | ORAL | Status: DC
  Administered 2019-08-22 – 2019-08-23 (×2): 50 mg via ORAL
  Filled 2019-08-22 (×2): qty 1

## 2019-08-22 MED ORDER — ACETAMINOPHEN 325 MG PO TABS
650.0000 mg | ORAL_TABLET | Freq: Four times a day (QID) | ORAL | Status: DC | PRN
  Administered 2019-08-22 – 2019-08-26 (×11): 650 mg via ORAL
  Filled 2019-08-22 (×11): qty 2

## 2019-08-22 NOTE — Consult Note (Signed)
[place] Consultation  VERTIS BAUDER TOI:[id number] DOB: [birth year] DOA: 08/18/2019 PCP: [facility] [employer]   Requesting physician: Dr.Pabon. Date of consultation: 08/22/2019 Reason for consultation: Fever/ abnormal chest xray post op/ sob.  Impression/Recommendations Principal Problem:   Aspiration pneumonia of right lung (HCC) Active Problems:   Small cell lung cancer (HCC)   SOB (shortness of breath)   S/P closure of ileostomy   GERD (gastroesophageal reflux disease)   Essential hypertension   Malignant neoplasm of colon (HCC)   Aspiration PNA/ SOB Pt is post op and suspect she has developed Aspiration pneumonia , she has responded well to zosyn and we will continue. However with her h/o lung cancer would obtain CTA chest with contrast to identify any VTE as pt is at high risk for PE/ DVT Oxygen if o2 sats go below 95 as her current o2 sats on RA is above 95%.as she does reports sob we will go ahead and obtain LE venous doppler and CTA chest PE protocol.I Vppi therapy.    Small cell ung cancer/ colon cancer: Pt is followed closely by her oncologist .  GERD:  PPI therapy continued.  HTN; Meds reveiwed and tailored pt continued on her  Hydralazine changed to 50 mg tid and metoprolol and clonidine  continued.  I will followup again tomorrow. Please contact me if I can be of assistance in the meanwhile. Thank you for this consultation.  Chief Complaint: SOB  HPI:  Pt is a 73 y/o female admitted on the 17th of November for Ileostomy takedown and removal of port a cath. Pt has a h/o colon cancer for which she has completed radiation and chemotherapy.Pt was doing well since her sx up until the 20th when she started to have fever and was found to have infiltrate on xray done 11/19/202 which shows ground glass opacity  And consolidation on right perihilar lung concerning for pneumonia. Pt is otherwise doing well.    Review of Systems:  Review of  Systems  Constitutional: Negative.   HENT: Negative.   Respiratory: Positive for shortness of breath.   Cardiovascular: Negative.   Gastrointestinal: Negative for abdominal pain.  Musculoskeletal: Negative.   Neurological: Negative.      Past Medical History:  Diagnosis Date  . Anxiety   . Arthritis   . Asthma   . Cervical central spinal stenosis (C3-C7) (worse at C4-5) 07/30/2017  . Cervical foraminal stenosis (C4-5 and C5-6) (Bilateral) (L>R) 07/30/2017  . Chronic lower extremity pain (Fourth Area of Pain) (Bilateral) (R>L) 06/11/2017  . Chronic neck pain (Primary Area of Pain) (Bilateral) (R>L) 06/11/2017  . Chronic sacroiliac joint pain (Bilateral) (R>L) 06/11/2017  . Chronic shoulder pain [facility] Area of Pain) (Right) 06/11/2017  . Chronic upper extremity pain (Fifth Area of Pain) (Bilateral) (R>L) 06/11/2017  . Colon cancer ([place]) 11/2018   Partial colon resection  . DDD (degenerative disc disease), cervical 07/30/2017  . DDD (degenerative disc disease), lumbar 07/30/2017  . Depression   . DISH (diffuse idiopathic skeletal hyperostosis) 07/30/2017  . Dyspnea    with exertion  . Entrapment syndrome 06/20/2017   2002 on R and 2010 on L  . Full thickness rotator cuff tear 06/12/2017  . GERD (gastroesophageal reflux disease)   . Grade 1 Anterolisthesis of L4 over L5 07/30/2017  . Headache   . Hx: UTI (urinary tract infection)   . Hypertension   . Inflammation of joint of shoulder region 01/15/2017  . Lumbar central spinal stenosis (L4-5) 07/30/2017  .  Lumbar disc protrusion (Left: L5-S1) (Right: L1-2) 07/30/2017   L5-S1 left foraminal protrusion with L5 impingement. L1-2 right paracentral protrusion without impingement.  . Lumbar facet arthropathy (Bilateral) 07/30/2017  . Lumbar facet syndrome (Bilateral) (R>L) 07/30/2017  . Lung cancer ([place]) 11/2018   Chemo  and rad tx's  . Osteoarthritis of shoulder (Right) 01/15/2017   Past Surgical History:  Procedure Laterality Date   . ABDOMINAL HYSTERECTOMY    . APPENDECTOMY    . BREAST REDUCTION SURGERY Bilateral   . COLONOSCOPY WITH PROPOFOL N/A 11/05/2018   Procedure: COLONOSCOPY WITH PROPOFOL;  Surgeon: Virgel Manifold, MD;  Location: [facility] ENDOSCOPY;  Service: Endoscopy;  Laterality: N/A;  . COLONOSCOPY WITH PROPOFOL N/A 11/06/2018   Procedure: COLONOSCOPY WITH PROPOFOL;  Surgeon: Virgel Manifold, MD;  Location: [facility] ENDOSCOPY;  Service: Endoscopy;  Laterality: N/A;  . COLONOSCOPY WITH PROPOFOL N/A 07/15/2019   Procedure: COLONOSCOPY WITH PROPOFOL;  Surgeon: Virgel Manifold, MD;  Location: [facility] ENDOSCOPY;  Service: Endoscopy;  Laterality: N/A;  . COLOSTOMY    . DILATION AND CURETTAGE OF UTERUS    . ESOPHAGOGASTRODUODENOSCOPY (EGD) WITH PROPOFOL N/A 11/05/2018   Procedure: ESOPHAGOGASTRODUODENOSCOPY (EGD) WITH PROPOFOL;  Surgeon: Virgel Manifold, MD;  Location: [facility] ENDOSCOPY;  Service: Endoscopy;  Laterality: N/A;  . ESOPHAGOGASTRODUODENOSCOPY (EGD) WITH PROPOFOL N/A 03/27/2019   Procedure: ESOPHAGOGASTRODUODENOSCOPY (EGD) WITH PROPOFOL;  Surgeon: Lucilla Lame, MD;  Location: [facility] ENDOSCOPY;  Service: Endoscopy;  Laterality: N/A;  . ILEOSTOMY Right 11/27/2018   Procedure: ILEOSTOMY;  Surgeon: Jules Husbands, MD;  Location: [facility] ORS;  Service: General;  Laterality: Right;  . ILEOSTOMY CLOSURE N/A 08/18/2019   Procedure: ILEOSTOMY TAKEDOWN;  Surgeon: Jules Husbands, MD;  Location: [facility] ORS;  Service: General;  Laterality: N/A;  . LAPAROSCOPIC SIGMOID COLECTOMY N/A 11/27/2018   Procedure: LAPAROSCOPIC SIGMOID COLECTOMY;  Surgeon: Jules Husbands, MD;  Location: [facility] ORS;  Service: General;  Laterality: N/A;  . ORIF ANKLE FRACTURE Right 11/21/2018   Procedure: OPEN REDUCTION INTERNAL FIXATION (ORIF) ANKLE FRACTURE;  Surgeon: Corky Mull, MD;  Location: [facility] ORS;  Service: Orthopedics;  Laterality: Right;  . PORT-A-CATH REMOVAL Right 08/18/2019   Procedure: REMOVAL PORT-A-CATH;  Surgeon: Jules Husbands, MD;   Location: [facility] ORS;  Service: General;  Laterality: Right;  . PORTACATH PLACEMENT N/A 03/12/2019   Procedure: INSERTION PORT-A-CATH;  Surgeon: Jules Husbands, MD;  Location: [facility] ORS;  Service: General;  Laterality: N/A;  . REDUCTION MAMMAPLASTY    . SHOULDER ARTHROSCOPY WITH OPEN ROTATOR CUFF REPAIR AND DISTAL CLAVICLE ACROMINECTOMY Right 03/26/2017   Procedure: right shoulder arthroscopy, arthroscopic subacromial decompression, distal clavicle excision, mini open rotator cuff repair;  Surgeon: Thornton Park, MD;  Location: [facility] ORS;  Service: Orthopedics;  Laterality: Right;   Social History:  reports that she quit smoking about 10 years ago. Her smoking use included cigarettes. She has a 15.00 pack-year smoking history. She has never used smokeless tobacco. She reports current drug use. Drug: Marijuana. She reports that she does not drink alcohol.  Allergies  Allergen Reactions  . Ace Inhibitors Swelling  . Angiotensin Receptor Blockers Swelling  . Sulfa Antibiotics Itching  . Chlorthalidone Rash  . Flexeril [Cyclobenzaprine] Rash   Family History  Problem Relation Age of Onset  . Hypertension Mother   . Breast cancer Mother 2  . Breast cancer Sister 53    Prior to Admission medications   Medication Sig Start Date End Date Taking? Authorizing Provider  albuterol (VENTOLIN HFA) 108 (90 Base) MCG/ACT  inhaler Inhale 2 puffs into the lungs every 6 (six) hours as needed for wheezing or shortness of breath. 03/27/19  Yes Wieting, Richard, MD  amLODipine (NORVASC) 5 MG tablet Take 5 mg by mouth daily with lunch.    Yes [provider]  benzonatate (TESSALON) 200 MG capsule Take 200 mg by mouth 3 (three) times daily as needed for cough.   Yes [provider]  BREO ELLIPTA 200-25 MCG/INH AEPB Inhale 1 puff into the lungs daily. Patient taking differently: Inhale 1 puff into the lungs every morning.  03/27/19  Yes Wieting, Richard, MD  cloNIDine (CATAPRES) 0.2 MG tablet Take  0.2 mg by mouth 2 (two) times daily.   Yes [provider]  hydrALAZINE (APRESOLINE) 100 MG tablet Take 1 tablet (100 mg total) by mouth 3 (three) times daily. 03/27/19  Yes Wieting, Richard, MD  hydrOXYzine (ATARAX/VISTARIL) 10 MG tablet Take 1 tablet (10 mg total) by mouth 3 (three) times daily as needed for itching. 03/27/19  Yes Wieting, Richard, MD  loratadine (CLARITIN) 10 MG tablet Take 1 tablet (10 mg total) by mouth daily. Patient taking differently: Take 10 mg by mouth every morning.  07/22/19  Yes Pabon, Diego F, MD  lovastatin (MEVACOR) 40 MG tablet Take 1 tablet (40 mg total) by mouth at bedtime. 03/27/19  Yes Wieting, Richard, MD  meclizine (ANTIVERT) 12.5 MG tablet Take 1 tablet (12.5 mg total) by mouth 3 (three) times daily as needed for dizziness. 03/27/19  Yes Wieting, Richard, MD  metoprolol succinate (TOPROL-XL) 50 MG 24 hr tablet Take 50 mg by mouth daily with lunch. Take with or immediately following a meal.    Yes [provider]  mirtazapine (REMERON) 15 MG tablet Take 15 mg by mouth at bedtime.   Yes [provider]  montelukast (SINGULAIR) 10 MG tablet Take 1 tablet (10 mg total) by mouth at bedtime as needed. Patient taking differently: Take 10 mg by mouth at bedtime.  03/27/19  Yes Wieting, Richard, MD  omeprazole (PRILOSEC) 40 MG capsule Take 1 capsule (40 mg total) by mouth 2 (two) times daily. Patient taking differently: Take 40 mg by mouth 2 (two) times daily as needed (acid reflux/indigestion.).  03/27/19  Yes Wieting, Richard, MD  potassium chloride SA (K-DUR) 20 MEQ tablet Take 1 tablet (20 mEq total) by mouth 2 (two) times daily. Patient taking differently: Take 20 mEq by mouth daily.  06/25/19  Yes Sindy Guadeloupe, MD  pregabalin (LYRICA) 50 MG capsule Take 1 capsule (50 mg total) by mouth at bedtime for 30 days, THEN 1 capsule (50 mg total) 2 (two) times daily. 07/29/19 10/27/19 Yes Gillis Santa, MD  sertraline (ZOLOFT) 100 MG tablet Take 2  tablets (200 mg total) by mouth at bedtime. 03/27/19  Yes Wieting, Richard, MD  terazosin (HYTRIN) 1 MG capsule Take 1 capsule (1 mg total) by mouth at bedtime. 03/27/19  Yes Wieting, Richard, MD  tiZANidine (ZANAFLEX) 4 MG tablet Take 1 tablet (4 mg total) by mouth 2 (two) times daily as needed for muscle spasms. Patient taking differently: Take 4 mg by mouth 2 (two) times daily.  07/29/19 08/28/19 Yes Gillis Santa, MD  amLODipine (NORVASC) 10 MG tablet Take 1 tablet (10 mg total) by mouth daily. Patient not taking: Reported on 08/05/2019 03/27/19   Loletha Grayer, MD  lidocaine-prilocaine (EMLA) cream Apply 1 application topically as directed. Place small amt over port site 1 hour before coming in cancer center for port flush Patient not taking:  Reported on 08/05/2019 07/09/19   Sindy Guadeloupe, MD  loperamide (IMODIUM) 2 MG capsule Take 2 capsules (4 mg total) by mouth 3 (three) times daily. Patient not taking: Reported on 08/05/2019 05/27/19   Caroleen Hamman F, MD  metoprolol succinate (TOPROL-XL) 100 MG 24 hr tablet Take 1 tablet (100 mg total) by mouth daily. Take with or immediately following a meal. Patient not taking: Reported on 08/05/2019 03/27/19   Loletha Grayer, MD  OLANZapine (ZYPREXA) 10 MG tablet Take 1 tablet (10 mg total) by mouth at bedtime. Patient not taking: Reported on 07/29/2019 07/09/19   Sindy Guadeloupe, MD  ondansetron (ZOFRAN) 8 MG tablet Take 1 tablet (8 mg total) by mouth every 8 (eight) hours as needed for nausea. Start on day 3 after carboplatin chemo. Patient not taking: Reported on 08/05/2019 07/09/19   Sindy Guadeloupe, MD   Physical Exam: Blood pressure (!) 138/59, pulse 83, temperature 98.7 F (37.1 C), temperature source Oral, resp. rate 15, height 5\' 5"  (1.651 m), weight 70.9 kg, SpO2 99 %. Vitals:   08/22/19 1132 08/22/19 1156  BP: (!) 114/49 (!) 138/59  Pulse: 80 83  Resp: 15 15  Temp: 99.1 F (37.3 C) 98.7 F (37.1 C)  SpO2: 100% 99%     General:   [place]/NT  Eyes: EOMI  ENT: WNL  Neck: No LAD  Cardiovascular: RRR/ no murmur.  Respiratory: CLTABL except for rt upper 1/3 pt has insp and expiratory wheezing  Abdomen: Distended/ NT/ Hypoactive BS.  Skin: WNL  Musculoskeletal: WNL  Psychiatric: Appropriate affect  Neurologic: CN grossly intact.  Labs on Admission:  Basic Metabolic Panel: Recent Labs  Lab 08/18/19 1538 08/19/19 0453 08/21/19 0635 08/22/19 0559  NA  --  138 140 140  K  --  4.8 3.7 3.4*  CL  --  108 110 109  CO2  --  21* 20* 21*  GLUCOSE  --  146* 148* 143*  BUN  --  25* 10 8  CREATININE 0.87 1.11* 0.66 0.64  CALCIUM  --  8.8* 8.5* 8.4*  MG  --   --  1.7  --   PHOS  --   --  2.2*  --    Liver Function Tests: Recent Labs  Lab 08/21/19 0635 08/22/19 0559  AST 24 23  ALT 16 19  ALKPHOS 85 107  BILITOT 0.5 0.6  PROT 6.4* 6.3*  ALBUMIN 2.9* 2.8*   No results for input(s): LIPASE, AMYLASE in the last 168 hours. No results for input(s): AMMONIA in the last 168 hours. CBC: Recent Labs  Lab 08/18/19 1538 08/19/19 0453 08/21/19 0635 08/22/19 0559  WBC 20.1* 17.6* 12.1* 13.7*  HGB 13.7 11.2* 9.3* 9.8*  HCT 41.4 35.0* 29.3* 29.9*  MCV 86.8 89.5 90.2 86.9  PLT 251 219 196 218   Cardiac Enzymes: No results for input(s): CKTOTAL, CKMB, CKMBINDEX, TROPONINI in the last 168 hours. BNP: Invalid input(s): POCBNP CBG: Recent Labs  Lab 08/20/19 1517  GLUCAP 245*    Radiological Exams on Admission: Ct Abdomen Pelvis W Contrast  Result Date: 08/22/2019 CLINICAL DATA:  Fever. Worsening abdominal pain after ileostomy reversal 4 days ago. EXAM: CT ABDOMEN AND PELVIS WITH CONTRAST TECHNIQUE: Multidetector CT imaging of the abdomen and pelvis was performed using the standard protocol following bolus administration of intravenous contrast. CONTRAST:  147mL OMNIPAQUE IOHEXOL 300 MG/ML  SOLN COMPARISON:  06/05/2019 FINDINGS: Lower chest: Patchy ground-glass attenuation noted central right lower lobe.  Small right pleural effusion is  associated with right base atelectasis. Hepatobiliary: Stable tiny cyst inferior right hepatic lobe. Gallbladder surgically absent. No intrahepatic or extrahepatic biliary dilation. Pancreas: No focal mass lesion. No dilatation of the main duct. No intraparenchymal cyst. No peripancreatic edema. Spleen: No splenomegaly. No focal mass lesion. Adrenals/Urinary Tract: No adrenal nodule or mass. Tiny calcifications in the hilum of the left kidney are vascular, best appreciated on delayed imaging. 5 mm low-density lesion upper pole left kidney is too small to characterize but likely benign. Right kidney unremarkable. No evidence for hydroureter. The urinary bladder appears normal for the degree of distention. Stomach/Bowel: Stomach is unremarkable. No gastric wall thickening. No evidence of outlet obstruction. Duodenum is normally positioned as is the ligament of Treitz. No small bowel wall thickening. No small bowel dilatation. Status post ileal anastomosis right lower quadrant in the region of the distal ileum. Terminal ileum unremarkable. Oral contrast material has migrated through the ileal anastomosis and terminal ileum and is just starting to opacified the cecum. No evidence for contrast extravasation at the ileal anastomosis. The appendix is not visualized, but there is no edema or inflammation in the region of the cecum. Status post left hemicolectomy with rectal anastomosis. Vascular/Lymphatic: There is abdominal aortic atherosclerosis without aneurysm. There is no gastrohepatic or hepatoduodenal ligament lymphadenopathy. No intraperitoneal or retroperitoneal lymphadenopathy. No pelvic sidewall lymphadenopathy. Reproductive: The uterus is surgically absent. There is no adnexal mass. Other: Small collection of fluid and gas identified right lower anterior abdominal wall near the location of the ileostomy take down. This is not unexpected on postoperative day 4 and may reflect a  tiny residual seroma or hematoma. There is some fluid in the anterior peritoneal cavity of the right lower quadrant around the small bowel anastomosis and cecal tip (see image 61 of series 2). This fluid has attenuation higher than would be expected for serous fluid. Several small gas locules are seen within this fluid and there is edema/stranding in the ileocolic mesentery Musculoskeletal: Gas locules in the subcutaneous fat of the left anterior abdominal wall are likely injection sites. No worrisome lytic or sclerotic osseous abnormality. IMPRESSION: 1. Small fluid collection anterior right lower quadrant around the distal ileal anastomosis and cecal tip. Attenuation of the fluid is higher than would be expected for serous fluid and several small gas bubbles are evident within the fluid. There may be some overlying peritoneal enhancement in this region. Postoperative day 4 would be earlier than expected for abscess and features likely reflect some residual small hematoma/blood products. Extraluminal gas is not unexpected on postoperative day 4. 2. Subcutaneous gas in small subcutaneous fluid collection in the right anterior abdominal wall at the location of ileostomy takedown. This is probably a tiny residual seroma, and again, extraluminal gas is not unexpected 4 days after surgery. 3. No evidence for bowel obstruction or anastomotic leak. The oral contrast has migrated through the ileal anastomosis into the terminal ileum and cecum. Contrast has not yet reached the distal colon although there is no evidence for fluid around the rectal anastomosis. 4. Edema/stranding in the ileocolic mesentery is presumably sequelae of recent surgery. 5. Trace right lower lobe collapse/consolidation with small right pleural effusion. 6.  Aortic Atherosclerois (ICD10-170.0) Electronically Signed   By: Misty Stanley M.D.   On: 08/22/2019 13:14   Dg Abd Acute 2+v W 1v Chest  Result Date: 08/21/2019 CLINICAL DATA:  Fever,  generalized weakness EXAM: DG ABDOMEN ACUTE W/ 1V CHEST COMPARISON:  04/28/2019, CT 06/05/2019 FINDINGS: Single-view chest demonstrates new ground-glass  opacity and consolidation in the right perihilar lung. Smaller focus of airspace disease in the left mid to lower lung, likely corresponding to pulmonary lung nodule. Mild cardiomegaly. No pneumothorax. Postsurgical changes in the right humeral head. Supine and upright views of the abdomen demonstrate no free air beneath the diaphragm. Postsurgical changes in the pelvis. Cutaneous staples over the right lower quadrant. Air distended probable large bowel with fluid levels in the right lower quadrant, no definite dilated small bowel. IMPRESSION: 1. New ground-glass opacity and consolidation in the right perihilar lung concerning for pneumonia. Radiographic follow-up to resolution recommended. Small focus of opacity in the left peripheral mid lung thought to correspond to a pulmonary nodule noted on prior CT 2. Mild air distended right lower quadrant colon, though without small bowel distention to suggest obstruction. These results will be called to the ordering clinician or representative by the Radiologist Assistant, and communication documented in the PACS or zVision Dashboard. Electronically Signed   By: Donavan Foil M.D.   On: 08/21/2019 21:02    Time spent: 40 minutes.   [facility] If 7PM-7AM, please contact night-coverage [email or web address] Password [employer] 08/22/2019, 4:47 PM

## 2019-08-22 NOTE — Progress Notes (Signed)
POD # 4  Febrile W/u revealing pneumonia, she does have couh The scan personally reviewed some fluid around anastomosis but no evidence of leak.  No evidence of free air. SHe is hungry  PE NAD Abd: soft, appropiate incisional tenderness, no peritonitis  A/ p Pneumonia , BC obtained Pending hospitals consult Continue a/bs Keep regular diet DC once ID issue has been sorted

## 2019-08-22 NOTE — Evaluation (Signed)
Occupational Therapy Evaluation Patient Details Name: Gloria Allen MRN: [id number] DOB: 09/12/1946 Today's Date: 08/22/2019    History of Present Illness Jansen Goodpasture is a 64yoF who comes to [facility] on 11/17 for removal of Right IJ portacath, ileostomy takedown, and repair of parastomal hernia. Code stroke called on 11/19 after acute onset slurring of speech, changes to fine motor coordination, hypotension, and HA. Stat head CT negative, neurology not suspecting any ischaemic process.   Clinical Impression   Ms. Weightman was seen for OT evaluation this date. Prior to hospital admission, pt was generally independent with ADL management. She reports using a SPC or 2WW for mobility and does not require physical assistance for ADLs at baseline.  Pt lives alone in a 1 level house with 3 steps to enter and bilateral hand rails. She endorses 1 fall in the past year.  Currently pt demonstrates impairments in activity tolerance and RUE functional use with noted decreased [place] in her dominant hand this date. Pt currently requires minimal assist to perform BADL tasks that require increased exertion or fine motor coordination. Pt educated on safety strategies for home and hospital as well as provided with a fine motor exercise program with instructions to perform highlighted tasks 3 times per day if possible. Pt return verbalizes understanding of education provided. Pt left in room with RN in to administer AM medications.  Pt would benefit from skilled OT services to address noted impairments and functional limitations (see below for any additional details) in order to maximize safety and independence while minimizing falls risk and caregiver burden.  Upon hospital discharge, recommend home health OT to maximize pt safety and return to prior level of function during meaningful occupations of daily life.      Follow Up Recommendations  Home health OT    Equipment Recommendations  None recommended by OT(Pt has necessary  equipment)    Recommendations for Other Services       Precautions / Restrictions Precautions Precautions: Fall      Mobility Bed Mobility Overal bed mobility: Needs Assistance Bed Mobility: Supine to Sit     Supine to sit: Supervision     General bed mobility comments: pain is inhibitory, no physical assis needed, HOB elevated.  Transfers Overall transfer level: Modified independent Equipment used: None                  Balance Overall balance assessment: No apparent balance deficits (not formally assessed)                                         ADL either performed or assessed with clinical judgement   ADL                                         General ADL Comments: Pt requires min A for heavy ADL tasks including bathing and dressing due to decreased activity tolerance and pain with bending at the waist as well as assistance with activities requiring precise fine motor coordination this date. Otherwise, pt is near baseline for ADL management. she reports getting up to her room bathroom independnetly with some increased time/effort.     Vision Baseline Vision/History: Wears glasses(Does not have them) Patient Visual Report: No change from baseline       Perception  Praxis      Pertinent Vitals/Pain Pain Score: 8  Pain Location: Back Pain Descriptors / Indicators: Sore;Aching;Constant Pain Intervention(s): Limited activity within patient's tolerance;Monitored during session;Repositioned     Hand Dominance Right   Extremity/Trunk Assessment Upper Extremity Assessment Upper Extremity Assessment: Generalized weakness;RUE deficits/detail RUE Deficits / Details: RUE (dominant) noted to have minimally decreased [place] as compared to LUE with decreased speed and stability with finger opposition and writing this date. Pt also reports hx of R shoulder repair with pain since the surgery which limits her functionally. Strength  and sensation WFL.   Lower Extremity Assessment Lower Extremity Assessment: Defer to PT evaluation;Generalized weakness       Communication Communication Communication: No difficulties   Cognition Arousal/Alertness: Awake/alert Behavior During Therapy: WFL for tasks assessed/performed Overall Cognitive Status: Within Functional Limits for tasks assessed                                     General Comments  Pt HR and SpO2 monitored and remained WFL t/o assessment.    Exercises Other Exercises Other Exercises: Pt educated on falls prevention strategies, safe use of AE for ADL management and functional mobility, routines modifications to support functional independence and safety during daily routines and [facility] exercises with exercise plan provided this date.   Shoulder Instructions      Home Living Family/patient expects to be discharged to:: Private residence Living Arrangements: Alone Available Help at Discharge: Neighbor;Available PRN/intermittently Type of Home: House Home Access: Stairs to enter [employer] of Steps: 3 Entrance Stairs-Rails: Can reach both Home Layout: One level         Bathroom Toilet: Standard     Home Equipment: Cane - single point;Walker - 2 wheels;Bedside commode;Shower seat          Prior Functioning/Environment Level of Independence: Independent with assistive device(s)                 OT Problem List: Decreased strength;Decreased coordination;Decreased range of motion;Decreased activity tolerance;Decreased safety awareness;Impaired balance (sitting and/or standing);Decreased knowledge of use of DME or AE;Impaired UE functional use      OT Treatment/Interventions: Self-care/ADL training;Balance training;Therapeutic exercise;Therapeutic activities;Energy conservation;DME and/or AE instruction;Patient/family education    OT Goals(Current goals can be found in the care plan section) Acute Rehab OT  Goals Patient Stated Goal: regain strength per baseline OT Goal Formulation: With patient Time For Goal Achievement: 09/05/19 Potential to Achieve Goals: Good ADL Goals Pt Will Perform Grooming: with modified independence;sitting(With LRAD PRN for improved safety and functional independence.) Pt Will Perform Upper Body Dressing: sitting;standing;with modified independence(With LRAD PRN for improved safety and functional independence.) Pt Will Perform Lower Body Dressing: sit to/from stand;with modified independence(With LRAD PRN for improved safety and functional independence.)  OT Frequency: Min 1X/week   Barriers to D/C: Decreased caregiver support;Inaccessible home environment          Co-evaluation              AM-PAC OT "6 Clicks" Daily Activity     Outcome Measure Help from another person eating meals?: A Little Help from another person taking care of personal grooming?: A Little Help from another person toileting, which includes using toliet, bedpan, or urinal?: A Little Help from another person bathing (including washing, rinsing, drying)?: A Little Help from another person to put on and taking off regular upper body clothing?: A Little Help from another person  to put on and taking off regular lower body clothing?: A Little 6 Click Score: 18   End of Session Equipment Utilized During Treatment: Gait belt;Rolling walker  Activity Tolerance: Patient tolerated treatment well Patient left: in bed;with call bell/phone within reach;with nursing/sitter in room  OT Visit Diagnosis: Other abnormalities of gait and mobility (R26.89);Pain;Muscle weakness (generalized) (M62.81) Pain - Right/Left: (Both) Pain - part of body: (Back/amdominal pain)                Time: 3704-8889 OT Time Calculation (min): 26 min Charges:  OT General Charges $OT Visit: 1 Visit OT Evaluation $OT Eval Moderate Complexity: 1 Mod OT Treatments $Self Care/Home Management : 8-22 mins  Shara Blazing, M.S., OTR/L Ascom: [phone] 08/22/19, 10:30 AM

## 2019-08-23 ENCOUNTER — Encounter: Payer: Self-pay | Admitting: Radiology

## 2019-08-23 ENCOUNTER — Inpatient Hospital Stay: Payer: Medicare Other

## 2019-08-23 DIAGNOSIS — K219 Gastro-esophageal reflux disease without esophagitis: Secondary | ICD-10-CM

## 2019-08-23 DIAGNOSIS — C189 Malignant neoplasm of colon, unspecified: Secondary | ICD-10-CM

## 2019-08-23 LAB — CBC WITH DIFFERENTIAL/PLATELET
Abs Immature Granulocytes: 0.14 10*3/uL — ABNORMAL HIGH (ref 0.00–0.07)
Basophils Absolute: 0 10*3/uL (ref 0.0–0.1)
Basophils Relative: 0 %
Eosinophils Absolute: 0.2 10*3/uL (ref 0.0–0.5)
Eosinophils Relative: 2 %
HCT: 28.5 % — ABNORMAL LOW (ref 36.0–46.0)
Hemoglobin: 9.3 g/dL — ABNORMAL LOW (ref 12.0–15.0)
Immature Granulocytes: 1 %
Lymphocytes Relative: 6 %
Lymphs Abs: 0.8 10*3/uL (ref 0.7–4.0)
MCH: 27.8 pg (ref 26.0–34.0)
MCHC: 32.6 g/dL (ref 30.0–36.0)
MCV: 85.1 fL (ref 80.0–100.0)
Monocytes Absolute: 1 10*3/uL (ref 0.1–1.0)
Monocytes Relative: 8 %
Neutro Abs: 10.6 10*3/uL — ABNORMAL HIGH (ref 1.7–7.7)
Neutrophils Relative %: 83 %
Platelets: 227 10*3/uL (ref 150–400)
RBC: 3.35 MIL/uL — ABNORMAL LOW (ref 3.87–5.11)
RDW: 13.7 % (ref 11.5–15.5)
WBC: 12.7 10*3/uL — ABNORMAL HIGH (ref 4.0–10.5)
nRBC: 0 % (ref 0.0–0.2)

## 2019-08-23 LAB — COMPREHENSIVE METABOLIC PANEL
ALT: 21 U/L (ref 0–44)
AST: 21 U/L (ref 15–41)
Albumin: 2.7 g/dL — ABNORMAL LOW (ref 3.5–5.0)
Alkaline Phosphatase: 125 U/L (ref 38–126)
Anion gap: 13 (ref 5–15)
BUN: 8 mg/dL (ref 8–23)
CO2: 21 mmol/L — ABNORMAL LOW (ref 22–32)
Calcium: 8.6 mg/dL — ABNORMAL LOW (ref 8.9–10.3)
Chloride: 104 mmol/L (ref 98–111)
Creatinine, Ser: 0.72 mg/dL (ref 0.44–1.00)
GFR calc Af Amer: >60 mL/min (ref >60)
GFR calc non Af Amer: >60 mL/min (ref >60)
Glucose, Bld: 137 mg/dL — ABNORMAL HIGH (ref 70–99)
Potassium: 3.3 mmol/L — ABNORMAL LOW (ref 3.5–5.1)
Sodium: 138 mmol/L (ref 135–145)
Total Bilirubin: 0.6 mg/dL (ref 0.3–1.2)
Total Protein: 6.3 g/dL — ABNORMAL LOW (ref 6.5–8.1)

## 2019-08-23 LAB — MRSA PCR SCREENING: MRSA by PCR: NEGATIVE

## 2019-08-23 LAB — MAGNESIUM: Magnesium: 1.7 mg/dL (ref 1.7–2.4)

## 2019-08-23 LAB — PHOSPHORUS: Phosphorus: 2.8 mg/dL (ref 2.5–4.6)

## 2019-08-23 MED ORDER — MAGNESIUM SULFATE 2 GM/50ML IV SOLN
2.0000 g | Freq: Once | INTRAVENOUS | Status: AC
  Administered 2019-08-23: 2 g via INTRAVENOUS
  Filled 2019-08-23: qty 50

## 2019-08-23 MED ORDER — ALPRAZOLAM 0.5 MG PO TABS
0.5000 mg | ORAL_TABLET | Freq: Three times a day (TID) | ORAL | Status: DC | PRN
  Administered 2019-08-23 – 2019-08-28 (×7): 0.5 mg via ORAL
  Filled 2019-08-23 (×7): qty 1

## 2019-08-23 MED ORDER — VANCOMYCIN HCL 1.25 G IV SOLR
1250.0000 mg | INTRAVENOUS | Status: DC
  Administered 2019-08-24: 1250 mg via INTRAVENOUS
  Filled 2019-08-23 (×2): qty 1250

## 2019-08-23 MED ORDER — POTASSIUM CHLORIDE CRYS ER 20 MEQ PO TBCR
40.0000 meq | EXTENDED_RELEASE_TABLET | Freq: Once | ORAL | Status: AC
  Administered 2019-08-23: 40 meq via ORAL
  Filled 2019-08-23: qty 2

## 2019-08-23 MED ORDER — HYDRALAZINE HCL 50 MG PO TABS
75.0000 mg | ORAL_TABLET | Freq: Four times a day (QID) | ORAL | Status: DC
  Administered 2019-08-23 – 2019-08-28 (×21): 75 mg via ORAL
  Filled 2019-08-23 (×21): qty 1

## 2019-08-23 MED ORDER — VANCOMYCIN HCL 10 G IV SOLR
1750.0000 mg | Freq: Once | INTRAVENOUS | Status: AC
  Administered 2019-08-23: 1750 mg via INTRAVENOUS
  Filled 2019-08-23: qty 1750

## 2019-08-23 MED ORDER — IPRATROPIUM-ALBUTEROL 0.5-2.5 (3) MG/3ML IN SOLN
3.0000 mL | Freq: Four times a day (QID) | RESPIRATORY_TRACT | Status: DC
  Administered 2019-08-23 (×2): 3 mL via RESPIRATORY_TRACT
  Filled 2019-08-23 (×2): qty 3

## 2019-08-23 MED ORDER — AMLODIPINE BESYLATE 10 MG PO TABS
10.0000 mg | ORAL_TABLET | Freq: Every day | ORAL | Status: DC
  Administered 2019-08-23 – 2019-08-27 (×5): 10 mg via ORAL
  Filled 2019-08-23 (×5): qty 1

## 2019-08-23 MED ORDER — IOHEXOL 350 MG/ML SOLN
75.0000 mL | Freq: Once | INTRAVENOUS | Status: AC | PRN
  Administered 2019-08-23: 75 mL via INTRAVENOUS

## 2019-08-23 NOTE — Progress Notes (Signed)
CC: ileostomy takedown Subjective: pneumonia, febrile again yesterday. Taking PO No N/V  Objective: Vital signs in last 24 hours: Temp:  [98.9 F (37.2 C)-102.4 F (39.1 C)] 101.1 F (38.4 C) (11/22 1329) Pulse Rate:  [95-104] 99 (11/22 1206) Resp:  [18-20] 18 (11/22 1206) BP: (156-188)/(56-76) 156/56 (11/22 1329) SpO2:  [94 %-97 %] 95 % (11/22 1206) Last BM Date: 08/22/19  Intake/Output from previous day: 11/21 0701 - 11/22 0700 In: 59.4 [I.V.:0.8; IV Piggyback:58.6] Out: -  Intake/Output this shift: No intake/output data recorded.  Physical exam:  NAD  Abd: soft, mild TTP, incisions healing well, given persistent fever I removed one staple and inserted a q tip. Some scan serous fluid but no pus. No peritonitis  Lab Results: CBC  Recent Labs    08/22/19 0559 08/23/19 0612  WBC 13.7* 12.7*  HGB 9.8* 9.3*  HCT 29.9* 28.5*  PLT 218 227   BMET Recent Labs    08/22/19 0559 08/23/19 0612  NA 140 138  K 3.4* 3.3*  CL 109 104  CO2 21* 21*  GLUCOSE 143* 137*  BUN 8 8  CREATININE 0.64 0.72  CALCIUM 8.4* 8.6*   PT/INR No results for input(s): LABPROT, INR in the last 72 hours. ABG No results for input(s): PHART, HCO3 in the last 72 hours.  Invalid input(s): PCO2, PO2  Studies/Results: Ct Abdomen Pelvis W Contrast  Result Date: 08/22/2019 CLINICAL DATA:  Fever. Worsening abdominal pain after ileostomy reversal 4 days ago. EXAM: CT ABDOMEN AND PELVIS WITH CONTRAST TECHNIQUE: Multidetector CT imaging of the abdomen and pelvis was performed using the standard protocol following bolus administration of intravenous contrast. CONTRAST:  177mL OMNIPAQUE IOHEXOL 300 MG/ML  SOLN COMPARISON:  06/05/2019 FINDINGS: Lower chest: Patchy ground-glass attenuation noted central right lower lobe. Small right pleural effusion is associated with right base atelectasis. Hepatobiliary: Stable tiny cyst inferior right hepatic lobe. Gallbladder surgically absent. No intrahepatic or  extrahepatic biliary dilation. Pancreas: No focal mass lesion. No dilatation of the main duct. No intraparenchymal cyst. No peripancreatic edema. Spleen: No splenomegaly. No focal mass lesion. Adrenals/Urinary Tract: No adrenal nodule or mass. Tiny calcifications in the hilum of the left kidney are vascular, best appreciated on delayed imaging. 5 mm low-density lesion upper pole left kidney is too small to characterize but likely benign. Right kidney unremarkable. No evidence for hydroureter. The urinary bladder appears normal for the degree of distention. Stomach/Bowel: Stomach is unremarkable. No gastric wall thickening. No evidence of outlet obstruction. Duodenum is normally positioned as is the ligament of Treitz. No small bowel wall thickening. No small bowel dilatation. Status post ileal anastomosis right lower quadrant in the region of the distal ileum. Terminal ileum unremarkable. Oral contrast material has migrated through the ileal anastomosis and terminal ileum and is just starting to opacified the cecum. No evidence for contrast extravasation at the ileal anastomosis. The appendix is not visualized, but there is no edema or inflammation in the region of the cecum. Status post left hemicolectomy with rectal anastomosis. Vascular/Lymphatic: There is abdominal aortic atherosclerosis without aneurysm. There is no gastrohepatic or hepatoduodenal ligament lymphadenopathy. No intraperitoneal or retroperitoneal lymphadenopathy. No pelvic sidewall lymphadenopathy. Reproductive: The uterus is surgically absent. There is no adnexal mass. Other: Small collection of fluid and gas identified right lower anterior abdominal wall near the location of the ileostomy take down. This is not unexpected on postoperative day 4 and may reflect a tiny residual seroma or hematoma. There is some fluid in the anterior peritoneal cavity of  the right lower quadrant around the small bowel anastomosis and cecal tip (see image 61 of  series 2). This fluid has attenuation higher than would be expected for serous fluid. Several small gas locules are seen within this fluid and there is edema/stranding in the ileocolic mesentery Musculoskeletal: Gas locules in the subcutaneous fat of the left anterior abdominal wall are likely injection sites. No worrisome lytic or sclerotic osseous abnormality. IMPRESSION: 1. Small fluid collection anterior right lower quadrant around the distal ileal anastomosis and cecal tip. Attenuation of the fluid is higher than would be expected for serous fluid and several small gas bubbles are evident within the fluid. There may be some overlying peritoneal enhancement in this region. Postoperative day 4 would be earlier than expected for abscess and features likely reflect some residual small hematoma/blood products. Extraluminal gas is not unexpected on postoperative day 4. 2. Subcutaneous gas in small subcutaneous fluid collection in the right anterior abdominal wall at the location of ileostomy takedown. This is probably a tiny residual seroma, and again, extraluminal gas is not unexpected 4 days after surgery. 3. No evidence for bowel obstruction or anastomotic leak. The oral contrast has migrated through the ileal anastomosis into the terminal ileum and cecum. Contrast has not yet reached the distal colon although there is no evidence for fluid around the rectal anastomosis. 4. Edema/stranding in the ileocolic mesentery is presumably sequelae of recent surgery. 5. Trace right lower lobe collapse/consolidation with small right pleural effusion. 6.  Aortic Atherosclerois (ICD10-170.0) Electronically Signed   By: Misty Stanley M.D.   On: 08/22/2019 13:14   US Venous Img Lower Bilateral (dvt)  Result Date: 08/22/2019 CLINICAL DATA:  Dyspnea.  Inpatient. EXAM: BILATERAL LOWER EXTREMITY VENOUS DOPPLER ULTRASOUND TECHNIQUE: Gray-scale sonography with graded compression, as well as color Doppler and duplex ultrasound  were performed to evaluate the lower extremity deep venous systems from the level of the common femoral vein and including the common femoral, femoral, profunda femoral, popliteal and calf veins including the posterior tibial, peroneal and gastrocnemius veins when visible. The superficial great saphenous vein was also interrogated. Spectral Doppler was utilized to evaluate flow at rest and with distal augmentation maneuvers in the common femoral, femoral and popliteal veins. COMPARISON:  None. FINDINGS: RIGHT LOWER EXTREMITY Common Femoral Vein: No evidence of thrombus. Normal compressibility, respiratory phasicity and response to augmentation. Saphenofemoral Junction: No evidence of thrombus. Normal compressibility and flow on color Doppler imaging. Profunda Femoral Vein: No evidence of thrombus. Normal compressibility and flow on color Doppler imaging. Femoral Vein: No evidence of thrombus. Normal compressibility, respiratory phasicity and response to augmentation. Popliteal Vein: No evidence of thrombus. Normal compressibility, respiratory phasicity and response to augmentation. Calf Veins: No evidence of thrombus. Normal compressibility and flow on color Doppler imaging. Superficial Great Saphenous Vein: No evidence of thrombus. Normal compressibility. Venous Reflux:  None. Other Findings:  None. LEFT LOWER EXTREMITY Common Femoral Vein: No evidence of thrombus. Normal compressibility, respiratory phasicity and response to augmentation. Saphenofemoral Junction: No evidence of thrombus. Normal compressibility and flow on color Doppler imaging. Profunda Femoral Vein: No evidence of thrombus. Normal compressibility and flow on color Doppler imaging. Femoral Vein: No evidence of thrombus. Normal compressibility, respiratory phasicity and response to augmentation. Popliteal Vein: No evidence of thrombus. Normal compressibility, respiratory phasicity and response to augmentation. Calf Veins: No evidence of thrombus.  Normal compressibility and flow on color Doppler imaging. Superficial Great Saphenous Vein: No evidence of thrombus. Normal compressibility. Venous Reflux:  None. Other Findings:  None. IMPRESSION: No evidence of deep venous thrombosis in either lower extremity. Electronically Signed   By: Ilona Sorrel M.D.   On: 08/22/2019 18:33   Dg Abd Acute 2+v W 1v Chest  Result Date: 08/21/2019 CLINICAL DATA:  Fever, generalized weakness EXAM: DG ABDOMEN ACUTE W/ 1V CHEST COMPARISON:  04/28/2019, CT 06/05/2019 FINDINGS: Single-view chest demonstrates new ground-glass opacity and consolidation in the right perihilar lung. Smaller focus of airspace disease in the left mid to lower lung, likely corresponding to pulmonary lung nodule. Mild cardiomegaly. No pneumothorax. Postsurgical changes in the right humeral head. Supine and upright views of the abdomen demonstrate no free air beneath the diaphragm. Postsurgical changes in the pelvis. Cutaneous staples over the right lower quadrant. Air distended probable large bowel with fluid levels in the right lower quadrant, no definite dilated small bowel. IMPRESSION: 1. New ground-glass opacity and consolidation in the right perihilar lung concerning for pneumonia. Radiographic follow-up to resolution recommended. Small focus of opacity in the left peripheral mid lung thought to correspond to a pulmonary nodule noted on prior CT 2. Mild air distended right lower quadrant colon, though without small bowel distention to suggest obstruction. These results will be called to the ordering clinician or representative by the Radiologist Assistant, and communication documented in the PACS or zVision Dashboard. Electronically Signed   By: Donavan Foil M.D.   On: 08/21/2019 21:02    Anti-infectives: Anti-infectives (From admission, onward)   Start     Dose/Rate Route Frequency Ordered Stop   08/24/19 1600  vancomycin (VANCOCIN) 1,250 mg in sodium chloride 0.9 % 250 mL IVPB     1,250  mg 166.7 mL/hr over 90 Minutes Intravenous Every 24 hours 08/23/19 1446     08/23/19 1530  vancomycin (VANCOCIN) 1,750 mg in sodium chloride 0.9 % 500 mL IVPB     1,750 mg 250 mL/hr over 120 Minutes Intravenous  Once 08/23/19 1441     08/21/19 1800  piperacillin-tazobactam (ZOSYN) IVPB 3.375 g     3.375 g 12.5 mL/hr over 240 Minutes Intravenous Every 8 hours 08/21/19 1749     08/19/19 0600  cefoTEtan (CEFOTAN) 2 g in sodium chloride 0.9 % 100 mL IVPB     2 g 200 mL/hr over 30 Minutes Intravenous On call to O.R. 08/18/19 1506 08/19/19 0603   08/18/19 0600  cefoTEtan (CEFOTAN) 2 g in sodium chloride 0.9 % 100 mL IVPB     2 g 200 mL/hr over 30 Minutes Intravenous On call to O.R. 08/17/19 2242 08/18/19 1053      Assessment/Plan: Ileostomy takedown w persistent fevers pedning CT chest r/o PE I am going to add vanc to cover MRSA Difficult case, she is anxious but I assured her that we are working towards her well being and health From abdominal perspective she seems to be doing well, We may have to do another image in the next 24 -48 hrs    Caroleen Hamman, MD, FACS  08/23/2019

## 2019-08-23 NOTE — Progress Notes (Signed)
A consult was placed to IV Team for a new 20ga ; pt to have CT scan done;  Staff RN attempted x 1;  Pt is extremely anxious; very upset, saying "they are mistreating me here;"  Pt received at least 3 phone calls while assessing for iv site; pt repeated the same phrase;  "they are mistreating me here;"  Able to start nsl on the 2nd attempt, as pt screams and clamps down with ultrasound iv start;  Good blood return noted from new iv, flushed with 20cc NS;  Barnabas Lister, RN in the room during restart;

## 2019-08-23 NOTE — Progress Notes (Addendum)
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 The hospitalist service was consulted out of concern for possible aspiration pneumonia resulting in fevers and dyspnea by the patient on 08/22/2019.  She has a past medical history is significant for hypertension, colon cancer, chronic pain, DDD of cervical and lumbar vertebral bodies, small cell lung CA depression, entrapment syndrome, GERD, headaches, chronic itching and UTI.  Abdominal film performed on 08/21/2019 has demonstrated new ground-glass opacity and consolidation in the right perihilar lung concerning for pneumonia. There is also a small focus of opacity in the left peripheral mid lung thought to correspond to a pulmonary nodule noted on prior CT. She has also been febrile and dyspneic with a non-productive cough. She is receiving IV Zosyn and breathing treatments.  Consultants  . 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 08/18/19 1506 08/19/19 0603   08/18/19 0600  cefoTEtan (CEFOTAN) 2 g in sodium chloride 0.9 % 100 mL IVPB     2 g 200 mL/hr over 30 Minutes Intravenous On call to O.R. 08/17/19 2242 08/18/19 1053    .  Subjective  The patient is resting comfortably.  No new complaints.  Objective   Vitals:  Vitals:   08/23/19 1132 08/23/19 1206  BP: (!) 184/67 (!) 186/76  Pulse:  99  Resp:  18  Temp:  (!) 101.5 F (38.6 C)  SpO2:  95%   Exam:  Constitutional:  . The patient is awake, alert, and oriented x 3. No acute distress. Respiratory:  . No increased work of breathing. Marland Kitchen Positive for wheezing on the right side. No rales or rhonchi. . No tactile fremitus Cardiovascular:  . Regular rate and rhythm . No murmurs, ectopy, or gallups. . No lateral PMI. No thrills. Abdomen:  . Abdomen is soft, slightly tender and distended . No hernias, masses, or organomegaly . Hypoactive bowel sounds.  Musculoskeletal:  . No cyanosis, clubbing, or edema Skin:  . No rashes, lesions, ulcers . palpation of skin: no induration or nodules Neurologic:  . CN 2-12 intact . Sensation all 4 extremities intact Psychiatric:  . Mental status o Mood, affect appropriate o Orientation to person, place, time  . judgment and insight appear intact  I have personally reviewed the following:   Today's Data  . Vitals, CMP, CBC  Micro Data  . Blood cultures x 2 have had no growth.  Imaging  . CT abdomen and pelvis 11/21/20920 . 2 View abdomen 08/21/2019  Scheduled Meds: . amLODipine  10 mg Oral QHS  . cloNIDine  0.2 mg Oral BID  . feeding supplement (ENSURE ENLIVE)  237 mL Oral BID BM  . fluticasone furoate-vilanterol  1  puff Inhalation BH-q7a  . heparin  5,000 Units Subcutaneous Q8H  . hydrALAZINE  75 mg Oral Q6H  . ipratropium-albuterol  3 mL Nebulization Q6H  . metoprolol succinate  50 mg Oral Q lunch  . mirtazapine  15 mg Oral QHS  . montelukast  10 mg Oral QHS  . pantoprazole  40 mg Oral QHS  . potassium chloride SA  20 mEq Oral Daily  . pregabalin  75 mg Oral BID  . sertraline  200 mg Oral QHS  . terazosin  1 mg Oral QHS   Continuous Infusions: . lactated ringers 75 mL/hr at 08/23/19 0927  . piperacillin-tazobactam (ZOSYN)  IV 3.375 g  (08/23/19 0819)    Principal Problem:   Aspiration pneumonia of right lung ([place]) Active Problems:   GERD (gastroesophageal reflux disease)   SOB (shortness of breath)   Malignant neoplasm of colon ([facility])   Small cell lung cancer ([place])   Essential hypertension   S/P closure of ileostomy   LOS: 5 days   A & P  Aspiration PNA: The patient is receiving IV Zosyn. Blood cultures x 2 have had no growth.  Pt is post op and suspect she has developed Aspiration pneumonia , she has responded well to zosyn and we will continue. CTA chest with contrast to identify any VTE as pt is at high risk for PE/ DVT has been ordered and is pending. SaO2 is 95% on room air. She continues to have a cough without sputum production.  Small cell lung cancer/ colon cancer: Pt is followed closely by her oncologist .  GERD: PPI therapy continued.  HTN: Meds reviewed. Pt has been continued on her Hydralazine (changed to 50 mg tid) and metoprolol and clonidine continued.  I have seen and examined this patient myself. I have spent 34 minutes in her evaluation and care.  Mykaylah Ballman, DO [facility] Direct contact: see [email or web address]  7PM-7AM contact night coverage as above  08/23/2019, 1:02 PM  LOS: 5 days   ADDENDUM:  I received a message from nursing that the patient had questions about "why she was here:" I explained that I would be happy to talk to the patient about her aspiration pneumonia for which we were consulted, but that I was not able to speak to the larger question regarding why she was here. That would have to be addressed by her attending. I called the patient. She told me that "no one" had been in to see her today. I explained that I had been in the room this morning, examined her and explained that she was getting antibiotics for pneumonia. She states that I didn't tell her anything, and that no one has done anything for her since she has been here. I then apologized for her experience and  offered to answer any questions she might have. She hung up on me. I again suggested to nursing that the attending be made aware of the situation and be given the opportunity to answer questions.

## 2019-08-23 NOTE — Consult Note (Signed)
Pharmacy Antibiotic Note  Gloria Allen is a 73 y.o. female admitted on 08/18/2019 with pneumonia.  Pharmacy has been consulted for Vancomycin dosing. Patient is currently on Zosyn.    Scr 0.72 mg/dL (will round to 0.80 mg/dL)  Plan: MRSA PCR ordered   Vancomycin:   Loading dose: 1750 mg x1 dose  Maintenance dose: 1250 mg Q24H   AUC goal: 400-550   Expected AUC: 465.8   Expected Cssmin: 10.5   Height: 5\' 5"  (165.1 cm) Weight: 156 lb 4.9 oz (70.9 kg) IBW/kg (Calculated) : 57  Temp (24hrs), Avg:101 F (38.3 C), Min:98.9 F (37.2 C), Max:102.4 F (39.1 C)  Recent Labs  Lab 08/18/19 1538 08/19/19 0453 08/21/19 0635 08/22/19 0559 08/23/19 0612  WBC 20.1* 17.6* 12.1* 13.7* 12.7*  CREATININE 0.87 1.11* 0.66 0.64 0.72    Estimated Creatinine Clearance: 63.7 mL/min (by C-G formula based on SCr of 0.72 mg/dL).    Allergies  Allergen Reactions  . Ace Inhibitors Swelling  . Angiotensin Receptor Blockers Swelling  . Sulfa Antibiotics Itching  . Chlorthalidone Rash  . Flexeril [Cyclobenzaprine] Rash    Thank you for allowing pharmacy to be a part of this patient's care.  Rowland Lathe 08/23/2019 2:41 PM

## 2019-08-24 ENCOUNTER — Inpatient Hospital Stay (HOSPITAL_COMMUNITY)
Admission: RE | Admit: 2019-08-24 | Discharge: 2019-08-24 | Disposition: A | Discharge status: Home / Self Care | Payer: Medicare Other | Source: Ambulatory Visit | Attending: Surgery | Admitting: Surgery

## 2019-08-24 DIAGNOSIS — M5030 Other cervical disc degeneration, unspecified cervical region: Secondary | ICD-10-CM

## 2019-08-24 DIAGNOSIS — R509 Fever, unspecified: Secondary | ICD-10-CM

## 2019-08-24 DIAGNOSIS — C3411 Malignant neoplasm of upper lobe, right bronchus or lung: Secondary | ICD-10-CM

## 2019-08-24 DIAGNOSIS — Z87891 Personal history of nicotine dependence: Secondary | ICD-10-CM

## 2019-08-24 DIAGNOSIS — M5136 Other intervertebral disc degeneration, lumbar region: Secondary | ICD-10-CM

## 2019-08-24 DIAGNOSIS — G8929 Other chronic pain: Secondary | ICD-10-CM

## 2019-08-24 DIAGNOSIS — R918 Other nonspecific abnormal finding of lung field: Secondary | ICD-10-CM

## 2019-08-24 DIAGNOSIS — Z881 Allergy status to other antibiotic agents status: Secondary | ICD-10-CM

## 2019-08-24 DIAGNOSIS — Z932 Ileostomy status: Secondary | ICD-10-CM

## 2019-08-24 DIAGNOSIS — I10 Essential (primary) hypertension: Secondary | ICD-10-CM

## 2019-08-24 DIAGNOSIS — D649 Anemia, unspecified: Secondary | ICD-10-CM

## 2019-08-24 DIAGNOSIS — Z888 Allergy status to other drugs, medicaments and biological substances status: Secondary | ICD-10-CM

## 2019-08-24 DIAGNOSIS — Z85038 Personal history of other malignant neoplasm of large intestine: Secondary | ICD-10-CM

## 2019-08-24 LAB — URINALYSIS, ROUTINE W REFLEX MICROSCOPIC
Bilirubin Urine: NEGATIVE
Glucose, UA: NEGATIVE mg/dL
Hgb urine dipstick: NEGATIVE
Ketones, ur: 20 mg/dL — AB
Leukocytes,Ua: NEGATIVE
Nitrite: NEGATIVE
Protein, ur: NEGATIVE mg/dL
Specific Gravity, Urine: 1.024 (ref 1.005–1.030)
pH: 5 (ref 5.0–8.0)

## 2019-08-24 LAB — BASIC METABOLIC PANEL
Anion gap: 12 (ref 5–15)
BUN: 7 mg/dL — ABNORMAL LOW (ref 8–23)
CO2: 23 mmol/L (ref 22–32)
Calcium: 8.6 mg/dL — ABNORMAL LOW (ref 8.9–10.3)
Chloride: 105 mmol/L (ref 98–111)
Creatinine, Ser: 0.73 mg/dL (ref 0.44–1.00)
GFR calc Af Amer: >60 mL/min (ref >60)
GFR calc non Af Amer: >60 mL/min (ref >60)
Glucose, Bld: 140 mg/dL — ABNORMAL HIGH (ref 70–99)
Potassium: 3.5 mmol/L (ref 3.5–5.1)
Sodium: 140 mmol/L (ref 135–145)

## 2019-08-24 LAB — CBC
HCT: 26.4 % — ABNORMAL LOW (ref 36.0–46.0)
Hemoglobin: 8.9 g/dL — ABNORMAL LOW (ref 12.0–15.0)
MCH: 28.6 pg (ref 26.0–34.0)
MCHC: 33.7 g/dL (ref 30.0–36.0)
MCV: 84.9 fL (ref 80.0–100.0)
Platelets: 253 10*3/uL (ref 150–400)
RBC: 3.11 MIL/uL — ABNORMAL LOW (ref 3.87–5.11)
RDW: 13.9 % (ref 11.5–15.5)
WBC: 11.7 10*3/uL — ABNORMAL HIGH (ref 4.0–10.5)
nRBC: 0 % (ref 0.0–0.2)

## 2019-08-24 LAB — ECHOCARDIOGRAM COMPLETE
Height: 65 in
Weight: 2500.9 oz

## 2019-08-24 LAB — PROCALCITONIN: Procalcitonin: 0.24 ng/mL

## 2019-08-24 MED ORDER — IPRATROPIUM-ALBUTEROL 0.5-2.5 (3) MG/3ML IN SOLN
3.0000 mL | Freq: Three times a day (TID) | RESPIRATORY_TRACT | Status: DC
  Administered 2019-08-24 – 2019-08-25 (×4): 3 mL via RESPIRATORY_TRACT
  Filled 2019-08-24 (×4): qty 3

## 2019-08-24 MED ORDER — OLANZAPINE 10 MG PO TABS
10.0000 mg | ORAL_TABLET | Freq: Every day | ORAL | Status: DC
  Administered 2019-08-24 – 2019-08-27 (×4): 10 mg via ORAL
  Filled 2019-08-24 (×5): qty 1

## 2019-08-24 MED ORDER — SODIUM CHLORIDE 0.9 % IV SOLN
INTRAVENOUS | Status: DC | PRN
  Administered 2019-08-24: 30 mL via INTRAVENOUS
  Administered 2019-08-25 – 2019-08-26 (×2): 250 mL via INTRAVENOUS

## 2019-08-24 MED ORDER — METOPROLOL SUCCINATE ER 100 MG PO TB24
100.0000 mg | ORAL_TABLET | Freq: Every day | ORAL | Status: DC
  Administered 2019-08-24 – 2019-08-28 (×5): 100 mg via ORAL
  Filled 2019-08-24 (×5): qty 1

## 2019-08-24 MED ORDER — AMLODIPINE BESYLATE 10 MG PO TABS: 10.0000 mg | ORAL_TABLET | Freq: Every day | ORAL | Status: DC

## 2019-08-24 MED ORDER — IPRATROPIUM-ALBUTEROL 0.5-2.5 (3) MG/3ML IN SOLN: 3.0000 mL | Freq: Four times a day (QID) | RESPIRATORY_TRACT | Status: DC | PRN

## 2019-08-24 NOTE — Progress Notes (Signed)
POD # 6 Febrile still VSS W/u revealing pneumonia, CTA no PE, so far w/u is negative BC negative Taking PO, + flatus and + bm Started on vanc yesterday   PE NAD Alert in good spirits Abd: soft, appropiate incisional tenderness, no peritonitis.  A/ p Pneumonia continue a/bs ,  ID consult Echo for endocarditis , I wonder given association strep bovis  And pt colon CA Keep regular diet

## 2019-08-24 NOTE — Care Management Important Message (Signed)
Important Message  Patient Details  Name: Gloria Allen MRN: [id number] Date of Birth: [birth year]   Medicare Important Message Given:  Yes     Dannette Barbara 08/24/2019, 10:46 AM

## 2019-08-24 NOTE — Progress Notes (Signed)
*  PRELIMINARY RESULTS* Echocardiogram 2D Echocardiogram has been performed.  Gloria Allen 08/24/2019, 1:20 PM

## 2019-08-24 NOTE — Progress Notes (Signed)
Physical Therapy Treatment Patient Details Name: Gloria Allen MRN: [id number] DOB: 08-18-1946 Today's Date: 08/24/2019    History of Present Illness Gloria Allen is a 65yoF who [facility] on 11/17 for removal of Right IJ portacath, ileostomy takedown, and repair of parastomal hernia. Code stroke called on 11/19 after acute onset slurring of speech, changes to fine motor coordination, hypotension, and HA. Stat head CT negative, neurology not suspecting any ischaemic process.    PT Comments    Pt presented with minor deficits in strength, gait, balance, and activity tolerance but performed well during the session and made good progress towards goals.  Pt was steady with good eccentric and concentric control during sit to/from stand transfers from multiple height surfaces and was steady during amb with a RW with improved activity tolerance.  Pt reported no pain during the session or adverse symptoms with activity.  Pt continues to report feeling generally weaker than at baseline and will benefit from HHPT services upon discharge to safely address above deficits for decreased caregiver assistance and eventual return to PLOF.     Follow Up Recommendations  Home health PT     Equipment Recommendations  None recommended by PT    Recommendations for Other Services       Precautions / Restrictions Precautions Precautions: Fall Restrictions Weight Bearing Restrictions: No    Mobility  Bed Mobility Overal bed mobility: Independent             General bed mobility comments: Good speed and effort during sup to sit without the need of the bed rail  Transfers Overall transfer level: Modified independent Equipment used: Rolling walker (2 wheeled)             General transfer comment: Good control and stability during sit to/from stand transfers from various height surfaces  Ambulation/Gait Ambulation/Gait assistance: Supervision Gait Distance (Feet): 150 Feet Assistive  device: Rolling walker (2 wheeled) Gait Pattern/deviations: Step-through pattern;Decreased step length - right;Decreased step length - left Gait velocity: decreased   General Gait Details: Slow cadence but steady with amb without LOB   Stairs             Wheelchair Mobility    Modified Rankin (Stroke Patients Only)       Balance Overall balance assessment: Needs assistance   Sitting balance-Leahy Scale: Normal     Standing balance support: Bilateral upper extremity supported Standing balance-Leahy Scale: Good                              Cognition Arousal/Alertness: Awake/alert Behavior During Therapy: WFL for tasks assessed/performed Overall Cognitive Status: Within Functional Limits for tasks assessed                                        Exercises Total Joint Exercises Ankle Circles/Pumps: Strengthening;Both;5 reps;10 reps Quad Sets: Strengthening;Both;10 reps;5 reps Gluteal Sets: Strengthening;Both;5 reps;10 reps Heel Slides: AROM;Both;10 reps Hip ABduction/ADduction: Strengthening;Both;10 reps Long Arc Quad: Strengthening;Both;10 reps Knee Flexion: Strengthening;Both;10 reps Marching in Standing: AROM;Both;10 reps;Standing Other Exercises Other Exercises: HEP education for BLE APs, QS, GS, and LAQ x 10 each 5-6x/day    General Comments        Pertinent Vitals/Pain Pain Assessment: No/denies pain    Home Living  Prior Function            PT Goals (current goals can now be found in the care plan section) Progress towards PT goals: Progressing toward goals    Frequency    Min 2X/week      PT Plan Current plan remains appropriate    Co-evaluation              AM-PAC PT "6 Clicks" Mobility   Outcome Measure  Help needed turning from your back to your side while in a flat bed without using bedrails?: None Help needed moving from lying on your back to sitting on the side  of a flat bed without using bedrails?: None Help needed moving to and from a bed to a chair (including a wheelchair)?: None Help needed standing up from a chair using your arms (e.g., wheelchair or bedside chair)?: None Help needed to walk in hospital room?: A Little Help needed climbing 3-5 steps with a railing? : A Little 6 Click Score: 22    End of Session   Activity Tolerance: Patient tolerated treatment well Patient left: in chair;with call bell/phone within reach Nurse Communication: Mobility status PT Visit Diagnosis: Unsteadiness on feet (R26.81);Other abnormalities of gait and mobility (R26.89);Muscle weakness (generalized) (M62.81)     Time: 6861-6837 PT Time Calculation (min) (ACUTE ONLY): 23 min  Charges:  $Gait Training: 8-22 mins $Therapeutic Exercise: 8-22 mins                     D. Scott Maston Wight PT, DPT 08/24/19, 3:53 PM

## 2019-08-24 NOTE — Progress Notes (Addendum)
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 The hospitalist service was consulted out of concern for possible aspiration pneumonia resulting in fevers and dyspnea by the patient on 08/22/2019.  She has a past medical history is significant for hypertension, colon cancer, chronic pain, DDD of cervical and lumbar vertebral bodies, small cell lung CA depression, entrapment syndrome, GERD, headaches, chronic itching and UTI.  Abdominal film performed on 08/21/2019 has demonstrated new ground-glass opacity and consolidation in the right perihilar lung concerning for pneumonia. There is also a small focus of opacity in the left peripheral mid lung thought to correspond to a pulmonary nodule noted on prior CT. She has also been febrile and dyspneic with a non-productive cough. She is receiving IV Zosyn and breathing treatments.  Consultants  . 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  Antibiotics   Anti-infectives (From admission, onward)   Start     Dose/Rate Route Frequency Ordered Stop   08/24/19 1600  vancomycin (VANCOCIN) 1,250 mg in sodium chloride 0.9 % 250 mL IVPB     1,250 mg 166.7 mL/hr over 90 Minutes Intravenous Every 24 hours 08/23/19 1446     08/23/19 1530  vancomycin (VANCOCIN) 1,750 mg in sodium chloride 0.9 % 500 mL IVPB     1,750 mg 250 mL/hr over 120 Minutes Intravenous  Once 08/23/19 1441 08/23/19 1727   08/21/19 1800  piperacillin-tazobactam (ZOSYN) IVPB 3.375 g     3.375 g 12.5 mL/hr over 240 Minutes Intravenous Every 8 hours 08/21/19 1749     08/19/19 0600  cefoTEtan (CEFOTAN) 2 g in sodium  chloride 0.9 % 100 mL IVPB     2 g 200 mL/hr over 30 Minutes Intravenous On call to O.R. 08/18/19 1506 08/19/19 0603   08/18/19 0600  cefoTEtan (CEFOTAN) 2 g in sodium chloride 0.9 % 100 mL IVPB     2 g 200 mL/hr over 30 Minutes Intravenous On call to O.R. 08/17/19 2242 08/18/19 1053     Subjective  The patient is resting comfortably. No new complaints.  Objective   Vitals:  Vitals:   08/24/19 0808 08/24/19 1157  BP:  (!) 149/58  Pulse:  92  Resp:  18  Temp:  99.8 F (37.7 C)  SpO2: 94% 94%   Exam:  Constitutional:  . The patient is awake, alert, and oriented x 3. No acute distress. She is in much better spirits today. Respiratory:  . No increased work of breathing. . No wheezes, rales, or rhonchi. . No tactile fremitus Cardiovascular:  . Regular rate and rhythm . No murmurs, ectopy, or gallups. . No lateral PMI. No thrills. Abdomen:  . Abdomen is soft, slightly tender and distended . No hernias, masses, or organomegaly . Hypoactive bowel sounds.  Musculoskeletal:  . No cyanosis, clubbing, or edema Skin:  . No rashes, lesions, ulcers . palpation of skin: no induration or nodules Neurologic:  . CN 2-12 intact . Sensation all 4 extremities intact Psychiatric:  . Mental status o Mood, affect appropriate o Orientation to person, place, time  . judgment and insight appear intact  I have personally reviewed the following:   Today's  Data  . Vitals, CMP, CBC  Micro Data  . Blood cultures x 2 have had no growth.  Imaging  . CT abdomen and pelvis 11/21/20920 . 2 View abdomen 08/21/2019 . CTA chest: negative for PE, demonstrated ground glass opacities in the right upper, middle and lower lobes.   Scheduled Meds: . amLODipine  10 mg Oral QHS  . feeding supplement (ENSURE ENLIVE)  237 mL Oral BID BM  . fluticasone furoate-vilanterol  1 puff Inhalation BH-q7a  . heparin  5,000 Units Subcutaneous Q8H  . hydrALAZINE  75 mg Oral Q6H  . ipratropium-albuterol  3  mL Nebulization TID  . metoprolol succinate  100 mg Oral Daily  . mirtazapine  15 mg Oral QHS  . montelukast  10 mg Oral QHS  . OLANZapine  10 mg Oral QHS  . pantoprazole  40 mg Oral QHS  . potassium chloride SA  20 mEq Oral Daily  . pregabalin  75 mg Oral BID  . sertraline  200 mg Oral QHS  . terazosin  1 mg Oral QHS   Continuous Infusions: . piperacillin-tazobactam (ZOSYN)  IV Stopped (08/24/19 1505)  . vancomycin 166.7 mL/hr at 08/24/19 1700    Principal Problem:   Aspiration pneumonia of right lung ([facility]) Active Problems:   GERD (gastroesophageal reflux disease)   SOB (shortness of breath)   Malignant neoplasm of colon ([facility])   Small cell lung cancer ([place])   Essential hypertension   S/P closure of ileostomy   LOS: 6 days   A & P  Aspiration PNA: The patient is receiving IV Zosyn. Blood cultures x 2 have had no growth.  Pt is post op and suspect she has developed Aspiration pneumonia , she has responded well to zosyn and we will continue. CTA chest was negative for PE. Doppler of lower extremities bilaterally was also negative for DVT. aO2 is 95% on room air. She continues to have a cough without sputum production.  Small cell lung cancer/ colon cancer: Pt is followed closely by her oncologist .  GERD: PPI therapy continued.  HTN: Meds reviewed. Pt has been continued on her Hydralazine (changed to 50 mg tid) and metoprolol and clonidine continued.  I have seen and examined this patient myself. I have spent 30 minutes in her evaluation and care.  Emilee Market, DO [facility] Direct contact: see [email or web address]  7PM-7AM contact night coverage as above  08/24/2019, 5:52 PM  LOS: 5 days

## 2019-08-24 NOTE — Consult Note (Addendum)
NAME: Gloria Allen  DOB: 20-Jun-1946  MRN: [id number]  Date/Time: 08/24/2019 7:06 PM  REQUESTING PROVIDER:Dr.pabon Subjective:  REASON FOR CONSULT: fever ? Gloria Allen is a 73 y.o. 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 Intraoperative there was dense adhesions of small bowel to Abdominal wall, parastomal hernia . On 08/20/19 developed rt sided headache and rt arm pain. Was hypotensive, code stroke wa called, seen by neurologist. Head CT no changes, she did not think the patient had CVA.started fever 11/20- Blood culture sent and started on zosyn and then vanco added on 11/22 [facility] 11/20 NG    She does not have central line or foley CT abdomen done on 11/21 Small fluid collection anterior right lower quadrant around the distal ileal anastomosis and cecal tip. Attenuation of the fluid is higher than would be expected for serous fluid and several small gas bubbles are evident within the fluid. There may be some overlying peritoneal enhancement in this region. Postoperative day 4 would be earlier than expected for abscess and features likely reflect some.residual small hematoma/blood products. Extraluminal gas is not unexpected on postoperative day 4. 2. Subcutaneous gas in small subcutaneous fluid collection in the right anterior abdominal wall at the location of ileostomy takedown. This is probably a tiny residual seroma, and again, extraluminal gas is not unexpected 4 days after surgery. 3. No evidence for bowel obstruction or anastomotic leak. The oral contrast has migrated through the ileal anastomosis into the terminal ileum and cecum. Contrast has not yet reached the distal colon although there is no evidence for fluid around the  rectal anastomosis. 4. Edema/stranding in the ileocolic mesentery is presumably sequelae of recent surgery. 5. Trace right lower lobe collapse/consolidation with small right pleural effusion. CT chest showed rt ground glass opacity. I am asked to see her for persistent fever Pt says she I okay, has had a cough for some time- not productive of sputum. Has some soreness at the site of surgery No dysuria No nausea or vomiting Hardware rt ankle after ORIF Past Medical History:  Diagnosis Date  . Anxiety   . Arthritis   . Asthma   . Cervical central spinal stenosis (C3-C7) (worse at C4-5) 07/30/2017  . Cervical foraminal stenosis (C4-5 and C5-6) (Bilateral) (L>R) 07/30/2017  . Chronic lower extremity pain (Fourth Area of Pain) (Bilateral) (R>L) 06/11/2017  . Chronic neck pain (Primary Area of Pain) (Bilateral) (R>L) 06/11/2017  . Chronic sacroiliac joint pain (Bilateral) (R>L) 06/11/2017  . Chronic shoulder pain [facility] Area of Pain) (Right) 06/11/2017  . Chronic upper extremity pain (Fifth Area of Pain) (Bilateral) (R>L) 06/11/2017  . Colon cancer ([place]) 11/2018   Partial colon resection  . DDD (degenerative disc disease), cervical 07/30/2017  . DDD (degenerative disc disease), lumbar 07/30/2017  . Depression   . DISH (diffuse idiopathic skeletal hyperostosis) 07/30/2017  . Dyspnea    with exertion  . Entrapment syndrome 06/20/2017   2002 on R and 2010 on L  . Full thickness rotator cuff tear 06/12/2017  . GERD (gastroesophageal reflux disease)   . Grade 1 Anterolisthesis of L4 over L5 07/30/2017  . Headache   . Hx: UTI (urinary tract infection)   . Hypertension   . Inflammation of joint of shoulder region 01/15/2017  . Lumbar  central spinal stenosis (L4-5) 07/30/2017  . Lumbar disc protrusion (Left: L5-S1) (Right: L1-2) 07/30/2017   L5-S1 left foraminal protrusion with L5 impingement. L1-2 right paracentral protrusion without impingement.  . Lumbar facet arthropathy (Bilateral)  07/30/2017  . Lumbar facet syndrome (Bilateral) (R>L) 07/30/2017  . Lung cancer ([place]) 11/2018   Chemo  and rad tx's  . Osteoarthritis of shoulder (Right) 01/15/2017    Past Surgical History:  Procedure Laterality Date  . ABDOMINAL HYSTERECTOMY    . APPENDECTOMY    . BREAST REDUCTION SURGERY Bilateral   . COLONOSCOPY WITH PROPOFOL N/A 11/05/2018   Procedure: COLONOSCOPY WITH PROPOFOL;  Surgeon: Virgel Manifold, MD;  Location: [facility] ENDOSCOPY;  Service: Endoscopy;  Laterality: N/A;  . COLONOSCOPY WITH PROPOFOL N/A 11/06/2018   Procedure: COLONOSCOPY WITH PROPOFOL;  Surgeon: Virgel Manifold, MD;  Location: [facility] ENDOSCOPY;  Service: Endoscopy;  Laterality: N/A;  . COLONOSCOPY WITH PROPOFOL N/A 07/15/2019   Procedure: COLONOSCOPY WITH PROPOFOL;  Surgeon: Virgel Manifold, MD;  Location: [facility] ENDOSCOPY;  Service: Endoscopy;  Laterality: N/A;  . COLOSTOMY    . DILATION AND CURETTAGE OF UTERUS    . ESOPHAGOGASTRODUODENOSCOPY (EGD) WITH PROPOFOL N/A 11/05/2018   Procedure: ESOPHAGOGASTRODUODENOSCOPY (EGD) WITH PROPOFOL;  Surgeon: Virgel Manifold, MD;  Location: [facility] ENDOSCOPY;  Service: Endoscopy;  Laterality: N/A;  . ESOPHAGOGASTRODUODENOSCOPY (EGD) WITH PROPOFOL N/A 03/27/2019   Procedure: ESOPHAGOGASTRODUODENOSCOPY (EGD) WITH PROPOFOL;  Surgeon: Lucilla Lame, MD;  Location: [facility] ENDOSCOPY;  Service: Endoscopy;  Laterality: N/A;  . ILEOSTOMY Right 11/27/2018   Procedure: ILEOSTOMY;  Surgeon: Jules Husbands, MD;  Location: [facility] ORS;  Service: General;  Laterality: Right;  . ILEOSTOMY CLOSURE N/A 08/18/2019   Procedure: ILEOSTOMY TAKEDOWN;  Surgeon: Jules Husbands, MD;  Location: [facility] ORS;  Service: General;  Laterality: N/A;  . LAPAROSCOPIC SIGMOID COLECTOMY N/A 11/27/2018   Procedure: LAPAROSCOPIC SIGMOID COLECTOMY;  Surgeon: Jules Husbands, MD;  Location: [facility] ORS;  Service: General;  Laterality: N/A;  . ORIF ANKLE FRACTURE Right 11/21/2018   Procedure: OPEN REDUCTION INTERNAL FIXATION  (ORIF) ANKLE FRACTURE;  Surgeon: Corky Mull, MD;  Location: [facility] ORS;  Service: Orthopedics;  Laterality: Right;  . PORT-A-CATH REMOVAL Right 08/18/2019   Procedure: REMOVAL PORT-A-CATH;  Surgeon: Jules Husbands, MD;  Location: [facility] ORS;  Service: General;  Laterality: Right;  . PORTACATH PLACEMENT N/A 03/12/2019   Procedure: INSERTION PORT-A-CATH;  Surgeon: Jules Husbands, MD;  Location: [facility] ORS;  Service: General;  Laterality: N/A;  . REDUCTION MAMMAPLASTY    . SHOULDER ARTHROSCOPY WITH OPEN ROTATOR CUFF REPAIR AND DISTAL CLAVICLE ACROMINECTOMY Right 03/26/2017   Procedure: right shoulder arthroscopy, arthroscopic subacromial decompression, distal clavicle excision, mini open rotator cuff repair;  Surgeon: Thornton Park, MD;  Location: [facility] ORS;  Service: Orthopedics;  Laterality: Right;    Social History   Socioeconomic History  . Marital status: Divorced    Spouse name: Not on file  . Number of children: 3  . Years of education: Not on file  . Highest education level: 11th grade  Occupational History  . Not on file  Social Needs  . Financial resource strain: Somewhat hard  . Food insecurity    Worry: Sometimes true    Inability: Never true  . Transportation needs    Medical: Yes    Non-medical: No  Tobacco Use  . Smoking status: Former Smoker    Packs/day: 1.00    Years: 15.00    Pack years: 15.00    Types: Cigarettes  Quit date: 2010    Years since quitting: 10.9  . Smokeless tobacco: Never Used  . Tobacco comment: quit over 30 years ago   Substance and Sexual Activity  . Alcohol use: No  . Drug use: Yes    Types: Marijuana    Comment: occ  . Sexual activity: Never  Lifestyle  . Physical activity    Days per week: 0 days    Minutes per session: 0 min  . Stress: Not at all  Relationships  . Social Herbalist on phone: Never    Gets together: Never    Attends religious service: More than 4 times per year    Active member of club or  organization: No    Attends meetings of clubs or organizations: Never    Relationship status: Divorced  . Intimate partner violence    Fear of current or ex partner: No    Emotionally abused: No    Physically abused: No    Forced sexual activity: No  Other Topics Concern  . 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 She receives disability. She has 3 children and several grandchildren. She lives alone. She drives. Uses a walker and shower chair at baseline.     Family History  Problem Relation Age of Onset  . Hypertension Mother   . Breast cancer Mother 14  . Breast cancer Sister 13   Allergies  Allergen Reactions  . Ace Inhibitors Swelling  . Angiotensin Receptor Blockers Swelling  . Sulfa Antibiotics Itching  . Chlorthalidone Rash  . Flexeril [Cyclobenzaprine] Rash    ? Current Facility-Administered Medications  Medication Dose Route Frequency Provider Last Rate Last Dose  . 0.9 %  sodium chloride infusion   Intravenous PRN Jules Husbands, MD   Stopped at 08/24/19 1809  . acetaminophen (TYLENOL) tablet 650 mg  650 mg Oral Q6H PRN Lang Snow, NP   650 mg at 08/24/19 1828  . ALPRAZolam Duanne Moron) tablet 0.5 mg  0.5 mg Oral TID PRN Caroleen Hamman F, MD   0.5 mg at 08/24/19 1811  . amLODipine (NORVASC) tablet 10 mg  10 mg Oral QHS Swayze, Ava, DO   10 mg at 08/23/19 2246  . benzonatate (TESSALON) capsule 200 mg  200 mg Oral TID PRN Para Skeans, MD   200 mg at 08/22/19 0935  . feeding supplement (ENSURE ENLIVE) (ENSURE ENLIVE) liquid 237 mL  237 mL Oral BID BM Tylene Fantasia, PA-C   237 mL at 08/21/19 0944  . fluticasone furoate-vilanterol (BREO ELLIPTA) 200-25 MCG/INH 1 puff  1 puff Inhalation Staci Righter, [place] F, MD   1 puff at 08/24/19 [phone]  . heparin injection 5,000 Units  5,000 Units Subcutaneous Q8H Pabon, [place] F, MD   5,000 Units at 08/24/19 1243  . hydrALAZINE (APRESOLINE) injection 10 mg  10 mg Intravenous Q2H PRN  Caroleen Hamman F, MD   10 mg at 08/23/19 0718  . hydrALAZINE (APRESOLINE) tablet 75 mg  75 mg Oral Q6H Swayze, Ava, DO   75 mg at 08/24/19 1808  . hydrOXYzine (ATARAX/VISTARIL) tablet 10 mg  10 mg Oral TID PRN Jules Husbands, MD   10 mg at 08/21/19 2053  . ipratropium-albuterol (DUONEB) 0.5-2.5 (3) MG/3ML nebulizer solution 3 mL  3 mL Nebulization TID Caroleen Hamman F, MD   3 mL at 08/24/19 1439  . ipratropium-albuterol (DUONEB) 0.5-2.5 (3) MG/3ML nebulizer solution 3  mL  3 mL Nebulization Q6H PRN Pabon, Diego F, MD      . metoprolol succinate (TOPROL-XL) 24 hr tablet 100 mg  100 mg Oral Daily Swayze, Ava, DO   100 mg at 08/24/19 1016  . mirtazapine (REMERON) tablet 15 mg  15 mg Oral QHS Para Skeans, MD   15 mg at 08/23/19 2247  . montelukast (SINGULAIR) tablet 10 mg  10 mg Oral QHS Para Skeans, MD   10 mg at 08/23/19 2247  . morphine 2 MG/ML injection 2 mg  2 mg Intravenous Q3H PRN Para Skeans, MD   2 mg at 08/23/19 2033  . OLANZapine (ZYPREXA) tablet 10 mg  10 mg Oral QHS Swayze, Ava, DO      . ondansetron (ZOFRAN-ODT) disintegrating tablet 4 mg  4 mg Oral Q6H PRN Para Skeans, MD   4 mg at 08/23/19 1220   Or  . ondansetron (ZOFRAN) injection 4 mg  4 mg Intravenous Q6H PRN Florina Ou V, MD      . oxyCODONE (Oxy IR/ROXICODONE) immediate release tablet 5-10 mg  5-10 mg Oral Q4H PRN Para Skeans, MD   10 mg at 08/24/19 0807  . pantoprazole (PROTONIX) EC tablet 40 mg  40 mg Oral QHS Para Skeans, MD   40 mg at 08/23/19 2247  . piperacillin-tazobactam (ZOSYN) IVPB 3.375 g  3.375 g Intravenous Q8H Florina Ou V, MD 12.5 mL/hr at 08/24/19 1813    . potassium chloride SA (KLOR-CON) CR tablet 20 mEq  20 mEq Oral Daily Para Skeans, MD   20 mEq at 08/24/19 1601  . pregabalin (LYRICA) capsule 75 mg  75 mg Oral BID Para Skeans, MD   75 mg at 08/24/19 0932  . prochlorperazine (COMPAZINE) tablet 10 mg  10 mg Oral Q6H PRN Para Skeans, MD       Or  . prochlorperazine (COMPAZINE) injection 5-10 mg   5-10 mg Intravenous Q6H PRN Para Skeans, MD      . sertraline (ZOLOFT) tablet 200 mg  200 mg Oral QHS Para Skeans, MD   200 mg at 08/23/19 2248  . terazosin (HYTRIN) capsule 1 mg  1 mg Oral QHS Para Skeans, MD   1 mg at 08/24/19 0104  . vancomycin (VANCOCIN) 1,250 mg in sodium chloride 0.9 % 250 mL IVPB  1,250 mg Intravenous Q24H Rowland Lathe, RPH   Stopped at 08/24/19 1726   Facility-Administered Medications Ordered in Other Encounters  Medication Dose Route Frequency Provider Last Rate Last Dose  . sodium chloride flush (NS) 0.9 % injection 10 mL  10 mL Intravenous PRN Verlon Au, NP   10 mL at 04/13/19 1430     Abtx:  Anti-infectives (From admission, onward)   Start     Dose/Rate Route Frequency Ordered Stop   08/24/19 1600  vancomycin (VANCOCIN) 1,250 mg in sodium chloride 0.9 % 250 mL IVPB     1,250 mg 166.7 mL/hr over 90 Minutes Intravenous Every 24 hours 08/23/19 1446     08/23/19 1530  vancomycin (VANCOCIN) 1,750 mg in sodium chloride 0.9 % 500 mL IVPB     1,750 mg 250 mL/hr over 120 Minutes Intravenous  Once 08/23/19 1441 08/23/19 1727   08/21/19 1800  piperacillin-tazobactam (ZOSYN) IVPB 3.375 g     3.375 g 12.5 mL/hr over 240 Minutes Intravenous Every 8 hours 08/21/19 1749     08/19/19 0600  cefoTEtan (CEFOTAN) 2 g in  sodium chloride 0.9 % 100 mL IVPB     2 g 200 mL/hr over 30 Minutes Intravenous On call to O.R. 08/18/19 1506 08/19/19 0603   08/18/19 0600  cefoTEtan (CEFOTAN) 2 g in sodium chloride 0.9 % 100 mL IVPB     2 g 200 mL/hr over 30 Minutes Intravenous On call to O.R. 08/17/19 2242 08/18/19 1053      REVIEW OF SYSTEMS:  Const:  fever, negative chills, negative weight loss Eyes: negative diplopia or visual changes, negative eye pain ENT: negative coryza, negative sore throat Resp: ++ cough, hemoptysis, dyspnea Cards: negative for chest pain, palpitations, lower extremity edema GU: negative for frequency, dysuria and hematuria GI: as  above, Skin: negative for rash and pruritus Heme: negative for easy bruising and gum/nose bleeding MS: generalized weakness Neurolo:negative for headaches, dizziness, vertigo, memory problems  Psych: negative for feelings of anxiety, depression  Endocrine: negative for thyroid, diabetes Allergy/Immunology-as above Objective:  VITALS:  BP (!) 149/58 (BP Location: Right Arm)   Pulse 92   Temp (!) 102.1 F (38.9 C) (Oral)   Resp 18   Ht 5\' 5"  (1.651 m)   Wt 70.9 kg   SpO2 94%   BMI 26.01 kg/m  PHYSICAL EXAM:  General: Alert, cooperative, no distress,   Head: Normocephalic, without obvious abnormality, atraumatic. Eyes: Conjunctivae clear, anicteric sclerae. Pupils are equal ENT Nares normal. No drainage or sinus tenderness. Lips, mucosa, and tongue normal. No Thrush Neck: Supple, symmetrical, no adenopathy, thyroid: non tender no carotid bruit and no JVD. Back: No CVA tenderness. Lungs: b/l air entry- crepts rt side Port site approximated in surgical glue- no erythema  Heart: Regular rate and rhythm, no murmur, rub or gallop. Abdomen: Soft, at the site of surgery some induration- some duskiness at the surgical site    Extremities: atraumatic, no cyanosis. No edema. No clubbing Skin: No rashes or lesions. Or bruising Lymph: Cervical, supraclavicular normal. Neurologic: Grossly non-focal Pertinent Labs Lab Results CBC       Component Value Date/Time   WBC 11.7 (H) 08/24/2019 0420   RBC 3.11 (L) 08/24/2019 0420   HGB 8.9 (L) 08/24/2019 0420   HGB 12.0 08/21/2018 1443   HCT 26.4 (L) 08/24/2019 0420   HCT 36.5 08/21/2018 1443   PLT 253 08/24/2019 0420   PLT 362 08/21/2018 1443   MCV 84.9 08/24/2019 0420   MCV 81 08/21/2018 1443   MCH 28.6 08/24/2019 0420   MCHC 33.7 08/24/2019 0420   RDW 13.9 08/24/2019 0420   RDW 13.6 08/21/2018 1443   LYMPHSABS 0.8 08/23/2019 0612   LYMPHSABS 2.2 04/22/2018 1559   MONOABS 1.0 08/23/2019 0612   EOSABS 0.2 08/23/2019 0612    EOSABS 0.2 04/22/2018 1559   BASOSABS 0.0 08/23/2019 0612   BASOSABS 0.1 04/22/2018 1559   Hb 13.7>>8.9  CMP Latest Ref Rng & Units 08/24/2019 08/23/2019 08/22/2019  Glucose 70 - 99 mg/dL 140(H) 137(H) 143(H)  BUN 8 - 23 mg/dL 7(L) 8 8  Creatinine 0.44 - 1.00 mg/dL 0.73 0.72 0.64  Sodium 135 - 145 mmol/L 140 138 140  Potassium 3.5 - 5.1 mmol/L 3.5 3.3(L) 3.4(L)  Chloride 98 - 111 mmol/L 105 104 109  CO2 22 - 32 mmol/L 23 21(L) 21(L)  Calcium 8.9 - 10.3 mg/dL 8.6(L) 8.6(L) 8.4(L)  Total Protein 6.5 - 8.1 g/dL - 6.3(L) 6.3(L)  Total Bilirubin 0.3 - 1.2 mg/dL - 0.6 0.6  Alkaline Phos 38 - 126 U/L - 125 107  AST 15 - 41  U/L - 21 23  ALT 0 - 44 U/L - 21 19      Microbiology: Recent Results (from the past 240 hour(s))  CULTURE, BLOOD (ROUTINE X 2) w Reflex to ID Panel     Status: None (Preliminary result)   Collection Time: 08/21/19  5:45 PM   Specimen: BLOOD  Result Value Ref Range Status   Specimen Description BLOOD RT [facility]  Final   Special Requests   Final    BOTTLES DRAWN AEROBIC AND ANAEROBIC Blood Culture adequate volume   Culture   Final    NO GROWTH 3 DAYS Performed at [facility], [address], [place], [place] [address]    Report Status PENDING  Incomplete  CULTURE, BLOOD (ROUTINE X 2) w Reflex to ID Panel     Status: None (Preliminary result)   Collection Time: 08/21/19  5:48 PM   Specimen: BLOOD  Result Value Ref Range Status   Specimen Description BLOOD R HAND  Final   Special Requests   Final    BOTTLES DRAWN AEROBIC AND ANAEROBIC Blood Culture adequate volume   Culture   Final    NO GROWTH 3 DAYS Performed at [facility], [address], [place], [place] [address]    Report Status PENDING  Incomplete  SARS CORONAVIRUS 2 (TAT 6-24 HRS) Nasopharyngeal Nasopharyngeal Swab     Status: None   Collection Time: 08/21/19  6:30 PM   Specimen: Nasopharyngeal Swab  Result Value Ref Range Status   SARS Coronavirus 2 NEGATIVE NEGATIVE Final     Comment: (NOTE) SARS-CoV-2 target nucleic acids are NOT DETECTED. The SARS-CoV-2 RNA is generally detectable in upper and lower respiratory specimens during the acute phase of infection. Negative results do not preclude SARS-CoV-2 infection, do not rule out co-infections with other pathogens, and should not be used as the sole basis for treatment or other patient management decisions. Negative results must be combined with clinical observations, patient history, and epidemiological information. The expected result is Negative. Fact Sheet for Patients: SugarRoll.be Fact Sheet for Healthcare Providers: https://www.woods-mathews.com/ This test is not yet approved or cleared by the [place] FDA and  has been authorized for detection and/or diagnosis of SARS-CoV-2 by FDA under an Emergency Use Authorization (EUA). This EUA will remain  in effect (meaning this test can be used) for the duration of the COVID-19 declaration under Section 56 4(b)(1) of the Act, 21 U.S.C. section 360bbb-3(b)(1), unless the authorization is terminated or revoked sooner. Performed at [facility], [place] [address], [place], [place] [address]   MRSA PCR Screening     Status: None   Collection Time: 08/23/19  3:28 PM   Specimen: Nasal Mucosa; Nasopharyngeal  Result Value Ref Range Status   MRSA by PCR NEGATIVE NEGATIVE Final    Comment:        The GeneXpert MRSA Assay (FDA approved for NASAL specimens only), is one component of a comprehensive MRSA colonization surveillance program. It is not intended to diagnose MRSA infection nor to guide or monitor treatment for MRSA infections. Performed at [facility], [address] [place],  [address]     IMAGING RESULTS: CT scan chest 08/23/19  I have personally reviewed the films Ground-glass airspace opacities and consolidation throughout the right lung field is concerning for multifocal  pneumonia or aspiration in the appropriate clinical setting. ? Impression/Recommendation  Sigmoid colon/rectal  Cancer- s/p lower anterior resection  And loop ileostomy 11/27/18 ? Reversal of ileostomy and  removal of PORT 08/18/19?  New fever since 08/21/19- r/o SSI VS rt sided pneumonia VS non infectious cause like DVT, malignancy WBC normalized, culture neg. On vanco(day3) and zosyn ( day4).  Will get procal, need sputum for culture CT abdomen did not show any intraabdominal collection- watch for abdominal wall collection- may need ot release the sutures?? Cr okay now but will change this combo as risk for AKI- may DC vanco soon She does not have any cardiac device, blood culture neg so at this moment endocarditis not a concern  Rt lung ground glass opacity/infiltrate- concern for aspiraiton /pneumonia- Could it be radiation changes? Will add zithromycin  Anemia  Small cell lung cancer  Rt upper lobe diagnosed as part of workup for ca colon.in May 2020-s/p radiation, carboplatin/etoposide 4 cycles 05/19/19- followed by Dr.Rao ? Discussed with patient,in detail Thanks for asking me to be part of her care team -will follow along Note:  This document was prepared using Dragon voice recognition software and may include unintentional dictation errors.

## 2019-08-25 ENCOUNTER — Encounter: Payer: Self-pay | Admitting: Radiology

## 2019-08-25 ENCOUNTER — Inpatient Hospital Stay: Payer: Medicare Other

## 2019-08-25 DIAGNOSIS — C187 Malignant neoplasm of sigmoid colon: Secondary | ICD-10-CM

## 2019-08-25 DIAGNOSIS — J700 Acute pulmonary manifestations due to radiation: Secondary | ICD-10-CM

## 2019-08-25 DIAGNOSIS — R109 Unspecified abdominal pain: Secondary | ICD-10-CM

## 2019-08-25 DIAGNOSIS — R0602 Shortness of breath: Secondary | ICD-10-CM

## 2019-08-25 DIAGNOSIS — C3490 Malignant neoplasm of unspecified part of unspecified bronchus or lung: Secondary | ICD-10-CM

## 2019-08-25 DIAGNOSIS — R197 Diarrhea, unspecified: Secondary | ICD-10-CM

## 2019-08-25 DIAGNOSIS — R05 Cough: Secondary | ICD-10-CM

## 2019-08-25 DIAGNOSIS — Z9889 Other specified postprocedural states: Secondary | ICD-10-CM

## 2019-08-25 DIAGNOSIS — C7800 Secondary malignant neoplasm of unspecified lung: Secondary | ICD-10-CM

## 2019-08-25 LAB — C DIFFICILE QUICK SCREEN W PCR REFLEX
C Diff antigen: NEGATIVE
C Diff interpretation: NOT DETECTED
C Diff toxin: NEGATIVE

## 2019-08-25 LAB — CBC
HCT: 28.2 % — ABNORMAL LOW (ref 36.0–46.0)
Hemoglobin: 9 g/dL — ABNORMAL LOW (ref 12.0–15.0)
MCH: 28.2 pg (ref 26.0–34.0)
MCHC: 31.9 g/dL (ref 30.0–36.0)
MCV: 88.4 fL (ref 80.0–100.0)
Platelets: 307 10*3/uL (ref 150–400)
RBC: 3.19 MIL/uL — ABNORMAL LOW (ref 3.87–5.11)
RDW: 14.1 % (ref 11.5–15.5)
WBC: 12 10*3/uL — ABNORMAL HIGH (ref 4.0–10.5)
nRBC: 0 % (ref 0.0–0.2)

## 2019-08-25 LAB — URINE CULTURE: Culture: NO GROWTH

## 2019-08-25 LAB — OCCULT BLOOD X 1 CARD TO LAB, STOOL: Fecal Occult Bld: NEGATIVE

## 2019-08-25 MED ORDER — RISAQUAD PO CAPS
1.0000 | ORAL_CAPSULE | Freq: Every day | ORAL | Status: DC
  Administered 2019-08-25 – 2019-08-28 (×4): 1 via ORAL
  Filled 2019-08-25 (×4): qty 1

## 2019-08-25 MED ORDER — AZITHROMYCIN 250 MG PO TABS: 500.0000 mg | ORAL_TABLET | Freq: Every day | ORAL | Status: DC

## 2019-08-25 MED ORDER — IOHEXOL 9 MG/ML PO SOLN
500.0000 mL | Freq: Once | ORAL | Status: DC | PRN
  Administered 2019-08-25: 500 mL via ORAL
  Filled 2019-08-25: qty 500

## 2019-08-25 MED ORDER — IOHEXOL 300 MG/ML  SOLN
100.0000 mL | Freq: Once | INTRAMUSCULAR | Status: AC | PRN
  Administered 2019-08-25: 100 mL via INTRAVENOUS

## 2019-08-25 MED ORDER — VANCOMYCIN 50 MG/ML ORAL SOLUTION
125.0000 mg | Freq: Four times a day (QID) | ORAL | Status: DC
  Filled 2019-08-25 (×2): qty 2.5

## 2019-08-25 MED ORDER — AZITHROMYCIN 250 MG PO TABS
500.0000 mg | ORAL_TABLET | Freq: Every day | ORAL | Status: DC
  Administered 2019-08-25 – 2019-08-26 (×2): 500 mg via ORAL
  Filled 2019-08-25 (×2): qty 2

## 2019-08-25 NOTE — Progress Notes (Signed)
OT Cancellation Note  Patient Details Name: Gloria Allen MRN: [id number] DOB: 1946/09/07   Cancelled Treatment:    Reason Eval/Treat Not Completed: Other (comment). Pt noted with fevers today. Will hold OT tx and re-attempt at later date/time as pt is more appropriate.   Jeni Salles, MPH, MS, OTR/L ascom [phone] 08/25/19, 2:44 PM

## 2019-08-25 NOTE — Progress Notes (Signed)
Gloria Allen Hospital Day(s): 7.   Post op day(s): 7 Days Post-Op.   Interval History:  Patient seen and examined no acute events or new complaints overnight.  Patient reports she is just overall feeling weak.  Abdominal soreness at incision, improving No nausea, emesis, cough, congestion T-Max; 102.9 overnight at 0300 On Azithromycin PO, zosyn, and Vancomycin CBC pending On regular diet, + flatus, + BM Continues to mobilize with PT   Vital signs in last 24 hours: [min-max] current  Temp:  [99.2 F (37.3 C)-102.9 F (39.4 C)] 100 F (37.8 C) (11/24 0601) Pulse Rate:  [92-99] 97 (11/24 0601) Resp:  [18-20] 20 (11/24 0601) BP: (149-178)/(51-63) 178/63 (11/24 0601) SpO2:  [92 %-94 %] 94 % (11/24 0601)     Height: 5\' 5"  (165.1 cm) Weight: 70.9 kg BMI (Calculated): 26.01   Intake/Output last 2 shifts:  11/23 0701 - 11/24 0700 In: 641.9 [P.O.:120; I.V.:0.3; IV Piggyback:521.6] Out: 0    Physical Exam:  Constitutional: alert, cooperative and no distress  Respiratory: breathing non-labored at rest, on [place] Cardiovascular: regular rate and sinus rhythm  Gastrointestinal: soft,incisional tenderness (improved), and non-distended.No rebound or guarding Integumentary:Previous ileostomy incision site is CDI with staples, no erythema, no drainage, no fluctuance  Labs:  CBC Latest Ref Rng & Units 08/24/2019 08/23/2019 08/22/2019  WBC 4.0 - 10.5 K/uL 11.7(H) 12.7(H) 13.7(H)  Hemoglobin 12.0 - 15.0 g/dL 8.9(L) 9.3(L) 9.8(L)  Hematocrit 36.0 - 46.0 % 26.4(L) 28.5(L) 29.9(L)  Platelets 150 - 400 K/uL 253 227 218   CMP Latest Ref Rng & Units 08/24/2019 08/23/2019 08/22/2019  Glucose 70 - 99 mg/dL 140(H) 137(H) 143(H)  BUN 8 - 23 mg/dL 7(L) 8 8  Creatinine 0.44 - 1.00 mg/dL 0.73 0.72 0.64  Sodium 135 - 145 mmol/L 140 138 140  Potassium 3.5 - 5.1 mmol/L 3.5 3.3(L) 3.4(L)  Chloride 98 - 111 mmol/L 105 104 109  CO2 22 - 32 mmol/L 23 21(L) 21(L)   Calcium 8.9 - 10.3 mg/dL 8.6(L) 8.6(L) 8.4(L)  Total Protein 6.5 - 8.1 g/dL - 6.3(L) 6.3(L)  Total Bilirubin 0.3 - 1.2 mg/dL - 0.6 0.6  Alkaline Phos 38 - 126 U/L - 125 107  AST 15 - 41 U/L - 21 23  ALT 0 - 44 U/L - 21 19     Imaging studies: No new pertinent imaging studies   Assessment/Plan:  73 y.o. female still with intermittent post-surgical fevers otherwise doing well, tolerating diet, and having bowel fucntion 7 Days Post-Op s/p ileostomy reversal following an LAR for rectosigmoid carcinoma in February of this year   - Continue regular diet   - Continue IV + PO Abx (Zosyn, Vancomycin, Azithromycin PO); ID following  - pain control prn; antiemetics prn - Monitor abdominal examination; on-going bowel function  - mobilization encouraged; PT following - recommending HHPT - medical management of comorbidconditions; resume HH at discharge - DVT prophylaxis  All of the above findings and recommendations were discussed with the patient, and the medical team, and all of patient's questions were answered to her expressed satisfaction.  -- Edison Simon, PA-C [facility] 08/25/2019, 7:25 AM [phone] M-F: 7am - 4pm

## 2019-08-25 NOTE — Progress Notes (Signed)
Physical Therapy Treatment Patient Details Name: Gloria Allen MRN: [id number] DOB: 06-30-1946 Today's Date: 08/25/2019    History of Present Illness Gloria Allen is a 37yoF who comes to [facility] on 11/17 for removal of Right IJ portacath, ileostomy takedown, and repair of parastomal hernia. Code stroke called on 11/19 after acute onset slurring of speech, changes to fine motor coordination, hypotension, and HA. Stat head CT negative, neurology not suspecting any ischaemic process.    PT Comments    Pt presented with min deficits in strength, gait, balance, and activity tolerance but continued to make good progress towards goals.  Pt was steady with transfers and was able to amb 200' without an AD with slow cadence and short B step length but with good stability.  Pt reported moderate fatigue from the effort with SpO2 90-91% after amb quickly increasing to 94-95% once returning to sitting.  Pt demonstrated good eccentric and concentric control ascending and descending steps with one UE support.  Pt will benefit from HHPT services upon discharge to safely address above deficits for decreased caregiver assistance and eventual return to PLOF.     Follow Up Recommendations  Home health PT     Equipment Recommendations  None recommended by PT    Recommendations for Other Services       Precautions / Restrictions Precautions Precautions: Fall Restrictions Weight Bearing Restrictions: No    Mobility  Bed Mobility Overal bed mobility: Independent             General bed mobility comments: Good speed and effort during sup to sit without the need of the bed rail  Transfers Overall transfer level: Modified independent Equipment used: None             General transfer comment: Good control and stability during sit to/from stand transfers from various height surfaces  Ambulation/Gait Ambulation/Gait assistance: Supervision Gait Distance (Feet): 200 Feet Assistive device:  None Gait Pattern/deviations: Step-through pattern;Decreased step length - right;Decreased step length - left Gait velocity: decreased   General Gait Details: Slow cadence but steady with amb without LOB   Stairs Stairs: Yes Stairs assistance: Min guard Stair Management: One rail Right Number of Stairs: 1 General stair comments: Ascend/descend portable step in the hallway with rail for support x 3 with good eccentric and concentric control   Wheelchair Mobility    Modified Rankin (Stroke Patients Only)       Balance Overall balance assessment: Needs assistance   Sitting balance-Leahy Scale: Normal     Standing balance support: No upper extremity supported;During functional activity Standing balance-Leahy Scale: Good                              Cognition Arousal/Alertness: Awake/alert Behavior During Therapy: WFL for tasks assessed/performed Overall Cognitive Status: Within Functional Limits for tasks assessed                                        Exercises Total Joint Exercises Ankle Circles/Pumps: Strengthening;Both;10 reps Quad Sets: Strengthening;Both;10 reps Gluteal Sets: Strengthening;Both;10 reps Heel Slides: AROM;Both;10 reps Hip ABduction/ADduction: Strengthening;Both;10 reps Long Arc Quad: Strengthening;Both;10 reps;15 reps Knee Flexion: Strengthening;Both;10 reps;15 reps Bridges: Strengthening;Both;10 reps    General Comments        Pertinent Vitals/Pain Pain Assessment: No/denies pain    Home Living  Prior Function            PT Goals (current goals can now be found in the care plan section) Progress towards PT goals: Progressing toward goals    Frequency    Min 2X/week      PT Plan Current plan remains appropriate    Co-evaluation              AM-PAC PT "6 Clicks" Mobility   Outcome Measure  Help needed turning from your back to your side while in a flat bed  without using bedrails?: None Help needed moving from lying on your back to sitting on the side of a flat bed without using bedrails?: None Help needed moving to and from a bed to a chair (including a wheelchair)?: None Help needed standing up from a chair using your arms (e.g., wheelchair or bedside chair)?: None Help needed to walk in hospital room?: A Little Help needed climbing 3-5 steps with a railing? : A Little 6 Click Score: 22    End of Session   Activity Tolerance: Patient tolerated treatment well Patient left: in bed;with call bell/phone within reach(pt declined up in chair having been sitting up most of the day) Nurse Communication: Mobility status PT Visit Diagnosis: Unsteadiness on feet (R26.81);Other abnormalities of gait and mobility (R26.89);Muscle weakness (generalized) (M62.81)     Time: 3893-7342 PT Time Calculation (min) (ACUTE ONLY): 24 min  Charges:  $Gait Training: 8-22 mins $Therapeutic Exercise: 8-22 mins                     D. Scott Nyelli Samara PT, DPT 08/25/19, 4:32 PM

## 2019-08-25 NOTE — Consult Note (Signed)
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    Patient was seen by Dr. Gretchen Short underwent hemicolectomy with lymph node sampling on 11/27/2018. Final pathology showed invasive colorectal carcinoma 3.5 cm, grade 2. Tumor invades through muscularis propria into the peri-colorectal tissue. Margins negative. Tumor deposits present, 2. 1 out of 9 lymph nodes examined was positive for malignancy. PT3PN1A.Patient was feeling poorly after surgery and she had no social support and decided against adjuvant chemotherapy.  She has a past medical history is significant for hypertension, colon cancer, chronic pain, DDD of cervical and lumbar vertebral bodies, small cell lung CA depression, entrapment syndrome, GERD, headaches, chronic itching and UTI.   Patient underwent CT chest 12/2018 to complete her staging work-up which showed 2 lung nodules one in the right upper lobe and one in the left left upper lobe.This was followed by a PET CT scan 01/2019 which showed with the left upper lobe nodule was not hypermetabolic.The right upper lobe lung nodule measure 1.6 cm with an SUV of 6.8. No hypermetabolic axillary mediastinal or hilar lymphadenopathy or evidence of metastatic disease elsewhere.  Patient underwent CT-guided biopsy of the right upper lobe lung nodule which was consistent with small cell lung cancerpositive for CD56 and TTF-1. Patient started concurrent chemoradiation in June 2020. Carboplatin chosen instead of cisplatin with etoposide  given her age, poor social support and concern for renal dysfunction. Patient completed 4 cycles of carboplatin and etoposide on 05/19/2019  Abdominal film performed on 08/21/2019 has demonstrated new ground-glass opacity and consolidation in the right perihilar lung concerning for pneumonia. There is also a small focus of opacity in the left peripheral mid lung thought to correspond to a pulmonary nodule noted on prior CT. She has also been febrile and dyspneic with a non-productive cough. She had been  receiving IV Zosyn and breathing treatments.   Patient does NOT seem to have signs and symptoms of aspiration She has no acute resp issues She has mild cough and intermittent wheezing  CT findings relayed to patient. I discussed possible implementation of steroids and she ok with this.    PAST MEDICAL HISTORY :   has a past medical history of Anxiety, Arthritis, Asthma, Cervical central spinal stenosis (C3-C7) (worse at C4-5) (07/30/2017), Cervical foraminal stenosis (C4-5 and C5-6) (Bilateral) (L>R) (07/30/2017), Chronic lower extremity pain (Fourth Area of Pain) (Bilateral) (R>L) (06/11/2017), Chronic neck pain (Primary Area of Pain) (Bilateral) (R>L) (06/11/2017), Chronic sacroiliac joint pain (Bilateral) (R>L) (06/11/2017), Chronic shoulder pain (Tertiary Area of Pain) (Right) (06/11/2017), Chronic upper extremity pain (Fifth Area of Pain) (Bilateral) (R>L) (06/11/2017), Colon cancer ([place]) (11/2018), DDD (degenerative disc disease), cervical (07/30/2017), DDD (degenerative disc disease), lumbar (07/30/2017), Depression, DISH (diffuse idiopathic skeletal hyperostosis) (07/30/2017), Dyspnea, Entrapment syndrome (06/20/2017), Full thickness rotator cuff tear (06/12/2017), GERD (gastroesophageal reflux disease), Grade 1 Anterolisthesis of L4 over L5 (07/30/2017), Headache, UTI (urinary tract infection), Hypertension, Inflammation of joint of shoulder region (01/15/2017), Lumbar central spinal stenosis (L4-5)  (07/30/2017), Lumbar disc protrusion (Left: L5-S1) (Right: L1-2) (07/30/2017), Lumbar facet arthropathy (Bilateral) (07/30/2017),  Lumbar facet syndrome (Bilateral) (R>L) (07/30/2017), Lung cancer ([place]) (11/2018), and Osteoarthritis of shoulder (Right) (01/15/2017).  has a past surgical history that includes Abdominal hysterectomy; Appendectomy; Breast reduction surgery (Bilateral); Dilation and curettage of uterus; Shoulder arthroscopy with open rotator cuff repair and distal clavicle acrominectomy (Right, 03/26/2017); Reduction mammaplasty; Colonoscopy with propofol (N/A, 11/05/2018); Esophagogastroduodenoscopy (egd) with propofol (N/A, 11/05/2018); Colonoscopy with propofol (N/A, 11/06/2018); ORIF ankle fracture (Right, 11/21/2018); Laparoscopic sigmoid colectomy (N/A, 11/27/2018); Ileostomy (Right, 11/27/2018); Colostomy; Portacath placement (N/A, 03/12/2019); Esophagogastroduodenoscopy (egd) with propofol (N/A, 03/27/2019); Colonoscopy with propofol (N/A, 07/15/2019); Ileostomy closure (N/A, 08/18/2019); and Port-a-cath removal (Right, 08/18/2019). Prior to Admission medications   Medication Sig Start Date End Date Taking? Authorizing Provider  albuterol (VENTOLIN HFA) 108 (90 Base) MCG/ACT inhaler Inhale 2 puffs into the lungs every 6 (six) hours as needed for wheezing or shortness of breath. 03/27/19  Yes Wieting, Richard, MD  amLODipine (NORVASC) 5 MG tablet Take 5 mg by mouth daily with lunch.    Yes [provider]  benzonatate (TESSALON) 200 MG capsule Take 200 mg by mouth 3 (three) times daily as needed for cough.   Yes [provider]  BREO ELLIPTA 200-25 MCG/INH AEPB Inhale 1 puff into the lungs daily. Patient taking differently: Inhale 1 puff into the lungs every morning.  03/27/19  Yes Wieting, Richard, MD  cloNIDine (CATAPRES) 0.2 MG tablet Take 0.2 mg by mouth 2 (two) times daily.   Yes [provider]  hydrALAZINE (APRESOLINE) 100 MG tablet Take 1 tablet (100 mg total) by  mouth 3 (three) times daily. 03/27/19  Yes Wieting, Richard, MD  hydrOXYzine (ATARAX/VISTARIL) 10 MG tablet Take 1 tablet (10 mg total) by mouth 3 (three) times daily as needed for itching. 03/27/19  Yes Wieting, Richard, MD  loratadine (CLARITIN) 10 MG tablet Take 1 tablet (10 mg total) by mouth daily. Patient taking differently: Take 10 mg by mouth every morning.  07/22/19  Yes Pabon, Diego F, MD  lovastatin (MEVACOR) 40 MG tablet Take 1 tablet (40 mg total) by mouth at bedtime. 03/27/19  Yes Wieting, Richard, MD  meclizine (ANTIVERT) 12.5 MG tablet Take 1 tablet (12.5 mg total) by mouth 3 (three) times daily as needed for dizziness. 03/27/19  Yes Wieting, Richard, MD  metoprolol succinate (TOPROL-XL) 50 MG 24 hr tablet Take 50 mg by mouth daily with lunch. Take with or immediately following a meal.    Yes [provider]  mirtazapine (REMERON) 15 MG tablet Take 15 mg by mouth at bedtime.   Yes [provider]  montelukast (SINGULAIR) 10 MG tablet Take 1 tablet (10 mg total) by mouth at bedtime as needed. Patient taking differently: Take 10 mg by mouth at bedtime.  03/27/19  Yes Wieting, Richard, MD  omeprazole (PRILOSEC) 40 MG capsule Take 1 capsule (40 mg total) by mouth 2 (two) times daily. Patient taking differently: Take 40 mg by mouth 2 (two) times daily as needed (acid reflux/indigestion.).  03/27/19  Yes Wieting, Richard, MD  potassium chloride SA (K-DUR) 20 MEQ tablet Take 1 tablet (20 mEq total) by mouth 2 (two) times daily. Patient taking differently: Take 20 mEq by mouth daily.  06/25/19  Yes Sindy Guadeloupe, MD  pregabalin (LYRICA) 50 MG capsule Take 1 capsule (50 mg total) by mouth at bedtime for 30 days, THEN 1 capsule (50 mg total) 2 (two) times daily. 07/29/19 10/27/19 Yes Gillis Santa, MD  sertraline (ZOLOFT) 100 MG tablet Take 2 tablets (200 mg total) by mouth at  bedtime. 03/27/19  Yes Wieting, Richard, MD  terazosin (HYTRIN) 1 MG capsule Take 1 capsule (1 mg total) by  mouth at bedtime. 03/27/19  Yes Wieting, Richard, MD  tiZANidine (ZANAFLEX) 4 MG tablet Take 1 tablet (4 mg total) by mouth 2 (two) times daily as needed for muscle spasms. Patient taking differently: Take 4 mg by mouth 2 (two) times daily.  07/29/19 08/28/19 Yes Gillis Santa, MD  amLODipine (NORVASC) 10 MG tablet Take 1 tablet (10 mg total) by mouth daily. Patient not taking: Reported on 08/05/2019 03/27/19   Loletha Grayer, MD  lidocaine-prilocaine (EMLA) cream Apply 1 application topically as directed. Place small amt over port site 1 hour before coming in cancer center for port flush Patient not taking: Reported on 08/05/2019 07/09/19   Sindy Guadeloupe, MD  loperamide (IMODIUM) 2 MG capsule Take 2 capsules (4 mg total) by mouth 3 (three) times daily. Patient not taking: Reported on 08/05/2019 05/27/19   Caroleen Hamman F, MD  metoprolol succinate (TOPROL-XL) 100 MG 24 hr tablet Take 1 tablet (100 mg total) by mouth daily. Take with or immediately following a meal. Patient not taking: Reported on 08/05/2019 03/27/19   Loletha Grayer, MD  OLANZapine (ZYPREXA) 10 MG tablet Take 1 tablet (10 mg total) by mouth at bedtime. Patient not taking: Reported on 07/29/2019 07/09/19   Sindy Guadeloupe, MD  ondansetron (ZOFRAN) 8 MG tablet Take 1 tablet (8 mg total) by mouth every 8 (eight) hours as needed for nausea. Start on day 3 after carboplatin chemo. Patient not taking: Reported on 08/05/2019 07/09/19   Sindy Guadeloupe, MD   Allergies  Allergen Reactions  . Ace Inhibitors Swelling  . Angiotensin Receptor Blockers Swelling  . Sulfa Antibiotics Itching  . Chlorthalidone Rash  . Flexeril [Cyclobenzaprine] Rash    FAMILY HISTORY:  family history includes Breast cancer (age of onset: 25) in her mother; Breast cancer (age of onset: 53) in her sister; Hypertension in her mother. SOCIAL HISTORY:  reports that she quit smoking about 10 years ago. Her smoking use included cigarettes. She has a 15.00 pack-year  smoking history. She has never used smokeless tobacco. She reports current drug use. Drug: Marijuana. She reports that she does not drink alcohol.    Review of Systems:  Gen:  Denies  fever, sweats, chills weigh loss  HEENT: Denies blurred vision, double vision, ear pain, eye pain, hearing loss, nose bleeds, sore throat Cardiac:  No dizziness, chest pain or heaviness, chest tightness,edema, No JVD Resp:   No cough, -sputum production, -shortness of breath,-wheezing, -hemoptysis,  Gi: Denies swallowing difficulty, stomach pain, nausea or vomiting, diarrhea, constipation, bowel incontinence Gu:  Denies bladder incontinence, burning urine Ext:   Denies Joint pain, stiffness or swelling Skin: Denies  skin rash, easy bruising or bleeding or hives Endoc:  Denies polyuria, polydipsia , polyphagia or weight change Psych:   Denies depression, insomnia or hallucinations  Other:  All other systems negative     VITAL SIGNS: Temp:  [99.1 F (37.3 C)-102.9 F (39.4 C)] 99.1 F (37.3 C) (11/24 1511) Pulse Rate:  [91-99] 91 (11/24 1357) Resp:  [18-22] 22 (11/24 1357) BP: (141-178)/(51-63) 141/54 (11/24 1357) SpO2:  [92 %-95 %] 95 % (11/24 1357)     SpO2: 95 % O2 Flow Rate (L/min): 1 L/min    Physical Examination:   GENERAL:NAD, no fevers, chills, no weakness no fatigue HEAD: Normocephalic, atraumatic.  EYES: PERLA, EOMI No scleral icterus.  MOUTH: Moist mucosal membrane.  EAR,  NOSE, THROAT: Clear without exudates. No external lesions.  NECK: Supple.  PULMONARY: CTA B/L no wheezing, rhonchi, crackles CARDIOVASCULAR: S1 and S2. Regular rate and rhythm. No murmurs GASTROINTESTINAL: Soft, nontender, nondistended. Positive bowel sounds.  MUSCULOSKELETAL: No swelling, clubbing, or edema.  NEUROLOGIC: No gross focal neurological deficits. 5/5 strength all extremities SKIN: No ulceration, lesions, rashes, or cyanosis.  PSYCHIATRIC: Insight, judgment intact. -depression -anxiety ALL OTHER  ROS ARE NEGATIVE   MEDICATIONS: I have reviewed all medications and confirmed regimen as documented    CULTURE RESULTS   Recent Results (from the past 240 hour(s))  CULTURE, BLOOD (ROUTINE X 2) w Reflex to ID Panel     Status: None (Preliminary result)   Collection Time: 08/21/19  5:45 PM   Specimen: BLOOD  Result Value Ref Range Status   Specimen Description BLOOD RT [facility]  Final   Special Requests   Final    BOTTLES DRAWN AEROBIC AND ANAEROBIC Blood Culture adequate volume   Culture   Final    NO GROWTH 3 DAYS Performed at [facility], [address], [place], [place] [address]    Report Status PENDING  Incomplete  CULTURE, BLOOD (ROUTINE X 2) w Reflex to ID Panel     Status: None (Preliminary result)   Collection Time: 08/21/19  5:48 PM   Specimen: BLOOD  Result Value Ref Range Status   Specimen Description BLOOD R HAND  Final   Special Requests   Final    BOTTLES DRAWN AEROBIC AND ANAEROBIC Blood Culture adequate volume   Culture   Final    NO GROWTH 3 DAYS Performed at [facility], [address], [place], [place] [address]    Report Status PENDING  Incomplete  SARS CORONAVIRUS 2 (TAT 6-24 HRS) Nasopharyngeal Nasopharyngeal Swab     Status: None   Collection Time: 08/21/19  6:30 PM   Specimen: Nasopharyngeal Swab  Result Value Ref Range Status   SARS Coronavirus 2 NEGATIVE NEGATIVE Final    Comment: (NOTE) SARS-CoV-2 target nucleic acids are NOT DETECTED. The SARS-CoV-2 RNA is generally detectable in upper and lower respiratory specimens during the acute phase of infection. Negative results do not preclude SARS-CoV-2 infection, do not rule out co-infections with other pathogens, and should not be used as the sole basis for treatment or other patient management decisions. Negative results must be combined with clinical observations, patient history, and epidemiological information. The expected result is Negative. Fact Sheet for  Patients: SugarRoll.be Fact Sheet for Healthcare Providers: https://www.woods-mathews.com/ This test is not yet approved or cleared by the [place] FDA and  has been authorized for detection and/or diagnosis of SARS-CoV-2 by FDA under an Emergency Use Authorization (EUA). This EUA will remain  in effect (meaning this test can be used) for the duration of the COVID-19 declaration under Section 56 4(b)(1) of the Act, 21 U.S.C. section 360bbb-3(b)(1), unless the authorization is terminated or revoked sooner. Performed at [facility], [place] [address] [place], [place] [address]   MRSA PCR Screening     Status: None   Collection Time: 08/23/19  3:28 PM   Specimen: Nasal Mucosa; Nasopharyngeal  Result Value Ref Range Status   MRSA by PCR NEGATIVE NEGATIVE Final    Comment:        The GeneXpert MRSA Assay (FDA approved for NASAL specimens only), is one component of a comprehensive MRSA colonization surveillance program. It is not intended to diagnose MRSA infection nor to guide or monitor treatment for  MRSA infections. Performed at [facility], [address] [place], [place] [address]   Culture, Urine     Status: None   Collection Time: 08/24/19  6:42 AM   Specimen: Urine, Random  Result Value Ref Range Status   Specimen Description   Final    URINE, RANDOM Performed at [facility], [address] [place], [place] [address]    Special Requests   Final    NONE Performed at [facility], [address] [name], [place] [address]    Culture   Final    NO GROWTH Performed at [facility], [place] [address] [place], [place] [address]    Report Status 08/25/2019 FINAL  Final          IMAGING    Dg Chest 1 View  Result Date: 08/25/2019 CLINICAL DATA:  Pneumonia. EXAM: CHEST  1 VIEW COMPARISON:  CTA of the chest 08/23/2019 FINDINGS: Heart is enlarged. There is no edema. Asymmetric  right-sided airspace disease is again noted. Left lung is clear. Small right pleural effusion is again noted. IMPRESSION: 1. Stable right-sided airspace disease compatible with pneumonia. 2. Stable small right pleural effusion. 3. Stable cardiomegaly without failure. Electronically Signed   By: San Morelle M.D.   On: 08/25/2019 11:20       ASSESSMENT AND PLAN SYNOPSIS  73 year old white female with a history of colon cancer status post resection with postop takedown ileostomy presenting with shortness of breath with fevers on 08/22/2019 with CT chest consistent with right-sided multifocal areas of disease Patient has a history of right upper lobe lung cancer status post radiation therapy  At this time it is a possibility she  radiation pneumonitis and less likley aspiration I would recommend prednisone therapy and assess response.    Corrin Parker, M.D.  [facility]  Medical Director [place] Director [facility] Cardio-Pulmonary Department

## 2019-08-25 NOTE — Progress Notes (Signed)
Date of Admission:  08/18/2019    11/17 Ileostomy reversal Received cefotetan as surgical prophylaxis  11/20 fever 11/20 started zosyn 11/22 Vanco 11/21 CT abdomen 11/22 CT chest   Subjective: Having fever Having diarrhea - had 3 loose stools in 24 hrs Abdominal pain Cough persist  Medications:  . acidophilus  1 capsule Oral Daily  . amLODipine  10 mg Oral QHS  . azithromycin  500 mg Oral Daily  . feeding supplement (ENSURE ENLIVE)  237 mL Oral BID BM  . fluticasone furoate-vilanterol  1 puff Inhalation BH-q7a  . heparin  5,000 Units Subcutaneous Q8H  . hydrALAZINE  75 mg Oral Q6H  . metoprolol succinate  100 mg Oral Daily  . mirtazapine  15 mg Oral QHS  . montelukast  10 mg Oral QHS  . OLANZapine  10 mg Oral QHS  . pantoprazole  40 mg Oral QHS  . potassium chloride SA  20 mEq Oral Daily  . pregabalin  75 mg Oral BID  . sertraline  200 mg Oral QHS  . terazosin  1 mg Oral QHS    Objective: Vital signs in last 24 hours: Temp:  [99.1 F (37.3 C)-102.9 F (39.4 C)] 99.1 F (37.3 C) (11/24 1511) Pulse Rate:  [91-99] 91 (11/24 1357) Resp:  [18-22] 22 (11/24 1357) BP: (141-178)/(51-63) 141/54 (11/24 1357) SpO2:  [92 %-95 %] 95 % (11/24 1357)  PHYSICAL EXAM:  General: Alert, cooperative, no distress, ambulating to the bathroom Back: No CVA tenderness. Lungs: b/l air entry Heart: Regular rate and rhythm, no murmur, rub or gallop. Abdomen: some  tenderness  Surgical site some duskiness , induration   Stool looks coffee ground in color- FOB negative Extremities: atraumatic, no cyanosis. No edema. No clubbing Skin: No rashes or lesions. Or bruising Lymph: Cervical, supraclavicular normal. Neurologic: Grossly non-focal  Lab Results Recent Labs    08/23/19 0612 08/24/19 0420 08/25/19 0743  WBC 12.7* 11.7* 12.0*  HGB 9.3* 8.9* 9.0*  HCT 28.5* 26.4* 28.2*  NA 138 140  --   K 3.3* 3.5  --   CL 104 105  --   CO2 21* 23  --   BUN 8 7*  --   CREATININE 0.72  0.73  --    Liver Panel Recent Labs    08/23/19 0612  PROT 6.3*  ALBUMIN 2.7*  AST 21  ALT 21  ALKPHOS 125  BILITOT 0.6   Sedimentation Rate No results for input(s): ESRSEDRATE in the last 72 hours. C-Reactive Protein No results for input(s): CRP in the last 72 hours.  Microbiology:  Studies/Results: Dg Chest 1 View  Result Date: 08/25/2019 CLINICAL DATA:  Pneumonia. EXAM: CHEST  1 VIEW COMPARISON:  CTA of the chest 08/23/2019 FINDINGS: Heart is enlarged. There is no edema. Asymmetric right-sided airspace disease is again noted. Left lung is clear. Small right pleural effusion is again noted. IMPRESSION: 1. Stable right-sided airspace disease compatible with pneumonia. 2. Stable small right pleural effusion. 3. Stable cardiomegaly without failure. Electronically Signed   By: San Morelle M.D.   On: 08/25/2019 11:20   Ct Angio Chest Pe W Or Wo Contrast  Result Date: 08/23/2019 CLINICAL DATA:  Shortness of breath EXAM: CT ANGIOGRAPHY CHEST WITH CONTRAST TECHNIQUE: Multidetector CT imaging of the chest was performed using the standard protocol during bolus administration of intravenous contrast. Multiplanar CT image reconstructions and MIPs were obtained to evaluate the vascular anatomy. CONTRAST:  80mL OMNIPAQUE IOHEXOL 350 MG/ML SOLN COMPARISON:  06/05/2019 FINDINGS: Cardiovascular:  Contrast injection is sufficient to demonstrate satisfactory opacification of the pulmonary arteries to the segmental level. There is no pulmonary embolus. The main pulmonary artery is dilated measuring approximately 3.5 cm. There is no CT evidence of acute right heart strain. The visualized aorta is normal. Heart size is normal, without pericardial effusion. Mediastinum/Nodes: --No mediastinal or hilar lymphadenopathy. --No axillary lymphadenopathy. --No supraclavicular lymphadenopathy. --Normal thyroid gland. --The esophagus is unremarkable Lungs/Pleura: There confluent ground-glass airspace  opacities within the right upper lobe and to a lesser extent right middle and right lower lobes. There is fluid along the major and minor fissures. There is some smooth interlobular septal thickening involving the right upper lobe. There are a few peripheral subcentimeter pulmonary nodules in the right upper lobe. There is a relatively stable pulmonary opacity in the left upper lobe measuring approximately 1.8 cm (previously measuring the same). There is a small right-sided pleural effusion with adjacent atelectasis. Upper Abdomen: No acute abnormality. Musculoskeletal: No chest wall abnormality. No acute or significant osseous findings. There is some subcutaneous fat stranding involving the right chest wall. The this is favored to represent sequela of a recently removed right-sided Port-A-Cath. Review of the MIP images confirms the above findings. IMPRESSION: 1. Ground-glass airspace opacities and consolidation throughout the right lung field is concerning for multifocal pneumonia or aspiration in the appropriate clinical setting. 2. Small right-sided parapneumonic effusion. 3. Stable pulmonary opacity in the left upper lobe currently measuring 1.8 cm. 4. No acute pulmonary embolism. The main pulmonary artery is dilated which can be seen in patients with elevated pulmonary artery pressures. Electronically Signed   By: Constance Holster M.D.   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  Differential diagnosis SSI versus right-sided pneumonia versus noninfectious cause like DVT Patient has had 3 unformed stool in the last 24 hours, some abdominal pain.  The stool looks coffee-ground in color r/o GI bleed.  Will check cdiff if FOB negative Also concerning is the surgical site which is got induration around but as per surgeon he had  removed 1 staple and probed with Q-tip.  No discharge other than old blood  Recommend repeat CT scan abdomen to see whether there is any abdominal wall collection  Right lung has groundglass opacity/infiltrate.  Initially there was concern for aspiration pneumonia.  She has had radiation to the right lung recently. Recommend pulmonary consult  Anemia  Small cell lung cancer- S/P radiation and 4 cycles of chemo 05/19/19  Discussed the management with the care team  P.S stool occult blood neg Cdiff negative CT scan done result pending

## 2019-08-25 NOTE — Progress Notes (Signed)
PROGRESS NOTE  Gloria Allen GMW:[id number] DOB: 01/14/1946 DOA: 08/18/2019 PCP: [facility]  Brief History   The patient is a 73 yr old woman who is 5 days s/p surgery for ileostomy reversal following a low- anterior resection and loop ileostomy for rectosigmoid carcinoma in February of this year. The hospitalist service was consulted out of concern for possible aspiration pneumonia resulting in fevers and dyspnea by the patient on 08/22/2019.  She has a past medical history is significant for hypertension, colon cancer, chronic pain, DDD of cervical and lumbar vertebral bodies, small cell lung CA depression, entrapment syndrome, GERD, headaches, chronic itching and UTI.  Abdominal film performed on 08/21/2019 has demonstrated new ground-glass opacity and consolidation in the right perihilar lung concerning for pneumonia. There is also a small focus of opacity in the left peripheral mid lung thought to correspond to a pulmonary nodule noted on prior CT. She has also been febrile and dyspneic with a non-productive cough. She is receiving IV Zosyn and breathing treatments.  Consultants  . 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  Antibiotics   Anti-infectives (From admission, onward)   Start     Dose/Rate Route Frequency Ordered Stop   08/25/19 1000  azithromycin (ZITHROMAX) tablet 500 mg  Status:  Discontinued     500 mg Oral Daily 08/25/19 0011 08/25/19 0012   08/25/19 0600  azithromycin (ZITHROMAX) tablet 500 mg     500 mg Oral Daily 08/25/19 0012 08/30/19 0959   08/24/19 1600  vancomycin (VANCOCIN) 1,250 mg in sodium chloride 0.9 % 250 mL IVPB  Status:  Discontinued     1,250 mg 166.7 mL/hr over 90 Minutes Intravenous Every 24 hours 08/23/19 1446 08/25/19 1300   08/23/19 1530  vancomycin (VANCOCIN) 1,750 mg in sodium chloride 0.9 % 500 mL IVPB     1,750  mg 250 mL/hr over 120 Minutes Intravenous  Once 08/23/19 1441 08/23/19 1727   08/21/19 1800  piperacillin-tazobactam (ZOSYN) IVPB 3.375 g     3.375 g 12.5 mL/hr over 240 Minutes Intravenous Every 8 hours 08/21/19 1749     08/19/19 0600  cefoTEtan (CEFOTAN) 2 g in sodium chloride 0.9 % 100 mL IVPB     2 g 200 mL/hr over 30 Minutes Intravenous On call to O.R. 08/18/19 1506 08/19/19 0603   08/18/19 0600  cefoTEtan (CEFOTAN) 2 g in sodium chloride 0.9 % 100 mL IVPB     2 g 200 mL/hr over 30 Minutes Intravenous On call to O.R. 08/17/19 2242 08/18/19 1053     Subjective  The patient is resting comfortably. She had a difficult night with fevers and diarrhea.   Objective   Vitals:  Vitals:   08/25/19 1220 08/25/19 1357  BP: (!) 143/61 (!) 141/54  Pulse: 92 91  Resp: 18 (!) 22  Temp: (!) 102.1 F (38.9 C) (!) 101 F (38.3 C)  SpO2: 95% 95%   Exam:  Constitutional:  The patient is awake, alert, and oriented x 3. She is tired.  Respiratory:  . No increased work of breathing. . No wheezes, rales, or rhonchi. . No tactile fremitus Cardiovascular:  . Regular rate and rhythm . No murmurs, ectopy, or gallups. . No lateral PMI. No thrills. Abdomen:  . Abdomen is soft, slightly tender and distended . No hernias, masses, or organomegaly . Hyperactive bowel sounds.  Musculoskeletal:  . No cyanosis, clubbing, or edema  Skin:  . No rashes, lesions, ulcers . palpation of skin: no induration or nodules Neurologic:  . CN 2-12 intact . Sensation all 4 extremities intact Psychiatric:  . Mental status o Mood, affect appropriate o Orientation to person, place, time  . judgment and insight appear intact  I have personally reviewed the following:   Today's Data  . Vitals, CMP, CBC, procalcitonin  Micro Data  . Blood cultures x 2 have had no growth.  Imaging  . CT abdomen and pelvis 11/21/20920 . 2 View abdomen 08/21/2019 . CTA chest: negative for PE, demonstrated ground glass  opacities in the right upper, middle and lower lobes.  . CXR 08/25/2019: Stable appearance of infiltrates.  Scheduled Meds: . acidophilus  1 capsule Oral Daily  . amLODipine  10 mg Oral QHS  . azithromycin  500 mg Oral Daily  . feeding supplement (ENSURE ENLIVE)  237 mL Oral BID BM  . fluticasone furoate-vilanterol  1 puff Inhalation BH-q7a  . heparin  5,000 Units Subcutaneous Q8H  . hydrALAZINE  75 mg Oral Q6H  . metoprolol succinate  100 mg Oral Daily  . mirtazapine  15 mg Oral QHS  . montelukast  10 mg Oral QHS  . OLANZapine  10 mg Oral QHS  . pantoprazole  40 mg Oral QHS  . potassium chloride SA  20 mEq Oral Daily  . pregabalin  75 mg Oral BID  . sertraline  200 mg Oral QHS  . terazosin  1 mg Oral QHS   Continuous Infusions: . sodium chloride 250 mL (08/25/19 0253)  . piperacillin-tazobactam (ZOSYN)  IV 3.375 g (08/25/19 1041)    Principal Problem:   Aspiration pneumonia of right lung ([place]) Active Problems:   GERD (gastroesophageal reflux disease)   SOB (shortness of breath)   Malignant neoplasm of colon ([facility])   Small cell lung cancer ([place])   Essential hypertension   S/P closure of ileostomy   LOS: 7 days   A & P  Aspiration PNA: The patient is receiving IV Zosyn. Blood cultures x 2 have had no growth. Respiratory status is at baseline. Do not feel that fevers last night are related to pneumonia. Aspiration pneumoniahas responded well to zosyn and we will continue IV antibiotics for now. CTA chest was negative for PE. Repeat CXR demonstrates stable appearance of infiltrates. Doppler of lower extremities bilaterally was also negative for DVT. aO2 is 95% on room air. Cough is improving.  Small cell lung cancer/ colon cancer: Pt is followed closely by her oncologist .  GERD: PPI therapy continued.  HTN: Meds reviewed. Pt has been continued on her Hydralazine (changed to 75 mg tid) and metoprolol and hytrin continued.  I have seen and examined this patient myself. I  have spent 35 minutes in her evaluation and care.  Alvia Tory, DO [facility] Direct contact: see [email or web address]  7PM-7AM contact night coverage as above  08/25/2019, 3:15 PM  LOS: 5 days

## 2019-08-25 NOTE — Progress Notes (Signed)
Surgery service notified about high fevers over the night and the patient has started to report having diarrhea.

## 2019-08-26 DIAGNOSIS — Z923 Personal history of irradiation: Secondary | ICD-10-CM

## 2019-08-26 DIAGNOSIS — Z9221 Personal history of antineoplastic chemotherapy: Secondary | ICD-10-CM

## 2019-08-26 LAB — CULTURE, BLOOD (ROUTINE X 2)
Culture: NO GROWTH
Culture: NO GROWTH
Special Requests: ADEQUATE
Special Requests: ADEQUATE

## 2019-08-26 LAB — COMPREHENSIVE METABOLIC PANEL
ALT: 29 U/L (ref 0–44)
AST: 22 U/L (ref 15–41)
Albumin: 2.6 g/dL — ABNORMAL LOW (ref 3.5–5.0)
Alkaline Phosphatase: 132 U/L — ABNORMAL HIGH (ref 38–126)
Anion gap: 13 (ref 5–15)
BUN: 8 mg/dL (ref 8–23)
CO2: 24 mmol/L (ref 22–32)
Calcium: 8.4 mg/dL — ABNORMAL LOW (ref 8.9–10.3)
Chloride: 105 mmol/L (ref 98–111)
Creatinine, Ser: 0.87 mg/dL (ref 0.44–1.00)
GFR calc Af Amer: >60 mL/min (ref >60)
GFR calc non Af Amer: >60 mL/min (ref >60)
Glucose, Bld: 141 mg/dL — ABNORMAL HIGH (ref 70–99)
Potassium: 3 mmol/L — ABNORMAL LOW (ref 3.5–5.1)
Sodium: 142 mmol/L (ref 135–145)
Total Bilirubin: 0.7 mg/dL (ref 0.3–1.2)
Total Protein: 6.7 g/dL (ref 6.5–8.1)

## 2019-08-26 LAB — CBC
HCT: 28.8 % — ABNORMAL LOW (ref 36.0–46.0)
Hemoglobin: 9.3 g/dL — ABNORMAL LOW (ref 12.0–15.0)
MCH: 28.5 pg (ref 26.0–34.0)
MCHC: 32.3 g/dL (ref 30.0–36.0)
MCV: 88.3 fL (ref 80.0–100.0)
Platelets: 362 10*3/uL (ref 150–400)
RBC: 3.26 MIL/uL — ABNORMAL LOW (ref 3.87–5.11)
RDW: 14.5 % (ref 11.5–15.5)
WBC: 14.3 10*3/uL — ABNORMAL HIGH (ref 4.0–10.5)
nRBC: 0 % (ref 0.0–0.2)

## 2019-08-26 LAB — GLUCOSE, CAPILLARY
Glucose-Capillary: 249 mg/dL — ABNORMAL HIGH (ref 70–99)
Glucose-Capillary: 168 mg/dL — ABNORMAL HIGH (ref 70–99)
Glucose-Capillary: 213 mg/dL — ABNORMAL HIGH (ref 70–99)

## 2019-08-26 MED ORDER — INSULIN ASPART 100 UNIT/ML ~~LOC~~ SOLN
0.0000 [IU] | Freq: Three times a day (TID) | SUBCUTANEOUS | Status: DC
  Administered 2019-08-26: 3 [IU] via SUBCUTANEOUS
  Administered 2019-08-26: 2 [IU] via SUBCUTANEOUS
  Administered 2019-08-27: 1 [IU] via SUBCUTANEOUS
  Administered 2019-08-27 – 2019-08-28 (×2): 5 [IU] via SUBCUTANEOUS
  Filled 2019-08-26 (×5): qty 1

## 2019-08-26 MED ORDER — INSULIN ASPART 100 UNIT/ML ~~LOC~~ SOLN
0.0000 [IU] | Freq: Every day | SUBCUTANEOUS | Status: DC
  Administered 2019-08-26: 2 [IU] via SUBCUTANEOUS
  Filled 2019-08-26 (×2): qty 1

## 2019-08-26 MED ORDER — IPRATROPIUM-ALBUTEROL 0.5-2.5 (3) MG/3ML IN SOLN
3.0000 mL | RESPIRATORY_TRACT | Status: DC
  Administered 2019-08-26 – 2019-08-27 (×6): 3 mL via RESPIRATORY_TRACT
  Filled 2019-08-26 (×6): qty 3

## 2019-08-26 MED ORDER — PREDNISONE 20 MG PO TABS
20.0000 mg | ORAL_TABLET | Freq: Every day | ORAL | Status: DC
  Administered 2019-08-26: 20 mg via ORAL
  Filled 2019-08-26 (×2): qty 1

## 2019-08-26 MED ORDER — PREDNISONE 20 MG PO TABS
10.0000 mg | ORAL_TABLET | Freq: Every day | ORAL | Status: DC
  Administered 2019-08-27 – 2019-08-28 (×2): 10 mg via ORAL
  Filled 2019-08-26 (×2): qty 1

## 2019-08-26 MED ORDER — ADULT MULTIVITAMIN W/MINERALS CH
1.0000 | ORAL_TABLET | Freq: Every day | ORAL | Status: DC
  Administered 2019-08-27 – 2019-08-28 (×2): 1 via ORAL
  Filled 2019-08-26 (×2): qty 1

## 2019-08-26 NOTE — Progress Notes (Signed)
Physical Therapy Treatment Patient Details Name: Gloria Allen MRN: [id number] DOB: [birth year] Today's Date: 08/26/2019    History of Present Illness Gloria Allen is a 33yoF who comes [facility] on 11/17 for removal of Right IJ portacath, ileostomy takedown, and repair of parastomal hernia. Code stroke called on 11/19 after acute onset slurring of speech, changes to fine motor coordination, hypotension, and HA. Stat head CT negative, neurology not suspecting any ischaemic process.    PT Comments    Pt presented with minor deficits in strength, gait, balance, and activity tolerance but continues to make progress towards goals.  Pt motivated during the session despite c/o general fatigue.  Pt actively participated throughout the session and was able to amb 2 x 200' without an AD with slow cadence but steady without LOB.  Pt's SpO2 dropped to 89% after amb but increased back to baseline of 95% quickly upon return to sitting.  Pt continued to report feeling weaker than baseline and will benefit from HHPT services upon discharge to safely address above deficits for decreased caregiver assistance and eventual return to PLOF.     Follow Up Recommendations  Home health PT     Equipment Recommendations  None recommended by PT    Recommendations for Other Services       Precautions / Restrictions Precautions Precautions: None Restrictions Weight Bearing Restrictions: No    Mobility  Bed Mobility Overal bed mobility: Independent             General bed mobility comments: Good speed and effort during sup to sit without the need of the bed rail  Transfers Overall transfer level: Independent Equipment used: None             General transfer comment: Good control and stability during sit to/from stand transfers from various height surfaces  Ambulation/Gait Ambulation/Gait assistance: Supervision Gait Distance (Feet): 200 Feet x 2 Assistive device: None Gait Pattern/deviations:  Step-through pattern;Decreased step length - right;Decreased step length - left Gait velocity: decreased   General Gait Details: Slow cadence but steady with amb without LOB   Stairs             Wheelchair Mobility    Modified Rankin (Stroke Patients Only)       Balance Overall balance assessment: Needs assistance   Sitting balance-Leahy Scale: Normal     Standing balance support: No upper extremity supported;During functional activity Standing balance-Leahy Scale: Good                              Cognition Arousal/Alertness: Awake/alert Behavior During Therapy: WFL for tasks assessed/performed Overall Cognitive Status: Within Functional Limits for tasks assessed                                        Exercises Total Joint Exercises Ankle Circles/Pumps: Strengthening;Both;10 reps Quad Sets: Strengthening;Both;10 reps Gluteal Sets: Strengthening;Both;10 reps Towel Squeeze: Strengthening;Both;10 reps Heel Slides: AROM;Both;10 reps Hip ABduction/ADduction: Strengthening;Both;10 reps Long Arc Quad: Strengthening;Both;10 reps;15 reps Knee Flexion: Strengthening;Both;10 reps;15 reps Other Exercises Other Exercises: HEP education/review for BLE APs, QS, GS, and LAQ x 10 each 5-6x/day    General Comments        Pertinent Vitals/Pain Pain Assessment: No/denies pain    Home Living  Prior Function            PT Goals (current goals can now be found in the care plan section) Progress towards PT goals: Progressing toward goals    Frequency    Min 2X/week      PT Plan Current plan remains appropriate    Co-evaluation              AM-PAC PT "6 Clicks" Mobility   Outcome Measure  Help needed turning from your back to your side while in a flat bed without using bedrails?: None Help needed moving from lying on your back to sitting on the side of a flat bed without using bedrails?:  None Help needed moving to and from a bed to a chair (including a wheelchair)?: None Help needed standing up from a chair using your arms (e.g., wheelchair or bedside chair)?: None Help needed to walk in hospital room?: A Little Help needed climbing 3-5 steps with a railing? : A Little 6 Click Score: 22    End of Session   Activity Tolerance: Patient tolerated treatment well Patient left: in chair;with call bell/phone within reach Nurse Communication: Mobility status PT Visit Diagnosis: Unsteadiness on feet (R26.81);Other abnormalities of gait and mobility (R26.89);Muscle weakness (generalized) (M62.81)     Time: 5189-8421 PT Time Calculation (min) (ACUTE ONLY): 23 min  Charges:  $Gait Training: 8-22 mins $Therapeutic Exercise: 8-22 mins                     D. Scott Jamice Carreno PT, DPT 08/26/19, 1:18 PM

## 2019-08-26 NOTE — Progress Notes (Addendum)
Patient seen and evaluated with Mr. Olean Ree and agree with his note.  Patient remains with low grade temps.  CT scan of abdomen/pelvis yesterday only showed some fluid under the ileostomy incision, but otherwise no abscess.  C-diff negative, and hemoccult negative.  Denies any significant abdominal pain, and is only with some mild cough.  Ileostomy wound explored by removing one additional staple and probing with Q-tip, showing only some old seroma but no purulent fluid.  At this point, I'm not concerned about a wound infection causing her intermittent fevers.  CT of chest last week showed possible pneumonia, but after discussing with pulmonary team, the thought is that this is radiation pneumonitis instead.  Agree with starting steroids for this and stopping antibiotics per ID team since we do not have a clear source for abx therapy.  Olean Ree, MD    [facility] Day(s): 8.   Post op day(s): 8 Days Post-Op.   Interval History:  Patient seen and examined no acute events or new complaints overnight.  Patient reports she feels okay. No abdominal pain, nausea, or emesis., She does endorse a dry cough this morning.  Continued fevers overnight, tachycardic to 111 this morning WBC - 14.3K (increased)  Hypokalemia (3.0) C diff neg, FOB neg CT without evidence of wound infection or intra-abdominal process aside from post-surgical changes.    Vital signs in last 24 hours: [min-max] current  Temp:  [99.1 F (37.3 C)-102.1 F (38.9 C)] 99.9 F (37.7 C) (11/25 0520) Pulse Rate:  [89-115] 111 (11/25 0604) Resp:  [18-22] 18 (11/25 0520) BP: (141-205)/(54-67) 177/56 (11/25 0604) SpO2:  [94 %-99 %] 95 % (11/25 0604)     Height: 5\' 5"  (165.1 cm) Weight: 70.9 kg BMI (Calculated): 26.01   Intake/Output last 2 shifts:  11/24 0701 - 11/25 0700 In: 582.1 [P.O.:240; I.V.:93.8; IV Piggyback:248.4] Out: -    Physical Exam:  Constitutional: alert,  cooperative and no distress  Respiratory: breathing non-labored at rest  Cardiovascular: regular rate and sinus rhythm  Gastrointestinal: soft, incisional soreness, and non-distended. No rebound/guarding Integumentary: ileostomy site probed today revealing seroma and serosanguinous drainage, no purulence, no erythema.   Labs:  CBC Latest Ref Rng & Units 08/25/2019 08/24/2019 08/23/2019  WBC 4.0 - 10.5 K/uL 12.0(H) 11.7(H) 12.7(H)  Hemoglobin 12.0 - 15.0 g/dL 9.0(L) 8.9(L) 9.3(L)  Hematocrit 36.0 - 46.0 % 28.2(L) 26.4(L) 28.5(L)  Platelets 150 - 400 K/uL 307 253 227   CMP Latest Ref Rng & Units 08/24/2019 08/23/2019 08/22/2019  Glucose 70 - 99 mg/dL 140(H) 137(H) 143(H)  BUN 8 - 23 mg/dL 7(L) 8 8  Creatinine 0.44 - 1.00 mg/dL 0.73 0.72 0.64  Sodium 135 - 145 mmol/L 140 138 140  Potassium 3.5 - 5.1 mmol/L 3.5 3.3(L) 3.4(L)  Chloride 98 - 111 mmol/L 105 104 109  CO2 22 - 32 mmol/L 23 21(L) 21(L)  Calcium 8.9 - 10.3 mg/dL 8.6(L) 8.6(L) 8.4(L)  Total Protein 6.5 - 8.1 g/dL - 6.3(L) 6.3(L)  Total Bilirubin 0.3 - 1.2 mg/dL - 0.6 0.6  Alkaline Phos 38 - 126 U/L - 125 107  AST 15 - 41 U/L - 21 23  ALT 0 - 44 U/L - 21 19    Imaging studies: No new pertinent imaging studies   Assessment/Plan:  73 y.o. female with continued fevers of uncertain origin felt possibly due to radiation pneumonitis as fever work up to this point has been unremarkable 8 Days Post-Op s/p ileostomy reversal following  an LAR for rectosigmoid carcinoma in February of this year   - Continue regular diet    - ID Following; may stop ABx and watch and wait as no clear infection source  - Pulmonary following: initiate PO steroids for possible radiation pneumonitis  - Monitor leukocytosis; suspect increase with steroids   -  pain control prn; antiemetics prn - Monitor abdominal examination; on-going bowel function  - mobilization encouraged; PT following- recommending HHPT - medical  management of comorbidconditions; resume HH at discharge - DVT prophylaxis  All of the above findings and recommendations were discussed with the patient, and the medical team, and all of patient's questions were answered to her expressed satisfaction.  -- Edison Simon, PA-C [facility] 08/26/2019, 7:17 AM [phone] M-F: 7am - 4pm

## 2019-08-26 NOTE — Progress Notes (Signed)
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 The hospitalist service was consulted out of concern for possible aspiration pneumonia resulting in fevers and dyspnea by the patient on 08/22/2019.  She has a past medical history is significant for hypertension, colon cancer, chronic pain, DDD of cervical and lumbar vertebral bodies, small cell lung CA depression, entrapment syndrome, GERD, headaches, chronic itching and UTI.  Abdominal film performed on 08/21/2019 has demonstrated new ground-glass opacity and consolidation in the right perihilar lung concerning for pneumonia. There is also a small focus of opacity in the left peripheral mid lung thought to correspond to a pulmonary nodule noted on prior CT. She has also been febrile and dyspneic with a non-productive cough. She is receiving IV Zosyn and breathing treatments.   Assessment & Plan:   Principal Problem:   Aspiration pneumonia of right lung (HCC) Active Problems:   GERD (gastroesophageal reflux disease)   SOB (shortness of breath)   Malignant neoplasm of colon (HCC)   Small cell lung cancer ([place])   Essential hypertension   S/P closure of ileostomy   Aspiration PNA: The patient is still receiving IV Zosyn per pharm dosing. Blood cultures x 2 have had no growth, UCX neg. Respiratory status is at baseline. Aspiration pneumonia has responded well to zosyn and we will continue IV antibiotics for now. CTA chest was negative for PE. Repeat CXR demonstrates stable appearance of infiltrates. Doppler of lower extremities bilaterally was also negative for DVT. aO2 is 95% on room air. Cough is improving, but WBC bumped ID following, at this point will defer to their  recs  Small cell lung cancer/ colon cancer: Pt is followed closely by her oncologist .  GERD:PPI therapy continued.  HTN: Meds reviewed. Pt has been continued on her Hydralazine (changed to 75 mg tid) and metoprolol and hytrin continued.  DVT prophylaxis: Heparin SQ  Code Status: full    Code Status Orders  (From admission, onward)         Start     Ordered   08/18/19 1507  Full code  Continuous     08/18/19 1506        Code Status History    Date Active Date Inactive Code Status Order ID Comments User Context   03/25/2019 0221 03/27/2019 2012 Full Code [id number]  Mayer Camel, NP ED   11/28/2018 1137 12/03/2018 1801 DNR [id number]  Demetrios Loll, MD Inpatient   11/27/2018 2029 11/28/2018 1137 Full Code [id number]  Jules Husbands, MD Inpatient   11/21/2018 1945 11/22/2018 0017 Full Code [id number]  Corky Mull, MD ED   03/26/2017 1223 03/27/2017 1740 Full Code [id number]  Thornton Park, MD Inpatient   Advance Care Planning Activity    Advance Directive Documentation     Most Recent Value  Type of Advance Directive  Out of facility DNR (pink MOST or yellow form)  Pre-existing out of facility DNR order (yellow form or pink MOST form)  Physician notified to receive inpatient order  "MOST" Form in Place?  -     Family Communication: Per primary team Disposition Plan:   Per primary team Consults called: [place] Admission status: Inpatient   Consultants:   TRH  Procedures:  Dg Chest 1 View  Result Date: 08/25/2019 CLINICAL DATA:  Pneumonia. EXAM: CHEST  1 VIEW COMPARISON:  CTA of the chest 08/23/2019 FINDINGS: Heart is enlarged. There is no edema. Asymmetric right-sided airspace disease is again noted. Left lung is clear. Small right pleural effusion is again noted. IMPRESSION: 1. Stable right-sided airspace disease compatible with pneumonia. 2. Stable small right pleural effusion. 3. Stable cardiomegaly without failure. Electronically Signed   By: San Morelle M.D.   On:  08/25/2019 11:20   Dg Lumbar Spine Complete W/bend  Result Date: 07/30/2019 CLINICAL DATA:  Low back pain for several years, no known injury, initial encounter EXAM: LUMBAR SPINE - COMPLETE WITH BENDING VIEWS COMPARISON:  01/31/2017 FINDINGS: Five lumbar type vertebral bodies are well visualized. Osteophytic changes are noted. Facet hypertrophic changes are noted as well. No pars defects are seen. Degenerative anterolisthesis of L4 on L5 is seen stable in appearance from the prior exam. Flexion and extension views show no significant instability. Postsurgical changes in the pelvis are noted. IMPRESSION: Multilevel degenerative change with anterolisthesis of L4 on L5 without significant instability. Electronically Signed   By: Inez Catalina M.D.   On: 07/30/2019 00:35   Dg Si Joints  Result Date: 07/29/2019 CLINICAL DATA:  Low back pain since retiring in 2002, no known injury, history degenerative disc disease EXAM: BILATERAL SACROILIAC JOINTS - 3+ VIEW COMPARISON:  07/05/2017 FINDINGS: Osseous demineralization. Hip and SI joint spaces preserved. Degenerative disc and facet disease changes at visualized lower lumbar spine. No fracture or bone destruction. No radiographic evidence of sacroiliitis. IMPRESSION: Osseous demineralization with degenerative disc and facet disease changes of lower lumbar spine. No acute SI joint abnormalities. Electronically Signed   By: Lavonia Dana M.D.   On: 07/29/2019 18:07   Ct Angio Chest Pe W Or Wo Contrast  Result Date: 08/23/2019 CLINICAL DATA:  Shortness of breath EXAM: CT ANGIOGRAPHY CHEST WITH CONTRAST TECHNIQUE: Multidetector CT imaging of the chest was performed using the standard protocol during bolus administration of intravenous contrast. Multiplanar CT image reconstructions and MIPs were obtained to evaluate the vascular anatomy. CONTRAST:  36mL OMNIPAQUE IOHEXOL 350 MG/ML SOLN COMPARISON:  06/05/2019 FINDINGS: Cardiovascular: Contrast injection is sufficient  to demonstrate satisfactory opacification of the pulmonary arteries to the segmental level. There is no pulmonary embolus. The main pulmonary artery is dilated measuring approximately 3.5 cm. There is no CT evidence of acute right heart strain. The visualized aorta is normal. Heart size is normal, without pericardial effusion. Mediastinum/Nodes: --No mediastinal or hilar lymphadenopathy. --No axillary lymphadenopathy. --No supraclavicular lymphadenopathy. --Normal thyroid gland. --The esophagus is unremarkable Lungs/Pleura: There confluent ground-glass airspace opacities within the right upper lobe and to a lesser extent right middle and right lower lobes. There is fluid along the major and minor fissures. There is some smooth interlobular septal thickening involving the right upper lobe. There are a few peripheral subcentimeter pulmonary nodules in the right upper lobe. There is a relatively stable pulmonary opacity in the left upper lobe measuring approximately 1.8 cm (previously measuring the same). There is a small right-sided pleural effusion with adjacent atelectasis. Upper Abdomen: No acute abnormality. Musculoskeletal: No chest wall abnormality. No acute or significant osseous findings. There is some subcutaneous fat stranding involving the right chest wall. The this is favored to represent sequela of a recently removed right-sided Port-A-Cath. Review of the MIP images confirms the above findings. IMPRESSION: 1. Ground-glass airspace opacities and consolidation throughout the right lung field is concerning for multifocal pneumonia or aspiration  in the appropriate clinical setting. 2. Small right-sided parapneumonic effusion. 3. Stable pulmonary opacity in the left upper lobe currently measuring 1.8 cm. 4. No acute pulmonary embolism. The main pulmonary artery is dilated which can be seen in patients with elevated pulmonary artery pressures. Electronically Signed   By: Constance Holster M.D.   On: 08/23/2019  17:51   Ct Abdomen Pelvis W Contrast  Result Date: 08/25/2019 CLINICAL DATA:  Abdominal pain and fever, history of recent ileostomy reversal. EXAM: CT ABDOMEN AND PELVIS WITH CONTRAST TECHNIQUE: Multidetector CT imaging of the abdomen and pelvis was performed using the standard protocol following bolus administration of intravenous contrast. CONTRAST:  140mL OMNIPAQUE IOHEXOL 300 MG/ML  SOLN COMPARISON:  08/22/2019 FINDINGS: Lower chest: Small right pleural effusion is now seen new from the prior exam. Hepatobiliary: Fatty infiltration of the liver is noted. The gallbladder has been surgically removed. A small cyst is noted within the right lobe of the liver inferiorly. Pancreas: Unremarkable. No pancreatic ductal dilatation or surrounding inflammatory changes. Spleen: Normal in size without focal abnormality. Adrenals/Urinary Tract: Adrenal glands are stable in appearance. Kidneys demonstrate a normal enhancement pattern similar to that seen on the prior exam. Delayed images demonstrate normal excretion of contrast from both kidneys. A small less than 1 cm left renal cyst is seen. The bladder is well distended. Stomach/Bowel: The rectum is noted. There is a low anastomosis to the rectum identified with multiple edematous loops of colon proximal to this although no obstructive changes are seen. The terminal ileum is within normal limits. Previously seen fluid collection adjacent to the cecal tip is again identified best seen on image number 56 of series 2. This measures approximately 4.0 x 1.5 cm and is stable in appearance from the prior exam. Mild enhancement is noted and again may represent a small postoperative hematoma or complex seroma. The degree of gas locules in the collection have decreased in the interval from the prior exam. Ileal anastomosis is again identified without evidence of extravasation. The postsurgical changes in the anterior abdominal wall are stable. Fluid and air is noted. More  proximal small bowel is within normal limits. Vascular/Lymphatic: Aortic atherosclerosis. No enlarged abdominal or pelvic lymph nodes. Reproductive: Status post hysterectomy. No adnexal masses. Other: No abdominal wall hernia or abnormality. No abdominopelvic ascites. Musculoskeletal: Degenerative changes of lumbar spine are seen and stable from the prior exam. IMPRESSION: New right-sided pleural effusion. Stable mildly peripherally enhancing fluid collection in the right lower quadrant adjacent to the cecal tip. This again likely represents a small postoperative hematoma or mildly complex seroma. The need for continued follow-up can be determined on a clinical basis. Changes consistent with the recent ileostomy reversal in the right anterior abdominal wall. Edematous changes are noted within the abdomen related to the recent surgery stable in appearance from the prior exam. No new focal abnormality is noted. Electronically Signed   By: Inez Catalina M.D.   On: 08/25/2019 19:49   Ct Abdomen Pelvis W Contrast  Result Date: 08/22/2019 CLINICAL DATA:  Fever. Worsening abdominal pain after ileostomy reversal 4 days ago. EXAM: CT ABDOMEN AND PELVIS WITH CONTRAST TECHNIQUE: Multidetector CT imaging of the abdomen and pelvis was performed using the standard protocol following bolus administration of intravenous contrast. CONTRAST:  174mL OMNIPAQUE IOHEXOL 300 MG/ML  SOLN COMPARISON:  06/05/2019 FINDINGS: Lower chest: Patchy ground-glass attenuation noted central right lower lobe. Small right pleural effusion is associated with right base atelectasis. Hepatobiliary: Stable tiny cyst inferior right hepatic lobe. Gallbladder  surgically absent. No intrahepatic or extrahepatic biliary dilation. Pancreas: No focal mass lesion. No dilatation of the main duct. No intraparenchymal cyst. No peripancreatic edema. Spleen: No splenomegaly. No focal mass lesion. Adrenals/Urinary Tract: No adrenal nodule or mass. Tiny calcifications  in the hilum of the left kidney are vascular, best appreciated on delayed imaging. 5 mm low-density lesion upper pole left kidney is too small to characterize but likely benign. Right kidney unremarkable. No evidence for hydroureter. The urinary bladder appears normal for the degree of distention. Stomach/Bowel: Stomach is unremarkable. No gastric wall thickening. No evidence of outlet obstruction. Duodenum is normally positioned as is the ligament of Treitz. No small bowel wall thickening. No small bowel dilatation. Status post ileal anastomosis right lower quadrant in the region of the distal ileum. Terminal ileum unremarkable. Oral contrast material has migrated through the ileal anastomosis and terminal ileum and is just starting to opacified the cecum. No evidence for contrast extravasation at the ileal anastomosis. The appendix is not visualized, but there is no edema or inflammation in the region of the cecum. Status post left hemicolectomy with rectal anastomosis. Vascular/Lymphatic: There is abdominal aortic atherosclerosis without aneurysm. There is no gastrohepatic or hepatoduodenal ligament lymphadenopathy. No intraperitoneal or retroperitoneal lymphadenopathy. No pelvic sidewall lymphadenopathy. Reproductive: The uterus is surgically absent. There is no adnexal mass. Other: Small collection of fluid and gas identified right lower anterior abdominal wall near the location of the ileostomy take down. This is not unexpected on postoperative day 4 and may reflect a tiny residual seroma or hematoma. There is some fluid in the anterior peritoneal cavity of the right lower quadrant around the small bowel anastomosis and cecal tip (see image 61 of series 2). This fluid has attenuation higher than would be expected for serous fluid. Several small gas locules are seen within this fluid and there is edema/stranding in the ileocolic mesentery Musculoskeletal: Gas locules in the subcutaneous fat of the left anterior  abdominal wall are likely injection sites. No worrisome lytic or sclerotic osseous abnormality. IMPRESSION: 1. Small fluid collection anterior right lower quadrant around the distal ileal anastomosis and cecal tip. Attenuation of the fluid is higher than would be expected for serous fluid and several small gas bubbles are evident within the fluid. There may be some overlying peritoneal enhancement in this region. Postoperative day 4 would be earlier than expected for abscess and features likely reflect some residual small hematoma/blood products. Extraluminal gas is not unexpected on postoperative day 4. 2. Subcutaneous gas in small subcutaneous fluid collection in the right anterior abdominal wall at the location of ileostomy takedown. This is probably a tiny residual seroma, and again, extraluminal gas is not unexpected 4 days after surgery. 3. No evidence for bowel obstruction or anastomotic leak. The oral contrast has migrated through the ileal anastomosis into the terminal ileum and cecum. Contrast has not yet reached the distal colon although there is no evidence for fluid around the rectal anastomosis. 4. Edema/stranding in the ileocolic mesentery is presumably sequelae of recent surgery. 5. Trace right lower lobe collapse/consolidation with small right pleural effusion. 6.  Aortic Atherosclerois (ICD10-170.0) Electronically Signed   By: Misty Stanley M.D.   On: 08/22/2019 13:14   US Venous Img Lower Bilateral (dvt)  Result Date: 08/22/2019 CLINICAL DATA:  Dyspnea.  Inpatient. EXAM: BILATERAL LOWER EXTREMITY VENOUS DOPPLER ULTRASOUND TECHNIQUE: Gray-scale sonography with graded compression, as well as color Doppler and duplex ultrasound were performed to evaluate the lower extremity deep venous systems from the  level of the common femoral vein and including the common femoral, femoral, profunda femoral, popliteal and calf veins including the posterior tibial, peroneal and gastrocnemius veins when  visible. The superficial great saphenous vein was also interrogated. Spectral Doppler was utilized to evaluate flow at rest and with distal augmentation maneuvers in the common femoral, femoral and popliteal veins. COMPARISON:  None. FINDINGS: RIGHT LOWER EXTREMITY Common Femoral Vein: No evidence of thrombus. Normal compressibility, respiratory phasicity and response to augmentation. Saphenofemoral Junction: No evidence of thrombus. Normal compressibility and flow on color Doppler imaging. Profunda Femoral Vein: No evidence of thrombus. Normal compressibility and flow on color Doppler imaging. Femoral Vein: No evidence of thrombus. Normal compressibility, respiratory phasicity and response to augmentation. Popliteal Vein: No evidence of thrombus. Normal compressibility, respiratory phasicity and response to augmentation. Calf Veins: No evidence of thrombus. Normal compressibility and flow on color Doppler imaging. Superficial Great Saphenous Vein: No evidence of thrombus. Normal compressibility. Venous Reflux:  None. Other Findings:  None. LEFT LOWER EXTREMITY Common Femoral Vein: No evidence of thrombus. Normal compressibility, respiratory phasicity and response to augmentation. Saphenofemoral Junction: No evidence of thrombus. Normal compressibility and flow on color Doppler imaging. Profunda Femoral Vein: No evidence of thrombus. Normal compressibility and flow on color Doppler imaging. Femoral Vein: No evidence of thrombus. Normal compressibility, respiratory phasicity and response to augmentation. Popliteal Vein: No evidence of thrombus. Normal compressibility, respiratory phasicity and response to augmentation. Calf Veins: No evidence of thrombus. Normal compressibility and flow on color Doppler imaging. Superficial Great Saphenous Vein: No evidence of thrombus. Normal compressibility. Venous Reflux:  None. Other Findings:  None. IMPRESSION: No evidence of deep venous thrombosis in either lower extremity.  Electronically Signed   By: Ilona Sorrel M.D.   On: 08/22/2019 18:33   Dg Abd Acute 2+v W 1v Chest  Result Date: 08/21/2019 CLINICAL DATA:  Fever, generalized weakness EXAM: DG ABDOMEN ACUTE W/ 1V CHEST COMPARISON:  04/28/2019, CT 06/05/2019 FINDINGS: Single-view chest demonstrates new ground-glass opacity and consolidation in the right perihilar lung. Smaller focus of airspace disease in the left mid to lower lung, likely corresponding to pulmonary lung nodule. Mild cardiomegaly. No pneumothorax. Postsurgical changes in the right humeral head. Supine and upright views of the abdomen demonstrate no free air beneath the diaphragm. Postsurgical changes in the pelvis. Cutaneous staples over the right lower quadrant. Air distended probable large bowel with fluid levels in the right lower quadrant, no definite dilated small bowel. IMPRESSION: 1. New ground-glass opacity and consolidation in the right perihilar lung concerning for pneumonia. Radiographic follow-up to resolution recommended. Small focus of opacity in the left peripheral mid lung thought to correspond to a pulmonary nodule noted on prior CT 2. Mild air distended right lower quadrant colon, though without small bowel distention to suggest obstruction. These results will be called to the ordering clinician or representative by the Radiologist Assistant, and communication documented in the PACS or zVision Dashboard. Electronically Signed   By: Donavan Foil M.D.   On: 08/21/2019 21:02   Ct Head Code Stroke Wo Contrast`  Result Date: 08/20/2019 CLINICAL DATA:  Code stroke. Right upper extremity weakness developing of the last 90 minutes. Possible slurred speech. EXAM: CT HEAD WITHOUT CONTRAST TECHNIQUE: Contiguous axial images were obtained from the base of the skull through the vertex without intravenous contrast. COMPARISON:  02/26/2019.  04/07/2016. FINDINGS: Brain: Chronic small-vessel ischemic changes affect the cerebral hemispheric white matter.  No sign of acute infarction, mass lesion, hemorrhage, hydrocephalus or extra-axial collection. Vascular:  No abnormal vascular finding.  No hyperdense vessel. Skull: Negative Sinuses/Orbits: Clear/normal Other: None ASPECTS ([place] Stroke Program Early CT Score) - Ganglionic level infarction (caudate, lentiform nuclei, internal capsule, insula, M1-M3 cortex): 7 - Supraganglionic infarction (M4-M6 cortex): 3 Total score (0-10 with 10 being normal): 10 IMPRESSION: 1. No acute finding. Chronic small-vessel ischemic changes of the white matter. 2. ASPECTS is 10 3. Call report in progress. Electronically Signed   By: Nelson Chimes M.D.   On: 08/20/2019 15:38     Antimicrobials:   Zosyn 11/20>   Subjective: No acute events overnight did have a T-max of 102.1 reported no additional diarrhea but obvious fevers  Objective: Vitals:   08/26/19 1046 08/26/19 1046 08/26/19 1109 08/26/19 1319  BP: (!) 152/65 (!) 152/65  (!) 171/62  Pulse: 88 88  92  Resp: 18     Temp: 98.7 F (37.1 C) 98.7 F (37.1 C)  100 F (37.8 C)  TempSrc: Oral Oral  Oral  SpO2: 97% 97% 94% 95%  Weight:      Height:        Intake/Output Summary (Last 24 hours) at 08/26/2019 1352 Last data filed at 08/26/2019 0200 Gross per 24 hour  Intake 582.12 ml  Output -  Net 582.12 ml   Filed Weights   08/18/19 0931  Weight: 70.9 kg    Examination:  General exam: Appears calm and comfortable  Respiratory system: Clear to auscultation. Respiratory effort normal. Cardiovascular system: S1 & S2 heard, RRR. No JVD, murmurs, rubs, gallops or clicks. No pedal edema. Gastrointestinal system: Positive bowel sounds Central nervous system: Alert and oriented. No focal neurological deficits. Extremities: Warm well perfused no contractures. Skin: No rashes, lesions or ulcers Psychiatry: Judgement and insight appear normal. Mood & affect appropriate.     Data Reviewed: I have personally reviewed following labs and imaging  studies  CBC: Recent Labs  Lab 08/22/19 0559 08/23/19 0612 08/24/19 0420 08/25/19 0743 08/26/19 0730  WBC 13.7* 12.7* 11.7* 12.0* 14.3*  NEUTROABS  --  10.6*  --   --   --   HGB 9.8* 9.3* 8.9* 9.0* 9.3*  HCT 29.9* 28.5* 26.4* 28.2* 28.8*  MCV 86.9 85.1 84.9 88.4 88.3  PLT 218 227 253 307 295   Basic Metabolic Panel: Recent Labs  Lab 08/21/19 0635 08/22/19 0559 08/23/19 0612 08/24/19 0420 08/26/19 0730  NA 140 140 138 140 142  K 3.7 3.4* 3.3* 3.5 3.0*  CL 110 109 104 105 105  CO2 20* 21* 21* 23 24  GLUCOSE 148* 143* 137* 140* 141*  BUN 10 8 8  7* 8  CREATININE 0.66 0.64 0.72 0.73 0.87  CALCIUM 8.5* 8.4* 8.6* 8.6* 8.4*  MG 1.7  --  1.7  --   --   PHOS 2.2*  --  2.8  --   --    GFR: Estimated Creatinine Clearance: 58.6 mL/min (by C-G formula based on SCr of 0.87 mg/dL). Liver Function Tests: Recent Labs  Lab 08/21/19 0635 08/22/19 0559 08/23/19 0612 08/26/19 0730  AST 24 23 21 22   ALT 16 19 21 29   ALKPHOS 85 107 125 132*  BILITOT 0.5 0.6 0.6 0.7  PROT 6.4* 6.3* 6.3* 6.7  ALBUMIN 2.9* 2.8* 2.7* 2.6*   No results for input(s): LIPASE, AMYLASE in the last 168 hours. No results for input(s): AMMONIA in the last 168 hours. Coagulation Profile: No results for input(s): INR, PROTIME in the last 168 hours. Cardiac Enzymes: No results for input(s): CKTOTAL, CKMB, CKMBINDEX,  TROPONINI in the last 168 hours. BNP (last 3 results) No results for input(s): PROBNP in the last 8760 hours. HbA1C: No results for input(s): HGBA1C in the last 72 hours. CBG: Recent Labs  Lab 08/20/19 1517 08/26/19 1331  GLUCAP 245* 249*   Lipid Profile: No results for input(s): CHOL, HDL, LDLCALC, TRIG, CHOLHDL, LDLDIRECT in the last 72 hours. Thyroid Function Tests: No results for input(s): TSH, T4TOTAL, FREET4, T3FREE, THYROIDAB in the last 72 hours. Anemia Panel: No results for input(s): VITAMINB12, FOLATE, FERRITIN, TIBC, IRON, RETICCTPCT in the last 72 hours. Sepsis  Labs: Recent Labs  Lab 08/24/19 2015  PROCALCITON 0.24    Recent Results (from the past 240 hour(s))  CULTURE, BLOOD (ROUTINE X 2) w Reflex to ID Panel     Status: None   Collection Time: 08/21/19  5:45 PM   Specimen: BLOOD  Result Value Ref Range Status   Specimen Description BLOOD RT [facility]  Final   Special Requests   Final    BOTTLES DRAWN AEROBIC AND ANAEROBIC Blood Culture adequate volume   Culture   Final    NO GROWTH 5 DAYS Performed at [facility], [address] [place], [place] [address]    Report Status 08/26/2019 FINAL  Final  CULTURE, BLOOD (ROUTINE X 2) w Reflex to ID Panel     Status: None   Collection Time: 08/21/19  5:48 PM   Specimen: BLOOD  Result Value Ref Range Status   Specimen Description BLOOD R HAND  Final   Special Requests   Final    BOTTLES DRAWN AEROBIC AND ANAEROBIC Blood Culture adequate volume   Culture   Final    NO GROWTH 5 DAYS Performed at [facility], [address], [place], [place] [address]    Report Status 08/26/2019 FINAL  Final  SARS CORONAVIRUS 2 (TAT 6-24 HRS) Nasopharyngeal Nasopharyngeal Swab     Status: None   Collection Time: 08/21/19  6:30 PM   Specimen: Nasopharyngeal Swab  Result Value Ref Range Status   SARS Coronavirus 2 NEGATIVE NEGATIVE Final    Comment: (NOTE) SARS-CoV-2 target nucleic acids are NOT DETECTED. The SARS-CoV-2 RNA is generally detectable in upper and lower respiratory specimens during the acute phase of infection. Negative results do not preclude SARS-CoV-2 infection, do not rule out co-infections with other pathogens, and should not be used as the sole basis for treatment or other patient management decisions. Negative results must be combined with clinical observations, patient history, and epidemiological information. The expected result is Negative. Fact Sheet for Patients: SugarRoll.be Fact Sheet for Healthcare  Providers: https://www.woods-mathews.com/ This test is not yet approved or cleared by the [place] FDA and  has been authorized for detection and/or diagnosis of SARS-CoV-2 by FDA under an Emergency Use Authorization (EUA). This EUA will remain  in effect (meaning this test can be used) for the duration of the COVID-19 declaration under Section 56 4(b)(1) of the Act, 21 U.S.C. section 360bbb-3(b)(1), unless the authorization is terminated or revoked sooner. Performed at [facility], [place] [address], [place], [place] [address]   MRSA PCR Screening     Status: None   Collection Time: 08/23/19  3:28 PM   Specimen: Nasal Mucosa; Nasopharyngeal  Result Value Ref Range Status   MRSA by PCR NEGATIVE NEGATIVE Final    Comment:        The GeneXpert MRSA Assay (FDA approved for NASAL specimens only), is one component of a comprehensive MRSA colonization surveillance  program. It is not intended to diagnose MRSA infection nor to guide or monitor treatment for MRSA infections. Performed at [facility], [address] [place], [place] [address]   Culture, Urine     Status: None   Collection Time: 08/24/19  6:42 AM   Specimen: Urine, Random  Result Value Ref Range Status   Specimen Description   Final    URINE, RANDOM Performed at [facility], [address], [place], [place] [address]    Special Requests   Final    NONE Performed at [facility], [address], [place], [place] [address]    Culture   Final    NO GROWTH Performed at [facility], [place] [address], [place], [place] [address]    Report Status 08/25/2019 FINAL  Final  CULTURE, BLOOD (ROUTINE X 2) w Reflex to ID Panel     Status: None (Preliminary result)   Collection Time: 08/25/19  3:40 PM   Specimen: BLOOD  Result Value Ref Range Status   Specimen Description BLOOD RIGHT ANTECUBITAL  Final   Special Requests   Final    BOTTLES DRAWN AEROBIC AND ANAEROBIC  Blood Culture adequate volume   Culture   Final    NO GROWTH < 24 HOURS Performed at [facility], [address] [place], [place] [address]    Report Status PENDING  Incomplete  CULTURE, BLOOD (ROUTINE X 2) w Reflex to ID Panel     Status: None (Preliminary result)   Collection Time: 08/25/19  3:47 PM   Specimen: BLOOD  Result Value Ref Range Status   Specimen Description BLOOD BLOOD RIGHT WRIST  Final   Special Requests   Final    BOTTLES DRAWN AEROBIC AND ANAEROBIC Blood Culture adequate volume   Culture   Final    NO GROWTH < 24 HOURS Performed at [facility], [address], [place], [place] [address]    Report Status PENDING  Incomplete  C difficile quick scan w PCR reflex     Status: None   Collection Time: 08/25/19  6:12 PM   Specimen: STOOL  Result Value Ref Range Status   C Diff antigen NEGATIVE NEGATIVE Final   C Diff toxin NEGATIVE NEGATIVE Final   C Diff interpretation No C. difficile detected.  Final    Comment: Performed at [facility], [address], [place],  [address]         Radiology Studies: Dg Chest 1 View  Result Date: 08/25/2019 CLINICAL DATA:  Pneumonia. EXAM: CHEST  1 VIEW COMPARISON:  CTA of the chest 08/23/2019 FINDINGS: Heart is enlarged. There is no edema. Asymmetric right-sided airspace disease is again noted. Left lung is clear. Small right pleural effusion is again noted. IMPRESSION: 1. Stable right-sided airspace disease compatible with pneumonia. 2. Stable small right pleural effusion. 3. Stable cardiomegaly without failure. Electronically Signed   By: San Morelle M.D.   On: 08/25/2019 11:20   Ct Abdomen Pelvis W Contrast  Result Date: 08/25/2019 CLINICAL DATA:  Abdominal pain and fever, history of recent ileostomy reversal. EXAM: CT ABDOMEN AND PELVIS WITH CONTRAST TECHNIQUE: Multidetector CT imaging of the abdomen and pelvis was performed using the standard protocol following bolus  administration of intravenous contrast. CONTRAST:  160mL OMNIPAQUE IOHEXOL 300 MG/ML  SOLN COMPARISON:  08/22/2019 FINDINGS: Lower chest: Small right pleural effusion is now seen new from the prior exam. Hepatobiliary: Fatty infiltration of the liver is noted. The gallbladder has  been surgically removed. A small cyst is noted within the right lobe of the liver inferiorly. Pancreas: Unremarkable. No pancreatic ductal dilatation or surrounding inflammatory changes. Spleen: Normal in size without focal abnormality. Adrenals/Urinary Tract: Adrenal glands are stable in appearance. Kidneys demonstrate a normal enhancement pattern similar to that seen on the prior exam. Delayed images demonstrate normal excretion of contrast from both kidneys. A small less than 1 cm left renal cyst is seen. The bladder is well distended. Stomach/Bowel: The rectum is noted. There is a low anastomosis to the rectum identified with multiple edematous loops of colon proximal to this although no obstructive changes are seen. The terminal ileum is within normal limits. Previously seen fluid collection adjacent to the cecal tip is again identified best seen on image number 56 of series 2. This measures approximately 4.0 x 1.5 cm and is stable in appearance from the prior exam. Mild enhancement is noted and again may represent a small postoperative hematoma or complex seroma. The degree of gas locules in the collection have decreased in the interval from the prior exam. Ileal anastomosis is again identified without evidence of extravasation. The postsurgical changes in the anterior abdominal wall are stable. Fluid and air is noted. More proximal small bowel is within normal limits. Vascular/Lymphatic: Aortic atherosclerosis. No enlarged abdominal or pelvic lymph nodes. Reproductive: Status post hysterectomy. No adnexal masses. Other: No abdominal wall hernia or abnormality. No abdominopelvic ascites. Musculoskeletal: Degenerative changes of lumbar  spine are seen and stable from the prior exam. IMPRESSION: New right-sided pleural effusion. Stable mildly peripherally enhancing fluid collection in the right lower quadrant adjacent to the cecal tip. This again likely represents a small postoperative hematoma or mildly complex seroma. The need for continued follow-up can be determined on a clinical basis. Changes consistent with the recent ileostomy reversal in the right anterior abdominal wall. Edematous changes are noted within the abdomen related to the recent surgery stable in appearance from the prior exam. No new focal abnormality is noted. Electronically Signed   By: Inez Catalina M.D.   On: 08/25/2019 19:49        Scheduled Meds: . acidophilus  1 capsule Oral Daily  . amLODipine  10 mg Oral QHS  . feeding supplement (ENSURE ENLIVE)  237 mL Oral BID BM  . fluticasone furoate-vilanterol  1 puff Inhalation BH-q7a  . heparin  5,000 Units Subcutaneous Q8H  . hydrALAZINE  75 mg Oral Q6H  . insulin aspart  0-5 Units Subcutaneous QHS  . insulin aspart  0-9 Units Subcutaneous TID WC  . ipratropium-albuterol  3 mL Nebulization Q4H  . metoprolol succinate  100 mg Oral Daily  . mirtazapine  15 mg Oral QHS  . montelukast  10 mg Oral QHS  . [START ON 08/27/2019] multivitamin with minerals  1 tablet Oral Daily  . OLANZapine  10 mg Oral QHS  . pantoprazole  40 mg Oral QHS  . potassium chloride SA  20 mEq Oral Daily  . predniSONE  20 mg Oral Q breakfast  . pregabalin  75 mg Oral BID  . sertraline  200 mg Oral QHS  . terazosin  1 mg Oral QHS   Continuous Infusions: . sodium chloride 250 mL (08/26/19 0830)     LOS: 8 days    Time spent: 5 min    Nicolette Bang, MD [facility]  If 7PM-7AM, please contact night-coverage  08/26/2019, 1:52 PM

## 2019-08-26 NOTE — Care Management Important Message (Signed)
Important Message  Patient Details  Name: Gloria Allen MRN: [id number] Date of Birth: 11-16-1945   Medicare Important Message Given:  Yes     Dannette Barbara 08/26/2019, 11:12 AM

## 2019-08-26 NOTE — Progress Notes (Signed)
Nutrition Follow Up Note   DOCUMENTATION CODES:   Not applicable  INTERVENTION:   Ensure Enlive po TID, each supplement provides 350 kcal and 20 grams of protein  Magic cup TID with meals, each supplement provides 290 kcal and 9 grams of protein  Recommend daily MVI  NUTRITION DIAGNOSIS:   Increased nutrient needs related to post-op healing as evidenced by increased estimated needs.  GOAL:   Patient will meet greater than or equal to 90% of their needs  -not met   MONITOR:   PO intake, Supplement acceptance, Labs, Weight trends, Skin, I & O's   ASSESSMENT:   73 y.o. 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   Pt continues to have poor appetite and oral intake. Pt reports eating <50% of meals in hospital. Pt upset that her food comes cold. Pt is drinking some Ensure, she only likes the chocolate flavor. RD will increase Ensure to three times daily and add Magic Cups to meal trays. Recommend daily MVI. Pt on remeron, may benefit from Megace instead.   Medications reviewed and include: risaquad, azithromycin, heparin, insulin, remeron, protonix, KCl, prednisone, zosyn  Labs reviewed: K 3.0(L) Wbc- 14.3(H), Hgb 9.3(L), Hct 28.8(L) cbgs- 140, 141 x 24 hrs AIC- 6.5(H)- 11/21  Diet Order:   Diet Order            Diet regular Room service appropriate? Yes; Fluid consistency: Thin  Diet effective now             EDUCATION NEEDS:   No education needs have been identified at this time  Skin:  Skin Assessment: Reviewed RN Assessment(incision chest and abdomen)  Last BM:  11/24- type 7  Height:   Ht Readings from Last 1 Encounters:  08/18/19 _0  (1.651 m)    Weight:   Wt Readings from Last 1 Encounters:  08/18/19 70.9 kg    Ideal Body Weight:  56.8 kg  BMI:  Body mass index is 26.01 kg/m.  Estimated Nutritional Needs:   Kcal:   1500-1700kcal/day  Protein:  75-85g/day  Fluid:  >1.5L/day  Koleen Distance MS, RD, LDN Pager #- [phone] Office#- [phone] After Hours Pager: [phone]

## 2019-08-26 NOTE — Progress Notes (Signed)
Occupational Therapy Treatment Patient Details Name: Gloria Allen MRN: [id number] DOB: Jan 14, 1946 Today's Date: 08/26/2019    History of present illness Gloria Allen is a 47yoF who [facility] for removal of Right IJ portacath, ileostomy takedown, and repair of parastomal hernia. Code stroke called on 11/19 after acute onset slurring of speech, changes to fine motor coordination, hypotension, and HA. Stat head CT negative, neurology not suspecting any ischaemic process.   OT comments  Pt seen for OT tx this date to f/u re: [facility] concerns as it pertains to completion of fxl self care and ADL mobility. Pt demos good safety awareness for ADL mobility with Supv to MOD I level. Demos appropriate static standing balance required to complete grooming tasks. Pt demos MOD I with sit to/from stand to perform LB dressing with increased time required to manipulate and thread socks, but no physical assistance or cueing required. Pt overall demos appropriate fxl level to return to home environment. Pt does not appear to have further acute OT needs. Pt could benefit from outpt hand therapy f/u for minimal fine motor coordination concerns, but pt does also express concerns re: COVID precautions with seeking outpt therapy at this time. Pt safe to return home from OT standpoint, Will d/c at this time.   Follow Up Recommendations  Supervision - Intermittent(outpt hand therapy could be beneficial for [facility], but no fxl concerns from OT standpoint at this time.)    Equipment Recommendations  None recommended by OT    Recommendations for Other Services      Precautions / Restrictions Precautions Precautions: None Restrictions Weight Bearing Restrictions: No       Mobility Bed Mobility Overal bed mobility: Independent Bed Mobility: Sit to Supine     Supine to sit: Modified independent (Device/Increase time)     General bed mobility comments: very slight use of bed rail  Transfers Overall  transfer level: Independent Equipment used: None             General transfer comment: Demos appropriate speed and safety awareness with sit<>stand from EOB.    Balance Overall balance assessment: Modified Independent   Sitting balance-Leahy Scale: Normal     Standing balance support: No upper extremity supported;During functional activity Standing balance-Leahy Scale: Good                             ADL either performed or assessed with clinical judgement   ADL                                         General ADL Comments: Pt particiaptes in LB dressing with MOD I (with some increased time to manipulate sock d/t very minimal decreased R side FMC) with sit to/from stand from EOB. In addition, pt demos standing grooming with MOD I, using counter for stability intermittenly, but very light utilization of UEs for static standing balance, pt performs as Supv to MOD I level. No physical assist or setup provided. One verbal cue for safety with fxl mobility to not perform "furniture walking"     Vision Baseline Vision/History: Wears glasses(does not have glasses at hospital.) Patient Visual Report: No change from baseline     Perception     Praxis      Cognition Arousal/Alertness: Awake/alert Behavior During Therapy: WFL for tasks assessed/performed Overall Cognitive Status:  Within Functional Limits for tasks assessed                                          Exercises Other Exercises Other Exercises: OT facilitates education re: fall prevention strategies including avoiding "furniture walking" and controlling descent to sit. Pt verbalized appropraite understanding. Other Exercises: OT facilitates edication re: use of incentive spirometer to improve lung function-assessing for pt understanding of appropriate use. Pt demos good understanding.   Shoulder Instructions       General Comments      Pertinent Vitals/ Pain        Pain Assessment: No/denies pain  Home Living                                          Prior Functioning/Environment              Frequency  Min 1X/week        Progress Toward Goals  OT Goals(current goals can now be found in the care plan section)  Progress towards OT goals: Goals met/education completed, patient discharged from OT  Acute Rehab OT Goals Patient Stated Goal: regain strength per baseline OT Goal Formulation: All assessment and education complete, DC therapy  Plan Discharge plan needs to be updated    Co-evaluation                 AM-PAC OT "6 Clicks" Daily Activity     Outcome Measure   Help from another person eating meals?: None Help from another person taking care of personal grooming?: None Help from another person toileting, which includes using toliet, bedpan, or urinal?: None Help from another person bathing (including washing, rinsing, drying)?: A Little Help from another person to put on and taking off regular upper body clothing?: None Help from another person to put on and taking off regular lower body clothing?: None 6 Click Score: 23    End of Session Equipment Utilized During Treatment: Gait belt  OT Visit Diagnosis: Other abnormalities of gait and mobility (R26.89);Pain;Muscle weakness (generalized) (M62.81)   Activity Tolerance Patient tolerated treatment well   Patient Left in bed   Nurse Communication          Time: 6712-4580 OT Time Calculation (min): 15 min  Charges: OT General Charges $OT Visit: 1 Visit OT Treatments $Self Care/Home Management : 8-22 mins  Gerrianne Scale, MS, OTR/L ascom [phone] or [phone] 08/26/19, 3:13 PM

## 2019-08-26 NOTE — Progress Notes (Signed)
Date of Admission:  08/18/2019    11/17 Ileostomy reversal Received cefotetan as surgical prophylaxis  11/20 fever 11/20 started zosyn 11/22 Vanco 11/21 CT abdomen 11/22 CT chest 08/25/2019 CT abdomen  Subjective: Feeling better Fever go down No abdominal pain No diarrhea Medications:  . acidophilus  1 capsule Oral Daily  . amLODipine  10 mg Oral QHS  . feeding supplement (ENSURE ENLIVE)  237 mL Oral BID BM  . fluticasone furoate-vilanterol  1 puff Inhalation BH-q7a  . heparin  5,000 Units Subcutaneous Q8H  . hydrALAZINE  75 mg Oral Q6H  . insulin aspart  0-5 Units Subcutaneous QHS  . insulin aspart  0-9 Units Subcutaneous TID WC  . ipratropium-albuterol  3 mL Nebulization Q4H  . metoprolol succinate  100 mg Oral Daily  . mirtazapine  15 mg Oral QHS  . montelukast  10 mg Oral QHS  . [START ON 08/27/2019] multivitamin with minerals  1 tablet Oral Daily  . OLANZapine  10 mg Oral QHS  . pantoprazole  40 mg Oral QHS  . potassium chloride SA  20 mEq Oral Daily  . [START ON 08/27/2019] predniSONE  10 mg Oral Q breakfast  . predniSONE  20 mg Oral Q breakfast  . pregabalin  75 mg Oral BID  . sertraline  200 mg Oral QHS  . terazosin  1 mg Oral QHS    Objective: Vital signs in last 24 hours: Temp:  [98.7 F (37.1 C)-100.7 F (38.2 C)] 99.7 F (37.6 C) (11/25 1748) Pulse Rate:  [88-115] 92 (11/25 1319) Resp:  [18] 18 (11/25 1046) BP: (152-205)/(56-65) 171/62 (11/25 1319) SpO2:  [93 %-99 %] 96 % (11/25 1953)  PHYSICAL EXAM:  General: Alert, cooperative, no distress, sitting in chair Lungs bilateral air entry Heart S1-S2 heart: Regular rate and rhythm, no murmur, rub or gallop. Abdomen: Surgical site has dark brown bloody discharge Surgical site has induration Extremities: atraumatic, no cyanosis. No edema. No clubbing Skin: No rashes or lesions. Or bruising Lymph: Cervical, supraclavicular normal. Neurologic: Grossly non-focal  Lab Results Recent Labs   08/24/19 0420 08/25/19 0743 08/26/19 0730  WBC 11.7* 12.0* 14.3*  HGB 8.9* 9.0* 9.3*  HCT 26.4* 28.2* 28.8*  NA 140  --  142  K 3.5  --  3.0*  CL 105  --  105  CO2 23  --  24  BUN 7*  --  8  CREATININE 0.73  --  0.87   Liver Panel Recent Labs    08/26/19 0730  PROT 6.7  ALBUMIN 2.6*  AST 22  ALT 29  ALKPHOS 132*  BILITOT 0.7   Sedimentation Rate No results for input(s): ESRSEDRATE in the last 72 hours. C-Reactive Protein No results for input(s): CRP in the last 72 hours.  Microbiology:  Studies/Results: Dg Chest 1 View  Result Date: 08/25/2019 CLINICAL DATA:  Pneumonia. EXAM: CHEST  1 VIEW COMPARISON:  CTA of the chest 08/23/2019 FINDINGS: Heart is enlarged. There is no edema. Asymmetric right-sided airspace disease is again noted. Left lung is clear. Small right pleural effusion is again noted. IMPRESSION: 1. Stable right-sided airspace disease compatible with pneumonia. 2. Stable small right pleural effusion. 3. Stable cardiomegaly without failure. Electronically Signed   By: San Morelle M.D.   On: 08/25/2019 11:20   Ct Abdomen Pelvis W Contrast  Result Date: 08/25/2019 CLINICAL DATA:  Abdominal pain and fever, history of recent ileostomy reversal. EXAM: CT ABDOMEN AND PELVIS WITH CONTRAST TECHNIQUE: Multidetector CT imaging of the abdomen  and pelvis was performed using the standard protocol following bolus administration of intravenous contrast. CONTRAST:  151mL OMNIPAQUE IOHEXOL 300 MG/ML  SOLN COMPARISON:  08/22/2019 FINDINGS: Lower chest: Small right pleural effusion is now seen new from the prior exam. Hepatobiliary: Fatty infiltration of the liver is noted. The gallbladder has been surgically removed. A small cyst is noted within the right lobe of the liver inferiorly. Pancreas: Unremarkable. No pancreatic ductal dilatation or surrounding inflammatory changes. Spleen: Normal in size without focal abnormality. Adrenals/Urinary Tract: Adrenal glands are stable  in appearance. Kidneys demonstrate a normal enhancement pattern similar to that seen on the prior exam. Delayed images demonstrate normal excretion of contrast from both kidneys. A small less than 1 cm left renal cyst is seen. The bladder is well distended. Stomach/Bowel: The rectum is noted. There is a low anastomosis to the rectum identified with multiple edematous loops of colon proximal to this although no obstructive changes are seen. The terminal ileum is within normal limits. Previously seen fluid collection adjacent to the cecal tip is again identified best seen on image number 56 of series 2. This measures approximately 4.0 x 1.5 cm and is stable in appearance from the prior exam. Mild enhancement is noted and again may represent a small postoperative hematoma or complex seroma. The degree of gas locules in the collection have decreased in the interval from the prior exam. Ileal anastomosis is again identified without evidence of extravasation. The postsurgical changes in the anterior abdominal wall are stable. Fluid and air is noted. More proximal small bowel is within normal limits. Vascular/Lymphatic: Aortic atherosclerosis. No enlarged abdominal or pelvic lymph nodes. Reproductive: Status post hysterectomy. No adnexal masses. Other: No abdominal wall hernia or abnormality. No abdominopelvic ascites. Musculoskeletal: Degenerative changes of lumbar spine are seen and stable from the prior exam. IMPRESSION: New right-sided pleural effusion. Stable mildly peripherally enhancing fluid collection in the right lower quadrant adjacent to the cecal tip. This again likely represents a small postoperative hematoma or mildly complex seroma. The need for continued follow-up can be determined on a clinical basis. Changes consistent with the recent ileostomy reversal in the right anterior abdominal wall. Edematous changes are noted within the abdomen related to the recent surgery stable in appearance from the prior  exam. No new focal abnormality is noted. Electronically Signed   By: Inez Catalina M.D.   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  Curve coming down.  Differential diagnosis SSI versus right-sided pneumonia versus noninfectious cause like DVT Repeat CT done yesterday did not show any increase in any collection at the surgical site. C. difficile negative Blood cultures negative Urine is fine Patient has been on Zosyn since 11/20 and vancomycin since 11/21 we will discontinue both antibiotics and observe  Right lung has groundglass opacity/infiltrate.  Initially there was concern for aspiration pneumonia.  She has had radiation to the right lung recently.  Pulmonary seen patient and he thinks it could be radiation pneumonitis.    Anemia  Small cell lung cancer- S/P radiation and 4 cycles of chemo 05/19/19  Discussed the management with the patient and care team

## 2019-08-27 LAB — CBC
HCT: 28.1 % — ABNORMAL LOW (ref 36.0–46.0)
Hemoglobin: 9.1 g/dL — ABNORMAL LOW (ref 12.0–15.0)
MCH: 28.5 pg (ref 26.0–34.0)
MCHC: 32.4 g/dL (ref 30.0–36.0)
MCV: 88.1 fL (ref 80.0–100.0)
Platelets: 389 10*3/uL (ref 150–400)
RBC: 3.19 MIL/uL — ABNORMAL LOW (ref 3.87–5.11)
RDW: 14.3 % (ref 11.5–15.5)
WBC: 16.8 10*3/uL — ABNORMAL HIGH (ref 4.0–10.5)
nRBC: 0 % (ref 0.0–0.2)

## 2019-08-27 LAB — GLUCOSE, CAPILLARY
Glucose-Capillary: 119 mg/dL — ABNORMAL HIGH (ref 70–99)
Glucose-Capillary: 289 mg/dL — ABNORMAL HIGH (ref 70–99)
Glucose-Capillary: 142 mg/dL — ABNORMAL HIGH (ref 70–99)
Glucose-Capillary: 160 mg/dL — ABNORMAL HIGH (ref 70–99)

## 2019-08-27 LAB — BASIC METABOLIC PANEL
Anion gap: 14 (ref 5–15)
BUN: 7 mg/dL — ABNORMAL LOW (ref 8–23)
CO2: 24 mmol/L (ref 22–32)
Calcium: 8.5 mg/dL — ABNORMAL LOW (ref 8.9–10.3)
Chloride: 106 mmol/L (ref 98–111)
Creatinine, Ser: 0.74 mg/dL (ref 0.44–1.00)
GFR calc Af Amer: >60 mL/min (ref >60)
GFR calc non Af Amer: >60 mL/min (ref >60)
Glucose, Bld: 156 mg/dL — ABNORMAL HIGH (ref 70–99)
Potassium: 2.7 mmol/L — CL (ref 3.5–5.1)
Sodium: 144 mmol/L (ref 135–145)

## 2019-08-27 LAB — MAGNESIUM: Magnesium: 1.9 mg/dL (ref 1.7–2.4)

## 2019-08-27 LAB — POTASSIUM: Potassium: 4.2 mmol/L (ref 3.5–5.1)

## 2019-08-27 MED ORDER — IPRATROPIUM-ALBUTEROL 0.5-2.5 (3) MG/3ML IN SOLN
3.0000 mL | Freq: Four times a day (QID) | RESPIRATORY_TRACT | Status: DC
  Administered 2019-08-27 – 2019-08-28 (×5): 3 mL via RESPIRATORY_TRACT
  Filled 2019-08-27 (×5): qty 3

## 2019-08-27 MED ORDER — POTASSIUM CHLORIDE CRYS ER 20 MEQ PO TBCR
40.0000 meq | EXTENDED_RELEASE_TABLET | ORAL | Status: AC
  Administered 2019-08-27 (×2): 40 meq via ORAL
  Filled 2019-08-27 (×2): qty 2

## 2019-08-27 NOTE — Consult Note (Signed)
PHARMACY CONSULT NOTE - FOLLOW UP  Pharmacy Consult for Electrolyte Monitoring and Replacement   Recent Labs: Potassium (mmol/L)  Date Value  08/27/2019 4.2   Magnesium (mg/dL)  Date Value  08/27/2019 1.9   Calcium (mg/dL)  Date Value  08/27/2019 8.5 (L)   Albumin (g/dL)  Date Value  08/26/2019 2.6 (L)  08/21/2018 4.2   Phosphorus (mg/dL)  Date Value  08/23/2019 2.8   Sodium (mmol/L)  Date Value  08/27/2019 144  08/21/2018 141   Corrected Ca: 9.6 mg/dL  Assessment: 73 yr old woman who is8days s/p surgery for ileostomy reversal following a low- anterior resection and loop ileostomy for rectosigmoid carcinoma in February of this year.  Goal of Therapy:  Electrolytes WNL  Plan:   Potassium replaced with a total of 80 mEq oral KCl by Dr Wyonia Hough  She remains on oral KCl 20 mEq daily: continue  Potassium at 1800 - returned 4.2 - no additional replenishment needed this evening  BMP, magnesium in am  Lu Duffel ,PharmD Clinical Pharmacist 08/27/2019 6:48 PM

## 2019-08-27 NOTE — Progress Notes (Signed)
MD notified.Patient potassium is 2.7.

## 2019-08-27 NOTE — Progress Notes (Signed)
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 The hospitalist service was consulted out of concern for possible aspiration pneumonia resulting in fevers and dyspnea by the patient on 08/22/2019.  She has a past medical history is significant for hypertension, colon cancer, chronic pain, DDD of cervical and lumbar vertebral bodies, small cell lung CA depression, entrapment syndrome, GERD, headaches, chronic itching and UTI.  Abdominal film performed on 08/21/2019 has demonstrated new ground-glass opacity and consolidation in the right perihilar lung concerning for pneumonia. There is also a small focus of opacity in the left peripheral mid lung thought to correspond to a pulmonary nodule noted on prior CT. She has also been febrile and dyspneic with a non-productive cough. She is receiving IV Zosyn and breathing treatments.   Assessment & Plan:   Principal Problem:   Aspiration pneumonia of right lung (HCC) Active Problems:   GERD (gastroesophageal reflux disease)   SOB (shortness of breath)   Malignant neoplasm of colon (HCC)   Small cell lung cancer ([place])   Essential hypertension   S/P closure of ileostomy   Aspiration PNA:  -Antibiotics discontinued -ID following -Fever curve coming down and felt to be tumor related  Small cell lung cancer/ colon cancer:  -Pt is followed closely by her oncologist . -Directed by oncology for outpatient treatment  GERD: -PPI therapy continued.  HTN:  -Meds reviewed.  -Continue amlodipine, hydralazine, Toprol.  DVT prophylaxis: Heparin SQ  Code Status: Full code    Code Status Orders  (From admission, onward)         Start     Ordered   08/18/19 1507   Full code  Continuous     08/18/19 1506        Code Status History    Date Active Date Inactive Code Status Order ID Comments User Context   03/25/2019 0221 03/27/2019 2012 Full Code [id number]  Mayer Camel, NP ED   11/28/2018 1137 12/03/2018 1801 DNR [id number]  Demetrios Loll, MD Inpatient   11/27/2018 2029 11/28/2018 1137 Full Code [id number]  Jules Husbands, MD Inpatient   11/21/2018 1945 11/22/2018 0017 Full Code [id number]  Corky Mull, MD ED   03/26/2017 1223 03/27/2017 1740 Full Code [id number]  Thornton Park, MD Inpatient   Advance Care Planning Activity    Advance Directive Documentation     Most Recent Value  Type of Advance Directive  Out of facility DNR (pink MOST or yellow form)  Pre-existing out of facility DNR order (yellow form or pink MOST form)  Physician notified to receive inpatient order  "MOST" Form in Place?  -     Family Communication: Discussed in detail with patient, patient is listed as a confidential patient Disposition Plan:   Per primary team Consults called: [place] Admission status: Inpatient   Consultants:   TRH  Procedures:  Dg Chest 1 View  Result Date: 08/25/2019 CLINICAL DATA:  Pneumonia. EXAM: CHEST  1 VIEW COMPARISON:  CTA of the chest 08/23/2019 FINDINGS: Heart is enlarged. There is no edema. Asymmetric right-sided airspace disease is again noted. Left lung is clear. Small right pleural effusion is again noted. IMPRESSION: 1. Stable right-sided airspace disease compatible with pneumonia. 2. Stable small right  pleural effusion. 3. Stable cardiomegaly without failure. Electronically Signed   By: San Morelle M.D.   On: 08/25/2019 11:20   Dg Lumbar Spine Complete W/bend  Result Date: 07/30/2019 CLINICAL DATA:  Low back pain for several years, no known injury, initial encounter EXAM: LUMBAR SPINE - COMPLETE WITH BENDING VIEWS COMPARISON:  01/31/2017 FINDINGS: Five lumbar type vertebral bodies are well visualized. Osteophytic changes are noted.  Facet hypertrophic changes are noted as well. No pars defects are seen. Degenerative anterolisthesis of L4 on L5 is seen stable in appearance from the prior exam. Flexion and extension views show no significant instability. Postsurgical changes in the pelvis are noted. IMPRESSION: Multilevel degenerative change with anterolisthesis of L4 on L5 without significant instability. Electronically Signed   By: Inez Catalina M.D.   On: 07/30/2019 00:35   Dg Si Joints  Result Date: 07/29/2019 CLINICAL DATA:  Low back pain since retiring in 2002, no known injury, history degenerative disc disease EXAM: BILATERAL SACROILIAC JOINTS - 3+ VIEW COMPARISON:  07/05/2017 FINDINGS: Osseous demineralization. Hip and SI joint spaces preserved. Degenerative disc and facet disease changes at visualized lower lumbar spine. No fracture or bone destruction. No radiographic evidence of sacroiliitis. IMPRESSION: Osseous demineralization with degenerative disc and facet disease changes of lower lumbar spine. No acute SI joint abnormalities. Electronically Signed   By: Lavonia Dana M.D.   On: 07/29/2019 18:07   Ct Angio Chest Pe W Or Wo Contrast  Result Date: 08/23/2019 CLINICAL DATA:  Shortness of breath EXAM: CT ANGIOGRAPHY CHEST WITH CONTRAST TECHNIQUE: Multidetector CT imaging of the chest was performed using the standard protocol during bolus administration of intravenous contrast. Multiplanar CT image reconstructions and MIPs were obtained to evaluate the vascular anatomy. CONTRAST:  53mL OMNIPAQUE IOHEXOL 350 MG/ML SOLN COMPARISON:  06/05/2019 FINDINGS: Cardiovascular: Contrast injection is sufficient to demonstrate satisfactory opacification of the pulmonary arteries to the segmental level. There is no pulmonary embolus. The main pulmonary artery is dilated measuring approximately 3.5 cm. There is no CT evidence of acute right heart strain. The visualized aorta is normal. Heart size is normal, without pericardial effusion.  Mediastinum/Nodes: --No mediastinal or hilar lymphadenopathy. --No axillary lymphadenopathy. --No supraclavicular lymphadenopathy. --Normal thyroid gland. --The esophagus is unremarkable Lungs/Pleura: There confluent ground-glass airspace opacities within the right upper lobe and to a lesser extent right middle and right lower lobes. There is fluid along the major and minor fissures. There is some smooth interlobular septal thickening involving the right upper lobe. There are a few peripheral subcentimeter pulmonary nodules in the right upper lobe. There is a relatively stable pulmonary opacity in the left upper lobe measuring approximately 1.8 cm (previously measuring the same). There is a small right-sided pleural effusion with adjacent atelectasis. Upper Abdomen: No acute abnormality. Musculoskeletal: No chest wall abnormality. No acute or significant osseous findings. There is some subcutaneous fat stranding involving the right chest wall. The this is favored to represent sequela of a recently removed right-sided Port-A-Cath. Review of the MIP images confirms the above findings. IMPRESSION: 1. Ground-glass airspace opacities and consolidation throughout the right lung field is concerning for multifocal pneumonia or aspiration in the appropriate clinical setting. 2. Small right-sided parapneumonic effusion. 3. Stable pulmonary opacity in the left upper lobe currently measuring 1.8 cm. 4. No acute pulmonary embolism. The main pulmonary artery is dilated which can be seen in patients with elevated pulmonary artery pressures. Electronically Signed   By: Constance Holster M.D.   On: 08/23/2019 17:51   Ct Abdomen  Pelvis W Contrast  Result Date: 08/25/2019 CLINICAL DATA:  Abdominal pain and fever, history of recent ileostomy reversal. EXAM: CT ABDOMEN AND PELVIS WITH CONTRAST TECHNIQUE: Multidetector CT imaging of the abdomen and pelvis was performed using the standard protocol following bolus administration of  intravenous contrast. CONTRAST:  126mL OMNIPAQUE IOHEXOL 300 MG/ML  SOLN COMPARISON:  08/22/2019 FINDINGS: Lower chest: Small right pleural effusion is now seen new from the prior exam. Hepatobiliary: Fatty infiltration of the liver is noted. The gallbladder has been surgically removed. A small cyst is noted within the right lobe of the liver inferiorly. Pancreas: Unremarkable. No pancreatic ductal dilatation or surrounding inflammatory changes. Spleen: Normal in size without focal abnormality. Adrenals/Urinary Tract: Adrenal glands are stable in appearance. Kidneys demonstrate a normal enhancement pattern similar to that seen on the prior exam. Delayed images demonstrate normal excretion of contrast from both kidneys. A small less than 1 cm left renal cyst is seen. The bladder is well distended. Stomach/Bowel: The rectum is noted. There is a low anastomosis to the rectum identified with multiple edematous loops of colon proximal to this although no obstructive changes are seen. The terminal ileum is within normal limits. Previously seen fluid collection adjacent to the cecal tip is again identified best seen on image number 56 of series 2. This measures approximately 4.0 x 1.5 cm and is stable in appearance from the prior exam. Mild enhancement is noted and again may represent a small postoperative hematoma or complex seroma. The degree of gas locules in the collection have decreased in the interval from the prior exam. Ileal anastomosis is again identified without evidence of extravasation. The postsurgical changes in the anterior abdominal wall are stable. Fluid and air is noted. More proximal small bowel is within normal limits. Vascular/Lymphatic: Aortic atherosclerosis. No enlarged abdominal or pelvic lymph nodes. Reproductive: Status post hysterectomy. No adnexal masses. Other: No abdominal wall hernia or abnormality. No abdominopelvic ascites. Musculoskeletal: Degenerative changes of lumbar spine are seen  and stable from the prior exam. IMPRESSION: New right-sided pleural effusion. Stable mildly peripherally enhancing fluid collection in the right lower quadrant adjacent to the cecal tip. This again likely represents a small postoperative hematoma or mildly complex seroma. The need for continued follow-up can be determined on a clinical basis. Changes consistent with the recent ileostomy reversal in the right anterior abdominal wall. Edematous changes are noted within the abdomen related to the recent surgery stable in appearance from the prior exam. No new focal abnormality is noted. Electronically Signed   By: Inez Catalina M.D.   On: 08/25/2019 19:49   Ct Abdomen Pelvis W Contrast  Result Date: 08/22/2019 CLINICAL DATA:  Fever. Worsening abdominal pain after ileostomy reversal 4 days ago. EXAM: CT ABDOMEN AND PELVIS WITH CONTRAST TECHNIQUE: Multidetector CT imaging of the abdomen and pelvis was performed using the standard protocol following bolus administration of intravenous contrast. CONTRAST:  169mL OMNIPAQUE IOHEXOL 300 MG/ML  SOLN COMPARISON:  06/05/2019 FINDINGS: Lower chest: Patchy ground-glass attenuation noted central right lower lobe. Small right pleural effusion is associated with right base atelectasis. Hepatobiliary: Stable tiny cyst inferior right hepatic lobe. Gallbladder surgically absent. No intrahepatic or extrahepatic biliary dilation. Pancreas: No focal mass lesion. No dilatation of the main duct. No intraparenchymal cyst. No peripancreatic edema. Spleen: No splenomegaly. No focal mass lesion. Adrenals/Urinary Tract: No adrenal nodule or mass. Tiny calcifications in the hilum of the left kidney are vascular, best appreciated on delayed imaging. 5 mm low-density lesion upper pole left kidney  is too small to characterize but likely benign. Right kidney unremarkable. No evidence for hydroureter. The urinary bladder appears normal for the degree of distention. Stomach/Bowel: Stomach is  unremarkable. No gastric wall thickening. No evidence of outlet obstruction. Duodenum is normally positioned as is the ligament of Treitz. No small bowel wall thickening. No small bowel dilatation. Status post ileal anastomosis right lower quadrant in the region of the distal ileum. Terminal ileum unremarkable. Oral contrast material has migrated through the ileal anastomosis and terminal ileum and is just starting to opacified the cecum. No evidence for contrast extravasation at the ileal anastomosis. The appendix is not visualized, but there is no edema or inflammation in the region of the cecum. Status post left hemicolectomy with rectal anastomosis. Vascular/Lymphatic: There is abdominal aortic atherosclerosis without aneurysm. There is no gastrohepatic or hepatoduodenal ligament lymphadenopathy. No intraperitoneal or retroperitoneal lymphadenopathy. No pelvic sidewall lymphadenopathy. Reproductive: The uterus is surgically absent. There is no adnexal mass. Other: Small collection of fluid and gas identified right lower anterior abdominal wall near the location of the ileostomy take down. This is not unexpected on postoperative day 4 and may reflect a tiny residual seroma or hematoma. There is some fluid in the anterior peritoneal cavity of the right lower quadrant around the small bowel anastomosis and cecal tip (see image 61 of series 2). This fluid has attenuation higher than would be expected for serous fluid. Several small gas locules are seen within this fluid and there is edema/stranding in the ileocolic mesentery Musculoskeletal: Gas locules in the subcutaneous fat of the left anterior abdominal wall are likely injection sites. No worrisome lytic or sclerotic osseous abnormality. IMPRESSION: 1. Small fluid collection anterior right lower quadrant around the distal ileal anastomosis and cecal tip. Attenuation of the fluid is higher than would be expected for serous fluid and several small gas bubbles are  evident within the fluid. There may be some overlying peritoneal enhancement in this region. Postoperative day 4 would be earlier than expected for abscess and features likely reflect some residual small hematoma/blood products. Extraluminal gas is not unexpected on postoperative day 4. 2. Subcutaneous gas in small subcutaneous fluid collection in the right anterior abdominal wall at the location of ileostomy takedown. This is probably a tiny residual seroma, and again, extraluminal gas is not unexpected 4 days after surgery. 3. No evidence for bowel obstruction or anastomotic leak. The oral contrast has migrated through the ileal anastomosis into the terminal ileum and cecum. Contrast has not yet reached the distal colon although there is no evidence for fluid around the rectal anastomosis. 4. Edema/stranding in the ileocolic mesentery is presumably sequelae of recent surgery. 5. Trace right lower lobe collapse/consolidation with small right pleural effusion. 6.  Aortic Atherosclerois (ICD10-170.0) Electronically Signed   By: Misty Stanley M.D.   On: 08/22/2019 13:14   US Venous Img Lower Bilateral (dvt)  Result Date: 08/22/2019 CLINICAL DATA:  Dyspnea.  Inpatient. EXAM: BILATERAL LOWER EXTREMITY VENOUS DOPPLER ULTRASOUND TECHNIQUE: Gray-scale sonography with graded compression, as well as color Doppler and duplex ultrasound were performed to evaluate the lower extremity deep venous systems from the level of the common femoral vein and including the common femoral, femoral, profunda femoral, popliteal and calf veins including the posterior tibial, peroneal and gastrocnemius veins when visible. The superficial great saphenous vein was also interrogated. Spectral Doppler was utilized to evaluate flow at rest and with distal augmentation maneuvers in the common femoral, femoral and popliteal veins. COMPARISON:  None. FINDINGS: RIGHT  LOWER EXTREMITY Common Femoral Vein: No evidence of thrombus. Normal  compressibility, respiratory phasicity and response to augmentation. Saphenofemoral Junction: No evidence of thrombus. Normal compressibility and flow on color Doppler imaging. Profunda Femoral Vein: No evidence of thrombus. Normal compressibility and flow on color Doppler imaging. Femoral Vein: No evidence of thrombus. Normal compressibility, respiratory phasicity and response to augmentation. Popliteal Vein: No evidence of thrombus. Normal compressibility, respiratory phasicity and response to augmentation. Calf Veins: No evidence of thrombus. Normal compressibility and flow on color Doppler imaging. Superficial Great Saphenous Vein: No evidence of thrombus. Normal compressibility. Venous Reflux:  None. Other Findings:  None. LEFT LOWER EXTREMITY Common Femoral Vein: No evidence of thrombus. Normal compressibility, respiratory phasicity and response to augmentation. Saphenofemoral Junction: No evidence of thrombus. Normal compressibility and flow on color Doppler imaging. Profunda Femoral Vein: No evidence of thrombus. Normal compressibility and flow on color Doppler imaging. Femoral Vein: No evidence of thrombus. Normal compressibility, respiratory phasicity and response to augmentation. Popliteal Vein: No evidence of thrombus. Normal compressibility, respiratory phasicity and response to augmentation. Calf Veins: No evidence of thrombus. Normal compressibility and flow on color Doppler imaging. Superficial Great Saphenous Vein: No evidence of thrombus. Normal compressibility. Venous Reflux:  None. Other Findings:  None. IMPRESSION: No evidence of deep venous thrombosis in either lower extremity. Electronically Signed   By: Ilona Sorrel M.D.   On: 08/22/2019 18:33   Dg Abd Acute 2+v W 1v Chest  Result Date: 08/21/2019 CLINICAL DATA:  Fever, generalized weakness EXAM: DG ABDOMEN ACUTE W/ 1V CHEST COMPARISON:  04/28/2019, CT 06/05/2019 FINDINGS: Single-view chest demonstrates new ground-glass opacity and  consolidation in the right perihilar lung. Smaller focus of airspace disease in the left mid to lower lung, likely corresponding to pulmonary lung nodule. Mild cardiomegaly. No pneumothorax. Postsurgical changes in the right humeral head. Supine and upright views of the abdomen demonstrate no free air beneath the diaphragm. Postsurgical changes in the pelvis. Cutaneous staples over the right lower quadrant. Air distended probable large bowel with fluid levels in the right lower quadrant, no definite dilated small bowel. IMPRESSION: 1. New ground-glass opacity and consolidation in the right perihilar lung concerning for pneumonia. Radiographic follow-up to resolution recommended. Small focus of opacity in the left peripheral mid lung thought to correspond to a pulmonary nodule noted on prior CT 2. Mild air distended right lower quadrant colon, though without small bowel distention to suggest obstruction. These results will be called to the ordering clinician or representative by the Radiologist Assistant, and communication documented in the PACS or zVision Dashboard. Electronically Signed   By: Donavan Foil M.D.   On: 08/21/2019 21:02   Ct Head Code Stroke Wo Contrast`  Result Date: 08/20/2019 CLINICAL DATA:  Code stroke. Right upper extremity weakness developing of the last 90 minutes. Possible slurred speech. EXAM: CT HEAD WITHOUT CONTRAST TECHNIQUE: Contiguous axial images were obtained from the base of the skull through the vertex without intravenous contrast. COMPARISON:  02/26/2019.  04/07/2016. FINDINGS: Brain: Chronic small-vessel ischemic changes affect the cerebral hemispheric white matter. No sign of acute infarction, mass lesion, hemorrhage, hydrocephalus or extra-axial collection. Vascular: No abnormal vascular finding.  No hyperdense vessel. Skull: Negative Sinuses/Orbits: Clear/normal Other: None ASPECTS ([place] Stroke Program Early CT Score) - Ganglionic level infarction (caudate, lentiform  nuclei, internal capsule, insula, M1-M3 cortex): 7 - Supraganglionic infarction (M4-M6 cortex): 3 Total score (0-10 with 10 being normal): 10 IMPRESSION: 1. No acute finding. Chronic small-vessel ischemic changes of the white matter. 2. ASPECTS  is 10 3. Call report in progress. Electronically Signed   By: Nelson Chimes M.D.   On: 08/20/2019 15:38     Antimicrobials:   ZOSYN 11/20-11/25  VANC 11/21-11/25   Subjective: Patient without complaint reports she feels fine   Objective: Vitals:   08/26/19 2335 08/27/19 0530 08/27/19 0626 08/27/19 0724  BP: (!) 151/50 (!) 187/70 (!) 157/48   Pulse: 77 (!) 106 98   Resp:  20    Temp:  98.2 F (36.8 C)    TempSrc:  Oral    SpO2:  96% 96% 93%  Weight:      Height:        Intake/Output Summary (Last 24 hours) at 08/27/2019 1057 Last data filed at 08/26/2019 2110 Gross per 24 hour  Intake 120 ml  Output -  Net 120 ml   Filed Weights   08/18/19 0931  Weight: 70.9 kg    Examination:  General exam: Appears calm and comfortable  Respiratory system: Clear to auscultation. Respiratory effort normal. Cardiovascular system: S1 & S2 heard, RRR. No JVD, murmurs, rubs, gallops or clicks. No pedal edema. Gastrointestinal system: Abdomen is nondistended, soft and nontender. No organomegaly or masses felt. Normal bowel sounds heard. Central nervous system: Alert and oriented. No focal neurological deficits. Extremities: Symmetric 5 x 5 power. Skin: No rashes, lesions or ulcers Psychiatry: Judgement and insight appear normal. Mood & affect appropriate.     Data Reviewed: I have personally reviewed following labs and imaging studies  CBC: Recent Labs  Lab 08/23/19 0612 08/24/19 0420 08/25/19 0743 08/26/19 0730 08/27/19 0916  WBC 12.7* 11.7* 12.0* 14.3* 16.8*  NEUTROABS 10.6*  --   --   --   --   HGB 9.3* 8.9* 9.0* 9.3* 9.1*  HCT 28.5* 26.4* 28.2* 28.8* 28.1*  MCV 85.1 84.9 88.4 88.3 88.1  PLT 227 253 307 362 211   Basic  Metabolic Panel: Recent Labs  Lab 08/21/19 0635 08/22/19 0559 08/23/19 0612 08/24/19 0420 08/26/19 0730 08/27/19 0916  NA 140 140 138 140 142 144  K 3.7 3.4* 3.3* 3.5 3.0* 2.7*  CL 110 109 104 105 105 106  CO2 20* 21* 21* 23 24 24   GLUCOSE 148* 143* 137* 140* 141* 156*  BUN 10 8 8  7* 8 7*  CREATININE 0.66 0.64 0.72 0.73 0.87 0.74  CALCIUM 8.5* 8.4* 8.6* 8.6* 8.4* 8.5*  MG 1.7  --  1.7  --   --  1.9  PHOS 2.2*  --  2.8  --   --   --    GFR: Estimated Creatinine Clearance: 63.7 mL/min (by C-G formula based on SCr of 0.74 mg/dL). Liver Function Tests: Recent Labs  Lab 08/21/19 0635 08/22/19 0559 08/23/19 0612 08/26/19 0730  AST 24 23 21 22   ALT 16 19 21 29   ALKPHOS 85 107 125 132*  BILITOT 0.5 0.6 0.6 0.7  PROT 6.4* 6.3* 6.3* 6.7  ALBUMIN 2.9* 2.8* 2.7* 2.6*   No results for input(s): LIPASE, AMYLASE in the last 168 hours. No results for input(s): AMMONIA in the last 168 hours. Coagulation Profile: No results for input(s): INR, PROTIME in the last 168 hours. Cardiac Enzymes: No results for input(s): CKTOTAL, CKMB, CKMBINDEX, TROPONINI in the last 168 hours. BNP (last 3 results) No results for input(s): PROBNP in the last 8760 hours. HbA1C: No results for input(s): HGBA1C in the last 72 hours. CBG: Recent Labs  Lab 08/20/19 1517 08/26/19 1331 08/26/19 1713 08/26/19 2115 08/27/19 0744  GLUCAP  245* 249* 168* 213* 119*   Lipid Profile: No results for input(s): CHOL, HDL, LDLCALC, TRIG, CHOLHDL, LDLDIRECT in the last 72 hours. Thyroid Function Tests: No results for input(s): TSH, T4TOTAL, FREET4, T3FREE, THYROIDAB in the last 72 hours. Anemia Panel: No results for input(s): VITAMINB12, FOLATE, FERRITIN, TIBC, IRON, RETICCTPCT in the last 72 hours. Sepsis Labs: Recent Labs  Lab 08/24/19 2015  PROCALCITON 0.24    Recent Results (from the past 240 hour(s))  CULTURE, BLOOD (ROUTINE X 2) w Reflex to ID Panel     Status: None   Collection Time: 08/21/19  5:45  PM   Specimen: BLOOD  Result Value Ref Range Status   Specimen Description BLOOD RT [facility]  Final   Special Requests   Final    BOTTLES DRAWN AEROBIC AND ANAEROBIC Blood Culture adequate volume   Culture   Final    NO GROWTH 5 DAYS Performed at [facility], [address] [place], [place] [address]    Report Status 08/26/2019 FINAL  Final  CULTURE, BLOOD (ROUTINE X 2) w Reflex to ID Panel     Status: None   Collection Time: 08/21/19  5:48 PM   Specimen: BLOOD  Result Value Ref Range Status   Specimen Description BLOOD R HAND  Final   Special Requests   Final    BOTTLES DRAWN AEROBIC AND ANAEROBIC Blood Culture adequate volume   Culture   Final    NO GROWTH 5 DAYS Performed at [facility], [address] [place], [place] [address]    Report Status 08/26/2019 FINAL  Final  SARS CORONAVIRUS 2 (TAT 6-24 HRS) Nasopharyngeal Nasopharyngeal Swab     Status: None   Collection Time: 08/21/19  6:30 PM   Specimen: Nasopharyngeal Swab  Result Value Ref Range Status   SARS Coronavirus 2 NEGATIVE NEGATIVE Final    Comment: (NOTE) SARS-CoV-2 target nucleic acids are NOT DETECTED. The SARS-CoV-2 RNA is generally detectable in upper and lower respiratory specimens during the acute phase of infection. Negative results do not preclude SARS-CoV-2 infection, do not rule out co-infections with other pathogens, and should not be used as the sole basis for treatment or other patient management decisions. Negative results must be combined with clinical observations, patient history, and epidemiological information. The expected result is Negative. Fact Sheet for Patients: SugarRoll.be Fact Sheet for Healthcare Providers: https://www.woods-mathews.com/ This test is not yet approved or cleared by the [place] FDA and  has been authorized for detection and/or diagnosis of SARS-CoV-2 by FDA under an Emergency Use Authorization (EUA).  This EUA will remain  in effect (meaning this test can be used) for the duration of the COVID-19 declaration under Section 56 4(b)(1) of the Act, 21 U.S.C. section 360bbb-3(b)(1), unless the authorization is terminated or revoked sooner. Performed at [facility], [place] [address], [place], [place] [address]   MRSA PCR Screening     Status: None   Collection Time: 08/23/19  3:28 PM   Specimen: Nasal Mucosa; Nasopharyngeal  Result Value Ref Range Status   MRSA by PCR NEGATIVE NEGATIVE Final    Comment:        The GeneXpert MRSA Assay (FDA approved for NASAL specimens only), is one component of a comprehensive MRSA colonization surveillance program. It is not intended to diagnose MRSA infection nor to guide or monitor treatment for MRSA infections. Performed at [facility], [address], [place], [place] [address]   Culture, Urine     Status:  None   Collection Time: 08/24/19  6:42 AM   Specimen: Urine, Random  Result Value Ref Range Status   Specimen Description   Final    URINE, RANDOM Performed at [facility], [address] [place], [place] [address]    Special Requests   Final    NONE Performed at [facility], [address], [place], [place] [address]    Culture   Final    NO GROWTH Performed at [facility], [place] [address] [place], [place] [address]    Report Status 08/25/2019 FINAL  Final  CULTURE, BLOOD (ROUTINE X 2) w Reflex to ID Panel     Status: None (Preliminary result)   Collection Time: 08/25/19  3:40 PM   Specimen: BLOOD  Result Value Ref Range Status   Specimen Description BLOOD RIGHT ANTECUBITAL  Final   Special Requests   Final    BOTTLES DRAWN AEROBIC AND ANAEROBIC Blood Culture adequate volume   Culture   Final    NO GROWTH 2 DAYS Performed at [facility], [address] [place], [place] [address]    Report Status PENDING  Incomplete  CULTURE, BLOOD (ROUTINE X 2) w Reflex to ID Panel      Status: None (Preliminary result)   Collection Time: 08/25/19  3:47 PM   Specimen: BLOOD  Result Value Ref Range Status   Specimen Description BLOOD BLOOD RIGHT WRIST  Final   Special Requests   Final    BOTTLES DRAWN AEROBIC AND ANAEROBIC Blood Culture adequate volume   Culture   Final    NO GROWTH 2 DAYS Performed at [facility], [address], [place], [place] [address]    Report Status PENDING  Incomplete  C difficile quick scan w PCR reflex     Status: None   Collection Time: 08/25/19  6:12 PM   Specimen: STOOL  Result Value Ref Range Status   C Diff antigen NEGATIVE NEGATIVE Final   C Diff toxin NEGATIVE NEGATIVE Final   C Diff interpretation No C. difficile detected.  Final    Comment: Performed at [facility], [address] [place],  [address]         Radiology Studies: Ct Abdomen Pelvis W Contrast  Result Date: 08/25/2019 CLINICAL DATA:  Abdominal pain and fever, history of recent ileostomy reversal. EXAM: CT ABDOMEN AND PELVIS WITH CONTRAST TECHNIQUE: Multidetector CT imaging of the abdomen and pelvis was performed using the standard protocol following bolus administration of intravenous contrast. CONTRAST:  118mL OMNIPAQUE IOHEXOL 300 MG/ML  SOLN COMPARISON:  08/22/2019 FINDINGS: Lower chest: Small right pleural effusion is now seen new from the prior exam. Hepatobiliary: Fatty infiltration of the liver is noted. The gallbladder has been surgically removed. A small cyst is noted within the right lobe of the liver inferiorly. Pancreas: Unremarkable. No pancreatic ductal dilatation or surrounding inflammatory changes. Spleen: Normal in size without focal abnormality. Adrenals/Urinary Tract: Adrenal glands are stable in appearance. Kidneys demonstrate a normal enhancement pattern similar to that seen on the prior exam. Delayed images demonstrate normal excretion of contrast from both kidneys. A small less than 1 cm left renal cyst is seen. The  bladder is well distended. Stomach/Bowel: The rectum is noted. There is a low anastomosis to the rectum identified with multiple edematous loops of colon proximal to this although no obstructive changes are seen. The terminal ileum is within normal limits. Previously seen fluid collection adjacent to the cecal tip is  again identified best seen on image number 56 of series 2. This measures approximately 4.0 x 1.5 cm and is stable in appearance from the prior exam. Mild enhancement is noted and again may represent a small postoperative hematoma or complex seroma. The degree of gas locules in the collection have decreased in the interval from the prior exam. Ileal anastomosis is again identified without evidence of extravasation. The postsurgical changes in the anterior abdominal wall are stable. Fluid and air is noted. More proximal small bowel is within normal limits. Vascular/Lymphatic: Aortic atherosclerosis. No enlarged abdominal or pelvic lymph nodes. Reproductive: Status post hysterectomy. No adnexal masses. Other: No abdominal wall hernia or abnormality. No abdominopelvic ascites. Musculoskeletal: Degenerative changes of lumbar spine are seen and stable from the prior exam. IMPRESSION: New right-sided pleural effusion. Stable mildly peripherally enhancing fluid collection in the right lower quadrant adjacent to the cecal tip. This again likely represents a small postoperative hematoma or mildly complex seroma. The need for continued follow-up can be determined on a clinical basis. Changes consistent with the recent ileostomy reversal in the right anterior abdominal wall. Edematous changes are noted within the abdomen related to the recent surgery stable in appearance from the prior exam. No new focal abnormality is noted. Electronically Signed   By: Inez Catalina M.D.   On: 08/25/2019 19:49        Scheduled Meds: . acidophilus  1 capsule Oral Daily  . amLODipine  10 mg Oral QHS  . feeding supplement  (ENSURE ENLIVE)  237 mL Oral BID BM  . fluticasone furoate-vilanterol  1 puff Inhalation BH-q7a  . heparin  5,000 Units Subcutaneous Q8H  . hydrALAZINE  75 mg Oral Q6H  . insulin aspart  0-5 Units Subcutaneous QHS  . insulin aspart  0-9 Units Subcutaneous TID WC  . ipratropium-albuterol  3 mL Nebulization Q6H  . metoprolol succinate  100 mg Oral Daily  . mirtazapine  15 mg Oral QHS  . montelukast  10 mg Oral QHS  . multivitamin with minerals  1 tablet Oral Daily  . OLANZapine  10 mg Oral QHS  . pantoprazole  40 mg Oral QHS  . potassium chloride SA  20 mEq Oral Daily  . potassium chloride  40 mEq Oral Q4H  . predniSONE  10 mg Oral Q breakfast  . pregabalin  75 mg Oral BID  . sertraline  200 mg Oral QHS  . terazosin  1 mg Oral QHS   Continuous Infusions: . sodium chloride 250 mL (08/26/19 0830)     LOS: 9 days    Time spent: 35 minutes    Nicolette Bang, MD [facility]  If 7PM-7AM, please contact night-coverage  08/27/2019, 10:57 AM

## 2019-08-27 NOTE — Consult Note (Signed)
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  Goal of Therapy:  Electrolytes WNL  Plan:   Potassium replaced with a total of 80 mEq oral KCl by Dr Wyonia Hough  She remains on oral KCl 20 mEq daily: continue  Re-check potassium at 1800  BMP, magnesium in am  Gloria Allen ,PharmD Clinical Pharmacist 08/27/2019 2:07 PM

## 2019-08-27 NOTE — Progress Notes (Signed)
08/27/2019  Subjective: Patient is 9 Days Post-Op status post ileostomy takedown.  Yesterday, her antibiotics were discontinued and she was started on oral steroids for radiation pneumonitis on her right lung.  Today she reports she is doing well and has not had any major fevers over the last 24 hours.  Denies any abdominal pain or nausea and reports that she is feeling better today.  Vital signs: Temp:  [98.2 F (36.8 C)-100.4 F (38 C)] 100.4 F (38 C) (11/26 1139) Pulse Rate:  [77-106] 86 (11/26 1139) Resp:  [20] 20 (11/26 1139) BP: (151-187)/(48-71) 170/71 (11/26 1139) SpO2:  [93 %-96 %] 95 % (11/26 1139)   Intake/Output: 11/25 0701 - 11/26 0700 In: 120 [P.O.:120] Out: -  Last BM Date: 08/26/19  Physical Exam: Constitutional: No acute distress Abdomen: Soft, nondistended, with mild tenderness to palpation over the right lower quadrant incision.  Incision otherwise is clean and dry with no induration or erythema.  There is a wick in place with some old hematoma fluid but otherwise no purulent fluid at this point.  Labs:  Recent Labs    08/26/19 0730 08/27/19 0916  WBC 14.3* 16.8*  HGB 9.3* 9.1*  HCT 28.8* 28.1*  PLT 362 389   Recent Labs    08/26/19 0730 08/27/19 0916  NA 142 144  K 3.0* 2.7*  CL 105 106  CO2 24 24  GLUCOSE 141* 156*  BUN 8 7*  CREATININE 0.87 0.74  CALCIUM 8.4* 8.5*   No results for input(s): LABPROT, INR in the last 72 hours.  Imaging: No results found.  Assessment/Plan: This is a 73 y.o. female s/p ileostomy takedown.  -Patient's potassium was low this morning we will replete with oral potassium. -The patient's white blood cell count is elevated today at 16.8, however this is in the setting of oral steroids which could be causing this.  Since she has not had fevers recently, I be less concerned about worsening infection.  However we will continue monitoring. -Continue regular diet. -The patient remains afebrile today, may be able to  discharge her home tomorrow.   Melvyn Neth, [facility]

## 2019-08-28 LAB — BASIC METABOLIC PANEL
Anion gap: 10 (ref 5–15)
BUN: 8 mg/dL (ref 8–23)
CO2: 26 mmol/L (ref 22–32)
Calcium: 8.6 mg/dL — ABNORMAL LOW (ref 8.9–10.3)
Chloride: 107 mmol/L (ref 98–111)
Creatinine, Ser: 0.63 mg/dL (ref 0.44–1.00)
GFR calc Af Amer: >60 mL/min (ref >60)
GFR calc non Af Amer: >60 mL/min (ref >60)
Glucose, Bld: 131 mg/dL — ABNORMAL HIGH (ref 70–99)
Potassium: 3.4 mmol/L — ABNORMAL LOW (ref 3.5–5.1)
Sodium: 143 mmol/L (ref 135–145)

## 2019-08-28 LAB — CBC
HCT: 27 % — ABNORMAL LOW (ref 36.0–46.0)
Hemoglobin: 8.9 g/dL — ABNORMAL LOW (ref 12.0–15.0)
MCH: 28.3 pg (ref 26.0–34.0)
MCHC: 33 g/dL (ref 30.0–36.0)
MCV: 86 fL (ref 80.0–100.0)
Platelets: 371 10*3/uL (ref 150–400)
RBC: 3.14 MIL/uL — ABNORMAL LOW (ref 3.87–5.11)
RDW: 14.4 % (ref 11.5–15.5)
WBC: 14.5 10*3/uL — ABNORMAL HIGH (ref 4.0–10.5)
nRBC: 0 % (ref 0.0–0.2)

## 2019-08-28 LAB — GLUCOSE, CAPILLARY
Glucose-Capillary: 134 mg/dL — ABNORMAL HIGH (ref 70–99)
Glucose-Capillary: 266 mg/dL — ABNORMAL HIGH (ref 70–99)

## 2019-08-28 LAB — FUNGITELL, SERUM: Fungitell Result: 36 pg/mL (ref <80)

## 2019-08-28 LAB — MAGNESIUM: Magnesium: 1.8 mg/dL (ref 1.7–2.4)

## 2019-08-28 MED ORDER — POTASSIUM CHLORIDE CRYS ER 20 MEQ PO TBCR
20.0000 meq | EXTENDED_RELEASE_TABLET | Freq: Once | ORAL | Status: AC
  Administered 2019-08-28: 20 meq via ORAL
  Filled 2019-08-28: qty 1

## 2019-08-28 MED ORDER — PREDNISONE 10 MG PO TABS: 10.0000 mg | ORAL_TABLET | Freq: Every day | ORAL | 0 refills | Status: AC

## 2019-08-28 MED ORDER — MAGNESIUM SULFATE IN D5W 1-5 GM/100ML-% IV SOLN
1.0000 g | Freq: Once | INTRAVENOUS | Status: AC
  Administered 2019-08-28: 1 g via INTRAVENOUS
  Filled 2019-08-28: qty 100

## 2019-08-28 MED ORDER — DOCUSATE SODIUM 100 MG PO CAPS: 100.0000 mg | ORAL_CAPSULE | Freq: Two times a day (BID) | ORAL | 0 refills | Status: AC | PRN

## 2019-08-28 MED ORDER — PREDNISONE 10 MG PO TABS: 10.0000 mg | ORAL_TABLET | Freq: Every day | ORAL | 0 refills | Status: DC

## 2019-08-28 MED ORDER — HYDROCODONE-ACETAMINOPHEN 5-325 MG PO TABS: 1.0000 | ORAL_TABLET | Freq: Four times a day (QID) | ORAL | 0 refills | Status: DC | PRN

## 2019-08-28 MED ORDER — ACETAMINOPHEN 325 MG PO TABS: 650.0000 mg | ORAL_TABLET | Freq: Three times a day (TID) | ORAL | 0 refills | Status: AC | PRN

## 2019-08-28 MED ORDER — IBUPROFEN 800 MG PO TABS: 800.0000 mg | ORAL_TABLET | Freq: Three times a day (TID) | ORAL | 0 refills | Status: DC | PRN

## 2019-08-28 NOTE — TOC Transition Note (Signed)
Transition of Care [facility]) - CM/SW Discharge Note   Patient Details  Name: Gloria Allen MRN: [id number] Date of Birth: 11/15/1945  [facility] CM/SW Contact:  Beverly Sessions, RN Phone Number: 08/28/2019, 8:01 PM   Clinical Narrative:     Patient discharged home today with resumption of home health services Malachy Mood with [facility] notified of discharge   Patient is followed by outpatient palliative.  Santiago Glad with [employer] notified   Final next level of care: [place] Barriers to Discharge: No Barriers Identified   Patient Goals and CMS Choice Patient states their goals for this hospitalization and ongoing recovery are:: "discharge home tomorrow" CMS Medicare.gov Compare Post Acute Care list provided to:: Patient Choice offered to / list presented to : Patient  Discharge Placement                       Discharge Plan and Services In-house Referral: Clinical Social Work Discharge Planning Services: CM Consult Post Acute Care Choice: Home Health                    HH Arranged: RN, PT, Social Work [facility] Agency: [place] Date [facility] Agency Contacted: 08/28/19 Time [place]: [phone] Representative spoke with at [place]: [place] ([place]) Interventions     Readmission Risk Interventions Readmission Risk Prevention Plan 08/19/2019  Transportation Screening Complete  HRI or [place] Complete  Social Work Consult for [place] Planning/Counseling Complete  Palliative Care Screening Complete  Medication Review [employer]) Complete  Some encounter information is confidential and restricted. Go to Review Flowsheets activity to see all data.  Some recent data might be hidden

## 2019-08-28 NOTE — Care Management Important Message (Signed)
Important Message  Patient Details  Name: Gloria Allen MRN: [id number] Date of Birth: October 12, 1945   Medicare Important Message Given:  Yes     Dannette Barbara 08/28/2019, 12:21 PM

## 2019-08-28 NOTE — Discharge Instructions (Signed)
Ileostomy reverssal, Care After This sheet gives you information about how to care for yourself after your procedure. Your health care provider may also give you more specific instructions. If you have problems or questions, contact your health care provider. What can I expect after the procedure? After your procedure, it is common to have the following:  Pain in your abdomen, especially in the incision areas. You will be given medicine to control the pain.  Tiredness. This is a normal part of the recovery process. Your energy level will return to normal over the next several weeks.  Changes in your bowel movements, such as constipation or needing to go more often. Talk with your health care provider about how to manage this. Follow these instructions at home: Medicines   tylenol and advil as needed for discomfort.  Please alternate between the two every four hours as needed for pain.     Use narcotics, if prescribed, only when tylenol and motrin is not enough to control pain.   325-650mg  every 8hrs to max of 4000mg /24hrs (including the 325mg  in every norco dose) for the tylenol.     Advil up to 800mg  per dose every 8hrs as needed for pain.    Do not drive or use heavy machinery while taking prescription pain medicine.  Do not drink alcohol while taking prescription pain medicine. Incision care     Follow instructions from your health care provider about how to take care of your incision areas. Make sure you: ? Keep your incisions clean and dry. ? Wash your hands with soap and water before and after applying medicine to the areas, and before and after changing your bandage (dressing). If soap and water are not available, use hand sanitizer. ? Change your dressing as told by your health care provider. ? Leave stitches (sutures), skin glue, or adhesive strips in place. These skin closures may need to stay in place for 2 weeks or longer. If adhesive strip edges start to loosen and curl  up, you may trim the loose edges. Do not remove adhesive strips completely unless your health care provider tells you to do that.  Do not wear tight clothing over the incisions. Tight clothing may rub and irritate the incision areas, which may cause the incisions to open.  Do not take baths, swim, or use a hot tub until your health care provider approves. OK TO SHOWER.    Check your incision area every day for signs of infection. Check for: ? More redness, swelling, or pain. ? More fluid or blood. ? Warmth. ? Pus or a bad smell. Activity  Avoid lifting anything that is heavier than 10 lb (4.5 kg) for 2 weeks or until your health care provider says it is okay.  You may resume normal activities as told by your health care provider. Ask your health care provider what activities are safe for you.  Take rest breaks during the day as needed. Eating and drinking  Follow instructions from your health care provider about what you can eat after surgery.  To prevent or treat constipation while you are taking prescription pain medicine, your health care provider may recommend that you: ? Drink enough fluid to keep your urine clear or pale yellow. ? Take over-the-counter or prescription medicines. ? Eat foods that are high in fiber, such as fresh fruits and vegetables, whole grains, and beans. ? Limit foods that are high in fat and processed sugars, such as fried and sweet foods. General instructions  Ask your health care provider when you will need an appointment to get your sutures or staples removed.  Keep all follow-up visits as told by your health care provider. This is important. Contact a health care provider if:  You have more redness, swelling, or pain around your incisions.  You have more fluid or blood coming from the incisions.  Your incisions feel warm to the touch.  You have pus or a bad smell coming from your incisions or your dressing.  You have a fever.  You have an  incision that breaks open (edges not staying together) after sutures or staples have been removed. Get help right away if:  You develop a rash.  You have chest pain or difficulty breathing.  You have pain or swelling in your legs.  You feel light-headed or you faint.  Your abdomen swells (becomes distended).  You have nausea or vomiting.  You have blood in your stool (feces). This information is not intended to replace advice given to you by your health care provider. Make sure you discuss any questions you have with your health care provider. Document Released: 04/06/2005 Document Revised: 06/06/2018 Document Reviewed: 06/18/2016 Elsevier Interactive Patient Education  2019 [employer].

## 2019-08-28 NOTE — Progress Notes (Signed)
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 The hospitalist service was consulted out of concern for possible aspiration pneumonia resulting in fevers and dyspnea by the patient on 08/22/2019.  She has a past medical history is significant for hypertension, colon cancer, chronic pain, DDD of cervical and lumbar vertebral bodies, small cell lung CA depression, entrapment syndrome, GERD, headaches, chronic itching and UTI.  Abdominal film performed on 08/21/2019 has demonstrated new ground-glass opacity and consolidation in the right perihilar lung concerning for pneumonia. There is also a small focus of opacity in the left peripheral mid lung thought to correspond to a pulmonary nodule noted on prior CT. She has also been febrile and dyspneic with a non-productive cough. She is receiving IV Zosyn and breathing treatments   Assessment & Plan:   Principal Problem:   Aspiration pneumonia of right lung (HCC) Active Problems:   GERD (gastroesophageal reflux disease)   SOB (shortness of breath)   Malignant neoplasm of colon (HCC)   Small cell lung cancer (HCC)   Essential hypertension   S/P closure of ileostomy   Aspiration PNA:  -Antibiotics discontinued -ID following -Fever curve coming down and felt to be tumor related  Small cell lung cancer/ colon cancer:  -Pt is followed closely by her oncologist . -Directed by oncology for outpatient treatment  GERD: -PPI therapy continued.  HTN:  -Patient reports a long history of blood pressure highly fluctuant -Meds reviewed.  -Continue amlodipine, hydralazine, Toprol.  Will avoid aggressive adjustment now will need close follow-up with her outpatient  provider to avoid bottoming out with aggressive treatment  DVT prophylaxis: Heparin SQ  Code Status: full    Code Status Orders  (From admission, onward)         Start     Ordered   08/18/19 1507  Full code  Continuous     08/18/19 1506        Code Status History    Date Active Date Inactive Code Status Order ID Comments User Context   03/25/2019 0221 03/27/2019 2012 Full Code [id number]  Mayer Camel, NP ED   11/28/2018 1137 12/03/2018 1801 DNR [id number]  Demetrios Loll, MD Inpatient   11/27/2018 2029 11/28/2018 1137 Full Code [id number]  Jules Husbands, MD Inpatient   11/21/2018 1945 11/22/2018 0017 Full Code [id number]  Corky Mull, MD ED   03/26/2017 1223 03/27/2017 1740 Full Code [id number]  Thornton Park, MD Inpatient   Advance Care Planning Activity    Advance Directive Documentation     Most Recent Value  Type of Advance Directive  Out of facility DNR (pink MOST or yellow form)  Pre-existing out of facility DNR order (yellow form or pink MOST form)  Physician notified to receive inpatient order  "MOST" Form in Place?  -     Family Communication: Confidential patient, discussed in detail with patient Disposition Plan:   Per primary team Consults called: None Admission status: Inpatient   Consultants:   TRH  Procedures:  Dg Chest 1 View  Result Date: 08/25/2019 CLINICAL DATA:  Pneumonia. EXAM: CHEST  1 VIEW COMPARISON:  CTA of the chest 08/23/2019 FINDINGS: Heart is enlarged. There is no edema. Asymmetric right-sided airspace disease is again noted. Left  lung is clear. Small right pleural effusion is again noted. IMPRESSION: 1. Stable right-sided airspace disease compatible with pneumonia. 2. Stable small right pleural effusion. 3. Stable cardiomegaly without failure. Electronically Signed   By: San Morelle M.D.   On: 08/25/2019 11:20   Dg Lumbar Spine Complete W/bend  Result Date: 07/30/2019 CLINICAL DATA:  Low back pain for several years, no known injury,  initial encounter EXAM: LUMBAR SPINE - COMPLETE WITH BENDING VIEWS COMPARISON:  01/31/2017 FINDINGS: Five lumbar type vertebral bodies are well visualized. Osteophytic changes are noted. Facet hypertrophic changes are noted as well. No pars defects are seen. Degenerative anterolisthesis of L4 on L5 is seen stable in appearance from the prior exam. Flexion and extension views show no significant instability. Postsurgical changes in the pelvis are noted. IMPRESSION: Multilevel degenerative change with anterolisthesis of L4 on L5 without significant instability. Electronically Signed   By: Inez Catalina M.D.   On: 07/30/2019 00:35   Dg Si Joints  Result Date: 07/29/2019 CLINICAL DATA:  Low back pain since retiring in 2002, no known injury, history degenerative disc disease EXAM: BILATERAL SACROILIAC JOINTS - 3+ VIEW COMPARISON:  07/05/2017 FINDINGS: Osseous demineralization. Hip and SI joint spaces preserved. Degenerative disc and facet disease changes at visualized lower lumbar spine. No fracture or bone destruction. No radiographic evidence of sacroiliitis. IMPRESSION: Osseous demineralization with degenerative disc and facet disease changes of lower lumbar spine. No acute SI joint abnormalities. Electronically Signed   By: Lavonia Dana M.D.   On: 07/29/2019 18:07   Ct Angio Chest Pe W Or Wo Contrast  Result Date: 08/23/2019 CLINICAL DATA:  Shortness of breath EXAM: CT ANGIOGRAPHY CHEST WITH CONTRAST TECHNIQUE: Multidetector CT imaging of the chest was performed using the standard protocol during bolus administration of intravenous contrast. Multiplanar CT image reconstructions and MIPs were obtained to evaluate the vascular anatomy. CONTRAST:  23mL OMNIPAQUE IOHEXOL 350 MG/ML SOLN COMPARISON:  06/05/2019 FINDINGS: Cardiovascular: Contrast injection is sufficient to demonstrate satisfactory opacification of the pulmonary arteries to the segmental level. There is no pulmonary embolus. The main pulmonary artery  is dilated measuring approximately 3.5 cm. There is no CT evidence of acute right heart strain. The visualized aorta is normal. Heart size is normal, without pericardial effusion. Mediastinum/Nodes: --No mediastinal or hilar lymphadenopathy. --No axillary lymphadenopathy. --No supraclavicular lymphadenopathy. --Normal thyroid gland. --The esophagus is unremarkable Lungs/Pleura: There confluent ground-glass airspace opacities within the right upper lobe and to a lesser extent right middle and right lower lobes. There is fluid along the major and minor fissures. There is some smooth interlobular septal thickening involving the right upper lobe. There are a few peripheral subcentimeter pulmonary nodules in the right upper lobe. There is a relatively stable pulmonary opacity in the left upper lobe measuring approximately 1.8 cm (previously measuring the same). There is a small right-sided pleural effusion with adjacent atelectasis. Upper Abdomen: No acute abnormality. Musculoskeletal: No chest wall abnormality. No acute or significant osseous findings. There is some subcutaneous fat stranding involving the right chest wall. The this is favored to represent sequela of a recently removed right-sided Port-A-Cath. Review of the MIP images confirms the above findings. IMPRESSION: 1. Ground-glass airspace opacities and consolidation throughout the right lung field is concerning for multifocal pneumonia or aspiration in the appropriate clinical setting. 2. Small right-sided parapneumonic effusion. 3. Stable pulmonary opacity in the left upper lobe currently measuring 1.8 cm. 4. No acute pulmonary embolism. The main pulmonary artery is dilated which can be seen in patients  with elevated pulmonary artery pressures. Electronically Signed   By: Constance Holster M.D.   On: 08/23/2019 17:51   Ct Abdomen Pelvis W Contrast  Result Date: 08/25/2019 CLINICAL DATA:  Abdominal pain and fever, history of recent ileostomy reversal.  EXAM: CT ABDOMEN AND PELVIS WITH CONTRAST TECHNIQUE: Multidetector CT imaging of the abdomen and pelvis was performed using the standard protocol following bolus administration of intravenous contrast. CONTRAST:  16mL OMNIPAQUE IOHEXOL 300 MG/ML  SOLN COMPARISON:  08/22/2019 FINDINGS: Lower chest: Small right pleural effusion is now seen new from the prior exam. Hepatobiliary: Fatty infiltration of the liver is noted. The gallbladder has been surgically removed. A small cyst is noted within the right lobe of the liver inferiorly. Pancreas: Unremarkable. No pancreatic ductal dilatation or surrounding inflammatory changes. Spleen: Normal in size without focal abnormality. Adrenals/Urinary Tract: Adrenal glands are stable in appearance. Kidneys demonstrate a normal enhancement pattern similar to that seen on the prior exam. Delayed images demonstrate normal excretion of contrast from both kidneys. A small less than 1 cm left renal cyst is seen. The bladder is well distended. Stomach/Bowel: The rectum is noted. There is a low anastomosis to the rectum identified with multiple edematous loops of colon proximal to this although no obstructive changes are seen. The terminal ileum is within normal limits. Previously seen fluid collection adjacent to the cecal tip is again identified best seen on image number 56 of series 2. This measures approximately 4.0 x 1.5 cm and is stable in appearance from the prior exam. Mild enhancement is noted and again may represent a small postoperative hematoma or complex seroma. The degree of gas locules in the collection have decreased in the interval from the prior exam. Ileal anastomosis is again identified without evidence of extravasation. The postsurgical changes in the anterior abdominal wall are stable. Fluid and air is noted. More proximal small bowel is within normal limits. Vascular/Lymphatic: Aortic atherosclerosis. No enlarged abdominal or pelvic lymph nodes. Reproductive:  Status post hysterectomy. No adnexal masses. Other: No abdominal wall hernia or abnormality. No abdominopelvic ascites. Musculoskeletal: Degenerative changes of lumbar spine are seen and stable from the prior exam. IMPRESSION: New right-sided pleural effusion. Stable mildly peripherally enhancing fluid collection in the right lower quadrant adjacent to the cecal tip. This again likely represents a small postoperative hematoma or mildly complex seroma. The need for continued follow-up can be determined on a clinical basis. Changes consistent with the recent ileostomy reversal in the right anterior abdominal wall. Edematous changes are noted within the abdomen related to the recent surgery stable in appearance from the prior exam. No new focal abnormality is noted. Electronically Signed   By: Inez Catalina M.D.   On: 08/25/2019 19:49   Ct Abdomen Pelvis W Contrast  Result Date: 08/22/2019 CLINICAL DATA:  Fever. Worsening abdominal pain after ileostomy reversal 4 days ago. EXAM: CT ABDOMEN AND PELVIS WITH CONTRAST TECHNIQUE: Multidetector CT imaging of the abdomen and pelvis was performed using the standard protocol following bolus administration of intravenous contrast. CONTRAST:  152mL OMNIPAQUE IOHEXOL 300 MG/ML  SOLN COMPARISON:  06/05/2019 FINDINGS: Lower chest: Patchy ground-glass attenuation noted central right lower lobe. Small right pleural effusion is associated with right base atelectasis. Hepatobiliary: Stable tiny cyst inferior right hepatic lobe. Gallbladder surgically absent. No intrahepatic or extrahepatic biliary dilation. Pancreas: No focal mass lesion. No dilatation of the main duct. No intraparenchymal cyst. No peripancreatic edema. Spleen: No splenomegaly. No focal mass lesion. Adrenals/Urinary Tract: No adrenal nodule or mass. Tiny  calcifications in the hilum of the left kidney are vascular, best appreciated on delayed imaging. 5 mm low-density lesion upper pole left kidney is too small to  characterize but likely benign. Right kidney unremarkable. No evidence for hydroureter. The urinary bladder appears normal for the degree of distention. Stomach/Bowel: Stomach is unremarkable. No gastric wall thickening. No evidence of outlet obstruction. Duodenum is normally positioned as is the ligament of Treitz. No small bowel wall thickening. No small bowel dilatation. Status post ileal anastomosis right lower quadrant in the region of the distal ileum. Terminal ileum unremarkable. Oral contrast material has migrated through the ileal anastomosis and terminal ileum and is just starting to opacified the cecum. No evidence for contrast extravasation at the ileal anastomosis. The appendix is not visualized, but there is no edema or inflammation in the region of the cecum. Status post left hemicolectomy with rectal anastomosis. Vascular/Lymphatic: There is abdominal aortic atherosclerosis without aneurysm. There is no gastrohepatic or hepatoduodenal ligament lymphadenopathy. No intraperitoneal or retroperitoneal lymphadenopathy. No pelvic sidewall lymphadenopathy. Reproductive: The uterus is surgically absent. There is no adnexal mass. Other: Small collection of fluid and gas identified right lower anterior abdominal wall near the location of the ileostomy take down. This is not unexpected on postoperative day 4 and may reflect a tiny residual seroma or hematoma. There is some fluid in the anterior peritoneal cavity of the right lower quadrant around the small bowel anastomosis and cecal tip (see image 61 of series 2). This fluid has attenuation higher than would be expected for serous fluid. Several small gas locules are seen within this fluid and there is edema/stranding in the ileocolic mesentery Musculoskeletal: Gas locules in the subcutaneous fat of the left anterior abdominal wall are likely injection sites. No worrisome lytic or sclerotic osseous abnormality. IMPRESSION: 1. Small fluid collection anterior  right lower quadrant around the distal ileal anastomosis and cecal tip. Attenuation of the fluid is higher than would be expected for serous fluid and several small gas bubbles are evident within the fluid. There may be some overlying peritoneal enhancement in this region. Postoperative day 4 would be earlier than expected for abscess and features likely reflect some residual small hematoma/blood products. Extraluminal gas is not unexpected on postoperative day 4. 2. Subcutaneous gas in small subcutaneous fluid collection in the right anterior abdominal wall at the location of ileostomy takedown. This is probably a tiny residual seroma, and again, extraluminal gas is not unexpected 4 days after surgery. 3. No evidence for bowel obstruction or anastomotic leak. The oral contrast has migrated through the ileal anastomosis into the terminal ileum and cecum. Contrast has not yet reached the distal colon although there is no evidence for fluid around the rectal anastomosis. 4. Edema/stranding in the ileocolic mesentery is presumably sequelae of recent surgery. 5. Trace right lower lobe collapse/consolidation with small right pleural effusion. 6.  Aortic Atherosclerois (ICD10-170.0) Electronically Signed   By: Misty Stanley M.D.   On: 08/22/2019 13:14   US Venous Img Lower Bilateral (dvt)  Result Date: 08/22/2019 CLINICAL DATA:  Dyspnea.  Inpatient. EXAM: BILATERAL LOWER EXTREMITY VENOUS DOPPLER ULTRASOUND TECHNIQUE: Gray-scale sonography with graded compression, as well as color Doppler and duplex ultrasound were performed to evaluate the lower extremity deep venous systems from the level of the common femoral vein and including the common femoral, femoral, profunda femoral, popliteal and calf veins including the posterior tibial, peroneal and gastrocnemius veins when visible. The superficial great saphenous vein was also interrogated. Spectral Doppler was utilized  to evaluate flow at rest and with distal  augmentation maneuvers in the common femoral, femoral and popliteal veins. COMPARISON:  None. FINDINGS: RIGHT LOWER EXTREMITY Common Femoral Vein: No evidence of thrombus. Normal compressibility, respiratory phasicity and response to augmentation. Saphenofemoral Junction: No evidence of thrombus. Normal compressibility and flow on color Doppler imaging. Profunda Femoral Vein: No evidence of thrombus. Normal compressibility and flow on color Doppler imaging. Femoral Vein: No evidence of thrombus. Normal compressibility, respiratory phasicity and response to augmentation. Popliteal Vein: No evidence of thrombus. Normal compressibility, respiratory phasicity and response to augmentation. Calf Veins: No evidence of thrombus. Normal compressibility and flow on color Doppler imaging. Superficial Great Saphenous Vein: No evidence of thrombus. Normal compressibility. Venous Reflux:  None. Other Findings:  None. LEFT LOWER EXTREMITY Common Femoral Vein: No evidence of thrombus. Normal compressibility, respiratory phasicity and response to augmentation. Saphenofemoral Junction: No evidence of thrombus. Normal compressibility and flow on color Doppler imaging. Profunda Femoral Vein: No evidence of thrombus. Normal compressibility and flow on color Doppler imaging. Femoral Vein: No evidence of thrombus. Normal compressibility, respiratory phasicity and response to augmentation. Popliteal Vein: No evidence of thrombus. Normal compressibility, respiratory phasicity and response to augmentation. Calf Veins: No evidence of thrombus. Normal compressibility and flow on color Doppler imaging. Superficial Great Saphenous Vein: No evidence of thrombus. Normal compressibility. Venous Reflux:  None. Other Findings:  None. IMPRESSION: No evidence of deep venous thrombosis in either lower extremity. Electronically Signed   By: Ilona Sorrel M.D.   On: 08/22/2019 18:33   Dg Abd Acute 2+v W 1v Chest  Result Date: 08/21/2019 CLINICAL DATA:   Fever, generalized weakness EXAM: DG ABDOMEN ACUTE W/ 1V CHEST COMPARISON:  04/28/2019, CT 06/05/2019 FINDINGS: Single-view chest demonstrates new ground-glass opacity and consolidation in the right perihilar lung. Smaller focus of airspace disease in the left mid to lower lung, likely corresponding to pulmonary lung nodule. Mild cardiomegaly. No pneumothorax. Postsurgical changes in the right humeral head. Supine and upright views of the abdomen demonstrate no free air beneath the diaphragm. Postsurgical changes in the pelvis. Cutaneous staples over the right lower quadrant. Air distended probable large bowel with fluid levels in the right lower quadrant, no definite dilated small bowel. IMPRESSION: 1. New ground-glass opacity and consolidation in the right perihilar lung concerning for pneumonia. Radiographic follow-up to resolution recommended. Small focus of opacity in the left peripheral mid lung thought to correspond to a pulmonary nodule noted on prior CT 2. Mild air distended right lower quadrant colon, though without small bowel distention to suggest obstruction. These results will be called to the ordering clinician or representative by the Radiologist Assistant, and communication documented in the PACS or zVision Dashboard. Electronically Signed   By: Donavan Foil M.D.   On: 08/21/2019 21:02   Ct Head Code Stroke Wo Contrast`  Result Date: 08/20/2019 CLINICAL DATA:  Code stroke. Right upper extremity weakness developing of the last 90 minutes. Possible slurred speech. EXAM: CT HEAD WITHOUT CONTRAST TECHNIQUE: Contiguous axial images were obtained from the base of the skull through the vertex without intravenous contrast. COMPARISON:  02/26/2019.  04/07/2016. FINDINGS: Brain: Chronic small-vessel ischemic changes affect the cerebral hemispheric white matter. No sign of acute infarction, mass lesion, hemorrhage, hydrocephalus or extra-axial collection. Vascular: No abnormal vascular finding.  No  hyperdense vessel. Skull: Negative Sinuses/Orbits: Clear/normal Other: None ASPECTS (Artesian Stroke Program Early CT Score) - Ganglionic level infarction (caudate, lentiform nuclei, internal capsule, insula, M1-M3 cortex): 7 - Supraganglionic infarction (M4-M6 cortex): 3  Total score (0-10 with 10 being normal): 10 IMPRESSION: 1. No acute finding. Chronic small-vessel ischemic changes of the white matter. 2. ASPECTS is 10 3. Call report in progress. Electronically Signed   By: Nelson Chimes M.D.   On: 08/20/2019 15:38     Antimicrobials:    ZOSYN 11/20-11/25        VANC 11/21-11/25   Subjective: Patient reports feeling fine She discussed with her primary team possibly going home today  Objective: Vitals:   08/27/19 2000 08/27/19 2356 08/27/19 2359 08/28/19 0439  BP: (!) 181/65 (!) 181/70 (!) 158/60 (!) 192/61  Pulse: 90 86  94  Resp: 20   20  Temp: 99.7 F (37.6 C)   98.8 F (37.1 C)  TempSrc: Oral   Oral  SpO2: 96%   95%  Weight:      Height:        Intake/Output Summary (Last 24 hours) at 08/28/2019 1054 Last data filed at 08/28/2019 0900 Gross per 24 hour  Intake 840 ml  Output 300 ml  Net 540 ml   Filed Weights   08/18/19 0931  Weight: 70.9 kg    Examination:  General exam: Appears calm and comfortable  Respiratory system: Clear to auscultation. Respiratory effort normal. Cardiovascular system: S1 & S2 heard, RRR. No JVD, murmurs, rubs, gallops or clicks. No pedal edema. Gastrointestinal system: Abdomen is nondistended, soft and nontender. No organomegaly or masses felt. Normal bowel sounds heard. Central nervous system: Alert and oriented. No focal neurological deficits. Extremities: Warm well perfused, no edema, no contractures neurovascularly intact Skin: No rashes, lesions or ulcers Psychiatry: Judgement and insight appear normal. Mood & affect appropriate.     Data Reviewed: I have personally reviewed following labs and imaging studies  CBC: Recent Labs   Lab 08/23/19 0612 08/24/19 0420 08/25/19 0743 08/26/19 0730 08/27/19 0916 08/28/19 0733  WBC 12.7* 11.7* 12.0* 14.3* 16.8* 14.5*  NEUTROABS 10.6*  --   --   --   --   --   HGB 9.3* 8.9* 9.0* 9.3* 9.1* 8.9*  HCT 28.5* 26.4* 28.2* 28.8* 28.1* 27.0*  MCV 85.1 84.9 88.4 88.3 88.1 86.0  PLT 227 253 307 362 389 169   Basic Metabolic Panel: Recent Labs  Lab 08/23/19 0612 08/24/19 0420 08/26/19 0730 08/27/19 0916 08/27/19 1801 08/28/19 0733  NA 138 140 142 144  --  143  K 3.3* 3.5 3.0* 2.7* 4.2 3.4*  CL 104 105 105 106  --  107  CO2 21* 23 24 24   --  26  GLUCOSE 137* 140* 141* 156*  --  131*  BUN 8 7* 8 7*  --  8  CREATININE 0.72 0.73 0.87 0.74  --  0.63  CALCIUM 8.6* 8.6* 8.4* 8.5*  --  8.6*  MG 1.7  --   --  1.9  --  1.8  PHOS 2.8  --   --   --   --   --    GFR: Estimated Creatinine Clearance: 63.7 mL/min (by C-G formula based on SCr of 0.63 mg/dL). Liver Function Tests: Recent Labs  Lab 08/22/19 0559 08/23/19 0612 08/26/19 0730  AST 23 21 22   ALT 19 21 29   ALKPHOS 107 125 132*  BILITOT 0.6 0.6 0.7  PROT 6.3* 6.3* 6.7  ALBUMIN 2.8* 2.7* 2.6*   No results for input(s): LIPASE, AMYLASE in the last 168 hours. No results for input(s): AMMONIA in the last 168 hours. Coagulation Profile: No results for input(s): INR, PROTIME  in the last 168 hours. Cardiac Enzymes: No results for input(s): CKTOTAL, CKMB, CKMBINDEX, TROPONINI in the last 168 hours. BNP (last 3 results) No results for input(s): PROBNP in the last 8760 hours. HbA1C: No results for input(s): HGBA1C in the last 72 hours. CBG: Recent Labs  Lab 08/27/19 0744 08/27/19 1141 08/27/19 1640 08/27/19 2116 08/28/19 0758  GLUCAP 119* 289* 142* 160* 134*   Lipid Profile: No results for input(s): CHOL, HDL, LDLCALC, TRIG, CHOLHDL, LDLDIRECT in the last 72 hours. Thyroid Function Tests: No results for input(s): TSH, T4TOTAL, FREET4, T3FREE, THYROIDAB in the last 72 hours. Anemia Panel: No results for  input(s): VITAMINB12, FOLATE, FERRITIN, TIBC, IRON, RETICCTPCT in the last 72 hours. Sepsis Labs: Recent Labs  Lab 08/24/19 2015  PROCALCITON 0.24    Recent Results (from the past 240 hour(s))  CULTURE, BLOOD (ROUTINE X 2) w Reflex to ID Panel     Status: None   Collection Time: 08/21/19  5:45 PM   Specimen: BLOOD  Result Value Ref Range Status   Specimen Description BLOOD RT [facility]  Final   Special Requests   Final    BOTTLES DRAWN AEROBIC AND ANAEROBIC Blood Culture adequate volume   Culture   Final    NO GROWTH 5 DAYS Performed at [facility], [address] [place], [place] [address]    Report Status 08/26/2019 FINAL  Final  CULTURE, BLOOD (ROUTINE X 2) w Reflex to ID Panel     Status: None   Collection Time: 08/21/19  5:48 PM   Specimen: BLOOD  Result Value Ref Range Status   Specimen Description BLOOD R HAND  Final   Special Requests   Final    BOTTLES DRAWN AEROBIC AND ANAEROBIC Blood Culture adequate volume   Culture   Final    NO GROWTH 5 DAYS Performed at [facility], [address], [place], [place] [address]    Report Status 08/26/2019 FINAL  Final  SARS CORONAVIRUS 2 (TAT 6-24 HRS) Nasopharyngeal Nasopharyngeal Swab     Status: None   Collection Time: 08/21/19  6:30 PM   Specimen: Nasopharyngeal Swab  Result Value Ref Range Status   SARS Coronavirus 2 NEGATIVE NEGATIVE Final    Comment: (NOTE) SARS-CoV-2 target nucleic acids are NOT DETECTED. The SARS-CoV-2 RNA is generally detectable in upper and lower respiratory specimens during the acute phase of infection. Negative results do not preclude SARS-CoV-2 infection, do not rule out co-infections with other pathogens, and should not be used as the sole basis for treatment or other patient management decisions. Negative results must be combined with clinical observations, patient history, and epidemiological information. The expected result is Negative. Fact Sheet for  Patients: SugarRoll.be Fact Sheet for Healthcare Providers: https://www.woods-mathews.com/ This test is not yet approved or cleared by the [place] FDA and  has been authorized for detection and/or diagnosis of SARS-CoV-2 by FDA under an Emergency Use Authorization (EUA). This EUA will remain  in effect (meaning this test can be used) for the duration of the COVID-19 declaration under Section 56 4(b)(1) of the Act, 21 U.S.C. section 360bbb-3(b)(1), unless the authorization is terminated or revoked sooner. Performed at [facility], [place] [address] [place], [place] [address]   MRSA PCR Screening     Status: None   Collection Time: 08/23/19  3:28 PM   Specimen: Nasal Mucosa; Nasopharyngeal  Result Value Ref Range Status   MRSA by PCR NEGATIVE NEGATIVE Final    Comment:  The GeneXpert MRSA Assay (FDA approved for NASAL specimens only), is one component of a comprehensive MRSA colonization surveillance program. It is not intended to diagnose MRSA infection nor to guide or monitor treatment for MRSA infections. Performed at [facility], [address] [place], [place] [address]   Culture, Urine     Status: None   Collection Time: 08/24/19  6:42 AM   Specimen: Urine, Random  Result Value Ref Range Status   Specimen Description   Final    URINE, RANDOM Performed at [facility], [address], [place], [place] [address]    Special Requests   Final    NONE Performed at [facility], [address], [place], [place] [address]    Culture   Final    NO GROWTH Performed at [facility], [place] [address] [place], [place] [address]    Report Status 08/25/2019 FINAL  Final  CULTURE, BLOOD (ROUTINE X 2) w Reflex to ID Panel     Status: None (Preliminary result)   Collection Time: 08/25/19  3:40 PM   Specimen: BLOOD  Result Value Ref Range Status   Specimen Description BLOOD RIGHT  ANTECUBITAL  Final   Special Requests   Final    BOTTLES DRAWN AEROBIC AND ANAEROBIC Blood Culture adequate volume   Culture   Final    NO GROWTH 3 DAYS Performed at [facility], [address] [place], [place] [address]    Report Status PENDING  Incomplete  CULTURE, BLOOD (ROUTINE X 2) w Reflex to ID Panel     Status: None (Preliminary result)   Collection Time: 08/25/19  3:47 PM   Specimen: BLOOD  Result Value Ref Range Status   Specimen Description BLOOD BLOOD RIGHT WRIST  Final   Special Requests   Final    BOTTLES DRAWN AEROBIC AND ANAEROBIC Blood Culture adequate volume   Culture   Final    NO GROWTH 3 DAYS Performed at [facility], [address] [place], [place] [address]    Report Status PENDING  Incomplete  C difficile quick scan w PCR reflex     Status: None   Collection Time: 08/25/19  6:12 PM   Specimen: STOOL  Result Value Ref Range Status   C Diff antigen NEGATIVE NEGATIVE Final   C Diff toxin NEGATIVE NEGATIVE Final   C Diff interpretation No C. difficile detected.  Final    Comment: Performed at [facility], [address] [place], [place] [address]         Radiology Studies: No results found.      Scheduled Meds: . acidophilus  1 capsule Oral Daily  . amLODipine  10 mg Oral QHS  . feeding supplement (ENSURE ENLIVE)  237 mL Oral BID BM  . fluticasone furoate-vilanterol  1 puff Inhalation BH-q7a  . heparin  5,000 Units Subcutaneous Q8H  . hydrALAZINE  75 mg Oral Q6H  . insulin aspart  0-5 Units Subcutaneous QHS  . insulin aspart  0-9 Units Subcutaneous TID WC  . ipratropium-albuterol  3 mL Nebulization Q6H  . metoprolol succinate  100 mg Oral Daily  . mirtazapine  15 mg Oral QHS  . montelukast  10 mg Oral QHS  . multivitamin with minerals  1 tablet Oral Daily  . OLANZapine  10 mg Oral QHS  . pantoprazole  40 mg Oral QHS  . potassium chloride SA  20 mEq Oral Daily  . potassium chloride  20 mEq  Oral Once  .  predniSONE  10 mg Oral Q breakfast  . pregabalin  75 mg Oral BID  . sertraline  200 mg Oral QHS  . terazosin  1 mg Oral QHS   Continuous Infusions: . sodium chloride 250 mL (08/26/19 0830)  . magnesium sulfate bolus IVPB       LOS: 10 days    Time spent: 35 min    Nicolette Bang, MD [facility]  If 7PM-7AM, please contact night-coverage  08/28/2019, 10:54 AM

## 2019-08-28 NOTE — Plan of Care (Signed)
  Problem: Education: Goal: Knowledge of General Education information will improve Description: Including pain rating scale, medication(s)/side effects and non-pharmacologic comfort measures Outcome: Progressing  Problem: Education: Goal: Knowledge of General Education information will improve Description: Including pain rating scale, medication(s)/side effects and non-pharmacologic comfort measures Outcome: Progressing  Pt understands POC.  She has no further questions at the moment.  Will continue to offer support and answer questions appropriately.    Problem: Clinical Measurements: Goal: Ability to maintain clinical measurements within normal limits will improve Outcome: Progressing Pt was hypertensive this shift.  Pt denies HA, chest pain, dizziness, etc.  Her anti-hypertensive meds were administered per MD's orders.  Pt's was also hypokalemic this shift and was supplemented for her electrolyte imbalance.  Will continue to monitor and assess.    Problem: Clinical Measurements: Goal: Will remain free from infection Outcome: Progressing S/Sx of infection monitor and assessed q-shift/ PRN,along with VS.  Pt has remained afebrile thus far.  Will continue to monitor and assess.    Problem: Clinical Measurements: Goal: Respiratory complications will improve Outcome: Progressing Respiratory status monitor and assessed q-shift/ PRN, along with VS.  Pt is on RA with O2 saturations at 96% and respiration rate of 20 breaths per minute.  She does not endorse SOB or DOE.  Will continue to monitor and assess.    Problem: Clinical Measurements: Goal: Cardiovascular complication will be avoided Outcome: Progressing Pt was hypertensive this shift.  Pt denies HA, chest pain, dizziness, etc.  Her anti-hypertensive meds were administered per MD's orders.  Will continue to monitor and assess.    Problem: Activity: Goal: Risk for activity intolerance will decrease Outcome: Progressing Pt is able to  ambulate to the bathroom with standby assist.  She is able to move freely in the bed and does not RN staff's assistance to help reposition herself.  Will continue to monitor and assess.    Problem: Nutrition: Goal: Adequate nutrition will be maintained Outcome: Progressing Pt is on acarb control diet and is able to tolerate is well.  Will continue to monitor and assess.    Problem: Coping: Goal: Level of anxiety will decrease Outcome: Progressing Pt has express c/o anxiety r/t her hospitalization.  Anti-anxiety med given per MD's orders.  Will continue to offer support.   Problem: Elimination: Goal: Will not experience complications related to urinary retention Outcome: Progressing Pt is a standby assist to the BR.  She is on strict I/O's.  Will continue to monitor and assess.    Problem: Pain Managment: Goal: General experience of comfort will improve Outcome: Progressing Pt has denied pain thus far.  Will continue to monitor and assess.    Problem: Skin Integrity: Goal: Risk for impaired skin integrity will decrease Outcome: Progressing Skin integrity monitored and assessed q-shift/ PRN, along with VS.  Instructed pt to turn q2 hours to prevent further skin impairment.  Tubes and drains assessed for device related pressures sores.  Will continue to monitor and assess.

## 2019-08-28 NOTE — Consult Note (Signed)
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  Goal of Therapy:  Electrolytes WNL  Plan:  K 3.4  Mag 1.8  Scr 0.63  Currently on oral KCl 20 mEq daily: continue  Will order KCL 20 meq PO x1 additional today  Will order Magnesium 1 gram IV x1  Electrolytes in am  Noralee Space ,PharmD Clinical Pharmacist 08/28/2019 8:13 AM

## 2019-08-28 NOTE — Progress Notes (Signed)
Gypsy Lore Ozment A and O X 4. VSS. Pt tolerating diet well. No complaints of pain or nausea. IV removed intact, prescriptions given. Pt voiced understanding of discharge instructions with no further questions. Pt discharged via wheelchair with RN.   Allergies as of 08/28/2019      Reactions   Ace Inhibitors Swelling   Angiotensin Receptor Blockers Swelling   Sulfa Antibiotics Itching   Chlorthalidone Rash   Flexeril [cyclobenzaprine] Rash      Medication List    STOP taking these medications   loperamide 2 MG capsule Commonly known as: IMODIUM   OLANZapine 10 MG tablet Commonly known as: ZYPREXA     TAKE these medications   acetaminophen 325 MG tablet Commonly known as: Tylenol Take 2 tablets (650 mg total) by mouth every 8 (eight) hours as needed for mild pain.   albuterol 108 (90 Base) MCG/ACT inhaler Commonly known as: VENTOLIN HFA Inhale 2 puffs into the lungs every 6 (six) hours as needed for wheezing or shortness of breath.   amLODipine 5 MG tablet Commonly known as: NORVASC Take 5 mg by mouth daily with lunch. What changed: Another medication with the same name was removed. Continue taking this medication, and follow the directions you see here.   benzonatate 200 MG capsule Commonly known as: TESSALON Take 200 mg by mouth 3 (three) times daily as needed for cough.   Breo Ellipta 200-25 MCG/INH Aepb Generic drug: fluticasone furoate-vilanterol Inhale 1 puff into the lungs daily. What changed: when to take this   cloNIDine 0.2 MG tablet Commonly known as: CATAPRES Take 0.2 mg by mouth 2 (two) times daily.   docusate sodium 100 MG capsule Commonly known as: Colace Take 1 capsule (100 mg total) by mouth 2 (two) times daily as needed for up to 10 days for mild constipation.   hydrALAZINE 100 MG tablet Commonly known as: APRESOLINE Take 1 tablet (100 mg total) by mouth 3 (three) times daily.   HYDROcodone-acetaminophen 5-325 MG tablet Commonly known as:  Norco Take 1 tablet by mouth every 6 (six) hours as needed for up to 6 doses for moderate pain.   hydrOXYzine 10 MG tablet Commonly known as: ATARAX/VISTARIL Take 1 tablet (10 mg total) by mouth 3 (three) times daily as needed for itching.   ibuprofen 800 MG tablet Commonly known as: ADVIL Take 1 tablet (800 mg total) by mouth every 8 (eight) hours as needed for mild pain or moderate pain.   lidocaine-prilocaine cream Commonly known as: EMLA Apply 1 application topically as directed. Place small amt over port site 1 hour before coming in cancer center for port flush   loratadine 10 MG tablet Commonly known as: Claritin Take 1 tablet (10 mg total) by mouth daily. What changed: when to take this   lovastatin 40 MG tablet Commonly known as: MEVACOR Take 1 tablet (40 mg total) by mouth at bedtime.   meclizine 12.5 MG tablet Commonly known as: ANTIVERT Take 1 tablet (12.5 mg total) by mouth 3 (three) times daily as needed for dizziness.   metoprolol succinate 50 MG 24 hr tablet Commonly known as: TOPROL-XL Take 50 mg by mouth daily with lunch. Take with or immediately following a meal. What changed: Another medication with the same name was removed. Continue taking this medication, and follow the directions you see here.   mirtazapine 15 MG tablet Commonly known as: REMERON Take 15 mg by mouth at bedtime.   montelukast 10 MG tablet Commonly known as: SINGULAIR Take  1 tablet (10 mg total) by mouth at bedtime as needed. What changed: when to take this   omeprazole 40 MG capsule Commonly known as: PRILOSEC Take 1 capsule (40 mg total) by mouth 2 (two) times daily. What changed:   when to take this  reasons to take this   ondansetron 8 MG tablet Commonly known as: Zofran Take 1 tablet (8 mg total) by mouth every 8 (eight) hours as needed for nausea. Start on day 3 after carboplatin chemo.   potassium chloride SA 20 MEQ tablet Commonly known as: KLOR-CON Take 1 tablet  (20 mEq total) by mouth 2 (two) times daily. What changed: when to take this   predniSONE 10 MG tablet Commonly known as: DELTASONE Take 1 tablet (10 mg total) by mouth daily with breakfast for 3 days. Start taking on: August 29, 2019   pregabalin 50 MG capsule Commonly known as: Lyrica Take 1 capsule (50 mg total) by mouth at bedtime for 30 days, THEN 1 capsule (50 mg total) 2 (two) times daily. Start taking on: July 29, 2019   sertraline 100 MG tablet Commonly known as: ZOLOFT Take 2 tablets (200 mg total) by mouth at bedtime.   terazosin 1 MG capsule Commonly known as: HYTRIN Take 1 capsule (1 mg total) by mouth at bedtime.   tiZANidine 4 MG tablet Commonly known as: Zanaflex Take 1 tablet (4 mg total) by mouth 2 (two) times daily as needed for muscle spasms. What changed: when to take this       Vitals:   08/28/19 0439 08/28/19 1356  BP: (!) 192/61 (!) 168/69  Pulse: 94 89  Resp: 20 18  Temp: 98.8 F (37.1 C) 100.3 F (37.9 C)  SpO2: 95% 98%    Gloria Allen

## 2019-08-29 NOTE — Discharge Summary (Addendum)
Physician Discharge Summary  Patient ID: Gloria Allen MRN: [id number] DOB/AGE: 06-28-1946 73 y.o.  Admit date: 08/18/2019 Discharge date: 08/28/2019  Admission Diagnoses: Ileostomy takedown, history of lung cancer,  Discharge Diagnoses:  Same as above, radiation pneumonitis  Discharged Condition: good  Hospital Course: Admitted for ileostomy takedown.  Noted to have persistent fever, thought to be infectious etiology so started on antibiotics, but further work-up including CT chest noted possible radiation pneumonitis, for which she was started on steroids.  Fevers eventually broke, and patient recovered well.  At time of discharge she was tolerating a diet, pain controlled, and white count continue to trend down although though remained elevated.  This is likely due from her steroid use.  She will follow up with her primary as well as the surgical service for close outpatient follow-up.  Consults: pulmonary/intensive care and ID  Discharge Exam: Blood pressure (!) 168/69, pulse 89, temperature 100.3 F (37.9 C), temperature source Oral, resp. rate 18, height 5\' 5"  (1.651 m), weight 70.9 kg, SpO2 98 %. General appearance: alert, cooperative and no distress GI: soft, non-tender; bowel sounds normal; no masses,  no organomegaly ileostomy site with no evidence of purulent discharge, induration, and erythema, to indicate any infection  Disposition:  Discharge disposition: 01-Home or Self Care       Discharge Instructions    Discharge patient   Complete by: As directed    Discharge disposition: 01-Home or Self Care   Discharge patient date: 08/28/2019     Allergies as of 08/28/2019      Reactions   Ace Inhibitors Swelling   Angiotensin Receptor Blockers Swelling   Sulfa Antibiotics Itching   Chlorthalidone Rash   Flexeril [cyclobenzaprine] Rash      Medication List    STOP taking these medications   loperamide 2 MG capsule Commonly known as: IMODIUM   OLANZapine 10 MG  tablet Commonly known as: ZYPREXA     TAKE these medications   acetaminophen 325 MG tablet Commonly known as: Tylenol Take 2 tablets (650 mg total) by mouth every 8 (eight) hours as needed for mild pain.   albuterol 108 (90 Base) MCG/ACT inhaler Commonly known as: VENTOLIN HFA Inhale 2 puffs into the lungs every 6 (six) hours as needed for wheezing or shortness of breath.   amLODipine 5 MG tablet Commonly known as: NORVASC Take 5 mg by mouth daily with lunch. What changed: Another medication with the same name was removed. Continue taking this medication, and follow the directions you see here.   benzonatate 200 MG capsule Commonly known as: TESSALON Take 200 mg by mouth 3 (three) times daily as needed for cough.   Breo Ellipta 200-25 MCG/INH Aepb Generic drug: fluticasone furoate-vilanterol Inhale 1 puff into the lungs daily. What changed: when to take this   cloNIDine 0.2 MG tablet Commonly known as: CATAPRES Take 0.2 mg by mouth 2 (two) times daily.   docusate sodium 100 MG capsule Commonly known as: Colace Take 1 capsule (100 mg total) by mouth 2 (two) times daily as needed for up to 10 days for mild constipation.   hydrALAZINE 100 MG tablet Commonly known as: APRESOLINE Take 1 tablet (100 mg total) by mouth 3 (three) times daily.   HYDROcodone-acetaminophen 5-325 MG tablet Commonly known as: Norco Take 1 tablet by mouth every 6 (six) hours as needed for up to 6 doses for moderate pain.   hydrOXYzine 10 MG tablet Commonly known as: ATARAX/VISTARIL Take 1 tablet (10 mg total) by mouth  3 (three) times daily as needed for itching.   ibuprofen 800 MG tablet Commonly known as: ADVIL Take 1 tablet (800 mg total) by mouth every 8 (eight) hours as needed for mild pain or moderate pain.   lidocaine-prilocaine cream Commonly known as: EMLA Apply 1 application topically as directed. Place small amt over port site 1 hour before coming in cancer center for port flush    loratadine 10 MG tablet Commonly known as: Claritin Take 1 tablet (10 mg total) by mouth daily. What changed: when to take this   lovastatin 40 MG tablet Commonly known as: MEVACOR Take 1 tablet (40 mg total) by mouth at bedtime.   meclizine 12.5 MG tablet Commonly known as: ANTIVERT Take 1 tablet (12.5 mg total) by mouth 3 (three) times daily as needed for dizziness.   metoprolol succinate 50 MG 24 hr tablet Commonly known as: TOPROL-XL Take 50 mg by mouth daily with lunch. Take with or immediately following a meal. What changed: Another medication with the same name was removed. Continue taking this medication, and follow the directions you see here.   mirtazapine 15 MG tablet Commonly known as: REMERON Take 15 mg by mouth at bedtime.   montelukast 10 MG tablet Commonly known as: SINGULAIR Take 1 tablet (10 mg total) by mouth at bedtime as needed. What changed: when to take this   omeprazole 40 MG capsule Commonly known as: PRILOSEC Take 1 capsule (40 mg total) by mouth 2 (two) times daily. What changed:   when to take this  reasons to take this   ondansetron 8 MG tablet Commonly known as: Zofran Take 1 tablet (8 mg total) by mouth every 8 (eight) hours as needed for nausea. Start on day 3 after carboplatin chemo.   potassium chloride SA 20 MEQ tablet Commonly known as: KLOR-CON Take 1 tablet (20 mEq total) by mouth 2 (two) times daily. What changed: when to take this   predniSONE 10 MG tablet Commonly known as: DELTASONE Take 1 tablet (10 mg total) by mouth daily with breakfast for 3 days.   pregabalin 50 MG capsule Commonly known as: Lyrica Take 1 capsule (50 mg total) by mouth at bedtime for 30 days, THEN 1 capsule (50 mg total) 2 (two) times daily. Start taking on: July 29, 2019   sertraline 100 MG tablet Commonly known as: ZOLOFT Take 2 tablets (200 mg total) by mouth at bedtime.   terazosin 1 MG capsule Commonly known as: HYTRIN Take 1 capsule  (1 mg total) by mouth at bedtime.     ASK your doctor about these medications   tiZANidine 4 MG tablet Commonly known as: Zanaflex Take 1 tablet (4 mg total) by mouth 2 (two) times daily as needed for muscle spasms. Ask about: Should I take this medication?      Follow-up Information    Schedule an appointment as soon as possible for a visit with [employer].   Why: hospital follow up appointment for HTN wihthin 3-5 days of discharge will need to call monday for appointment Contact information: [place] [address] [phone]        Olean Ree, MD. Schedule an appointment as soon as possible for a visit on 09/04/2019.   Specialty: General Surgery Why: for wound check and staple removalwill need to call monday to set up appointment Contact information: [address] [place] [address] [phone]  Total time spent arranging discharge was >46min. Signed: Benjamine Sprague 08/29/2019, 11:59 AM

## 2019-08-30 LAB — CULTURE, BLOOD (ROUTINE X 2)
Culture: NO GROWTH
Culture: NO GROWTH
Special Requests: ADEQUATE
Special Requests: ADEQUATE

## 2019-08-31 ENCOUNTER — Other Ambulatory Visit: Payer: Self-pay

## 2019-09-01 ENCOUNTER — Other Ambulatory Visit: Payer: Self-pay

## 2019-09-01 ENCOUNTER — Inpatient Hospital Stay: Payer: Medicare Other | Attending: Oncology | Admitting: *Deleted

## 2019-09-01 ENCOUNTER — Ambulatory Visit
Admission: RE | Admit: 2019-09-01 | Discharge: 2019-09-01 | Disposition: A | Discharge status: Home / Self Care | Payer: Medicare Other | Source: Ambulatory Visit | Attending: Oncology | Admitting: Oncology

## 2019-09-01 DIAGNOSIS — I10 Essential (primary) hypertension: Secondary | ICD-10-CM | POA: Diagnosis not present

## 2019-09-01 DIAGNOSIS — Z933 Colostomy status: Secondary | ICD-10-CM | POA: Insufficient documentation

## 2019-09-01 DIAGNOSIS — Z9049 Acquired absence of other specified parts of digestive tract: Secondary | ICD-10-CM | POA: Insufficient documentation

## 2019-09-01 DIAGNOSIS — Z923 Personal history of irradiation: Secondary | ICD-10-CM | POA: Insufficient documentation

## 2019-09-01 DIAGNOSIS — C189 Malignant neoplasm of colon, unspecified: Secondary | ICD-10-CM | POA: Diagnosis not present

## 2019-09-01 DIAGNOSIS — Z9221 Personal history of antineoplastic chemotherapy: Secondary | ICD-10-CM | POA: Diagnosis not present

## 2019-09-01 DIAGNOSIS — G8929 Other chronic pain: Secondary | ICD-10-CM | POA: Insufficient documentation

## 2019-09-01 DIAGNOSIS — C187 Malignant neoplasm of sigmoid colon: Secondary | ICD-10-CM

## 2019-09-01 DIAGNOSIS — I6782 Cerebral ischemia: Secondary | ICD-10-CM | POA: Diagnosis not present

## 2019-09-01 DIAGNOSIS — R600 Localized edema: Secondary | ICD-10-CM | POA: Diagnosis not present

## 2019-09-01 DIAGNOSIS — R519 Headache, unspecified: Secondary | ICD-10-CM | POA: Diagnosis not present

## 2019-09-01 DIAGNOSIS — I700 Atherosclerosis of aorta: Secondary | ICD-10-CM | POA: Insufficient documentation

## 2019-09-01 DIAGNOSIS — J189 Pneumonia, unspecified organism: Secondary | ICD-10-CM | POA: Diagnosis not present

## 2019-09-01 DIAGNOSIS — M5137 Other intervertebral disc degeneration, lumbosacral region: Secondary | ICD-10-CM | POA: Diagnosis not present

## 2019-09-01 DIAGNOSIS — R109 Unspecified abdominal pain: Secondary | ICD-10-CM | POA: Insufficient documentation

## 2019-09-01 DIAGNOSIS — Z79899 Other long term (current) drug therapy: Secondary | ICD-10-CM | POA: Insufficient documentation

## 2019-09-01 DIAGNOSIS — F329 Major depressive disorder, single episode, unspecified: Secondary | ICD-10-CM | POA: Diagnosis not present

## 2019-09-01 DIAGNOSIS — Z803 Family history of malignant neoplasm of breast: Secondary | ICD-10-CM | POA: Insufficient documentation

## 2019-09-01 DIAGNOSIS — Z8249 Family history of ischemic heart disease and other diseases of the circulatory system: Secondary | ICD-10-CM | POA: Insufficient documentation

## 2019-09-01 DIAGNOSIS — K7689 Other specified diseases of liver: Secondary | ICD-10-CM | POA: Insufficient documentation

## 2019-09-01 DIAGNOSIS — R188 Other ascites: Secondary | ICD-10-CM | POA: Diagnosis not present

## 2019-09-01 DIAGNOSIS — Z87891 Personal history of nicotine dependence: Secondary | ICD-10-CM | POA: Diagnosis not present

## 2019-09-01 DIAGNOSIS — C3490 Malignant neoplasm of unspecified part of unspecified bronchus or lung: Secondary | ICD-10-CM | POA: Insufficient documentation

## 2019-09-01 DIAGNOSIS — J90 Pleural effusion, not elsewhere classified: Secondary | ICD-10-CM | POA: Insufficient documentation

## 2019-09-01 DIAGNOSIS — Z882 Allergy status to sulfonamides status: Secondary | ICD-10-CM | POA: Insufficient documentation

## 2019-09-01 DIAGNOSIS — N281 Cyst of kidney, acquired: Secondary | ICD-10-CM | POA: Insufficient documentation

## 2019-09-01 DIAGNOSIS — K760 Fatty (change of) liver, not elsewhere classified: Secondary | ICD-10-CM | POA: Diagnosis not present

## 2019-09-01 DIAGNOSIS — C3411 Malignant neoplasm of upper lobe, right bronchus or lung: Secondary | ICD-10-CM | POA: Diagnosis not present

## 2019-09-01 DIAGNOSIS — M549 Dorsalgia, unspecified: Secondary | ICD-10-CM | POA: Insufficient documentation

## 2019-09-01 DIAGNOSIS — C19 Malignant neoplasm of rectosigmoid junction: Secondary | ICD-10-CM | POA: Diagnosis not present

## 2019-09-01 LAB — COMPREHENSIVE METABOLIC PANEL
ALT: 31 U/L (ref 0–44)
AST: 21 U/L (ref 15–41)
Albumin: 3.2 g/dL — ABNORMAL LOW (ref 3.5–5.0)
Alkaline Phosphatase: 106 U/L (ref 38–126)
Anion gap: 9 (ref 5–15)
BUN: 22 mg/dL (ref 8–23)
CO2: 23 mmol/L (ref 22–32)
Calcium: 9.2 mg/dL (ref 8.9–10.3)
Chloride: 108 mmol/L (ref 98–111)
Creatinine, Ser: 0.63 mg/dL (ref 0.44–1.00)
GFR calc Af Amer: >60 mL/min (ref >60)
GFR calc non Af Amer: >60 mL/min (ref >60)
Glucose, Bld: 122 mg/dL — ABNORMAL HIGH (ref 70–99)
Potassium: 3.7 mmol/L (ref 3.5–5.1)
Sodium: 140 mmol/L (ref 135–145)
Total Bilirubin: 0.3 mg/dL (ref 0.3–1.2)
Total Protein: 7.5 g/dL (ref 6.5–8.1)

## 2019-09-01 LAB — CBC WITH DIFFERENTIAL/PLATELET
Abs Immature Granulocytes: 0.1 10*3/uL — ABNORMAL HIGH (ref 0.00–0.07)
Basophils Absolute: 0.1 10*3/uL (ref 0.0–0.1)
Basophils Relative: 0 %
Eosinophils Absolute: 0.5 10*3/uL (ref 0.0–0.5)
Eosinophils Relative: 4 %
HCT: 30.6 % — ABNORMAL LOW (ref 36.0–46.0)
Hemoglobin: 9.5 g/dL — ABNORMAL LOW (ref 12.0–15.0)
Immature Granulocytes: 1 %
Lymphocytes Relative: 9 %
Lymphs Abs: 1.3 10*3/uL (ref 0.7–4.0)
MCH: 28 pg (ref 26.0–34.0)
MCHC: 31 g/dL (ref 30.0–36.0)
MCV: 90.3 fL (ref 80.0–100.0)
Monocytes Absolute: 0.9 10*3/uL (ref 0.1–1.0)
Monocytes Relative: 6 %
Neutro Abs: 10.9 10*3/uL — ABNORMAL HIGH (ref 1.7–7.7)
Neutrophils Relative %: 80 %
Platelets: 552 10*3/uL — ABNORMAL HIGH (ref 150–400)
RBC: 3.39 MIL/uL — ABNORMAL LOW (ref 3.87–5.11)
RDW: 13.7 % (ref 11.5–15.5)
WBC: 13.8 10*3/uL — ABNORMAL HIGH (ref 4.0–10.5)
nRBC: 0 % (ref 0.0–0.2)

## 2019-09-01 MED ORDER — GADOBUTROL 1 MMOL/ML IV SOLN
6.0000 mL | Freq: Once | INTRAVENOUS | Status: AC | PRN
  Administered 2019-09-01: 6 mL via INTRAVENOUS

## 2019-09-02 LAB — CEA: CEA: 5.6 ng/mL — ABNORMAL HIGH (ref 0.0–4.7)

## 2019-09-03 ENCOUNTER — Telehealth: Payer: Self-pay | Admitting: Nurse Practitioner

## 2019-09-03 ENCOUNTER — Inpatient Hospital Stay: Payer: Medicare Other

## 2019-09-03 ENCOUNTER — Other Ambulatory Visit: Payer: Self-pay

## 2019-09-03 ENCOUNTER — Ambulatory Visit
Admission: RE | Admit: 2019-09-03 | Discharge: 2019-09-03 | Disposition: A | Discharge status: Home / Self Care | Payer: Medicare Other | Source: Ambulatory Visit | Attending: Oncology | Admitting: Oncology

## 2019-09-03 DIAGNOSIS — C187 Malignant neoplasm of sigmoid colon: Secondary | ICD-10-CM | POA: Insufficient documentation

## 2019-09-03 DIAGNOSIS — C3490 Malignant neoplasm of unspecified part of unspecified bronchus or lung: Secondary | ICD-10-CM | POA: Diagnosis not present

## 2019-09-03 DIAGNOSIS — K5289 Other specified noninfective gastroenteritis and colitis: Secondary | ICD-10-CM | POA: Diagnosis not present

## 2019-09-03 MED ORDER — IOHEXOL 300 MG/ML  SOLN
100.0000 mL | Freq: Once | INTRAMUSCULAR | Status: AC | PRN
  Administered 2019-09-03: 100 mL via INTRAVENOUS

## 2019-09-03 NOTE — Telephone Encounter (Signed)
Called patient to schedule a Palliative f/u visit with NP, no answer and unable to leave a message due to mailbox is full

## 2019-09-07 ENCOUNTER — Encounter: Payer: Self-pay | Admitting: Oncology

## 2019-09-07 ENCOUNTER — Other Ambulatory Visit: Payer: Self-pay

## 2019-09-07 DIAGNOSIS — I10 Essential (primary) hypertension: Secondary | ICD-10-CM | POA: Diagnosis not present

## 2019-09-07 NOTE — Progress Notes (Signed)
Patient stated that she had been doing well with no complaints. Patient would like a refill on her medications.

## 2019-09-08 ENCOUNTER — Inpatient Hospital Stay: Payer: Medicare Other

## 2019-09-08 ENCOUNTER — Inpatient Hospital Stay (HOSPITAL_BASED_OUTPATIENT_CLINIC_OR_DEPARTMENT_OTHER): Payer: Medicare Other | Admitting: Oncology

## 2019-09-08 ENCOUNTER — Other Ambulatory Visit: Payer: Self-pay

## 2019-09-08 ENCOUNTER — Encounter: Payer: Self-pay | Admitting: Oncology

## 2019-09-08 VITALS — BP 180/106 | HR 98 | Temp 96.7°F | Resp 16 | Wt 149.3 lb

## 2019-09-08 DIAGNOSIS — G8929 Other chronic pain: Secondary | ICD-10-CM | POA: Diagnosis not present

## 2019-09-08 DIAGNOSIS — M549 Dorsalgia, unspecified: Secondary | ICD-10-CM | POA: Diagnosis not present

## 2019-09-08 DIAGNOSIS — R188 Other ascites: Secondary | ICD-10-CM | POA: Diagnosis not present

## 2019-09-08 DIAGNOSIS — K7689 Other specified diseases of liver: Secondary | ICD-10-CM | POA: Diagnosis not present

## 2019-09-08 DIAGNOSIS — Z08 Encounter for follow-up examination after completed treatment for malignant neoplasm: Secondary | ICD-10-CM

## 2019-09-08 DIAGNOSIS — Z923 Personal history of irradiation: Secondary | ICD-10-CM | POA: Diagnosis not present

## 2019-09-08 DIAGNOSIS — Z79899 Other long term (current) drug therapy: Secondary | ICD-10-CM | POA: Diagnosis not present

## 2019-09-08 DIAGNOSIS — C187 Malignant neoplasm of sigmoid colon: Secondary | ICD-10-CM

## 2019-09-08 DIAGNOSIS — C19 Malignant neoplasm of rectosigmoid junction: Secondary | ICD-10-CM | POA: Diagnosis not present

## 2019-09-08 DIAGNOSIS — R109 Unspecified abdominal pain: Secondary | ICD-10-CM | POA: Diagnosis not present

## 2019-09-08 DIAGNOSIS — M5137 Other intervertebral disc degeneration, lumbosacral region: Secondary | ICD-10-CM | POA: Diagnosis not present

## 2019-09-08 DIAGNOSIS — Z85038 Personal history of other malignant neoplasm of large intestine: Secondary | ICD-10-CM

## 2019-09-08 DIAGNOSIS — Z85118 Personal history of other malignant neoplasm of bronchus and lung: Secondary | ICD-10-CM | POA: Diagnosis not present

## 2019-09-08 DIAGNOSIS — Z87891 Personal history of nicotine dependence: Secondary | ICD-10-CM | POA: Diagnosis not present

## 2019-09-08 DIAGNOSIS — R600 Localized edema: Secondary | ICD-10-CM | POA: Diagnosis not present

## 2019-09-08 DIAGNOSIS — I6782 Cerebral ischemia: Secondary | ICD-10-CM | POA: Diagnosis not present

## 2019-09-08 DIAGNOSIS — K760 Fatty (change of) liver, not elsewhere classified: Secondary | ICD-10-CM | POA: Diagnosis not present

## 2019-09-08 DIAGNOSIS — C3411 Malignant neoplasm of upper lobe, right bronchus or lung: Secondary | ICD-10-CM | POA: Diagnosis not present

## 2019-09-08 DIAGNOSIS — J189 Pneumonia, unspecified organism: Secondary | ICD-10-CM | POA: Diagnosis not present

## 2019-09-08 DIAGNOSIS — Z9221 Personal history of antineoplastic chemotherapy: Secondary | ICD-10-CM | POA: Diagnosis not present

## 2019-09-08 DIAGNOSIS — Z9049 Acquired absence of other specified parts of digestive tract: Secondary | ICD-10-CM | POA: Diagnosis not present

## 2019-09-08 DIAGNOSIS — C3490 Malignant neoplasm of unspecified part of unspecified bronchus or lung: Secondary | ICD-10-CM

## 2019-09-08 DIAGNOSIS — Z95828 Presence of other vascular implants and grafts: Secondary | ICD-10-CM

## 2019-09-08 DIAGNOSIS — I10 Essential (primary) hypertension: Secondary | ICD-10-CM | POA: Diagnosis not present

## 2019-09-08 DIAGNOSIS — Z882 Allergy status to sulfonamides status: Secondary | ICD-10-CM | POA: Diagnosis not present

## 2019-09-08 DIAGNOSIS — J90 Pleural effusion, not elsewhere classified: Secondary | ICD-10-CM | POA: Diagnosis not present

## 2019-09-08 DIAGNOSIS — I700 Atherosclerosis of aorta: Secondary | ICD-10-CM | POA: Diagnosis not present

## 2019-09-08 DIAGNOSIS — N281 Cyst of kidney, acquired: Secondary | ICD-10-CM | POA: Diagnosis not present

## 2019-09-08 DIAGNOSIS — R519 Headache, unspecified: Secondary | ICD-10-CM | POA: Diagnosis not present

## 2019-09-08 LAB — CBC WITH DIFFERENTIAL/PLATELET
Abs Immature Granulocytes: 0.05 10*3/uL (ref 0.00–0.07)
Basophils Absolute: 0 10*3/uL (ref 0.0–0.1)
Basophils Relative: 0 %
Eosinophils Absolute: 0.7 10*3/uL — ABNORMAL HIGH (ref 0.0–0.5)
Eosinophils Relative: 7 %
HCT: 32.1 % — ABNORMAL LOW (ref 36.0–46.0)
Hemoglobin: 9.9 g/dL — ABNORMAL LOW (ref 12.0–15.0)
Immature Granulocytes: 1 %
Lymphocytes Relative: 13 %
Lymphs Abs: 1.2 10*3/uL (ref 0.7–4.0)
MCH: 27.8 pg (ref 26.0–34.0)
MCHC: 30.8 g/dL (ref 30.0–36.0)
MCV: 90.2 fL (ref 80.0–100.0)
Monocytes Absolute: 0.5 10*3/uL (ref 0.1–1.0)
Monocytes Relative: 6 %
Neutro Abs: 6.9 10*3/uL (ref 1.7–7.7)
Neutrophils Relative %: 73 %
Platelets: 376 10*3/uL (ref 150–400)
RBC: 3.56 MIL/uL — ABNORMAL LOW (ref 3.87–5.11)
RDW: 13.5 % (ref 11.5–15.5)
WBC: 9.3 10*3/uL (ref 4.0–10.5)
nRBC: 0 % (ref 0.0–0.2)

## 2019-09-08 LAB — COMPREHENSIVE METABOLIC PANEL
ALT: 15 U/L (ref 0–44)
AST: 19 U/L (ref 15–41)
Albumin: 3.1 g/dL — ABNORMAL LOW (ref 3.5–5.0)
Alkaline Phosphatase: 100 U/L (ref 38–126)
Anion gap: 13 (ref 5–15)
BUN: 5 mg/dL — ABNORMAL LOW (ref 8–23)
CO2: 24 mmol/L (ref 22–32)
Calcium: 9 mg/dL (ref 8.9–10.3)
Chloride: 104 mmol/L (ref 98–111)
Creatinine, Ser: 0.62 mg/dL (ref 0.44–1.00)
GFR calc Af Amer: >60 mL/min (ref >60)
GFR calc non Af Amer: >60 mL/min (ref >60)
Glucose, Bld: 155 mg/dL — ABNORMAL HIGH (ref 70–99)
Potassium: 3.3 mmol/L — ABNORMAL LOW (ref 3.5–5.1)
Sodium: 141 mmol/L (ref 135–145)
Total Bilirubin: 0.3 mg/dL (ref 0.3–1.2)
Total Protein: 7.3 g/dL (ref 6.5–8.1)

## 2019-09-08 MED ORDER — BENZONATATE 200 MG PO CAPS: 200.0000 mg | ORAL_CAPSULE | Freq: Three times a day (TID) | ORAL | 0 refills | Status: DC | PRN

## 2019-09-08 MED ORDER — SODIUM CHLORIDE 0.9% FLUSH
10.0000 mL | Freq: Once | INTRAVENOUS | Status: AC
  Filled 2019-09-08: qty 10

## 2019-09-08 NOTE — Progress Notes (Signed)
Pt is in pain from surgery on right sid eof abdomen from reversing colostomy. She is dizzy and weak

## 2019-09-09 ENCOUNTER — Encounter: Payer: Self-pay | Admitting: Surgery

## 2019-09-09 ENCOUNTER — Ambulatory Visit (INDEPENDENT_AMBULATORY_CARE_PROVIDER_SITE_OTHER): Payer: Medicare Other | Admitting: Surgery

## 2019-09-09 VITALS — BP 180/90 | HR 95 | Temp 97.9°F | Ht 64.5 in | Wt 151.0 lb

## 2019-09-09 DIAGNOSIS — C187 Malignant neoplasm of sigmoid colon: Secondary | ICD-10-CM

## 2019-09-09 DIAGNOSIS — Z09 Encounter for follow-up examination after completed treatment for conditions other than malignant neoplasm: Secondary | ICD-10-CM

## 2019-09-09 NOTE — Patient Instructions (Addendum)
May use BC powder, Ibuprofen, or Tylenol for pain or discomfort.  May use a heating pad to the area for comfort.  Or may alternate between heat and cold if that works for you.   Your pain will continue to get better.   Call if your pain gets worse, you develop chills and fever, or you develop drainage at the wound site.   The steri strips will start to fall off on their own in 1-2 weeks.  No heavy lifting for 3 more weeks.   Follow-up here in 3 weeks.

## 2019-09-09 NOTE — Progress Notes (Signed)
09/09/2019  HPI: Gloria Allen is a 73 y.o. female s/p ileostomy takedown on 08/18/2019 by Dr. Dahlia Byes.  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  During her hospital course, she had pressure issues with persistent fevers and a CT scan of the abdomen pelvis did not reveal any anastomotic issues.  There was concern for possible right lung pneumonia.  However there was no improvement with antibiotics alone.  Pulmonary was consulted and after CT of the chest was done, the concern was more that she had radiation pneumonitis.  She was started on steroids and improved overall her fevers broke.  There was also concern with some fluid at the ileostomy site but after removing some staples at that time there was only old hematoma fluid without any abscess.  Patient today reports that she still having soreness in the right lower quadrant at the ileostomy site.  Otherwise has not had any breathing difficulty or fevers.  Vital signs: BP (!) 180/90   Pulse 95   Temp 97.9 F (36.6 C)   Ht 5' 4.5" (1.638 m)   Wt 151 lb (68.5 kg)   SpO2 98%   BMI 25.52 kg/m    Physical Exam: Constitutional: No acute distress Abdomen: Soft, nondistended, with mild tenderness to palpation in the right lower quadrant at the ileostomy site.  Incision is well-healed with no erythema or induration with no pain out of proportion.  Staples were removed with no complications and Steri-Strips placed.  There is really no evidence of infection at this point  Assessment/Plan: This is a 73 y.o. female s/p ileostomy closure.  -Patient had a CT scan on 12/3 with Dr. Janese Banks which did not really reveal any anastomotic leak again.  There was concern about some bubbles of gas around the anastomosis concerning for microabscess but the patient has not had any troubles with her bowels.  The fluid that was seen on prior CT scan was still there in the subcutaneous space  under the ileostomy by again on exam today she does not present clinically like any issues with anastomotic leak or with subcutaneous abscess. -Staples removed and changed to Steri-Strips.  No antibiotics are needed at this point. -Patient will follow-up in 3 weeks with Dr. Dahlia Byes.  She has restrictions of no heavy lifting or pushing nothing more than 10 to 15 pounds until then.   Melvyn Neth, [facility]

## 2019-09-10 DIAGNOSIS — I10 Essential (primary) hypertension: Secondary | ICD-10-CM | POA: Diagnosis not present

## 2019-09-10 NOTE — Progress Notes (Signed)
Hematology/Oncology Consult note [facility]  Telephone:([phone] [phone]  Patient Care Team: Inc, [facility] as PCP - General   Name of the patient: Gloria Allen  [id number]  1946/06/18   Date of visit: 09/10/19  Diagnosis- 1.Stage III colon cancer status post surgery 2.Limited stage small cell lung cancer  Chief complaint/ Reason for visit-routine follow-up of lung and colon cancer  Heme/Onc history: patient is a 73 year old African-American female who recently underwent screening colonoscopy by Dr. Bonna Gains.Colonoscopy showed villous nonobstructing large mass in the sigmoid colon 12 cm proximal to the anus. Mass was partially circumferential. Biopsy showed invasive adenocarcinoma. Patient was seen by Dr. Gretchen Short underwent hemicolectomy with lymph node sampling on 11/27/2018. Final pathology showed invasive colorectal carcinoma 3.5 cm, grade 2. Tumor invades through muscularis propria into the peri-colorectal tissue. Margins negative. Tumor deposits present, 2. 1 out of 9 lymph nodes examined was positive for malignancy. [id number].Patient was feeling poorly after surgery and she had no social support and decided against adjuvant chemotherapy.  Patient underwent CT chest to complete her staging work-up which showed 2 lung nodules one in the right upper lobe and one in the left left upper lobe.This was followed by a PET CT scan which showed with the left upper lobe nodule was not hypermetabolic.The right upper lobe lung nodule measure 1.6 cm with an SUV of 6.8. No hypermetabolic axillary mediastinal or hilar lymphadenopathy or evidence of metastatic disease elsewhere. Patient underwent CT-guided biopsy of the right upper lobe lung nodule which was consistent with small cell lung cancerpositive for CD56 and TTF-1.Patient started concurrent chemoradiation in June 2020. Carboplatin chosen instead of cisplatin with  etoposide given her age, poor social support and concern for renal dysfunction. Patient completed 4 cycles of carboplatin and etoposide on 05/19/2019   Interval history-reports fatigue.  Has ongoing dry cough which she states improves with Tessalon Perles.  Also has chronic back pain.  She did run out of her blood pressure medications about a week ago.  She was admitted to the hospital for colostomy takedown and was found to have pneumonia during that hospitalization and was given tapering dose of steroids.  Currently reports no fever  ECOG PS- 1 Pain scale- 0  Review of systems- Review of Systems  Constitutional: Positive for malaise/fatigue. Negative for chills, fever and weight loss.  HENT: Negative for congestion, ear discharge and nosebleeds.   Eyes: Negative for blurred vision.  Respiratory: Positive for cough. Negative for hemoptysis, sputum production, shortness of breath and wheezing.   Cardiovascular: Negative for chest pain, palpitations, orthopnea and claudication.  Gastrointestinal: Negative for abdominal pain, blood in stool, constipation, diarrhea, heartburn, melena, nausea and vomiting.  Genitourinary: Negative for dysuria, flank pain, frequency, hematuria and urgency.  Musculoskeletal: Negative for back pain, joint pain and myalgias.  Skin: Negative for rash.  Neurological: Negative for dizziness, tingling, focal weakness, seizures, weakness and headaches.  Endo/Heme/Allergies: Does not bruise/bleed easily.  Psychiatric/Behavioral: Negative for depression and suicidal ideas. The patient does not have insomnia.       Allergies  Allergen Reactions  . Ace Inhibitors Swelling  . Angiotensin Receptor Blockers Swelling  . Sulfa Antibiotics Itching  . Chlorthalidone Rash  . Flexeril [Cyclobenzaprine] Rash     Past Medical History:  Diagnosis Date  . Anxiety   . Arthritis   . Asthma   . Cervical central spinal stenosis (C3-C7) (worse at C4-5) 07/30/2017  . Cervical  foraminal stenosis (C4-5 and C5-6) (Bilateral) (L>R) 07/30/2017  .  Chronic lower extremity pain (Fourth Area of Pain) (Bilateral) (R>L) 06/11/2017  . Chronic neck pain (Primary Area of Pain) (Bilateral) (R>L) 06/11/2017  . Chronic sacroiliac joint pain (Bilateral) (R>L) 06/11/2017  . Chronic shoulder pain [facility] Area of Pain) (Right) 06/11/2017  . Chronic upper extremity pain (Fifth Area of Pain) (Bilateral) (R>L) 06/11/2017  . Colon cancer ([place]) 11/2018   Partial colon resection  . DDD (degenerative disc disease), cervical 07/30/2017  . DDD (degenerative disc disease), lumbar 07/30/2017  . Depression   . DISH (diffuse idiopathic skeletal hyperostosis) 07/30/2017  . Dyspnea    with exertion  . Entrapment syndrome 06/20/2017   2002 on R and 2010 on L  . Full thickness rotator cuff tear 06/12/2017  . GERD (gastroesophageal reflux disease)   . Grade 1 Anterolisthesis of L4 over L5 07/30/2017  . Headache   . Hx: UTI (urinary tract infection)   . Hypertension   . Inflammation of joint of shoulder region 01/15/2017  . Lumbar central spinal stenosis (L4-5) 07/30/2017  . Lumbar disc protrusion (Left: L5-S1) (Right: L1-2) 07/30/2017   L5-S1 left foraminal protrusion with L5 impingement. L1-2 right paracentral protrusion without impingement.  . Lumbar facet arthropathy (Bilateral) 07/30/2017  . Lumbar facet syndrome (Bilateral) (R>L) 07/30/2017  . Lung cancer ([place]) 11/2018   Chemo  and rad tx's  . Osteoarthritis of shoulder (Right) 01/15/2017     Past Surgical History:  Procedure Laterality Date  . ABDOMINAL HYSTERECTOMY    . APPENDECTOMY    . BREAST REDUCTION SURGERY Bilateral   . COLONOSCOPY WITH PROPOFOL N/A 11/05/2018   Procedure: COLONOSCOPY WITH PROPOFOL;  Surgeon: Virgel Manifold, MD;  Location: [facility] ENDOSCOPY;  Service: Endoscopy;  Laterality: N/A;  . COLONOSCOPY WITH PROPOFOL N/A 11/06/2018   Procedure: COLONOSCOPY WITH PROPOFOL;  Surgeon: Virgel Manifold, MD;  Location: [facility]  ENDOSCOPY;  Service: Endoscopy;  Laterality: N/A;  . COLONOSCOPY WITH PROPOFOL N/A 07/15/2019   Procedure: COLONOSCOPY WITH PROPOFOL;  Surgeon: Virgel Manifold, MD;  Location: [facility] ENDOSCOPY;  Service: Endoscopy;  Laterality: N/A;  . COLOSTOMY    . DILATION AND CURETTAGE OF UTERUS    . ESOPHAGOGASTRODUODENOSCOPY (EGD) WITH PROPOFOL N/A 11/05/2018   Procedure: ESOPHAGOGASTRODUODENOSCOPY (EGD) WITH PROPOFOL;  Surgeon: Virgel Manifold, MD;  Location: [facility] ENDOSCOPY;  Service: Endoscopy;  Laterality: N/A;  . ESOPHAGOGASTRODUODENOSCOPY (EGD) WITH PROPOFOL N/A 03/27/2019   Procedure: ESOPHAGOGASTRODUODENOSCOPY (EGD) WITH PROPOFOL;  Surgeon: Lucilla Lame, MD;  Location: [facility] ENDOSCOPY;  Service: Endoscopy;  Laterality: N/A;  . ILEOSTOMY Right 11/27/2018   Procedure: ILEOSTOMY;  Surgeon: Jules Husbands, MD;  Location: [facility] ORS;  Service: General;  Laterality: Right;  . ILEOSTOMY CLOSURE N/A 08/18/2019   Procedure: ILEOSTOMY TAKEDOWN;  Surgeon: Jules Husbands, MD;  Location: [facility] ORS;  Service: General;  Laterality: N/A;  . LAPAROSCOPIC SIGMOID COLECTOMY N/A 11/27/2018   Procedure: LAPAROSCOPIC SIGMOID COLECTOMY;  Surgeon: Jules Husbands, MD;  Location: [facility] ORS;  Service: General;  Laterality: N/A;  . ORIF ANKLE FRACTURE Right 11/21/2018   Procedure: OPEN REDUCTION INTERNAL FIXATION (ORIF) ANKLE FRACTURE;  Surgeon: Corky Mull, MD;  Location: [facility] ORS;  Service: Orthopedics;  Laterality: Right;  . PORT-A-CATH REMOVAL Right 08/18/2019   Procedure: REMOVAL PORT-A-CATH;  Surgeon: Jules Husbands, MD;  Location: [facility] ORS;  Service: General;  Laterality: Right;  . PORTACATH PLACEMENT N/A 03/12/2019   Procedure: INSERTION PORT-A-CATH;  Surgeon: Jules Husbands, MD;  Location: [facility] ORS;  Service: General;  Laterality: N/A;  . REDUCTION MAMMAPLASTY    .  SHOULDER ARTHROSCOPY WITH OPEN ROTATOR CUFF REPAIR AND DISTAL CLAVICLE ACROMINECTOMY Right 03/26/2017   Procedure: right shoulder arthroscopy, arthroscopic  subacromial decompression, distal clavicle excision, mini open rotator cuff repair;  Surgeon: Thornton Park, MD;  Location: [facility] ORS;  Service: Orthopedics;  Laterality: Right;    Social History   Socioeconomic History  . Marital status: Divorced    Spouse name: Not on file  . Number of children: 3  . Years of education: Not on file  . Highest education level: 11th grade  Occupational History  . Not on file  Tobacco Use  . Smoking status: Former Smoker    Packs/day: 1.00    Years: 15.00    Pack years: 15.00    Types: Cigarettes    Quit date: 2010    Years since quitting: 10.9  . Smokeless tobacco: Never Used  . Tobacco comment: quit over 30 years ago   Substance and Sexual Activity  . Alcohol use: No  . Drug use: Yes    Types: Marijuana    Comment: occ  . Sexual activity: Never  Other Topics Concern  . 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 She receives disability. She has 3 children and several grandchildren. She lives alone. She drives. Uses a walker and shower chair at baseline.    Social Determinants of Health   Financial Resource Strain: Medium Risk  . Difficulty of Paying Living Expenses: Somewhat hard  Food Insecurity: Food Insecurity Present  . Worried About Charity fundraiser in the Last Year: Sometimes true  . Ran Out of Food in the Last Year: Never true  Transportation Needs: Unmet Transportation Needs  . Lack of Transportation (Medical): Yes  . Lack of Transportation (Non-Medical): No  Physical Activity: Inactive  . Days of Exercise per Week: 0 days  . Minutes of Exercise per Session: 0 min  Stress: No Stress Concern Present  . Feeling of Stress : Not at all  Social Connections: Moderately Isolated  . Frequency of Communication with Friends and Family: Never  . Frequency of Social Gatherings with Friends and Family: Never  . Attends Religious Services: More than 4 times per year  . Active Member of Clubs  or Organizations: No  . Attends Archivist Meetings: Never  . Marital Status: Divorced  Human resources officer Violence: Not At Risk  . Fear of Current or Ex-Partner: No  . Emotionally Abused: No  . Physically Abused: No  . Sexually Abused: No    Family History  Problem Relation Age of Onset  . Hypertension Mother   . Breast cancer Mother 67  . Breast cancer Sister 83     Current Outpatient Medications:  .  acetaminophen (TYLENOL) 325 MG tablet, Take 2 tablets (650 mg total) by mouth every 8 (eight) hours as needed for mild pain., Disp: 40 tablet, Rfl: 0 .  albuterol (VENTOLIN HFA) 108 (90 Base) MCG/ACT inhaler, Inhale 2 puffs into the lungs every 6 (six) hours as needed for wheezing or shortness of breath., Disp: 18 g, Rfl: 0 .  amLODipine (NORVASC) 5 MG tablet, Take 5 mg by mouth daily with lunch. , Disp: , Rfl:  .  BREO ELLIPTA 200-25 MCG/INH AEPB, Inhale 1 puff into the lungs daily. (Patient taking differently: Inhale 1 puff into the lungs every morning. ), Disp: 60 each, Rfl: 0 .  hydrALAZINE (APRESOLINE) 100 MG tablet, Take 1 tablet (100 mg total) by mouth 3 (  three) times daily., Disp: 90 tablet, Rfl: 0 .  ibuprofen (ADVIL) 800 MG tablet, Take 1 tablet (800 mg total) by mouth every 8 (eight) hours as needed for mild pain or moderate pain., Disp: 30 tablet, Rfl: 0 .  lidocaine-prilocaine (EMLA) cream, Apply 1 application topically as directed. Place small amt over port site 1 hour before coming in cancer center for port flush, Disp: 30 g, Rfl: 1 .  lovastatin (MEVACOR) 40 MG tablet, Take 1 tablet (40 mg total) by mouth at bedtime., Disp: 30 tablet, Rfl: 0 .  metoprolol succinate (TOPROL-XL) 50 MG 24 hr tablet, Take 50 mg by mouth daily with lunch. Take with or immediately following a meal. , Disp: , Rfl:  .  potassium chloride SA (K-DUR) 20 MEQ tablet, Take 1 tablet (20 mEq total) by mouth 2 (two) times daily. (Patient taking differently: Take 20 mEq by mouth daily. ), Disp: 28  tablet, Rfl: 0 .  pregabalin (LYRICA) 50 MG capsule, Take 1 capsule (50 mg total) by mouth at bedtime for 30 days, THEN 1 capsule (50 mg total) 2 (two) times daily., Disp: 150 capsule, Rfl: 0 .  terazosin (HYTRIN) 1 MG capsule, Take 1 capsule (1 mg total) by mouth at bedtime., Disp: 30 capsule, Rfl: 0 .  benzonatate (TESSALON) 200 MG capsule, Take 1 capsule (200 mg total) by mouth 3 (three) times daily as needed for cough., Disp: 60 capsule, Rfl: 0 .  cloNIDine (CATAPRES) 0.2 MG tablet, Take 0.2 mg by mouth 2 (two) times daily., Disp: , Rfl:  .  HYDROcodone-acetaminophen (NORCO) 5-325 MG tablet, Take 1 tablet by mouth every 6 (six) hours as needed for up to 6 doses for moderate pain., Disp: 6 tablet, Rfl: 0 .  hydrOXYzine (ATARAX/VISTARIL) 10 MG tablet, Take 1 tablet (10 mg total) by mouth 3 (three) times daily as needed for itching., Disp: 30 tablet, Rfl: 0 .  loratadine (CLARITIN) 10 MG tablet, Take 1 tablet (10 mg total) by mouth daily., Disp: 30 tablet, Rfl: 0 .  meclizine (ANTIVERT) 12.5 MG tablet, Take 1 tablet (12.5 mg total) by mouth 3 (three) times daily as needed for dizziness., Disp: 30 tablet, Rfl: 0 .  mirtazapine (REMERON) 15 MG tablet, Take 15 mg by mouth at bedtime., Disp: , Rfl:  .  montelukast (SINGULAIR) 10 MG tablet, Take 1 tablet (10 mg total) by mouth at bedtime as needed., Disp: 30 tablet, Rfl: 0 .  omeprazole (PRILOSEC) 40 MG capsule, Take 1 capsule (40 mg total) by mouth 2 (two) times daily., Disp: 60 capsule, Rfl: 0 .  ondansetron (ZOFRAN) 8 MG tablet, Take 1 tablet (8 mg total) by mouth every 8 (eight) hours as needed for nausea. Start on day 3 after carboplatin chemo., Disp: 60 tablet, Rfl: 0 .  sertraline (ZOLOFT) 100 MG tablet, Take 2 tablets (200 mg total) by mouth at bedtime., Disp: 60 tablet, Rfl: 0 No current facility-administered medications for this visit.  Facility-Administered Medications Ordered in Other Visits:  .  sodium chloride flush (NS) 0.9 % injection 10  mL, 10 mL, Intravenous, PRN, Verlon Au, NP, 10 mL at 04/13/19 1430 .  sodium chloride flush (NS) 0.9 % injection 10 mL, 10 mL, Intravenous, Once, Sindy Guadeloupe, MD  Physical exam:  Vitals:   09/08/19 0953  BP: (!) 180/106  Pulse: 98  Resp: 16  Temp: (!) 96.7 F (35.9 C)  TempSrc: Tympanic  SpO2: 100%  Weight: 149 lb 4.8 oz (67.7 kg)   Physical Exam  HENT:     Head: Normocephalic and atraumatic.  Eyes:     Pupils: Pupils are equal, round, and reactive to light.  Cardiovascular:     Rate and Rhythm: Normal rate and regular rhythm.     Heart sounds: Normal heart sounds.  Pulmonary:     Effort: Pulmonary effort is normal.     Breath sounds: Normal breath sounds.  Abdominal:     General: Bowel sounds are normal.     Palpations: Abdomen is soft.  Musculoskeletal:     Cervical back: Normal range of motion.  Skin:    General: Skin is warm and dry.  Neurological:     Mental Status: She is alert and oriented to person, place, and time.      CMP Latest Ref Rng & Units 09/08/2019  Glucose 70 - 99 mg/dL 155(H)  BUN 8 - 23 mg/dL 5(L)  Creatinine 0.44 - 1.00 mg/dL 0.62  Sodium 135 - 145 mmol/L 141  Potassium 3.5 - 5.1 mmol/L 3.3(L)  Chloride 98 - 111 mmol/L 104  CO2 22 - 32 mmol/L 24  Calcium 8.9 - 10.3 mg/dL 9.0  Total Protein 6.5 - 8.1 g/dL 7.3  Total Bilirubin 0.3 - 1.2 mg/dL 0.3  Alkaline Phos 38 - 126 U/L 100  AST 15 - 41 U/L 19  ALT 0 - 44 U/L 15   CBC Latest Ref Rng & Units 09/08/2019  WBC 4.0 - 10.5 K/uL 9.3  Hemoglobin 12.0 - 15.0 g/dL 9.9(L)  Hematocrit 36.0 - 46.0 % 32.1(L)  Platelets 150 - 400 K/uL 376    No images are attached to the encounter.  DG Chest 1 View  Result Date: 08/25/2019 CLINICAL DATA:  Pneumonia. EXAM: CHEST  1 VIEW COMPARISON:  CTA of the chest 08/23/2019 FINDINGS: Heart is enlarged. There is no edema. Asymmetric right-sided airspace disease is again noted. Left lung is clear. Small right pleural effusion is again noted. IMPRESSION:  1. Stable right-sided airspace disease compatible with pneumonia. 2. Stable small right pleural effusion. 3. Stable cardiomegaly without failure. Electronically Signed   By: San Morelle M.D.   On: 08/25/2019 11:20   CT ANGIO CHEST PE W OR WO CONTRAST  Result Date: 08/23/2019 CLINICAL DATA:  Shortness of breath EXAM: CT ANGIOGRAPHY CHEST WITH CONTRAST TECHNIQUE: Multidetector CT imaging of the chest was performed using the standard protocol during bolus administration of intravenous contrast. Multiplanar CT image reconstructions and MIPs were obtained to evaluate the vascular anatomy. CONTRAST:  51mL OMNIPAQUE IOHEXOL 350 MG/ML SOLN COMPARISON:  06/05/2019 FINDINGS: Cardiovascular: Contrast injection is sufficient to demonstrate satisfactory opacification of the pulmonary arteries to the segmental level. There is no pulmonary embolus. The main pulmonary artery is dilated measuring approximately 3.5 cm. There is no CT evidence of acute right heart strain. The visualized aorta is normal. Heart size is normal, without pericardial effusion. Mediastinum/Nodes: --No mediastinal or hilar lymphadenopathy. --No axillary lymphadenopathy. --No supraclavicular lymphadenopathy. --Normal thyroid gland. --The esophagus is unremarkable Lungs/Pleura: There confluent ground-glass airspace opacities within the right upper lobe and to a lesser extent right middle and right lower lobes. There is fluid along the major and minor fissures. There is some smooth interlobular septal thickening involving the right upper lobe. There are a few peripheral subcentimeter pulmonary nodules in the right upper lobe. There is a relatively stable pulmonary opacity in the left upper lobe measuring approximately 1.8 cm (previously measuring the same). There is a small right-sided pleural effusion with adjacent atelectasis. Upper Abdomen: No acute abnormality.  Musculoskeletal: No chest wall abnormality. No acute or significant osseous  findings. There is some subcutaneous fat stranding involving the right chest wall. The this is favored to represent sequela of a recently removed right-sided Port-A-Cath. Review of the MIP images confirms the above findings. IMPRESSION: 1. Ground-glass airspace opacities and consolidation throughout the right lung field is concerning for multifocal pneumonia or aspiration in the appropriate clinical setting. 2. Small right-sided parapneumonic effusion. 3. Stable pulmonary opacity in the left upper lobe currently measuring 1.8 cm. 4. No acute pulmonary embolism. The main pulmonary artery is dilated which can be seen in patients with elevated pulmonary artery pressures. Electronically Signed   By: Constance Holster M.D.   On: 08/23/2019 17:51   MR Brain W Wo Contrast  Result Date: 09/01/2019 CLINICAL DATA:  Recent diagnosis of colon in lung cancer.  Staging. EXAM: MRI HEAD WITHOUT AND WITH CONTRAST TECHNIQUE: Multiplanar, multiecho pulse sequences of the brain and surrounding structures were obtained without and with intravenous contrast. CONTRAST:  38mL GADAVIST GADOBUTROL 1 MMOL/ML IV SOLN COMPARISON:  Head CT 08/20/2019.  MRI 02/26/2019 FINDINGS: Brain: Diffusion imaging does not show any acute or subacute infarction. No brainstem abnormality is seen. There are numerous old small vessel infarctions of the inferior cerebellum on the left as seen previously. Cerebral hemispheres show moderate chronic small-vessel ischemic changes of the white matter, similar to the previous study. No evidence of mass lesion, hemorrhage, hydrocephalus or extra-axial collection. After contrast administration, no abnormal enhancement occurs. Vascular: Major vessels at the base of the brain show flow. Skull and upper cervical spine: Negative Sinuses/Orbits: Clear/normal Other: None IMPRESSION: No acute finding. No evidence of metastatic disease. Extensive chronic small-vessel ischemic changes throughout the brain, similar to the  study earlier this year. Electronically Signed   By: Nelson Chimes M.D.   On: 09/01/2019 13:28   CT ABDOMEN PELVIS W CONTRAST  Result Date: 09/03/2019 CLINICAL DATA:  Ileostomy reversal on 07/18/2019 (postoperative day 16), possible pneumonia. History of lung cancer and colon cancer. EXAM: CT ABDOMEN AND PELVIS WITH CONTRAST TECHNIQUE: Multidetector CT imaging of the abdomen and pelvis was performed using the standard protocol following bolus administration of intravenous contrast. CONTRAST:  138mL OMNIPAQUE IOHEXOL 300 MG/ML  SOLN COMPARISON:  08/25/2019 FINDINGS: Lower chest: Progressive airspace opacity in the right lower lobe on the uppermost images. Mild cardiomegaly. Mild descending thoracic aortic atherosclerotic calcification. Hepatobiliary: Stable sharply defined homogeneous hypodense 1.2 by 0.8 cm lesion inferiorly in the right hepatic lobe on image 39/2, favoring a cyst. Nonspecific 4 mm hypodense lesion in the right hepatic lobe on image 17/2, poorly seen on the prior exam, too small to characterize. Cholecystectomy noted. No significant biliary dilatation. Pancreas: Unremarkable Spleen: Unremarkable Adrenals/Urinary Tract: Adrenal glands appear normal. Stable small hypodense lesion in the left kidney upper pole, likely cyst but technically too small to characterize. 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 Vascular calcifications along the left renal pelvis. Stomach/Bowel: Persistent subcutaneous collection of gas and fluid along the superficial fascia margin of the right rectus abdominus measuring 3.8 by 1.2 by 3.3 cm (volume = 7.9 cm^3). Appearance concerning for abscess given that it is postoperative day 16. Smaller fluid collection further caudad in the subcutaneous tissues on image 63/2. There is wall thickening in the distal ileum in the vicinity of the anastomotic site. There is no leak of contrast visible, but adjacent to the wall thickening there is continued mesenteric  stranding along with a couple of locules of extraluminal gas  within small fluid collections for example on image 64/2 and image 59/2. The larger of the 2 collections measures 1.5 by 1.0 by 1.0 cm (volume = 0.79 cm^3), and hence is likely too small to effectively drain. The patient has had a partial colectomy, and there appears to be some secondary inflammation of the distal colon as it passes near the anastomosis site and the local region of inflammation. Vascular/Lymphatic: Aortoiliac atherosclerotic vascular disease. Reproductive: Fluid density 1.8 by 1.5 cm lesion of the left ovary, stable. Uterus is absent. Other: The inflammatory stranding from the vicinity of the anastomosis tracks along the mesentery and to a lesser extent along the retroperitoneum. Musculoskeletal: Lumbar spondylosis and degenerative disc disease cause bilateral foraminal impingement at L4-5 and L5-S1. There is also grade 1 degenerative anterolisthesis at L4-5. IMPRESSION: 1. Continued inflammatory findings around the anastomosis site in the ileum, with at least 2 small extraluminal gas collections near the anastomosis probably reflecting microabscesses, as well as a approximately 8 cc collection of gas and fluid in the subcutaneous tissues along the superficial fascia margin near the original ostomy site likely reflecting a subcutaneous abscess given that we were 16 days out from the surgery. No leak of contrast or breakdown of the anastomosis is currently identified. 2. Progressive airspace opacity in the right lower lobe, suspicious for pneumonia. 3. Other imaging findings of potential clinical significance: Mild cardiomegaly. Stable small hypodense lesions in the liver and left kidney upper pole, likely cysts but technically too small to characterize. Fluid density lesion of the left ovary, likely a cyst, similar to the prior exam. 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 Lumbar spondylosis and degenerative disc  disease causing bilateral foraminal impingement at L4-5 and L5-S1. Aortic Atherosclerosis (ICD10-I70.0). Electronically Signed   By: Van Clines M.D.   On: 09/03/2019 14:04   CT ABDOMEN PELVIS W CONTRAST  Result Date: 08/25/2019 CLINICAL DATA:  Abdominal pain and fever, history of recent ileostomy reversal. EXAM: CT ABDOMEN AND PELVIS WITH CONTRAST TECHNIQUE: Multidetector CT imaging of the abdomen and pelvis was performed using the standard protocol following bolus administration of intravenous contrast. CONTRAST:  144mL OMNIPAQUE IOHEXOL 300 MG/ML  SOLN COMPARISON:  08/22/2019 FINDINGS: Lower chest: Small right pleural effusion is now seen new from the prior exam. Hepatobiliary: Fatty infiltration of the liver is noted. The gallbladder has been surgically removed. A small cyst is noted within the right lobe of the liver inferiorly. Pancreas: Unremarkable. No pancreatic ductal dilatation or surrounding inflammatory changes. Spleen: Normal in size without focal abnormality. Adrenals/Urinary Tract: Adrenal glands are stable in appearance. Kidneys demonstrate a normal enhancement pattern similar to that seen on the prior exam. Delayed images demonstrate normal excretion of contrast from both kidneys. A small less than 1 cm left renal cyst is seen. The bladder is well distended. Stomach/Bowel: The rectum is noted. There is a low anastomosis to the rectum identified with multiple edematous loops of colon proximal to this although no obstructive changes are seen. The terminal ileum is within normal limits. Previously seen fluid collection adjacent to the cecal tip is again identified best seen on image number 56 of series 2. This measures approximately 4.0 x 1.5 cm and is stable in appearance from the prior exam. Mild enhancement is noted and again may represent a small postoperative hematoma or complex seroma. The degree of gas locules in the collection have decreased in the interval from the prior exam.  Ileal anastomosis is again identified without evidence of extravasation. The  postsurgical changes in the anterior abdominal wall are stable. Fluid and air is noted. More proximal small bowel is within normal limits. Vascular/Lymphatic: Aortic atherosclerosis. No enlarged abdominal or pelvic lymph nodes. Reproductive: Status post hysterectomy. No adnexal masses. Other: No abdominal wall hernia or abnormality. No abdominopelvic ascites. Musculoskeletal: Degenerative changes of lumbar spine are seen and stable from the prior exam. IMPRESSION: New right-sided pleural effusion. Stable mildly peripherally enhancing fluid collection in the right lower quadrant adjacent to the cecal tip. This again likely represents a small postoperative hematoma or mildly complex seroma. The need for continued follow-up can be determined on a clinical basis. Changes consistent with the recent ileostomy reversal in the right anterior abdominal wall. Edematous changes are noted within the abdomen related to the recent surgery stable in appearance from the prior exam. No new focal abnormality is noted. Electronically Signed   By: Inez Catalina M.D.   On: 08/25/2019 19:49   CT ABDOMEN PELVIS W CONTRAST  Result Date: 08/22/2019 CLINICAL DATA:  Fever. Worsening abdominal pain after ileostomy reversal 4 days ago. EXAM: CT ABDOMEN AND PELVIS WITH CONTRAST TECHNIQUE: Multidetector CT imaging of the abdomen and pelvis was performed using the standard protocol following bolus administration of intravenous contrast. CONTRAST:  124mL OMNIPAQUE IOHEXOL 300 MG/ML  SOLN COMPARISON:  06/05/2019 FINDINGS: Lower chest: Patchy ground-glass attenuation noted central right lower lobe. Small right pleural effusion is associated with right base atelectasis. Hepatobiliary: Stable tiny cyst inferior right hepatic lobe. Gallbladder surgically absent. No intrahepatic or extrahepatic biliary dilation. Pancreas: No focal mass lesion. No dilatation of the main duct.  No intraparenchymal cyst. No peripancreatic edema. Spleen: No splenomegaly. No focal mass lesion. Adrenals/Urinary Tract: No adrenal nodule or mass. Tiny calcifications in the hilum of the left kidney are vascular, best appreciated on delayed imaging. 5 mm low-density lesion upper pole left kidney is too small to characterize but likely benign. Right kidney unremarkable. No evidence for hydroureter. The urinary bladder appears normal for the degree of distention. Stomach/Bowel: Stomach is unremarkable. No gastric wall thickening. No evidence of outlet obstruction. Duodenum is normally positioned as is the ligament of Treitz. No small bowel wall thickening. No small bowel dilatation. Status post ileal anastomosis right lower quadrant in the region of the distal ileum. Terminal ileum unremarkable. Oral contrast material has migrated through the ileal anastomosis and terminal ileum and is just starting to opacified the cecum. No evidence for contrast extravasation at the ileal anastomosis. The appendix is not visualized, but there is no edema or inflammation in the region of the cecum. Status post left hemicolectomy with rectal anastomosis. Vascular/Lymphatic: There is abdominal aortic atherosclerosis without aneurysm. There is no gastrohepatic or hepatoduodenal ligament lymphadenopathy. No intraperitoneal or retroperitoneal lymphadenopathy. No pelvic sidewall lymphadenopathy. Reproductive: The uterus is surgically absent. There is no adnexal mass. Other: Small collection of fluid and gas identified right lower anterior abdominal wall near the location of the ileostomy take down. This is not unexpected on postoperative day 4 and may reflect a tiny residual seroma or hematoma. There is some fluid in the anterior peritoneal cavity of the right lower quadrant around the small bowel anastomosis and cecal tip (see image 61 of series 2). This fluid has attenuation higher than would be expected for serous fluid. Several small  gas locules are seen within this fluid and there is edema/stranding in the ileocolic mesentery Musculoskeletal: Gas locules in the subcutaneous fat of the left anterior abdominal wall are likely injection sites. No worrisome lytic or sclerotic  osseous abnormality. IMPRESSION: 1. Small fluid collection anterior right lower quadrant around the distal ileal anastomosis and cecal tip. Attenuation of the fluid is higher than would be expected for serous fluid and several small gas bubbles are evident within the fluid. There may be some overlying peritoneal enhancement in this region. Postoperative day 4 would be earlier than expected for abscess and features likely reflect some residual small hematoma/blood products. Extraluminal gas is not unexpected on postoperative day 4. 2. Subcutaneous gas in small subcutaneous fluid collection in the right anterior abdominal wall at the location of ileostomy takedown. This is probably a tiny residual seroma, and again, extraluminal gas is not unexpected 4 days after surgery. 3. No evidence for bowel obstruction or anastomotic leak. The oral contrast has migrated through the ileal anastomosis into the terminal ileum and cecum. Contrast has not yet reached the distal colon although there is no evidence for fluid around the rectal anastomosis. 4. Edema/stranding in the ileocolic mesentery is presumably sequelae of recent surgery. 5. Trace right lower lobe collapse/consolidation with small right pleural effusion. 6.  Aortic Atherosclerois (ICD10-170.0) Electronically Signed   By: Misty Stanley M.D.   On: 08/22/2019 13:14   US Venous Img Lower Bilateral (DVT)  Result Date: 08/22/2019 CLINICAL DATA:  Dyspnea.  Inpatient. EXAM: BILATERAL LOWER EXTREMITY VENOUS DOPPLER ULTRASOUND TECHNIQUE: Gray-scale sonography with graded compression, as well as color Doppler and duplex ultrasound were performed to evaluate the lower extremity deep venous systems from the level of the common femoral  vein and including the common femoral, femoral, profunda femoral, popliteal and calf veins including the posterior tibial, peroneal and gastrocnemius veins when visible. The superficial great saphenous vein was also interrogated. Spectral Doppler was utilized to evaluate flow at rest and with distal augmentation maneuvers in the common femoral, femoral and popliteal veins. COMPARISON:  None. FINDINGS: RIGHT LOWER EXTREMITY Common Femoral Vein: No evidence of thrombus. Normal compressibility, respiratory phasicity and response to augmentation. Saphenofemoral Junction: No evidence of thrombus. Normal compressibility and flow on color Doppler imaging. Profunda Femoral Vein: No evidence of thrombus. Normal compressibility and flow on color Doppler imaging. Femoral Vein: No evidence of thrombus. Normal compressibility, respiratory phasicity and response to augmentation. Popliteal Vein: No evidence of thrombus. Normal compressibility, respiratory phasicity and response to augmentation. Calf Veins: No evidence of thrombus. Normal compressibility and flow on color Doppler imaging. Superficial Great Saphenous Vein: No evidence of thrombus. Normal compressibility. Venous Reflux:  None. Other Findings:  None. LEFT LOWER EXTREMITY Common Femoral Vein: No evidence of thrombus. Normal compressibility, respiratory phasicity and response to augmentation. Saphenofemoral Junction: No evidence of thrombus. Normal compressibility and flow on color Doppler imaging. Profunda Femoral Vein: No evidence of thrombus. Normal compressibility and flow on color Doppler imaging. Femoral Vein: No evidence of thrombus. Normal compressibility, respiratory phasicity and response to augmentation. Popliteal Vein: No evidence of thrombus. Normal compressibility, respiratory phasicity and response to augmentation. Calf Veins: No evidence of thrombus. Normal compressibility and flow on color Doppler imaging. Superficial Great Saphenous Vein: No evidence  of thrombus. Normal compressibility. Venous Reflux:  None. Other Findings:  None. IMPRESSION: No evidence of deep venous thrombosis in either lower extremity. Electronically Signed   By: Ilona Sorrel M.D.   On: 08/22/2019 18:33   DG ABD ACUTE 2+V W 1V CHEST  Result Date: 08/21/2019 CLINICAL DATA:  Fever, generalized weakness EXAM: DG ABDOMEN ACUTE W/ 1V CHEST COMPARISON:  04/28/2019, CT 06/05/2019 FINDINGS: Single-view chest demonstrates new ground-glass opacity and consolidation in the right  perihilar lung. Smaller focus of airspace disease in the left mid to lower lung, likely corresponding to pulmonary lung nodule. Mild cardiomegaly. No pneumothorax. Postsurgical changes in the right humeral head. Supine and upright views of the abdomen demonstrate no free air beneath the diaphragm. Postsurgical changes in the pelvis. Cutaneous staples over the right lower quadrant. Air distended probable large bowel with fluid levels in the right lower quadrant, no definite dilated small bowel. IMPRESSION: 1. New ground-glass opacity and consolidation in the right perihilar lung concerning for pneumonia. Radiographic follow-up to resolution recommended. Small focus of opacity in the left peripheral mid lung thought to correspond to a pulmonary nodule noted on prior CT 2. Mild air distended right lower quadrant colon, though without small bowel distention to suggest obstruction. These results will be called to the ordering clinician or representative by the Radiologist Assistant, and communication documented in the PACS or zVision Dashboard. Electronically Signed   By: Donavan Foil M.D.   On: 08/21/2019 21:02   ECHOCARDIOGRAM COMPLETE  Result Date: 08/24/2019   ECHOCARDIOGRAM REPORT   Patient Name:   MARCEE JACOBS Conard Date of Exam: 08/24/2019 Medical Rec #:  [id number]     Height:       65.0 in Accession #:    [id number]    Weight:       156.3 lb Date of Birth:  24-May-1946      BSA:          1.78 m Patient Age:    68 years       BP:           149/58 mmHg Patient Gender: F             HR:           92 bpm. Exam Location:  [facility] Procedure: 2D Echo, Color Doppler and Cardiac Doppler Indications:     Fever 780.6  History:         Patient has no prior history of Echocardiogram examinations.                  Signs/Symptoms:Dyspnea. Headache.  Sonographer:     Sherrie Sport RDCS (AE) Referring Phys:  [id number] DIEGO F PABON Diagnosing Phys: Kathlyn Sacramento MD IMPRESSIONS  1. Left ventricular ejection fraction, by visual estimation, is 60 to 65%. The left ventricle has normal function. There is mildly increased left ventricular hypertrophy.  2. Left ventricular diastolic parameters are consistent with Grade II diastolic dysfunction (pseudonormalization).  3. Global right ventricle has normal systolic function.The right ventricular size is normal. No increase in right ventricular wall thickness.  4. Left atrial size was mildly dilated.  5. Right atrial size was normal.  6. The mitral valve is normal in structure. No evidence of mitral valve regurgitation. No evidence of mitral stenosis.  7. The tricuspid valve is normal in structure. Tricuspid valve regurgitation is not demonstrated.  8. The aortic valve is normal in structure. Aortic valve regurgitation is not visualized. No evidence of aortic valve sclerosis or stenosis.  9. The pulmonic valve was normal in structure. Pulmonic valve regurgitation is not visualized. 10. TR signal is inadequate for assessing pulmonary artery systolic pressure. 11. No vegetations noted. FINDINGS  Left Ventricle: Left ventricular ejection fraction, by visual estimation, is 60 to 65%. The left ventricle has normal function. There is mildly increased left ventricular hypertrophy. Left ventricular diastolic parameters are consistent with Grade II diastolic dysfunction (pseudonormalization). Normal left atrial pressure. Right Ventricle: The right ventricular size  is normal. No increase in right ventricular wall thickness.  Global RV systolic function is has normal systolic function. The tricuspid regurgitant velocity is 1.68 m/s, and with an assumed right atrial pressure  of 10 mmHg, the estimated right ventricular systolic pressure is TR signal is inadequate for assessing PA pressure at 21.3 mmHg. Left Atrium: Left atrial size was mildly dilated. Right Atrium: Right atrial size was normal in size Pericardium: There is no evidence of pericardial effusion. Mitral Valve: The mitral valve is normal in structure. No evidence of mitral valve stenosis by observation. No evidence of mitral valve regurgitation. Tricuspid Valve: The tricuspid valve is normal in structure. Tricuspid valve regurgitation is not demonstrated. Aortic Valve: The aortic valve is normal in structure. Aortic valve regurgitation is not visualized. The aortic valve is structurally normal, with no evidence of sclerosis or stenosis. Aortic valve mean gradient measures 2.5 mmHg. Aortic valve peak gradient measures 7.0 mmHg. Aortic valve area, by VTI measures 3.69 cm. Pulmonic Valve: The pulmonic valve was normal in structure. Pulmonic valve regurgitation is not visualized. Aorta: The aortic root, ascending aorta and aortic arch are all structurally normal, with no evidence of dilitation or obstruction. Venous: The inferior vena cava was not well visualized. IAS/Shunts: No atrial level shunt detected by color flow Doppler. No ventricular septal defect is seen or detected. There is no evidence of an atrial septal defect.  LEFT VENTRICLE PLAX 2D LVIDd:         4.27 cm  Diastology LVIDs:         2.47 cm  LV e' lateral:   6.31 cm/s LV PW:         1.33 cm  LV E/e' lateral: 21.1 LV IVS:        0.82 cm  LV e' medial:    5.11 cm/s LVOT diam:     2.10 cm  LV E/e' medial:  26.0 LV SV:         60 ml LV SV Index:   33.05 LVOT Area:     3.46 cm  RIGHT VENTRICLE RV Basal diam:  3.52 cm RV S prime:     13.20 cm/s TAPSE (M-mode): 4.8 cm LEFT ATRIUM             Index       RIGHT ATRIUM            Index LA diam:        5.00 cm 2.81 cm/m  RA Area:     17.30 cm LA Vol (A2C):   42.5 ml 23.86 ml/m RA Volume:   41.90 ml  23.52 ml/m LA Vol (A4C):   54.2 ml 30.43 ml/m LA Biplane Vol: 48.9 ml 27.45 ml/m  AORTIC VALVE                   PULMONIC VALVE AV Area (Vmax):    2.94 cm    PV Vmax:        0.67 m/s AV Area (Vmean):   3.23 cm    PV Peak grad:   1.8 mmHg AV Area (VTI):     3.69 cm    RVOT Peak grad: 3 mmHg AV Vmax:           132.00 cm/s AV Vmean:          72.150 cm/s AV VTI:            0.196 m AV Peak Grad:      7.0 mmHg AV Mean Grad:  2.5 mmHg LVOT Vmax:         112.00 cm/s LVOT Vmean:        67.300 cm/s LVOT VTI:          0.208 m LVOT/AV VTI ratio: 1.06  AORTA Ao Root diam: 2.80 cm MITRAL VALVE                         TRICUSPID VALVE MV Area (PHT): 2.93 cm              TR Peak grad:   11.3 mmHg MV PHT:        75.11 msec            TR Vmax:        168.00 cm/s MV Decel Time: 259 msec MV E velocity: 133.00 cm/s 103 cm/s  SHUNTS MV A velocity: 112.00 cm/s 70.3 cm/s Systemic VTI:  0.21 m MV E/A ratio:  1.19        1.5       Systemic Diam: 2.10 cm  Kathlyn Sacramento MD Electronically signed by Kathlyn Sacramento MD Signature Date/Time: 08/24/2019/2:56:01 PM    Final    CT HEAD CODE STROKE WO CONTRAST`  Result Date: 08/20/2019 CLINICAL DATA:  Code stroke. Right upper extremity weakness developing of the last 90 minutes. Possible slurred speech. EXAM: CT HEAD WITHOUT CONTRAST TECHNIQUE: Contiguous axial images were obtained from the base of the skull through the vertex without intravenous contrast. COMPARISON:  02/26/2019.  04/07/2016. FINDINGS: Brain: Chronic small-vessel ischemic changes affect the cerebral hemispheric white matter. No sign of acute infarction, mass lesion, hemorrhage, hydrocephalus or extra-axial collection. Vascular: No abnormal vascular finding.  No hyperdense vessel. Skull: Negative Sinuses/Orbits: Clear/normal Other: None ASPECTS ([place] Stroke Program Early CT Score) -  Ganglionic level infarction (caudate, lentiform nuclei, internal capsule, insula, M1-M3 cortex): 7 - Supraganglionic infarction (M4-M6 cortex): 3 Total score (0-10 with 10 being normal): 10 IMPRESSION: 1. No acute finding. Chronic small-vessel ischemic changes of the white matter. 2. ASPECTS is 10 3. Call report in progress. Electronically Signed   By: Nelson Chimes M.D.   On: 08/20/2019 15:38     Assessment and plan- Patient is a 73 y.o. female with history of stage III colon cancer s/p resection, limited stage small cell lung cancer stage I s/p concurrent radiation with 4 cycles of carboplatin and etoposide currently in remission comes here for routine follow-up  1.  Limited stage small cell lung cancer: She had a recent CT angio chest on 08/23/2019.  The previously noted right upper lobe lung nodule which was biopsy-proven small cell was not visualized.  No evidence of mediastinal or axillary adenopathy.  She also has a 1.8 cm left upper lobe lung nodule which is remained stable and was not PET avid.   MRI brain was negative for metastatic disease  2.  Stage III colon cancer: S/p resection.  She recently had her colostomy takedown.Also had a repeat colonoscopy in October 2020 which showed no concerning findings and she would be getting a repeat colonoscopy after ostomy reversal in 6 to 12 months time  We did touch base with her primary care doctor regarding her elevated blood pressure and that she needs to remain on antihypertensives without any interruptions.  I will see her back in 3 months with CBC with differential and CMP  Visit Diagnosis 1. Encounter for follow-up surveillance of colon cancer   2. Encounter for follow-up surveillance of lung cancer  Dr. Randa Evens, MD, MPH [facility] [phone] 09/10/2019 11:12 AM

## 2019-09-15 ENCOUNTER — Telehealth: Payer: Self-pay | Admitting: Nurse Practitioner

## 2019-09-15 ENCOUNTER — Telehealth: Payer: Self-pay | Admitting: *Deleted

## 2019-09-15 NOTE — Telephone Encounter (Signed)
Spoke with patient regarding scheduling a Telephone Palliative f/u visit and this was set up for 10/12/18 @ 3:30 PM

## 2019-09-15 NOTE — Telephone Encounter (Signed)
Called reporting that her dizziness and nervi=ousness is no better. She reports that she has not been out of the house since last seen due to getting dizzy every time she gets up. Please Arnoldo Hooker

## 2019-09-15 NOTE — Telephone Encounter (Signed)
Notified patient to contact PCP. She repeated back to me

## 2019-09-15 NOTE — Telephone Encounter (Signed)
She needs to contact her pcp. Recent scans are all negative. This is unrelated to her malignancy. She was noted to have elevated blood pressures in our office

## 2019-09-25 DIAGNOSIS — S82841S Displaced bimalleolar fracture of right lower leg, sequela: Secondary | ICD-10-CM | POA: Diagnosis not present

## 2019-09-30 ENCOUNTER — Ambulatory Visit: Payer: Medicare Other | Admitting: Radiation Oncology

## 2019-10-01 ENCOUNTER — Telehealth: Payer: Self-pay

## 2019-10-01 NOTE — Telephone Encounter (Signed)
Patient stated that she had been having nausea and vomited since yesterday and diarrhea (3-4 times). Patient wanted to know if she could come in for fluids. I told her that I would need to speak to Dr. Janese Banks as what she recommended and then I would call her back. Patient understood. After talking to Dr. Janese Banks, due to not being able to bring patient in due to being full in our infusion department and [facility], we are not able to accommodate her at this time. Dr. Janese Banks recommended for patient to contact her PCP, go to Urgent Care or ED since she does not believe that her symptoms come from anything from her care since it had been a while that patient had not received any treatment. Patient stated that she totally understood since she knows that she is calling at the last minute. I told her to call us back if she had any further questions. Patient understood.

## 2019-10-05 ENCOUNTER — Ambulatory Visit (INDEPENDENT_AMBULATORY_CARE_PROVIDER_SITE_OTHER): Payer: Self-pay | Admitting: Surgery

## 2019-10-05 ENCOUNTER — Other Ambulatory Visit: Payer: Self-pay

## 2019-10-05 ENCOUNTER — Telehealth: Payer: Self-pay

## 2019-10-05 ENCOUNTER — Other Ambulatory Visit
Admission: RE | Admit: 2019-10-05 | Discharge: 2019-10-05 | Disposition: A | Discharge status: Home / Self Care | Payer: Medicare Other | Source: Ambulatory Visit | Attending: Surgery | Admitting: Surgery

## 2019-10-05 ENCOUNTER — Encounter: Payer: Self-pay | Admitting: Surgery

## 2019-10-05 VITALS — BP 177/101 | HR 112 | Temp 97.7°F | Ht 64.5 in | Wt 140.4 lb

## 2019-10-05 DIAGNOSIS — Z09 Encounter for follow-up examination after completed treatment for conditions other than malignant neoplasm: Secondary | ICD-10-CM

## 2019-10-05 DIAGNOSIS — C3490 Malignant neoplasm of unspecified part of unspecified bronchus or lung: Secondary | ICD-10-CM

## 2019-10-05 LAB — CBC WITH DIFFERENTIAL/PLATELET
Abs Immature Granulocytes: 0.05 10*3/uL (ref 0.00–0.07)
Basophils Absolute: 0.1 10*3/uL (ref 0.0–0.1)
Basophils Relative: 1 %
Eosinophils Absolute: 0.2 10*3/uL (ref 0.0–0.5)
Eosinophils Relative: 2 %
HCT: 39.5 % (ref 36.0–46.0)
Hemoglobin: 13.1 g/dL (ref 12.0–15.0)
Immature Granulocytes: 1 %
Lymphocytes Relative: 10 %
Lymphs Abs: 1 10*3/uL (ref 0.7–4.0)
MCH: 26.9 pg (ref 26.0–34.0)
MCHC: 33.2 g/dL (ref 30.0–36.0)
MCV: 81.1 fL (ref 80.0–100.0)
Monocytes Absolute: 0.6 10*3/uL (ref 0.1–1.0)
Monocytes Relative: 5 %
Neutro Abs: 8.7 10*3/uL — ABNORMAL HIGH (ref 1.7–7.7)
Neutrophils Relative %: 81 %
Platelets: 384 10*3/uL (ref 150–400)
RBC: 4.87 MIL/uL (ref 3.87–5.11)
RDW: 14 % (ref 11.5–15.5)
WBC: 10.7 10*3/uL — ABNORMAL HIGH (ref 4.0–10.5)
nRBC: 0 % (ref 0.0–0.2)

## 2019-10-05 LAB — COMPREHENSIVE METABOLIC PANEL
ALT: 31 U/L (ref 0–44)
AST: 32 U/L (ref 15–41)
Albumin: 4.2 g/dL (ref 3.5–5.0)
Alkaline Phosphatase: 130 U/L — ABNORMAL HIGH (ref 38–126)
Anion gap: 18 — ABNORMAL HIGH (ref 5–15)
BUN: 13 mg/dL (ref 8–23)
CO2: 24 mmol/L (ref 22–32)
Calcium: 9.7 mg/dL (ref 8.9–10.3)
Chloride: 97 mmol/L — ABNORMAL LOW (ref 98–111)
Creatinine, Ser: 0.86 mg/dL (ref 0.44–1.00)
GFR calc Af Amer: >60 mL/min (ref >60)
GFR calc non Af Amer: >60 mL/min (ref >60)
Glucose, Bld: 194 mg/dL — ABNORMAL HIGH (ref 70–99)
Potassium: 3.3 mmol/L — ABNORMAL LOW (ref 3.5–5.1)
Sodium: 139 mmol/L (ref 135–145)
Total Bilirubin: 0.7 mg/dL (ref 0.3–1.2)
Total Protein: 8.6 g/dL — ABNORMAL HIGH (ref 6.5–8.1)

## 2019-10-05 LAB — C DIFFICILE QUICK SCREEN W PCR REFLEX
C Diff antigen: NEGATIVE
C Diff interpretation: NOT DETECTED
C Diff toxin: NEGATIVE

## 2019-10-05 MED ORDER — ONDANSETRON HCL 8 MG PO TABS: 8.0000 mg | ORAL_TABLET | Freq: Three times a day (TID) | ORAL | 0 refills | Status: DC | PRN

## 2019-10-05 NOTE — Telephone Encounter (Signed)
-----   Message from Jules Husbands, MD sent at 10/05/2019  4:25 PM EST ----- Please let her know all her lab work was nml, may start taking imodium 2 mg TID for diarrhea ----- Message ----- From: Buel Ream, Lab In [place]: 10/05/2019  12:07 PM EST To: Jules Husbands, MD

## 2019-10-05 NOTE — Progress Notes (Signed)
S/p ileostomy takedown on August 18, 2019.  Patient developed postoperative fevers not explained by anything in particular despite extensive work-up and infectious disease consultation.  It was started after extensive work-up that the fever might be related to radiation pneumonitis. She has had 3 CT scans showing no evidence of anastomotic leak.  Most recent scan shows air this is likely from that area of the wound being probed with a Q-tip and air introduced but not an actual abscess or infection.  She endorses diarrhea and nausea.  She also has some dizziness.  No fevers no chills.  She is taking p.o. she does have normal urine.  PE NAD Abd: soft, incision healing well, no infection.  No peritonitis.  Significant improvement within the abdominal wall  A/P 74 year old female doing well after ileostomy takedown.  I will make sure that she does not have any C. difficile colitis. I would like to get a CBC and a CMP to check her hydration status.  She does have multiple chronic medical issues that are not related to her surgery.  She understands that she needs to make sure she follows up with her primary care physician regarding high blood pressure and dizziness.  I will see her back next week and we will also prescribe Zofran as needed.  Also encouraged patient to drink Pedialyte to maintain good hydration status

## 2019-10-05 NOTE — Patient Instructions (Signed)
Go have your lab work done today at [facility], [facility] entrance. Drink plenty of fluids and juice, broth things like that.  May take Zofran for nausea every 8 hours as needed.  Follow up here in one week.

## 2019-10-05 NOTE — Telephone Encounter (Signed)
Call to patient to notify of results and Dr Pabon's advise. Unable to leave a message as mailbox was full.

## 2019-10-06 NOTE — Telephone Encounter (Signed)
Notified patient of results. She will get the Imodium and try this. Patient instructed that if she continues to have vomiting and diarrhea she may need to go to the ER for assessment and fluids.

## 2019-10-09 ENCOUNTER — Other Ambulatory Visit: Payer: Self-pay

## 2019-10-09 ENCOUNTER — Ambulatory Visit
Admission: RE | Admit: 2019-10-09 | Discharge: 2019-10-09 | Disposition: A | Discharge status: Home / Self Care | Payer: Medicare Other | Source: Ambulatory Visit | Attending: Oncology | Admitting: Oncology

## 2019-10-09 DIAGNOSIS — C3490 Malignant neoplasm of unspecified part of unspecified bronchus or lung: Secondary | ICD-10-CM

## 2019-10-09 DIAGNOSIS — C187 Malignant neoplasm of sigmoid colon: Secondary | ICD-10-CM

## 2019-10-09 MED ORDER — IOHEXOL 300 MG/ML  SOLN
75.0000 mL | Freq: Once | INTRAMUSCULAR | Status: AC | PRN
  Administered 2019-10-09: 13:00:00 75 mL via INTRAVENOUS

## 2019-10-13 ENCOUNTER — Telehealth: Payer: Self-pay | Admitting: Nurse Practitioner

## 2019-10-13 ENCOUNTER — Other Ambulatory Visit: Payer: Medicare Other | Admitting: Nurse Practitioner

## 2019-10-13 ENCOUNTER — Other Ambulatory Visit: Payer: Self-pay

## 2019-10-13 NOTE — Telephone Encounter (Signed)
I called Gloria Allen for scheduled followup palliative telemedicine telephonic visit, no answer, unable to leave a message, will continue to try to contact.

## 2019-10-14 ENCOUNTER — Encounter: Payer: Self-pay | Admitting: Surgery

## 2019-10-14 ENCOUNTER — Other Ambulatory Visit: Payer: Self-pay

## 2019-10-14 ENCOUNTER — Ambulatory Visit (INDEPENDENT_AMBULATORY_CARE_PROVIDER_SITE_OTHER): Payer: Medicare Other | Admitting: Surgery

## 2019-10-14 ENCOUNTER — Telehealth: Payer: Self-pay | Admitting: Nurse Practitioner

## 2019-10-14 VITALS — BP 122/76 | HR 74 | Temp 95.0°F | Ht 65.0 in | Wt 149.0 lb

## 2019-10-14 DIAGNOSIS — R42 Dizziness and giddiness: Secondary | ICD-10-CM

## 2019-10-14 NOTE — Telephone Encounter (Signed)
I called Ms. Gloria Allen mobile phone (only #), no answer, unable to leave a message to reschedule f/u telemedicine pc visit

## 2019-10-14 NOTE — Progress Notes (Signed)
Surgical Consultation  10/14/2019  Gloria Allen is an 74 y.o. female.   Chief Complaint  Patient presents with  . Routine Post Op    1 week post op sx 08/18/19 Dr.Shermeka Rutt ileostomy takedown     HPI: Gloria Allen is following up.  She reports that she is doing well from abdominal perspective.  Taking p.o.  Diarrhea has significantly improved.  C. difficile has been negative.  I have reviewed her previous CBC and CMP that were pretty unremarkable.  She did have a recent CT of the chest that I have personally reviewed showing evidence of radiation changes.  Have also reviewed the echocardiogram showing normal ejection fraction.  She reports that she gets dizzy and is very very weak.  No fevers or chills.  No recent Covid exposure.  Past Medical History:  Diagnosis Date  . Anxiety   . Arthritis   . Asthma   . Cervical central spinal stenosis (C3-C7) (worse at C4-5) 07/30/2017  . Cervical foraminal stenosis (C4-5 and C5-6) (Bilateral) (L>R) 07/30/2017  . Chronic lower extremity pain (Fourth Area of Pain) (Bilateral) (R>L) 06/11/2017  . Chronic neck pain (Primary Area of Pain) (Bilateral) (R>L) 06/11/2017  . Chronic sacroiliac joint pain (Bilateral) (R>L) 06/11/2017  . Chronic shoulder pain [facility] Area of Pain) (Right) 06/11/2017  . Chronic upper extremity pain (Fifth Area of Pain) (Bilateral) (R>L) 06/11/2017  . Colon cancer ([place]) 11/2018   Partial colon resection  . DDD (degenerative disc disease), cervical 07/30/2017  . DDD (degenerative disc disease), lumbar 07/30/2017  . Depression   . DISH (diffuse idiopathic skeletal hyperostosis) 07/30/2017  . Dyspnea    with exertion  . Entrapment syndrome 06/20/2017   2002 on R and 2010 on L  . Full thickness rotator cuff tear 06/12/2017  . GERD (gastroesophageal reflux disease)   . Grade 1 Anterolisthesis of L4 over L5 07/30/2017  . Headache   . Hx: UTI (urinary tract infection)   . Hypertension   . Inflammation of joint of shoulder region  01/15/2017  . Lumbar central spinal stenosis (L4-5) 07/30/2017  . Lumbar disc protrusion (Left: L5-S1) (Right: L1-2) 07/30/2017   L5-S1 left foraminal protrusion with L5 impingement. L1-2 right paracentral protrusion without impingement.  . Lumbar facet arthropathy (Bilateral) 07/30/2017  . Lumbar facet syndrome (Bilateral) (R>L) 07/30/2017  . Lung cancer ([place]) 11/2018   Chemo  and rad tx's  . Osteoarthritis of shoulder (Right) 01/15/2017    Past Surgical History:  Procedure Laterality Date  . ABDOMINAL HYSTERECTOMY    . APPENDECTOMY    . BREAST REDUCTION SURGERY Bilateral   . COLONOSCOPY WITH PROPOFOL N/A 11/05/2018   Procedure: COLONOSCOPY WITH PROPOFOL;  Surgeon: Virgel Manifold, MD;  Location: [facility] ENDOSCOPY;  Service: Endoscopy;  Laterality: N/A;  . COLONOSCOPY WITH PROPOFOL N/A 11/06/2018   Procedure: COLONOSCOPY WITH PROPOFOL;  Surgeon: Virgel Manifold, MD;  Location: [facility] ENDOSCOPY;  Service: Endoscopy;  Laterality: N/A;  . COLONOSCOPY WITH PROPOFOL N/A 07/15/2019   Procedure: COLONOSCOPY WITH PROPOFOL;  Surgeon: Virgel Manifold, MD;  Location: [facility] ENDOSCOPY;  Service: Endoscopy;  Laterality: N/A;  . COLOSTOMY    . DILATION AND CURETTAGE OF UTERUS    . ESOPHAGOGASTRODUODENOSCOPY (EGD) WITH PROPOFOL N/A 11/05/2018   Procedure: ESOPHAGOGASTRODUODENOSCOPY (EGD) WITH PROPOFOL;  Surgeon: Virgel Manifold, MD;  Location: [facility] ENDOSCOPY;  Service: Endoscopy;  Laterality: N/A;  . ESOPHAGOGASTRODUODENOSCOPY (EGD) WITH PROPOFOL N/A 03/27/2019   Procedure: ESOPHAGOGASTRODUODENOSCOPY (EGD) WITH PROPOFOL;  Surgeon: Lucilla Lame, MD;  Location: [facility] ENDOSCOPY;  Service: Endoscopy;  Laterality: N/A;  . ILEOSTOMY Right 11/27/2018   Procedure: ILEOSTOMY;  Surgeon: Jules Husbands, MD;  Location: [facility] ORS;  Service: General;  Laterality: Right;  . ILEOSTOMY CLOSURE N/A 08/18/2019   Procedure: ILEOSTOMY TAKEDOWN;  Surgeon: Jules Husbands, MD;  Location: [facility] ORS;  Service: General;   Laterality: N/A;  . LAPAROSCOPIC SIGMOID COLECTOMY N/A 11/27/2018   Procedure: LAPAROSCOPIC SIGMOID COLECTOMY;  Surgeon: Jules Husbands, MD;  Location: [facility] ORS;  Service: General;  Laterality: N/A;  . ORIF ANKLE FRACTURE Right 11/21/2018   Procedure: OPEN REDUCTION INTERNAL FIXATION (ORIF) ANKLE FRACTURE;  Surgeon: Corky Mull, MD;  Location: [facility] ORS;  Service: Orthopedics;  Laterality: Right;  . PORT-A-CATH REMOVAL Right 08/18/2019   Procedure: REMOVAL PORT-A-CATH;  Surgeon: Jules Husbands, MD;  Location: [facility] OR[facility];  Service: General;  Laterality: Right;  . PORTACATH PLACEMENT N/A 03/12/2019   Procedure: INSERTION PORT-A-CATH;  Surgeon: Jules Husbands, MD;  Location: [facility] OR[facility];  Service: General;  Laterality: N/A;  . REDUCTION MAMMAPLASTY    . SHOULDER ARTHROSCOPY WITH OPEN ROTATOR CUFF REPAIR AND DISTAL CLAVICLE ACROMINECTOMY Right 03/26/2017   Procedure: right shoulder arthroscopy, arthroscopic subacromial decompression, distal clavicle excision, mini open rotator cuff repair;  Surgeon: Thornton Park, MD;  Location: [facility] ORS;  Service: Orthopedics;  Laterality: Right;    Family History  Problem Relation Age of Onset  . Hypertension Mother   . Breast cancer Mother 87  . Breast cancer Sister 52    Social History:  reports that she quit smoking about 11 years ago. Her smoking use included cigarettes. She has a 15.00 Allen-year smoking history. She has never used smokeless tobacco. She reports current drug use. Drug: Marijuana. She reports that she does not drink alcohol.  Allergies:  Allergies  Allergen Reactions  . Ace Inhibitors Swelling  . Angiotensin Receptor Blockers Swelling  . Sulfa Antibiotics Itching  . Chlorthalidone Rash  . Flexeril [Cyclobenzaprine] Rash    Medications reviewed.     ROS Full ROS performed and is otherwise negative other than what is stated in the HPI    BP 122/76   Pulse 74   Temp (!) 95 F (35 C) (Temporal)   Ht 5\' 5"  (1.651 m)   Wt 149 lb  (67.6 kg)   SpO2 97%   BMI 24.79 kg/m   Physical Exam Vitals and nursing note reviewed. Exam conducted with a chaperone present.  Constitutional:      Appearance: Normal appearance.  Cardiovascular:     Rate and Rhythm: Normal rate.  Pulmonary:     Effort: Pulmonary effort is normal. No respiratory distress.     Breath sounds: Normal breath sounds.  Abdominal:     General: Abdomen is flat. There is no distension.     Palpations: There is no mass.     Tenderness: There is no abdominal tenderness. There is no guarding.     Hernia: No hernia is present.  Musculoskeletal:     Cervical back: Normal range of motion and neck supple. No rigidity.  Skin:    General: Skin is warm and dry.     Capillary Refill: Capillary refill takes less than 2 seconds.  Neurological:     General: No focal deficit present.     Mental Status: She is alert and oriented to person, place, and time.  Psychiatric:        Mood and Affect: Mood normal.        Behavior: Behavior normal.  Thought Content: Thought content normal.        Judgment: Judgment normal.    Assessment/Plan: 1. Dizziness I do not have a clear etiology certainly could be due to cardiovascular events.  I have submitted a request for primary care as well as cardiology consultation.  From a surgical perspective there is really not much I can do.  Her blood pressure is well and she does not seem to be in extremities that will need immediate attention on a trip to the emergency room.  Please note that I spent at least 25 minutes in this encounter with 50% spent in coordination counseling of her care    Caroleen Hamman, MD [facility] Sur[facility]

## 2019-10-14 NOTE — Patient Instructions (Signed)
Dr. Dahlia Byes recommend patient is seen with Cardiologist Doristine Mango tomorrow 10/15/19 at 11 am.

## 2019-10-15 ENCOUNTER — Other Ambulatory Visit: Payer: Self-pay

## 2019-10-15 ENCOUNTER — Encounter: Payer: Self-pay | Admitting: Radiation Oncology

## 2019-10-15 NOTE — Progress Notes (Signed)
Patient pre screened for office appointment, no questions or concerns today. Patient reminded of upcoming appointment time and date. 

## 2019-10-16 ENCOUNTER — Other Ambulatory Visit: Payer: Self-pay

## 2019-10-16 ENCOUNTER — Other Ambulatory Visit: Payer: Self-pay | Admitting: Student in an Organized Health Care Education/Training Program

## 2019-10-16 ENCOUNTER — Inpatient Hospital Stay: Payer: Medicare Other

## 2019-10-16 ENCOUNTER — Ambulatory Visit
Admission: RE | Admit: 2019-10-16 | Discharge: 2019-10-16 | Disposition: A | Discharge status: Home / Self Care | Payer: Medicare Other | Source: Ambulatory Visit | Attending: Radiation Oncology | Admitting: Radiation Oncology

## 2019-10-16 VITALS — BP 112/65 | HR 64 | Temp 95.2°F | Resp 16 | Wt 146.4 lb

## 2019-10-16 DIAGNOSIS — G894 Chronic pain syndrome: Secondary | ICD-10-CM

## 2019-10-16 DIAGNOSIS — C3412 Malignant neoplasm of upper lobe, left bronchus or lung: Secondary | ICD-10-CM

## 2019-10-16 MED ORDER — SODIUM CHLORIDE 0.9% FLUSH
10.0000 mL | Freq: Once | INTRAVENOUS | Status: AC
  Filled 2019-10-16: qty 10

## 2019-10-16 MED ORDER — HEPARIN SOD (PORK) LOCK FLUSH 100 UNIT/ML IV SOLN
500.0000 [IU] | Freq: Once | INTRAVENOUS | Status: AC
  Filled 2019-10-16: qty 5

## 2019-10-16 NOTE — Progress Notes (Signed)
Radiation Oncology Follow up Note  Name: Gloria Allen   Date:   10/16/2019 MRN:  [id number] DOB: June 30, 1946    This 74 y.o. female presents to the clinic today for 68-month follow-up status post concurrent chemoradiation therapy for limited stage small cell lung cancer.Marland Kitchen  REFERRING PROVIDER: [facility]  HPI: Patient is a 74 year old female now out 5 months having completed concurrent chemoradiation therapy for limited stage small cell lung cancer.  She is seen today in routine follow-up is doing fairly well.  She does have difficulties with ambulation and transportation.  She is also been worked up for hypertension recently and continues to have back pain which has been a chronic problem.  She specifically denies cough hemoptysis or chest tightness..  Recent CT scan of the chest this month shows significant increase in right perihilar and paramedial fibrosis consistent with radiation fibrosis.  There is also an unchanged 2 cm nodule in the peripheral aspect of the left upper lobe which was unchanged.  This previously was nonhypermetabolic.  Her MRI of the brain previously was negative for metastatic disease.  Patient also has stage III colon cancer status post resection and she is on observation for that.  She had a repeat colonoscopy in October which was negative.  COMPLICATIONS OF TREATMENT: none  FOLLOW UP COMPLIANCE: keeps appointments   PHYSICAL EXAM:  BP 112/65   Pulse 64   Temp (!) 95.2 F (35.1 C)   Resp 16   Wt 146 lb 6.4 oz (66.4 kg)   SpO2 97%   BMI 24.36 kg/m  [place] female wheelchair-bound in NAD.  Well-developed well-nourished patient in NAD. HEENT reveals PERLA, EOMI, discs not visualized.  Oral cavity is clear. No oral mucosal lesions are identified. Neck is clear without evidence of cervical or supraclavicular adenopathy. Lungs are clear to A&P. Cardiac examination is essentially unremarkable with regular rate and rhythm without murmur rub or thrill.  Abdomen is benign with no organomegaly or masses noted. Motor sensory and DTR levels are equal and symmetric in the upper and lower extremities. Cranial nerves II through XII are grossly intact. Proprioception is intact. No peripheral adenopathy or edema is identified. No motor or sensory levels are noted. Crude visual fields are within normal range.  RADIOLOGY RESULTS: CT scans reviewed compatible with above-stated findings  PLAN: At this time patient is stable with no evidence of progressive disease.  She continues close follow-up care with medical oncology.  We have not done PCI on this woman although based on her multiple comorbidities colon cancer diagnosis feel we can hold off on PCI and treat if she does get involvement of her CNS.  I have asked to see her back in 6 months for follow-up imaging study will be performed prior to that visit.  Patient knows to call with any concerns.  I would like to take this opportunity to thank you for allowing me to participate in the care of your patient.Noreene Filbert, MD

## 2019-10-16 NOTE — Progress Notes (Signed)
Order for repeat urine drug screen placed.

## 2019-11-03 ENCOUNTER — Telehealth: Payer: Self-pay

## 2019-11-03 ENCOUNTER — Telehealth: Payer: Self-pay | Admitting: Nurse Practitioner

## 2019-11-03 NOTE — Telephone Encounter (Signed)
Spoke with patient and offered to reschedule a Telephone Palliative f/u visit, she had one scheduled for 1/12 but did not answer the phone when NP called.  I have scheduled f/u visit for 11/10/19 @ 11 AM.

## 2019-11-03 NOTE — Telephone Encounter (Signed)
Received update that patient continues to have nausea/vomiting and diarrhea. Patient reported that her skin is dry and when she pinches her skin, it remains in that position. Spoke with Christin NP who directed that patient should go to ER for IV fluids. Phone call placed to patient who shared that the nurse at the PCP office told her to go get Pedialyte. Patient plan is to try this first but if unable to keep fluids down, will go to ER.

## 2019-11-10 ENCOUNTER — Other Ambulatory Visit: Payer: Self-pay

## 2019-11-10 ENCOUNTER — Other Ambulatory Visit: Payer: Medicare Other | Admitting: Nurse Practitioner

## 2019-11-10 ENCOUNTER — Encounter: Payer: Self-pay | Admitting: Nurse Practitioner

## 2019-11-10 DIAGNOSIS — Z515 Encounter for palliative care: Secondary | ICD-10-CM

## 2019-11-10 NOTE — Progress Notes (Signed)
[facility] Palliative Care Consult Note Telephone: [phone]  Fax: [phone]  PATIENT NAME: Gloria Allen DOB: 06-18-1946 MRN: [id number]  PRIMARY CARE PROVIDER:   [name], MD  REFERRING PROVIDER:  [name], MD [address] [place],   [address]   RESPONSIBLE PARTY:   Self   Due to the COVID-19 crisis, this visit was done via telemedicine from my office and it was initiated and consent by this patient and or family.  RECOMMENDATIONS and PLAN:  1. ACP: DNR; in Epic/Vynca. Mailed a blank MOST form and hard choice book to review and will complete at next [facility] f/u visit after [name] reviews.   2. Pain secondary to Malignant neoplasm of colon s/p partial resection with ileostomy/colostomy, small cell lung cancer s/p chemotherapy/radiation will continue to monitor on pain scale, monitor efficacy vs adverse side effects. Continue with pain management appointment  3. Palliative care encounter Palliative medicine team will continue to support patient, patient's family, and medical team. Visit consisted of counseling and education dealing with the complex and emotionally intense issues of symptom management and palliative care in the setting of serious and potentially life-threatening illness  I spent 45 minutes providing this consultation,  from 11:00am to 11:45am. More than 50% of the time in this consultation was spent coordinating communication.   HISTORY OF PRESENT ILLNESS:  Gloria Allen is a 74 y.o. year old female with multiple medical problems including Malignant neoplasm of colon s/p partial resection with ileostomy/colostomy, small cell lung cancer s/p chemotherapy/radiation, hypertension, gerd, headache, degenerative disc disease, asthma, arthritis, lumbar facet syndrome, lumbar disc protrusion, anxiety, depression, PTSD, right shoulder arthroscopy, reduction mammoplasty, port-a-cath placement, right orif ankle,  appendectomy, abdominal hysterectomy, D&C of uterus. I called [name] for scheduled telemedicine, telephonic as video not available palliative care follow-up is it. We talked about palliative care visit and Ms Espe in agreement. We talked about how much atoms has been feeling. Ms Chiong endorses that it is very hard for her lately. Ms. Borunda complains of the challenges with social isolation secondary to covid-19. Recommendation was for Ms Rothery to be further evaluated by Psychiatry. Ms Leckey endorses that it is difficult for her to go to in person appointments including upcoming Psychiatry appointment. [name] does have a follow-up visit today with her primary provider to further discuss pain management. We talked about recent events including hospitalizations. We talked about coping strategies with challenges of social isolation. We talked about symptoms of shortness of breath. We talked about her appetite which has been declined. We talked about option of palliative care social worker to help to see if resources would be available under her insurance. We talked about medical goals of care. We talked about role of palliative care in plan of care. Discuss with Ms Cafaro will follow up with [facility] after visit with her primary provider and Pain Center for further exploration of medical goals of care. Ms Toothaker in agreement. Appointment schedule. Therapeutic listening and emotional support provided. Contact information. Questions answered to satisfaction. Palliative Care was asked to help to continue to address goals of care.   CODE STATUS: DNR  PPS: 60% HOSPICE ELIGIBILITY/DIAGNOSIS: TBD  PAST MEDICAL HISTORY:  Past Medical History:  Diagnosis Date  . Anxiety   . Arthritis   . Asthma   . Cervical central spinal stenosis (C3-C7) (worse at C4-5) 07/30/2017  . Cervical foraminal stenosis (C4-5 and C5-6) (Bilateral) (L>R) 07/30/2017  .  Chronic lower extremity pain (Fourth Area of Pain) (Bilateral)  (R>L) 06/11/2017  . Chronic neck pain (Primary Area of Pain) (Bilateral) (R>L) 06/11/2017  . Chronic sacroiliac joint pain (Bilateral) (R>L) 06/11/2017  . Chronic shoulder pain [facility] Area of Pain) (Right) 06/11/2017  . Chronic upper extremity pain (Fifth Area of Pain) (Bilateral) (R>L) 06/11/2017  . Colon cancer ([place]) 11/2018   Partial colon resection  . DDD (degenerative disc disease), cervical 07/30/2017  . DDD (degenerative disc disease), lumbar 07/30/2017  . Depression   . DISH (diffuse idiopathic skeletal hyperostosis) 07/30/2017  . Dyspnea    with exertion  . Entrapment syndrome 06/20/2017   2002 on R and 2010 on L  . Full thickness rotator cuff tear 06/12/2017  . GERD (gastroesophageal reflux disease)   . Grade 1 Anterolisthesis of L4 over L5 07/30/2017  . Headache   . Hx: UTI (urinary tract infection)   . Hypertension   . Inflammation of joint of shoulder region 01/15/2017  . Lumbar central spinal stenosis (L4-5) 07/30/2017  . Lumbar disc protrusion (Left: L5-S1) (Right: L1-2) 07/30/2017   L5-S1 left foraminal protrusion with L5 impingement. L1-2 right paracentral protrusion without impingement.  . Lumbar facet arthropathy (Bilateral) 07/30/2017  . Lumbar facet syndrome (Bilateral) (R>L) 07/30/2017  . Lung cancer ([place]) 11/2018   Chemo  and rad tx's  . Osteoarthritis of shoulder (Right) 01/15/2017    SOCIAL HX:  Social History   Tobacco Use  . Smoking status: Former Smoker    Packs/day: 1.00    Years: 15.00    Pack years: 15.00    Types: Cigarettes    Quit date: 2010    Years since quitting: 11.1  . Smokeless tobacco: Never Used  . Tobacco comment: quit over 30 years ago   Substance Use Topics  . Alcohol use: No    ALLERGIES:  Allergies  Allergen Reactions  . Ace Inhibitors Swelling  . Angiotensin Receptor Blockers Swelling  . Sulfa Antibiotics Itching  . Chlorthalidone Rash  . Flexeril [Cyclobenzaprine] Rash     PERTINENT MEDICATIONS:  Outpatient Encounter  Medications as of 11/10/2019  Medication Sig  . albuterol (VENTOLIN HFA) 108 (90 Base) MCG/ACT inhaler Inhale 2 puffs into the lungs every 6 (six) hours as needed for wheezing or shortness of breath.  Marland Kitchen amLODipine (NORVASC) 5 MG tablet Take 5 mg by mouth daily with lunch.   . benzonatate (TESSALON) 200 MG capsule Take 1 capsule (200 mg total) by mouth 3 (three) times daily as needed for cough.  Marland Kitchen BREO ELLIPTA 200-25 MCG/INH AEPB Inhale 1 puff into the lungs daily. (Patient taking differently: Inhale 1 puff into the lungs every morning. )  . cloNIDine (CATAPRES) 0.2 MG tablet Take 0.2 mg by mouth 2 (two) times daily.  . hydrALAZINE (APRESOLINE) 100 MG tablet Take 1 tablet (100 mg total) by mouth 3 (three) times daily.  . hydrOXYzine (ATARAX/VISTARIL) 10 MG tablet Take 1 tablet (10 mg total) by mouth 3 (three) times daily as needed for itching.  Marland Kitchen ibuprofen (ADVIL) 800 MG tablet Take 1 tablet (800 mg total) by mouth every 8 (eight) hours as needed for mild pain or moderate pain.  Marland Kitchen lidocaine-prilocaine (EMLA) cream Apply 1 application topically as directed. Place small amt over port site 1 hour before coming in cancer center for port flush  . loratadine (CLARITIN) 10 MG tablet Take 1 tablet (10 mg total) by mouth daily.  Marland Kitchen lovastatin (MEVACOR) 40 MG tablet Take 1 tablet (40 mg total) by mouth  at bedtime.  . meclizine (ANTIVERT) 12.5 MG tablet Take 1 tablet (12.5 mg total) by mouth 3 (three) times daily as needed for dizziness.  . metoprolol succinate (TOPROL-XL) 50 MG 24 hr tablet Take 50 mg by mouth daily with lunch. Take with or immediately following a meal.   . mirtazapine (REMERON) 15 MG tablet Take 15 mg by mouth at bedtime.  . montelukast (SINGULAIR) 10 MG tablet Take 1 tablet (10 mg total) by mouth at bedtime as needed.  Marland Kitchen omeprazole (PRILOSEC) 40 MG capsule Take 1 capsule (40 mg total) by mouth 2 (two) times daily.  . ondansetron (ZOFRAN) 8 MG tablet Take 1 tablet (8 mg total) by mouth every 8  (eight) hours as needed for nausea. Start on day 3 after carboplatin chemo.  . potassium chloride SA (K-DUR) 20 MEQ tablet Take 1 tablet (20 mEq total) by mouth 2 (two) times daily. (Patient taking differently: Take 20 mEq by mouth daily. )  . pregabalin (LYRICA) 50 MG capsule Take 1 capsule (50 mg total) by mouth at bedtime for 30 days, THEN 1 capsule (50 mg total) 2 (two) times daily.  . sertraline (ZOLOFT) 100 MG tablet Take 2 tablets (200 mg total) by mouth at bedtime.  Marland Kitchen terazosin (HYTRIN) 1 MG capsule Take 1 capsule (1 mg total) by mouth at bedtime.   Facility-Administered Encounter Medications as of 11/10/2019  Medication  . heparin lock flush 100 unit/mL  . sodium chloride flush (NS) 0.9 % injection 10 mL  . sodium chloride flush (NS) 0.9 % injection 10 mL  . sodium chloride flush (NS) 0.9 % injection 10 mL    PHYSICAL EXAM:   Deferred  Tuyet Bader Z Angelli Baruch, NP

## 2019-11-11 ENCOUNTER — Encounter: Payer: Self-pay | Admitting: Student in an Organized Health Care Education/Training Program

## 2019-11-11 ENCOUNTER — Telehealth: Payer: Self-pay | Admitting: *Deleted

## 2019-11-11 NOTE — Progress Notes (Signed)
Spoke with patient re; appt for tomorrow.  She is asking about psych evaluation.  States the one that we sent her to orginially will not see her because her phone does not have facetime capability.  She is asking if there is someone else that she could be referred to that might would talk to her over the phone. I told her I would check on this and let her know.

## 2019-11-12 ENCOUNTER — Other Ambulatory Visit: Payer: Self-pay

## 2019-11-12 ENCOUNTER — Ambulatory Visit
Payer: Medicare Other | Attending: Student in an Organized Health Care Education/Training Program | Admitting: Student in an Organized Health Care Education/Training Program

## 2019-11-12 ENCOUNTER — Encounter: Payer: Self-pay | Admitting: Student in an Organized Health Care Education/Training Program

## 2019-11-12 ENCOUNTER — Other Ambulatory Visit: Payer: Self-pay | Admitting: Student in an Organized Health Care Education/Training Program

## 2019-11-12 DIAGNOSIS — G894 Chronic pain syndrome: Secondary | ICD-10-CM

## 2019-11-12 NOTE — Progress Notes (Signed)
Patient has not completed repeat UDS. Encouraged patient to do so which is a requirement for ALL patients if being considered for chronic opioid therapy (COT) even for palliative purposes. Pt endorsed understanding, stating that she'll come today.

## 2019-11-16 ENCOUNTER — Telehealth: Payer: Self-pay | Admitting: Surgery

## 2019-11-16 LAB — COMPLIANCE DRUG ANALYSIS, UR

## 2019-11-16 NOTE — Telephone Encounter (Signed)
Patient is calling said she had the colostomy bag but it was reversed and said she is having a lot of gas and going to the bathroom more often and said she has odor smell everywhere she goes. Please call patient and advise.

## 2019-11-16 NOTE — Telephone Encounter (Signed)
Patient states she has a foul odor. She is passing gas. She states she was seen by [facility] and they did UA last week however she does not know the results and I cannot see the results in epic.  She was advised to follow up with PCP and DR.Lateef per Dr.Pabon. Patient stated she has been wearing a kotex and does not have any rectal leakage that could be causing the odor. Also discussed eating foods that will cause gas and to use simethicone (tums) to see if it helps relieve the gas.

## 2019-11-17 ENCOUNTER — Other Ambulatory Visit: Payer: Self-pay

## 2019-11-17 ENCOUNTER — Other Ambulatory Visit: Payer: Medicare Other | Admitting: Nurse Practitioner

## 2019-11-17 ENCOUNTER — Telehealth: Payer: Self-pay | Admitting: Nurse Practitioner

## 2019-11-17 NOTE — Telephone Encounter (Signed)
I attempted to contact Gloria Allen for schedule f/u pc visit, no answer, message left with contact information

## 2019-11-18 ENCOUNTER — Telehealth: Payer: Self-pay | Admitting: *Deleted

## 2019-11-18 NOTE — Telephone Encounter (Signed)
Attempted to call for pre appointment review of allergies/meds. Message left. 

## 2019-11-19 ENCOUNTER — Encounter: Payer: Self-pay | Admitting: Student in an Organized Health Care Education/Training Program

## 2019-11-19 ENCOUNTER — Ambulatory Visit
Payer: Medicare Other | Attending: Student in an Organized Health Care Education/Training Program | Admitting: Student in an Organized Health Care Education/Training Program

## 2019-11-19 ENCOUNTER — Other Ambulatory Visit: Payer: Self-pay

## 2019-11-19 VITALS — Ht 65.0 in | Wt 149.0 lb

## 2019-11-19 DIAGNOSIS — F332 Major depressive disorder, recurrent severe without psychotic features: Secondary | ICD-10-CM

## 2019-11-19 DIAGNOSIS — G894 Chronic pain syndrome: Secondary | ICD-10-CM

## 2019-11-19 DIAGNOSIS — M48062 Spinal stenosis, lumbar region with neurogenic claudication: Secondary | ICD-10-CM

## 2019-11-19 DIAGNOSIS — M5442 Lumbago with sciatica, left side: Secondary | ICD-10-CM | POA: Diagnosis not present

## 2019-11-19 DIAGNOSIS — Z9889 Other specified postprocedural states: Secondary | ICD-10-CM

## 2019-11-19 DIAGNOSIS — M5136 Other intervertebral disc degeneration, lumbar region: Secondary | ICD-10-CM | POA: Diagnosis not present

## 2019-11-19 DIAGNOSIS — M48061 Spinal stenosis, lumbar region without neurogenic claudication: Secondary | ICD-10-CM

## 2019-11-19 DIAGNOSIS — Z9049 Acquired absence of other specified parts of digestive tract: Secondary | ICD-10-CM

## 2019-11-19 DIAGNOSIS — F4310 Post-traumatic stress disorder, unspecified: Secondary | ICD-10-CM

## 2019-11-19 DIAGNOSIS — M5126 Other intervertebral disc displacement, lumbar region: Secondary | ICD-10-CM

## 2019-11-19 DIAGNOSIS — M47816 Spondylosis without myelopathy or radiculopathy, lumbar region: Secondary | ICD-10-CM | POA: Diagnosis not present

## 2019-11-19 DIAGNOSIS — M450 Ankylosing spondylitis of multiple sites in spine: Secondary | ICD-10-CM

## 2019-11-19 DIAGNOSIS — M5441 Lumbago with sciatica, right side: Secondary | ICD-10-CM

## 2019-11-19 DIAGNOSIS — C187 Malignant neoplasm of sigmoid colon: Secondary | ICD-10-CM

## 2019-11-19 DIAGNOSIS — G8929 Other chronic pain: Secondary | ICD-10-CM

## 2019-11-19 MED ORDER — HYDROCODONE-ACETAMINOPHEN 7.5-325 MG PO TABS: 1.0000 | ORAL_TABLET | Freq: Two times a day (BID) | ORAL | 0 refills | Status: AC | PRN

## 2019-11-19 NOTE — Progress Notes (Signed)
Patient: Gloria Allen  Service Category: E/M  Provider: Gillis Santa, MD  DOB: 1946-10-01  DOS: 11/19/2019  Location: Office  MRN: [id number]  Setting: Ambulatory outpatient  Referring Provider: [name], MD  Type: Established Patient  Specialty: Interventional Pain Management  PCP: [name], MD  Location: Home  Delivery: TeleHealth     Virtual Encounter - Pain Management PROVIDER NOTE: Information contained herein reflects review and annotations entered in association with encounter. Interpretation of such information and data should be left to medically-trained personnel. Information provided to patient can be located elsewhere in the medical record under "Patient Instructions". Document created using STT-dictation technology, any transcriptional errors that may result from process are unintentional.    Contact & Pharmacy Preferred: [phone] Home: [phone] (home) Mobile: [phone] (mobile) E-mail: [email or web address]  [facility] [place], [place] - [address] [place] [address] Phone: [phone] Fax: [place] [address] ([address]), [place] - [address] [place]) [place] [address] Phone: [phone] Fax: [phone]   Pre-screening  Gloria Allen offered "in-person" vs "virtual" encounter. She indicated preferring virtual for this encounter.   Reason COVID-19*  Social distancing based on CDC and AMA recommendations.   I contacted Gloria Allen on 11/19/2019 via telephone.      I clearly identified myself as Gillis Santa, MD. I verified that I was speaking with the correct person using two identifiers (Name: Gloria Allen, and date of birth: 21-Jan-1946).  This visit was completed via telephone due to the restrictions of the COVID-19 pandemic. All issues as above were discussed and addressed but no physical exam was performed. If it was felt that the  patient should be evaluated in the office, they were directed there. The patient verbally consented to this visit. Patient was unable to complete an audio/visual visit due to Technical difficulties and/or Lack of internet. Due to the catastrophic nature of the COVID-19 pandemic, this visit was done through audio contact only.  Location of the patient: home address (see Epic for details)  Location of the provider: office  Consent I sought verbal advanced consent from Gloria Allen for virtual visit interactions. I informed Gloria Allen of possible security and privacy concerns, risks, and limitations associated with providing "not-in-person" medical evaluation and management services. I also informed Ms. Deschepper of the availability of "in-person" appointments. Finally, I informed her that there would be a charge for the virtual visit and that she could be  personally, fully or partially, financially responsible for it. Gloria Allen expressed understanding and agreed to proceed.   Historic Elements   Gloria Allen is a 74 y.o. year old, female patient evaluated today after her last contact with our practice on 11/18/2019. Gloria Allen  has a past medical history of Anxiety, Arthritis, Asthma, Cervical central spinal stenosis (C3-C7) (worse at C4-5) (07/30/2017), Cervical foraminal stenosis (C4-5 and C5-6) (Bilateral) (L>R) (07/30/2017), Chronic lower extremity pain (Fourth Area of Pain) (Bilateral) (R>L) (06/11/2017), Chronic neck pain (Primary Area of Pain) (Bilateral) (R>L) (06/11/2017), Chronic sacroiliac joint pain (Bilateral) (R>L) (06/11/2017), Chronic shoulder pain (Tertiary Area of Pain) (Right) (06/11/2017), Chronic upper extremity pain (Fifth Area of Pain) (Bilateral) (R>L) (06/11/2017), Colon cancer ([place]) (11/2018), DDD (degenerative disc disease), cervical (07/30/2017), DDD (degenerative disc disease), lumbar (07/30/2017), Depression, DISH (diffuse idiopathic skeletal hyperostosis) (07/30/2017), Dyspnea,  Entrapment syndrome (06/20/2017), Full thickness rotator cuff tear (06/12/2017), GERD (gastroesophageal reflux disease), Grade 1  Anterolisthesis of L4 over L5 (07/30/2017), Headache, UTI (urinary tract infection), Hypertension, Inflammation of joint of shoulder region (01/15/2017), Lumbar central spinal stenosis (L4-5) (07/30/2017), Lumbar disc protrusion (Left: L5-S1) (Right: L1-2) (07/30/2017), Lumbar facet arthropathy (Bilateral) (07/30/2017), Lumbar facet syndrome (Bilateral) (R>L) (07/30/2017), Lung cancer ([place]) (11/2018), and Osteoarthritis of shoulder (Right) (01/15/2017). She also  has a past surgical history that includes Abdominal hysterectomy; Appendectomy; Breast reduction surgery (Bilateral); Dilation and curettage of uterus; Shoulder arthroscopy with open rotator cuff repair and distal clavicle acrominectomy (Right, 03/26/2017); Reduction mammaplasty; Colonoscopy with propofol (N/A, 11/05/2018); Esophagogastroduodenoscopy (egd) with propofol (N/A, 11/05/2018); Colonoscopy with propofol (N/A, 11/06/2018); ORIF ankle fracture (Right, 11/21/2018); Laparoscopic sigmoid colectomy (N/A, 11/27/2018); Ileostomy (Right, 11/27/2018); Colostomy; Portacath placement (N/A, 03/12/2019); Esophagogastroduodenoscopy (egd) with propofol (N/A, 03/27/2019); Colonoscopy with propofol (N/A, 07/15/2019); Ileostomy closure (N/A, 08/18/2019); and Port-a-cath removal (Right, 08/18/2019). Gloria Allen has a current medication list which includes the following prescription(s): albuterol, amlodipine, benzonatate, breo ellipta, clonidine, hydralazine, hydroxyzine, lidocaine-prilocaine, loratadine, lovastatin, meclizine, metoprolol succinate, montelukast, omeprazole, ondansetron, potassium chloride sa, sertraline, terazosin, trazodone, hydrocodone-acetaminophen, and mirtazapine, and the following Facility-Administered Medications: heparin lock flush, sodium chloride flush, sodium chloride flush, and sodium chloride flush. She  reports that she quit  smoking about 11 years ago. Her smoking use included cigarettes. She has a 15.00 pack-year smoking history. She has never used smokeless tobacco. She reports current drug use. Drug: Marijuana. She reports that she does not drink alcohol. Gloria Allen is allergic to ace inhibitors; angiotensin receptor blockers; sulfa antibiotics; chlorthalidone; and flexeril [cyclobenzaprine].   HPI  Today, she is being contacted for medication management.  Low back pain that goes down to the superior portion of her buttocks. Secondary to lumbar degenerative disc disease and lumbar facet pathology Has tried Gabapentin and Lyrica, not effective. Previous dose of Lyrica was 50 mg BID, no benefit at dose. Do not recommend cymbalta as pt on zoloft Only taking BC powder which she tried to avoid because of GI discomfort. Has stopped Aleve and Ibuprofen. Utilizes Lidocaine cream States Hydrocodone has helped in the past In remission from lung and colon cancer; had chemotherapy and [facility] at [facility], colon surgery 2020.   Pharmacotherapy Assessment  Analgesic:  Not on COT  Monitoring: [place] PMP: PDMP reviewed during this encounter.       Pharmacotherapy: No side-effects or adverse reactions reported. Compliance: No problems identified. Effectiveness: Clinically acceptable. Plan: Refer to "POC".  UDS:  Summary  Date Value Ref Range Status  11/12/2019 Note  Final    Comment:    ==================================================================== Compliance Drug Analysis, Ur ==================================================================== Test                             Result       Flag       Units Drug Present and Declared for Prescription Verification   Pregabalin                     PRESENT      EXPECTED   Mirtazapine                    PRESENT      EXPECTED   Sertraline                     PRESENT      EXPECTED   Desmethylsertraline            PRESENT  EXPECTED    Desmethylsertraline is  an expected metabolite of sertraline.   Clonidine                      PRESENT      EXPECTED   Metoprolol                     PRESENT      EXPECTED Drug Present not Declared for Prescription Verification   7-aminoclonazepam              96           UNEXPECTED ng/mg creat    7-aminoclonazepam is an expected metabolite of clonazepam. Source of    clonazepam is a scheduled prescription medication. Drug Absent but Declared for Prescription Verification   Cyclobenzaprine                Not Detected UNEXPECTED   Ibuprofen                      Not Detected UNEXPECTED    Ibuprofen, as indicated in the declared medication list, is not    always detected even when used as directed.   Hydroxyzine                    Not Detected UNEXPECTED   Lidocaine                      Not Detected UNEXPECTED    Lidocaine, as indicated in the declared medication list, is not    always detected even when used as directed. ==================================================================== Test                      Result    Flag   Units      Ref Range   Creatinine              81               mg/dL      >=20 ==================================================================== Declared Medications:  The flagging and interpretation on this report are based on the  following declared medications.  Unexpected results may arise from  inaccuracies in the declared medications.  **Note: The testing scope of this panel includes these medications:  Clonidine  Cyclobenzaprine (Flexeril)  Hydroxyzine  Metoprolol  Mirtazapine  Pregabalin  Sertraline  **Note: The testing scope of this panel does not include small to  moderate amounts of these reported medications:  Ibuprofen  Topical Lidocaine (EMLA)  **Note: The testing scope of this panel does not include the  following reported medications:  Albuterol  Amlodipine  Benzonatate  Chlorthalidone  Fluticasone (Breo)  Hydralazine  Loratadine  Lovastatin   Meclizine  Montelukast  Omeprazole  Ondansetron  Potassium (Klor-Con)  Prilocaine (EMLA)  Terazosin  Vilanterol (Breo) ==================================================================== For clinical consultation, please call [phone]. ====================================================================    Laboratory Chemistry Profile   Renal Lab Results  Component Value Date   BUN 13 10/05/2019   CREATININE 0.86 10/05/2019   BCR 15 08/21/2018   GFRAA >60 10/05/2019   GFRNONAA >60 10/05/2019    Hepatic Lab Results  Component Value Date   AST 32 10/05/2019   ALT 31 10/05/2019   ALBUMIN 4.2 10/05/2019   ALKPHOS 130 (H) 10/05/2019   LIPASE 23 03/24/2019    Electrolytes Lab Results  Component Value Date   NA 139 10/05/2019   K 3.3 (  L) 10/05/2019   CL 97 (L) 10/05/2019   CALCIUM 9.7 10/05/2019   MG 1.8 08/28/2019   PHOS 2.8 08/23/2019    Bone Lab Results  Component Value Date   VD25OH 24.9 (L) 04/22/2018   25OHVITD1 24 (L) 06/11/2017   25OHVITD2 <1.0 06/11/2017   25OHVITD3 24 06/11/2017    Inflammation (CRP: Acute Phase) (ESR: Chronic Phase) Lab Results  Component Value Date   CRP 8.5 (H) 06/11/2017   ESRSEDRATE 22 06/11/2017   LATICACIDVEN 1.9 03/25/2019      Note: Above Lab results reviewed.  Imaging  CT Chest W Contrast CLINICAL DATA:  Restaging lung cancer, colon cancer, status post chemo radiation  EXAM: CT CHEST WITH CONTRAST  TECHNIQUE: Multidetector CT imaging of the chest was performed during intravenous contrast administration.  CONTRAST:  44m OMNIPAQUE IOHEXOL 300 MG/ML  SOLN  COMPARISON:  CT chest angiogram, 08/23/2019, CT chest, 06/05/2019, PET-CT, 02/05/2019  FINDINGS: Cardiovascular: Aortic atherosclerosis. Mild cardiomegaly. Left coronary artery calcifications. Enlargement of the main pulmonary artery up to 3.6 cm. No pericardial effusion.  Mediastinum/Nodes: No enlarged mediastinal, hilar, or axillary lymph nodes.  Thyroid gland, trachea, and esophagus demonstrate no significant findings.  Lungs/Pleura: Mild centrilobular emphysema. Significant interval increase in dense, right perihilar and paramedian radiation fibrosis, with near-total resolution of previously seen ground-glass opacity throughout the right lung. An underlying previously PET avid nodule of the posterior right upper lobe cannot be discretely appreciated (in the vicinity of series 2, image 52). Unchanged masslike, somewhat spiculated opacity of the peripheral left upper lobe measuring 1.9 x 1.6 cm (series 2, image 73). Occasional small, stable pulmonary nodules, for example a 3 mm nodule of the peripheral right upper lobe (series 2, image 60). Trace, loculated right pleural effusion.  Upper Abdomen: No acute abnormality.  Musculoskeletal: No chest wall mass or suspicious bone lesions identified.  IMPRESSION: 1. Significant interval increase in dense, right perihilar and paramedian radiation fibrosis, with near-total resolution of previously seen ground-glass opacity throughout the right lung. An underlying previously PET avid nodule of the posterior right upper lobe cannot be discretely appreciated (in the vicinity of series 2, image 52). 2. Unchanged masslike, somewhat spiculated opacity of the peripheral left upper lobe measuring 1.9 x 1.6 cm (series 2, image 73), without FDG avidity on prior PET examination. Occasional small, stable pulmonary nodules, for example a 3 mm nodule of the peripheral right upper lobe (series 2, image 60). Attention on follow-up. 3. Emphysema (ICD10-J43.9). 4. Cardiomegaly and coronary artery disease. 5. Enlargement of the main pulmonary artery up to 3.6 cm, as can be seen in pulmonary hypertension. 6. Aortic Atherosclerosis (ICD10-I70.0).  Electronically Signed   By: AEddie CandleM.D.   On: 10/09/2019 13:38  Assessment  The primary encounter diagnosis was Chronic pain syndrome. Diagnoses  of Lumbar facet arthropathy (Bilateral), Lumbar facet syndrome (Bilateral) (R>L), DDD (degenerative disc disease), lumbar, Chronic low back pain (Secondary Area of Pain) (Bilateral) (R>L), Lumbar central spinal stenosis (L4-5), Lumbar disc protrusion (Left: L5-S1) (Right: L1-2), Malignant neoplasm of sigmoid colon (HCC), Ankylosing spondylitis of multiple sites in spine ([facility], Lumbar foraminal stenosis (Bilateral: L4-5) (Left: L5-S1), PTSD (post-traumatic stress disorder), S/P laparoscopic colectomy, Severe episode of recurrent major depressive disorder, without psychotic features ([place], and S/P closure of ileostomy were also pertinent to this visit.  Plan of Care  General Recommendations: The pain condition that the patient suffers from is best treated with a multidisciplinary approach that involves an increase in physical activity to prevent de-conditioning and worsening of  the pain cycle, as well as psychological counseling (formal and/or informal) to address the co-morbid psychological affects of pain. Treatment will often involve judicious use of pain medications and interventional procedures to decrease the pain, allowing the patient to participate in the physical activity that will ultimately produce long-lasting pain reductions. The goal of the multidisciplinary approach is to return the patient to a higher level of overall function and to restore their ability to perform activities of daily living.   Gloria Allen has a current medication list which includes the following long-term medication(s): albuterol, amlodipine, hydralazine, loratadine, lovastatin, metoprolol succinate, montelukast, omeprazole, potassium chloride sa, sertraline, and terazosin.    1. D/C Lyrica. Failed Gabapentin. Do not recommend Cymbalta, pt on max dose Zoloft, risk for serotonin syndrome 2.  UDS appropriate.  Significant chronic pain history due to lumbar spine pathology, chronic pain from malignancy, colon cancer  status post surgery, chemo, radiation.  We will start hydrocodone 7.5 mg twice daily as needed 3.  Recommended patient limit use of BC powder due to risk of gastritis and gastric ulcers 4.  Continue Zoloft as prescribed 5.  Discussed interventional treatment options with patient including diagnostic lumbar facet medial branch nerve blocks to address her lumbar facet pathology however patient states that she is not interested in injection therapy at this time.  We will focus on medication management for her.  Pharmacotherapy (Medications Ordered): Meds ordered this encounter  Medications  . HYDROcodone-acetaminophen ([place]) 7.5-325 MG tablet    Sig: Take 1 tablet by mouth 2 (two) times daily as needed for severe pain. Must last 30 days.    Dispense:  60 tablet    Refill:  0    Chronic Pain. (STOP Act - Not applicable). Fill one day early if closed on scheduled refill date.   Follow-up plan:   Return in about 4 weeks (around 12/17/2019) for Medication Management, virtual.    Recent Visits Date Type Provider Dept  11/12/19 Office Visit Gillis Santa, MD [facility]  Showing recent visits within past 90 days and meeting all other requirements   Today's Visits Date Type Provider Dept  11/19/19 Office Visit Gillis Santa, MD [facility]  Showing today's visits and meeting all other requirements   Future Appointments No visits were found meeting these conditions.  Showing future appointments within next 90 days and meeting all other requirements   I discussed the assessment and treatment plan with the patient. The patient was provided an opportunity to ask questions and all were answered. The patient agreed with the plan and demonstrated an understanding of the instructions.  Patient advised to call back or seek an in-person evaluation if the symptoms or condition worsens.  Duration of encounter: 37mnutes.  Note by: BGillis Santa MD Date: 11/19/2019; Time: 2:01 PM

## 2019-11-24 ENCOUNTER — Other Ambulatory Visit: Payer: Medicare Other | Admitting: Nurse Practitioner

## 2019-11-24 ENCOUNTER — Other Ambulatory Visit: Payer: Self-pay

## 2019-11-24 ENCOUNTER — Encounter: Payer: Self-pay | Admitting: Nurse Practitioner

## 2019-11-24 DIAGNOSIS — C3490 Malignant neoplasm of unspecified part of unspecified bronchus or lung: Secondary | ICD-10-CM

## 2019-11-24 DIAGNOSIS — Z515 Encounter for palliative care: Secondary | ICD-10-CM

## 2019-11-24 NOTE — Progress Notes (Signed)
[facility] Palliative Care Consult Note Telephone: [phone]  Fax: [phone]  PATIENT NAME: Gloria Allen DOB: [birth year] MRN: [id number] PRIMARY CARE PROVIDER:   [name], MD  REFERRING PROVIDER:  [name], MD [address] [place],  [place] [address]   RESPONSIBLE PARTY:Self  Due to the COVID-19 crisis, this visit was done via telemedicine from my office and it was initiated and consent by this patient and or family.  RECOMMENDATIONS and PLAN: 1.ACP: DNR;  blank MOST form and hard choice book to review and will revisit at next [employer] f/u visit after Ms. Lipsitz reviews.   2.Pain secondary to Malignant neoplasm of colon s/p partial resection with ileostomy/colostomy, small cell lung cancer s/p chemotherapy/radiationwill continue to monitor on pain scale, monitor efficacy vs adverse side effects.Continue with pain management appointment  3.Palliative care encounter Palliative medicine team will continue to support patient, patient's family, and medical team. Visit consisted of counseling and education dealing with the complex and emotionally intense issues of symptom management and palliative care in the setting of serious and potentially life-threatening illness  I spent 45 minutes providing this consultation,  from 9:00am to 9:45am. More than 50% of the time in this consultation was spent coordinating communication.   HISTORY OF PRESENT ILLNESS:  Gloria Allen is a 74 y.o. year old female with multiple medical problems including Malignant neoplasm of colon s/p partial resection with ileostomy/colostomy, small cell lung cancer s/p chemotherapy/radiation, hypertension, gerd, headache, degenerative disc disease,asthma, arthritis, lumbar facet syndrome, lumbar disc protrusion, anxiety, depression, PTSD, right shoulder arthroscopy, reduction mammoplasty, port-a-cath placement, right orif ankle, appendectomy, abdominal  hysterectomy, D&C of uterus.I call [name] for scheduled telemedicine telephonics as video not available follow-up palliative care visit. [name] and I talk to that purpose of palliative care visit and she was in agreement. We talked about how much items this feeling today. [name] endorses that she's been doing okay, concerned about the way her skin has been smelling. [name] endorses one of the biggest concern she has. Ms. Kivett endorses that she is finishing up the antibiotics that was prescribed by primary for UTI. [name] and I did talk about recent events of hospitalization with reversal of colostomy for what she did well. We talked about recent appointment with pain clinic 2 / 18 / 2021. Weight at that time was 149 lb with BMI 24.7. [name] reviewed visit with pain center. Also will be doing Psychiatry visit telemedicine. Plan from visit was to discontinue Lyrica, failed gabapentin and did not recommend Cymbalta but to maximize dose of Zoloft. UDS was appropriate. Recommendation option of Interventional treatment with possible block to address lumbar facet pathology though she is not interested in an injection at this time. Hydrocodone was prescribed 7.5 / 325 mg 1 tablet twice a day as needed for severe pain. [name] has a follow-up appointment for weeks with pain clinic. [name] endorses that she felt good about her appointment at the pain clinic. Ms. Candie Mile was glad that they were able to arrange Psychiatry by telemedicine. Ms. Knock and I talked about her primary provider at [facility] which is some distance from her house. [name] endorses that she has been seen by [name] in the past and will likely transfer her care there for convenience. [name] and I talked about symptoms of shortness of breath what she denied. We talked about her appetite. We talked about coping strategies  with chronic illness. We talked about social isolation. We talked about medical goals of care. We talked  about follow up palliative care visit in 4 weeks if needed or sooner should [name] to decline. [name] in agreement. Appointment scheduled. Most of visit was supportive. Therapeutic listening and emotional support provided. Questions answered satisfaction. Contact information Palliative Care was asked to help to continue address goals of care.   CODE STATUS: DNR  PPS: 60% HOSPICE ELIGIBILITY/DIAGNOSIS: TBD  PAST MEDICAL HISTORY:  Past Medical History:  Diagnosis Date  . Anxiety   . Arthritis   . Asthma   . Cervical central spinal stenosis (C3-C7) (worse at C4-5) 07/30/2017  . Cervical foraminal stenosis (C4-5 and C5-6) (Bilateral) (L>R) 07/30/2017  . Chronic lower extremity pain (Fourth Area of Pain) (Bilateral) (R>L) 06/11/2017  . Chronic neck pain (Primary Area of Pain) (Bilateral) (R>L) 06/11/2017  . Chronic sacroiliac joint pain (Bilateral) (R>L) 06/11/2017  . Chronic shoulder pain [facility] Area of Pain) (Right) 06/11/2017  . Chronic upper extremity pain (Fifth Area of Pain) (Bilateral) (R>L) 06/11/2017  . Colon cancer ([place]) 11/2018   Partial colon resection  . DDD (degenerative disc disease), cervical 07/30/2017  . DDD (degenerative disc disease), lumbar 07/30/2017  . Depression   . DISH (diffuse idiopathic skeletal hyperostosis) 07/30/2017  . Dyspnea    with exertion  . Entrapment syndrome 06/20/2017   2002 on R and 2010 on L  . Full thickness rotator cuff tear 06/12/2017  . GERD (gastroesophageal reflux disease)   . Grade 1 Anterolisthesis of L4 over L5 07/30/2017  . Headache   . Hx: UTI (urinary tract infection)   . Hypertension   . Inflammation of joint of shoulder region 01/15/2017  . Lumbar central spinal stenosis (L4-5) 07/30/2017  . Lumbar disc protrusion (Left: L5-S1) (Right: L1-2) 07/30/2017   L5-S1 left foraminal protrusion with L5 impingement. L1-2 right paracentral protrusion without impingement.  . Lumbar facet arthropathy (Bilateral) 07/30/2017  . Lumbar  facet syndrome (Bilateral) (R>L) 07/30/2017  . Lung cancer ([place]) 11/2018   Chemo  and rad tx's  . Osteoarthritis of shoulder (Right) 01/15/2017    SOCIAL HX:  Social History   Tobacco Use  . Smoking status: Former Smoker    Packs/day: 1.00    Years: 15.00    Pack years: 15.00    Types: Cigarettes    Quit date: 2010    Years since quitting: 11.1  . Smokeless tobacco: Never Used  . Tobacco comment: quit over 30 years ago   Substance Use Topics  . Alcohol use: No    ALLERGIES:  Allergies  Allergen Reactions  . Ace Inhibitors Swelling  . Angiotensin Receptor Blockers Swelling  . Sulfa Antibiotics Itching  . Chlorthalidone Rash  . Flexeril [Cyclobenzaprine] Rash     PERTINENT MEDICATIONS:  Outpatient Encounter Medications as of 11/24/2019  Medication Sig  . albuterol (VENTOLIN HFA) 108 (90 Base) MCG/ACT inhaler Inhale 2 puffs into the lungs every 6 (six) hours as needed for wheezing or shortness of breath.  Marland Kitchen amLODipine (NORVASC) 5 MG tablet Take 5 mg by mouth daily with lunch.   . benzonatate (TESSALON) 200 MG capsule Take 1 capsule (200 mg total) by mouth 3 (three) times daily as needed for cough.  Marland Kitchen BREO ELLIPTA 200-25 MCG/INH AEPB Inhale 1 puff into the lungs daily. (Patient taking differently: Inhale 1 puff into the lungs every morning. )  . cloNIDine (CATAPRES) 0.2 MG tablet Take 0.2 mg by mouth 2 (two) times daily.  Marland Kitchen  hydrALAZINE (APRESOLINE) 100 MG tablet Take 1 tablet (100 mg total) by mouth 3 (three) times daily.  Marland Kitchen HYDROcodone-acetaminophen (NORCO) 7.5-325 MG tablet Take 1 tablet by mouth 2 (two) times daily as needed for severe pain. Must last 30 days.  . hydrOXYzine (ATARAX/VISTARIL) 10 MG tablet Take 1 tablet (10 mg total) by mouth 3 (three) times daily as needed for itching.  . lidocaine-prilocaine (EMLA) cream Apply 1 application topically as directed. Place small amt over port site 1 hour before coming in cancer center for port flush  . loratadine (CLARITIN) 10 MG  tablet Take 1 tablet (10 mg total) by mouth daily.  Marland Kitchen lovastatin (MEVACOR) 40 MG tablet Take 1 tablet (40 mg total) by mouth at bedtime.  . meclizine (ANTIVERT) 12.5 MG tablet Take 1 tablet (12.5 mg total) by mouth 3 (three) times daily as needed for dizziness.  . metoprolol succinate (TOPROL-XL) 50 MG 24 hr tablet Take 50 mg by mouth daily with lunch. Take with or immediately following a meal.   . mirtazapine (REMERON) 15 MG tablet Take 15 mg by mouth at bedtime.  . montelukast (SINGULAIR) 10 MG tablet Take 1 tablet (10 mg total) by mouth at bedtime as needed.  Marland Kitchen omeprazole (PRILOSEC) 40 MG capsule Take 1 capsule (40 mg total) by mouth 2 (two) times daily.  . ondansetron (ZOFRAN) 8 MG tablet Take 1 tablet (8 mg total) by mouth every 8 (eight) hours as needed for nausea. Start on day 3 after carboplatin chemo.  . potassium chloride SA (K-DUR) 20 MEQ tablet Take 1 tablet (20 mEq total) by mouth 2 (two) times daily. (Patient taking differently: Take 20 mEq by mouth daily. )  . sertraline (ZOLOFT) 100 MG tablet Take 2 tablets (200 mg total) by mouth at bedtime.  Marland Kitchen terazosin (HYTRIN) 1 MG capsule Take 1 capsule (1 mg total) by mouth at bedtime.  . traZODone (DESYREL) 50 MG tablet Take 50 mg by mouth at bedtime.   Facility-Administered Encounter Medications as of 11/24/2019  Medication  . heparin lock flush 100 unit/mL  . sodium chloride flush (NS) 0.9 % injection 10 mL  . sodium chloride flush (NS) 0.9 % injection 10 mL  . sodium chloride flush (NS) 0.9 % injection 10 mL    PHYSICAL EXAM:  Deferred  Jolanta Cabeza Z Bernal Luhman, NP

## 2019-12-08 ENCOUNTER — Other Ambulatory Visit: Payer: Self-pay

## 2019-12-08 ENCOUNTER — Inpatient Hospital Stay: Payer: Medicare Other | Attending: Oncology | Admitting: Oncology

## 2019-12-08 ENCOUNTER — Inpatient Hospital Stay: Payer: Medicare Other

## 2019-12-08 ENCOUNTER — Encounter: Payer: Self-pay | Admitting: Oncology

## 2019-12-08 VITALS — BP 101/77 | HR 81 | Temp 97.8°F | Ht 65.0 in | Wt 153.0 lb

## 2019-12-08 DIAGNOSIS — G8929 Other chronic pain: Secondary | ICD-10-CM | POA: Diagnosis not present

## 2019-12-08 DIAGNOSIS — Z79818 Long term (current) use of other agents affecting estrogen receptors and estrogen levels: Secondary | ICD-10-CM | POA: Diagnosis not present

## 2019-12-08 DIAGNOSIS — Z85038 Personal history of other malignant neoplasm of large intestine: Secondary | ICD-10-CM

## 2019-12-08 DIAGNOSIS — C187 Malignant neoplasm of sigmoid colon: Secondary | ICD-10-CM

## 2019-12-08 DIAGNOSIS — R0600 Dyspnea, unspecified: Secondary | ICD-10-CM | POA: Diagnosis not present

## 2019-12-08 DIAGNOSIS — Z08 Encounter for follow-up examination after completed treatment for malignant neoplasm: Secondary | ICD-10-CM

## 2019-12-08 DIAGNOSIS — R630 Anorexia: Secondary | ICD-10-CM | POA: Diagnosis not present

## 2019-12-08 DIAGNOSIS — Z923 Personal history of irradiation: Secondary | ICD-10-CM | POA: Diagnosis not present

## 2019-12-08 DIAGNOSIS — E876 Hypokalemia: Secondary | ICD-10-CM

## 2019-12-08 DIAGNOSIS — F419 Anxiety disorder, unspecified: Secondary | ICD-10-CM | POA: Diagnosis not present

## 2019-12-08 DIAGNOSIS — C3490 Malignant neoplasm of unspecified part of unspecified bronchus or lung: Secondary | ICD-10-CM | POA: Insufficient documentation

## 2019-12-08 DIAGNOSIS — F329 Major depressive disorder, single episode, unspecified: Secondary | ICD-10-CM | POA: Diagnosis not present

## 2019-12-08 DIAGNOSIS — Z9221 Personal history of antineoplastic chemotherapy: Secondary | ICD-10-CM | POA: Diagnosis not present

## 2019-12-08 DIAGNOSIS — R5383 Other fatigue: Secondary | ICD-10-CM | POA: Diagnosis not present

## 2019-12-08 DIAGNOSIS — M545 Low back pain: Secondary | ICD-10-CM | POA: Diagnosis not present

## 2019-12-08 DIAGNOSIS — Z95828 Presence of other vascular implants and grafts: Secondary | ICD-10-CM

## 2019-12-08 DIAGNOSIS — Z85118 Personal history of other malignant neoplasm of bronchus and lung: Secondary | ICD-10-CM

## 2019-12-08 DIAGNOSIS — K219 Gastro-esophageal reflux disease without esophagitis: Secondary | ICD-10-CM | POA: Insufficient documentation

## 2019-12-08 DIAGNOSIS — Z87891 Personal history of nicotine dependence: Secondary | ICD-10-CM | POA: Diagnosis not present

## 2019-12-08 LAB — COMPREHENSIVE METABOLIC PANEL
ALT: 15 U/L (ref 0–44)
AST: 19 U/L (ref 15–41)
Albumin: 3.7 g/dL (ref 3.5–5.0)
Alkaline Phosphatase: 106 U/L (ref 38–126)
Anion gap: 12 (ref 5–15)
BUN: 12 mg/dL (ref 8–23)
CO2: 25 mmol/L (ref 22–32)
Calcium: 9 mg/dL (ref 8.9–10.3)
Chloride: 98 mmol/L (ref 98–111)
Creatinine, Ser: 0.54 mg/dL (ref 0.44–1.00)
GFR calc Af Amer: >60 mL/min (ref >60)
GFR calc non Af Amer: >60 mL/min (ref >60)
Glucose, Bld: 243 mg/dL — ABNORMAL HIGH (ref 70–99)
Potassium: 3.2 mmol/L — ABNORMAL LOW (ref 3.5–5.1)
Sodium: 135 mmol/L (ref 135–145)
Total Bilirubin: 0.3 mg/dL (ref 0.3–1.2)
Total Protein: 7.1 g/dL (ref 6.5–8.1)

## 2019-12-08 LAB — CBC WITH DIFFERENTIAL/PLATELET
Abs Immature Granulocytes: 0.01 10*3/uL (ref 0.00–0.07)
Basophils Absolute: 0.1 10*3/uL (ref 0.0–0.1)
Basophils Relative: 1 %
Eosinophils Absolute: 0.3 10*3/uL (ref 0.0–0.5)
Eosinophils Relative: 5 %
HCT: 35.9 % — ABNORMAL LOW (ref 36.0–46.0)
Hemoglobin: 11.1 g/dL — ABNORMAL LOW (ref 12.0–15.0)
Immature Granulocytes: 0 %
Lymphocytes Relative: 19 %
Lymphs Abs: 1 10*3/uL (ref 0.7–4.0)
MCH: 26.6 pg (ref 26.0–34.0)
MCHC: 30.9 g/dL (ref 30.0–36.0)
MCV: 86.1 fL (ref 80.0–100.0)
Monocytes Absolute: 0.4 10*3/uL (ref 0.1–1.0)
Monocytes Relative: 7 %
Neutro Abs: 3.5 10*3/uL (ref 1.7–7.7)
Neutrophils Relative %: 68 %
Platelets: 265 10*3/uL (ref 150–400)
RBC: 4.17 MIL/uL (ref 3.87–5.11)
RDW: 14 % (ref 11.5–15.5)
WBC: 5.2 10*3/uL (ref 4.0–10.5)
nRBC: 0 % (ref 0.0–0.2)

## 2019-12-08 MED ORDER — MEGESTROL ACETATE 20 MG PO TABS: 20.0000 mg | ORAL_TABLET | Freq: Every day | ORAL | 0 refills | Status: AC

## 2019-12-08 MED ORDER — SODIUM CHLORIDE 0.9% FLUSH
10.0000 mL | Freq: Once | INTRAVENOUS | Status: AC
  Filled 2019-12-08: qty 10

## 2019-12-08 MED ORDER — HEPARIN SOD (PORK) LOCK FLUSH 100 UNIT/ML IV SOLN
500.0000 [IU] | Freq: Once | INTRAVENOUS | Status: AC
  Filled 2019-12-08: qty 5

## 2019-12-08 MED ORDER — SODIUM CHLORIDE 0.9 % IV SOLN
Freq: Once | INTRAVENOUS | Status: AC
  Filled 2019-12-08: qty 1000

## 2019-12-08 NOTE — Progress Notes (Signed)
Patient would like to know if she could get a refill on her tessalon perles. Patient stated that he had been

## 2019-12-09 LAB — CEA: CEA: 2.5 ng/mL (ref 0.0–4.7)

## 2019-12-14 NOTE — Progress Notes (Signed)
Hematology/Oncology Consult note [facility]  [phone] [phone]  Patient Care Team: [name], MD as PCP - General (Family Medicine)   Name of the patient: Gloria Allen  [id number]  November 22, 1945   Date of visit: 12/14/19  Diagnosis- 1.Stage III colon cancer status post surgery 2.Limited stage small cell lung cancer  Chief complaint/ Reason for visit-routine follow-up of lung and colon cancer  Heme/Onc history: patient is a 74 year old African-American female who recently underwent screening colonoscopy by Dr. Bonna Gains.Colonoscopy showed villous nonobstructing large mass in the sigmoid colon 12 cm proximal to the anus. Mass was partially circumferential. Biopsy showed invasive adenocarcinoma. Patient was seen by Dr. Gretchen Short underwent hemicolectomy with lymph node sampling on 11/27/2018. Final pathology showed invasive colorectal carcinoma 3.5 cm, grade 2. Tumor invades through muscularis propria into the peri-colorectal tissue. Margins negative. Tumor deposits present, 2. 1 out of 9 lymph nodes examined was positive for malignancy. [id number].Patient was feeling poorly after surgery and she had no social support and decided against adjuvant chemotherapy.  Patient underwent CT chest to complete her staging work-up which showed 2 lung nodules one in the right upper lobe and one in the left left upper lobe.This was followed by a PET CT scan which showed with the left upper lobe nodule was not hypermetabolic.The right upper lobe lung nodule measure 1.6 cm with an SUV of 6.8. No hypermetabolic axillary mediastinal or hilar lymphadenopathy or evidence of metastatic disease elsewhere. Patient underwent CT-guided biopsy of the right upper lobe lung nodule which was consistent with small cell lung cancerpositive for CD56 and TTF-1.Patient started concurrent chemoradiation in June 2020. Carboplatin chosen instead of cisplatin with  etoposide given her age, poor social support and concern for renal dysfunction. Patient completed 4 cycles of carboplatin and etoposide on 05/19/2019   Interval history-reports ongoing fatigue.  States she has a poor appetite.  Has chronic low back pain for which she sees pain clinic.  ECOG PS- 1 Pain scale- 3   Review of systems- Review of Systems  Constitutional: Positive for malaise/fatigue. Negative for chills, fever and weight loss.       Loss of appetite  HENT: Negative for congestion, ear discharge and nosebleeds.   Eyes: Negative for blurred vision.  Respiratory: Negative for cough, hemoptysis, sputum production, shortness of breath and wheezing.   Cardiovascular: Negative for chest pain, palpitations, orthopnea and claudication.  Gastrointestinal: Negative for abdominal pain, blood in stool, constipation, diarrhea, heartburn, melena, nausea and vomiting.  Genitourinary: Negative for dysuria, flank pain, frequency, hematuria and urgency.  Musculoskeletal: Positive for back pain. Negative for joint pain and myalgias.  Skin: Negative for rash.  Neurological: Negative for dizziness, tingling, focal weakness, seizures, weakness and headaches.  Endo/Heme/Allergies: Does not bruise/bleed easily.  Psychiatric/Behavioral: Negative for depression and suicidal ideas. The patient does not have insomnia.       Allergies  Allergen Reactions  . Ace Inhibitors Swelling  . Angiotensin Receptor Blockers Swelling  . Sulfa Antibiotics Itching  . Chlorthalidone Rash  . Flexeril [Cyclobenzaprine] Rash     Past Medical History:  Diagnosis Date  . Anxiety   . Arthritis   . Asthma   . Cervical central spinal stenosis (C3-C7) (worse at C4-5) 07/30/2017  . Cervical foraminal stenosis (C4-5 and C5-6) (Bilateral) (L>R) 07/30/2017  . Chronic lower extremity pain (Fourth Area of Pain) (Bilateral) (R>L) 06/11/2017  . Chronic neck pain (Primary Area of Pain) (Bilateral) (R>L) 06/11/2017  .  Chronic sacroiliac joint pain (  Bilateral) (R>L) 06/11/2017  . Chronic shoulder pain [facility] Area of Pain) (Right) 06/11/2017  . Chronic upper extremity pain (Fifth Area of Pain) (Bilateral) (R>L) 06/11/2017  . Colon cancer ([place]) 11/2018   Partial colon resection  . DDD (degenerative disc disease), cervical 07/30/2017  . DDD (degenerative disc disease), lumbar 07/30/2017  . Depression   . DISH (diffuse idiopathic skeletal hyperostosis) 07/30/2017  . Dyspnea    with exertion  . Entrapment syndrome 06/20/2017   2002 on R and 2010 on L  . Full thickness rotator cuff tear 06/12/2017  . GERD (gastroesophageal reflux disease)   . Grade 1 Anterolisthesis of L4 over L5 07/30/2017  . Headache   . Hx: UTI (urinary tract infection)   . Hypertension   . Inflammation of joint of shoulder region 01/15/2017  . Lumbar central spinal stenosis (L4-5) 07/30/2017  . Lumbar disc protrusion (Left: L5-S1) (Right: L1-2) 07/30/2017   L5-S1 left foraminal protrusion with L5 impingement. L1-2 right paracentral protrusion without impingement.  . Lumbar facet arthropathy (Bilateral) 07/30/2017  . Lumbar facet syndrome (Bilateral) (R>L) 07/30/2017  . Lung cancer ([place]) 11/2018   Chemo  and rad tx's  . Osteoarthritis of shoulder (Right) 01/15/2017     Past Surgical History:  Procedure Laterality Date  . ABDOMINAL HYSTERECTOMY    . APPENDECTOMY    . BREAST REDUCTION SURGERY Bilateral   . COLONOSCOPY WITH PROPOFOL N/A 11/05/2018   Procedure: COLONOSCOPY WITH PROPOFOL;  Surgeon: Virgel Manifold, MD;  Location: [facility] ENDOSCOPY;  Service: Endoscopy;  Laterality: N/A;  . COLONOSCOPY WITH PROPOFOL N/A 11/06/2018   Procedure: COLONOSCOPY WITH PROPOFOL;  Surgeon: Virgel Manifold, MD;  Location: [facility] ENDOSCOPY;  Service: Endoscopy;  Laterality: N/A;  . COLONOSCOPY WITH PROPOFOL N/A 07/15/2019   Procedure: COLONOSCOPY WITH PROPOFOL;  Surgeon: Virgel Manifold, MD;  Location: [facility] ENDOSCOPY;  Service: Endoscopy;   Laterality: N/A;  . COLOSTOMY    . DILATION AND CURETTAGE OF UTERUS    . ESOPHAGOGASTRODUODENOSCOPY (EGD) WITH PROPOFOL N/A 11/05/2018   Procedure: ESOPHAGOGASTRODUODENOSCOPY (EGD) WITH PROPOFOL;  Surgeon: Virgel Manifold, MD;  Location: [facility] ENDOSCOPY;  Service: Endoscopy;  Laterality: N/A;  . ESOPHAGOGASTRODUODENOSCOPY (EGD) WITH PROPOFOL N/A 03/27/2019   Procedure: ESOPHAGOGASTRODUODENOSCOPY (EGD) WITH PROPOFOL;  Surgeon: Lucilla Lame, MD;  Location: [facility] ENDOSCOPY;  Service: Endoscopy;  Laterality: N/A;  . ILEOSTOMY Right 11/27/2018   Procedure: ILEOSTOMY;  Surgeon: Jules Husbands, MD;  Location: [facility] ORS;  Service: General;  Laterality: Right;  . ILEOSTOMY CLOSURE N/A 08/18/2019   Procedure: ILEOSTOMY TAKEDOWN;  Surgeon: Jules Husbands, MD;  Location: [facility] ORS;  Service: General;  Laterality: N/A;  . LAPAROSCOPIC SIGMOID COLECTOMY N/A 11/27/2018   Procedure: LAPAROSCOPIC SIGMOID COLECTOMY;  Surgeon: Jules Husbands, MD;  Location: [facility] ORS;  Service: General;  Laterality: N/A;  . ORIF ANKLE FRACTURE Right 11/21/2018   Procedure: OPEN REDUCTION INTERNAL FIXATION (ORIF) ANKLE FRACTURE;  Surgeon: Corky Mull, MD;  Location: [facility] ORS;  Service: Orthopedics;  Laterality: Right;  . PORT-A-CATH REMOVAL Right 08/18/2019   Procedure: REMOVAL PORT-A-CATH;  Surgeon: Jules Husbands, MD;  Location: [facility] ORS;  Service: General;  Laterality: Right;  . PORTACATH PLACEMENT N/A 03/12/2019   Procedure: INSERTION PORT-A-CATH;  Surgeon: Jules Husbands, MD;  Location: [facility] ORS;  Service: General;  Laterality: N/A;  . REDUCTION MAMMAPLASTY    . SHOULDER ARTHROSCOPY WITH OPEN ROTATOR CUFF REPAIR AND DISTAL CLAVICLE ACROMINECTOMY Right 03/26/2017   Procedure: right shoulder arthroscopy, arthroscopic subacromial decompression, distal clavicle excision, mini  open rotator cuff repair;  Surgeon: Thornton Park, MD;  Location: [facility] ORS;  Service: Orthopedics;  Laterality: Right;    Social History   Socioeconomic  History  . Marital status: Divorced    Spouse name: Not on file  . Number of children: 3  . Years of education: Not on file  . Highest education level: 11th grade  Occupational History  . Not on file  Tobacco Use  . Smoking status: Former Smoker    Packs/day: 1.00    Years: 15.00    Pack years: 15.00    Types: Cigarettes    Quit date: 2010    Years since quitting: 11.2  . Smokeless tobacco: Never Used  . Tobacco comment: quit over 30 years ago   Substance and Sexual Activity  . Alcohol use: No  . Drug use: Yes    Types: Marijuana    Comment: occ  . Sexual activity: Never  Other Topics Concern  . 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 She receives disability. She has 3 children and several grandchildren. She lives alone. She drives. Uses a walker and shower chair at baseline.    Social Determinants of Health   Financial Resource Strain:   . Difficulty of Paying Living Expenses:   Food Insecurity:   . Worried About Charity fundraiser in the Last Year:   . Arboriculturist in the Last Year:   Transportation Needs:   . Film/video editor (Medical):   Marland Kitchen Lack of Transportation (Non-Medical):   Physical Activity:   . Days of Exercise per Week:   . Minutes of Exercise per Session:   Stress:   . Feeling of Stress :   Social Connections:   . Frequency of Communication with Friends and Family:   . Frequency of Social Gatherings with Friends and Family:   . Attends Religious Services:   . Active Member of Clubs or Organizations:   . Attends Archivist Meetings:   Marland Kitchen Marital Status:   Intimate Partner Violence:   . Fear of Current or Ex-Partner:   . Emotionally Abused:   Marland Kitchen Physically Abused:   . Sexually Abused:     Family History  Problem Relation Age of Onset  . Hypertension Mother   . Breast cancer Mother 12  . Breast cancer Sister 35     Current Outpatient Medications:  .  albuterol (VENTOLIN HFA) 108  (90 Base) MCG/ACT inhaler, Inhale 2 puffs into the lungs every 6 (six) hours as needed for wheezing or shortness of breath., Disp: 18 g, Rfl: 0 .  amLODipine (NORVASC) 10 MG tablet, Take 10 mg by mouth daily., Disp: , Rfl:  .  BREO ELLIPTA 200-25 MCG/INH AEPB, Inhale 1 puff into the lungs daily. (Patient taking differently: Inhale 1 puff into the lungs every morning. ), Disp: 60 each, Rfl: 0 .  chlorthalidone (HYGROTON) 25 MG tablet, Take 25 mg by mouth daily., Disp: , Rfl:  .  cloNIDine (CATAPRES) 0.3 MG tablet, Take 0.3 mg by mouth 2 (two) times daily., Disp: , Rfl:  .  cyclobenzaprine (FLEXERIL) 10 MG tablet, Take 10 mg by mouth at bedtime., Disp: , Rfl:  .  DULoxetine (CYMBALTA) 30 MG capsule, Take 30 mg by mouth daily., Disp: , Rfl:  .  hydrALAZINE (APRESOLINE) 100 MG tablet, Take 1 tablet (100 mg total) by mouth 3 (three) times daily., Disp: 90 tablet, Rfl: 0 .  HYDROcodone-acetaminophen (NORCO) 7.5-325 MG tablet, Take 1 tablet by mouth 2 (two) times daily as needed for severe pain. Must last 30 days., Disp: 60 tablet, Rfl: 0 .  hydrOXYzine (ATARAX/VISTARIL) 10 MG tablet, Take 1 tablet (10 mg total) by mouth 3 (three) times daily as needed for itching., Disp: 30 tablet, Rfl: 0 .  loperamide (IMODIUM) 2 MG capsule, Take 4 mg by mouth 3 (three) times daily., Disp: , Rfl:  .  lovastatin (MEVACOR) 40 MG tablet, Take 1 tablet (40 mg total) by mouth at bedtime., Disp: 30 tablet, Rfl: 0 .  meclizine (ANTIVERT) 12.5 MG tablet, Take 1 tablet (12.5 mg total) by mouth 3 (three) times daily as needed for dizziness., Disp: 30 tablet, Rfl: 0 .  metoprolol succinate (TOPROL-XL) 50 MG 24 hr tablet, Take 50 mg by mouth daily with lunch. Take with or immediately following a meal. , Disp: , Rfl:  .  mirtazapine (REMERON) 15 MG tablet, Take 15 mg by mouth at bedtime., Disp: , Rfl:  .  montelukast (SINGULAIR) 10 MG tablet, Take 1 tablet (10 mg total) by mouth at bedtime as needed., Disp: 30 tablet, Rfl: 0 .   omeprazole (PRILOSEC) 40 MG capsule, Take 1 capsule (40 mg total) by mouth 2 (two) times daily., Disp: 60 capsule, Rfl: 0 .  ondansetron (ZOFRAN) 8 MG tablet, Take 1 tablet (8 mg total) by mouth every 8 (eight) hours as needed for nausea. Start on day 3 after carboplatin chemo., Disp: 30 tablet, Rfl: 0 .  potassium chloride SA (K-DUR) 20 MEQ tablet, Take 1 tablet (20 mEq total) by mouth 2 (two) times daily. (Patient taking differently: Take 20 mEq by mouth daily. ), Disp: 28 tablet, Rfl: 0 .  sertraline (ZOLOFT) 100 MG tablet, Take 2 tablets (200 mg total) by mouth at bedtime., Disp: 60 tablet, Rfl: 0 .  terazosin (HYTRIN) 1 MG capsule, Take 1 capsule (1 mg total) by mouth at bedtime., Disp: 30 capsule, Rfl: 0 .  traZODone (DESYREL) 50 MG tablet, Take 50 mg by mouth at bedtime., Disp: , Rfl:  .  benzonatate (TESSALON) 200 MG capsule, Take 1 capsule (200 mg total) by mouth 3 (three) times daily as needed for cough. (Patient not taking: Reported on 12/08/2019), Disp: 60 capsule, Rfl: 0 .  megestrol (MEGACE) 20 MG tablet, Take 1 tablet (20 mg total) by mouth daily., Disp: 30 tablet, Rfl: 0 .  metroNIDAZOLE (FLAGYL) 500 MG tablet, Take 500 mg by mouth 2 (two) times daily., Disp: , Rfl:  .  mirtazapine (REMERON SOL-TAB) 15 MG disintegrating tablet, Take 15 mg by mouth at bedtime., Disp: , Rfl:  No current facility-administered medications for this visit.  Facility-Administered Medications Ordered in Other Visits:  .  heparin lock flush 100 unit/mL, 500 Units, Intravenous, Once, Sindy Guadeloupe, MD .  heparin lock flush 100 unit/mL, 500 Units, Intravenous, Once, Sindy Guadeloupe, MD .  sodium chloride flush (NS) 0.9 % injection 10 mL, 10 mL, Intravenous, PRN, Verlon Au, NP, 10 mL at 04/13/19 1430 .  sodium chloride flush (NS) 0.9 % injection 10 mL, 10 mL, Intravenous, Once, Sindy Guadeloupe, MD .  sodium chloride flush (NS) 0.9 % injection 10 mL, 10 mL, Intravenous, Once, Randa Evens C, MD .  sodium  chloride flush (NS) 0.9 % injection 10 mL, 10 mL, Intravenous, Once, Sindy Guadeloupe, MD  Physical exam:  Vitals:   12/08/19 1025  BP: 101/77  Pulse: 81  Temp: 97.8 F (36.6 C)  TempSrc: Tympanic  SpO2: 99%  Weight: 153 lb (69.4 kg)  Height: 5\' 5"  (1.651 m)   Physical Exam HENT:     Head: Normocephalic and atraumatic.  Eyes:     Pupils: Pupils are equal, round, and reactive to light.  Cardiovascular:     Rate and Rhythm: Normal rate and regular rhythm.     Heart sounds: Normal heart sounds.  Pulmonary:     Effort: Pulmonary effort is normal.     Breath sounds: Normal breath sounds.  Abdominal:     General: Bowel sounds are normal.     Palpations: Abdomen is soft.  Musculoskeletal:     Cervical back: Normal range of motion.  Skin:    General: Skin is warm and dry.  Neurological:     Mental Status: She is alert and oriented to person, place, and time.      CMP Latest Ref Rng & Units 12/08/2019  Glucose 70 - 99 mg/dL 243(H)  BUN 8 - 23 mg/dL 12  Creatinine 0.44 - 1.00 mg/dL 0.54  Sodium 135 - 145 mmol/L 135  Potassium 3.5 - 5.1 mmol/L 3.2(L)  Chloride 98 - 111 mmol/L 98  CO2 22 - 32 mmol/L 25  Calcium 8.9 - 10.3 mg/dL 9.0  Total Protein 6.5 - 8.1 g/dL 7.1  Total Bilirubin 0.3 - 1.2 mg/dL 0.3  Alkaline Phos 38 - 126 U/L 106  AST 15 - 41 U/L 19  ALT 0 - 44 U/L 15   CBC Latest Ref Rng & Units 12/08/2019  WBC 4.0 - 10.5 K/uL 5.2  Hemoglobin 12.0 - 15.0 g/dL 11.1(L)  Hematocrit 36.0 - 46.0 % 35.9(L)  Platelets 150 - 400 K/uL 265      Assessment and plan- Patient is a 74 y.o. female  with history of stage III colon cancer s/p resection, limited stage small cell lung cancer stage I s/p concurrent radiation with 4 cycles of carboplatin and etoposide this is a routine follow-up visit of lung and colon cancer  1.  Stage III colon cancer: S/p surgery.  She did not get adjuvant chemotherapy postoperatively due to poor performance status.  She remains in remission.   Currently no concerning signs and symptoms of recurrence.  CEA is within normal limits.  She will continue to get CT scans every 6 months.  2.  Stage I small cell lung cancer: S/p concurrent chemoradiation.  Last CT scan was done in January 2021 which did not show any evidence of malignancy.  Changes of radiation fibrosis was noted.  3.  We will give her 1 L of IV fluids today with 10 mEq of IV potassium.  I will see her back in 3 months with repeat CT chest abdomen pelvis with contrast prior  4.  We will give her a trial of Megace for appetite loss Visit Diagnosis 1. Encounter for follow-up surveillance of colon cancer   2. Encounter for follow-up surveillance of lung cancer   3. Appetite loss      Dr. Randa Evens, MD, MPH [facility] [phone] 12/14/2019 8:33 AM

## 2019-12-22 ENCOUNTER — Other Ambulatory Visit: Payer: Self-pay

## 2019-12-22 ENCOUNTER — Telehealth: Payer: Self-pay | Admitting: *Deleted

## 2019-12-22 ENCOUNTER — Encounter: Payer: Self-pay | Admitting: Nurse Practitioner

## 2019-12-22 ENCOUNTER — Encounter: Payer: Self-pay | Admitting: Student in an Organized Health Care Education/Training Program

## 2019-12-22 ENCOUNTER — Other Ambulatory Visit: Payer: Medicare Other | Admitting: Nurse Practitioner

## 2019-12-22 DIAGNOSIS — C3490 Malignant neoplasm of unspecified part of unspecified bronchus or lung: Secondary | ICD-10-CM

## 2019-12-22 DIAGNOSIS — Z515 Encounter for palliative care: Secondary | ICD-10-CM

## 2019-12-22 NOTE — Telephone Encounter (Signed)
Called for pre-visit assessment for Thursday's visit. No answer and no way to leave VM because VM bos is full.

## 2019-12-22 NOTE — Progress Notes (Signed)
[facility] Palliative Care Consult Note Telephone: [phone]  Fax: [phone]  PATIENT NAME: Gloria Allen DOB: 05-20-1946 MRN: [id number]  PRIMARY CARE PROVIDER:   [name], MD  REFERRING PROVIDER:  [name], MD [address] [place],  [place] [address] RESPONSIBLE PARTY:Self  Due to the COVID-19 crisis, this visit was done via telemedicine from my office and it was initiated and consent by this patient and or family.  RECOMMENDATIONS and PLAN: 1.ACP: DNR;  blank MOST form and hard choice book to review and will revisit at next [facility] f/u visit after Ms. Abundis reviews.   2.Pain secondary to Malignant neoplasm of colon s/p partial resection with ileostomy/colostomy, small cell lung cancer s/p chemotherapy/radiationwill continue to monitor on pain scale, monitor efficacy vs adverse side effects.Continue with pain management appointment  3.Palliative care encounter Palliative medicine team will continue to support patient, patient's family, and medical team. Visit consisted of counseling and education dealing with the complex and emotionally intense issues of symptom management and palliative care in the setting of serious and potentially life-threatening illness   I spent 45 minutes providing this consultation,  from 9:00am to 9:45am. More than 50% of the time in this consultation was spent coordinating communication.   HISTORY OF PRESENT ILLNESS:  Gloria Allen is a 74 y.o. year old female with multiple medical problems including Malignant neoplasm of colon s/p partial resection with ileostomy/colostomy, small cell lung cancer s/p chemotherapy/radiation, hypertension, gerd, headache, degenerative disc disease,asthma, arthritis, lumbar facet syndrome, lumbar disc protrusion, anxiety, depression, PTSD, right shoulder arthroscopy, reduction mammoplasty, port-a-cath placement, right orif ankle, appendectomy, abdominal  hysterectomy, D&C of uterus. I called Ms Avans for scheduled telemedicine telephonic as video not available follow-up palliative care visit. Ms Guaman and I talked about purpose of visit. Ms Liberman in agreement. We talked about how she was feeling today. Ms Dohner endorses that she is doing okay. Her ex-husband who she's very close to is currently undergoing surgery. Ms Heatherington endorses that she's very concerned about his surgery. We talked about coping strategies. We talked about her current clinical condition. We talked about her visit with Dr. Janese Banks. We talked about her cancer. We talked about symptoms of pain but she does continue to be followed by the pain clinic. Ms Biber endorses says she's getting close to being out of her medicine. Ms Bogie endorses that she does not at this time have a appointment scheduled with pain management. Discuss with Ms Barse the importance of following up with pain management for her pain medication. Contact information given to Ms Bustamante concerning the pain clinic who follows her. We talked about shortness of breath what she denies. We talked about fatigue and weakness overall with she does experience. We talked about her appetite which is extremely poor. We talked about her weight loss. Ms Alonge endorses she has lost over 20 pounds in the last few months. We talked about her overall decline and ability. We talked about medical goals of care. We talked about possible option under hospice benefit through Medicare. We talked about services that they would provide. We talked about case review by hospice physician and Ms Vogt was in agreement to see about eligibility. We talked about importance of nutrition and hydration. We talked about role palliative care and plan of care. Discuss with Ms Lucci will contact her once hospice positions determine eligibility and we can further discuss medical goals of care. Ms Hoston in agreement. Therapeutical  listening and emotional support provided.  Questions answer to satisfaction. Contact information provided. Palliative Care was asked to help address goals of care.   CODE STATUS: DNR  PPS: 50% HOSPICE ELIGIBILITY/DIAGNOSIS: TBD  PAST MEDICAL HISTORY:  Past Medical History:  Diagnosis Date  . Anxiety   . Arthritis   . Asthma   . Cervical central spinal stenosis (C3-C7) (worse at C4-5) 07/30/2017  . Cervical foraminal stenosis (C4-5 and C5-6) (Bilateral) (L>R) 07/30/2017  . Chronic lower extremity pain (Fourth Area of Pain) (Bilateral) (R>L) 06/11/2017  . Chronic neck pain (Primary Area of Pain) (Bilateral) (R>L) 06/11/2017  . Chronic sacroiliac joint pain (Bilateral) (R>L) 06/11/2017  . Chronic shoulder pain [facility] Area of Pain) (Right) 06/11/2017  . Chronic upper extremity pain (Fifth Area of Pain) (Bilateral) (R>L) 06/11/2017  . Colon cancer ([place]) 11/2018   Partial colon resection  . DDD (degenerative disc disease), cervical 07/30/2017  . DDD (degenerative disc disease), lumbar 07/30/2017  . Depression   . DISH (diffuse idiopathic skeletal hyperostosis) 07/30/2017  . Dyspnea    with exertion  . Entrapment syndrome 06/20/2017   2002 on R and 2010 on L  . Full thickness rotator cuff tear 06/12/2017  . GERD (gastroesophageal reflux disease)   . Grade 1 Anterolisthesis of L4 over L5 07/30/2017  . Headache   . Hx: UTI (urinary tract infection)   . Hypertension   . Inflammation of joint of shoulder region 01/15/2017  . Lumbar central spinal stenosis (L4-5) 07/30/2017  . Lumbar disc protrusion (Left: L5-S1) (Right: L1-2) 07/30/2017   L5-S1 left foraminal protrusion with L5 impingement. L1-2 right paracentral protrusion without impingement.  . Lumbar facet arthropathy (Bilateral) 07/30/2017  . Lumbar facet syndrome (Bilateral) (R>L) 07/30/2017  . Lung cancer ([place]) 11/2018   Chemo  and rad tx's  . Osteoarthritis of shoulder (Right) 01/15/2017    SOCIAL HX:  Social History   Tobacco Use  . Smoking status: Former Smoker     Packs/day: 1.00    Years: 15.00    Pack years: 15.00    Types: Cigarettes    Quit date: 2010    Years since quitting: 11.2  . Smokeless tobacco: Never Used  . Tobacco comment: quit over 30 years ago   Substance Use Topics  . Alcohol use: No    ALLERGIES:  Allergies  Allergen Reactions  . Ace Inhibitors Swelling  . Angiotensin Receptor Blockers Swelling  . Sulfa Antibiotics Itching  . Chlorthalidone Rash  . Flexeril [Cyclobenzaprine] Rash     PERTINENT MEDICATIONS:  Outpatient Encounter Medications as of 12/22/2019  Medication Sig  . albuterol (VENTOLIN HFA) 108 (90 Base) MCG/ACT inhaler Inhale 2 puffs into the lungs every 6 (six) hours as needed for wheezing or shortness of breath.  Marland Kitchen amLODipine (NORVASC) 10 MG tablet Take 10 mg by mouth daily.  . benzonatate (TESSALON) 200 MG capsule Take 1 capsule (200 mg total) by mouth 3 (three) times daily as needed for cough. (Patient not taking: Reported on 12/08/2019)  . BREO ELLIPTA 200-25 MCG/INH AEPB Inhale 1 puff into the lungs daily. (Patient taking differently: Inhale 1 puff into the lungs every morning. )  . chlorthalidone (HYGROTON) 25 MG tablet Take 25 mg by mouth daily.  . cloNIDine (CATAPRES) 0.3 MG tablet Take 0.3 mg by mouth 2 (two) times daily.  . cyclobenzaprine (FLEXERIL) 10 MG tablet Take 10 mg by mouth at bedtime.  . DULoxetine (CYMBALTA) 30 MG capsule Take 30 mg by mouth daily.  . hydrALAZINE (APRESOLINE)  100 MG tablet Take 1 tablet (100 mg total) by mouth 3 (three) times daily.  . hydrOXYzine (ATARAX/VISTARIL) 10 MG tablet Take 1 tablet (10 mg total) by mouth 3 (three) times daily as needed for itching.  . loperamide (IMODIUM) 2 MG capsule Take 4 mg by mouth 3 (three) times daily.  Marland Kitchen lovastatin (MEVACOR) 40 MG tablet Take 1 tablet (40 mg total) by mouth at bedtime.  . meclizine (ANTIVERT) 12.5 MG tablet Take 1 tablet (12.5 mg total) by mouth 3 (three) times daily as needed for dizziness.  . megestrol (MEGACE) 20 MG tablet  Take 1 tablet (20 mg total) by mouth daily.  . metoprolol succinate (TOPROL-XL) 50 MG 24 hr tablet Take 50 mg by mouth daily with lunch. Take with or immediately following a meal.   . metroNIDAZOLE (FLAGYL) 500 MG tablet Take 500 mg by mouth 2 (two) times daily.  . mirtazapine (REMERON SOL-TAB) 15 MG disintegrating tablet Take 15 mg by mouth at bedtime.  . mirtazapine (REMERON) 15 MG tablet Take 15 mg by mouth at bedtime.  . montelukast (SINGULAIR) 10 MG tablet Take 1 tablet (10 mg total) by mouth at bedtime as needed.  Marland Kitchen omeprazole (PRILOSEC) 40 MG capsule Take 1 capsule (40 mg total) by mouth 2 (two) times daily.  . ondansetron (ZOFRAN) 8 MG tablet Take 1 tablet (8 mg total) by mouth every 8 (eight) hours as needed for nausea. Start on day 3 after carboplatin chemo.  . potassium chloride SA (K-DUR) 20 MEQ tablet Take 1 tablet (20 mEq total) by mouth 2 (two) times daily. (Patient taking differently: Take 20 mEq by mouth daily. )  . sertraline (ZOLOFT) 100 MG tablet Take 2 tablets (200 mg total) by mouth at bedtime. (Patient not taking: Reported on 12/22/2019)  . terazosin (HYTRIN) 1 MG capsule Take 1 capsule (1 mg total) by mouth at bedtime.  . traZODone (DESYREL) 50 MG tablet Take 50 mg by mouth at bedtime.   Facility-Administered Encounter Medications as of 12/22/2019  Medication  . heparin lock flush 100 unit/mL  . heparin lock flush 100 unit/mL  . sodium chloride flush (NS) 0.9 % injection 10 mL  . sodium chloride flush (NS) 0.9 % injection 10 mL  . sodium chloride flush (NS) 0.9 % injection 10 mL  . sodium chloride flush (NS) 0.9 % injection 10 mL    PHYSICAL EXAM:  Deferred  Saundra Gin Z Omarius Grantham, NP

## 2019-12-24 ENCOUNTER — Encounter: Payer: Self-pay | Admitting: Student in an Organized Health Care Education/Training Program

## 2019-12-24 ENCOUNTER — Other Ambulatory Visit: Payer: Self-pay

## 2019-12-24 ENCOUNTER — Ambulatory Visit
Payer: Medicare Other | Attending: Student in an Organized Health Care Education/Training Program | Admitting: Student in an Organized Health Care Education/Training Program

## 2019-12-24 DIAGNOSIS — M48062 Spinal stenosis, lumbar region with neurogenic claudication: Secondary | ICD-10-CM

## 2019-12-24 DIAGNOSIS — C187 Malignant neoplasm of sigmoid colon: Secondary | ICD-10-CM

## 2019-12-24 DIAGNOSIS — M5442 Lumbago with sciatica, left side: Secondary | ICD-10-CM

## 2019-12-24 DIAGNOSIS — M47816 Spondylosis without myelopathy or radiculopathy, lumbar region: Secondary | ICD-10-CM | POA: Diagnosis not present

## 2019-12-24 DIAGNOSIS — G894 Chronic pain syndrome: Secondary | ICD-10-CM | POA: Diagnosis not present

## 2019-12-24 DIAGNOSIS — G8929 Other chronic pain: Secondary | ICD-10-CM

## 2019-12-24 DIAGNOSIS — M5441 Lumbago with sciatica, right side: Secondary | ICD-10-CM

## 2019-12-24 DIAGNOSIS — M48061 Spinal stenosis, lumbar region without neurogenic claudication: Secondary | ICD-10-CM

## 2019-12-24 DIAGNOSIS — M5136 Other intervertebral disc degeneration, lumbar region: Secondary | ICD-10-CM | POA: Diagnosis not present

## 2019-12-24 DIAGNOSIS — M450 Ankylosing spondylitis of multiple sites in spine: Secondary | ICD-10-CM

## 2019-12-24 DIAGNOSIS — M5126 Other intervertebral disc displacement, lumbar region: Secondary | ICD-10-CM

## 2019-12-24 MED ORDER — HYDROCODONE-ACETAMINOPHEN 7.5-325 MG PO TABS: 1.0000 | ORAL_TABLET | Freq: Two times a day (BID) | ORAL | 0 refills | Status: DC | PRN

## 2019-12-24 MED ORDER — HYDROCODONE-ACETAMINOPHEN 7.5-325 MG PO TABS: 1.0000 | ORAL_TABLET | Freq: Two times a day (BID) | ORAL | 0 refills | Status: AC | PRN

## 2019-12-24 NOTE — Progress Notes (Signed)
Patient: Gloria Allen  Service Category: E/M  Provider: Gillis Santa, MD  DOB: 05-21-1946  DOS: 12/24/2019  Location: Office  MRN: [id number]  Setting: Ambulatory outpatient  Referring Provider: [name], MD  Type: Established Patient  Specialty: Interventional Pain Management  PCP: [name], MD  Location: Home  Delivery: TeleHealth     Virtual Encounter - Pain Management PROVIDER NOTE: Information contained herein reflects review and annotations entered in association with encounter. Interpretation of such information and data should be left to medically-trained personnel. Information provided to patient can be located elsewhere in the medical record under "Patient Instructions". Document created using STT-dictation technology, any transcriptional errors that may result from process are unintentional.    Contact & Pharmacy Preferred: [phone] Home: [phone] (home) Mobile: [phone] (mobile) E-mail: [email or web address]  [facility] [place], [place] - [address] [place] [address] Phone: [phone] Fax: [facility] [address] ([address]), [place] - [address] [place]) [place] [address] Phone: [phone] Fax: [phone]   Pre-screening  Ms. Croswell offered "in-person" vs "virtual" encounter. She indicated preferring virtual for this encounter.   Reason COVID-19*  Social distancing based on CDC and AMA recommendations.   I contacted Gloria Allen on 12/24/2019 via telephone.      I clearly identified myself as Gloria Santa, MD. I verified that I was speaking with the correct person using two identifiers (Name: Gloria Allen, and date of birth: 1946/03/04).  This visit was completed via telephone due to the restrictions of the COVID-19 pandemic. All issues as above were discussed and addressed but no physical exam was performed. If it was felt that the  patient should be evaluated in the office, they were directed there. The patient verbally consented to this visit. Patient was unable to complete an audio/visual visit due to Technical difficulties and/or Lack of internet. Due to the catastrophic nature of the COVID-19 pandemic, this visit was done through audio contact only.  Location of the patient: home address (see Epic for details)  Location of the provider: office  Consent I sought verbal advanced consent from Gloria Allen for virtual visit interactions. I informed Ms. Agena of possible security and privacy concerns, risks, and limitations associated with providing "not-in-person" medical evaluation and management services. I also informed Ms. Pettway of the availability of "in-person" appointments. Finally, I informed her that there would be a charge for the virtual visit and that she could be  personally, fully or partially, financially responsible for it. Ms. Ashmore expressed understanding and agreed to proceed.   Historic Elements   Gloria Allen is a 74 y.o. year old, female patient evaluated today after her last contact with our practice on 12/22/2019. [name]  has a past medical history of Anxiety, Arthritis, Asthma, Cervical central spinal stenosis (C3-C7) (worse at C4-5) (07/30/2017), Cervical foraminal stenosis (C4-5 and C5-6) (Bilateral) (L>R) (07/30/2017), Chronic lower extremity pain (Fourth Area of Pain) (Bilateral) (R>L) (06/11/2017), Chronic neck pain (Primary Area of Pain) (Bilateral) (R>L) (06/11/2017), Chronic sacroiliac joint pain (Bilateral) (R>L) (06/11/2017), Chronic shoulder pain (Tertiary Area of Pain) (Right) (06/11/2017), Chronic upper extremity pain (Fifth Area of Pain) (Bilateral) (R>L) (06/11/2017), Colon cancer ([place]) (11/2018), DDD (degenerative disc disease), cervical (07/30/2017), DDD (degenerative disc disease), lumbar (07/30/2017), Depression, DISH (diffuse idiopathic skeletal hyperostosis) (07/30/2017), Dyspnea,  Entrapment syndrome (06/20/2017), Full thickness rotator cuff tear (06/12/2017), GERD (gastroesophageal reflux disease), Grade 1  Anterolisthesis of L4 over L5 (07/30/2017), Headache, UTI (urinary tract infection), Hypertension, Inflammation of joint of shoulder region (01/15/2017), Lumbar central spinal stenosis (L4-5) (07/30/2017), Lumbar disc protrusion (Left: L5-S1) (Right: L1-2) (07/30/2017), Lumbar facet arthropathy (Bilateral) (07/30/2017), Lumbar facet syndrome (Bilateral) (R>L) (07/30/2017), Lung cancer ([place]) (11/2018), and Osteoarthritis of shoulder (Right) (01/15/2017). She also  has a past surgical history that includes Abdominal hysterectomy; Appendectomy; Breast reduction surgery (Bilateral); Dilation and curettage of uterus; Shoulder arthroscopy with open rotator cuff repair and distal clavicle acrominectomy (Right, 03/26/2017); Reduction mammaplasty; Colonoscopy with propofol (N/A, 11/05/2018); Esophagogastroduodenoscopy (egd) with propofol (N/A, 11/05/2018); Colonoscopy with propofol (N/A, 11/06/2018); ORIF ankle fracture (Right, 11/21/2018); Laparoscopic sigmoid colectomy (N/A, 11/27/2018); Ileostomy (Right, 11/27/2018); Colostomy; Portacath placement (N/A, 03/12/2019); Esophagogastroduodenoscopy (egd) with propofol (N/A, 03/27/2019); Colonoscopy with propofol (N/A, 07/15/2019); Ileostomy closure (N/A, 08/18/2019); and Port-a-cath removal (Right, 08/18/2019). Ms. Scherman has a current medication list which includes the following prescription(s): albuterol, amlodipine, breo ellipta, chlorthalidone, clonidine, cyclobenzaprine, duloxetine, hydralazine, hydroxyzine, lovastatin, meclizine, megestrol, metoprolol succinate, mirtazapine, montelukast, omeprazole, ondansetron, potassium chloride sa, terazosin, trazodone, [START ON 12/25/2019] hydrocodone-acetaminophen, [START ON 01/24/2020] hydrocodone-acetaminophen, loperamide, metronidazole, and mirtazapine, and the following Facility-Administered Medications: heparin lock  flush, heparin lock flush, sodium chloride flush, sodium chloride flush, sodium chloride flush, and sodium chloride flush. She  reports that she quit smoking about 11 years ago. Her smoking use included cigarettes. She has a 15.00 pack-year smoking history. She has never used smokeless tobacco. She reports current drug use. Drug: Marijuana. She reports that she does not drink alcohol. Ms. Birdsell is allergic to ace inhibitors; angiotensin receptor blockers; sulfa antibiotics; chlorthalidone; and flexeril [cyclobenzaprine].   HPI  Today, she is being contacted for medication management.   No change in medical history since last visit.  Patient's pain is at baseline.  Patient continues multimodal pain regimen as prescribed.  States that it provides pain relief and improvement in functional status. Mild pruritus. Discussed how this is related to histamine release 2/2 to opioid therapy. Offered patient Buprenorphine trial, declined.  Pharmacotherapy Assessment  Analgesic:  11/19/2019  2   11/19/2019  Hydrocodone-Acetamin 7.5-325  60.00  30 Bi Lat   [id number]   [facility] (0947)   0  15.00 MME  Medicare   [place]    Monitoring: [place] PMP: PDMP reviewed during this encounter.       Pharmacotherapy: No side-effects or adverse reactions reported. Compliance: No problems identified. Effectiveness: Clinically acceptable. Plan: Refer to "POC".  UDS:  Summary  Date Value Ref Range Status  11/12/2019 Note  Final    Comment:    ==================================================================== Compliance Drug Analysis, Ur ==================================================================== Test                             Result       Flag       Units Drug Present and Declared for Prescription Verification   Pregabalin                     PRESENT      EXPECTED   Mirtazapine                    PRESENT      EXPECTED   Sertraline                     PRESENT      EXPECTED   Desmethylsertraline  PRESENT       EXPECTED    Desmethylsertraline is an expected metabolite of sertraline.   Clonidine                      PRESENT      EXPECTED   Metoprolol                     PRESENT      EXPECTED Drug Present not Declared for Prescription Verification   7-aminoclonazepam              96           UNEXPECTED ng/mg creat    7-aminoclonazepam is an expected metabolite of clonazepam. Source of    clonazepam is a scheduled prescription medication. Drug Absent but Declared for Prescription Verification   Cyclobenzaprine                Not Detected UNEXPECTED   Ibuprofen                      Not Detected UNEXPECTED    Ibuprofen, as indicated in the declared medication list, is not    always detected even when used as directed.   Hydroxyzine                    Not Detected UNEXPECTED   Lidocaine                      Not Detected UNEXPECTED    Lidocaine, as indicated in the declared medication list, is not    always detected even when used as directed. ==================================================================== Test                      Result    Flag   Units      Ref Range   Creatinine              81               mg/dL      >=20 ==================================================================== Declared Medications:  The flagging and interpretation on this report are based on the  following declared medications.  Unexpected results may arise from  inaccuracies in the declared medications.  **Note: The testing scope of this panel includes these medications:  Clonidine  Cyclobenzaprine (Flexeril)  Hydroxyzine  Metoprolol  Mirtazapine  Pregabalin  Sertraline  **Note: The testing scope of this panel does not include small to  moderate amounts of these reported medications:  Ibuprofen  Topical Lidocaine (EMLA)  **Note: The testing scope of this panel does not include the  following reported medications:  Albuterol  Amlodipine  Benzonatate  Chlorthalidone  Fluticasone (Breo)   Hydralazine  Loratadine  Lovastatin  Meclizine  Montelukast  Omeprazole  Ondansetron  Potassium (Klor-Con)  Prilocaine (EMLA)  Terazosin  Vilanterol (Breo) ==================================================================== For clinical consultation, please call [phone]. ====================================================================    Laboratory Chemistry Profile   Renal Lab Results  Component Value Date   BUN 12 12/08/2019   CREATININE 0.54 12/08/2019   BCR 15 08/21/2018   GFRAA >60 12/08/2019   GFRNONAA >60 12/08/2019    Hepatic Lab Results  Component Value Date   AST 19 12/08/2019   ALT 15 12/08/2019   ALBUMIN 3.7 12/08/2019   ALKPHOS 106 12/08/2019   LIPASE 23 03/24/2019    Electrolytes Lab Results  Component Value Date   NA 135  12/08/2019   K 3.2 (L) 12/08/2019   CL 98 12/08/2019   CALCIUM 9.0 12/08/2019   MG 1.8 08/28/2019   PHOS 2.8 08/23/2019    Bone Lab Results  Component Value Date   VD25OH 24.9 (L) 04/22/2018   25OHVITD1 24 (L) 06/11/2017   25OHVITD2 <1.0 06/11/2017   25OHVITD3 24 06/11/2017    Inflammation (CRP: Acute Phase) (ESR: Chronic Phase) Lab Results  Component Value Date   CRP 8.5 (H) 06/11/2017   ESRSEDRATE 22 06/11/2017   LATICACIDVEN 1.9 03/25/2019      Note: Above Lab results reviewed.  Imaging  CT Chest W Contrast CLINICAL DATA:  Restaging lung cancer, colon cancer, status post chemo radiation  EXAM: CT CHEST WITH CONTRAST  TECHNIQUE: Multidetector CT imaging of the chest was performed during intravenous contrast administration.  CONTRAST:  46m OMNIPAQUE IOHEXOL 300 MG/ML  SOLN  COMPARISON:  CT chest angiogram, 08/23/2019, CT chest, 06/05/2019, PET-CT, 02/05/2019  FINDINGS: Cardiovascular: Aortic atherosclerosis. Mild cardiomegaly. Left coronary artery calcifications. Enlargement of the main pulmonary artery up to 3.6 cm. No pericardial effusion.  Mediastinum/Nodes: No enlarged mediastinal,  hilar, or axillary lymph nodes. Thyroid gland, trachea, and esophagus demonstrate no significant findings.  Lungs/Pleura: Mild centrilobular emphysema. Significant interval increase in dense, right perihilar and paramedian radiation fibrosis, with near-total resolution of previously seen ground-glass opacity throughout the right lung. An underlying previously PET avid nodule of the posterior right upper lobe cannot be discretely appreciated (in the vicinity of series 2, image 52). Unchanged masslike, somewhat spiculated opacity of the peripheral left upper lobe measuring 1.9 x 1.6 cm (series 2, image 73). Occasional small, stable pulmonary nodules, for example a 3 mm nodule of the peripheral right upper lobe (series 2, image 60). Trace, loculated right pleural effusion.  Upper Abdomen: No acute abnormality.  Musculoskeletal: No chest wall mass or suspicious bone lesions identified.  IMPRESSION: 1. Significant interval increase in dense, right perihilar and paramedian radiation fibrosis, with near-total resolution of previously seen ground-glass opacity throughout the right lung. An underlying previously PET avid nodule of the posterior right upper lobe cannot be discretely appreciated (in the vicinity of series 2, image 52). 2. Unchanged masslike, somewhat spiculated opacity of the peripheral left upper lobe measuring 1.9 x 1.6 cm (series 2, image 73), without FDG avidity on prior PET examination. Occasional small, stable pulmonary nodules, for example a 3 mm nodule of the peripheral right upper lobe (series 2, image 60). Attention on follow-up. 3. Emphysema (ICD10-J43.9). 4. Cardiomegaly and coronary artery disease. 5. Enlargement of the main pulmonary artery up to 3.6 cm, as can be seen in pulmonary hypertension. 6. Aortic Atherosclerosis (ICD10-I70.0).  Electronically Signed   By: AEddie CandleM.D.   On: 10/09/2019 13:38  Assessment  The primary encounter diagnosis was  Chronic pain syndrome. Diagnoses of Lumbar facet arthropathy (Bilateral), Lumbar facet syndrome (Bilateral) (R>L), DDD (degenerative disc disease), lumbar, Chronic low back pain (Secondary Area of Pain) (Bilateral) (R>L), Lumbar central spinal stenosis (L4-5), Lumbar disc protrusion (Left: L5-S1) (Right: L1-2), Malignant neoplasm of sigmoid colon (HCC), Ankylosing spondylitis of multiple sites in spine ([place], and Lumbar foraminal stenosis (Bilateral: L4-5) (Left: L5-S1) were also pertinent to this visit.  Plan of Care   Ms. NCreola Cornhas a current medication list which includes the following long-term medication(s): albuterol, amlodipine, duloxetine, hydralazine, lovastatin, metoprolol succinate, montelukast, omeprazole, potassium chloride sa, and terazosin.  Pharmacotherapy (Medications Ordered): Meds ordered this encounter  Medications  . HYDROcodone-acetaminophen (NORCO) 7.5-325 MG tablet  Sig: Take 1 tablet by mouth 2 (two) times daily as needed for severe pain. Must last 30 days.    Dispense:  60 tablet    Refill:  0    Chronic Pain. (STOP Act - Not applicable). Fill one day early if closed on scheduled refill date.  Marland Kitchen HYDROcodone-acetaminophen (NORCO) 7.5-325 MG tablet    Sig: Take 1 tablet by mouth 2 (two) times daily as needed for severe pain. Must last 30 days.    Dispense:  60 tablet    Refill:  0    Chronic Pain. (STOP Act - Not applicable). Fill one day early if closed on scheduled refill date.   Follow-up plan:   Return in about 8 weeks (around 02/18/2020) for Medication Management, in person.    Recent Visits Date Type Provider Dept  11/19/19 Office Visit Gloria Santa, MD [facility]  11/12/19 Office Visit Gloria Santa, MD [facility]  Showing recent visits within past 90 days and meeting all other requirements   Today's Visits Date Type Provider Dept  12/24/19 Office Visit Gloria Santa, MD [facility]  Showing today's visits and  meeting all other requirements   Future Appointments No visits were found meeting these conditions.  Showing future appointments within next 90 days and meeting all other requirements   I discussed the assessment and treatment plan with the patient. The patient was provided an opportunity to ask questions and all were answered. The patient agreed with the plan and demonstrated an understanding of the instructions.  Patient advised to call back or seek an in-person evaluation if the symptoms or condition worsens.  Duration of encounter: 25 minutes.  Note by: Gloria Santa, MD Date: 12/24/2019; Time: 9:25 AM

## 2019-12-25 ENCOUNTER — Telehealth: Payer: Self-pay | Admitting: Nurse Practitioner

## 2019-12-25 NOTE — Telephone Encounter (Signed)
I called Gloria Allen after her declining to schedule admission visit for hospice. Gloria. Meder and I talked about purpose or palliative care to refer to hospice. We talked about services hospice provides. We talked about chronic disease progression. We talked about her appetite and weight loss. Gloria Goold endorses that she has been thinking about it a lot and she wishes right now to wait on hospice and continue palliative care services. Gloria Westbay endorses that she wants to go ahead and try to gain weight on her own she believes she can get better. Gloria Klunder was open to scheduling follow-up palliative care visit. We talked about medical goals of care. Will continue to talk about medical goals of care, chronic disease progression.  Total time spent 20 minutes  Documentation 5 minutes  Phone discussion 15 minutes

## 2020-01-15 ENCOUNTER — Ambulatory Visit: Payer: Medicare Other | Admitting: Urology

## 2020-01-15 ENCOUNTER — Telehealth: Payer: Self-pay | Admitting: Nurse Practitioner

## 2020-01-15 NOTE — Telephone Encounter (Signed)
Spoke with patient to see if we could reschedule her 01/18/20 Palliative f/u visit and she was okay to do this.  I have rescheduled it for 02/09/20 @ 12 Noon.

## 2020-01-18 ENCOUNTER — Other Ambulatory Visit: Payer: Medicare Other | Admitting: Nurse Practitioner

## 2020-01-25 ENCOUNTER — Telehealth: Payer: Self-pay

## 2020-01-25 NOTE — Telephone Encounter (Signed)
She is saying that [facility] told her she doesn't have another script there. According to last visit she should have had enough called out to last until May 18. Can someone check into this?

## 2020-01-25 NOTE — Telephone Encounter (Signed)
Patient informed that there is a script at the pharmacy available to be filled as of yesterday.

## 2020-01-29 ENCOUNTER — Encounter: Payer: Self-pay | Admitting: Urology

## 2020-01-29 ENCOUNTER — Other Ambulatory Visit: Payer: Self-pay

## 2020-01-29 ENCOUNTER — Ambulatory Visit (INDEPENDENT_AMBULATORY_CARE_PROVIDER_SITE_OTHER): Payer: Medicare Other | Admitting: Urology

## 2020-01-29 VITALS — BP 86/52 | HR 80 | Ht 65.0 in | Wt 154.7 lb

## 2020-01-29 DIAGNOSIS — I10 Essential (primary) hypertension: Secondary | ICD-10-CM | POA: Diagnosis not present

## 2020-01-29 DIAGNOSIS — R350 Frequency of micturition: Secondary | ICD-10-CM

## 2020-01-29 DIAGNOSIS — R8271 Bacteriuria: Secondary | ICD-10-CM | POA: Diagnosis not present

## 2020-01-29 NOTE — Patient Instructions (Signed)

## 2020-01-29 NOTE — Progress Notes (Signed)
01/29/2020 11:31 AM   Gloria Allen June 08, 1946 [id number]  Referring provider: [name], MD [address] [place],  [place] [address]  Chief Complaint  Patient presents with  . Urinary Frequency    HPI:  Gloria Allen was referred over for urinary frequency and a report of blood in the urine 11/2019, but there was trace blood on the dip and 0-2 red blood cells per high-powered field on the urinalysis confirming no MH.  Culture grew mixed flora.  Her creatinine was 0.69. She had some frequency and urine odor but a lot of this resolved. No gross hematuria. No constipation.   No NG risk. She drinks some soda and not a lot of water. She had a partial Hx.   H/o colon and lung Ca.   She is on 5 BP meds and K replacement. Adrenals on CT Dec 2020 normal but were full appearing. She is a little dizzy today but no CP or SOB. Ambulation stable.   PMH: Past Medical History:  Diagnosis Date  . Anxiety   . Arthritis   . Asthma   . Cervical central spinal stenosis (C3-C7) (worse at C4-5) 07/30/2017  . Cervical foraminal stenosis (C4-5 and C5-6) (Bilateral) (L>R) 07/30/2017  . Chronic lower extremity pain (Fourth Area of Pain) (Bilateral) (R>L) 06/11/2017  . Chronic neck pain (Primary Area of Pain) (Bilateral) (R>L) 06/11/2017  . Chronic sacroiliac joint pain (Bilateral) (R>L) 06/11/2017  . Chronic shoulder pain [facility] Area of Pain) (Right) 06/11/2017  . Chronic upper extremity pain (Fifth Area of Pain) (Bilateral) (R>L) 06/11/2017  . Colon cancer ([place]) 11/2018   Partial colon resection  . DDD (degenerative disc disease), cervical 07/30/2017  . DDD (degenerative disc disease), lumbar 07/30/2017  . Depression   . DISH (diffuse idiopathic skeletal hyperostosis) 07/30/2017  . Dyspnea    with exertion  . Entrapment syndrome 06/20/2017   2002 on R and 2010 on L  . Full thickness rotator cuff tear 06/12/2017  . GERD (gastroesophageal reflux disease)   . Grade 1 Anterolisthesis of L4  over L5 07/30/2017  . Headache   . Hx: UTI (urinary tract infection)   . Hypertension   . Inflammation of joint of shoulder region 01/15/2017  . Lumbar central spinal stenosis (L4-5) 07/30/2017  . Lumbar disc protrusion (Left: L5-S1) (Right: L1-2) 07/30/2017   L5-S1 left foraminal protrusion with L5 impingement. L1-2 right paracentral protrusion without impingement.  . Lumbar facet arthropathy (Bilateral) 07/30/2017  . Lumbar facet syndrome (Bilateral) (R>L) 07/30/2017  . Lung cancer ([place]) 11/2018   Chemo  and rad tx's  . Osteoarthritis of shoulder (Right) 01/15/2017    Surgical History: Past Surgical History:  Procedure Laterality Date  . ABDOMINAL HYSTERECTOMY    . APPENDECTOMY    . BREAST REDUCTION SURGERY Bilateral   . COLONOSCOPY WITH PROPOFOL N/A 11/05/2018   Procedure: COLONOSCOPY WITH PROPOFOL;  Surgeon: Virgel Manifold, MD;  Location: [facility] ENDOSCOPY;  Service: Endoscopy;  Laterality: N/A;  . COLONOSCOPY WITH PROPOFOL N/A 11/06/2018   Procedure: COLONOSCOPY WITH PROPOFOL;  Surgeon: Virgel Manifold, MD;  Location: [facility] ENDOSCOPY;  Service: Endoscopy;  Laterality: N/A;  . COLONOSCOPY WITH PROPOFOL N/A 07/15/2019   Procedure: COLONOSCOPY WITH PROPOFOL;  Surgeon: Virgel Manifold, MD;  Location: [facility] ENDOSCOPY;  Service: Endoscopy;  Laterality: N/A;  . COLOSTOMY    . DILATION AND CURETTAGE OF UTERUS    . ESOPHAGOGASTRODUODENOSCOPY (EGD) WITH PROPOFOL N/A 11/05/2018   Procedure: ESOPHAGOGASTRODUODENOSCOPY (EGD) WITH PROPOFOL;  Surgeon: Virgel Manifold, MD;  Location: [facility] ENDOSCOPY;  Service: Endoscopy;  Laterality: N/A;  . ESOPHAGOGASTRODUODENOSCOPY (EGD) WITH PROPOFOL N/A 03/27/2019   Procedure: ESOPHAGOGASTRODUODENOSCOPY (EGD) WITH PROPOFOL;  Surgeon: Lucilla Lame, MD;  Location[facility] ENDOSCOPY;  Service: Endoscopy;  Laterality: N/A;  . ILEOSTOMY Right 11/27/2018   Procedure: ILEOSTOMY;  Surgeon: Jules Husbands, MD;  Location: [facility] ORS;  Service: General;  Laterality:  Right;  . ILEOSTOMY CLOSURE N/A 08/18/2019   Procedure: ILEOSTOMY TAKEDOWN;  Surgeon: Jules Husbands, MD;  Location: [facility] ORS;  Service: General;  Laterality: N/A;  . LAPAROSCOPIC SIGMOID COLECTOMY N/A 11/27/2018   Procedure: LAPAROSCOPIC SIGMOID COLECTOMY;  Surgeon: Jules Husbands, MD;  Location: [facility] ORS;  Service: General;  Laterality: N/A;  . ORIF ANKLE FRACTURE Right 11/21/2018   Procedure: OPEN REDUCTION INTERNAL FIXATION (ORIF) ANKLE FRACTURE;  Surgeon: Corky Mull, MD;  Location: [facility] ORS;  Service: Orthopedics;  Laterality: Right;  . PORT-A-CATH REMOVAL Right 08/18/2019   Procedure: REMOVAL PORT-A-CATH;  Surgeon: Jules Husbands, MD;  Location: [facility] ORS;  Service: General;  Laterality: Right;  . PORTACATH PLACEMENT N/A 03/12/2019   Procedure: INSERTION PORT-A-CATH;  Surgeon: Jules Husbands, MD;  Location: [facility] ORS;  Service: General;  Laterality: N/A;  . REDUCTION MAMMAPLASTY    . SHOULDER ARTHROSCOPY WITH OPEN ROTATOR CUFF REPAIR AND DISTAL CLAVICLE ACROMINECTOMY Right 03/26/2017   Procedure: right shoulder arthroscopy, arthroscopic subacromial decompression, distal clavicle excision, mini open rotator cuff repair;  Surgeon: Thornton Park, MD;  Location: [facility] ORS;  Service: Orthopedics;  Laterality: Right;    Home Medications:  Allergies as of 01/29/2020      Reactions   Ace Inhibitors Swelling   Angiotensin Receptor Blockers Swelling   Sulfa Antibiotics Itching   Chlorthalidone Rash   Flexeril [cyclobenzaprine] Rash      Medication List       Accurate as of January 29, 2020 11:31 AM. If you have any questions, ask your nurse or doctor.        albuterol 108 (90 Base) MCG/ACT inhaler Commonly known as: VENTOLIN HFA Inhale 2 puffs into the lungs every 6 (six) hours as needed for wheezing or shortness of breath.   amLODipine 10 MG tablet Commonly known as: NORVASC Take 10 mg by mouth daily.   Breo Ellipta 200-25 MCG/INH Aepb Generic drug: fluticasone  furoate-vilanterol Inhale 1 puff into the lungs daily. What changed: when to take this   chlorthalidone 25 MG tablet Commonly known as: HYGROTON Take 25 mg by mouth daily.   cloNIDine 0.3 MG tablet Commonly known as: CATAPRES Take 0.3 mg by mouth 2 (two) times daily.   cyclobenzaprine 10 MG tablet Commonly known as: FLEXERIL Take 10 mg by mouth at bedtime.   DULoxetine 30 MG capsule Commonly known as: CYMBALTA Take 30 mg by mouth daily.   hydrALAZINE 100 MG tablet Commonly known as: APRESOLINE Take 1 tablet (100 mg total) by mouth 3 (three) times daily.   HYDROcodone-acetaminophen 7.5-325 MG tablet Commonly known as: Norco Take 1 tablet by mouth 2 (two) times daily as needed for severe pain. Must last 30 days.   hydrOXYzine 10 MG tablet Commonly known as: ATARAX/VISTARIL Take 1 tablet (10 mg total) by mouth 3 (three) times daily as needed for itching.   loperamide 2 MG capsule Commonly known as: IMODIUM Take 4 mg by mouth 3 (three) times daily.   lovastatin 40 MG tablet Commonly known as: MEVACOR Take 1 tablet (40 mg total) by mouth at bedtime.  meclizine 12.5 MG tablet Commonly known as: ANTIVERT Take 1 tablet (12.5 mg total) by mouth 3 (three) times daily as needed for dizziness.   megestrol 20 MG tablet Commonly known as: MEGACE Take 1 tablet (20 mg total) by mouth daily.   metoprolol succinate 100 MG 24 hr tablet Commonly known as: TOPROL-XL Take 100 mg by mouth daily. What changed: Another medication with the same name was removed. Continue taking this medication, and follow the directions you see here. Changed by: Festus Aloe, MD   metroNIDAZOLE 500 MG tablet Commonly known as: FLAGYL Take 500 mg by mouth 2 (two) times daily.   mirtazapine 15 MG tablet Commonly known as: REMERON Take 15 mg by mouth at bedtime.   mirtazapine 15 MG disintegrating tablet Commonly known as: REMERON SOL-TAB Take 15 mg by mouth at bedtime.   montelukast 10 MG  tablet Commonly known as: SINGULAIR Take 1 tablet (10 mg total) by mouth at bedtime as needed.   omeprazole 40 MG capsule Commonly known as: PRILOSEC Take 1 capsule (40 mg total) by mouth 2 (two) times daily.   ondansetron 8 MG tablet Commonly known as: Zofran Take 1 tablet (8 mg total) by mouth every 8 (eight) hours as needed for nausea. Start on day 3 after carboplatin chemo.   potassium chloride SA 20 MEQ tablet Commonly known as: KLOR-CON Take 1 tablet (20 mEq total) by mouth 2 (two) times daily. What changed: when to take this   terazosin 1 MG capsule Commonly known as: HYTRIN Take 1 capsule (1 mg total) by mouth at bedtime.   traZODone 50 MG tablet Commonly known as: DESYREL Take 50 mg by mouth at bedtime.       Allergies:  Allergies  Allergen Reactions  . Ace Inhibitors Swelling  . Angiotensin Receptor Blockers Swelling  . Sulfa Antibiotics Itching  . Chlorthalidone Rash  . Flexeril [Cyclobenzaprine] Rash    Family History: Family History  Problem Relation Age of Onset  . Hypertension Mother   . Breast cancer Mother 40  . Breast cancer Sister 69    Social History:  reports that she quit smoking about 11 years ago. Her smoking use included cigarettes. She has a 15.00 pack-year smoking history. She has never used smokeless tobacco. She reports current drug use. Drug: Marijuana. She reports that she does not drink alcohol.   Physical Exam: BP (!) 86/52 (BP Location: Left Arm, Patient Position: Sitting, Cuff Size: Normal)   Pulse 80   Ht 5\' 5"  (1.651 m)   Wt 154 lb 11.2 oz (70.2 kg)   BMI 25.74 kg/m   Constitutional:  Alert and oriented, No acute distress. HEENT: [place] AT, moist mucus membranes.  Trachea midline, no masses. Cardiovascular: No clubbing, cyanosis, or edema. Respiratory: Normal respiratory effort, no increased work of breathing. GI: Abdomen is soft, nontender, nondistended, no abdominal masses GU: No CVA tenderness Lymph: No cervical or  inguinal lymphadenopathy. Skin: No rashes, bruises or suspicious lesions. Neurologic: Grossly intact, no focal deficits, moving all 4 extremities. Psychiatric: Normal mood and affect.  Laboratory Data: Lab Results  Component Value Date   WBC 5.2 12/08/2019   HGB 11.1 (L) 12/08/2019   HCT 35.9 (L) 12/08/2019   MCV 86.1 12/08/2019   PLT 265 12/08/2019    Lab Results  Component Value Date   CREATININE 0.54 12/08/2019    No results found for: PSA  No results found for: TESTOSTERONE  Lab Results  Component Value Date   HGBA1C 6.5 (H) 08/22/2019  Urinalysis    Component Value Date/Time   COLORURINE YELLOW (A) 08/24/2019 0642   APPEARANCEUR CLEAR (A) 08/24/2019 0642   APPEARANCEUR Hazy (A) 05/19/2018 0931   LABSPEC 1.024 08/24/2019 0642   PHURINE 5.0 08/24/2019 0642   GLUCOSEU NEGATIVE 08/24/2019 0642   HGBUR NEGATIVE 08/24/2019 0642   BILIRUBINUR NEGATIVE 08/24/2019 0642   BILIRUBINUR Negative 05/19/2018 0931   KETONESUR 20 (A) 08/24/2019 0642   PROTEINUR NEGATIVE 08/24/2019 0642   NITRITE NEGATIVE 08/24/2019 0642   LEUKOCYTESUR NEGATIVE 08/24/2019 0642    Lab Results  Component Value Date   LABMICR See below: 04/22/2018   WBCUA >30 (A) 04/22/2018   RBCUA 0-2 04/22/2018   LABEPIT 0-10 04/22/2018   BACTERIA RARE (A) 03/24/2019    Pertinent Imaging: CT Dec 2020  No results found for this or any previous visit. Results for orders placed during the hospital encounter of 08/18/19  US Venous Img Lower Bilateral (DVT)   Narrative CLINICAL DATA:  Dyspnea.  Inpatient.  EXAM: BILATERAL LOWER EXTREMITY VENOUS DOPPLER ULTRASOUND  TECHNIQUE: Gray-scale sonography with graded compression, as well as color Doppler and duplex ultrasound were performed to evaluate the lower extremity deep venous systems from the level of the common femoral vein and including the common femoral, femoral, profunda femoral, popliteal and calf veins including the posterior tibial,  peroneal and gastrocnemius veins when visible. The superficial great saphenous vein was also interrogated. Spectral Doppler was utilized to evaluate flow at rest and with distal augmentation maneuvers in the common femoral, femoral and popliteal veins.  COMPARISON:  None.  FINDINGS: RIGHT LOWER EXTREMITY  Common Femoral Vein: No evidence of thrombus. Normal compressibility, respiratory phasicity and response to augmentation.  Saphenofemoral Junction: No evidence of thrombus. Normal compressibility and flow on color Doppler imaging.  Profunda Femoral Vein: No evidence of thrombus. Normal compressibility and flow on color Doppler imaging.  Femoral Vein: No evidence of thrombus. Normal compressibility, respiratory phasicity and response to augmentation.  Popliteal Vein: No evidence of thrombus. Normal compressibility, respiratory phasicity and response to augmentation.  Calf Veins: No evidence of thrombus. Normal compressibility and flow on color Doppler imaging.  Superficial Great Saphenous Vein: No evidence of thrombus. Normal compressibility.  Venous Reflux:  None.  Other Findings:  None.  LEFT LOWER EXTREMITY  Common Femoral Vein: No evidence of thrombus. Normal compressibility, respiratory phasicity and response to augmentation.  Saphenofemoral Junction: No evidence of thrombus. Normal compressibility and flow on color Doppler imaging.  Profunda Femoral Vein: No evidence of thrombus. Normal compressibility and flow on color Doppler imaging.  Femoral Vein: No evidence of thrombus. Normal compressibility, respiratory phasicity and response to augmentation.  Popliteal Vein: No evidence of thrombus. Normal compressibility, respiratory phasicity and response to augmentation.  Calf Veins: No evidence of thrombus. Normal compressibility and flow on color Doppler imaging.  Superficial Great Saphenous Vein: No evidence of thrombus. Normal compressibility.  Venous  Reflux:  None.  Other Findings:  None.  IMPRESSION: No evidence of deep venous thrombosis in either lower extremity.   Electronically Signed   By: Ilona Sorrel M.D.   On: 08/22/2019 18:33    No results found for this or any previous visit. No results found for this or any previous visit. No results found for this or any previous visit. No results found for this or any previous visit. No results found for this or any previous visit. No results found for this or any previous visit.  Assessment & Plan:    1. Urinary frequency Improved.  Also discussed to drink more water even with some lemon.  2. Bacteriuria - again discussed since she has no bladder pain or dysuria, I would not recommend abx for bacteria or urine odor in line with ID guidelines.   3. Hypokalemia, HTN - she is on 5 BP meds and takes K supplement. Her adrenal imaging was normal in CT Dec 2020. I want her to see endocrine to check for an adrenal cause like a pheo or primary aldosterone issue.  - Urinalysis, Complete   No follow-ups on file.  Festus Aloe, MD  [facility] [address] [place], [place] [address] [phone]

## 2020-02-01 LAB — URINALYSIS, COMPLETE
Bilirubin, UA: NEGATIVE
Glucose, UA: NEGATIVE
Nitrite, UA: NEGATIVE
Protein,UA: NEGATIVE
RBC, UA: NEGATIVE
Specific Gravity, UA: >1.03 — ABNORMAL HIGH (ref 1.005–1.030)
Urobilinogen, Ur: 0.2 mg/dL (ref 0.2–1.0)
pH, UA: 5 (ref 5.0–7.5)

## 2020-02-01 LAB — MICROSCOPIC EXAMINATION: Epithelial Cells (non renal): >10 /hpf — AB (ref 0–10)

## 2020-02-03 ENCOUNTER — Telehealth: Payer: Self-pay

## 2020-02-03 ENCOUNTER — Other Ambulatory Visit: Payer: Self-pay | Admitting: *Deleted

## 2020-02-03 ENCOUNTER — Telehealth: Payer: Self-pay | Admitting: *Deleted

## 2020-02-03 DIAGNOSIS — G894 Chronic pain syndrome: Secondary | ICD-10-CM

## 2020-02-03 DIAGNOSIS — M47816 Spondylosis without myelopathy or radiculopathy, lumbar region: Secondary | ICD-10-CM

## 2020-02-03 NOTE — Telephone Encounter (Signed)
She wants to know if Dr. Holley Raring will write her an order for a lift chair/ Please call her

## 2020-02-03 NOTE — Telephone Encounter (Signed)
Dr. Holley Raring- Please advise.

## 2020-02-03 NOTE — Telephone Encounter (Signed)
Patient asking about the prescription for a lift chair. I told her this type of prescription cannot be sent electronically, but it is here and she may come pick it up.

## 2020-02-03 NOTE — Telephone Encounter (Signed)
Pt returned the call; however, she do not know where she wants her script sent, but is requesting for a nurse to return her call.                                           Thanks

## 2020-02-03 NOTE — Telephone Encounter (Signed)
Order printed and patient called. No answer.LVM to pick up or let us know where to send it.

## 2020-02-03 NOTE — Telephone Encounter (Signed)
Order placed in script book.

## 2020-02-09 ENCOUNTER — Encounter: Payer: Self-pay | Admitting: Nurse Practitioner

## 2020-02-09 ENCOUNTER — Other Ambulatory Visit: Payer: Self-pay

## 2020-02-09 ENCOUNTER — Other Ambulatory Visit: Payer: Medicare Other | Admitting: Nurse Practitioner

## 2020-02-09 DIAGNOSIS — C3490 Malignant neoplasm of unspecified part of unspecified bronchus or lung: Secondary | ICD-10-CM

## 2020-02-09 DIAGNOSIS — Z515 Encounter for palliative care: Secondary | ICD-10-CM

## 2020-02-09 NOTE — Progress Notes (Signed)
[employer] [facility] Palliative Care Consult Note Telephone: [phone]  Fax: [phone]  PATIENT NAME: Gloria Allen DOB: April 24, 1946 MRN: [id number]  PRIMARY CARE PROVIDER:   [name], MD  REFERRING PROVIDER:  [name], MD [address] [place],  [place] [address]  RESPONSIBLE PARTY:Self  Due to the COVID-19 crisis, this visit was done via telemedicine from my office and it was initiated and consent by this patient and or family.  RECOMMENDATIONS and PLAN: 1.ACP: DNR; blank MOST form and hard choice book to review at next [facility] visit as currently Gloria. Allen is dealing with loss of her cousin, cousin's son  2.Pain secondary to Malignant neoplasm of colon s/p partial resection with ileostomy/colostomy, small cell lung cancer s/p chemotherapy/radiationwill continue to monitor on pain scale, monitor efficacy vs adverse side effects.Continue with pain management appointment  3.Palliative care encounter Palliative medicine team will continue to support patient, patient's family, and medical team. Visit consisted of counseling and education dealing with the complex and emotionally intense issues of symptom management and palliative care in the setting of serious and potentially life-threatening illness  I spent 50 minutes providing this consultation,  from 12:00pm to 12:50pm. More than 50% of the time in this consultation was spent coordinating communication.   HISTORY OF PRESENT ILLNESS:  Gloria Allen is a 74 y.o. year old female with multiple medical problems including Malignant neoplasm of colon s/p partial resection with ileostomy/colostomy, small cell lung cancer s/p chemotherapy/radiation, hypertension, gerd, headache, degenerative disc disease,asthma, arthritis, lumbar facet syndrome, lumbar disc protrusion, anxiety, depression, PTSD, right shoulder arthroscopy, reduction mammoplasty, port-a-cath placement, right orif ankle,  appendectomy, abdominal hysterectomy, D&C of uterus. I called Gloria Allen for scheduled telemedicine telephonic is video not available follow-up palliative care visit. Gloria. Allen and I talked about purpose of palliative care visit. Gloria Allen in agreement. We talked about how she was feeling. Gloria Allen endorses that she has not been feeling very good. Gloria Allen endorses that she has had some family trauma including the passing of her cousin and then her cousins brother shot and killed his son. We talked about the event. We talked about grieving. We talked about coping strategies. Therapeutic listening, emotional support provided. We talked about symptoms of pain for what she does continue to follow at the pain clinic. We talked about her appetite. We talked about her weight as she has gained. Praised Gloria Allen for eating, gaining weight. Gloria Allen and I talked at length about nutrition. Gloria Allen endorses that she is aware of the importance that she does need to continue to gain weight. We talked about fatigue and weakness. We talked about medical goals of care. We talked about family dynamics. We talked about role of palliative care in plan of care. Discuss no new recommendations at present time will continue to follow a monitor with next visit in 2 months if needed or sooner should she declined. Gloria Allen in agreement and appointment schedule. Therapeutic listening and emotional support provided. Contact information provided. Questions answered to satisfaction. Palliative Care was asked to help to continue to address goals of care.   CODE STATUS: DNR  PPS: 50% HOSPICE ELIGIBILITY/DIAGNOSIS: TBD  PAST MEDICAL HISTORY:  Past Medical History:  Diagnosis Date  . Anxiety   . Arthritis   . Asthma   . Cervical central spinal stenosis (C3-C7) (worse at C4-5) 07/30/2017  . Cervical foraminal stenosis (C4-5 and C5-6) (Bilateral) (L>R) 07/30/2017  . Chronic lower  extremity pain (Fourth Area of Pain) (Bilateral) (R>L)  06/11/2017  . Chronic neck pain (Primary Area of Pain) (Bilateral) (R>L) 06/11/2017  . Chronic sacroiliac joint pain (Bilateral) (R>L) 06/11/2017  . Chronic shoulder pain [facility] Area of Pain) (Right) 06/11/2017  . Chronic upper extremity pain (Fifth Area of Pain) (Bilateral) (R>L) 06/11/2017  . Colon cancer ([place]) 11/2018   Partial colon resection  . DDD (degenerative disc disease), cervical 07/30/2017  . DDD (degenerative disc disease), lumbar 07/30/2017  . Depression   . DISH (diffuse idiopathic skeletal hyperostosis) 07/30/2017  . Dyspnea    with exertion  . Entrapment syndrome 06/20/2017   2002 on R and 2010 on L  . Full thickness rotator cuff tear 06/12/2017  . GERD (gastroesophageal reflux disease)   . Grade 1 Anterolisthesis of L4 over L5 07/30/2017  . Headache   . Hx: UTI (urinary tract infection)   . Hypertension   . Inflammation of joint of shoulder region 01/15/2017  . Lumbar central spinal stenosis (L4-5) 07/30/2017  . Lumbar disc protrusion (Left: L5-S1) (Right: L1-2) 07/30/2017   L5-S1 left foraminal protrusion with L5 impingement. L1-2 right paracentral protrusion without impingement.  . Lumbar facet arthropathy (Bilateral) 07/30/2017  . Lumbar facet syndrome (Bilateral) (R>L) 07/30/2017  . Lung cancer ([place]) 11/2018   Chemo  and rad tx's  . Osteoarthritis of shoulder (Right) 01/15/2017    SOCIAL HX:  Social History   Tobacco Use  . Smoking status: Former Smoker    Packs/day: 1.00    Years: 15.00    Pack years: 15.00    Types: Cigarettes    Quit date: 2010    Years since quitting: 11.3  . Smokeless tobacco: Never Used  . Tobacco comment: quit over 30 years ago   Substance Use Topics  . Alcohol use: No    ALLERGIES:  Allergies  Allergen Reactions  . Ace Inhibitors Swelling  . Angiotensin Receptor Blockers Swelling  . Sulfa Antibiotics Itching  . Chlorthalidone Rash  . Flexeril [Cyclobenzaprine] Rash     PERTINENT MEDICATIONS:  Outpatient Encounter  Medications as of 02/09/2020  Medication Sig  . albuterol (VENTOLIN HFA) 108 (90 Base) MCG/ACT inhaler Inhale 2 puffs into the lungs every 6 (six) hours as needed for wheezing or shortness of breath.  Marland Kitchen amLODipine (NORVASC) 10 MG tablet Take 10 mg by mouth daily.  Marland Kitchen BREO ELLIPTA 200-25 MCG/INH AEPB Inhale 1 puff into the lungs daily. (Patient taking differently: Inhale 1 puff into the lungs every morning. )  . chlorthalidone (HYGROTON) 25 MG tablet Take 25 mg by mouth daily.  . cloNIDine (CATAPRES) 0.3 MG tablet Take 0.3 mg by mouth 2 (two) times daily.  . cyclobenzaprine (FLEXERIL) 10 MG tablet Take 10 mg by mouth at bedtime.  . DULoxetine (CYMBALTA) 30 MG capsule Take 30 mg by mouth daily.  . hydrALAZINE (APRESOLINE) 100 MG tablet Take 1 tablet (100 mg total) by mouth 3 (three) times daily.  Marland Kitchen HYDROcodone-acetaminophen (NORCO) 7.5-325 MG tablet Take 1 tablet by mouth 2 (two) times daily as needed for severe pain. Must last 30 days.  . hydrOXYzine (ATARAX/VISTARIL) 10 MG tablet Take 1 tablet (10 mg total) by mouth 3 (three) times daily as needed for itching.  . loperamide (IMODIUM) 2 MG capsule Take 4 mg by mouth 3 (three) times daily.  Marland Kitchen lovastatin (MEVACOR) 40 MG tablet Take 1 tablet (40 mg total) by mouth at bedtime.  . meclizine (ANTIVERT) 12.5 MG tablet Take 1 tablet (12.5 mg total) by mouth  3 (three) times daily as needed for dizziness.  . megestrol (MEGACE) 20 MG tablet Take 1 tablet (20 mg total) by mouth daily.  . metoprolol succinate (TOPROL-XL) 100 MG 24 hr tablet Take 100 mg by mouth daily.  . metroNIDAZOLE (FLAGYL) 500 MG tablet Take 500 mg by mouth 2 (two) times daily.  . mirtazapine (REMERON SOL-TAB) 15 MG disintegrating tablet Take 15 mg by mouth at bedtime.  . mirtazapine (REMERON) 15 MG tablet Take 15 mg by mouth at bedtime.  . montelukast (SINGULAIR) 10 MG tablet Take 1 tablet (10 mg total) by mouth at bedtime as needed.  Marland Kitchen omeprazole (PRILOSEC) 40 MG capsule Take 1 capsule  (40 mg total) by mouth 2 (two) times daily.  . ondansetron (ZOFRAN) 8 MG tablet Take 1 tablet (8 mg total) by mouth every 8 (eight) hours as needed for nausea. Start on day 3 after carboplatin chemo.  . potassium chloride SA (K-DUR) 20 MEQ tablet Take 1 tablet (20 mEq total) by mouth 2 (two) times daily. (Patient taking differently: Take 20 mEq by mouth daily. )  . terazosin (HYTRIN) 1 MG capsule Take 1 capsule (1 mg total) by mouth at bedtime.  . traZODone (DESYREL) 50 MG tablet Take 50 mg by mouth at bedtime.   Facility-Administered Encounter Medications as of 02/09/2020  Medication  . heparin lock flush 100 unit/mL  . heparin lock flush 100 unit/mL  . sodium chloride flush (NS) 0.9 % injection 10 mL  . sodium chloride flush (NS) 0.9 % injection 10 mL  . sodium chloride flush (NS) 0.9 % injection 10 mL  . sodium chloride flush (NS) 0.9 % injection 10 mL    PHYSICAL EXAM:  Deferred  Chivon Lepage Z Francisca Harbuck, NP

## 2020-02-16 ENCOUNTER — Ambulatory Visit
Payer: Medicare Other | Attending: Student in an Organized Health Care Education/Training Program | Admitting: Student in an Organized Health Care Education/Training Program

## 2020-02-16 ENCOUNTER — Other Ambulatory Visit: Payer: Self-pay

## 2020-02-16 ENCOUNTER — Encounter: Payer: Self-pay | Admitting: Student in an Organized Health Care Education/Training Program

## 2020-02-16 DIAGNOSIS — M450 Ankylosing spondylitis of multiple sites in spine: Secondary | ICD-10-CM | POA: Insufficient documentation

## 2020-02-16 DIAGNOSIS — G894 Chronic pain syndrome: Secondary | ICD-10-CM | POA: Diagnosis not present

## 2020-02-16 DIAGNOSIS — M48062 Spinal stenosis, lumbar region with neurogenic claudication: Secondary | ICD-10-CM | POA: Insufficient documentation

## 2020-02-16 MED ORDER — HYDROCODONE-ACETAMINOPHEN 7.5-325 MG PO TABS: 1.0000 | ORAL_TABLET | Freq: Three times a day (TID) | ORAL | 0 refills | Status: AC | PRN

## 2020-02-16 MED ORDER — HYDROCODONE-ACETAMINOPHEN 7.5-325 MG PO TABS: 1.0000 | ORAL_TABLET | Freq: Three times a day (TID) | ORAL | 0 refills | Status: DC | PRN

## 2020-02-16 NOTE — Progress Notes (Signed)
Safety precautions to be maintained throughout the outpatient stay will include: orient to surroundings, keep bed in low position, maintain call bell within reach at all times, provide assistance with transfer out of bed and ambulation.  

## 2020-02-16 NOTE — Progress Notes (Signed)
PROVIDER NOTE: Information contained herein reflects review and annotations entered in association with encounter. Interpretation of such information and data should be left to medically-trained personnel. Information provided to patient can be located elsewhere in the medical record under "Patient Instructions". Document created using STT-dictation technology, any transcriptional errors that may result from process are unintentional.    Patient: Gloria Allen  Service Category: E/M  Provider: Gillis Santa, MD  DOB: Aug 18, 1946  DOS: 02/16/2020  Referring Provider: [name], MD  MRN: [id number]  Setting: Ambulatory outpatient  PCP: Gloria Morgan, MD  Type: Established Patient  Specialty: Interventional Pain Management    Location: Office  Delivery: Face-to-face     Primary Reason(s) for Visit: Encounter for prescription drug management. (Level of risk: moderate)  CC: Back Pain (low right) and Shoulder Pain (right)  HPI  Gloria Allen is a 74 y.o. year old, female patient, who comes today for a medication management evaluation. She has Anxiety; Depression; GERD (gastroesophageal reflux disease); Benign hypertensive renal disease; Long term current use of opiate analgesic; Chronic pain syndrome; PTSD (post-traumatic stress disorder); Cervical arthritis; Bronchial asthma; Lumbar facet arthropathy (Bilateral); Lumbar central spinal stenosis (L4-5); Lumbar foraminal stenosis (Bilateral: L4-5) (Left: L5-S1); Osteopenia of spine; Suspected exposure to mold; Poor historian; Laryngopharyngeal reflux (LPR); Ataxia; Atypical chest pain; SOB (shortness of breath); IFG (impaired fasting glucose); Special screening for malignant neoplasms, colon; Maculopapular rash; Colon neoplasm; Melena; Columnar epithelial-lined lower esophagus; Stomach irritation; Benign neoplasm of ascending colon; Malignant neoplasm of sigmoid colon ([place]); Melanosis, colon; Ankylosing spondylitis of multiple sites in spine ([place]); Severe  episode of recurrent major depressive disorder, without psychotic features ([place]); Ankle fracture, right; S/P laparoscopic colectomy; Closed bimalleolar fracture of right ankle; Goals of care, counseling/discussion; Malignant neoplasm of colon ([place]); Small cell lung cancer ([place]); Essential hypertension; Hyperlipidemia; Sepsis ([place]); Blood in stool; Chemotherapy-induced nausea; Hypokalemia; S/P closure of ileostomy; and Aspiration pneumonia of right lung (HCC) on their problem list. Her primarily concern today is the Back Pain (low right) and Shoulder Pain (right)  Pain Assessment: Location: Lower Back Radiating: radiates down side of right leg to knee Onset: More than a month ago Duration: Chronic pain Quality: Aching Severity: 8 /10 (subjective, self-reported pain score)  Note: Reported level is inconsistent with clinical observations.  Effect on ADL: limits activities Timing: Constant Modifying factors: medictions BP: (!) 112/53  HR: 62  Gloria Allen was last scheduled for an appointment on 12/24/2019 for medication management. During today's appointment we reviewed Gloria Allen's chronic pain status, as well as her outpatient medication regimen.  The patient  reports current drug use. Drug: Marijuana. Her body mass index is 26.96 kg/m.  Further details on both, my assessment(s), as well as the proposed treatment plan, please see below.  Controlled Substance Pharmacotherapy Assessment REMS (Risk Evaluation and Mitigation Strategy)  Analgesic: 01/25/2020  2   12/24/2019  Hydrocodone-Acetamin 7.5-325  60.00  30 Bi Lat   [id number]   [place] (4231)   0  15.00 MME  Medicare        Gloria Shorter, RN  02/16/2020  2:56 PM  Signed Safety precautions to be maintained throughout the outpatient stay will include: orient to surroundings, keep bed in low position, maintain call bell within reach at all times, provide assistance with transfer out of bed and ambulation.   Pharmacokinetics: Liberation and  absorption (onset of action): WNL Distribution (time to peak effect): WNL Metabolism and excretion (duration of action): WNL  Pharmacodynamics: Desired effects: Analgesia: [name] reports 50% benefit. Functional ability: Patient reports that medication allows her to accomplish basic ADLs Clinically meaningful improvement in function (CMIF): Sustained CMIF goals met Perceived effectiveness: Described as relatively effective but with some room for improvement Undesirable effects: Side-effects or Adverse reactions: None reported Monitoring: [place] PMP: PDMP not reviewed this encounter. Online review of the past 55-monthperiod conducted. Compliant with practice rules and regulations Last UDS on record: Summary  Date Value Ref Range Status  11/12/2019 Note  Final    Comment:    ==================================================================== Compliance Drug Analysis, Ur ==================================================================== Test                             Result       Flag       Units Drug Present and Declared for Prescription Verification   Pregabalin                     PRESENT      EXPECTED   Mirtazapine                    PRESENT      EXPECTED   Sertraline                     PRESENT      EXPECTED   Desmethylsertraline            PRESENT      EXPECTED    Desmethylsertraline is an expected metabolite of sertraline.   Clonidine                      PRESENT      EXPECTED   Metoprolol                     PRESENT      EXPECTED Drug Present not Declared for Prescription Verification   7-aminoclonazepam              96           UNEXPECTED ng/mg creat    7-aminoclonazepam is an expected metabolite of clonazepam. Source of    clonazepam is a scheduled prescription medication. Drug Absent but Declared for Prescription Verification   Cyclobenzaprine                Not Detected UNEXPECTED   Ibuprofen                      Not Detected UNEXPECTED    Ibuprofen, as  indicated in the declared medication list, is not    always detected even when used as directed.   Hydroxyzine                    Not Detected UNEXPECTED   Lidocaine                      Not Detected UNEXPECTED    Lidocaine, as indicated in the declared medication list, is not    always detected even when used as directed. ==================================================================== Test                      Result    Flag   Units      Ref Range   Creatinine  81               mg/dL      >=20 ==================================================================== Declared Medications:  The flagging and interpretation on this report are based on the  following declared medications.  Unexpected results may arise from  inaccuracies in the declared medications.  **Note: The testing scope of this panel includes these medications:  Clonidine  Cyclobenzaprine (Flexeril)  Hydroxyzine  Metoprolol  Mirtazapine  Pregabalin  Sertraline  **Note: The testing scope of this panel does not include small to  moderate amounts of these reported medications:  Ibuprofen  Topical Lidocaine (EMLA)  **Note: The testing scope of this panel does not include the  following reported medications:  Albuterol  Amlodipine  Benzonatate  Chlorthalidone  Fluticasone (Breo)  Hydralazine  Loratadine  Lovastatin  Meclizine  Montelukast  Omeprazole  Ondansetron  Potassium (Klor-Con)  Prilocaine (EMLA)  Terazosin  Vilanterol (Breo) ==================================================================== For clinical consultation, please call [phone]. ====================================================================    UDS interpretation: Compliant          Medication Assessment Form: Reviewed. Patient indicates being compliant with therapy Treatment compliance: Compliant Risk Assessment Profile: Aberrant behavior: See initial evaluations. None observed or detected today Comorbid  factors increasing risk of overdose: See initial evaluation. No additional risks detected today Opioid risk tool (ORT):  Opioid Risk  02/16/2020  Alcohol 1  Illegal Drugs 0  Rx Drugs 0  Alcohol 0  Illegal Drugs 0  Rx Drugs 0  Age between 16-45 years  0  History of Preadolescent Sexual Abuse 0  Psychological Disease 0  ADD -  OCD -  Bipolar -  Depression 1  Opioid Risk Tool Scoring 2  Opioid Risk Interpretation Low Risk    ORT Scoring interpretation table:  Score <3 = Low Risk for SUD  Score between 4-7 = Moderate Risk for SUD  Score >8 = High Risk for Opioid Abuse   Risk of substance use disorder (SUD): Low  Risk Mitigation Strategies:  Patient Counseling: Covered Patient-Prescriber Agreement (PPA): Present and active  Notification to other healthcare providers: Done  Pharmacologic Plan: Increase hydrocodone from 7.5 mg twice daily as needed 3 times daily as needed so that patient can occasionally take additional dose for increased breakthrough pain.             Laboratory Chemistry Profile   Renal Lab Results  Component Value Date   BUN 12 12/08/2019   CREATININE 0.54 12/08/2019   BCR 15 08/21/2018   GFRAA >60 12/08/2019   GFRNONAA >60 12/08/2019   SPECGRAV >1.030 (H) 01/29/2020   PHUR 5.0 01/29/2020   PROTEINUR Negative 01/29/2020     Electrolytes Lab Results  Component Value Date   NA 135 12/08/2019   K 3.2 (L) 12/08/2019   CL 98 12/08/2019   CALCIUM 9.0 12/08/2019   MG 1.8 08/28/2019   PHOS 2.8 08/23/2019     Hepatic Lab Results  Component Value Date   AST 19 12/08/2019   ALT 15 12/08/2019   ALBUMIN 3.7 12/08/2019   ALKPHOS 106 12/08/2019   LIPASE 23 03/24/2019     ID Lab Results  Component Value Date   HIV Non Reactive 11/27/2018   SARSCOV2NAA NEGATIVE 08/21/2019   MRSAPCR NEGATIVE 08/23/2019     Bone Lab Results  Component Value Date   VD25OH 24.9 (L) 04/22/2018   25OHVITD1 24 (L) 06/11/2017   25OHVITD2 <1.0 06/11/2017   25OHVITD3  24 06/11/2017     Endocrine Lab Results  Component  Value Date   GLUCOSE 243 (H) 12/08/2019   GLUCOSEU Negative 01/29/2020   HGBA1C 6.5 (H) 08/22/2019   TSH 0.865 04/22/2018     Neuropathy Lab Results  Component Value Date   VITAMINB12 651 05/08/2019   FOLATE 32.0 05/08/2019   HGBA1C 6.5 (H) 08/22/2019   HIV Non Reactive 11/27/2018     CNS No results found for: COLORCSF, APPEARCSF, RBCCOUNTCSF, WBCCSF, POLYSCSF, LYMPHSCSF, EOSCSF, PROTEINCSF, GLUCCSF, JCVIRUS, CSFOLI, IGGCSF, LABACHR, ACETBL, LABACHR, ACETBL   Inflammation (CRP: Acute  ESR: Chronic) Lab Results  Component Value Date   CRP 8.5 (H) 06/11/2017   ESRSEDRATE 22 06/11/2017   LATICACIDVEN 1.9 03/25/2019     Rheumatology No results found for: RF, ANA, LABURIC, URICUR, LYMEIGGIGMAB, LYMEABIGMQN, HLAB27   Coagulation Lab Results  Component Value Date   INR 1.0 02/12/2019   LABPROT 13.5 02/12/2019   APTT 28 03/21/2017   PLT 265 12/08/2019     Cardiovascular Lab Results  Component Value Date   BNP 372.0 (H) 08/22/2019   TROPONINI <0.03 01/15/2018   HGB 11.1 (L) 12/08/2019   HCT 35.9 (L) 12/08/2019     Screening Lab Results  Component Value Date   SARSCOV2NAA NEGATIVE 08/21/2019   COVIDSOURCE NASOPHARYNGEAL 03/24/2019   MRSAPCR NEGATIVE 08/23/2019   HIV Non Reactive 11/27/2018     Cancer No results found for: CEA, CA125, LABCA2   Allergens No results found for: ALMOND, APPLE, ASPARAGUS, AVOCADO, BANANA, BARLEY, BASIL, BAYLEAF, GREENBEAN, LIMABEAN, WHITEBEAN, BEEFIGE, REDBEET, BLUEBERRY, BROCCOLI, CABBAGE, MELON, CARROT, CASEIN, CASHEWNUT, CAULIFLOWER, CELERY     Note: Lab results reviewed.   Recent Diagnostic Imaging Results  CT Chest W Contrast CLINICAL DATA:  Restaging lung cancer, colon cancer, status post chemo radiation  EXAM: CT CHEST WITH CONTRAST  TECHNIQUE: Multidetector CT imaging of the chest was performed during intravenous contrast administration.  CONTRAST:  60m  OMNIPAQUE IOHEXOL 300 MG/ML  SOLN  COMPARISON:  CT chest angiogram, 08/23/2019, CT chest, 06/05/2019, PET-CT, 02/05/2019  FINDINGS: Cardiovascular: Aortic atherosclerosis. Mild cardiomegaly. Left coronary artery calcifications. Enlargement of the main pulmonary artery up to 3.6 cm. No pericardial effusion.  Mediastinum/Nodes: No enlarged mediastinal, hilar, or axillary lymph nodes. Thyroid gland, trachea, and esophagus demonstrate no significant findings.  Lungs/Pleura: Mild centrilobular emphysema. Significant interval increase in dense, right perihilar and paramedian radiation fibrosis, with near-total resolution of previously seen ground-glass opacity throughout the right lung. An underlying previously PET avid nodule of the posterior right upper lobe cannot be discretely appreciated (in the vicinity of series 2, image 52). Unchanged masslike, somewhat spiculated opacity of the peripheral left upper lobe measuring 1.9 x 1.6 cm (series 2, image 73). Occasional small, stable pulmonary nodules, for example a 3 mm nodule of the peripheral right upper lobe (series 2, image 60). Trace, loculated right pleural effusion.  Upper Abdomen: No acute abnormality.  Musculoskeletal: No chest wall mass or suspicious bone lesions identified.  IMPRESSION: 1. Significant interval increase in dense, right perihilar and paramedian radiation fibrosis, with near-total resolution of previously seen ground-glass opacity throughout the right lung. An underlying previously PET avid nodule of the posterior right upper lobe cannot be discretely appreciated (in the vicinity of series 2, image 52). 2. Unchanged masslike, somewhat spiculated opacity of the peripheral left upper lobe measuring 1.9 x 1.6 cm (series 2, image 73), without FDG avidity on prior PET examination. Occasional small, stable pulmonary nodules, for example a 3 mm nodule of the peripheral right upper lobe (series 2, image 60).  Attention on follow-up. 3.  Emphysema (ICD10-J43.9). 4. Cardiomegaly and coronary artery disease. 5. Enlargement of the main pulmonary artery up to 3.6 cm, as can be seen in pulmonary hypertension. 6. Aortic Atherosclerosis (ICD10-I70.0).  Electronically Signed   By: Eddie Candle M.D.   On: 10/09/2019 13:38  Complexity Note: Imaging results reviewed. Results shared with Ms. Reddy, using Layman's terms.                               Meds   Current Outpatient Medications:  .  albuterol (VENTOLIN HFA) 108 (90 Base) MCG/ACT inhaler, Inhale 2 puffs into the lungs every 6 (six) hours as needed for wheezing or shortness of breath., Disp: 18 g, Rfl: 0 .  amLODipine (NORVASC) 10 MG tablet, Take 10 mg by mouth daily., Disp: , Rfl:  .  BREO ELLIPTA 200-25 MCG/INH AEPB, Inhale 1 puff into the lungs daily. (Patient taking differently: Inhale 1 puff into the lungs every morning. ), Disp: 60 each, Rfl: 0 .  chlorthalidone (HYGROTON) 25 MG tablet, Take 25 mg by mouth daily., Disp: , Rfl:  .  cloNIDine (CATAPRES) 0.3 MG tablet, Take 0.3 mg by mouth 2 (two) times daily., Disp: , Rfl:  .  cyclobenzaprine (FLEXERIL) 10 MG tablet, Take 10 mg by mouth at bedtime., Disp: , Rfl:  .  DULoxetine (CYMBALTA) 30 MG capsule, Take 30 mg by mouth daily., Disp: , Rfl:  .  hydrALAZINE (APRESOLINE) 100 MG tablet, Take 1 tablet (100 mg total) by mouth 3 (three) times daily., Disp: 90 tablet, Rfl: 0 .  [START ON 02/24/2020] HYDROcodone-acetaminophen (NORCO) 7.5-325 MG tablet, Take 1 tablet by mouth every 8 (eight) hours as needed for severe pain. Must last 30 days., Disp: 90 tablet, Rfl: 0 .  [START ON 03/25/2020] HYDROcodone-acetaminophen (NORCO) 7.5-325 MG tablet, Take 1 tablet by mouth every 8 (eight) hours as needed for severe pain. Must last 30 days., Disp: 90 tablet, Rfl: 0 .  hydrOXYzine (ATARAX/VISTARIL) 10 MG tablet, Take 1 tablet (10 mg total) by mouth 3 (three) times daily as needed for itching., Disp: 30 tablet, Rfl:  0 .  loperamide (IMODIUM) 2 MG capsule, Take 4 mg by mouth 3 (three) times daily., Disp: , Rfl:  .  lovastatin (MEVACOR) 40 MG tablet, Take 1 tablet (40 mg total) by mouth at bedtime., Disp: 30 tablet, Rfl: 0 .  meclizine (ANTIVERT) 12.5 MG tablet, Take 1 tablet (12.5 mg total) by mouth 3 (three) times daily as needed for dizziness., Disp: 30 tablet, Rfl: 0 .  megestrol (MEGACE) 20 MG tablet, Take 1 tablet (20 mg total) by mouth daily., Disp: 30 tablet, Rfl: 0 .  metoprolol succinate (TOPROL-XL) 100 MG 24 hr tablet, Take 100 mg by mouth daily., Disp: , Rfl:  .  mirtazapine (REMERON SOL-TAB) 15 MG disintegrating tablet, Take 15 mg by mouth at bedtime., Disp: , Rfl:  .  mirtazapine (REMERON) 15 MG tablet, Take 15 mg by mouth at bedtime., Disp: , Rfl:  .  montelukast (SINGULAIR) 10 MG tablet, Take 1 tablet (10 mg total) by mouth at bedtime as needed., Disp: 30 tablet, Rfl: 0 .  omeprazole (PRILOSEC) 40 MG capsule, Take 1 capsule (40 mg total) by mouth 2 (two) times daily., Disp: 60 capsule, Rfl: 0 .  ondansetron (ZOFRAN) 8 MG tablet, Take 1 tablet (8 mg total) by mouth every 8 (eight) hours as needed for nausea. Start on day 3 after carboplatin chemo., Disp: 30 tablet, Rfl: 0 .  potassium chloride SA (K-DUR) 20 MEQ tablet, Take 1 tablet (20 mEq total) by mouth 2 (two) times daily. (Patient taking differently: Take 20 mEq by mouth daily. ), Disp: 28 tablet, Rfl: 0 .  terazosin (HYTRIN) 1 MG capsule, Take 1 capsule (1 mg total) by mouth at bedtime., Disp: 30 capsule, Rfl: 0 .  traZODone (DESYREL) 50 MG tablet, Take 50 mg by mouth at bedtime., Disp: , Rfl:  .  metroNIDAZOLE (FLAGYL) 500 MG tablet, Take 500 mg by mouth 2 (two) times daily., Disp: , Rfl:  No current facility-administered medications for this visit.  Facility-Administered Medications Ordered in Other Visits:  .  heparin lock flush 100 unit/mL, 500 Units, Intravenous, Once, Sindy Guadeloupe, MD .  heparin lock flush 100 unit/mL, 500 Units,  Intravenous, Once, Sindy Guadeloupe, MD .  sodium chloride flush (NS) 0.9 % injection 10 mL, 10 mL, Intravenous, PRN, Verlon Au, NP, 10 mL at 04/13/19 1430 .  sodium chloride flush (NS) 0.9 % injection 10 mL, 10 mL, Intravenous, Once, Sindy Guadeloupe, MD .  sodium chloride flush (NS) 0.9 % injection 10 mL, 10 mL, Intravenous, Once, Sindy Guadeloupe, MD .  sodium chloride flush (NS) 0.9 % injection 10 mL, 10 mL, Intravenous, Once, Sindy Guadeloupe, MD  ROS  Constitutional: Denies any fever or chills Gastrointestinal: No reported hemesis, hematochezia, vomiting, or acute GI distress Musculoskeletal: Denies any acute onset joint swelling, redness, loss of ROM, or weakness Neurological: No reported episodes of acute onset apraxia, aphasia, dysarthria, agnosia, amnesia, paralysis, loss of coordination, or loss of consciousness  Allergies  Ms. Frontera is allergic to ace inhibitors; angiotensin receptor blockers; sulfa antibiotics; chlorthalidone; and flexeril [cyclobenzaprine].  PFSH  Drug: Ms. Gamm  reports current drug use. Drug: Marijuana. Alcohol:  reports no history of alcohol use. Tobacco:  reports that she quit smoking about 11 years ago. Her smoking use included cigarettes. She has a 15.00 pack-year smoking history. She has never used smokeless tobacco. Medical:  has a past medical history of Anxiety, Arthritis, Asthma, Cervical central spinal stenosis (C3-C7) (worse at C4-5) (07/30/2017), Cervical foraminal stenosis (C4-5 and C5-6) (Bilateral) (L>R) (07/30/2017), Chronic lower extremity pain (Fourth Area of Pain) (Bilateral) (R>L) (06/11/2017), Chronic neck pain (Primary Area of Pain) (Bilateral) (R>L) (06/11/2017), Chronic sacroiliac joint pain (Bilateral) (R>L) (06/11/2017), Chronic shoulder pain (Tertiary Area of Pain) (Right) (06/11/2017), Chronic upper extremity pain (Fifth Area of Pain) (Bilateral) (R>L) (06/11/2017), Colon cancer ([place]) (11/2018), DDD (degenerative disc disease), cervical  (07/30/2017), DDD (degenerative disc disease), lumbar (07/30/2017), Depression, DISH (diffuse idiopathic skeletal hyperostosis) (07/30/2017), Dyspnea, Entrapment syndrome (06/20/2017), Full thickness rotator cuff tear (06/12/2017), GERD (gastroesophageal reflux disease), Grade 1 Anterolisthesis of L4 over L5 (07/30/2017), Headache, UTI (urinary tract infection), Hypertension, Inflammation of joint of shoulder region (01/15/2017), Lumbar central spinal stenosis (L4-5) (07/30/2017), Lumbar disc protrusion (Left: L5-S1) (Right: L1-2) (07/30/2017), Lumbar facet arthropathy (Bilateral) (07/30/2017), Lumbar facet syndrome (Bilateral) (R>L) (07/30/2017), Lung cancer ([place]) (11/2018), and Osteoarthritis of shoulder (Right) (01/15/2017). Surgical: Ms. Dalpe  has a past surgical history that includes Abdominal hysterectomy; Appendectomy; Breast reduction surgery (Bilateral); Dilation and curettage of uterus; Shoulder arthroscopy with open rotator cuff repair and distal clavicle acrominectomy (Right, 03/26/2017); Reduction mammaplasty; Colonoscopy with propofol (N/A, 11/05/2018); Esophagogastroduodenoscopy (egd) with propofol (N/A, 11/05/2018); Colonoscopy with propofol (N/A, 11/06/2018); ORIF ankle fracture (Right, 11/21/2018); Laparoscopic sigmoid colectomy (N/A, 11/27/2018); Ileostomy (Right, 11/27/2018); Colostomy; Portacath placement (N/A, 03/12/2019); Esophagogastroduodenoscopy (egd) with propofol (N/A, 03/27/2019); Colonoscopy with propofol (N/A, 07/15/2019); Ileostomy closure (N/A, 08/18/2019);  and Port-a-cath removal (Right, 08/18/2019). Family: family history includes Breast cancer (age of onset: 47) in her mother; Breast cancer (age of onset: 47) in her sister; Hypertension in her mother.  Constitutional Exam  General appearance: Well nourished, well developed, and well hydrated. In no apparent acute distress Vitals:   02/16/20 1447  BP: (!) 112/53  Pulse: 62  Resp: 18  Temp: (!) 97.5 F (36.4 C)  SpO2: 100%  Weight:  162 lb (73.5 kg)  Height: '5\' 5"'$  (1.651 m)   BMI Assessment: Estimated body mass index is 26.96 kg/m as calculated from the following:   Height as of this encounter: '5\' 5"'$  (1.651 m).   Weight as of this encounter: 162 lb (73.5 kg).  BMI interpretation table: BMI level Category Range association with higher incidence of chronic pain  <18 kg/m2 Underweight   18.5-24.9 kg/m2 Ideal body weight   25-29.9 kg/m2 Overweight Increased incidence by 20%  30-34.9 kg/m2 Obese (Class I) Increased incidence by 68%  35-39.9 kg/m2 Severe obesity (Class II) Increased incidence by 136%  >40 kg/m2 Extreme obesity (Class III) Increased incidence by 254%   Patient's current BMI Ideal Body weight  Body mass index is 26.96 kg/m. Ideal body weight: 57 kg (125 lb 10.6 oz) Adjusted ideal body weight: 63.6 kg (140 lb 3.2 oz)   BMI Readings from Last 4 Encounters:  02/16/20 26.96 kg/m  01/29/20 25.74 kg/m  12/08/19 25.46 kg/m  11/19/19 24.79 kg/m   Wt Readings from Last 4 Encounters:  02/16/20 162 lb (73.5 kg)  01/29/20 154 lb 11.2 oz (70.2 kg)  12/08/19 153 lb (69.4 kg)  11/19/19 149 lb (67.6 kg)    Psych/Mental status: Alert, oriented x 3 (person, place, & time)       Eyes: PERLA Respiratory: No evidence of acute respiratory distress  Cervical Spine Exam  Skin & Axial Inspection: No masses, redness, edema, swelling, or associated skin lesions Alignment: Symmetrical Functional ROM: Unrestricted ROM      Stability: No instability detected Muscle Tone/Strength: Functionally intact. No obvious neuro-muscular anomalies detected. Sensory (Neurological): Unimpaired Palpation: No palpable anomalies              Upper Extremity (UE) Exam    Side: Right upper extremity  Side: Left upper extremity  Skin & Extremity Inspection: Skin color, temperature, and hair growth are WNL. No peripheral edema or cyanosis. No masses, redness, swelling, asymmetry, or associated skin lesions. No contractures.  Skin &  Extremity Inspection: Skin color, temperature, and hair growth are WNL. No peripheral edema or cyanosis. No masses, redness, swelling, asymmetry, or associated skin lesions. No contractures.  Functional ROM: Unrestricted ROM          Functional ROM: Unrestricted ROM          Muscle Tone/Strength: Functionally intact. No obvious neuro-muscular anomalies detected.  Muscle Tone/Strength: Functionally intact. No obvious neuro-muscular anomalies detected.  Sensory (Neurological): Unimpaired          Sensory (Neurological): Unimpaired          Palpation: No palpable anomalies              Palpation: No palpable anomalies              Provocative Test(s):  Phalen's test: deferred Tinel's test: deferred Apley's scratch test (touch opposite shoulder):  Action 1 (Across chest): deferred Action 2 (Overhead): deferred Action 3 (LB reach): deferred   Provocative Test(s):  Phalen's test: deferred Tinel's test: deferred Apley's scratch test (touch opposite  shoulder):  Action 1 (Across chest): deferred Action 2 (Overhead): deferred Action 3 (LB reach): deferred    Thoracic Spine Area Exam  Skin & Axial Inspection: No masses, redness, or swelling Alignment: Symmetrical Functional ROM: Decreased ROM Stability: No instability detected Muscle Tone/Strength: Functionally intact. No obvious neuro-muscular anomalies detected. Sensory (Neurological): Unimpaired Muscle strength & Tone: No palpable anomalies  Lumbar Exam  Skin & Axial Inspection: No masses, redness, or swelling Alignment: Symmetrical Functional ROM: Unrestricted ROM       Stability: No instability detected Muscle Tone/Strength: Functionally intact. No obvious neuro-muscular anomalies detected. Sensory (Neurological): Dermatomal pain pattern  Gait & Posture Assessment  Ambulation: Patient came in today in a wheel chair Gait: Limited. Using assistive device to ambulate Posture: Difficulty standing up straight, due to pain   Lower  Extremity Exam    Side: Right lower extremity  Side: Left lower extremity  Stability: No instability observed          Stability: No instability observed          Skin & Extremity Inspection: Skin color, temperature, and hair growth are WNL. No peripheral edema or cyanosis. No masses, redness, swelling, asymmetry, or associated skin lesions. No contractures.  Skin & Extremity Inspection: Skin color, temperature, and hair growth are WNL. No peripheral edema or cyanosis. No masses, redness, swelling, asymmetry, or associated skin lesions. No contractures.  Functional ROM: Decreased ROM for hip and knee joints          Functional ROM: Decreased ROM for hip and knee joints          Muscle Tone/Strength: Functionally intact. No obvious neuro-muscular anomalies detected.  Muscle Tone/Strength: Functionally intact. No obvious neuro-muscular anomalies detected.  Sensory (Neurological): Arthropathic arthralgia        Sensory (Neurological): Dermatomal pain pattern        DTR: Patellar: deferred today Achilles: deferred today Plantar: deferred today  DTR: Patellar: deferred today Achilles: deferred today Plantar: deferred today  Palpation: No palpable anomalies  Palpation: No palpable anomalies   Assessment   Status Diagnosis  Controlled Controlled Controlled 1. Chronic pain syndrome   2. Lumbar central spinal stenosis (L4-5)   3. Ankylosing spondylitis of multiple sites in spine [facility])      Updated Problems: Problem  Lumbar central spinal stenosis (L4-5)    Plan of Care  Pharmacotherapy (Medications Ordered): Meds ordered this encounter  Medications  . HYDROcodone-acetaminophen (NORCO) 7.5-325 MG tablet    Sig: Take 1 tablet by mouth every 8 (eight) hours as needed for severe pain. Must last 30 days.    Dispense:  90 tablet    Refill:  0    Chronic Pain. (STOP Act - Not applicable). Fill one day early if closed on scheduled refill date.  Marland Kitchen HYDROcodone-acetaminophen (NORCO) 7.5-325 MG  tablet    Sig: Take 1 tablet by mouth every 8 (eight) hours as needed for severe pain. Must last 30 days.    Dispense:  90 tablet    Refill:  0    Chronic Pain. (STOP Act - Not applicable). Fill one day early if closed on scheduled refill date.    Planned follow-up:   Return in about 9 weeks (around 04/19/2020) for Medication Management, in person.   Recent Visits Date Type Provider Dept  12/24/19 Office Visit Gloria Santa, MD [facility]  11/19/19 Office Visit Gloria Santa, MD [facility]  Showing recent visits within past 90 days and meeting all other requirements   Today's  Visits Date Type Provider Dept  02/16/20 Office Visit Gloria Santa, MD [facility]  Showing today's visits and meeting all other requirements   Future Appointments Date Type Provider Dept  04/19/20 Appointment Gloria Santa, MD [facility]  Showing future appointments within next 90 days and meeting all other requirements   Primary Care Physician: Gloria Morgan, MD Location: [facility] Note by: Gloria Santa, MD Date: 02/16/2020; Time: 3:40 PM  Note: This dictation was prepared with Dragon dictation. Any transcriptional errors that may result from this process are unintentional.

## 2020-02-25 ENCOUNTER — Ambulatory Visit
Admission: RE | Admit: 2020-02-25 | Discharge: 2020-02-25 | Disposition: A | Discharge status: Home / Self Care | Payer: Medicare Other | Source: Ambulatory Visit | Attending: Oncology | Admitting: Oncology

## 2020-02-25 ENCOUNTER — Other Ambulatory Visit: Payer: Self-pay

## 2020-02-25 DIAGNOSIS — Z08 Encounter for follow-up examination after completed treatment for malignant neoplasm: Secondary | ICD-10-CM | POA: Diagnosis not present

## 2020-02-25 DIAGNOSIS — Z85038 Personal history of other malignant neoplasm of large intestine: Secondary | ICD-10-CM | POA: Diagnosis present

## 2020-02-25 LAB — POCT I-STAT CREATININE: Creatinine, Ser: 0.7 mg/dL (ref 0.44–1.00)

## 2020-02-25 MED ORDER — IOHEXOL 300 MG/ML  SOLN
100.0000 mL | Freq: Once | INTRAMUSCULAR | Status: AC | PRN
  Administered 2020-02-25: 100 mL via INTRAVENOUS

## 2020-02-29 ENCOUNTER — Other Ambulatory Visit: Payer: Self-pay | Admitting: *Deleted

## 2020-02-29 DIAGNOSIS — Z08 Encounter for follow-up examination after completed treatment for malignant neoplasm: Secondary | ICD-10-CM

## 2020-02-29 DIAGNOSIS — C187 Malignant neoplasm of sigmoid colon: Secondary | ICD-10-CM

## 2020-03-03 ENCOUNTER — Encounter: Payer: Self-pay | Admitting: Oncology

## 2020-03-03 ENCOUNTER — Inpatient Hospital Stay: Payer: Medicare Other

## 2020-03-03 ENCOUNTER — Other Ambulatory Visit: Payer: Self-pay

## 2020-03-03 ENCOUNTER — Inpatient Hospital Stay: Payer: Medicare Other | Attending: Oncology

## 2020-03-03 ENCOUNTER — Inpatient Hospital Stay (HOSPITAL_BASED_OUTPATIENT_CLINIC_OR_DEPARTMENT_OTHER): Payer: Medicare Other | Admitting: Oncology

## 2020-03-03 VITALS — BP 105/59 | HR 64 | Temp 98.2°F | Resp 16 | Wt 164.2 lb

## 2020-03-03 DIAGNOSIS — Z85038 Personal history of other malignant neoplasm of large intestine: Secondary | ICD-10-CM | POA: Insufficient documentation

## 2020-03-03 DIAGNOSIS — Z79899 Other long term (current) drug therapy: Secondary | ICD-10-CM | POA: Diagnosis not present

## 2020-03-03 DIAGNOSIS — G8929 Other chronic pain: Secondary | ICD-10-CM | POA: Insufficient documentation

## 2020-03-03 DIAGNOSIS — C3490 Malignant neoplasm of unspecified part of unspecified bronchus or lung: Secondary | ICD-10-CM | POA: Diagnosis not present

## 2020-03-03 DIAGNOSIS — C187 Malignant neoplasm of sigmoid colon: Secondary | ICD-10-CM

## 2020-03-03 DIAGNOSIS — Z85118 Personal history of other malignant neoplasm of bronchus and lung: Secondary | ICD-10-CM | POA: Diagnosis not present

## 2020-03-03 DIAGNOSIS — R935 Abnormal findings on diagnostic imaging of other abdominal regions, including retroperitoneum: Secondary | ICD-10-CM

## 2020-03-03 DIAGNOSIS — Z08 Encounter for follow-up examination after completed treatment for malignant neoplasm: Secondary | ICD-10-CM | POA: Diagnosis not present

## 2020-03-03 DIAGNOSIS — Z87891 Personal history of nicotine dependence: Secondary | ICD-10-CM | POA: Insufficient documentation

## 2020-03-03 DIAGNOSIS — I10 Essential (primary) hypertension: Secondary | ICD-10-CM | POA: Diagnosis not present

## 2020-03-03 DIAGNOSIS — M545 Low back pain: Secondary | ICD-10-CM | POA: Insufficient documentation

## 2020-03-03 DIAGNOSIS — K219 Gastro-esophageal reflux disease without esophagitis: Secondary | ICD-10-CM | POA: Insufficient documentation

## 2020-03-03 LAB — CBC WITH DIFFERENTIAL/PLATELET
Abs Immature Granulocytes: 0.05 10*3/uL (ref 0.00–0.07)
Basophils Absolute: 0.1 10*3/uL (ref 0.0–0.1)
Basophils Relative: 1 %
Eosinophils Absolute: 0.4 10*3/uL (ref 0.0–0.5)
Eosinophils Relative: 5 %
HCT: 33.4 % — ABNORMAL LOW (ref 36.0–46.0)
Hemoglobin: 11 g/dL — ABNORMAL LOW (ref 12.0–15.0)
Immature Granulocytes: 1 %
Lymphocytes Relative: 15 %
Lymphs Abs: 1.2 10*3/uL (ref 0.7–4.0)
MCH: 28.5 pg (ref 26.0–34.0)
MCHC: 32.9 g/dL (ref 30.0–36.0)
MCV: 86.5 fL (ref 80.0–100.0)
Monocytes Absolute: 0.6 10*3/uL (ref 0.1–1.0)
Monocytes Relative: 7 %
Neutro Abs: 6.1 10*3/uL (ref 1.7–7.7)
Neutrophils Relative %: 71 %
Platelets: 252 10*3/uL (ref 150–400)
RBC: 3.86 MIL/uL — ABNORMAL LOW (ref 3.87–5.11)
RDW: 14.8 % (ref 11.5–15.5)
WBC: 8.5 10*3/uL (ref 4.0–10.5)
nRBC: 0 % (ref 0.0–0.2)

## 2020-03-03 LAB — COMPREHENSIVE METABOLIC PANEL
ALT: 19 U/L (ref 0–44)
AST: 20 U/L (ref 15–41)
Albumin: 3.7 g/dL (ref 3.5–5.0)
Alkaline Phosphatase: 98 U/L (ref 38–126)
Anion gap: 10 (ref 5–15)
BUN: 13 mg/dL (ref 8–23)
CO2: 29 mmol/L (ref 22–32)
Calcium: 9 mg/dL (ref 8.9–10.3)
Chloride: 102 mmol/L (ref 98–111)
Creatinine, Ser: 0.73 mg/dL (ref 0.44–1.00)
GFR calc Af Amer: >60 mL/min (ref >60)
GFR calc non Af Amer: >60 mL/min (ref >60)
Glucose, Bld: 172 mg/dL — ABNORMAL HIGH (ref 70–99)
Potassium: 4 mmol/L (ref 3.5–5.1)
Sodium: 141 mmol/L (ref 135–145)
Total Bilirubin: 0.6 mg/dL (ref 0.3–1.2)
Total Protein: 7.1 g/dL (ref 6.5–8.1)

## 2020-03-03 NOTE — Progress Notes (Signed)
Patient here for oncology follow-up appointment, expresses concerns of dark lump on chest, vaginal odor, and urinary frequency. Back pain.

## 2020-03-04 LAB — CEA: CEA: 2.6 ng/mL (ref 0.0–4.7)

## 2020-03-05 NOTE — Progress Notes (Signed)
Hematology/Oncology Consult note [facility]  [phone] [phone]  Patient Care Team: [name], MD as PCP - General (Family Medicine)   Name of the patient: Gloria Allen  [id number]  07-Apr-1946   Date of visit: 03/05/20  Diagnosis- 1.Stage III colon cancer status post surgery 2.Limited stage small cell lung cancer  Chief complaint/ Reason for visit-follow-up of lung and colon cancer.  Discuss CT scan results  Heme/Onc history: patient is a 74 year old African-American female who recently underwent screening colonoscopy by Dr. Bonna Gains.Colonoscopy showed villous nonobstructing large mass in the sigmoid colon 12 cm proximal to the anus. Mass was partially circumferential. Biopsy showed invasive adenocarcinoma. Patient was seen by Dr. Gretchen Short underwent hemicolectomy with lymph node sampling on 11/27/2018. Final pathology showed invasive colorectal carcinoma 3.5 cm, grade 2. Tumor invades through muscularis propria into the peri-colorectal tissue. Margins negative. Tumor deposits present, 2. 1 out of 9 lymph nodes examined was positive for malignancy. [id number].Patient was feeling poorly after surgery and she had no social support and decided against adjuvant chemotherapy.  Patient underwent CT chest to complete her staging work-up which showed 2 lung nodules one in the right upper lobe and one in the left left upper lobe.This was followed by a PET CT scan which showed with the left upper lobe nodule was not hypermetabolic.The right upper lobe lung nodule measure 1.6 cm with an SUV of 6.8. No hypermetabolic axillary mediastinal or hilar lymphadenopathy or evidence of metastatic disease elsewhere. Patient underwent CT-guided biopsy of the right upper lobe lung nodule which was consistent with small cell lung cancerpositive for CD56 and TTF-1.Patient started concurrent chemoradiation in June 2020. Carboplatin chosen instead  of cisplatin with etoposide given her age, poor social support and concern for renal dysfunction. Patient completed 4 cycles of carboplatin and etoposide on 05/19/2019   Interval history-patient has ongoing chronic back pain for which she sees pain management.  Otherwise reports feeling well.  She walked into my office today with her sitting in a wheelchair.  Appetite and weight have remained stable.  ECOG PS- 1 Pain scale- 8- chronic back pain   Review of systems- Review of Systems  Constitutional: Positive for malaise/fatigue. Negative for chills, fever and weight loss.  HENT: Negative for congestion, ear discharge and nosebleeds.   Eyes: Negative for blurred vision.  Respiratory: Negative for cough, hemoptysis, sputum production, shortness of breath and wheezing.   Cardiovascular: Negative for chest pain, palpitations, orthopnea and claudication.  Gastrointestinal: Negative for abdominal pain, blood in stool, constipation, diarrhea, heartburn, melena, nausea and vomiting.  Genitourinary: Negative for dysuria, flank pain, frequency, hematuria and urgency.  Musculoskeletal: Positive for back pain. Negative for joint pain and myalgias.  Skin: Negative for rash.  Neurological: Negative for dizziness, tingling, focal weakness, seizures, weakness and headaches.  Endo/Heme/Allergies: Does not bruise/bleed easily.  Psychiatric/Behavioral: Negative for depression and suicidal ideas. The patient does not have insomnia.       Allergies  Allergen Reactions  . Ace Inhibitors Swelling  . Angiotensin Receptor Blockers Swelling  . Sulfa Antibiotics Itching  . Chlorthalidone Rash  . Flexeril [Cyclobenzaprine] Rash     Past Medical History:  Diagnosis Date  . Anxiety   . Arthritis   . Asthma   . Cervical central spinal stenosis (C3-C7) (worse at C4-5) 07/30/2017  . Cervical foraminal stenosis (C4-5 and C5-6) (Bilateral) (L>R) 07/30/2017  . Chronic lower extremity pain (Fourth Area of  Pain) (Bilateral) (R>L) 06/11/2017  . Chronic neck pain (  Primary Area of Pain) (Bilateral) (R>L) 06/11/2017  . Chronic sacroiliac joint pain (Bilateral) (R>L) 06/11/2017  . Chronic shoulder pain [facility] Area of Pain) (Right) 06/11/2017  . Chronic upper extremity pain (Fifth Area of Pain) (Bilateral) (R>L) 06/11/2017  . Colon cancer ([place]) 11/2018   Partial colon resection  . DDD (degenerative disc disease), cervical 07/30/2017  . DDD (degenerative disc disease), lumbar 07/30/2017  . Depression   . DISH (diffuse idiopathic skeletal hyperostosis) 07/30/2017  . Dyspnea    with exertion  . Entrapment syndrome 06/20/2017   2002 on R and 2010 on L  . Full thickness rotator cuff tear 06/12/2017  . GERD (gastroesophageal reflux disease)   . Grade 1 Anterolisthesis of L4 over L5 07/30/2017  . Headache   . Hx: UTI (urinary tract infection)   . Hypertension   . Inflammation of joint of shoulder region 01/15/2017  . Lumbar central spinal stenosis (L4-5) 07/30/2017  . Lumbar disc protrusion (Left: L5-S1) (Right: L1-2) 07/30/2017   L5-S1 left foraminal protrusion with L5 impingement. L1-2 right paracentral protrusion without impingement.  . Lumbar facet arthropathy (Bilateral) 07/30/2017  . Lumbar facet syndrome (Bilateral) (R>L) 07/30/2017  . Lung cancer (Gloria Allen) 11/2018   Chemo  and rad tx's  . Osteoarthritis of shoulder (Right) 01/15/2017     Past Surgical History:  Procedure Laterality Date  . ABDOMINAL HYSTERECTOMY    . APPENDECTOMY    . BREAST REDUCTION SURGERY Bilateral   . COLONOSCOPY WITH PROPOFOL N/A 11/05/2018   Procedure: COLONOSCOPY WITH PROPOFOL;  Surgeon: Virgel Manifold, MD;  Location: [facility];  Service: Endoscopy;  Laterality: N/A;  . COLONOSCOPY WITH PROPOFOL N/A 11/06/2018   Procedure: COLONOSCOPY WITH PROPOFOL;  Surgeon: Virgel Manifold, MD;  Location: [facility];  Service: Endoscopy;  Laterality: N/A;  . COLONOSCOPY WITH PROPOFOL N/A 07/15/2019   Procedure:  COLONOSCOPY WITH PROPOFOL;  Surgeon: Virgel Manifold, MD;  Location: [facility];  Service: Endoscopy;  Laterality: N/A;  . COLOSTOMY    . DILATION AND CURETTAGE OF UTERUS    . ESOPHAGOGASTRODUODENOSCOPY (EGD) WITH PROPOFOL N/A 11/05/2018   Procedure: ESOPHAGOGASTRODUODENOSCOPY (EGD) WITH PROPOFOL;  Surgeon: Virgel Manifold, MD;  Location: [facility];  Service: Endoscopy;  Laterality: N/A;  . ESOPHAGOGASTRODUODENOSCOPY (EGD) WITH PROPOFOL N/A 03/27/2019   Procedure: ESOPHAGOGASTRODUODENOSCOPY (EGD) WITH PROPOFOL;  Surgeon: Lucilla Lame, MD;  Location: [facility];  Service: Endoscopy;  Laterality: N/A;  . ILEOSTOMY Right 11/27/2018   Procedure: ILEOSTOMY;  Surgeon: Jules Husbands, MD;  Location: [facility];  Service: General;  Laterality: Right;  . ILEOSTOMY CLOSURE N/A 08/18/2019   Procedure: ILEOSTOMY TAKEDOWN;  Surgeon: Jules Husbands, MD;  Location: [facility];  Service: General;  Laterality: N/A;  . LAPAROSCOPIC SIGMOID COLECTOMY N/A 11/27/2018   Procedure: LAPAROSCOPIC SIGMOID COLECTOMY;  Surgeon: Jules Husbands, MD;  Location: [facility];  Service: General;  Laterality: N/A;  . ORIF ANKLE FRACTURE Right 11/21/2018   Procedure: OPEN REDUCTION INTERNAL FIXATION (ORIF) ANKLE FRACTURE;  Surgeon: Corky Mull, MD;  Location: [facility];  Service: Orthopedics;  Laterality: Right;  . PORT-A-CATH REMOVAL Right 08/18/2019   Procedure: REMOVAL PORT-A-CATH;  Surgeon: Jules Husbands, MD;  Location: [facility];  Service: General;  Laterality: Right;  . PORTACATH PLACEMENT N/A 03/12/2019   Procedure: INSERTION PORT-A-CATH;  Surgeon: Jules Husbands, MD;  Location: [facility];  Service: General;  Laterality: N/A;  . REDUCTION MAMMAPLASTY    . SHOULDER ARTHROSCOPY WITH OPEN ROTATOR CUFF REPAIR AND DISTAL CLAVICLE ACROMINECTOMY Right 03/26/2017  Procedure: right shoulder arthroscopy, arthroscopic subacromial decompression, distal clavicle excision, mini open rotator cuff repair;  Surgeon: Thornton Park, MD;  Location: [facility];  Service: Orthopedics;  Laterality: Right;    Social History   Socioeconomic History  . Marital status: Divorced    Spouse name: Not on file  . Number of children: 3  . Years of education: Not on file  . Highest education level: 11th grade  Occupational History  . Not on file  Tobacco Use  . Smoking status: Former Smoker    Packs/day: 1.00    Years: 15.00    Pack years: 15.00    Types: Cigarettes    Quit date: 2010    Years since quitting: 11.4  . Smokeless tobacco: Never Used  . Tobacco comment: quit over 30 years ago   Substance and Sexual Activity  . Alcohol use: No  . Drug use: Yes    Types: Marijuana    Comment: occ  . Sexual activity: Not Currently  Other Topics Concern  . 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 She receives disability. She has 3 children and several grandchildren. She lives alone. She drives. Uses a walker and shower chair at baseline.    Social Determinants of Health   Financial Resource Strain:   . Difficulty of Paying Living Expenses:   Food Insecurity:   . Worried About Charity fundraiser in the Last Year:   . Arboriculturist in the Last Year:   Transportation Needs:   . Film/video editor (Medical):   Marland Kitchen Lack of Transportation (Non-Medical):   Physical Activity:   . Days of Exercise per Week:   . Minutes of Exercise per Session:   Stress:   . Feeling of Stress :   Social Connections:   . Frequency of Communication with Friends and Family:   . Frequency of Social Gatherings with Friends and Family:   . Attends Religious Services:   . Active Member of Clubs or Organizations:   . Attends Archivist Meetings:   Marland Kitchen Marital Status:   Intimate Partner Violence:   . Fear of Current or Ex-Partner:   . Emotionally Abused:   Marland Kitchen Physically Abused:   . Sexually Abused:     Family History  Problem Relation Age of Onset  . Hypertension Mother   .  Breast cancer Mother 41  . Breast cancer Sister 47     Current Outpatient Medications:  .  amLODipine (NORVASC) 10 MG tablet, Take 10 mg by mouth daily., Disp: , Rfl:  .  cloNIDine (CATAPRES) 0.3 MG tablet, Take 0.3 mg by mouth 2 (two) times daily., Disp: , Rfl:  .  DULoxetine (CYMBALTA) 30 MG capsule, Take 30 mg by mouth daily., Disp: , Rfl:  .  hydrALAZINE (APRESOLINE) 100 MG tablet, Take 1 tablet (100 mg total) by mouth 3 (three) times daily., Disp: 90 tablet, Rfl: 0 .  HYDROcodone-acetaminophen (NORCO) 7.5-325 MG tablet, Take 1 tablet by mouth every 8 (eight) hours as needed for severe pain. Must last 30 days., Disp: 90 tablet, Rfl: 0 .  [START ON 03/25/2020] HYDROcodone-acetaminophen (NORCO) 7.5-325 MG tablet, Take 1 tablet by mouth every 8 (eight) hours as needed for severe pain. Must last 30 days., Disp: 90 tablet, Rfl: 0 .  hydrOXYzine (ATARAX/VISTARIL) 10 MG tablet, Take 1 tablet (10 mg total) by mouth 3 (three) times daily as needed for itching., Disp: 30  tablet, Rfl: 0 .  meclizine (ANTIVERT) 12.5 MG tablet, Take 1 tablet (12.5 mg total) by mouth 3 (three) times daily as needed for dizziness., Disp: 30 tablet, Rfl: 0 .  metoprolol succinate (TOPROL-XL) 100 MG 24 hr tablet, Take 100 mg by mouth daily., Disp: , Rfl:  .  mirtazapine (REMERON SOL-TAB) 15 MG disintegrating tablet, Take 15 mg by mouth at bedtime., Disp: , Rfl:  .  mirtazapine (REMERON) 15 MG tablet, Take 15 mg by mouth at bedtime., Disp: , Rfl:  .  montelukast (SINGULAIR) 10 MG tablet, Take 1 tablet (10 mg total) by mouth at bedtime as needed., Disp: 30 tablet, Rfl: 0 .  potassium chloride SA (K-DUR) 20 MEQ tablet, Take 1 tablet (20 mEq total) by mouth 2 (two) times daily. (Patient taking differently: Take 20 mEq by mouth daily. ), Disp: 28 tablet, Rfl: 0 .  traZODone (DESYREL) 50 MG tablet, Take 50 mg by mouth at bedtime., Disp: , Rfl:  .  albuterol (VENTOLIN HFA) 108 (90 Base) MCG/ACT inhaler, Inhale 2 puffs into the lungs  every 6 (six) hours as needed for wheezing or shortness of breath. (Patient not taking: Reported on 03/03/2020), Disp: 18 g, Rfl: 0 .  BREO ELLIPTA 200-25 MCG/INH AEPB, Inhale 1 puff into the lungs daily. (Patient not taking: Reported on 03/03/2020), Disp: 60 each, Rfl: 0 .  chlorthalidone (HYGROTON) 25 MG tablet, Take 25 mg by mouth daily., Disp: , Rfl:  .  cyclobenzaprine (FLEXERIL) 10 MG tablet, Take 10 mg by mouth at bedtime., Disp: , Rfl:  .  loperamide (IMODIUM) 2 MG capsule, Take 4 mg by mouth 3 (three) times daily., Disp: , Rfl:  .  lovastatin (MEVACOR) 40 MG tablet, Take 1 tablet (40 mg total) by mouth at bedtime. (Patient not taking: Reported on 03/03/2020), Disp: 30 tablet, Rfl: 0 .  megestrol (MEGACE) 20 MG tablet, Take 1 tablet (20 mg total) by mouth daily. (Patient not taking: Reported on 03/03/2020), Disp: 30 tablet, Rfl: 0 .  metroNIDAZOLE (FLAGYL) 500 MG tablet, Take 500 mg by mouth 2 (two) times daily., Disp: , Rfl:  .  omeprazole (PRILOSEC) 40 MG capsule, Take 1 capsule (40 mg total) by mouth 2 (two) times daily. (Patient not taking: Reported on 03/03/2020), Disp: 60 capsule, Rfl: 0 .  ondansetron (ZOFRAN) 8 MG tablet, Take 1 tablet (8 mg total) by mouth every 8 (eight) hours as needed for nausea. Start on day 3 after carboplatin chemo. (Patient not taking: Reported on 03/03/2020), Disp: 30 tablet, Rfl: 0 .  terazosin (HYTRIN) 1 MG capsule, Take 1 capsule (1 mg total) by mouth at bedtime. (Patient not taking: Reported on 03/03/2020), Disp: 30 capsule, Rfl: 0 No current facility-administered medications for this visit.  Facility-Administered Medications Ordered in Other Visits:  .  heparin lock flush 100 unit/mL, 500 Units, Intravenous, Once, Sindy Guadeloupe, MD .  heparin lock flush 100 unit/mL, 500 Units, Intravenous, Once, Sindy Guadeloupe, MD .  sodium chloride flush (NS) 0.9 % injection 10 mL, 10 mL, Intravenous, PRN, Verlon Au, NP, 10 mL at 04/13/19 1430 .  sodium chloride flush (NS)  0.9 % injection 10 mL, 10 mL, Intravenous, Once, Sindy Guadeloupe, MD .  sodium chloride flush (NS) 0.9 % injection 10 mL, 10 mL, Intravenous, Once, Sindy Guadeloupe, MD .  sodium chloride flush (NS) 0.9 % injection 10 mL, 10 mL, Intravenous, Once, Sindy Guadeloupe, MD  Physical exam:  Vitals:   03/03/20 1044  BP: Marland Kitchen)  105/59  Pulse: 64  Resp: 16  Temp: 98.2 F (36.8 C)  TempSrc: Oral  SpO2: 98%  Weight: 164 lb 3.2 oz (74.5 kg)   Physical Exam Constitutional:      General: She is not in acute distress. Cardiovascular:     Rate and Rhythm: Normal rate and regular rhythm.     Heart sounds: Normal heart sounds.  Pulmonary:     Effort: Pulmonary effort is normal.     Breath sounds: Normal breath sounds.  Abdominal:     General: Bowel sounds are normal.     Palpations: Abdomen is soft.     Comments: Prior surgical scars seen. No palpable masses  Skin:    General: Skin is warm and dry.  Neurological:     Mental Status: She is alert and oriented to person, place, and time.      CMP Latest Ref Rng & Units 03/03/2020  Glucose 70 - 99 mg/dL 172(H)  BUN 8 - 23 mg/dL 13  Creatinine 0.44 - 1.00 mg/dL 0.73  Sodium 135 - 145 mmol/L 141  Potassium 3.5 - 5.1 mmol/L 4.0  Chloride 98 - 111 mmol/L 102  CO2 22 - 32 mmol/L 29  Calcium 8.9 - 10.3 mg/dL 9.0  Total Protein 6.5 - 8.1 g/dL 7.1  Total Bilirubin 0.3 - 1.2 mg/dL 0.6  Alkaline Phos 38 - 126 U/L 98  AST 15 - 41 U/L 20  ALT 0 - 44 U/L 19   CBC Latest Ref Rng & Units 03/03/2020  WBC 4.0 - 10.5 K/uL 8.5  Hemoglobin 12.0 - 15.0 g/dL 11.0(L)  Hematocrit 36.0 - 46.0 % 33.4(L)  Platelets 150 - 400 K/uL 252    No images are attached to the encounter.  CT Chest W Contrast  Result Date: 02/25/2020 CLINICAL DATA:  Follow-up sigmoid colon carcinoma. EXAM: CT CHEST, ABDOMEN, AND PELVIS WITH CONTRAST TECHNIQUE: Multidetector CT imaging of the chest, abdomen and pelvis was performed following the standard protocol during bolus administration of  intravenous contrast. CONTRAST:  160mL OMNIPAQUE IOHEXOL 300 MG/ML  SOLN COMPARISON:  Chest CT 10/08/2018, and AP CT 09/03/2019 FINDINGS: CT CHEST FINDINGS Cardiovascular: No acute findings. Aortic and coronary artery atherosclerosis noted. Mediastinum/Lymph Nodes: No masses or pathologically enlarged lymph nodes identified. Lungs/Pleura: Stable post radiation changes throughout right paramediastinal lung zone. Three mil peripheral right upper lobe pulmonary nodule image 58/504 is stable. Another 3 mm pulmonary nodule in anterior left upper lobe on image 47/504 is also stable. Spiculated nodule in peripheral left upper lobe measures 2.3 x 1.9 cm on image 104, compared to 2.0 x 0.6 cm previously, suspicious for low-grade adenocarcinoma. No evidence of pleural effusion. Musculoskeletal:  No suspicious bone lesions identified. CT ABDOMEN AND PELVIS FINDINGS Hepatobiliary: No masses identified. Focal fatty infiltration again seen centrally adjacent to the porta hepatis. Stable tiny cyst in inferior right. Prior cholecystectomy. No evidence of biliary obstruction. Pancreas:  No mass or inflammatory changes. Spleen:  Within normal limits in size and appearance. Adrenals/Urinary tract: No masses or hydronephrosis. Stable tiny sub-cm left renal cyst. Stomach/Bowel: Stable postop changes from previous left hemicolectomy. No evidence of obstruction, inflammatory process, or abnormal fluid collections. Vascular/Lymphatic: No pathologically enlarged lymph nodes identified. No abdominal aortic aneurysm. Aortic atherosclerosis noted. Reproductive:  No mass or other significant abnormality identified. Other: Ill-defined peritoneal soft tissue density is seen in anterior right lower quadrant which measures 2.6 x 1.9 cm on image 107/503. This is increased since previous study, and peritoneal carcinomatosis cannot be excluded.  No other masses or ascites visualized. Musculoskeletal:  No suspicious bone lesions identified. IMPRESSION:  1. Gradual in slight increase size of 2.3 cm spiculated nodule in peripheral left upper lobe, suspicious for low-grade adenocarcinoma. 2. New ill-defined peritoneal soft tissue density in anterior right lower quadrant. Peritoneal carcinomatosis cannot be excluded. Consider further evaluation with PET-CT scan, or short-term follow-up by CT in 3 months. Electronically Signed   By: Marlaine Hind M.D.   On: 02/25/2020 11:19   CT ABDOMEN PELVIS W CONTRAST  Result Date: 02/25/2020 CLINICAL DATA:  Follow-up sigmoid colon carcinoma. EXAM: CT CHEST, ABDOMEN, AND PELVIS WITH CONTRAST TECHNIQUE: Multidetector CT imaging of the chest, abdomen and pelvis was performed following the standard protocol during bolus administration of intravenous contrast. CONTRAST:  137mL OMNIPAQUE IOHEXOL 300 MG/ML  SOLN COMPARISON:  Chest CT 10/08/2018, and AP CT 09/03/2019 FINDINGS: CT CHEST FINDINGS Cardiovascular: No acute findings. Aortic and coronary artery atherosclerosis noted. Mediastinum/Lymph Nodes: No masses or pathologically enlarged lymph nodes identified. Lungs/Pleura: Stable post radiation changes throughout right paramediastinal lung zone. Three mil peripheral right upper lobe pulmonary nodule image 58/504 is stable. Another 3 mm pulmonary nodule in anterior left upper lobe on image 47/504 is also stable. Spiculated nodule in peripheral left upper lobe measures 2.3 x 1.9 cm on image 104, compared to 2.0 x 0.6 cm previously, suspicious for low-grade adenocarcinoma. No evidence of pleural effusion. Musculoskeletal:  No suspicious bone lesions identified. CT ABDOMEN AND PELVIS FINDINGS Hepatobiliary: No masses identified. Focal fatty infiltration again seen centrally adjacent to the porta hepatis. Stable tiny cyst in inferior right. Prior cholecystectomy. No evidence of biliary obstruction. Pancreas:  No mass or inflammatory changes. Spleen:  Within normal limits in size and appearance. Adrenals/Urinary tract: No masses or  hydronephrosis. Stable tiny sub-cm left renal cyst. Stomach/Bowel: Stable postop changes from previous left hemicolectomy. No evidence of obstruction, inflammatory process, or abnormal fluid collections. Vascular/Lymphatic: No pathologically enlarged lymph nodes identified. No abdominal aortic aneurysm. Aortic atherosclerosis noted. Reproductive:  No mass or other significant abnormality identified. Other: Ill-defined peritoneal soft tissue density is seen in anterior right lower quadrant which measures 2.6 x 1.9 cm on image 107/503. This is increased since previous study, and peritoneal carcinomatosis cannot be excluded. No other masses or ascites visualized. Musculoskeletal:  No suspicious bone lesions identified. IMPRESSION: 1. Gradual in slight increase size of 2.3 cm spiculated nodule in peripheral left upper lobe, suspicious for low-grade adenocarcinoma. 2. New ill-defined peritoneal soft tissue density in anterior right lower quadrant. Peritoneal carcinomatosis cannot be excluded. Consider further evaluation with PET-CT scan, or short-term follow-up by CT in 3 months. Electronically Signed   By: Marlaine Hind M.D.   On: 02/25/2020 11:19     Assessment and plan- Patient is a 74 y.o. female with history of stage III colon cancer s/p resection, limited stage small cell lung cancer stage I s/p concurrent radiation with 4 cycles of carboplatin and etoposide. This is a routine f/u visit to discuss CT scan results  I have reviewed CT chest abdomen pelvis images independently and discussed findings with the patient.Currently she has no significant findings on her CT.  Significant left upper lobe lung nodule without any significant uptake on prior PET scan but that appears to be growing further.  Today it measures 2.3 x 1.9 cm as compared to 2 x 0.6 cm previously.  She was also noted to have a 2.6 x 1.9 cm ill-defined better bone and soft tissue density and peritoneal carcinomatosis could not be  excluded.  Again  advised will be some normalities for that I will likely do PET CT scan to decide about further biopsies if need be.  I would like to discuss her scans at the tumor board following the PET scan.  Patient verbalized understanding.  I will see her after the PET scan results are back   Visit Diagnosis 1. Encounter for follow-up surveillance of lung cancer   2. Abnormal CT of the abdomen      Dr. Randa Evens, MD, MPH [facility] at [facility] [phone] 03/05/2020 11:43 AM

## 2020-03-10 ENCOUNTER — Other Ambulatory Visit: Payer: Medicare Other

## 2020-03-10 ENCOUNTER — Other Ambulatory Visit: Payer: Self-pay

## 2020-03-10 ENCOUNTER — Ambulatory Visit
Admission: RE | Admit: 2020-03-10 | Discharge: 2020-03-10 | Disposition: A | Discharge status: Home / Self Care | Payer: Medicare Other | Source: Ambulatory Visit | Attending: Oncology | Admitting: Oncology

## 2020-03-10 DIAGNOSIS — M47812 Spondylosis without myelopathy or radiculopathy, cervical region: Secondary | ICD-10-CM | POA: Insufficient documentation

## 2020-03-10 DIAGNOSIS — Z85118 Personal history of other malignant neoplasm of bronchus and lung: Secondary | ICD-10-CM | POA: Insufficient documentation

## 2020-03-10 DIAGNOSIS — Z08 Encounter for follow-up examination after completed treatment for malignant neoplasm: Secondary | ICD-10-CM

## 2020-03-10 DIAGNOSIS — R935 Abnormal findings on diagnostic imaging of other abdominal regions, including retroperitoneum: Secondary | ICD-10-CM | POA: Diagnosis present

## 2020-03-10 DIAGNOSIS — I700 Atherosclerosis of aorta: Secondary | ICD-10-CM | POA: Diagnosis not present

## 2020-03-10 DIAGNOSIS — R918 Other nonspecific abnormal finding of lung field: Secondary | ICD-10-CM | POA: Diagnosis not present

## 2020-03-10 DIAGNOSIS — Z85038 Personal history of other malignant neoplasm of large intestine: Secondary | ICD-10-CM | POA: Insufficient documentation

## 2020-03-10 LAB — GLUCOSE, CAPILLARY: Glucose-Capillary: 137 mg/dL — ABNORMAL HIGH (ref 70–99)

## 2020-03-10 MED ORDER — FLUDEOXYGLUCOSE F - 18 (FDG) INJECTION
8.5000 | Freq: Once | INTRAVENOUS | Status: AC | PRN
  Administered 2020-03-10: 8.97 via INTRAVENOUS

## 2020-03-10 NOTE — Progress Notes (Signed)
Tumor Board Documentation  Gloria Allen was presented by Dr Janese Banks at our Tumor Board on 03/10/2020, which included representatives from medical oncology, radiation oncology, internal medicine, navigation, pathology, radiology, surgical, pharmacy, genetics, research.  Gloria Allen currently presents as a current patient, for discussion with history of the following treatments: active survellience, neoadjuvant chemoradiation.  Additionally, we reviewed previous medical and familial history, history of present illness, and recent lab results along with all available histopathologic and imaging studies. The tumor board considered available treatment options and made the following recommendations: Biopsy (to be determined after results of PET Scan)    The following procedures/referrals were also placed: No orders of the defined types were placed in this encounter.   Clinical Trial Status: not discussed   Staging used: Not Applicable  National site-specific guidelines   were discussed with respect to the case.  Tumor board is a meeting of clinicians from various specialty areas who evaluate and discuss patients for whom a multidisciplinary approach is being considered. Final determinations in the plan of care are those of the provider(s). The responsibility for follow up of recommendations given during tumor board is that of the provider.   Todays extended care, comprehensive team conference, Gloria Allen was not present for the discussion and was not examined.   Multidisciplinary Tumor Board is a multidisciplinary case peer review process.  Decisions discussed in the Multidisciplinary Tumor Board reflect the opinions of the specialists present at the conference without having examined the patient.  Ultimately, treatment and diagnostic decisions rest with the primary provider(s) and the patient.

## 2020-03-14 ENCOUNTER — Other Ambulatory Visit: Payer: Self-pay

## 2020-03-14 ENCOUNTER — Inpatient Hospital Stay (HOSPITAL_BASED_OUTPATIENT_CLINIC_OR_DEPARTMENT_OTHER): Payer: Medicare Other | Admitting: Oncology

## 2020-03-14 ENCOUNTER — Encounter: Payer: Self-pay | Admitting: Oncology

## 2020-03-14 VITALS — BP 185/82 | HR 120 | Temp 99.2°F | Resp 18 | Wt 151.7 lb

## 2020-03-14 DIAGNOSIS — Z85118 Personal history of other malignant neoplasm of bronchus and lung: Secondary | ICD-10-CM | POA: Diagnosis not present

## 2020-03-14 DIAGNOSIS — Z85038 Personal history of other malignant neoplasm of large intestine: Secondary | ICD-10-CM | POA: Diagnosis not present

## 2020-03-14 DIAGNOSIS — C3490 Malignant neoplasm of unspecified part of unspecified bronchus or lung: Secondary | ICD-10-CM | POA: Diagnosis not present

## 2020-03-14 MED ORDER — AMOXICILLIN-POT CLAVULANATE 875-125 MG PO TABS
1.0000 | ORAL_TABLET | Freq: Two times a day (BID) | ORAL | 0 refills | Status: DC
Start: 2020-03-14 — End: 2020-04-19

## 2020-03-14 NOTE — Progress Notes (Signed)
Pt very nervous about today's visit and results.  Pt states "feel nervous all the time and I have no energy".

## 2020-03-17 NOTE — Progress Notes (Signed)
Hematology/Oncology Consult note [facility]  [phone] [phone]  Patient Care Team: [name], MD as PCP - General (Family Medicine)   Name of the patient: Gloria Allen  [id number]  May 14, 1946   Date of visit: 03/17/20  Diagnosis-  1.Stage III colon cancer status post surgery 2.Limited stage small cell lung cancer  Chief complaint/ Reason for visit- discuss pet scan results and further management  Heme/Onc history: patient is a 74 year old African-American female who recently underwent screening colonoscopy by Dr. Bonna Gains.Colonoscopy showed villous nonobstructing large mass in the sigmoid colon 12 cm proximal to the anus. Mass was partially circumferential. Biopsy showed invasive adenocarcinoma. Patient was seen by Dr. Gretchen Short underwent hemicolectomy with lymph node sampling on 11/27/2018. Final pathology showed invasive colorectal carcinoma 3.5 cm, grade 2. Tumor invades through muscularis propria into the peri-colorectal tissue. Margins negative. Tumor deposits present, 2. 1 out of 9 lymph nodes examined was positive for malignancy. [id number].Patient was feeling poorly after surgery and she had no social support and decided against adjuvant chemotherapy.  Patient underwent CT chest to complete her staging work-up which showed 2 lung nodules one in the right upper lobe and one in the left left upper lobe.This was followed by a PET CT scan which showed with the left upper lobe nodule was not hypermetabolic.The right upper lobe lung nodule measure 1.6 cm with an SUV of 6.8. No hypermetabolic axillary mediastinal or hilar lymphadenopathy or evidence of metastatic disease elsewhere. Patient underwent CT-guided biopsy of the right upper lobe lung nodule which was consistent with small cell lung cancerpositive for CD56 and TTF-1.Patient started concurrent chemoradiation in June 2020. Carboplatin chosen instead of  cisplatin with etoposide given her age, poor social support and concern for renal dysfunction. Patient completed 4 cycles of carboplatin and etoposide on 05/19/2019   Interval history- Patient is anxious about her pet scan results. Has chronic low back pain. Denies other complaints  ECOG PS- 1 Pain scale- 0   Review of systems- Review of Systems  Constitutional: Positive for malaise/fatigue. Negative for chills, fever and weight loss.  HENT: Negative for congestion, ear discharge and nosebleeds.   Eyes: Negative for blurred vision.  Respiratory: Negative for cough, hemoptysis, sputum production, shortness of breath and wheezing.   Cardiovascular: Negative for chest pain, palpitations, orthopnea and claudication.  Gastrointestinal: Negative for abdominal pain, blood in stool, constipation, diarrhea, heartburn, melena, nausea and vomiting.  Genitourinary: Negative for dysuria, flank pain, frequency, hematuria and urgency.  Musculoskeletal: Positive for back pain. Negative for joint pain and myalgias.  Skin: Negative for rash.  Neurological: Negative for dizziness, tingling, focal weakness, seizures, weakness and headaches.  Endo/Heme/Allergies: Does not bruise/bleed easily.  Psychiatric/Behavioral: Negative for depression and suicidal ideas. The patient does not have insomnia.     Allergies  Allergen Reactions  . Ace Inhibitors Swelling  . Angiotensin Receptor Blockers Swelling  . Sulfa Antibiotics Itching  . Chlorthalidone Rash  . Flexeril [Cyclobenzaprine] Rash     Past Medical History:  Diagnosis Date  . Anxiety   . Arthritis   . Asthma   . Cervical central spinal stenosis (C3-C7) (worse at C4-5) 07/30/2017  . Cervical foraminal stenosis (C4-5 and C5-6) (Bilateral) (L>R) 07/30/2017  . Chronic lower extremity pain (Fourth Area of Pain) (Bilateral) (R>L) 06/11/2017  . Chronic neck pain (Primary Area of Pain) (Bilateral) (R>L) 06/11/2017  . Chronic sacroiliac joint pain  (Bilateral) (R>L) 06/11/2017  . Chronic shoulder pain [facility] Area of Pain) (Right) 06/11/2017  .  Chronic upper extremity pain (Fifth Area of Pain) (Bilateral) (R>L) 06/11/2017  . Colon cancer ([place]) 11/2018   Partial colon resection  . DDD (degenerative disc disease), cervical 07/30/2017  . DDD (degenerative disc disease), lumbar 07/30/2017  . Depression   . DISH (diffuse idiopathic skeletal hyperostosis) 07/30/2017  . Dyspnea    with exertion  . Entrapment syndrome 06/20/2017   2002 on R and 2010 on L  . Full thickness rotator cuff tear 06/12/2017  . GERD (gastroesophageal reflux disease)   . Grade 1 Anterolisthesis of L4 over L5 07/30/2017  . Headache   . Hx: UTI (urinary tract infection)   . Hypertension   . Inflammation of joint of shoulder region 01/15/2017  . Lumbar central spinal stenosis (L4-5) 07/30/2017  . Lumbar disc protrusion (Left: L5-S1) (Right: L1-2) 07/30/2017   L5-S1 left foraminal protrusion with L5 impingement. L1-2 right paracentral protrusion without impingement.  . Lumbar facet arthropathy (Bilateral) 07/30/2017  . Lumbar facet syndrome (Bilateral) (R>L) 07/30/2017  . Lung cancer ([place]) 11/2018   Chemo  and rad tx's  . Osteoarthritis of shoulder (Right) 01/15/2017     Past Surgical History:  Procedure Laterality Date  . ABDOMINAL HYSTERECTOMY    . APPENDECTOMY    . BREAST REDUCTION SURGERY Bilateral   . COLONOSCOPY WITH PROPOFOL N/A 11/05/2018   Procedure: COLONOSCOPY WITH PROPOFOL;  Surgeon: Virgel Manifold, MD;  Location: [facility] ENDOSCOPY;  Service: Endoscopy;  Laterality: N/A;  . COLONOSCOPY WITH PROPOFOL N/A 11/06/2018   Procedure: COLONOSCOPY WITH PROPOFOL;  Surgeon: Virgel Manifold, MD;  Location: [facility] ENDOSCOPY;  Service: Endoscopy;  Laterality: N/A;  . COLONOSCOPY WITH PROPOFOL N/A 07/15/2019   Procedure: COLONOSCOPY WITH PROPOFOL;  Surgeon: Virgel Manifold, MD;  Location: [facility] ENDOSCOPY;  Service: Endoscopy;  Laterality: N/A;  . COLOSTOMY     . DILATION AND CURETTAGE OF UTERUS    . ESOPHAGOGASTRODUODENOSCOPY (EGD) WITH PROPOFOL N/A 11/05/2018   Procedure: ESOPHAGOGASTRODUODENOSCOPY (EGD) WITH PROPOFOL;  Surgeon: Virgel Manifold, MD;  Location: [facility] ENDOSCOPY;  Service: Endoscopy;  Laterality: N/A;  . ESOPHAGOGASTRODUODENOSCOPY (EGD) WITH PROPOFOL N/A 03/27/2019   Procedure: ESOPHAGOGASTRODUODENOSCOPY (EGD) WITH PROPOFOL;  Surgeon: Lucilla Lame, MD;  Location: [facility] ENDOSCOPY;  Service: Endoscopy;  Laterality: N/A;  . ILEOSTOMY Right 11/27/2018   Procedure: ILEOSTOMY;  Surgeon: Jules Husbands, MD;  Location: [facility] ORS;  Service: General;  Laterality: Right;  . ILEOSTOMY CLOSURE N/A 08/18/2019   Procedure: ILEOSTOMY TAKEDOWN;  Surgeon: Jules Husbands, MD;  Location: [facility] ORS;  Service: General;  Laterality: N/A;  . LAPAROSCOPIC SIGMOID COLECTOMY N/A 11/27/2018   Procedure: LAPAROSCOPIC SIGMOID COLECTOMY;  Surgeon: Jules Husbands, MD;  Location: [facility] ORS;  Service: General;  Laterality: N/A;  . ORIF ANKLE FRACTURE Right 11/21/2018   Procedure: OPEN REDUCTION INTERNAL FIXATION (ORIF) ANKLE FRACTURE;  Surgeon: Corky Mull, MD;  Location: [facility] ORS;  Service: Orthopedics;  Laterality: Right;  . PORT-A-CATH REMOVAL Right 08/18/2019   Procedure: REMOVAL PORT-A-CATH;  Surgeon: Jules Husbands, MD;  Location: [facility] ORS;  Service: General;  Laterality: Right;  . PORTACATH PLACEMENT N/A 03/12/2019   Procedure: INSERTION PORT-A-CATH;  Surgeon: Jules Husbands, MD;  Location: [facility] ORS;  Service: General;  Laterality: N/A;  . REDUCTION MAMMAPLASTY    . SHOULDER ARTHROSCOPY WITH OPEN ROTATOR CUFF REPAIR AND DISTAL CLAVICLE ACROMINECTOMY Right 03/26/2017   Procedure: right shoulder arthroscopy, arthroscopic subacromial decompression, distal clavicle excision, mini open rotator cuff repair;  Surgeon: Thornton Park, MD;  Location: [facility] ORS;  Service: Orthopedics;  Laterality: Right;    Social History   Socioeconomic History  . Marital status:  Divorced    Spouse name: Not on file  . Number of children: 3  . Years of education: Not on file  . Highest education level: 11th grade  Occupational History  . Not on file  Tobacco Use  . Smoking status: Former Smoker    Packs/day: 1.00    Years: 15.00    Pack years: 15.00    Types: Cigarettes    Quit date: 2010    Years since quitting: 11.4  . Smokeless tobacco: Never Used  . Tobacco comment: quit over 30 years ago   Vaping Use  . Vaping Use: Never used  Substance and Sexual Activity  . Alcohol use: No  . Drug use: Yes    Types: Marijuana    Comment: occ  . Sexual activity: Not Currently  Other Topics Concern  . 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 She receives disability. She has 3 children and several grandchildren. She lives alone. She drives. Uses a walker and shower chair at baseline.    Social Determinants of Health   Financial Resource Strain:   . Difficulty of Paying Living Expenses:   Food Insecurity:   . Worried About Charity fundraiser in the Last Year:   . Arboriculturist in the Last Year:   Transportation Needs:   . Film/video editor (Medical):   Marland Kitchen Lack of Transportation (Non-Medical):   Physical Activity:   . Days of Exercise per Week:   . Minutes of Exercise per Session:   Stress:   . Feeling of Stress :   Social Connections:   . Frequency of Communication with Friends and Family:   . Frequency of Social Gatherings with Friends and Family:   . Attends Religious Services:   . Active Member of Clubs or Organizations:   . Attends Archivist Meetings:   Marland Kitchen Marital Status:   Intimate Partner Violence:   . Fear of Current or Ex-Partner:   . Emotionally Abused:   Marland Kitchen Physically Abused:   . Sexually Abused:     Family History  Problem Relation Age of Onset  . Hypertension Mother   . Breast cancer Mother 13  . Breast cancer Sister 39     Current Outpatient Medications:  .  amLODipine  (NORVASC) 10 MG tablet, Take 10 mg by mouth daily., Disp: , Rfl:  .  chlorthalidone (HYGROTON) 25 MG tablet, Take 25 mg by mouth daily., Disp: , Rfl:  .  cloNIDine (CATAPRES) 0.3 MG tablet, Take 0.3 mg by mouth 2 (two) times daily., Disp: , Rfl:  .  DULoxetine (CYMBALTA) 30 MG capsule, Take 30 mg by mouth daily., Disp: , Rfl:  .  hydrALAZINE (APRESOLINE) 100 MG tablet, Take 1 tablet (100 mg total) by mouth 3 (three) times daily., Disp: 90 tablet, Rfl: 0 .  HYDROcodone-acetaminophen (NORCO) 7.5-325 MG tablet, Take 1 tablet by mouth every 8 (eight) hours as needed for severe pain. Must last 30 days., Disp: 90 tablet, Rfl: 0 .  [START ON 03/25/2020] HYDROcodone-acetaminophen (NORCO) 7.5-325 MG tablet, Take 1 tablet by mouth every 8 (eight) hours as needed for severe pain. Must last 30 days., Disp: 90 tablet, Rfl: 0 .  lovastatin (MEVACOR) 40 MG tablet, Take 1 tablet (40 mg total) by mouth at bedtime., Disp: 30 tablet, Rfl: 0 .  meclizine (ANTIVERT) 12.5  MG tablet, Take 1 tablet (12.5 mg total) by mouth 3 (three) times daily as needed for dizziness., Disp: 30 tablet, Rfl: 0 .  megestrol (MEGACE) 20 MG tablet, Take 1 tablet (20 mg total) by mouth daily., Disp: 30 tablet, Rfl: 0 .  metoprolol succinate (TOPROL-XL) 100 MG 24 hr tablet, Take 100 mg by mouth daily., Disp: , Rfl:  .  mirtazapine (REMERON SOL-TAB) 15 MG disintegrating tablet, Take 15 mg by mouth at bedtime., Disp: , Rfl:  .  mirtazapine (REMERON) 15 MG tablet, Take 15 mg by mouth at bedtime., Disp: , Rfl:  .  montelukast (SINGULAIR) 10 MG tablet, Take 1 tablet (10 mg total) by mouth at bedtime as needed., Disp: 30 tablet, Rfl: 0 .  traZODone (DESYREL) 50 MG tablet, Take 50 mg by mouth at bedtime., Disp: , Rfl:  .  albuterol (VENTOLIN HFA) 108 (90 Base) MCG/ACT inhaler, Inhale 2 puffs into the lungs every 6 (six) hours as needed for wheezing or shortness of breath. (Patient not taking: Reported on 03/14/2020), Disp: 18 g, Rfl: 0 .   amoxicillin-clavulanate (AUGMENTIN) 875-125 MG tablet, Take 1 tablet by mouth 2 (two) times daily., Disp: 20 tablet, Rfl: 0 .  BREO ELLIPTA 200-25 MCG/INH AEPB, Inhale 1 puff into the lungs daily. (Patient not taking: Reported on 03/03/2020), Disp: 60 each, Rfl: 0 .  cyclobenzaprine (FLEXERIL) 10 MG tablet, Take 10 mg by mouth at bedtime. (Patient not taking: Reported on 03/14/2020), Disp: , Rfl:  .  hydrOXYzine (ATARAX/VISTARIL) 10 MG tablet, Take 1 tablet (10 mg total) by mouth 3 (three) times daily as needed for itching. (Patient not taking: Reported on 03/14/2020), Disp: 30 tablet, Rfl: 0 .  loperamide (IMODIUM) 2 MG capsule, Take 4 mg by mouth 3 (three) times daily. (Patient not taking: Reported on 03/14/2020), Disp: , Rfl:  .  metroNIDAZOLE (FLAGYL) 500 MG tablet, Take 500 mg by mouth 2 (two) times daily. (Patient not taking: Reported on 03/14/2020), Disp: , Rfl:  .  omeprazole (PRILOSEC) 40 MG capsule, Take 1 capsule (40 mg total) by mouth 2 (two) times daily. (Patient not taking: Reported on 03/03/2020), Disp: 60 capsule, Rfl: 0 .  ondansetron (ZOFRAN) 8 MG tablet, Take 1 tablet (8 mg total) by mouth every 8 (eight) hours as needed for nausea. Start on day 3 after carboplatin chemo. (Patient not taking: Reported on 03/03/2020), Disp: 30 tablet, Rfl: 0 .  potassium chloride SA (K-DUR) 20 MEQ tablet, Take 1 tablet (20 mEq total) by mouth 2 (two) times daily. (Patient not taking: Reported on 03/14/2020), Disp: 28 tablet, Rfl: 0 .  terazosin (HYTRIN) 1 MG capsule, Take 1 capsule (1 mg total) by mouth at bedtime. (Patient not taking: Reported on 03/03/2020), Disp: 30 capsule, Rfl: 0 No current facility-administered medications for this visit.  Facility-Administered Medications Ordered in Other Visits:  .  heparin lock flush 100 unit/mL, 500 Units, Intravenous, Once, Sindy Guadeloupe, MD .  heparin lock flush 100 unit/mL, 500 Units, Intravenous, Once, Sindy Guadeloupe, MD .  sodium chloride flush (NS) 0.9 %  injection 10 mL, 10 mL, Intravenous, PRN, Verlon Au, NP, 10 mL at 04/13/19 1430 .  sodium chloride flush (NS) 0.9 % injection 10 mL, 10 mL, Intravenous, Once, Sindy Guadeloupe, MD .  sodium chloride flush (NS) 0.9 % injection 10 mL, 10 mL, Intravenous, Once, Sindy Guadeloupe, MD .  sodium chloride flush (NS) 0.9 % injection 10 mL, 10 mL, Intravenous, Once, Sindy Guadeloupe, MD  Physical  exam:  Vitals:   03/14/20 1516  BP: (!) 185/82  Pulse: (!) 120  Resp: 18  Temp: 99.2 F (37.3 C)  TempSrc: Tympanic  SpO2: 100%  Weight: 151 lb 11.2 oz (68.8 kg)   Physical Exam Cardiovascular:     Rate and Rhythm: Regular rhythm. Tachycardia present.     Heart sounds: Normal heart sounds.  Pulmonary:     Effort: Pulmonary effort is normal.     Breath sounds: Normal breath sounds.  Abdominal:     General: Bowel sounds are normal.     Palpations: Abdomen is soft.  Skin:    General: Skin is warm and dry.  Neurological:     Mental Status: She is alert and oriented to person, place, and time.      CMP Latest Ref Rng & Units 03/03/2020  Glucose 70 - 99 mg/dL 172(H)  BUN 8 - 23 mg/dL 13  Creatinine 0.44 - 1.00 mg/dL 0.73  Sodium 135 - 145 mmol/L 141  Potassium 3.5 - 5.1 mmol/L 4.0  Chloride 98 - 111 mmol/L 102  CO2 22 - 32 mmol/L 29  Calcium 8.9 - 10.3 mg/dL 9.0  Total Protein 6.5 - 8.1 g/dL 7.1  Total Bilirubin 0.3 - 1.2 mg/dL 0.6  Alkaline Phos 38 - 126 U/L 98  AST 15 - 41 U/L 20  ALT 0 - 44 U/L 19   CBC Latest Ref Rng & Units 03/03/2020  WBC 4.0 - 10.5 K/uL 8.5  Hemoglobin 12.0 - 15.0 g/dL 11.0(L)  Hematocrit 36 - 46 % 33.4(L)  Platelets 150 - 400 K/uL 252    No images are attached to the encounter.  CT Chest W Contrast  Result Date: 02/25/2020 CLINICAL DATA:  Follow-up sigmoid colon carcinoma. EXAM: CT CHEST, ABDOMEN, AND PELVIS WITH CONTRAST TECHNIQUE: Multidetector CT imaging of the chest, abdomen and pelvis was performed following the standard protocol during bolus  administration of intravenous contrast. CONTRAST:  141mL OMNIPAQUE IOHEXOL 300 MG/ML  SOLN COMPARISON:  Chest CT 10/08/2018, and AP CT 09/03/2019 FINDINGS: CT CHEST FINDINGS Cardiovascular: No acute findings. Aortic and coronary artery atherosclerosis noted. Mediastinum/Lymph Nodes: No masses or pathologically enlarged lymph nodes identified. Lungs/Pleura: Stable post radiation changes throughout right paramediastinal lung zone. Three mil peripheral right upper lobe pulmonary nodule image 58/504 is stable. Another 3 mm pulmonary nodule in anterior left upper lobe on image 47/504 is also stable. Spiculated nodule in peripheral left upper lobe measures 2.3 x 1.9 cm on image 104, compared to 2.0 x 0.6 cm previously, suspicious for low-grade adenocarcinoma. No evidence of pleural effusion. Musculoskeletal:  No suspicious bone lesions identified. CT ABDOMEN AND PELVIS FINDINGS Hepatobiliary: No masses identified. Focal fatty infiltration again seen centrally adjacent to the porta hepatis. Stable tiny cyst in inferior right. Prior cholecystectomy. No evidence of biliary obstruction. Pancreas:  No mass or inflammatory changes. Spleen:  Within normal limits in size and appearance. Adrenals/Urinary tract: No masses or hydronephrosis. Stable tiny sub-cm left renal cyst. Stomach/Bowel: Stable postop changes from previous left hemicolectomy. No evidence of obstruction, inflammatory process, or abnormal fluid collections. Vascular/Lymphatic: No pathologically enlarged lymph nodes identified. No abdominal aortic aneurysm. Aortic atherosclerosis noted. Reproductive:  No mass or other significant abnormality identified. Other: Ill-defined peritoneal soft tissue density is seen in anterior right lower quadrant which measures 2.6 x 1.9 cm on image 107/503. This is increased since previous study, and peritoneal carcinomatosis cannot be excluded. No other masses or ascites visualized. Musculoskeletal:  No suspicious bone lesions  identified. IMPRESSION:  1. Gradual in slight increase size of 2.3 cm spiculated nodule in peripheral left upper lobe, suspicious for low-grade adenocarcinoma. 2. New ill-defined peritoneal soft tissue density in anterior right lower quadrant. Peritoneal carcinomatosis cannot be excluded. Consider further evaluation with PET-CT scan, or short-term follow-up by CT in 3 months. Electronically Signed   By: Marlaine Hind M.D.   On: 02/25/2020 11:19   CT ABDOMEN PELVIS W CONTRAST  Result Date: 02/25/2020 CLINICAL DATA:  Follow-up sigmoid colon carcinoma. EXAM: CT CHEST, ABDOMEN, AND PELVIS WITH CONTRAST TECHNIQUE: Multidetector CT imaging of the chest, abdomen and pelvis was performed following the standard protocol during bolus administration of intravenous contrast. CONTRAST:  166mL OMNIPAQUE IOHEXOL 300 MG/ML  SOLN COMPARISON:  Chest CT 10/08/2018, and AP CT 09/03/2019 FINDINGS: CT CHEST FINDINGS Cardiovascular: No acute findings. Aortic and coronary artery atherosclerosis noted. Mediastinum/Lymph Nodes: No masses or pathologically enlarged lymph nodes identified. Lungs/Pleura: Stable post radiation changes throughout right paramediastinal lung zone. Three mil peripheral right upper lobe pulmonary nodule image 58/504 is stable. Another 3 mm pulmonary nodule in anterior left upper lobe on image 47/504 is also stable. Spiculated nodule in peripheral left upper lobe measures 2.3 x 1.9 cm on image 104, compared to 2.0 x 0.6 cm previously, suspicious for low-grade adenocarcinoma. No evidence of pleural effusion. Musculoskeletal:  No suspicious bone lesions identified. CT ABDOMEN AND PELVIS FINDINGS Hepatobiliary: No masses identified. Focal fatty infiltration again seen centrally adjacent to the porta hepatis. Stable tiny cyst in inferior right. Prior cholecystectomy. No evidence of biliary obstruction. Pancreas:  No mass or inflammatory changes. Spleen:  Within normal limits in size and appearance. Adrenals/Urinary  tract: No masses or hydronephrosis. Stable tiny sub-cm left renal cyst. Stomach/Bowel: Stable postop changes from previous left hemicolectomy. No evidence of obstruction, inflammatory process, or abnormal fluid collections. Vascular/Lymphatic: No pathologically enlarged lymph nodes identified. No abdominal aortic aneurysm. Aortic atherosclerosis noted. Reproductive:  No mass or other significant abnormality identified. Other: Ill-defined peritoneal soft tissue density is seen in anterior right lower quadrant which measures 2.6 x 1.9 cm on image 107/503. This is increased since previous study, and peritoneal carcinomatosis cannot be excluded. No other masses or ascites visualized. Musculoskeletal:  No suspicious bone lesions identified. IMPRESSION: 1. Gradual in slight increase size of 2.3 cm spiculated nodule in peripheral left upper lobe, suspicious for low-grade adenocarcinoma. 2. New ill-defined peritoneal soft tissue density in anterior right lower quadrant. Peritoneal carcinomatosis cannot be excluded. Consider further evaluation with PET-CT scan, or short-term follow-up by CT in 3 months. Electronically Signed   By: Marlaine Hind M.D.   On: 02/25/2020 11:19   NM PET Image Restag (PS) Skull Base To Thigh  Result Date: 03/10/2020 CLINICAL DATA:  Subsequent treatment strategy for follow-up imaging displaying abnormalities in the chest and abdomen. EXAM: NUCLEAR MEDICINE PET SKULL BASE TO THIGH TECHNIQUE: 8.97 mCi F-18 FDG was injected intravenously. Full-ring PET imaging was performed from the skull base to thigh after the radiotracer. CT data was obtained and used for attenuation correction and anatomic localization. Fasting blood glucose: 137 mg/dl COMPARISON:  Multiple prior studies, most recent evaluation of Feb 25, 2020 CT chest, abdomen and pelvis. Previous PET imaging from May of 2020 FINDINGS: Mediastinal blood pool activity: SUV max 2.62 Liver activity: SUV max NA NECK: No hypermetabolic lymph nodes  in the neck. Incidental CT findings: none CHEST: Area of concern in the LEFT chest was present previously (image 95, series 3) measuring approximately 2.1 x 1.5 cm on the current study  which is limited by respiratory motion areas of some spiculated margins and a maximum SUV of 2.4. Post radiation changes along the peripheral RIGHT chest are redemonstrated. Since the short interval, prior examination of Feb 25, 2020 there are numerous nodular densities that have developed in the RIGHT chest peripheral to post treatment changes. These findings are evident in the RIGHT upper lobe, greater than 20 ill-defined nodules are present in this area largest area measuring approximately 11 mm (image 88, series 3) (SUVmax = 5.6) Focus of intense FDG uptake along the inferior aspect of post radiation changes (SUVmax = 5.4. Note that morphologically this area is not changed compared to previous imaging) nodularity also in the RIGHT lower lobe compared to previous imaging. Ground-glass nodule measuring 8 mm (image 127, series 3) Mixed density nodularity in the LEFT lung base has developed as well, this may measure as large as 2.4 by 1.9 cm in greatest dimension showing increased FDG uptake (image 122, series 3) (SUVmax = 5.2) Small lymph node just below the thoracic inlet (image 68, series 3) (SUVmax = 3.3) scattered small lymph nodes with similar uptake throughout the mediastinum and just above the LEFT hilum most displaying LEFT activity, closer to the activity of blood pool with size similar to recent comparison. Incidental CT findings: Calcified atheromatous plaque of the thoracic aorta. Normal heart size. Signs of pulmonary emphysema. Choose 1 Lesion along the RIGHT anterior chest wall 8 mm (image 73, series 3) uptake less than blood pool activity. ABDOMEN/PELVIS: Area of concern in the RIGHT lower quadrant anterior to the iliac vasculature deep to rectus musculature is unchanged based on size (image 232, series 3) (SUVmax =  3.3) significant stranding away from this area anterior to the anastomotic level and in the abdominal wall compared to previous imaging these other areas have diminished where is this additional focus is more apparent. Focal FDG uptake near the anastomotic site with additional uptake could Jason to the colon without CT correlate Incidental CT findings: Calcific atheromatous plaque in the abdominal aorta no aneurysmal dilation. SKELETON: No focal hypermetabolic activity to suggest skeletal metastasis. Incidental CT findings: Spinal degenerative changes. No destructive bone process. Degenerative changes worse in the cervical spine at multiple levels with very large anterior osteophytes. More uptake is seen in this area relative to the previous exam but the diffuse nature and the association with multiple levels favors uptake related to degenerative change. IMPRESSION: 1. Low level metabolic activity in the enlarging pulmonary nodule in the LEFT chest favors a indolent process. Bronchogenic neoplasm based on appearance is considered as the primary differential consideration. 2. Patchy areas of ground-glass and nodularity throughout the RIGHT chest and in the LEFT lower lobe are nonspecific and could be due to viral infection/pneumonitis. Correlate with any respiratory symptoms. 3. More focal activity along the posterior margin of treated disease in the RIGHT chest may relate to post treatment changes and reactive changes in the setting of potential infection. Short interval follow-up may be helpful to distinguish between these 2 possibilities. 4. Area of concern in the RIGHT lower quadrant has a morphology that suggests fat necrosis. Other signs of inflammation in the RIGHT lower quadrant that were present in December have resolved. Suggest short interval follow-up for assessment of this finding. 5. Focal areas of uptake near anastomotic sites in the abdomen are nonspecific. Consider continued colonoscopic correlation.  Physiologic and postoperative activity is favored at this time. 6. Extensive spinal degenerative change worse in the cervical spine with mild increased FDG activity  at multiple levels favored to represent degenerative change. Correlate with any worsening of symptoms in this area. These results will be called to the ordering clinician or representative by the Radiologist Assistant, and communication documented in the PACS or [employer]. Aortic Atherosclerosis (ICD10-I70.0). Electronically Signed   By: Zetta Bills M.D.   On: 03/10/2020 18:18     Assessment and plan- Patient is a 74 y.o. female with history of stage III colon cancer s/p resection, limited stage small cell lung cancer stage I s/p concurrent radiation with 4 cycles of carboplatin and etoposide.  She is here to discuss PET CT scan results and further management  I have reviewed PET/CT scan images independently and we have also reviewed her images at tumor board.Patient was previously treated with concurrent chemoradiation for right lung mass which shows radiation changes.  There is a 2.1 cm left lung mass which was present in the past as well but did not show any significant PET uptake.  Her present PET scan again shows low SUV uptake in that area with an SUV of 2.4.  There is patchy groundglass opacity noted throughout the right and left lower lobe which may represent atypical infection.  Patient does not endorse any increased shortness of breath.  I will however give her an empiric course of Augmentin for 2 weeks.  There was another possible peritoneal nodule which was of concern on CT abdomen but that looks like an area of fat necrosis on the PET scan  Based on these findings the peritoneal nodule does not require any follow-up.  I will reach out to Dr. Donella Stade to see if there would be any role forLeft upper lobe mass radiation  I will plan to repeat CT chest abdomen and pelvis with contrast in 3 months and see her following that    Visit Diagnosis 1. Hx of cancer of lung   2. Hx of colon cancer, stage III      Dr. Randa Evens, MD, MPH [facility] at [facility] [phone] 03/17/2020 1:07 PM

## 2020-03-29 ENCOUNTER — Telehealth: Payer: Self-pay | Admitting: *Deleted

## 2020-03-29 NOTE — Telephone Encounter (Signed)
I'm calling because someone from the office called me.  Call me back so I'll know the nature of this call.  Have a blessed and wonderful day.

## 2020-04-01 ENCOUNTER — Other Ambulatory Visit: Payer: Medicare Other | Admitting: Nurse Practitioner

## 2020-04-07 ENCOUNTER — Other Ambulatory Visit: Payer: Self-pay

## 2020-04-07 ENCOUNTER — Other Ambulatory Visit: Payer: Medicare Other | Admitting: Nurse Practitioner

## 2020-04-07 ENCOUNTER — Telehealth: Payer: Self-pay | Admitting: Nurse Practitioner

## 2020-04-07 NOTE — Telephone Encounter (Signed)
I called Ms. Esperanza to confirm appointment for [facility] f/u visit today, Ms. Hanawalt requesting to reschedule. Rescheduled per Ms. Band request

## 2020-04-19 ENCOUNTER — Ambulatory Visit
Payer: Medicare Other | Attending: Student in an Organized Health Care Education/Training Program | Admitting: Student in an Organized Health Care Education/Training Program

## 2020-04-19 ENCOUNTER — Other Ambulatory Visit: Payer: Self-pay

## 2020-04-19 ENCOUNTER — Encounter: Payer: Self-pay | Admitting: Student in an Organized Health Care Education/Training Program

## 2020-04-19 VITALS — BP 147/66 | HR 82 | Temp 97.3°F | Resp 18 | Ht 65.0 in | Wt 159.0 lb

## 2020-04-19 DIAGNOSIS — M5126 Other intervertebral disc displacement, lumbar region: Secondary | ICD-10-CM | POA: Diagnosis present

## 2020-04-19 DIAGNOSIS — M51369 Other intervertebral disc degeneration, lumbar region without mention of lumbar back pain or lower extremity pain: Secondary | ICD-10-CM

## 2020-04-19 DIAGNOSIS — C187 Malignant neoplasm of sigmoid colon: Secondary | ICD-10-CM

## 2020-04-19 DIAGNOSIS — M5442 Lumbago with sciatica, left side: Secondary | ICD-10-CM | POA: Insufficient documentation

## 2020-04-19 DIAGNOSIS — G8929 Other chronic pain: Secondary | ICD-10-CM

## 2020-04-19 DIAGNOSIS — M48062 Spinal stenosis, lumbar region with neurogenic claudication: Secondary | ICD-10-CM | POA: Diagnosis not present

## 2020-04-19 DIAGNOSIS — M48061 Spinal stenosis, lumbar region without neurogenic claudication: Secondary | ICD-10-CM | POA: Diagnosis present

## 2020-04-19 DIAGNOSIS — M5136 Other intervertebral disc degeneration, lumbar region: Secondary | ICD-10-CM | POA: Insufficient documentation

## 2020-04-19 DIAGNOSIS — M5441 Lumbago with sciatica, right side: Secondary | ICD-10-CM | POA: Diagnosis present

## 2020-04-19 DIAGNOSIS — M450 Ankylosing spondylitis of multiple sites in spine: Secondary | ICD-10-CM

## 2020-04-19 DIAGNOSIS — M47816 Spondylosis without myelopathy or radiculopathy, lumbar region: Secondary | ICD-10-CM

## 2020-04-19 DIAGNOSIS — G894 Chronic pain syndrome: Secondary | ICD-10-CM | POA: Diagnosis not present

## 2020-04-19 MED ORDER — HYDROCODONE-ACETAMINOPHEN 10-325 MG PO TABS: 1.0000 | ORAL_TABLET | Freq: Three times a day (TID) | ORAL | 0 refills | Status: AC | PRN

## 2020-04-19 MED ORDER — PREGABALIN 25 MG PO CAPS
ORAL_CAPSULE | ORAL | 0 refills | Status: AC
Start: 2020-04-19 — End: 2020-06-18

## 2020-04-19 NOTE — Progress Notes (Signed)
Nursing Pain Medication Assessment:  Safety precautions to be maintained throughout the outpatient stay will include: orient to surroundings, keep bed in low position, maintain call bell within reach at all times, provide assistance with transfer out of bed and ambulation.  Medication Inspection Compliance: Pill count conducted under aseptic conditions, in front of the patient. Neither the pills nor the bottle was removed from the patient's sight at any time. Once count was completed pills were immediately returned to the patient in their original bottle.  Medication: Hydrocodone/APAP Pill/Patch Count: 36 of 90 pills remain Pill/Patch Appearance: Markings consistent with prescribed medication Bottle Appearance: Standard pharmacy container. Clearly labeled. Filled Date:06 / 28/ 2021 Last Medication intake:  Today

## 2020-04-19 NOTE — Patient Instructions (Signed)
Pregabalin capsules What is this medicine? PREGABALIN (pre GAB a lin) is used to treat nerve pain from diabetes, shingles, spinal cord injury, and fibromyalgia. It is also used to control seizures in epilepsy. This medicine may be used for other purposes; ask your health care provider or pharmacist if you have questions. COMMON BRAND NAME(S): Lyrica What should I tell my health care provider before I take this medicine? They need to know if you have any of these conditions:  heart disease  history of drug abuse or alcohol abuse problem  kidney disease  lung or breathing disease  suicidal thoughts, plans, or attempt; a previous suicide attempt by you or a family member  an unusual or allergic reaction to pregabalin, gabapentin, other medicines, foods, dyes, or preservatives  pregnant or trying to get pregnant  breast-feeding How should I use this medicine? Take this medicine by mouth with a glass of water. Follow the directions on the prescription label. You can take it with or without food. If it upsets your stomach, take it with food. Take your medicine at regular intervals. Do not take it more often than directed. Do not stop taking except on your doctor's advice. A special MedGuide will be given to you by the pharmacist with each prescription and refill. Be sure to read this information carefully each time. Talk to your pediatrician regarding the use of this medicine in children. While this drug may be prescribed for children as young as 1 month for selected conditions, precautions do apply. Overdosage: If you think you have taken too much of this medicine contact a poison control center or emergency room at once. NOTE: This medicine is only for you. Do not share this medicine with others. What if I miss a dose? If you miss a dose, take it as soon as you can. If it is almost time for your next dose, take only that dose. Do not take double or extra doses. What may interact with this  medicine? This medicine may interact with the following medications:  alcohol  antihistamines for allergy, cough, and cold  certain medicines for anxiety or sleep  certain medicines for depression like amitriptyline, fluoxetine, sertraline  certain medicines for diabetes  certain medicines for seizures like phenobarbital, primidone  general anesthetics like halothane, isoflurane, methoxyflurane, propofol  local anesthetics like lidocaine, pramoxine, tetracaine  medicines that relax muscles for surgery  narcotic medicines for pain  phenothiazines like chlorpromazine, mesoridazine, prochlorperazine, thioridazine This list may not describe all possible interactions. Give your health care provider a list of all the medicines, herbs, non-prescription drugs, or dietary supplements you use. Also tell them if you smoke, drink alcohol, or use illegal drugs. Some items may interact with your medicine. What should I watch for while using this medicine? Tell your doctor or healthcare professional if your symptoms do not start to get better or if they get worse. Visit your doctor or health care professional for regular checks on your progress. Do not stop taking except on your doctor's advice. You may develop a severe reaction. Your doctor will tell you how much medicine to take. Wear a medical identification bracelet or chain if you are taking this medicine for seizures, and carry a card that describes your disease and details of your medicine and dosage times. You may get drowsy or dizzy. Do not drive, use machinery, or do anything that needs mental alertness until you know how this medicine affects you. Do not stand or sit up quickly, especially if  you are an older patient. This reduces the risk of dizzy or fainting spells. Alcohol may interfere with the effect of this medicine. Avoid alcoholic drinks. If you have a heart condition, like congestive heart failure, and notice that you are retaining  water and have swelling in your hands or feet, contact your health care provider immediately. The use of this medicine may increase the chance of suicidal thoughts or actions. Pay special attention to how you are responding while on this medicine. Any worsening of mood, or thoughts of suicide or dying should be reported to your health care professional right away. This medicine has caused reduced sperm counts in some men. This may interfere with the ability to father a child. You should talk to your doctor or health care professional if you are concerned about your fertility. Women who become pregnant while using this medicine for seizures may enroll in the [place] Pregnancy Registry by calling [id number]. This registry collects information about the safety of antiepileptic drug use during pregnancy. What side effects may I notice from receiving this medicine? Side effects that you should report to your doctor or health care professional as soon as possible:  allergic reactions like skin rash, itching or hives, swelling of the face, lips, or tongue  breathing problems  changes in vision  chest pain  confusion  jerking or unusual movements of any part of your body  loss of memory  muscle pain, tenderness, or weakness  suicidal thoughts or other mood changes  swelling of the ankles, feet, hands  unusual bruising or bleeding Side effects that usually do not require medical attention (report to your doctor or health care professional if they continue or are bothersome):  dizziness  drowsiness  dry mouth  headache  nausea  tremors  trouble sleeping  weight gain This list may not describe all possible side effects. Call your doctor for medical advice about side effects. You may report side effects to FDA at 1-800-FDA-1088. Where should I keep my medicine? Keep out of the reach of children. This medicine can be abused. Keep your medicine in a safe  place to protect it from theft. Do not share this medicine with anyone. Selling or giving away this medicine is dangerous and against the law. This medicine may cause accidental overdose and death if it taken by other adults, children, or pets. Mix any unused medicine with a substance like cat litter or coffee grounds. Then throw the medicine away in a sealed container like a sealed bag or a coffee can with a lid. Do not use the medicine after the expiration date. Store at room temperature between 15 and 30 degrees C (59 and 86 degrees F). NOTE: This sheet is a summary. It may not cover all possible information. If you have questions about this medicine, talk to your doctor, pharmacist, or health care provider.  2020 [employer]vier/Gold Standard (2018-09-19 13:15:55)

## 2020-04-19 NOTE — Progress Notes (Signed)
PROVIDER NOTE: Information contained herein reflects review and annotations entered in association with encounter. Interpretation of such information and data should be left to medically-trained personnel. Information provided to patient can be located elsewhere in the medical record under "Patient Instructions". Document created using STT-dictation technology, any transcriptional errors that may result from process are unintentional.    Patient: Gloria Allen  Service Category: E/M  Provider: Gillis Santa, MD  DOB: 04/07/1946  DOS: 04/19/2020  Specialty: Interventional Pain Management  MRN: [id number]  Setting: Ambulatory outpatient  PCP: [name], MD  Type: Established Patient    Referring Provider: [name], MD  Location: Office  Delivery: Face-to-face     HPI  Reason for encounter: Ms. Gloria Allen, a 74 y.o. year old female, is here today for evaluation and management of her Chronic pain syndrome [G89.4]. Ms. Amalfitano primary complain today is Back Pain and Leg Pain Last encounter: Practice (02/16/2020). My last encounter with her was on 02/16/2020. Pertinent problems: Ms. Benn has Anxiety; Depression; Long term current use of opiate analgesic; Chronic pain syndrome; Lumbar facet arthropathy (Bilateral); Lumbar central spinal stenosis (L4-5); Lumbar foraminal stenosis (Bilateral: L4-5) (Left: L5-S1); Ankylosing spondylitis of multiple sites in spine [facility]); and Malignant neoplasm of colon ([place]) on their pertinent problem list. Pain Assessment: Severity of Chronic pain is reported as a 8 /10. Location: Back Lower/radiates down both legs in the back to knee. Onset: More than a month ago. Quality: Aching. Timing: Constant. Modifying factor(s): bc powder. Vitals:  height is '5\' 5"'$  (1.651 m) and weight is 159 lb (72.1 kg). Her temperature is 97.3 F (36.3 C) (abnormal). Her blood pressure is 147/66 (abnormal) and her pulse is 82. Her respiration is 18 and oxygen saturation is 100%.   Patient  is not experiencing adequate analgesic benefit with her current regimen.  She is on hydrocodone 7.5 mg 3 times daily as needed, quantity 90/month that she generally takes 3 times a day.  She lives by herself and is in the process of trying to get a chairlift for her house which will make it easier for her to perform ADLs and get around.  She is having trouble finding a company that will cover it through her insurance.  She is also dealing with mold at her house that is impacting her nasal passage.  She has had someone come out to take a look.  She has failed gabapentin in the past as it was not helpful.  Today we discussed adding Lyrica to her medication regimen to optimize pain relief.  We will also increase her hydrocodone to 10 mg 3 times daily as needed with hopes of optimizing her pain relief as well.  Patient will follow up in 8 weeks.  I also discussed interventional options with the patient however she explicitly stated that she is not interested in any injection or interventional therapy.  She states that she is scared of having needles placed in her back.  Pharmacotherapy Assessment   Analgesic: 03/28/2020  2   02/16/2020  Hydrocodone-Acetamin 7.5-325  90.00  30 Bi Lat   [id number]   Wal (2025)   0/0  22.50 MME  Medicare   [place]     Monitoring: [place] PMP: PDMP not reviewed this encounter.       Pharmacotherapy: No side-effects or adverse reactions reported. Compliance: No problems identified. Effectiveness: Clinically acceptable.  Dewayne Shorter, RN  04/19/2020  2:29 PM  Sign when Signing Visit Nursing Pain Medication Assessment:  Safety precautions to be maintained throughout the outpatient stay will include: orient to surroundings, keep bed in low position, maintain call bell within reach at all times, provide assistance with transfer out of bed and ambulation.  Medication Inspection Compliance: Pill count conducted under aseptic conditions, in front of the patient. Neither the pills nor the  bottle was removed from the patient's sight at any time. Once count was completed pills were immediately returned to the patient in their original bottle.  Medication: Hydrocodone/APAP Pill/Patch Count: 36 of 90 pills remain Pill/Patch Appearance: Markings consistent with prescribed medication Bottle Appearance: Standard pharmacy container. Clearly labeled. Filled Date:06 / 28/ 2021 Last Medication intake:  Today    UDS:  Summary  Date Value Ref Range Status  11/12/2019 Note  Final    Comment:    ==================================================================== Compliance Drug Analysis, Ur ==================================================================== Test                             Result       Flag       Units Drug Present and Declared for Prescription Verification   Pregabalin                     PRESENT      EXPECTED   Mirtazapine                    PRESENT      EXPECTED   Sertraline                     PRESENT      EXPECTED   Desmethylsertraline            PRESENT      EXPECTED    Desmethylsertraline is an expected metabolite of sertraline.   Clonidine                      PRESENT      EXPECTED   Metoprolol                     PRESENT      EXPECTED Drug Present not Declared for Prescription Verification   7-aminoclonazepam              96           UNEXPECTED ng/mg creat    7-aminoclonazepam is an expected metabolite of clonazepam. Source of    clonazepam is a scheduled prescription medication. Drug Absent but Declared for Prescription Verification   Cyclobenzaprine                Not Detected UNEXPECTED   Ibuprofen                      Not Detected UNEXPECTED    Ibuprofen, as indicated in the declared medication list, is not    always detected even when used as directed.   Hydroxyzine                    Not Detected UNEXPECTED   Lidocaine                      Not Detected UNEXPECTED    Lidocaine, as indicated in the declared medication list, is not    always  detected even when used as directed. ==================================================================== Test  Result    Flag   Units      Ref Range   Creatinine              81               mg/dL      >=20 ==================================================================== Declared Medications:  The flagging and interpretation on this report are based on the  following declared medications.  Unexpected results may arise from  inaccuracies in the declared medications.  **Note: The testing scope of this panel includes these medications:  Clonidine  Cyclobenzaprine (Flexeril)  Hydroxyzine  Metoprolol  Mirtazapine  Pregabalin  Sertraline  **Note: The testing scope of this panel does not include small to  moderate amounts of these reported medications:  Ibuprofen  Topical Lidocaine (EMLA)  **Note: The testing scope of this panel does not include the  following reported medications:  Albuterol  Amlodipine  Benzonatate  Chlorthalidone  Fluticasone (Breo)  Hydralazine  Loratadine  Lovastatin  Meclizine  Montelukast  Omeprazole  Ondansetron  Potassium (Klor-Con)  Prilocaine (EMLA)  Terazosin  Vilanterol (Breo) ==================================================================== For clinical consultation, please call [phone]. ====================================================================      ROS  Constitutional: Denies any fever or chills Gastrointestinal: No reported hemesis, hematochezia, vomiting, or acute GI distress Musculoskeletal: Denies any acute onset joint swelling, redness, loss of ROM, or weakness Neurological: No reported episodes of acute onset apraxia, aphasia, dysarthria, agnosia, amnesia, paralysis, loss of coordination, or loss of consciousness  Medication Review  DULoxetine, HYDROcodone-acetaminophen, Melatonin-Pyridoxine, amLODipine, chlorthalidone, cloNIDine, hydrALAZINE, hydrocortisone, lovastatin, meclizine,  megestrol, metoprolol succinate, metroNIDAZOLE, mirtazapine, montelukast, oxybutynin, pregabalin, and traZODone  History Review  Allergy: Ms. Fredricksen is allergic to ace inhibitors, angiotensin receptor blockers, sulfa antibiotics, chlorthalidone, and flexeril [cyclobenzaprine]. Drug: Ms. Ryback  reports current drug use. Drug: Marijuana. Alcohol:  reports no history of alcohol use. Tobacco:  reports that she quit smoking about 11 years ago. Her smoking use included cigarettes. She has a 15.00 pack-year smoking history. She has never used smokeless tobacco. Social: Ms. Amendola  reports that she quit smoking about 11 years ago. Her smoking use included cigarettes. She has a 15.00 pack-year smoking history. She has never used smokeless tobacco. She reports current drug use. Drug: Marijuana. She reports that she does not drink alcohol. Medical:  has a past medical history of Anxiety, Arthritis, Asthma, Cervical central spinal stenosis (C3-C7) (worse at C4-5) (07/30/2017), Cervical foraminal stenosis (C4-5 and C5-6) (Bilateral) (L>R) (07/30/2017), Chronic lower extremity pain (Fourth Area of Pain) (Bilateral) (R>L) (06/11/2017), Chronic neck pain (Primary Area of Pain) (Bilateral) (R>L) (06/11/2017), Chronic sacroiliac joint pain (Bilateral) (R>L) (06/11/2017), Chronic shoulder pain (Tertiary Area of Pain) (Right) (06/11/2017), Chronic upper extremity pain (Fifth Area of Pain) (Bilateral) (R>L) (06/11/2017), Colon cancer ([place]) (11/2018), DDD (degenerative disc disease), cervical (07/30/2017), DDD (degenerative disc disease), lumbar (07/30/2017), Depression, DISH (diffuse idiopathic skeletal hyperostosis) (07/30/2017), Dyspnea, Entrapment syndrome (06/20/2017), Full thickness rotator cuff tear (06/12/2017), GERD (gastroesophageal reflux disease), Grade 1 Anterolisthesis of L4 over L5 (07/30/2017), Headache, UTI (urinary tract infection), Hypertension, Inflammation of joint of shoulder region (01/15/2017), Lumbar central spinal  stenosis (L4-5) (07/30/2017), Lumbar disc protrusion (Left: L5-S1) (Right: L1-2) (07/30/2017), Lumbar facet arthropathy (Bilateral) (07/30/2017), Lumbar facet syndrome (Bilateral) (R>L) (07/30/2017), Lung cancer ([place]) (11/2018), and Osteoarthritis of shoulder (Right) (01/15/2017). Surgical: Ms. Minogue  has a past surgical history that includes Abdominal hysterectomy; Appendectomy; Breast reduction surgery (Bilateral); Dilation and curettage of uterus; Shoulder arthroscopy with open rotator cuff repair and distal clavicle acrominectomy (Right, 03/26/2017); Reduction  mammaplasty; Colonoscopy with propofol (N/A, 11/05/2018); Esophagogastroduodenoscopy (egd) with propofol (N/A, 11/05/2018); Colonoscopy with propofol (N/A, 11/06/2018); ORIF ankle fracture (Right, 11/21/2018); Laparoscopic sigmoid colectomy (N/A, 11/27/2018); Ileostomy (Right, 11/27/2018); Colostomy; Portacath placement (N/A, 03/12/2019); Esophagogastroduodenoscopy (egd) with propofol (N/A, 03/27/2019); Colonoscopy with propofol (N/A, 07/15/2019); Ileostomy closure (N/A, 08/18/2019); and Port-a-cath removal (Right, 08/18/2019). Family: family history includes Breast cancer (age of onset: 9) in her mother; Breast cancer (age of onset: 93) in her sister; Hypertension in her mother.  Laboratory Chemistry Profile   Renal Lab Results  Component Value Date   BUN 13 03/03/2020   CREATININE 0.73 03/03/2020   BCR 15 08/21/2018   GFRAA >60 03/03/2020   GFRNONAA >60 03/03/2020     Hepatic Lab Results  Component Value Date   AST 20 03/03/2020   ALT 19 03/03/2020   ALBUMIN 3.7 03/03/2020   ALKPHOS 98 03/03/2020   LIPASE 23 03/24/2019     Electrolytes Lab Results  Component Value Date   NA 141 03/03/2020   K 4.0 03/03/2020   CL 102 03/03/2020   CALCIUM 9.0 03/03/2020   MG 1.8 08/28/2019   PHOS 2.8 08/23/2019     Bone Lab Results  Component Value Date   VD25OH 24.9 (L) 04/22/2018   25OHVITD1 24 (L) 06/11/2017   25OHVITD2 <1.0 06/11/2017    25OHVITD3 24 06/11/2017     Inflammation (CRP: Acute Phase) (ESR: Chronic Phase) Lab Results  Component Value Date   CRP 8.5 (H) 06/11/2017   ESRSEDRATE 22 06/11/2017   LATICACIDVEN 1.9 03/25/2019       Note: Above Lab results reviewed.  Recent Imaging Review  NM PET Image Restag (PS) Skull Base To Thigh CLINICAL DATA:  Subsequent treatment strategy for follow-up imaging displaying abnormalities in the chest and abdomen.  EXAM: NUCLEAR MEDICINE PET SKULL BASE TO THIGH  TECHNIQUE: 8.97 mCi F-18 FDG was injected intravenously. Full-ring PET imaging was performed from the skull base to thigh after the radiotracer. CT data was obtained and used for attenuation correction and anatomic localization.  Fasting blood glucose: 137 mg/dl  COMPARISON:  Multiple prior studies, most recent evaluation of Feb 25, 2020 CT chest, abdomen and pelvis. Previous PET imaging from May of 2020  FINDINGS: Mediastinal blood pool activity: SUV max 2.62  Liver activity: SUV max NA  NECK: No hypermetabolic lymph nodes in the neck.  Incidental CT findings: none  CHEST: Area of concern in the LEFT chest was present previously (image 95, series 3) measuring approximately 2.1 x 1.5 cm on the current study which is limited by respiratory motion areas of some spiculated margins and a maximum SUV of 2.4. Post radiation changes along the peripheral RIGHT chest are redemonstrated.  Since the short interval, prior examination of Feb 25, 2020 there are numerous nodular densities that have developed in the RIGHT chest peripheral to post treatment changes. These findings are evident in the RIGHT upper lobe, greater than 20 ill-defined nodules are present in this area largest area measuring approximately 11 mm (image 88, series 3) (SUVmax = 5.6)  Focus of intense FDG uptake along the inferior aspect of post radiation changes (SUVmax = 5.4. Note that morphologically this area is not changed compared to  previous imaging) nodularity also in the RIGHT lower lobe compared to previous imaging. Ground-glass nodule measuring 8 mm (image 127, series 3)  Mixed density nodularity in the LEFT lung base has developed as well, this may measure as large as 2.4 by 1.9 cm in greatest dimension showing increased FDG  uptake (image 122, series 3) (SUVmax = 5.2)  Small lymph node just below the thoracic inlet (image 68, series 3) (SUVmax = 3.3) scattered small lymph nodes with similar uptake throughout the mediastinum and just above the LEFT hilum most displaying LEFT activity, closer to the activity of blood pool with size similar to recent comparison.  Incidental CT findings: Calcified atheromatous plaque of the thoracic aorta. Normal heart size. Signs of pulmonary emphysema. Choose 1  Lesion along the RIGHT anterior chest wall 8 mm (image 73, series 3) uptake less than blood pool activity.  ABDOMEN/PELVIS: Area of concern in the RIGHT lower quadrant anterior to the iliac vasculature deep to rectus musculature is unchanged based on size (image 232, series 3) (SUVmax = 3.3) significant stranding away from this area anterior to the anastomotic level and in the abdominal wall compared to previous imaging these other areas have diminished where is this additional focus is more apparent.  Focal FDG uptake near the anastomotic site with additional uptake could Jason to the colon without CT correlate  Incidental CT findings: Calcific atheromatous plaque in the abdominal aorta no aneurysmal dilation.  SKELETON: No focal hypermetabolic activity to suggest skeletal metastasis.  Incidental CT findings: Spinal degenerative changes. No destructive bone process. Degenerative changes worse in the cervical spine at multiple levels with very large anterior osteophytes. More uptake is seen in this area relative to the previous exam but the diffuse nature and the association with multiple levels favors  uptake related to degenerative change.  IMPRESSION: 1. Low level metabolic activity in the enlarging pulmonary nodule in the LEFT chest favors a indolent process. Bronchogenic neoplasm based on appearance is considered as the primary differential consideration. 2. Patchy areas of ground-glass and nodularity throughout the RIGHT chest and in the LEFT lower lobe are nonspecific and could be due to viral infection/pneumonitis. Correlate with any respiratory symptoms. 3. More focal activity along the posterior margin of treated disease in the RIGHT chest may relate to post treatment changes and reactive changes in the setting of potential infection. Short interval follow-up may be helpful to distinguish between these 2 possibilities. 4. Area of concern in the RIGHT lower quadrant has a morphology that suggests fat necrosis. Other signs of inflammation in the RIGHT lower quadrant that were present in December have resolved. Suggest short interval follow-up for assessment of this finding. 5. Focal areas of uptake near anastomotic sites in the abdomen are nonspecific. Consider continued colonoscopic correlation. Physiologic and postoperative activity is favored at this time. 6. Extensive spinal degenerative change worse in the cervical spine with mild increased FDG activity at multiple levels favored to represent degenerative change. Correlate with any worsening of symptoms in this area.  These results will be called to the ordering clinician or representative by the Radiologist Assistant, and communication documented in the PACS or [employer].  Aortic Atherosclerosis (ICD10-I70.0).  Electronically Signed   By: Zetta Bills M.D.   On: 03/10/2020 18:18 Note: Reviewed        Physical Exam  General appearance: alert, cooperative, slowed mentation and in mild distress Mental status: Alert, oriented x 3 (person, place, & time)       Respiratory: No evidence of acute respiratory  distress Eyes: PERLA Vitals: BP (!) 147/66   Pulse 82   Temp (!) 97.3 F (36.3 C)   Resp 18   Ht '5\' 5"'$  (1.651 m)   Wt 159 lb (72.1 kg)   SpO2 100%   BMI 26.46 kg/m  BMI:  Estimated body mass index is 26.46 kg/m as calculated from the following:   Height as of this encounter: '5\' 5"'$  (1.651 m).   Weight as of this encounter: 159 lb (72.1 kg). Ideal: Ideal body weight: 57 kg (125 lb 10.6 oz) Adjusted ideal body weight: 63 kg (139 lb)  Lumbar Spine Area Exam  Skin & Axial Inspection: No masses, redness, or swelling Alignment: Symmetrical Functional ROM: Pain restricted ROM affecting both sides Stability: No instability detected Muscle Tone/Strength: Functionally intact. No obvious neuro-muscular anomalies detected. Sensory (Neurological): Musculoskeletal pain pattern  Gait & Posture Assessment  Ambulation: Patient came in today in a wheel chair Gait: Limited. Using assistive device to ambulate Posture: Difficulty standing up straight, due to pain  Lower Extremity Exam    Side: Right lower extremity  Side: Left lower extremity  Stability: No instability observed          Stability: No instability observed          Skin & Extremity Inspection: Skin color, temperature, and hair growth are WNL. No peripheral edema or cyanosis. No masses, redness, swelling, asymmetry, or associated skin lesions. No contractures.  Skin & Extremity Inspection: Skin color, temperature, and hair growth are WNL. No peripheral edema or cyanosis. No masses, redness, swelling, asymmetry, or associated skin lesions. No contractures.  Functional ROM: Pain restricted ROM for hip and knee joints          Functional ROM: Pain restricted ROM for hip and knee joints          Muscle Tone/Strength: Functionally intact. No obvious neuro-muscular anomalies detected.  Muscle Tone/Strength: Functionally intact. No obvious neuro-muscular anomalies detected.  Sensory (Neurological): Neurogenic pain pattern        Sensory  (Neurological): Neurogenic pain pattern        DTR: Patellar: deferred today Achilles: deferred today Plantar: deferred today  DTR: Patellar: deferred today Achilles: deferred today Plantar: deferred today  Palpation: No palpable anomalies  Palpation: No palpable anomalies     Assessment   Status Diagnosis  Worsening Persistent Persistent 1. Chronic pain syndrome   2. Lumbar central spinal stenosis (L4-5)   3. Ankylosing spondylitis of multiple sites in spine ([place])   4. Lumbar facet arthropathy (Bilateral)   5. Lumbar facet syndrome (Bilateral) (R>L)   6. DDD (degenerative disc disease), lumbar   7. Chronic low back pain (Secondary Area of Pain) (Bilateral) (R>L)   8. Lumbar disc protrusion (Left: L5-S1) (Right: L1-2)   9. Malignant neoplasm of sigmoid colon ([place])   10. Lumbar foraminal stenosis (Bilateral: L4-5) (Left: L5-S1)      Updated Problems: Problem  Malignant Neoplasm of Colon ([facility])  Ankylosing Spondylitis of Multiple Sites in Spine ([facility])  Lumbar facet arthropathy (Bilateral)  Lumbar central spinal stenosis (L4-5)  Lumbar foraminal stenosis (Bilateral: L4-5) (Left: L5-S1)   L4-5 advanced facet arthropathy with joint effusions and anterolisthesis. Moderate spinal and bilateral foraminal stenosis. L5-S1 left foraminal protrusion with L5 impingement.   Long Term Current Use of Opiate Analgesic  Chronic Pain Syndrome  Anxiety  Depression    Plan of Care   Ms. Gloria Allen has a current medication list which includes the following long-term medication(s): amlodipine, duloxetine, hydralazine, lovastatin, metoprolol succinate, montelukast, and pregabalin.  Given suboptimal pain relief with hydrocodone at 7.5 mg 3 times daily as needed, will increase to 10 mg 3 times daily as needed.  Also recommend adding Lyrica as below.  Patient did not respond to gabapentin.  She is not interested  in any interventional pain therapies including lumbar facet medial branch nerve  blocks which in my opinion could be helpful for her low back pain.  She states that she is afraid of needles.  We will focus on medication management as below and patient will follow up in 8 weeks.  Pharmacotherapy (Medications Ordered): Meds ordered this encounter  Medications  . HYDROcodone-acetaminophen (NORCO) 10-325 MG tablet    Sig: Take 1 tablet by mouth every 8 (eight) hours as needed for severe pain. Must last 30 days.    Dispense:  90 tablet    Refill:  0    Chronic Pain. (STOP Act - Not applicable). Fill one day early if closed on scheduled refill date.  Marland Kitchen HYDROcodone-acetaminophen (NORCO) 10-325 MG tablet    Sig: Take 1 tablet by mouth every 8 (eight) hours as needed for severe pain. Must last 30 days.    Dispense:  90 tablet    Refill:  0    Chronic Pain. (STOP Act - Not applicable). Fill one day early if closed on scheduled refill date.  . pregabalin (LYRICA) 25 MG capsule    Sig: Take 1 capsule (25 mg total) by mouth at bedtime for 30 days, THEN 1 capsule (25 mg total) 2 (two) times daily.    Dispense:  90 capsule    Refill:  0   Follow-up plan:   Return in about 8 weeks (around 06/14/2020) for Medication Management, in person.   Recent Visits Date Type Provider Dept  02/16/20 Office Visit Gillis Santa, MD [facility]  Showing recent visits within past 90 days and meeting all other requirements Today's Visits Date Type Provider Dept  04/19/20 Office Visit Gillis Santa, MD [facility]  Showing today's visits and meeting all other requirements Future Appointments Date Type Provider Dept  06/21/20 Appointment Gillis Santa, MD [facility]  Showing future appointments within next 90 days and meeting all other requirements  I discussed the assessment and treatment plan with the patient. The patient was provided an opportunity to ask questions and all were answered. The patient agreed with the plan and demonstrated an understanding of the  instructions.  Patient advised to call back or seek an in-person evaluation if the symptoms or condition worsens.  Duration of encounter:30 minutes.  Note by: Gillis Santa, MD Date: 04/19/2020; Time: 2:54 PM

## 2020-04-20 ENCOUNTER — Encounter: Payer: Self-pay | Admitting: Radiation Oncology

## 2020-04-20 ENCOUNTER — Ambulatory Visit
Admission: RE | Admit: 2020-04-20 | Discharge: 2020-04-20 | Disposition: A | Discharge status: Home / Self Care | Payer: Medicare Other | Source: Ambulatory Visit | Attending: Radiation Oncology | Admitting: Radiation Oncology

## 2020-04-20 VITALS — BP 118/68 | HR 74 | Temp 96.7°F | Resp 16 | Wt 158.2 lb

## 2020-04-20 DIAGNOSIS — C3411 Malignant neoplasm of upper lobe, right bronchus or lung: Secondary | ICD-10-CM | POA: Diagnosis not present

## 2020-04-20 DIAGNOSIS — C3412 Malignant neoplasm of upper lobe, left bronchus or lung: Secondary | ICD-10-CM

## 2020-04-20 DIAGNOSIS — Z923 Personal history of irradiation: Secondary | ICD-10-CM | POA: Insufficient documentation

## 2020-04-20 DIAGNOSIS — R918 Other nonspecific abnormal finding of lung field: Secondary | ICD-10-CM | POA: Insufficient documentation

## 2020-04-20 DIAGNOSIS — Z87891 Personal history of nicotine dependence: Secondary | ICD-10-CM | POA: Insufficient documentation

## 2020-04-20 NOTE — Progress Notes (Signed)
Radiation Oncology Follow up Note  Name: Gloria Allen   Date:   04/20/2020 MRN:  [id number] DOB: Feb 22, 1946    This 74 y.o. female presents to the clinic today for 11-month follow-up status post concurrent chemoradiation therapy for limited stage small cell lung cancer.  REFERRING PROVIDER: [name], MD  HPI: Patient is a 74 year old female now out 11 months having completed concurrent chemoradiation therapy for limited stage small cell lung cancer seen today in routine follow-up she is doing well specifically denies cough hemoptysis or chest tightness.  She had a recent CT scan which demonstrated a new left upper lobe nodule on PET CT scan.  This was not hypermetabolic.  This was favored to be a indolent process.  There is also patchy areas of groundglass nodularity to the right chest and left lower lobe nonspecific.  Again she is asymptomatic.  COMPLICATIONS OF TREATMENT: none  FOLLOW UP COMPLIANCE: keeps appointments   PHYSICAL EXAM:  BP 118/68 (BP Location: Left Arm, Patient Position: Sitting, Cuff Size: Normal)   Pulse 74   Temp (!) 96.7 F (35.9 C) (Tympanic)   Resp 16   Wt 158 lb 3.2 oz (71.8 kg)   BMI 26.33 kg/m  Well-developed well-nourished patient in NAD. HEENT reveals PERLA, EOMI, discs not visualized.  Oral cavity is clear. No oral mucosal lesions are identified. Neck is clear without evidence of cervical or supraclavicular adenopathy. Lungs are clear to A&P. Cardiac examination is essentially unremarkable with regular rate and rhythm without murmur rub or thrill. Abdomen is benign with no organomegaly or masses noted. Motor sensory and DTR levels are equal and symmetric in the upper and lower extremities. Cranial nerves II through XII are grossly intact. Proprioception is intact. No peripheral adenopathy or edema is identified. No motor or sensory levels are noted. Crude visual fields are within normal range.  RADIOLOGY RESULTS: Serial CT scans and PET CT scans  reviewed  PLAN: Present time she has follow-up CT scans in September I will see her shortly after that.  Should the left upper lobe nodule be increasing may offer SBRT at that time.  Otherwise and pleased with her overall progress.  I will see her back again after her next CT scans.  Patient comprehends my recommendations well.  I would like to take this opportunity to thank you for allowing me to participate in the care of your patient.Noreene Filbert, MD

## 2020-05-17 ENCOUNTER — Other Ambulatory Visit: Payer: Self-pay

## 2020-05-17 ENCOUNTER — Other Ambulatory Visit: Payer: Self-pay | Admitting: Student in an Organized Health Care Education/Training Program

## 2020-05-17 ENCOUNTER — Other Ambulatory Visit: Payer: Medicare Other | Admitting: Nurse Practitioner

## 2020-05-17 ENCOUNTER — Encounter: Payer: Self-pay | Admitting: Nurse Practitioner

## 2020-05-17 DIAGNOSIS — C3490 Malignant neoplasm of unspecified part of unspecified bronchus or lung: Secondary | ICD-10-CM

## 2020-05-17 DIAGNOSIS — Z515 Encounter for palliative care: Secondary | ICD-10-CM

## 2020-05-17 NOTE — Progress Notes (Signed)
[facility] Palliative Care Consult Note Telephone: [phone]  Fax: [phone]  PATIENT NAME: Gloria Allen DOB: November 23, 1945 MRN: [id number]  PRIMARY CARE PROVIDER:   [name], MD  REFERRING PROVIDER:  [name], MD [address] [place],  [place] [address]  RESPONSIBLE PARTY:     Due to the COVID-19 crisis, this visit was done via telemedicine from my office and it was initiated and consent by this patient and or family.  RECOMMENDATIONS and PLAN: 1.ACP: DNR;  MOST form Gloria Allen did not feel like visiting and will revisit at next [facility] visit  2.Pain secondary to Malignant neoplasm of colon s/p partial resection with ileostomy/colostomy, small cell lung cancer s/p chemotherapy/radiationwill continue to monitor on pain scale, monitor efficacy vs adverse side effects.Continue with pain management appointment with Dr Holley Raring, keep appointment  3. Weight; continue to monitor weights, encourage comfort foods, supplements  4.Palliative care encounter Palliative medicine team will continue to support patient, patient's family, and medical team. Visit consisted of counseling and education dealing with the complex and emotionally intense issues of symptom management and palliative care in the setting of serious and potentially life-threatening illness  5. F/u visit 2 months or sooner if declines  I spent 60 minutes providing this consultation,  from 9:30am to 10:30am. More than 50% of the time in this consultation was spent coordinating communication.   HISTORY OF PRESENT ILLNESS:  Gloria Allen is a 74 y.o. year old female with multiple medical problems including Malignant neoplasm of colon s/p partial resection with ileostomy/colostomy, small cell lung cancer s/p chemotherapy/radiation, hypertension, gerd, headache, degenerative disc disease,asthma, arthritis, lumbar facet syndrome, lumbar disc protrusion, anxiety, depression,  PTSD, right shoulder arthroscopy, reduction mammoplasty, port-a-cath placement, right orif ankle, appendectomy, abdominal hysterectomy, D&C of uterus. In-person face-to-face visit changed to telemedicine per Gloria Allen request as she did not feel like having a visitor in her home today. Gloria Allen in agreement for telemedicine visit telephonic is video not available. We talked about purpose of palliative care visit. Gloria. Allen in agreement. We talked about how she has been feeling. Gloria Allen endorses that she does continue to have pain and it does make it more difficult. We talked about Gloria Allen appointment at Dr Holley Raring on 04/19/2020 following for pain. At that time her hydrocodone was increased to 10 mg three times a day as needed and hoped of optimizing her pain relief and follow up in 8 weeks. It was also discussed adding Lyrica to regiment to optimize pain, as well as an infection or Interventional therapy but Gloria Allen was not interested having needles placed in her back. Gloria Allen endorses that she also has developed more itching with the increase in the hydrocodone. Encourage her to contact Dr. Holley Raring office for further discussion concerning her pain regiment, itching. What time does a better appetite which is variable. She does go up and down on her way. Gloria Allen endorses her appetite taste for food is not always as she wishes it to be but she is trying to eat. Gloria Allen denies shortness of breath. We talked about medical goals of care. We talked about Gloria Allen appointment with Dr. Janese Banks Oncologist 03/14/2020 with review of scans  and 2.1 cm left lung mass present  with further discussion with Dr Donella Allen Radiation Oncologist for  left upper lobe mass radiation. Also was treated with augmentin empirically for  patchy ground glass opacity possible atypical infection.  It was  noted on CT abdomen a possible peritoneal node  that could possibly be fat necrosis on the Pet Scan  and will repeat scans in 3 months from that day  of visit. We talked about her visit with Dr Donella Allen on 7/21 / 2021 for 11 month follow-up status post concurrent chemoradiation therapy for limited stage small cell lung cancer. Recent CT demonstrated a new left upper lobe nodule on PET scan, remained asymptomatic with current weight 158.3 lbs. Plan as for follow-up scans in September and follow-up visit with Dr. Donella Allen after that with possibility should the left upper lobe nodule increase may offer SBRT at that time. We talked about since Gloria Allen appointment she has been doing okay. Gloria. Allen endorses taking one day at a time. Gloria. Allen is trying to orally hydrate as able to. Gloria Allen endorses she wants to treat what is treatable minimizing suffering. We talked about Gloria Allen family support system with her children. Gloria Allen endorses that her children helped her when she pays them to do that. Gloria Allen endorses that it is really hard and so she tries not to bother them. Gloria Allen endorses she went through her cancer treatments she was on her own. We talked about coping strategies and supportive system. We talked about role of palliative care and plan of care. We talked about follow-up palliative care visit in 2 month if needed or sooner should she declined. Gloria. Allen in agreement in appointment scheduled. Emotional support provided, therapeutic listening, questions answered to satisfaction. Contact information provided.  Palliative Care was asked to help to continue to address goals of care.   CODE STATUS: DNR  PPS: 50% HOSPICE ELIGIBILITY/DIAGNOSIS: TBD  PAST MEDICAL HISTORY:  Past Medical History:  Diagnosis Date  . Anxiety   . Arthritis   . Asthma   . Cervical central spinal stenosis (C3-C7) (worse at C4-5) 07/30/2017  . Cervical foraminal stenosis (C4-5 and C5-6) (Bilateral) (L>R) 07/30/2017  . Chronic lower extremity pain (Fourth Area of Pain) (Bilateral) (R>L) 06/11/2017  . Chronic neck pain (Primary Area of Pain) (Bilateral) (R>L) 06/11/2017   . Chronic sacroiliac joint pain (Bilateral) (R>L) 06/11/2017  . Chronic shoulder pain [facility] Area of Pain) (Right) 06/11/2017  . Chronic upper extremity pain (Fifth Area of Pain) (Bilateral) (R>L) 06/11/2017  . Colon cancer ([place]) 11/2018   Partial colon resection  . DDD (degenerative disc disease), cervical 07/30/2017  . DDD (degenerative disc disease), lumbar 07/30/2017  . Depression   . DISH (diffuse idiopathic skeletal hyperostosis) 07/30/2017  . Dyspnea    with exertion  . Entrapment syndrome 06/20/2017   2002 on R and 2010 on L  . Full thickness rotator cuff tear 06/12/2017  . GERD (gastroesophageal reflux disease)   . Grade 1 Anterolisthesis of L4 over L5 07/30/2017  . Headache   . Hx: UTI (urinary tract infection)   . Hypertension   . Inflammation of joint of shoulder region 01/15/2017  . Lumbar central spinal stenosis (L4-5) 07/30/2017  . Lumbar disc protrusion (Left: L5-S1) (Right: L1-2) 07/30/2017   L5-S1 left foraminal protrusion with L5 impingement. L1-2 right paracentral protrusion without impingement.  . Lumbar facet arthropathy (Bilateral) 07/30/2017  . Lumbar facet syndrome (Bilateral) (R>L) 07/30/2017  . Lung cancer ([place]) 11/2018   Chemo  and rad tx's  . Osteoarthritis of shoulder (Right) 01/15/2017    SOCIAL HX:  Social History   Tobacco Use  . Smoking status: Former Smoker    Packs/day: 1.00    Years: 15.00  Pack years: 15.00    Types: Cigarettes    Quit date: 2010    Years since quitting: 11.6  . Smokeless tobacco: Never Used  . Tobacco comment: quit over 30 years ago   Substance Use Topics  . Alcohol use: No    ALLERGIES:  Allergies  Allergen Reactions  . Ace Inhibitors Swelling  . Angiotensin Receptor Blockers Swelling  . Sulfa Antibiotics Itching  . Chlorthalidone Rash  . Flexeril [Cyclobenzaprine] Rash     PERTINENT MEDICATIONS:  Outpatient Encounter Medications as of 05/17/2020  Medication Sig  . amLODipine (NORVASC) 10 MG tablet Take  10 mg by mouth daily.  . chlorthalidone (HYGROTON) 25 MG tablet Take 25 mg by mouth daily.  . cloNIDine (CATAPRES) 0.3 MG tablet Take 0.3 mg by mouth 2 (two) times daily.  . DULoxetine (CYMBALTA) 30 MG capsule Take 30 mg by mouth daily.  . hydrALAZINE (APRESOLINE) 100 MG tablet Take 1 tablet (100 mg total) by mouth 3 (three) times daily.  Marland Kitchen HYDROcodone-acetaminophen (NORCO) 10-325 MG tablet Take 1 tablet by mouth every 8 (eight) hours as needed for severe pain. Must last 30 days.  Derrill Memo ON 05/26/2020] HYDROcodone-acetaminophen (NORCO) 10-325 MG tablet Take 1 tablet by mouth every 8 (eight) hours as needed for severe pain. Must last 30 days.  . hydrocortisone 2.5 % ointment Apply topically 2 (two) times daily.  Marland Kitchen lovastatin (MEVACOR) 40 MG tablet Take 1 tablet (40 mg total) by mouth at bedtime.  . meclizine (ANTIVERT) 12.5 MG tablet Take 1 tablet (12.5 mg total) by mouth 3 (three) times daily as needed for dizziness.  . megestrol (MEGACE) 20 MG tablet Take 1 tablet (20 mg total) by mouth daily.  Marland Kitchen MELATONIN/VITAMIN B-6 EX ST 5-1 MG TABS Take 1-2 tablets by mouth at bedtime as needed.  . metoprolol succinate (TOPROL-XL) 100 MG 24 hr tablet Take 100 mg by mouth daily.  . metroNIDAZOLE (FLAGYL) 500 MG tablet Take 500 mg by mouth 2 (two) times daily.   . mirtazapine (REMERON SOL-TAB) 15 MG disintegrating tablet Take 15 mg by mouth at bedtime.  . montelukast (SINGULAIR) 10 MG tablet Take 1 tablet (10 mg total) by mouth at bedtime as needed.  Marland Kitchen oxybutynin (DITROPAN-XL) 10 MG 24 hr tablet Take 10 mg by mouth daily.  . pregabalin (LYRICA) 25 MG capsule Take 1 capsule (25 mg total) by mouth at bedtime for 30 days, THEN 1 capsule (25 mg total) 2 (two) times daily.  . traZODone (DESYREL) 50 MG tablet Take 50 mg by mouth at bedtime.   Facility-Administered Encounter Medications as of 05/17/2020  Medication  . heparin lock flush 100 unit/mL  . heparin lock flush 100 unit/mL  . sodium chloride flush (NS)  0.9 % injection 10 mL  . sodium chloride flush (NS) 0.9 % injection 10 mL  . sodium chloride flush (NS) 0.9 % injection 10 mL  . sodium chloride flush (NS) 0.9 % injection 10 mL    PHYSICAL EXAM:   Deferred  Aster Eckrich Z Zaelynn Fuchs, NP

## 2020-06-01 DEATH — deceased

## 2020-06-15 ENCOUNTER — Ambulatory Visit (HOSPITAL_COMMUNITY): Payer: Medicare Other

## 2020-06-15 ENCOUNTER — Inpatient Hospital Stay: Payer: Medicare Other | Attending: Oncology

## 2020-06-15 ENCOUNTER — Ambulatory Visit: Admission: RE | Admit: 2020-06-15 | Payer: Medicare Other | Source: Ambulatory Visit

## 2020-06-17 ENCOUNTER — Inpatient Hospital Stay: Payer: Medicare Other | Admitting: Oncology

## 2020-06-21 ENCOUNTER — Encounter: Payer: Medicare Other | Admitting: Student in an Organized Health Care Education/Training Program

## 2020-06-30 ENCOUNTER — Ambulatory Visit: Payer: Medicare Other | Admitting: Radiation Oncology

## 2020-07-18 ENCOUNTER — Other Ambulatory Visit: Payer: Medicare Other | Admitting: Nurse Practitioner

## 2020-08-12 ENCOUNTER — Ambulatory Visit: Payer: Medicare Other | Admitting: Urology

## 2020-10-26 IMAGING — RF BE LIMITED WITH CONTRAST
1 series · 8 of 8 positions shown · IV contrast (agent unspecified)
Comparison: PET-CT, 02/05/2019

CLINICAL DATA: Rectal pain, s/p sigmoid resection with
reanastomosis and diverting ileostomy

EXAM:
BE LIMITED WITH CONTRAST
CONTRAST:  Single-contrast barium
FLUOROSCOPY TIME:  Fluoroscopy Time:  [DATE]
Number of Acquired Spot Images: 8

[Series 1: fluoro_barium singleshot_bw · 0.18mm/px · 8 of 8 slices shown]
[im 1/8]
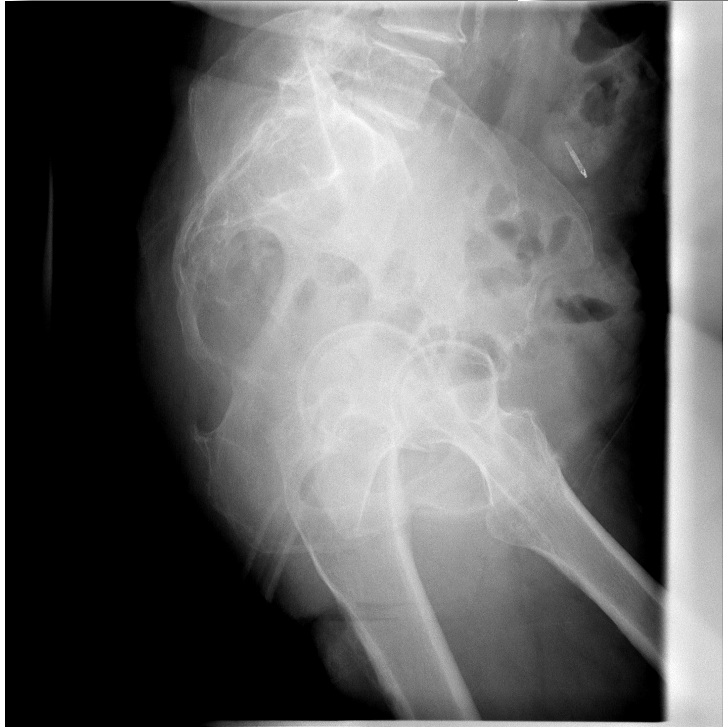
[im 2/8]
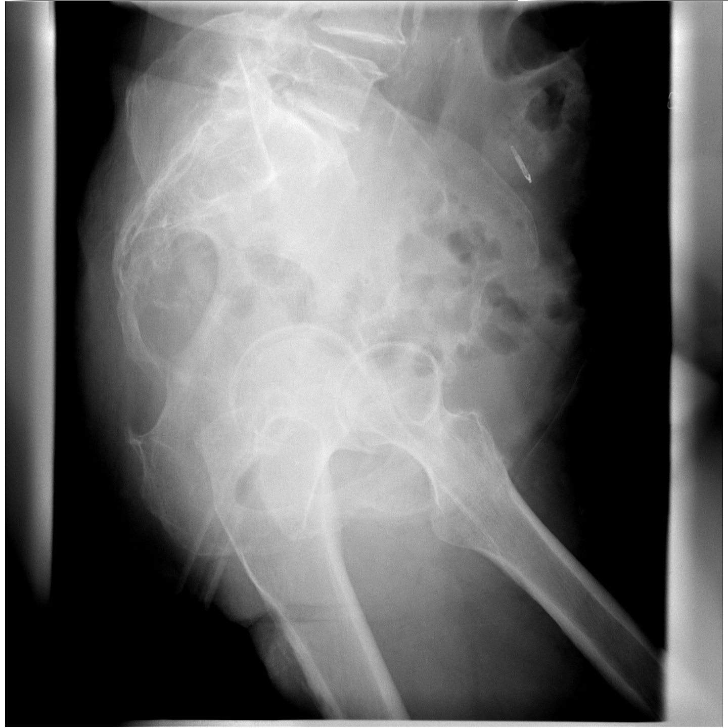
[im 3/8]
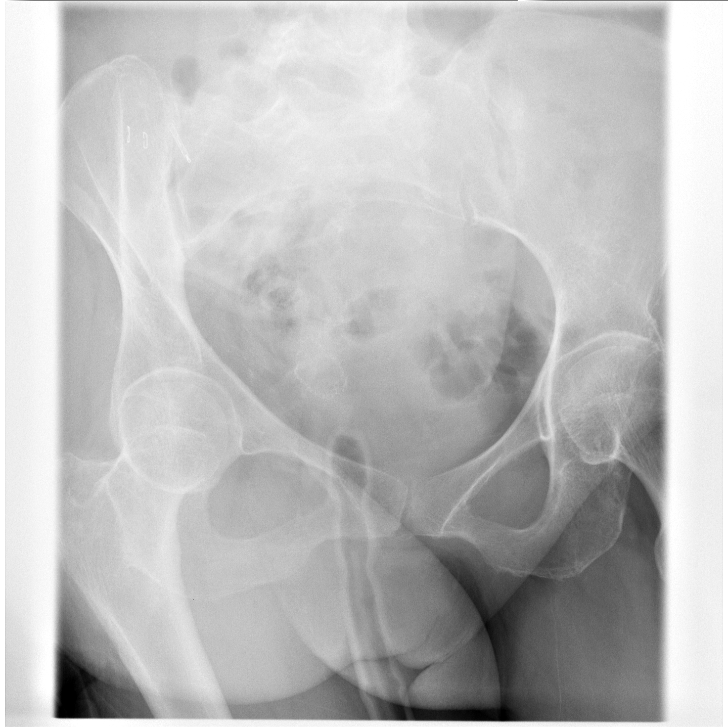
[im 4/8]
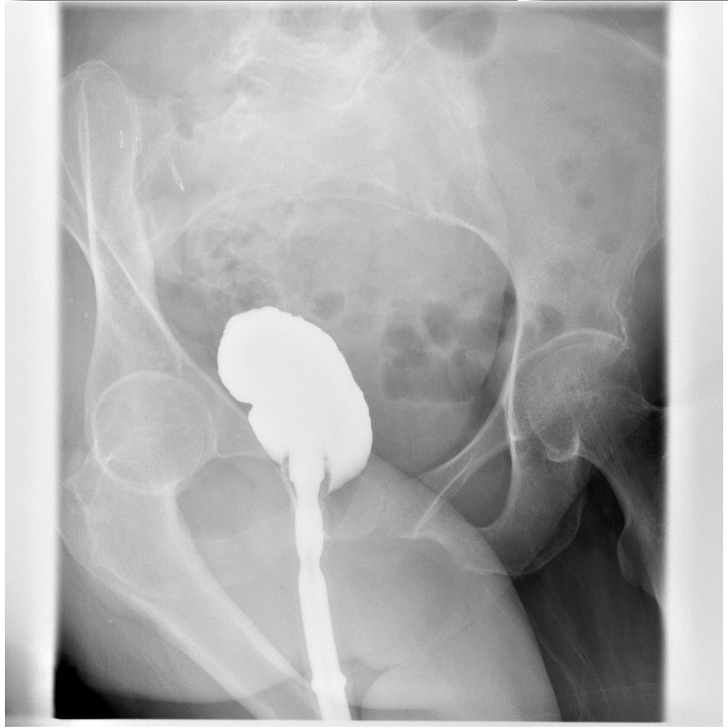
[im 5/8]
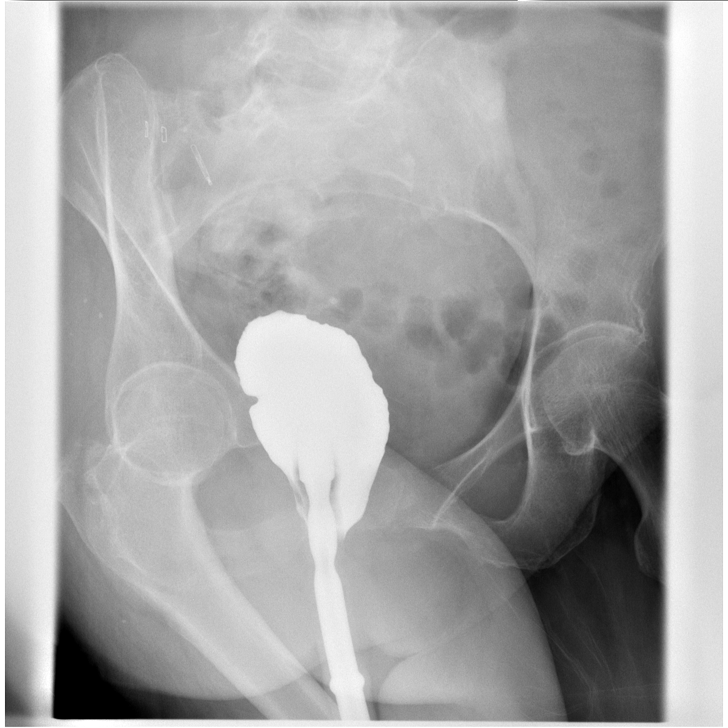
[im 6/8]
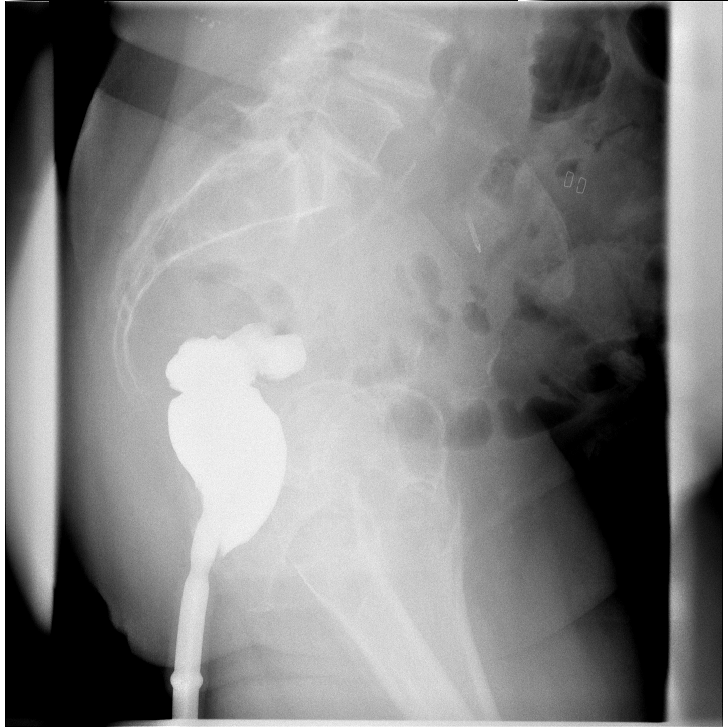
[im 7/8]
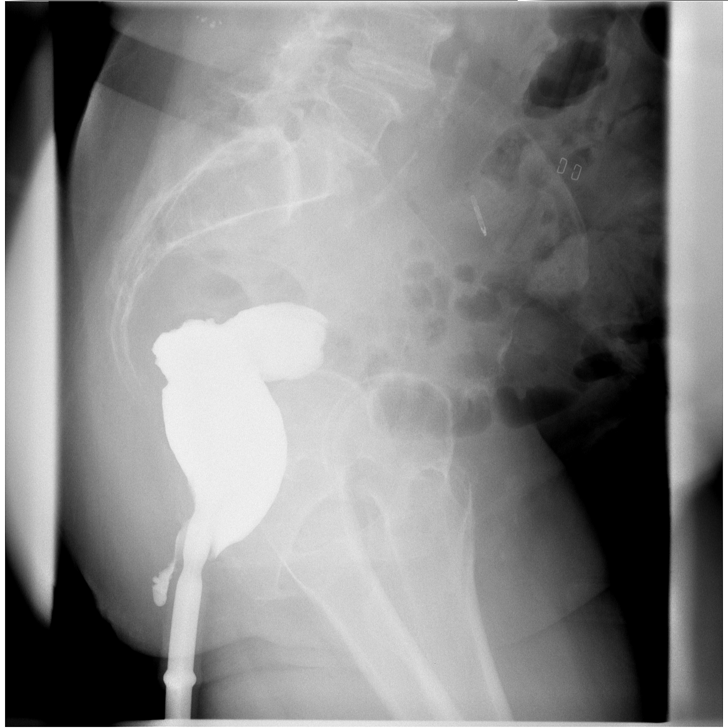
[im 8/8]
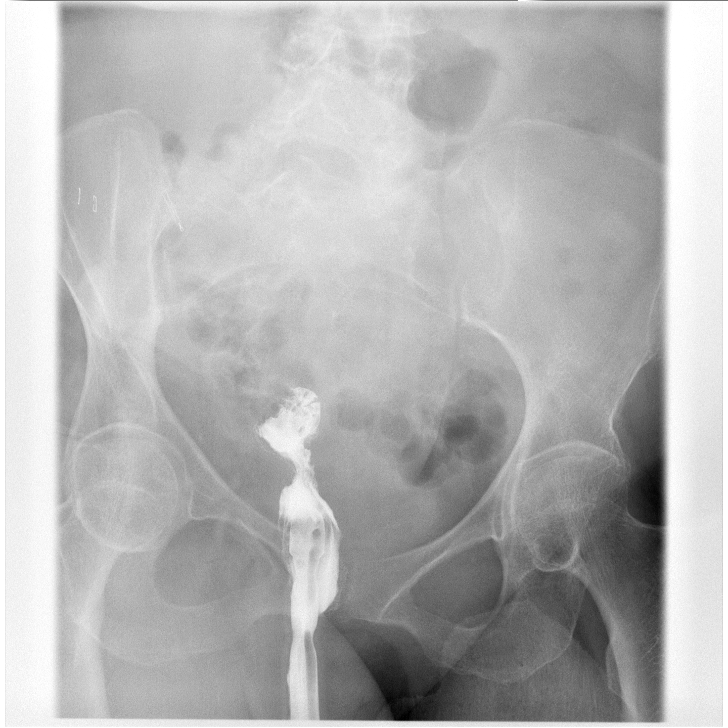

[8 of 8 positions shown; findings below may reference images not displayed]

FINDINGS: Following rectal cannulation and precontrast spot images, barium
contrast was administered through the rectal tube under gentle
gravitational pressure to the limit of patient tolerance and
catheter leakage. The rectum is contrast opacified to the
sigmorectal anastomosis and just proximal to the visualized sutures,
but does not pass further. No evidence of leak or fistula.
IMPRESSION: The rectum is contrast opacified to the sigmorectal anastomosis and
just proximal to the visualized sutures, but does not pass further.
There may be stenosis or adhesion in the vicinity of the
anastomosis. No evidence of leak or fistula.

## 2020-11-22 IMAGING — CT CT CHEST WITH CONTRAST
2 of 5 series · 12 of 36 positions shown, 15 images · IV contrast (omnipaque)
Comparison: PET-CT 02/05/2019

CLINICAL DATA: History of small cell lung cancer and sigmoid colon
cancer. Diagnosis January 2019. Currently undergoing chemotherapy and
radiation.

EXAM:
CT CHEST, ABDOMEN, AND PELVIS WITH CONTRAST
TECHNIQUE: Multidetector CT imaging of the chest, abdomen and pelvis was
performed following the standard protocol during bolus
administration of intravenous contrast.
CONTRAST:  100mL OMNIPAQUE IOHEXOL 300 MG/ML  SOLN

[Series 2: cap with · axial · 0.73mm/px · z∈[-672,-152]mm · 9 of 132 slices shown, 12 images]
[im 14/132  mediastinal]
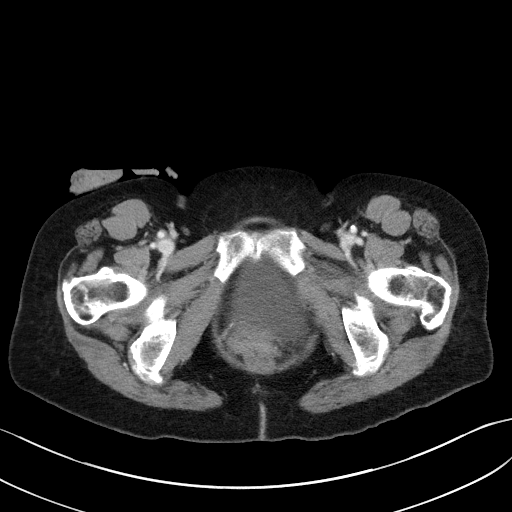
[im 14/132  lung]
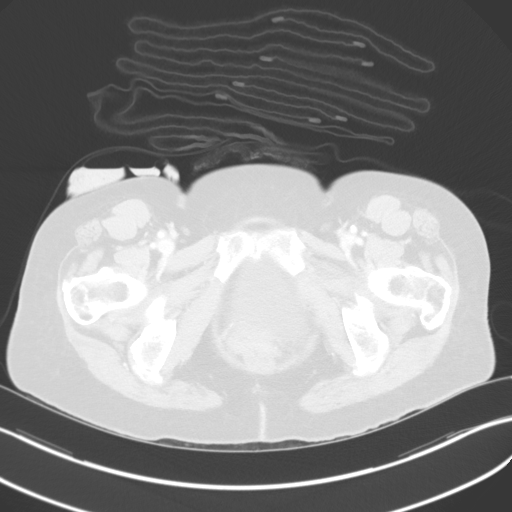
[im 27/132  lung]
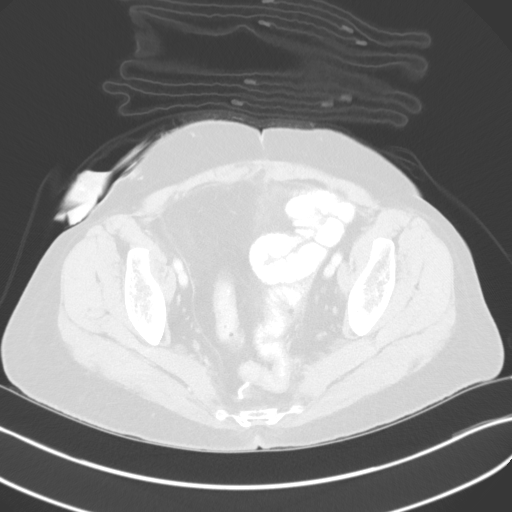
[im 40/132  lung]
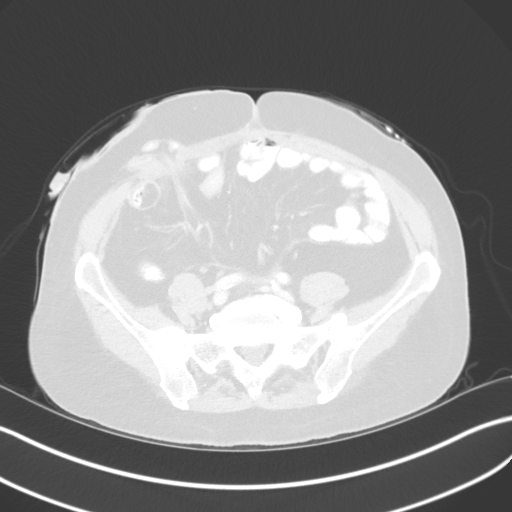
[im 53/132  lung]
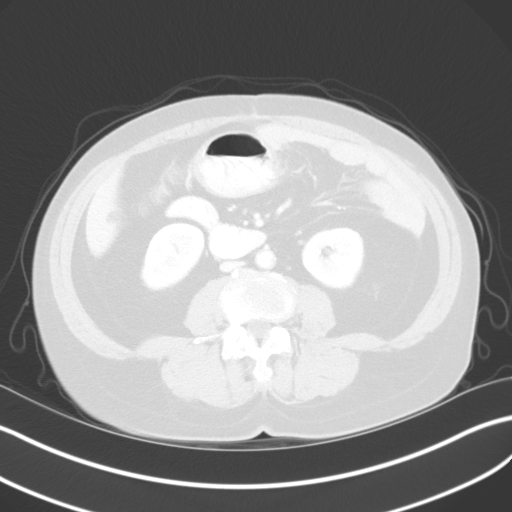
[im 66/132  mediastinal]
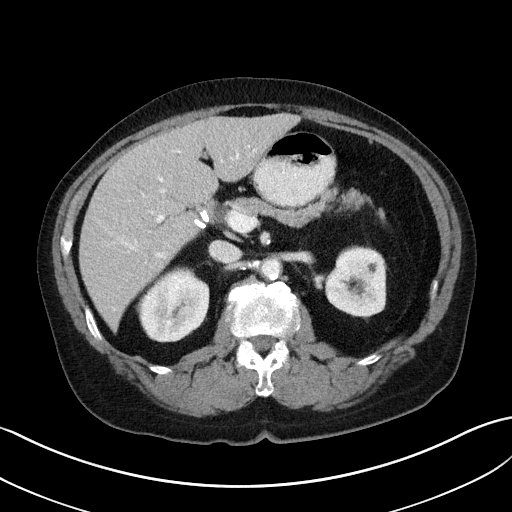
[im 66/132  lung]
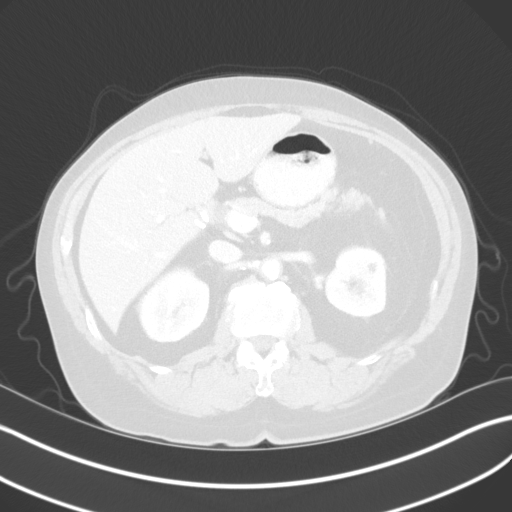
[im 79/132  lung]
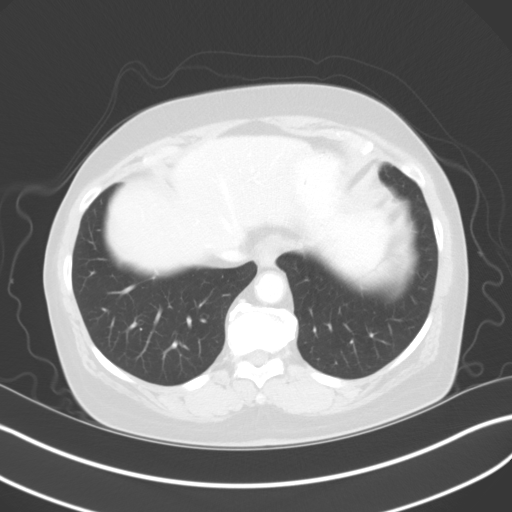
[im 92/132  lung]
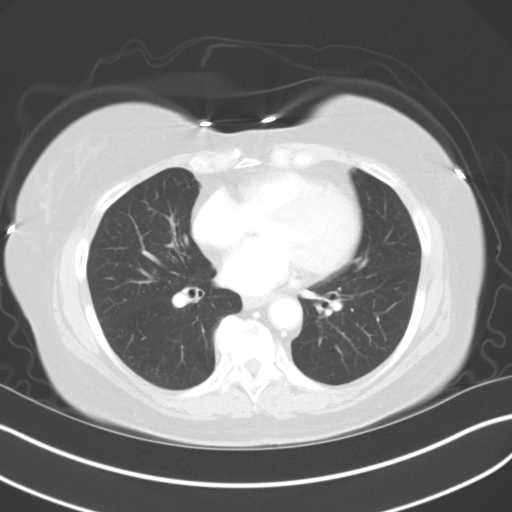
[im 105/132  lung]
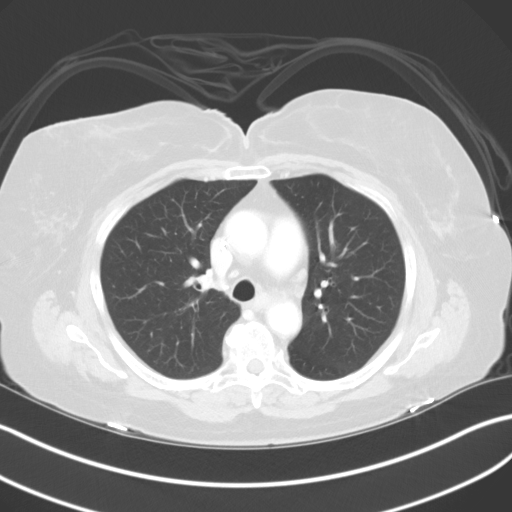
[im 118/132  mediastinal]
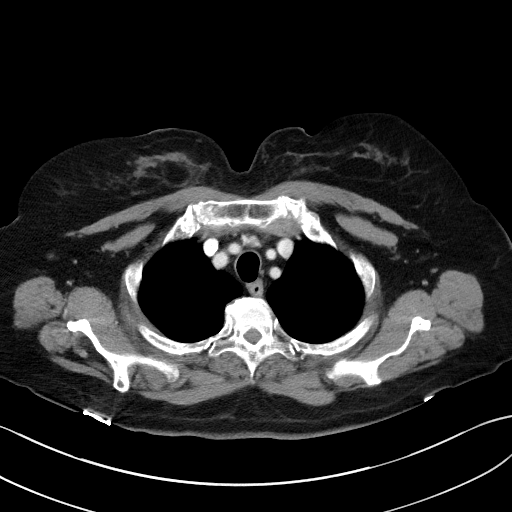
[im 118/132  lung]
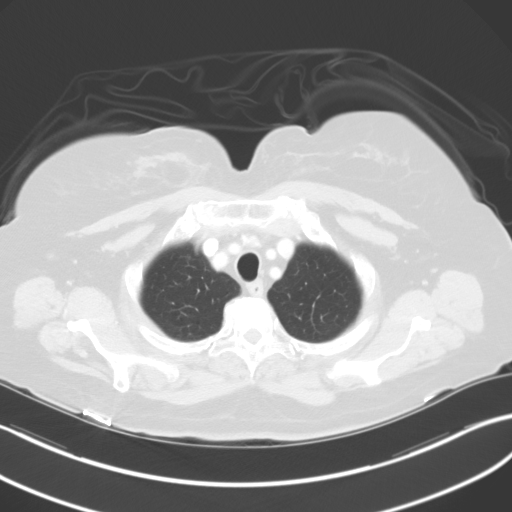

[Series 5: coronals · coronal · 0.65mm/px · 3 of 143 slices shown]
[im 29/143  lung]
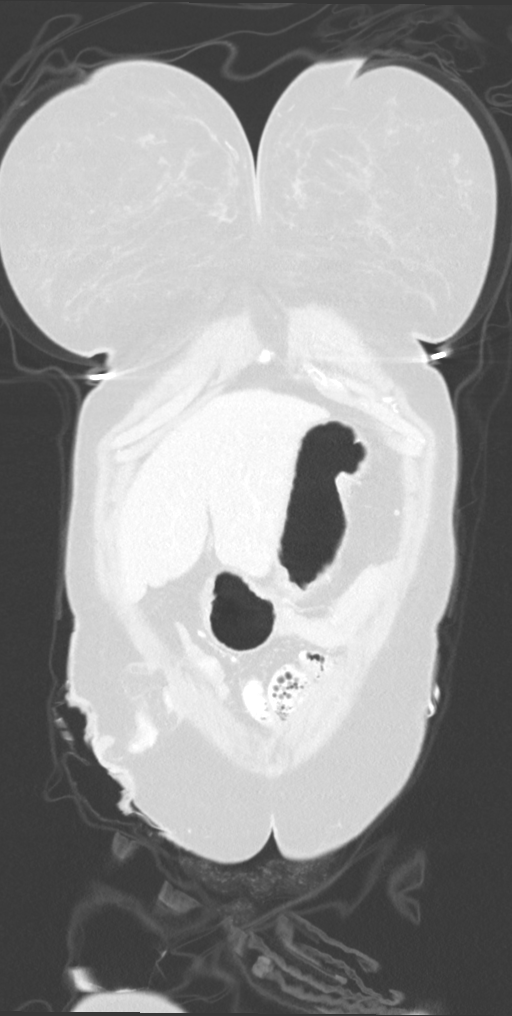
[im 57/143  lung]
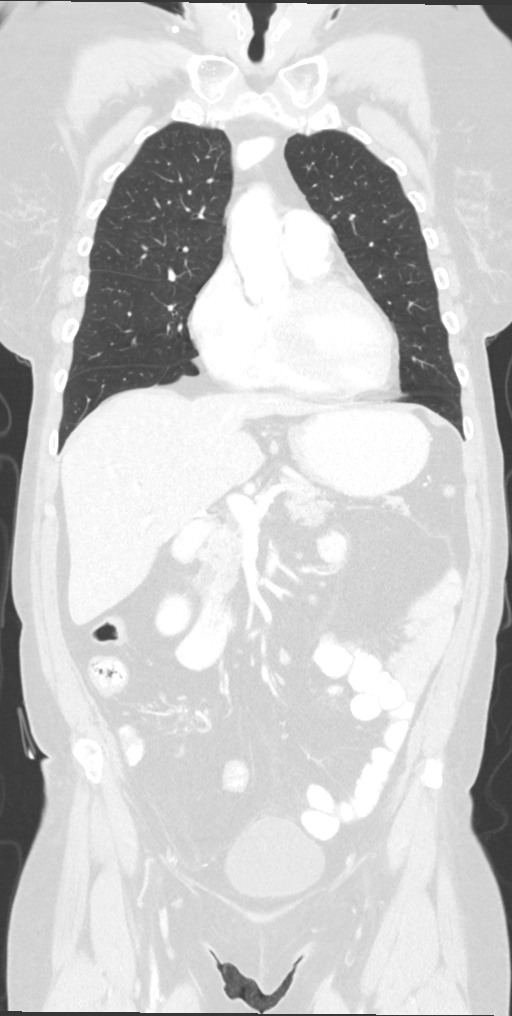
[im 86/143  lung]
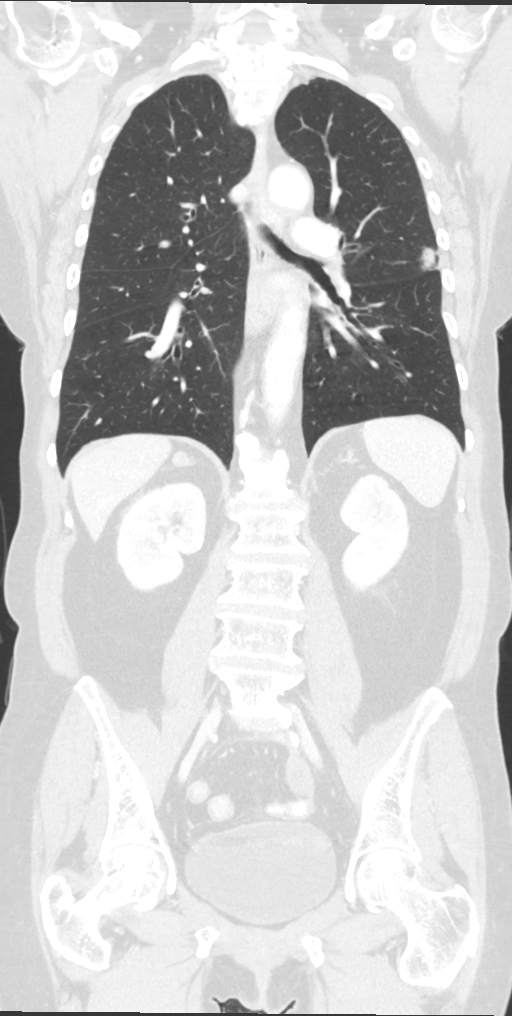

[12 of 36 positions shown; findings below may reference images not displayed]

FINDINGS: CT CHEST FINDINGS

Cardiovascular: The heart is normal in size. No pericardial
effusion. The aorta is normal in caliber. Moderate atherosclerotic
calcifications. No dissection. The branch vessels are patent. Stable
coronary artery calcifications.

Mediastinum/Nodes: No mediastinal or hilar mass or lymphadenopathy.
The esophagus is unremarkable.

Lungs/Pleura: Stable underlying emphysematous changes.

The right upper lobe pulmonary lesion measures 7.5 mm on image
number 58 and previously measured 15.5 mm.

Semi solid nodular left upper lobe lesion peripherally on image
number 85 measures 18 mm and is unchanged. This was not
hypermetabolic on the recent PET-CT but recommend continued
observation/surveillance.

A few tiny scattered subpleural nodules are stable and likely
represent lymph nodes.

No infiltrates or effusions.

Musculoskeletal: No breast masses, supraclavicular or axillary
adenopathy. The right-sided Port-A-Cath is stable. The thyroid gland
is unremarkable. Stable small right nodule.

No significant bony findings.

CT ABDOMEN PELVIS FINDINGS

Hepatobiliary: Stable low-attenuation lesion in segment 6 most
consistent with a benign hepatic cyst. No worrisome hepatic lesions
or intrahepatic biliary dilatation. The gallbladder is surgically
absent. Mild associated common bile duct dilatation.

Pancreas: No mass, inflammation or ductal dilatation.

Spleen: Normal size.  No focal lesions.

Adrenals/Urinary Tract: The adrenal glands are normal in stable.

No renal, ureteral or bladder calculi or mass.

Stomach/Bowel: The stomach, duodenum, small bowel and colon are
unremarkable. No acute inflammatory changes, mass lesions or
obstructive findings. Patient has a loop ileostomy in the right
lower quadrant. Prior left hemicolectomy with transverse colon to
rectal anastomosis. There is dense contrast in the rectum with
moderate artifact but no findings suspicious for residual or
recurrent tumor.

Vascular/Lymphatic: Stable aortic and iliac artery calcifications
but no aneurysm. The major venous structures are patent. No
mesenteric or retroperitoneal mass or adenopathy. No pelvic
lymphadenopathy.

Reproductive: The uterus is surgically absent. Both ovaries are
still present and appear normal. There is a simple appearing cyst
associated with the left ovary.

Other: No free pelvic fluid collections or pelvic abscess. No
inguinal mass or adenopathy. No subcutaneous lesions.

Musculoskeletal: No significant bony findings.
IMPRESSION: 1. Interval decrease in size of the right upper lobe pulmonary
nodule now measuring 7.5 mm and previously measuring 15.5 mm.
2. Stable 18 mm semi-solid airspace nodule in the left upper lobe.
Recommend continued surveillance.
3. No mediastinal or hilar mass or adenopathy.
4. Stable surgical changes involving the colon. No findings
suspicious for recurrent tumor, locoregional adenopathy or distant
metastatic disease.

## 2022-12-29 NOTE — Telephone Encounter (Signed)
Pt deceased and trying to close out notw
